# Patient Record
Sex: Male | Born: 1955 | Race: White | Hispanic: No | Marital: Married | Smoking: Never smoker
Health system: Southern US, Community
[De-identification: ages and names within clinical notes are randomized; demographics above are authoritative.]

## PROBLEM LIST (undated history)

## (undated) DIAGNOSIS — I82409 Acute embolism and thrombosis of unspecified deep veins of unspecified lower extremity: Secondary | ICD-10-CM

## (undated) DIAGNOSIS — I5042 Chronic combined systolic (congestive) and diastolic (congestive) heart failure: Secondary | ICD-10-CM

## (undated) DIAGNOSIS — Z95810 Presence of automatic (implantable) cardiac defibrillator: Secondary | ICD-10-CM

## (undated) DIAGNOSIS — J45909 Unspecified asthma, uncomplicated: Secondary | ICD-10-CM

## (undated) DIAGNOSIS — J8410 Pulmonary fibrosis, unspecified: Secondary | ICD-10-CM

## (undated) DIAGNOSIS — E119 Type 2 diabetes mellitus without complications: Secondary | ICD-10-CM

## (undated) DIAGNOSIS — I219 Acute myocardial infarction, unspecified: Secondary | ICD-10-CM

## (undated) DIAGNOSIS — J849 Interstitial pulmonary disease, unspecified: Secondary | ICD-10-CM

## (undated) DIAGNOSIS — N1832 Chronic kidney disease, stage 3b: Secondary | ICD-10-CM

## (undated) DIAGNOSIS — K5790 Diverticulosis of intestine, part unspecified, without perforation or abscess without bleeding: Secondary | ICD-10-CM

## (undated) DIAGNOSIS — F419 Anxiety disorder, unspecified: Secondary | ICD-10-CM

## (undated) DIAGNOSIS — I2510 Atherosclerotic heart disease of native coronary artery without angina pectoris: Secondary | ICD-10-CM

## (undated) DIAGNOSIS — D126 Benign neoplasm of colon, unspecified: Secondary | ICD-10-CM

## (undated) DIAGNOSIS — N189 Chronic kidney disease, unspecified: Secondary | ICD-10-CM

## (undated) DIAGNOSIS — E785 Hyperlipidemia, unspecified: Secondary | ICD-10-CM

## (undated) DIAGNOSIS — I4901 Ventricular fibrillation: Secondary | ICD-10-CM

## (undated) DIAGNOSIS — E669 Obesity, unspecified: Secondary | ICD-10-CM

## (undated) DIAGNOSIS — I10 Essential (primary) hypertension: Secondary | ICD-10-CM

## (undated) DIAGNOSIS — I509 Heart failure, unspecified: Secondary | ICD-10-CM

## (undated) HISTORY — PX: CORONARY ARTERY BYPASS GRAFT: SHX141

## (undated) HISTORY — DX: Anxiety disorder, unspecified: F41.9

## (undated) HISTORY — DX: Unspecified asthma, uncomplicated: J45.909

## (undated) HISTORY — DX: Benign neoplasm of colon, unspecified: D12.6

## (undated) HISTORY — DX: Diverticulosis of intestine, part unspecified, without perforation or abscess without bleeding: K57.90

## (undated) HISTORY — DX: Hyperlipidemia, unspecified: E78.5

## (undated) HISTORY — DX: Obesity, unspecified: E66.9

## (undated) HISTORY — PX: STERNECTOMY: SHX2443

## (undated) HISTORY — DX: Ventricular fibrillation: I49.01

## (undated) HISTORY — DX: Atherosclerotic heart disease of native coronary artery without angina pectoris: I25.10

## (undated) HISTORY — DX: Heart failure, unspecified: I50.9

## (undated) HISTORY — DX: Essential (primary) hypertension: I10

## (undated) HISTORY — DX: Acute myocardial infarction, unspecified: I21.9

---

## 2004-07-29 HISTORY — PX: CORONARY ARTERY BYPASS GRAFT: SHX141

## 2006-07-29 HISTORY — PX: MASS EXCISION: SHX2000

## 2010-07-29 DIAGNOSIS — I219 Acute myocardial infarction, unspecified: Secondary | ICD-10-CM

## 2010-07-29 HISTORY — DX: Acute myocardial infarction, unspecified: I21.9

## 2010-07-29 HISTORY — PX: IMPLANTABLE CARDIOVERTER DEFIBRILLATOR IMPLANT: SHX5860

## 2010-09-06 ENCOUNTER — Ambulatory Visit (HOSPITAL_COMMUNITY): Payer: BC Managed Care – PPO

## 2010-09-06 ENCOUNTER — Inpatient Hospital Stay (HOSPITAL_COMMUNITY): Payer: BC Managed Care – PPO

## 2010-09-06 ENCOUNTER — Inpatient Hospital Stay (HOSPITAL_COMMUNITY)
Admission: EM | Admit: 2010-09-06 | Discharge: 2010-09-12 | DRG: 549 | Disposition: A | Discharge status: Home / Self Care | Payer: BC Managed Care – PPO | Source: Ambulatory Visit | Attending: Internal Medicine | Admitting: Internal Medicine

## 2010-09-06 DIAGNOSIS — S0180XA Unspecified open wound of other part of head, initial encounter: Secondary | ICD-10-CM | POA: Diagnosis present

## 2010-09-06 DIAGNOSIS — I509 Heart failure, unspecified: Secondary | ICD-10-CM

## 2010-09-06 DIAGNOSIS — I4901 Ventricular fibrillation: Secondary | ICD-10-CM | POA: Diagnosis present

## 2010-09-06 DIAGNOSIS — G931 Anoxic brain damage, not elsewhere classified: Secondary | ICD-10-CM | POA: Diagnosis present

## 2010-09-06 DIAGNOSIS — E876 Hypokalemia: Secondary | ICD-10-CM | POA: Diagnosis present

## 2010-09-06 DIAGNOSIS — R570 Cardiogenic shock: Secondary | ICD-10-CM

## 2010-09-06 DIAGNOSIS — Z7982 Long term (current) use of aspirin: Secondary | ICD-10-CM

## 2010-09-06 DIAGNOSIS — J9600 Acute respiratory failure, unspecified whether with hypoxia or hypercapnia: Secondary | ICD-10-CM

## 2010-09-06 DIAGNOSIS — R0902 Hypoxemia: Secondary | ICD-10-CM

## 2010-09-06 DIAGNOSIS — E119 Type 2 diabetes mellitus without complications: Secondary | ICD-10-CM | POA: Diagnosis present

## 2010-09-06 DIAGNOSIS — E785 Hyperlipidemia, unspecified: Secondary | ICD-10-CM | POA: Diagnosis present

## 2010-09-06 DIAGNOSIS — Z951 Presence of aortocoronary bypass graft: Secondary | ICD-10-CM

## 2010-09-06 DIAGNOSIS — I2119 ST elevation (STEMI) myocardial infarction involving other coronary artery of inferior wall: Principal | ICD-10-CM | POA: Diagnosis present

## 2010-09-06 DIAGNOSIS — I2510 Atherosclerotic heart disease of native coronary artery without angina pectoris: Secondary | ICD-10-CM | POA: Diagnosis present

## 2010-09-06 DIAGNOSIS — I10 Essential (primary) hypertension: Secondary | ICD-10-CM | POA: Diagnosis present

## 2010-09-06 DIAGNOSIS — I469 Cardiac arrest, cause unspecified: Secondary | ICD-10-CM | POA: Diagnosis present

## 2010-09-06 DIAGNOSIS — E669 Obesity, unspecified: Secondary | ICD-10-CM | POA: Diagnosis present

## 2010-09-06 DIAGNOSIS — W19XXXA Unspecified fall, initial encounter: Secondary | ICD-10-CM | POA: Diagnosis present

## 2010-09-06 LAB — LIPID PANEL
Cholesterol: 153 mg/dL (ref 0–200)
HDL: 27 mg/dL — ABNORMAL LOW (ref >39)
LDL Cholesterol: 63 mg/dL (ref 0–99)
Triglycerides: 317 mg/dL — ABNORMAL HIGH (ref <150)

## 2010-09-06 LAB — URINE MICROSCOPIC-ADD ON

## 2010-09-06 LAB — COMPREHENSIVE METABOLIC PANEL
AST: 188 U/L — ABNORMAL HIGH (ref 0–37)
BUN: 16 mg/dL (ref 6–23)
CO2: 20 mEq/L (ref 19–32)
Calcium: 8.6 mg/dL (ref 8.4–10.5)
Chloride: 101 mEq/L (ref 96–112)
Creatinine, Ser: 1.11 mg/dL (ref 0.4–1.5)
GFR calc non Af Amer: >60 mL/min (ref >60)
Glucose, Bld: 403 mg/dL — ABNORMAL HIGH (ref 70–99)
Total Bilirubin: 1.1 mg/dL (ref 0.3–1.2)

## 2010-09-06 LAB — BASIC METABOLIC PANEL
BUN: 14 mg/dL (ref 6–23)
CO2: 18 mEq/L — ABNORMAL LOW (ref 19–32)
CO2: 18 mEq/L — ABNORMAL LOW (ref 19–32)
Calcium: 8.4 mg/dL (ref 8.4–10.5)
Calcium: 8.5 mg/dL (ref 8.4–10.5)
Chloride: 103 mEq/L (ref 96–112)
Chloride: 106 mEq/L (ref 96–112)
Creatinine, Ser: 1 mg/dL (ref 0.4–1.5)
Creatinine, Ser: 0.97 mg/dL (ref 0.4–1.5)
GFR calc Af Amer: >60 mL/min (ref >60)
Glucose, Bld: 325 mg/dL — ABNORMAL HIGH (ref 70–99)
Glucose, Bld: 335 mg/dL — ABNORMAL HIGH (ref 70–99)
Potassium: 3.8 mEq/L (ref 3.5–5.1)
Sodium: 136 mEq/L (ref 135–145)

## 2010-09-06 LAB — URINALYSIS, ROUTINE W REFLEX MICROSCOPIC
Bilirubin Urine: NEGATIVE
Protein, ur: 100 mg/dL — AB
Urine Glucose, Fasting: >1000 mg/dL — AB

## 2010-09-06 LAB — STREP PNEUMONIAE URINARY ANTIGEN: Strep Pneumo Urinary Antigen: NEGATIVE

## 2010-09-06 LAB — CBC
HCT: 39.2 % (ref 39.0–52.0)
Hemoglobin: 13.9 g/dL (ref 13.0–17.0)
MCHC: 35.5 g/dL (ref 30.0–36.0)
MCV: 82.5 fL (ref 78.0–100.0)

## 2010-09-06 LAB — PROTIME-INR
INR: 1.01 (ref 0.00–1.49)
Prothrombin Time: 13.5 seconds (ref 11.6–15.2)

## 2010-09-06 LAB — BRAIN NATRIURETIC PEPTIDE: Pro B Natriuretic peptide (BNP): <30 pg/mL (ref 0.0–100.0)

## 2010-09-06 LAB — CARDIAC PANEL(CRET KIN+CKTOT+MB+TROPI)
Relative Index: INVALID (ref 0.0–2.5)
Troponin I: 0.22 ng/mL — ABNORMAL HIGH (ref 0.00–0.06)

## 2010-09-07 ENCOUNTER — Inpatient Hospital Stay (HOSPITAL_COMMUNITY): Payer: BC Managed Care – PPO

## 2010-09-07 LAB — BASIC METABOLIC PANEL
BUN: 12 mg/dL (ref 6–23)
CO2: 17 mEq/L — ABNORMAL LOW (ref 19–32)
CO2: 18 mEq/L — ABNORMAL LOW (ref 19–32)
Calcium: 8.9 mg/dL (ref 8.4–10.5)
Calcium: 8.5 mg/dL (ref 8.4–10.5)
Chloride: 108 mEq/L (ref 96–112)
Chloride: 109 mEq/L (ref 96–112)
Chloride: 111 mEq/L (ref 96–112)
Chloride: 115 mEq/L — ABNORMAL HIGH (ref 96–112)
Creatinine, Ser: 0.5 mg/dL (ref 0.4–1.5)
Creatinine, Ser: 0.63 mg/dL (ref 0.4–1.5)
Creatinine, Ser: 0.74 mg/dL (ref 0.4–1.5)
GFR calc Af Amer: >60 mL/min (ref >60)
GFR calc Af Amer: >60 mL/min (ref >60)
GFR calc Af Amer: >60 mL/min (ref >60)
GFR calc Af Amer: >60 mL/min (ref >60)
GFR calc non Af Amer: >60 mL/min (ref >60)
GFR calc non Af Amer: >60 mL/min (ref >60)
GFR calc non Af Amer: >60 mL/min (ref >60)
Glucose, Bld: 199 mg/dL — ABNORMAL HIGH (ref 70–99)
Glucose, Bld: 215 mg/dL — ABNORMAL HIGH (ref 70–99)
Glucose, Bld: 133 mg/dL — ABNORMAL HIGH (ref 70–99)
Potassium: 3.4 mEq/L — ABNORMAL LOW (ref 3.5–5.1)
Potassium: 3.6 mEq/L (ref 3.5–5.1)
Potassium: 3.8 mEq/L (ref 3.5–5.1)
Sodium: 141 mEq/L (ref 135–145)
Sodium: 139 mEq/L (ref 135–145)
Sodium: 141 mEq/L (ref 135–145)

## 2010-09-07 LAB — POCT I-STAT 3, ART BLOOD GAS (G3+)
Acid-base deficit: 6 mmol/L — ABNORMAL HIGH (ref 0.0–2.0)
Acid-base deficit: 4 mmol/L — ABNORMAL HIGH (ref 0.0–2.0)
Bicarbonate: 20 mEq/L (ref 20.0–24.0)
Bicarbonate: 18.1 mEq/L — ABNORMAL LOW (ref 20.0–24.0)
O2 Saturation: 100 %
O2 Saturation: 100 %
O2 Saturation: 100 %
O2 Saturation: 100 %
O2 Saturation: 97 %
Patient temperature: 34.4
Patient temperature: 32.6
TCO2: 21 mmol/L (ref 0–100)
TCO2: 19 mmol/L (ref 0–100)
pCO2 arterial: 40.9 mmHg (ref 35.0–45.0)
pCO2 arterial: 32.8 mmHg — ABNORMAL LOW (ref 35.0–45.0)
pCO2 arterial: 26.4 mmHg — ABNORMAL LOW (ref 35.0–45.0)
pH, Arterial: 7.355 (ref 7.350–7.450)

## 2010-09-07 LAB — GLUCOSE, CAPILLARY
Glucose-Capillary: 247 mg/dL — ABNORMAL HIGH (ref 70–99)
Glucose-Capillary: 274 mg/dL — ABNORMAL HIGH (ref 70–99)
Glucose-Capillary: 275 mg/dL — ABNORMAL HIGH (ref 70–99)
Glucose-Capillary: 296 mg/dL — ABNORMAL HIGH (ref 70–99)
Glucose-Capillary: 317 mg/dL — ABNORMAL HIGH (ref 70–99)
Glucose-Capillary: 351 mg/dL — ABNORMAL HIGH (ref 70–99)
Glucose-Capillary: 121 mg/dL — ABNORMAL HIGH (ref 70–99)
Glucose-Capillary: 133 mg/dL — ABNORMAL HIGH (ref 70–99)
Glucose-Capillary: 156 mg/dL — ABNORMAL HIGH (ref 70–99)
Glucose-Capillary: 162 mg/dL — ABNORMAL HIGH (ref 70–99)
Glucose-Capillary: 198 mg/dL — ABNORMAL HIGH (ref 70–99)

## 2010-09-07 LAB — BLOOD GAS, ARTERIAL
MECHVT: 490 mL
O2 Saturation: 96.5 %
PEEP: 5 cmH2O
Patient temperature: 91.4
pH, Arterial: 7.57 — ABNORMAL HIGH (ref 7.350–7.450)

## 2010-09-07 LAB — POCT I-STAT, CHEM 8
Calcium, Ion: 1.19 mmol/L (ref 1.12–1.32)
Creatinine, Ser: 0.9 mg/dL (ref 0.4–1.5)
Hemoglobin: 16 g/dL (ref 13.0–17.0)
Sodium: 138 mEq/L (ref 135–145)
TCO2: 17 mmol/L (ref 0–100)

## 2010-09-07 LAB — CBC
HCT: 40.7 % (ref 39.0–52.0)
Hemoglobin: 14.5 g/dL (ref 13.0–17.0)
MCH: 29 pg (ref 26.0–34.0)
MCHC: 35.6 g/dL (ref 30.0–36.0)
RDW: 13.1 % (ref 11.5–15.5)

## 2010-09-07 LAB — HEMOGLOBIN A1C
Hgb A1c MFr Bld: 9.2 % — ABNORMAL HIGH (ref <5.7)
Mean Plasma Glucose: 217 mg/dL — ABNORMAL HIGH (ref <117)

## 2010-09-07 LAB — CARDIAC PANEL(CRET KIN+CKTOT+MB+TROPI)
CK, MB: 42.6 ng/mL (ref 0.3–4.0)
Relative Index: 16 — ABNORMAL HIGH (ref 0.0–2.5)
Total CK: 267 U/L — ABNORMAL HIGH (ref 7–232)
Total CK: 279 U/L — ABNORMAL HIGH (ref 7–232)
Troponin I: 1.43 ng/mL (ref 0.00–0.06)

## 2010-09-07 LAB — LEGIONELLA ANTIGEN, URINE

## 2010-09-07 LAB — LIPID PANEL
Triglycerides: 205 mg/dL — ABNORMAL HIGH (ref <150)
VLDL: 41 mg/dL — ABNORMAL HIGH (ref 0–40)

## 2010-09-07 LAB — MAGNESIUM: Magnesium: 1.7 mg/dL (ref 1.5–2.5)

## 2010-09-07 LAB — APTT: aPTT: 25 seconds (ref 24–37)

## 2010-09-08 ENCOUNTER — Inpatient Hospital Stay (HOSPITAL_COMMUNITY): Payer: BC Managed Care – PPO

## 2010-09-08 DIAGNOSIS — I469 Cardiac arrest, cause unspecified: Secondary | ICD-10-CM

## 2010-09-08 LAB — GLUCOSE, CAPILLARY
Glucose-Capillary: 116 mg/dL — ABNORMAL HIGH (ref 70–99)
Glucose-Capillary: 126 mg/dL — ABNORMAL HIGH (ref 70–99)
Glucose-Capillary: 129 mg/dL — ABNORMAL HIGH (ref 70–99)
Glucose-Capillary: 141 mg/dL — ABNORMAL HIGH (ref 70–99)
Glucose-Capillary: 153 mg/dL — ABNORMAL HIGH (ref 70–99)
Glucose-Capillary: 159 mg/dL — ABNORMAL HIGH (ref 70–99)
Glucose-Capillary: 159 mg/dL — ABNORMAL HIGH (ref 70–99)
Glucose-Capillary: 179 mg/dL — ABNORMAL HIGH (ref 70–99)

## 2010-09-08 LAB — BASIC METABOLIC PANEL
BUN: 10 mg/dL (ref 6–23)
BUN: 11 mg/dL (ref 6–23)
CO2: 21 mEq/L (ref 19–32)
CO2: 21 mEq/L (ref 19–32)
Calcium: 7.8 mg/dL — ABNORMAL LOW (ref 8.4–10.5)
Chloride: 114 mEq/L — ABNORMAL HIGH (ref 96–112)
Chloride: 115 mEq/L — ABNORMAL HIGH (ref 96–112)
Chloride: 116 mEq/L — ABNORMAL HIGH (ref 96–112)
Chloride: 117 mEq/L — ABNORMAL HIGH (ref 96–112)
Creatinine, Ser: 0.76 mg/dL (ref 0.4–1.5)
GFR calc Af Amer: >60 mL/min (ref >60)
GFR calc Af Amer: >60 mL/min (ref >60)
Glucose, Bld: 174 mg/dL — ABNORMAL HIGH (ref 70–99)
Potassium: 3.3 mEq/L — ABNORMAL LOW (ref 3.5–5.1)
Potassium: 4.3 mEq/L (ref 3.5–5.1)
Sodium: 143 mEq/L (ref 135–145)
Sodium: 143 mEq/L (ref 135–145)
Sodium: 143 mEq/L (ref 135–145)

## 2010-09-08 LAB — CBC
Hemoglobin: 12.5 g/dL — ABNORMAL LOW (ref 13.0–17.0)
RBC: 4.39 MIL/uL (ref 4.22–5.81)

## 2010-09-08 LAB — URINE CULTURE: Culture  Setup Time: 201202100417

## 2010-09-08 LAB — CARDIAC PANEL(CRET KIN+CKTOT+MB+TROPI)
CK, MB: 25.3 ng/mL (ref 0.3–4.0)
Total CK: 136 U/L (ref 7–232)

## 2010-09-08 LAB — POCT I-STAT 3, ART BLOOD GAS (G3+)
pCO2 arterial: 33.2 mmHg — ABNORMAL LOW (ref 35.0–45.0)
pO2, Arterial: 105 mmHg — ABNORMAL HIGH (ref 80.0–100.0)

## 2010-09-09 ENCOUNTER — Inpatient Hospital Stay (HOSPITAL_COMMUNITY): Payer: BC Managed Care – PPO

## 2010-09-09 LAB — GLUCOSE, CAPILLARY
Glucose-Capillary: 134 mg/dL — ABNORMAL HIGH (ref 70–99)
Glucose-Capillary: 123 mg/dL — ABNORMAL HIGH (ref 70–99)

## 2010-09-09 LAB — CBC
HCT: 32.9 % — ABNORMAL LOW (ref 39.0–52.0)
MCH: 28.1 pg (ref 26.0–34.0)
MCHC: 32.8 g/dL (ref 30.0–36.0)
MCV: 85.5 fL (ref 78.0–100.0)
Platelets: 170 10*3/uL (ref 150–400)
RDW: 13.8 % (ref 11.5–15.5)
WBC: 8.8 10*3/uL (ref 4.0–10.5)

## 2010-09-09 LAB — CULTURE, RESPIRATORY W GRAM STAIN

## 2010-09-09 LAB — POCT I-STAT 3, VENOUS BLOOD GAS (G3P V)
Acid-base deficit: 1 mmol/L (ref 0.0–2.0)
O2 Saturation: 96 %
TCO2: 27 mmol/L (ref 0–100)
pCO2, Ven: 45.7 mmHg (ref 45.0–50.0)
pO2, Ven: 83 mmHg — ABNORMAL HIGH (ref 30.0–45.0)

## 2010-09-09 LAB — POCT I-STAT 3, ART BLOOD GAS (G3+)
Acid-base deficit: 2 mmol/L (ref 0.0–2.0)
O2 Saturation: 96 %
Patient temperature: 98.6

## 2010-09-09 LAB — MAGNESIUM: Magnesium: 1.6 mg/dL (ref 1.5–2.5)

## 2010-09-09 LAB — BASIC METABOLIC PANEL
BUN: 8 mg/dL (ref 6–23)
Creatinine, Ser: 0.72 mg/dL (ref 0.4–1.5)
GFR calc non Af Amer: >60 mL/min (ref >60)
Glucose, Bld: 144 mg/dL — ABNORMAL HIGH (ref 70–99)

## 2010-09-09 LAB — CULTURE, BLOOD (ROUTINE X 2): Culture  Setup Time: 201202100216

## 2010-09-10 LAB — MAGNESIUM: Magnesium: 1.6 mg/dL (ref 1.5–2.5)

## 2010-09-10 LAB — CBC
MCH: 28.3 pg (ref 26.0–34.0)
Platelets: 169 10*3/uL (ref 150–400)
RBC: 4 MIL/uL — ABNORMAL LOW (ref 4.22–5.81)
RDW: 13.1 % (ref 11.5–15.5)
WBC: 9 10*3/uL (ref 4.0–10.5)

## 2010-09-10 LAB — PHOSPHORUS: Phosphorus: 3.2 mg/dL (ref 2.3–4.6)

## 2010-09-10 LAB — BASIC METABOLIC PANEL
BUN: 5 mg/dL — ABNORMAL LOW (ref 6–23)
Chloride: 109 mEq/L (ref 96–112)
Creatinine, Ser: 0.84 mg/dL (ref 0.4–1.5)
GFR calc Af Amer: >60 mL/min (ref >60)
GFR calc non Af Amer: >60 mL/min (ref >60)

## 2010-09-10 LAB — GLUCOSE, CAPILLARY: Glucose-Capillary: 96 mg/dL (ref 70–99)

## 2010-09-11 LAB — BASIC METABOLIC PANEL
BUN: 9 mg/dL (ref 6–23)
CO2: 25 mEq/L (ref 19–32)
Chloride: 108 mEq/L (ref 96–112)
Creatinine, Ser: 0.98 mg/dL (ref 0.4–1.5)
Glucose, Bld: 184 mg/dL — ABNORMAL HIGH (ref 70–99)
Potassium: 4.3 mEq/L (ref 3.5–5.1)

## 2010-09-11 LAB — GLUCOSE, CAPILLARY
Glucose-Capillary: 168 mg/dL — ABNORMAL HIGH (ref 70–99)
Glucose-Capillary: 155 mg/dL — ABNORMAL HIGH (ref 70–99)
Glucose-Capillary: 164 mg/dL — ABNORMAL HIGH (ref 70–99)

## 2010-09-11 LAB — CBC
HCT: 35.2 % — ABNORMAL LOW (ref 39.0–52.0)
Hemoglobin: 11.7 g/dL — ABNORMAL LOW (ref 13.0–17.0)
MCH: 27.7 pg (ref 26.0–34.0)
MCV: 83.4 fL (ref 78.0–100.0)
Platelets: 220 10*3/uL (ref 150–400)
RBC: 4.22 MIL/uL (ref 4.22–5.81)
WBC: 11.2 10*3/uL — ABNORMAL HIGH (ref 4.0–10.5)

## 2010-09-11 LAB — SURGICAL PCR SCREEN: MRSA, PCR: NEGATIVE

## 2010-09-12 ENCOUNTER — Inpatient Hospital Stay (HOSPITAL_COMMUNITY): Payer: BC Managed Care – PPO

## 2010-09-13 LAB — CULTURE, BLOOD (ROUTINE X 2)

## 2010-09-13 NOTE — Discharge Summary (Addendum)
Eugene [name], Eugene [name] NO.:  [id number]  MEDICAL RECORD NO.:  [id number]           PATIENT TYPE:  I  LOCATION:  3706                         FACILITY:  [facility]  PHYSICIAN:  Eugene Mungo. Lovena Le, MD    DATE OF BIRTH:  [birth year]  DATE OF ADMISSION:  09/06/2010 DATE OF DISCHARGE:  09/12/2010                              DISCHARGE SUMMARY   PRIMARY CARDIOLOGIST:  The patient previously saw [facility], but would prefer to follow up with [facility] at discharge.  PRIMARY CARE PROVIDER:  Dr. Raelene [name] in [place]: 1. Ventricular fibrillation arrest status post St. Jude single-chamber     implantable cardioverter-defibrillator implanted, September 11, 2010. 2. Inferior ST-elevation myocardial infarction, September 06, 2010, with     peak troponin of 1.43 with cardiac catheterization showing a     totally occluded saphenous vein graft to the right coronary artery     and patent saphenous vein graft to the distal circumflex, patent     left internal mammary artery to left anterior descending, diffuse     native coronary artery disease.  There was a 70% lesion distally in     the saphenous vein graft to the ramus intermedius branch which was     felt initially to be possibly acute.  Attempts were made to     recanalize the occluded right coronary artery graft but were     unsuccessful, and this lesion was felt to be more chronic. 3. Coronary artery disease status post coronary artery bypass graft. 4. Left ventricular ejection fraction was 70% by cath on September 06, 2010. 5. Obesity. 6. Hypertension. 7. Diabetes. 8. Facial lacerations, felt secondary to cardiac arrest, CT of the     head and C-spine negative for fracture or acute bleed.  HOSPITAL COURSE:  Eugene [name] is a 55 year old gentleman with prior history of coronary artery disease and diabetes who was brought in by EMS with an acute ST-elevation MI in the inferior  leads.  He was found unresponsive apparently at [employer] and was down for about 6 minutes with no CPR.  EMS found him in V-fib and gave him two shocks, and the patient went into asystole when CPR was restarted.  After brief period of CPR, the patient regained his pulse and the rhythm was found to be in normal sinus rhythm, but he was having inferior ST-elevation.  Code STEMI was called, the patient was brought to the cath lab.  The patient was intubated on the way.  He was also bleeding from his mouth and has some facial lacerations.  No crepitus or displaced fractures were noted by clinical exam.  Under the circumstances of the head trauma, Eugene [name] who performed the initial catheterization was concerned about high-dose anticoagulant therapy.  Therefore, half-dose heparin was used which could be easily reversed if needed.  Cardiac catheterization findings are noted as above.  The SVG to the RCA was totally occluded and unable to be recanalized.  SVG to the ramus intermedius branch had 70% lesion.  SVG to the distal circumflex was patent and the LIMA to the LAD was patent.  He did have severe native disease as well.  His LV function was normal with an EF of 70% by LV gram with no obvious wall motion abnormalities.  He was seen in consultation by the Pulmonary Critical Care Service and Eugene [name] protocol was initiated to cool the patient. He was pancultured and there was initial concern for aspiration pneumonia, so Unasyn and vancomycin were ordered.  Initial blood cultures grew out Gram-positive cocci in the form of coag-negative staph in only one of the two blood cultures, and the patient remained afebrile.  Therefore, this was felt secondary to contamination by Dr. Lovena [name].  The patient began to be rewarmed on September 07, 2010, after which his neurologic status was assessed.  He was also hypokalemic throughout this hospitalization and received potassium repletion.  The patient made  remarkable neurologic recovery and given his ventricular tachycardia, he was seen in consultation by electrophysiologist, Dr. Lovena [name], for consideration of AICD placement.  He underwent this procedure on September 11, 2010, and subsequently had a St. Jude single- chamber defibrillator implanted.  The patient did well postprocedurally. Day after the procedure, parameters were functioning normally.  Dr. Lovena [name] has seen and examined him today and feels he is stable for discharge.  DISCHARGE LABORATORY DATA:  Total cholesterol 153, triglycerides 205, HDL 31, LDL 81.  WBC 11.2, hematocrit 35.2, hemoglobin 11.7, platelet count 220.  Sodium 139, potassium 4.3, chloride 108, CO2 is 25, glucose 184, BUN 9, creatinine 0.98.  Magnesium was 1.6 on September 10, 2010, and the patient received 4 g of mag sulfate.  STUDIES: 1. Chest x-ray, September 12, 2010, showed stable cardiomegaly.  Pacer     present with AICD lead.  No pneumothorax. 2. CT of the head without contrast as well as C-spine showed no acute     findings.  Negative for fracture.  Endotracheal tube was just above     the carina.  Spondylosis was most notable at C5-C6. 1. Cardiac catheterization, September 06, 2010, please see full report     for details as well as HPI for summary.  DISCHARGE MEDICATIONS: 1. Aspirin 81 mg daily. 2. Metoprolol 50 mg b.i.d. 3. Nitroglycerin 0.4 mg every 5 minutes as needed up to three doses. 4. Crestor 20 mg nightly. 5. Lantus SoloSTAR Pen 65 units subcutaneously daily. 6. Metformin 500 mg one to two tablets b.i.d. based on home dosing     regimen. 7. Ramipril 2.5 mg daily. 8. Vitamin D 50,000 units 1 capsule weekly.  DISPOSITION:  Eugene [name] will be discharged in stable condition to home. He is not to return to work until cleared by his cardiologist.  He is to increase activity slowly and not to drive for 6 months.  He is to follow a low-sodium heart-healthy diabetic diet.  He was given  the defibrillator discharge instruction sheet with specific active instructions regarding wound care, activity, and bathing.  He will follow up with the [facility] on September 20, 2010, at 2:30 p.m.  He will follow up with Dr. Lovena [name] for his ICD check on Dec 11, 2010, at 2:00 p.m. and follow up with Dr. Harrington Challenger on September 27, 2010, at 11:45 a.m.  DURATION OF DISCHARGE ENCOUNTER:  Greater than 30 minutes including physician and PA time.     Dayna Dunn, P.A.C.   ______________________________ Eugene Mungo. Lovena Le, MD    DD/MEDQ  D:  09/12/2010  T:  09/12/2010  Job:  [id number]  cc:   Dr. Liston Alba.  Electronically Signed by Cristopher Peru MD on 09/13/2010 05:46:41 PM Electronically Signed by Melina Copa  on 09/19/2010 09:12:22 AM

## 2010-09-13 NOTE — Op Note (Addendum)
NAMEKOURY, Eugene [name] NO.:  [id number]  MEDICAL RECORD NO.:  [id number]           PATIENT TYPE:  LOCATION:                                 FACILITY:  PHYSICIAN:  Champ Mungo. Lovena Le, MD    DATE OF BIRTH:  [birth year]  DATE OF PROCEDURE:  09/11/2010 DATE OF DISCHARGE:                              OPERATIVE REPORT   PROCEDURE PERFORMED:  Insertion of a single-chamber defibrillator.  INDICATIONS:  Status post resuscitative VF arrest with no reversible etiology.  INTRODUCTION:  The patient is a 55 year old male with a history of coronary disease status post bypass surgery.  He has obesity and hypertension.  He has diabetes.  He was in his usual state of health when is at work.  He had syncope and sustained a cardiac arrest.  His initial presenting rhythm was ventricular fibrillation.  He was successfully defibrillated and cooled.  He underwent catheterization which demonstrated no obvious culprit lesions.  His vein grafts were patent.  His EF is minimally depressed.  He is now referred for ICD implantation for secondary prevention of malignant ventricular arrhythmias.  PROCEDURE:  After informed consent was obtained, the patient was taken to the diagnostic EP lab in the fasting state.  After the usual preparation and draping, intravenous fentanyl and midazolam was given for sedation.  Lidocaine 30 mL was infiltrated into the left infraclavicular region.  A 5-cm incision was carried out over this region.  Electrocautery was utilized to dissect down to the fascial plane.  The left subclavian vein was then punctured and the St. Jude model 7122 65-cm active fixation defibrillation lead, serial number [id number], was advanced into the right ventricle.  Mapping was carried out which demonstrated the ventricular lead in the RV apical septum. The R-waves measured 8 mV, the pacing impedance was 500 ohms, and the threshold with the lead actively fixed was less than a volt at  0.5 msec. A 10-V pacing did not stimulate the diaphragm.  With the ventricular lead in satisfactory position, it was secured to the subpectoralis fascia with a figure-of-eight silk suture and the sewing sleeve was secured with silk suture.  Electrocautery was then utilized to make a subcutaneous pocket.  Antibiotic irrigation was then utilized to irrigate the pocket and electrocautery was utilized to assure hemostasis.  The St. Jude single-chamber defibrillator, serial number [id number], was connected to the defibrillation lead and placed back in the subcutaneous pocket.  The pocket was again irrigated with antibiotic irrigation and defibrillation threshold testing was carried out.  After the patient was more deeply sedated with fentanyl and Versed, VF was induced with a T-wave shock.  A 20-joule shock was subsequently delivered which terminated VF and restored sinus rhythm.  At this point, no additional defibrillation threshold testing was carried out and the incision was closed with 2-0 and 3-0 Vicryl.  Benzoin and Steri-Strips were painted on the skin.  A pressure dressing was applied, and the patient was returned to his room in satisfactory condition.  COMPLICATIONS:  There were no immediate procedure complications.  RESULTS:  This demonstrate successful implantation of the St. Jude  single-chamber defibrillator in a patient with ischemic heart disease status post resuscitated VF arrest.     Champ Mungo. Lovena Le, MD     GWT/MEDQ  D:  09/11/2010  T:  09/12/2010  Job:  [id number]  cc:   Nani Skillern.  Electronically Signed by Cristopher Peru MD on 09/13/2010 05:47:53 PM

## 2010-09-16 HISTORY — PX: CARDIAC CATHETERIZATION: SHX172

## 2010-09-20 ENCOUNTER — Encounter: Payer: Self-pay | Admitting: Internal Medicine

## 2010-09-20 ENCOUNTER — Ambulatory Visit: Payer: BC Managed Care – PPO

## 2010-09-25 NOTE — Miscellaneous (Signed)
Summary: Device preload  Clinical Lists Changes  Observations: Added new observation of ICD INDICATN: VF ARREST (09/20/2010 8:54) Added new observation of ICDLEADSTAT1: active (09/20/2010 8:54) Added new observation of ICDLEADSER1: [id number] (09/20/2010 8:54) Added new observation of ICDLEADMOD1: [id number]  (09/20/2010 8:54) Added new observation of ICDLEADLOC1: RV  (09/20/2010 8:54) Added new observation of ICD IMP MD: Eugene Peru, MD  (09/20/2010 8:54) Added new observation of ICDLEADDOI1: 09/11/2010  (09/20/2010 8:54) Added new observation of ICD IMPL DTE: 09/11/2010  (09/20/2010 8:54) Added new observation of ICD SERL#: [id number]  (09/20/2010 8:54) Added new observation of ICD MODL#: [id number]  (09/20/2010 8:54) Added new observation of ICDMANUFACTR: St Jude  (09/20/2010 8:54) Added new observation of ICD MD: Eugene Peru, MD  (09/20/2010 8:54)       ICD Specifications Following MD:  Eugene Peru, MD     ICD Vendor:  St Jude     ICD Model Number:  [id number]     ICD Serial Number:  [id number] ICD DOI:  09/11/2010     ICD Implanting MD:  Eugene Peru, MD  Lead 1:    Location: RV     DOI: 09/11/2010     Model #: [id number]     Serial #: [id number]     Status: active  Indications::  VF ARREST

## 2010-09-27 ENCOUNTER — Encounter (INDEPENDENT_AMBULATORY_CARE_PROVIDER_SITE_OTHER): Payer: BC Managed Care – PPO

## 2010-09-27 ENCOUNTER — Encounter (INDEPENDENT_AMBULATORY_CARE_PROVIDER_SITE_OTHER): Payer: BC Managed Care – PPO | Admitting: Internal Medicine

## 2010-09-27 ENCOUNTER — Encounter: Payer: Self-pay | Admitting: Internal Medicine

## 2010-09-27 ENCOUNTER — Ambulatory Visit (INDEPENDENT_AMBULATORY_CARE_PROVIDER_SITE_OTHER): Payer: BC Managed Care – PPO

## 2010-09-27 ENCOUNTER — Telehealth (INDEPENDENT_AMBULATORY_CARE_PROVIDER_SITE_OTHER): Payer: Self-pay | Admitting: *Deleted

## 2010-09-27 DIAGNOSIS — E785 Hyperlipidemia, unspecified: Secondary | ICD-10-CM | POA: Insufficient documentation

## 2010-09-27 DIAGNOSIS — R0989 Other specified symptoms and signs involving the circulatory and respiratory systems: Secondary | ICD-10-CM

## 2010-09-27 DIAGNOSIS — E7800 Pure hypercholesterolemia, unspecified: Secondary | ICD-10-CM

## 2010-09-27 DIAGNOSIS — I10 Essential (primary) hypertension: Secondary | ICD-10-CM | POA: Insufficient documentation

## 2010-09-27 DIAGNOSIS — I2510 Atherosclerotic heart disease of native coronary artery without angina pectoris: Secondary | ICD-10-CM | POA: Insufficient documentation

## 2010-09-27 DIAGNOSIS — I2589 Other forms of chronic ischemic heart disease: Secondary | ICD-10-CM

## 2010-09-27 DIAGNOSIS — I4901 Ventricular fibrillation: Secondary | ICD-10-CM | POA: Insufficient documentation

## 2010-10-04 NOTE — Assessment & Plan Note (Signed)
Summary: eph/per dayna   CC:  new post hospital.  History of Present Illness: patient is a 55 year old who was recently discharged from [facility] He was found down at a [employer].  EMS was activated.  There appeard to be a down time of about 6 minutes with no CPR by report.  He was defib to asystole the SR with inferior ST elevation.  He was brought to [facility] There he underwent catheterization.  This  showied a  occluded saphenous vein graft to the right coronary artery and patent saphenous vein graft to the distal circumflex, patent left internal mammary artery to left anterior descending, diffuse native coronary artery disease.  There was a 70% lesion distally ino the saphenous vein graft to the ramus intermedius branch which was felt initially to be possibly acute.  Attempts were made to recanalize the occluded right coronary artery graft but were  unsuccessful, and this lesion was felt to be more chronic.  The patient underwent AICD placement prior to D/C.  This is is post D/C visit. Note that he was previously followed by [facility]. The patent denies chest pain.  No dizziness.  No palpitations.  He did not have any prior to the event. He does not remember the time immed before or for a few days after. He does note that a cyst on his chest is bothering him.  Had be removed in past at time of sternectomy NOw can express some fluid that is foul smelling.  Current Medications (verified): 1)  Aspirin 81 Mg Tabs (Aspirin) .... Take One Tablet Once Daily 2)  Metoprolol Tartrate 50 Mg Tabs (Metoprolol Tartrate) .... Take One Tablet Two Times A Day 3)  Crestor 20 Mg Tabs (Rosuvastatin Calcium) .... Take One Tablet Once Daily 4)  Metformin Hcl 500 Mg Tabs (Metformin Hcl) .... Take One Tablet Two Times A Day 5)  Ramipril 2.5 Mg Caps (Ramipril) .... Take One Capsule Once Daily 6)  Vitamin D (Ergocalciferol) 50000 Unit Caps (Ergocalciferol) .... Take One Capsule Weekly 7)   Nitrostat 0.4 Mg Subl (Nitroglycerin) .... Place One Tablet Under Tongue As Needed For Chest Pain, May Repeat Up To 3 Times in 15 Minutes. 8)  Lantus Solostar 100 Unit/ml Soln (Insulin Glargine) .... 65 Units Daily 9)  Apidra 100 Unit/ml Soln (Insulin Glulisine) .... Take On A Sliding Scale  Allergies (verified): No Known Drug Allergies  Past History:  Past medical, surgical, family and social histories (including risk factors) reviewed, and no changes noted (except as noted below).  Past Medical History: CAD VF arrest DIabetes Hypertension Dyslipidemia Obesity  Past Surgical History: CABG Sternectomy  Family History: Reviewed history and no changes required.  Social History: Reviewed history and no changes required. Married. No tobacco.  Review of Systems       All systems reviewed.  Neg to the above prlbem except as noted above.  Vital Signs:  Patient profile:   55 year old male Height:      67 inches Weight:      218.4 pounds BMI:     34.33 Pulse rate:   63 / minute Pulse rhythm:   regular BP sitting:   140 / 70  (left arm) Cuff size:   large  Vitals Entered By: Doug Sou CMA (September 27, 2010 11:23 AM)  Physical Exam  Additional Exam:  Patient is in NAD HEENT:  Normocephalic, atraumatic. EOMI, PERRLA.  Neck: JVP is normal. No thyromegaly. No bruits.  Lungs:  clear to auscultation. No rales no wheezes.  Chest:  s/p sternectomy.  Erythematous nodule along L side of incision.   AICD site looks good. Heart: Regular rate and rhythm. Normal S1, S2. No S3.   No significant murmurs. PMI not displaced.  Abdomen:  Supple, nontender. Normal bowel sounds. No masses. No hepatomegaly.  Extremities:   Good distal pulses throughout. No lower extremity edema.  Musculoskeletal :moving all extremities.  Neuro:   alert and oriented x3.    EKG  Procedure date:  09/27/2010  Findings:      NSR.  INcomp RBBB.   ICD Specifications Following MD:  Cristopher Peru, MD      ICD Vendor:  [facility] Jude     ICD Model Number:  [id number]     ICD Serial Number:  [id number] ICD DOI:  09/11/2010     ICD Implanting MD:  Cristopher Peru, MD  Lead 1:    Location: RV     DOI: 09/11/2010     Model #: [id number]     Serial #: [id number]     Status: active  Indications::  VF ARREST   Impression & Recommendations:  Problem # 1:  VENTRICULAR FIBRILLATION (ICD-427.41) Patient has recovered remarkably from event.  No obvious neuro sequelae other than short memory loss. AICD site looks good.  Will f/u with G. Filbert Berthold Allred saw patient.  Discussed cyst.  Patient should not touch this and ICD area Can go see surgeon with plans to remove in about 2 wks. at the earliest.  AICD needs time to heal prior.  Problem # 2:  CAD, NATIVE VESSEL (ICD-414.01) Cath as noted.  Not revascularizable. Continue medical Rx.  Look to rehab.  Part of analyze ST study.  Problem # 3:  HYPERTENSION, BENIGN (ICD-401.1) Continue meds.  Fair control  Problem # 4:  HYPERLIPIDEMIA-MIXED (B2193296.4) Will need to have lipids checked.  Patient Instructions: 1)  Your physician wants you to follow-up in: August 2012 with Dr.Devario Bucklew  You will receive a reminder letter in the mail two months in advance. If you don't receive a letter, please call our office to schedule the follow-up appointment.

## 2010-10-04 NOTE — Progress Notes (Addendum)
  Request received from [place] sent to [place]  September 27, 2010 12:20 PM     Appended Document:  Pt Dropped off FMLA papers,completed Packet sent to [place]:  [employer] paper received ( given to me by Claiborne Billings) sent to [employer] for completion?

## 2010-10-05 NOTE — Procedures (Signed)
NAME:  Eugene [name], Eugene [name] NO.:  [id number]  MEDICAL RECORD NO.:  [id number]           PATIENT TYPE:  I  LOCATION:  2902                         FACILITY:  [facility]  PHYSICIAN:  Belva Crome, M.D.   DATE OF BIRTH:  Jan 15, 1956  DATE OF PROCEDURE:  09/06/2010 DATE OF DISCHARGE:                           CARDIAC CATHETERIZATION   REASON FOR PROCEDURE:  Ventricular fibrillation associated cardiac arrest with resuscitation in the field after defibrillation x2.  The patient was intubated.  He has significant facial lacerations that occurred secondary to head trauma resulting from syncope.  He was unable to give a history when he arrived in the Cath Lab.  Initial EKGs after resuscitation demonstrated inferior ST elevation of 0.5 to 1 mm.  He had had previous bypass surgery based upon his sternal scar from previous sternotomy.  It was felt that emergency catheterization was indicated on these circumstances.  PROCEDURES PERFORMED: 1. Left heart catheterization. 2. Selective coronary angiography. 3. Left ventriculography by hand injection. 4. Saphenous vein graft angiography. 5. Internal mammary graft angiography. 6. Attempted percutaneous transluminal coronary angioplasty of     saphenous vein graft to the right coronary artery, unable to cross.  DESCRIPTION:  On the emergency circumstances, the patient was brought by EMS.  He immediately to the Cath Lab by passing the emergency room.  The patient was in a break room at his place of work when he suddenly collapsed.  According to a coworker, he went down face first and started having significant facial bleeding.  Her initial assessment was that the patient was not bleeding.  EMS was called.  She feels that they arrived within 6 or 7 minutes.  No one performed CPR or mouth-to-mouth.  He then underwent ACLS with defibrillation and ultimately intubation.  When he was brought to the Cath Lab, he was intubated and having  movement of his upper extremities.  No motion in the lower extremities was noted.  He did not have a neck collar.  A neck collar was placed.  He was transported to the cath table.  The facial lacerations and cranium were examined.  No crepitus or displacement fractures were noted by clinical exam.  We did notice that the patient was able to move his legs and arms.  There were no activities basis upon command.  We proceeded with emergency catheterization.  A 6-French sheath was placed in the right femoral artery and 7-French sheath in the right femoral vein.  Both were placed using the modified Seldinger technique. We then used a 6-French A2 multipurpose catheter for hemodynamic recordings, left ventriculography by hand injection, native right coronary angiography, and saphenous vein graft angiography.  We used internal mammary catheter for left internal mammary artery angiography, which arose from an unusual more distal location from the subclavian. We used a 6-French #4 left Judkins catheter for left coronary angiography.  We used a right bypass graft catheter for right coronary saphenous vein graft angiography.  After reviewing the anatomy and ventriculogram, we felt that possibly the right coronary artery graft was the acute lesion.  Under the circumstances with head  trauma, I was concerned about using high dose anticoagulant therapy.  We therefore used half-dose heparin therapy, an agent that we could easily reverse. We then attempted to cross the stenosis in the saphenous vein graft to the right coronary artery.  This graft by the way did not demonstrate any contrast staining and multiple attempts with a wire were unable to cross.  My impression was that this was more of a chronic lesion.  There were collaterals to the inferior wall from the native left coronary artery and also right to right collaterals.  We discontinued attempts to perform angioplasty.  We consider giving  protamine, although the patient's initial ACT was 180 seconds and a repeat within 10 minutes was 156 seconds.  We then decided to have the patient go to the CT scanner to exclude significant intracranial bleeding.  We contacted Critical Care Medicine to have the patient placed on the [facility] protocol pending his head CT.  I spoke with family members and explained the patient's critical condition.  He apparently is not married, but has three sisters, several coworkers, and nieces present.  RESULTS: 1. Hemodynamic data:     a.     Aortic pressure 100/59 mmHg.     b.     Left ventricular pressure 98/ 6 mmHg. 2. Left ventriculography:  LV function is entirely normal and     hyperdynamic.  EF is 70%.  No obvious wall motion abnormality is     noted. 3. Coronary angiography:     a.     Left main coronary artery:  Distal 60% left main narrowing.     b.     Left anterior descending coronary artery:  The left anterior      descending coronary artery is totally occluded proximally.     c.     Circumflex artery:  The circumflex artery in its branches      are highly diseased.  Obtuse marginal branches are totally      occluded.  There are distal collaterals to the right coronary      artery territory.     d.     Ramus intermedius branch:  Highly diseased followed by 95%      stenosis and then total occlusion with some evidence of      competitive flow in one of the branches of the ramus.     e.     Right coronary artery:  The right coronary artery is totally      occluded proximally with right to right collaterals. 4. Saphenous vein graft angiography:     a.     Saphenous vein graft to the circumflex:  This graft is      widely patent.     b.     Saphenous vein graft to the ramus/diagonal:  This graft is      patent with a lucent 70% stenosis in the distal graft body.  The      vessel distal to this graft is diffusely diseased and relatively      small.     c.     Saphenous vein graft  to the right coronary artery:  The      graft is totally occluded proximally.  There is no contrast      staining. 5. Left internal mammary graft to the LAD:  This graft is widely     patent.  It attached to the mid-LAD.  The LAD proximal and distal  to the graft is severely and diffusely diseased.  The native LAD     gives collaterals to the inferior wall. 6. PCI of the saphenous vein graft to the right coronary artery:     Because of initial evidence of inferior ST elevation, we briefly     attempted to recanalize the totally occluded right coronary artery     graft.  Wire would not pass the stenosis.  The lesion in this     vessel seem to be more chronic and not acute.  At this point, we     repeated a 12-lead EKG and there was no ST elevation and we decided     to terminate the case rather than give additional anticoagulant     therapy around the risk of worsening any intracranial bleeding if     present.  CONCLUSIONS: 1. Out of hospital ventricular fibrillation arrest. 2. Severe diffuse native vessel and bypass graft occlusive disease.     a.     Saphenous vein graft to the right coronary artery is totally      occluded.     b.     Saphenous vein graft to the ramus intermedius branch has a      distal 70% lesion that is possibly acute.  c.     The saphenous vein graft to the distal circumflex is patent. 3. Severe diffuse native vessel coronary artery disease with diffuse     disease in the LAD despite a patent left internal mammary graft.     Total occlusion of obtuse marginal branches.  Total occlusion of     the ramus branch.  Total occlusion of the native right coronary     artery.  The distal right coronary artery territory is supplied by     collaterals. 4. Widely patent LIMA to LAD. 5. Normal LV function.  PLAN: 1. Critical Care Medicine has been contacted. 2. [facility] will follow the patient as they are responsible     for ER cases today and I was helping  out due to overflow from     several STEMIs coming in at once. 3. CT scan of the head to rule out intracranial bleeding. 4. Su Hilt if appropriate.     Belva Crome, M.D.     HWS/MEDQ  D:  09/06/2010  T:  09/07/2010  Job:  [id number]  cc:   [facility]  Electronically Signed by Daneen Schick M.D. on 10/04/2010 09:27:36 AM

## 2010-10-08 ENCOUNTER — Telehealth: Payer: Self-pay | Admitting: Internal Medicine

## 2010-10-10 NOTE — Consult Note (Signed)
NAMEANJAY, KOKAL NO.:  [id number]  MEDICAL RECORD NO.:  [id number]           PATIENT TYPE:  O  LOCATION:  [facility]                         FACILITY:  [facility]  PHYSICIAN:  Champ Mungo. Lovena Le, MD    DATE OF BIRTH:  [birth year]  DATE OF CONSULTATION: 09/12/2010 DATE OF DISCHARGE:                                CONSULTATION   CARDIOLOGIST:  Primary is unknown at this point of time.  PRIMARY PHYSICIAN:  Unknown.  CHIEF COMPLAINT:  Vfib arrest with a down time of about 6 minutes.  HISTORY OF PRESENT ILLNESS:  The patient is a 55 year old man with known diabetes type 2 and prior history of coronary artery disease status post CABG, was brought in by EMS with an acute ST-elevation MI in the inferior leads.  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 EMS was called.  The patient was almost down for 6 minutes with no CPR.  EMS found him in V-fib and gave him a 200joules shock, repeated the  shock with 200 joules and the patient went into asystole then CPR was started.  After a brief period of CPR, the patient regained his pulse and the rhythm was found to be normal sinus rhythm, but the patient was having inferior lead ST-elevation changes which was suggestive of acute ST elevation MI.  ED was called in and code STEMI was called and the patient was brought directly to the cath lab.  The patient was intubated on his way.  The patient also was bleeding from his mouth because of some injury on his face from the fall after the Vfib arrest.  PAST MEDICAL HISTORY:  Diabetes type 2, coronary artery disease status post CABG, other prior medical history is unobtainable.  PAST SURGICAL HISTORY:  Coronary artery bypass grafting unknown time ago.  SOCIAL HISTORY:  Unobtainable.  FAMILY HISTORY:  Unobtainable.  REVIEW OF SYSTEMS:  Unobtainable.  He is full code at this time.  PRIOR MEDICATIONS:  Unknown.  ALLERGIES:   Unknown.  PHYSICAL EXAMINATION:  VITAL SIGNS:  Pulse 123 per minute, respiratory rate 21 per minute, blood pressure of 230/134, weight 125 kg, saturation 100% on ventilator, temperature unknown. GENERAL:  The patient was intubated and sedated. HEENT:  The patient was normocephalic and atraumatic with no external wounds seen on the physical exam.  The patient did have bleeding from inside his mouth, which could not be appreciated because of the intubation.  Pupils were equally round and reactive to light. Extraocular movements intact.  Sclera was clear. NECK:  Supple with no bruits.  No jugular venous distention.  No lymphadenopathy. CARDIOVASCULAR:  Regular rate and rhythm, tachycardic.  No murmurs, rubs, or gallops were heard.  S1 and S2 normal.  Pulses 2+ bilaterally, equal on both arms. LUNGS:  Bilateral rhonchi were heard anteriorly throughout the lung field.  The patient was on ventilator.  No wheezing was heard. SKIN:  No rashes or lesions. ABDOMEN:  Soft, nontender.  No rebound or guarding.  No hepatosplenomegaly. NEUROLOGIC:  Could not be assessed as the patient  was intubated and sedated.  RADIOLOGICAL DATA:  No images or procedures were done.  EKG, heart rate of 120 sinus rhythm.  The patient had ST elevation in lead III and aVF, which was more than 2 mm.  This is the EKG at the time of transportation.  The EKG done in the cath lab was normal sinus rhythm without any signs of ischemia.  Labs done on i-STAT, hemoglobin of 16, hematocrit of 47.0, sodium 138, potassium 3.2, BUN 17, creatinine 0.9, glucose of 427.  Assesment and plan:  Patient was taken to cath lab and Dr Daneen Schick did the cath on him but couldnt find any blockages. It was decided to take him to CCU and PCCM was consulted at this point of time for initiating the hypothermia protocol and help with ventilator  management.    Janell Quiet, MD   ______________________________ Champ Mungo. Lovena Le,  MD    AG/MEDQ  D:  09/06/2010  T:  09/07/2010  Job:  [id number]  Electronically Signed by Janell Quiet  on 09/28/2010 03:48:20 PM Electronically Signed by Cristopher Peru MD on 10/10/2010 04:37:02 PM

## 2010-10-11 ENCOUNTER — Encounter: Payer: Self-pay | Admitting: Internal Medicine

## 2010-10-11 ENCOUNTER — Telehealth: Payer: Self-pay | Admitting: Internal Medicine

## 2010-10-16 NOTE — Letter (Signed)
Summary: Out of Work  [facility]  [address] [place]   [place], [place] [address]   Phone: [phone]  Fax: [phone]    October 11, 2010   Employee:  [employer]AMPO    To Whom It May Concern:  Mr. Esmeralda Links may return to work on 10/14/2010.  He is not able to drive at this point.     If you need additional information, please feel free to contact our office.         Sincerely,    Francetta Found, RN, BSN / Dr. Phebe Colla  Appended Document: Out of Work Faxed to patient's supervisor at [phone] per his request.

## 2010-10-16 NOTE — Progress Notes (Signed)
Summary: pt caling re rtn to work form that he brought  Phone Note Call from Patient   Caller: [phone] patient Reason for Call: Talk to Nurse Summary of Call: pt needs rtn to work form that was in his fmla paper work he brought here Initial call taken by: Lorenda Hatchet,  October 11, 2010 10:15 AM  Follow-up for Phone Call        Spoke with [employer]...they have his FMLA paper work and will send it back to Dr.Ross in the am. Patient is aware of this.  Advised him that I will fax his RTW note to his supervisor today.

## 2010-10-16 NOTE — Procedures (Signed)
Summary: wound check.st [email or web address].amber   Current Medications (verified): 1)  None  Allergies (verified): No Known Drug Allergies   ICD Specifications Following MD:  Cristopher Peru, MD     ICD Vendor:  St Jude     ICD Model Number:  [phone]     ICD Serial Number:  [id number] ICD DOI:  09/11/2010     ICD Implanting MD:  Cristopher Peru, MD  Lead 1:    Location: RV     DOI: 09/11/2010     Model #: [id number]     Serial #[id number]     Status: active  Indications::  VF ARREST  Tech Comments:  see paceart report.

## 2010-10-16 NOTE — Progress Notes (Signed)
Summary: return to work note & disability papers/waitng response from [employer]  Phone Note Call from Patient Call back at cell# [phone]   Caller: Patient Reason for Call: Talk to Nurse Summary of Call: pt needs a return to work note.  pt also will fax disability papers to be fill out. Initial call taken by: Regan Lemming,  October 08, 2010 8:14 AM  Follow-up for Phone Call        Spoke with pt he is ready to return to work.  Wants to start back on 10/14/10.  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  If we get note ready he can come pick up.  It can be written To Who it May Concern.  Call when REady Janan Halter, RN, BSN  October 08, 2010 9:40 AM  Additional Follow-up for Phone Call Additional follow up Details #1::        pt wants know if return to work note is ready Additional Follow-up by: Regan Lemming,  October 10, 2010 2:26 PM    Additional Follow-up for Phone Call Additional follow up Details #2::    Spoke with Claiborne Billings at [employer] regarding pt's FMLA papers. Pt. to call Dimas Chyle  at [phone] in the morning between 9:30 and 1:30 Pm to find out if FMLA form is ready. Pt. verbalized understanding. **Pt. also needs a return to work note from Dr. Harrington Challenger. He wants to start 10/14/10. Follow-up by: Carollee Sires, RN, BSN,  October 10, 2010 3:22 PM  Additional Follow-up for Phone Call Additional follow up Details #3:: Details for Additional Follow-up Action Taken: OK to return to work.  Cannot drive for 6 months. Additional Follow-up by: Annye Rusk, MD, [facility],  October 11, 2010 9:01 AM   Appended Document: return to work note & disability papers/waitng response from [employer] that letter is ready for pick up or fax. Call back to let me know.   Appended Document: return to work note & disability papers/waitng response from [employer] with patient and faxed RTW note to his supervisor.

## 2010-11-28 ENCOUNTER — Telehealth: Payer: Self-pay | Admitting: Internal Medicine

## 2010-12-08 ENCOUNTER — Encounter: Payer: Self-pay | Admitting: Internal Medicine

## 2010-12-11 ENCOUNTER — Encounter (INDEPENDENT_AMBULATORY_CARE_PROVIDER_SITE_OTHER): Payer: BC Managed Care – PPO

## 2010-12-11 ENCOUNTER — Encounter: Payer: Self-pay | Admitting: Internal Medicine

## 2010-12-11 ENCOUNTER — Ambulatory Visit (INDEPENDENT_AMBULATORY_CARE_PROVIDER_SITE_OTHER): Payer: BC Managed Care – PPO | Admitting: Internal Medicine

## 2010-12-11 DIAGNOSIS — Z95810 Presence of automatic (implantable) cardiac defibrillator: Secondary | ICD-10-CM | POA: Insufficient documentation

## 2010-12-11 DIAGNOSIS — R0989 Other specified symptoms and signs involving the circulatory and respiratory systems: Secondary | ICD-10-CM

## 2010-12-11 DIAGNOSIS — I10 Essential (primary) hypertension: Secondary | ICD-10-CM

## 2010-12-11 DIAGNOSIS — I2510 Atherosclerotic heart disease of native coronary artery without angina pectoris: Secondary | ICD-10-CM

## 2010-12-11 DIAGNOSIS — I4901 Ventricular fibrillation: Secondary | ICD-10-CM

## 2010-12-11 NOTE — Assessment & Plan Note (Signed)
His blood pressure is fairly well controlled. He notes that at home it tends to be lower than it was in her office today. I've asked that he maintain a low-salt diet. He does admit to sodium indiscretion. I've also last the patient to walk on a regular daily basis.

## 2010-12-11 NOTE — Progress Notes (Signed)
HPI [name] returns today for followup. He is a 55 year old male with coronary disease who sustained a ventricular fibrillation arrest. The initial thought was that the patient had an acute myocardial infarction however there is no acute infarct related artery and his troponin was only minimally elevated at the time of the event. The patient was cooled and eventually woke up. His left ventricular ejection fraction is preserved. He underwent ICD implantation for secondary prevention of recurrent ventricular arrhythmias as it was thought that there was no reversible cause of his episode. His encephalopathy has resolved completely and he is back to work without difficulty. He denies chest pain or shortness of breath or peripheral edema. No syncope. No Known Allergies   Current Outpatient Prescriptions  Medication Sig Dispense Refill  . aspirin 81 MG tablet Take 81 mg by mouth daily.        . ergocalciferol (VITAMIN D2) 50000 UNITS capsule Take 50,000 Units by mouth once a week.        . insulin glargine (LANTUS SOLOSTAR) 100 UNIT/ML injection Inject 62 Units into the skin daily.       . insulin glulisine (APIDRA) 100 UNIT/ML injection Inject into the skin. Take on a sliding scale       . metFORMIN (GLUCOPHAGE) 500 MG tablet Take 1,000 mg by mouth 2 (two) times daily with a meal.       . metoprolol (LOPRESSOR) 50 MG tablet Take 50 mg by mouth 2 (two) times daily.       . nitroGLYCERIN (NITROSTAT) 0.4 MG SL tablet Place 0.4 mg under the tongue every 5 (five) minutes as needed.        . ramipril (ALTACE) 2.5 MG capsule Take 2.5 mg by mouth daily.        . rosuvastatin (CRESTOR) 20 MG tablet Take 20 mg by mouth daily.           Past Medical History  Diagnosis Date  . Coronary artery disease   . VF (ventricular fibrillation)     arrest  . Diabetes mellitus   . Hypertension   . Dyslipidemia   . Obesity     ROS:   All systems reviewed and negative except as noted in the HPI.   Past  Surgical History  Procedure Date  . Coronary artery bypass graft   . Sternectomy      No family history on file.   History   Social History  . Marital Status: Married    Spouse Name: N/A    Number of Children: N/A  . Years of Education: N/A   Occupational History  . Not on file.   Social History Main Topics  . Smoking status: Never Smoker   . Smokeless tobacco: Not on file  . Alcohol Use: Not on file  . Drug Use: Not on file  . Sexually Active: Not on file   Other Topics Concern  . Not on file   Social History Narrative  . No narrative on file     BP 148/75  Pulse 75  Ht 5\' 7"  (1.702 m)  Wt 233 lb (105.688 kg)  BMI 36.49 kg/m2  Physical Exam:  Well appearing NAD HEENT: Unremarkable Neck:  No JVD, no thyromegally Lymphatics:  No adenopathy Back:  No CVA tenderness Lungs:  Clear. Well-healed ICD incision HEART:  Regular rate rhythm, no murmurs, no rubs, no clicks Abd:  Flat, positive bowel sounds, no organomegally, no rebound, no guarding Ext:  2 plus pulses, no edema,  no cyanosis, no clubbing Skin:  No rashes no nodules Neuro:  CN II through XII intact, motor grossly intact DEVICE  Normal device function.  See PaceArt for details.   Assess/Plan:

## 2010-12-11 NOTE — Assessment & Plan Note (Signed)
His device is working normally. There've been no recurrent arrhythmias. We'll recheck in several months.

## 2010-12-11 NOTE — Assessment & Plan Note (Signed)
He denies anginal symptoms. He will continue his current medications. 

## 2010-12-11 NOTE — Patient Instructions (Addendum)
Your physician recommends that you schedule a follow-up appointment in: device clinic in 3 months

## 2010-12-17 ENCOUNTER — Telehealth: Payer: Self-pay | Admitting: Internal Medicine

## 2010-12-17 NOTE — Telephone Encounter (Signed)
Pt has dental cleaning tomorrow needs to know if he needs abx needs answer faxed to dentist dr peele [phone]

## 2010-12-17 NOTE — Telephone Encounter (Signed)
No antibiotics needed  Implant date was in 08/2010  Patient aware  Dr Sinclair Grooms  Called office and let them know  They will let Beth know

## 2011-03-13 ENCOUNTER — Encounter: Payer: BC Managed Care – PPO | Admitting: *Deleted

## 2011-03-14 ENCOUNTER — Encounter: Payer: Self-pay | Admitting: Internal Medicine

## 2011-03-14 ENCOUNTER — Ambulatory Visit (INDEPENDENT_AMBULATORY_CARE_PROVIDER_SITE_OTHER): Payer: BC Managed Care – PPO | Admitting: *Deleted

## 2011-03-14 ENCOUNTER — Ambulatory Visit (INDEPENDENT_AMBULATORY_CARE_PROVIDER_SITE_OTHER): Payer: BC Managed Care – PPO | Admitting: Internal Medicine

## 2011-03-14 DIAGNOSIS — I10 Essential (primary) hypertension: Secondary | ICD-10-CM

## 2011-03-14 DIAGNOSIS — I2510 Atherosclerotic heart disease of native coronary artery without angina pectoris: Secondary | ICD-10-CM

## 2011-03-14 DIAGNOSIS — I4901 Ventricular fibrillation: Secondary | ICD-10-CM

## 2011-03-14 DIAGNOSIS — Z95810 Presence of automatic (implantable) cardiac defibrillator: Secondary | ICD-10-CM

## 2011-03-14 DIAGNOSIS — E785 Hyperlipidemia, unspecified: Secondary | ICD-10-CM

## 2011-03-14 LAB — ICD DEVICE OBSERVATION
DEVICE MODEL ICD: 819791
FVT: 0
PACEART VT: 0
TOT-0007: 2
TOT-0008: 0
TZON-0003SLOWVT: 350 ms
TZON-0004SLOWVT: 35
VENTRICULAR PACING ICD: 0 pct

## 2011-03-14 NOTE — Assessment & Plan Note (Signed)
Continue meds. 

## 2011-03-14 NOTE — Assessment & Plan Note (Signed)
No symptoms of angina.  Encouraged him to stay active  Lose wt.

## 2011-03-14 NOTE — Progress Notes (Signed)
ICD checked in device clinic. 

## 2011-03-14 NOTE — Progress Notes (Signed)
HPIpatient is a 55 year old who suffered a cardiac arrest in Feb.  Was resuscitated.  EKG with inferior ST elevation.  Cardiac catheterization showied a occluded saphenous vein graft to the right coronary artery and patent saphenous vein graft to the distal circumflex, patent left internal mammary artery to left anterior descending, diffuse native coronary artery disease. There was a 70% lesion distally ino the saphenous vein graft to the ramus intermedius branch which was felt initially to be possibly acute. Attempts were made to recanalize the occluded right coronary artery graft but were  unsuccessful, and this lesion was felt to be more chronic.  The patient underwent AICD placement prior to D/C.  I saw him in clinic in March Since seen he has done OK.  NO chest pain.  Breathing is OK.  No palpitations.  Activity is limited mainly to work   He walks a lot at work.   No Known Allergies  Current Outpatient Prescriptions  Medication Sig Dispense Refill  . aspirin 81 MG tablet Take 81 mg by mouth daily.        . ergocalciferol (VITAMIN D2) 50000 UNITS capsule Take 50,000 Units by mouth once a week.        . insulin glargine (LANTUS SOLOSTAR) 100 UNIT/ML injection Inject 62 Units into the skin daily.       . metFORMIN (GLUCOPHAGE) 500 MG tablet Take 1,000 mg by mouth 2 (two) times daily with a meal.       . metoprolol (LOPRESSOR) 50 MG tablet Take 50 mg by mouth 2 (two) times daily.       . nitroGLYCERIN (NITROSTAT) 0.4 MG SL tablet Place 0.4 mg under the tongue every 5 (five) minutes as needed.        . ramipril (ALTACE) 2.5 MG capsule Take 2.5 mg by mouth daily.        . rosuvastatin (CRESTOR) 20 MG tablet Take 20 mg by mouth daily.          Past Medical History  Diagnosis Date  . Coronary artery disease   . VF (ventricular fibrillation)     arrest  . Diabetes mellitus   . Hypertension   . Dyslipidemia   . Obesity     Past Surgical History  Procedure Date  . Coronary artery bypass  graft   . Sternectomy     No family history on file.  History   Social History  . Marital Status: Married    Spouse Name: N/A    Number of Children: N/A  . Years of Education: N/A   Occupational History  . Not on file.   Social History Main Topics  . Smoking status: Never Smoker   . Smokeless tobacco: Not on file  . Alcohol Use: Not on file  . Drug Use: Not on file  . Sexually Active: Not on file   Other Topics Concern  . Not on file   Social History Narrative  . No narrative on file    Review of Systems:  All systems reviewed.  They are negative to the above problem except as previously stated.  Vital Signs: BP 131/69  Pulse 54  Ht 5\' 7"  (1.702 m)  Physical Exam  Patient is in NAD  HEENT:  Normocephalic, atraumatic. EOMI, PERRLA.  Neck: JVP is normal. No thyromegaly. No bruits.  Lungs: clear to auscultation. No rales no wheezes.  Heart: Regular rate and rhythm. Normal S1, S2. No S3.   No significant murmurs. PMI not displaced.  Abdomen:  Supple, nontender. Normal bowel sounds. No masses. No hepatomegaly.  Extremities:   Good distal pulses throughout. No lower extremity edema.  Musculoskeletal :moving all extremities.  Neuro:   alert and oriented x3.  CN II-XII grossly intact.  EKG:  Sinus bradycardia.  54 bpm.  Incomp RBBB.     Assessment and Plan:

## 2011-03-14 NOTE — Assessment & Plan Note (Signed)
Adequate control. 

## 2011-03-14 NOTE — Assessment & Plan Note (Signed)
S/P AICD.  Interrogated today.

## 2011-03-19 ENCOUNTER — Encounter: Payer: Self-pay | Admitting: Internal Medicine

## 2011-03-25 ENCOUNTER — Other Ambulatory Visit: Payer: Self-pay | Admitting: Internal Medicine

## 2011-06-13 ENCOUNTER — Encounter: Payer: BC Managed Care – PPO | Admitting: *Deleted

## 2011-07-01 ENCOUNTER — Encounter: Payer: Self-pay | Admitting: Internal Medicine

## 2011-07-01 ENCOUNTER — Ambulatory Visit (INDEPENDENT_AMBULATORY_CARE_PROVIDER_SITE_OTHER): Payer: BC Managed Care – PPO | Admitting: *Deleted

## 2011-07-01 DIAGNOSIS — I2510 Atherosclerotic heart disease of native coronary artery without angina pectoris: Secondary | ICD-10-CM

## 2011-07-01 DIAGNOSIS — I4901 Ventricular fibrillation: Secondary | ICD-10-CM

## 2011-07-01 LAB — ICD DEVICE OBSERVATION
CHARGE TIME: 8.6 s
DEV-0020ICD: NEGATIVE
HV IMPEDENCE: 69 Ohm
RV LEAD IMPEDENCE ICD: 462.5 Ohm
TOT-0009: 0
TOT-0010: 4
TZON-0005SLOWVT: 6
VENTRICULAR PACING ICD: 0 pct

## 2011-07-01 NOTE — Progress Notes (Signed)
Patient here for ICD check as part of ANALYZEST study with [employer].  No problems with chest pain or shortness of breath.  Doing well.  Venora Maples Go with [facility] to check device.  No medication refills needed.  Education completed regarding shock plan.  Follow up in three months with Dr Lovena Le (already scheduled for follow-up)  Chanetta Marshall, RN, BSN 07/01/2011 11:19 AM

## 2011-08-01 ENCOUNTER — Encounter: Payer: Self-pay | Admitting: Internal Medicine

## 2011-09-17 ENCOUNTER — Other Ambulatory Visit: Payer: Self-pay | Admitting: *Deleted

## 2011-09-17 MED ORDER — NITROGLYCERIN 0.4 MG SL SUBL: 0.4000 mg | SUBLINGUAL_TABLET | SUBLINGUAL | Status: DC | PRN

## 2011-11-19 ENCOUNTER — Ambulatory Visit (INDEPENDENT_AMBULATORY_CARE_PROVIDER_SITE_OTHER): Payer: BC Managed Care – PPO | Admitting: Internal Medicine

## 2011-11-19 ENCOUNTER — Encounter: Payer: Self-pay | Admitting: Internal Medicine

## 2011-11-19 ENCOUNTER — Encounter (INDEPENDENT_AMBULATORY_CARE_PROVIDER_SITE_OTHER): Payer: BC Managed Care – PPO

## 2011-11-19 DIAGNOSIS — I4901 Ventricular fibrillation: Secondary | ICD-10-CM

## 2011-11-19 DIAGNOSIS — Z95810 Presence of automatic (implantable) cardiac defibrillator: Secondary | ICD-10-CM

## 2011-11-19 DIAGNOSIS — I10 Essential (primary) hypertension: Secondary | ICD-10-CM

## 2011-11-19 LAB — ICD DEVICE OBSERVATION
BRDY-0002RV: 40 {beats}/min
CHARGE TIME: 8.9 s
DEVICE MODEL ICD: 819791
RV LEAD AMPLITUDE: 11.7 mv
TOT-0007: 2
TOT-0008: 0
TOT-0010: 5
TZON-0003SLOWVT: 350 ms
TZON-0004SLOWVT: 35
TZON-0010SLOWVT: 40 ms
VF: 0

## 2011-11-19 NOTE — Progress Notes (Signed)
HPI Eugene [name] returns today for followup. He is a very pleasant 56 year old man who sustained a ventricular fibrillation cardiac arrest now over a year ago. He has returned to work and continues to do quite well. The etiology of his arrest is still unclear. He has coronary disease with preserved left ventricular function. He is status post ICD implantation as there was not a reversible cause for his VF arrest.. He has had no recurrent ICD shocks. 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 He works up to 80 hours a week. He has remained active despite his hypertension and diabetes. No syncope. No peripheral edema. No Known Allergies   Current Outpatient Prescriptions  Medication Sig Dispense Refill  . aspirin 81 MG tablet Take 81 mg by mouth daily.        . CRESTOR 20 MG tablet TAKE ONE TABLET BY MOUTH AT BEDTIME  30 each  6  . ergocalciferol (VITAMIN D2) 50000 UNITS capsule Take 50,000 Units by mouth every 3 (three) months.       . insulin glargine (LANTUS SOLOSTAR) 100 UNIT/ML injection Inject 62 Units into the skin daily.       . metFORMIN (GLUCOPHAGE) 500 MG tablet Take 1,000 mg by mouth 2 (two) times daily with a meal.       . metoprolol (LOPRESSOR) 50 MG tablet TAKE ONE TABLET BY MOUTH TWICE DAILY  60 tablet  6  . nitroGLYCERIN (NITROSTAT) 0.4 MG SL tablet Place 1 tablet (0.4 mg total) under the tongue every 5 (five) minutes as needed.  25 tablet  11  . ramipril (ALTACE) 2.5 MG capsule Take 2.5 mg by mouth daily.           Past Medical History  Diagnosis Date  . Coronary artery disease   . VF (ventricular fibrillation)     arrest  . Diabetes mellitus   . Hypertension   . Dyslipidemia   . Obesity     ROS:   All systems reviewed and negative except as noted in the HPI.   Past Surgical History  Procedure Date  . Coronary artery bypass graft   . Sternectomy   . Cardiac catheterization 09/16/10     Family History  Problem Relation Age of Onset  .  Coronary artery disease Father     early 87's     History   Social History  . Marital Status: Married    Spouse Name: N/A    Number of Children: N/A  . Years of Education: N/A   Occupational History  . Not on file.   Social History Main Topics  . Smoking status: Never Smoker   . Smokeless tobacco: Not on file  . Alcohol Use: Not on file  . Drug Use: Not on file  . Sexually Active: Not on file   Other Topics Concern  . Not on file   Social History Narrative  . No narrative on file     BP 106/60  Pulse 62  Ht 5' 7.5" (1.715 m)  Wt 220 lb (99.791 kg)  BMI 33.95 kg/m2  Physical Exam:  Well appearing middle-aged, obese man, NAD HEENT: Unremarkable Neck:  No JVD, no thyromegally Lungs:  Clear with no wheezes, rales, or rhonchi. Well-healed ICD incision. HEART:  Regular rate rhythm, no murmurs, no rubs, no clicks Abd:  soft, positive bowel sounds, no organomegally, no rebound, no guarding Ext:  2 plus pulses, no edema, no cyanosis, no clubbing Skin:  No rashes no nodules Neuro:  CN II through XII intact, motor grossly intact  EKG Normal sinus rhythm with incomplete right bundle branch block. DEVICE  Normal device function.  See PaceArt for details.   Assess/Plan:

## 2011-11-19 NOTE — Assessment & Plan Note (Signed)
His blood pressure is well controlled. He'll continue his current medical therapy. He admits to sodium indiscretion and I've encouraged him on the importance of low sodium diet.

## 2011-11-19 NOTE — Patient Instructions (Signed)
Your physician wants you to follow-up in: 6 months with research and 12 months with Dr Knox Saliva will receive a reminder letter in the mail two months in advance. If you don't receive a letter, please call our office to schedule the follow-up appointment.

## 2011-11-19 NOTE — Assessment & Plan Note (Signed)
He has had no recurrent ventricular arrhythmias. He will continue his current medical therapy.

## 2011-11-19 NOTE — Assessment & Plan Note (Signed)
His device is working normally. We'll plan to recheck in several months. 

## 2011-11-21 ENCOUNTER — Other Ambulatory Visit: Payer: Self-pay

## 2011-11-21 MED ORDER — METOPROLOL TARTRATE 50 MG PO TABS: 50.0000 mg | ORAL_TABLET | Freq: Two times a day (BID) | ORAL | Status: DC

## 2012-01-22 ENCOUNTER — Other Ambulatory Visit: Payer: Self-pay | Admitting: Cardiology

## 2012-01-22 MED ORDER — ROSUVASTATIN CALCIUM 20 MG PO TABS: 20.0000 mg | ORAL_TABLET | Freq: Every day | ORAL | Status: DC

## 2012-02-20 ENCOUNTER — Ambulatory Visit (INDEPENDENT_AMBULATORY_CARE_PROVIDER_SITE_OTHER): Payer: BC Managed Care – PPO | Admitting: *Deleted

## 2012-02-20 ENCOUNTER — Encounter: Payer: Self-pay | Admitting: Internal Medicine

## 2012-02-20 DIAGNOSIS — I4901 Ventricular fibrillation: Secondary | ICD-10-CM

## 2012-02-20 DIAGNOSIS — Z95810 Presence of automatic (implantable) cardiac defibrillator: Secondary | ICD-10-CM

## 2012-02-25 LAB — REMOTE ICD DEVICE
HV IMPEDENCE: 72 Ohm
RV LEAD AMPLITUDE: 11.7 mv
RV LEAD IMPEDENCE ICD: 460 Ohm
TZON-0010SLOWVT: 40 ms
VENTRICULAR PACING ICD: 1 pct

## 2012-03-10 ENCOUNTER — Encounter: Payer: Self-pay | Admitting: *Deleted

## 2012-05-05 ENCOUNTER — Encounter: Payer: Self-pay | Admitting: *Deleted

## 2012-05-14 ENCOUNTER — Ambulatory Visit (INDEPENDENT_AMBULATORY_CARE_PROVIDER_SITE_OTHER): Payer: BC Managed Care – PPO | Admitting: *Deleted

## 2012-05-14 DIAGNOSIS — I4901 Ventricular fibrillation: Secondary | ICD-10-CM

## 2012-05-14 LAB — ICD DEVICE OBSERVATION
DEVICE MODEL ICD: 819791
PACEART VT: 0
TOT-0008: 0
TOT-0009: 0
TZON-0003SLOWVT: 350 ms
VENTRICULAR PACING ICD: 0 pct

## 2012-05-14 NOTE — Progress Notes (Signed)
ICD check with industry for research

## 2012-06-01 ENCOUNTER — Encounter: Payer: Self-pay | Admitting: Internal Medicine

## 2012-08-17 ENCOUNTER — Ambulatory Visit (INDEPENDENT_AMBULATORY_CARE_PROVIDER_SITE_OTHER): Payer: BC Managed Care – PPO | Admitting: *Deleted

## 2012-08-17 DIAGNOSIS — I4901 Ventricular fibrillation: Secondary | ICD-10-CM

## 2012-08-17 DIAGNOSIS — Z95810 Presence of automatic (implantable) cardiac defibrillator: Secondary | ICD-10-CM

## 2012-08-18 LAB — REMOTE ICD DEVICE
RV LEAD AMPLITUDE: 11.7 mv
RV LEAD IMPEDENCE ICD: 480 Ohm
TZON-0003SLOWVT: 350 ms
TZON-0004SLOWVT: 35
TZON-0005SLOWVT: 6
TZON-0010SLOWVT: 40 ms
VENTRICULAR PACING ICD: 1 pct

## 2012-08-24 ENCOUNTER — Other Ambulatory Visit: Payer: Self-pay | Admitting: Internal Medicine

## 2012-08-27 ENCOUNTER — Encounter: Payer: Self-pay | Admitting: *Deleted

## 2012-09-03 ENCOUNTER — Encounter: Payer: Self-pay | Admitting: Internal Medicine

## 2012-09-15 ENCOUNTER — Ambulatory Visit (INDEPENDENT_AMBULATORY_CARE_PROVIDER_SITE_OTHER): Payer: BC Managed Care – PPO | Admitting: *Deleted

## 2012-09-15 ENCOUNTER — Encounter (INDEPENDENT_AMBULATORY_CARE_PROVIDER_SITE_OTHER): Payer: BC Managed Care – PPO

## 2012-09-15 DIAGNOSIS — Z95810 Presence of automatic (implantable) cardiac defibrillator: Secondary | ICD-10-CM

## 2012-09-15 DIAGNOSIS — R0989 Other specified symptoms and signs involving the circulatory and respiratory systems: Secondary | ICD-10-CM

## 2012-09-15 DIAGNOSIS — I4901 Ventricular fibrillation: Secondary | ICD-10-CM

## 2012-09-23 ENCOUNTER — Other Ambulatory Visit: Payer: Self-pay | Admitting: Internal Medicine

## 2012-11-06 NOTE — Progress Notes (Signed)
defib check in clinic for research.

## 2012-11-16 ENCOUNTER — Other Ambulatory Visit: Payer: Self-pay | Admitting: Internal Medicine

## 2012-12-21 LAB — ICD DEVICE OBSERVATION
DEVICE MODEL ICD: 819791
TZON-0003SLOWVT: 350 ms
TZON-0004SLOWVT: 35
TZON-0005SLOWVT: 6

## 2012-12-29 ENCOUNTER — Encounter: Payer: Self-pay | Admitting: Cardiology

## 2012-12-29 ENCOUNTER — Ambulatory Visit (INDEPENDENT_AMBULATORY_CARE_PROVIDER_SITE_OTHER): Payer: BC Managed Care – PPO | Admitting: Cardiology

## 2012-12-29 VITALS — BP 107/63 | HR 55 | Ht 67.5 in | Wt 219.0 lb

## 2012-12-29 DIAGNOSIS — I2510 Atherosclerotic heart disease of native coronary artery without angina pectoris: Secondary | ICD-10-CM

## 2012-12-29 DIAGNOSIS — I4901 Ventricular fibrillation: Secondary | ICD-10-CM

## 2012-12-29 DIAGNOSIS — I10 Essential (primary) hypertension: Secondary | ICD-10-CM

## 2012-12-29 DIAGNOSIS — Z95810 Presence of automatic (implantable) cardiac defibrillator: Secondary | ICD-10-CM

## 2012-12-29 LAB — ICD DEVICE OBSERVATION
BRDY-0002RV: 40 {beats}/min
DEV-0020ICD: NEGATIVE
FVT: 0
HV IMPEDENCE: 68 Ohm
RV LEAD AMPLITUDE: 11.7 mv
TZON-0003SLOWVT: 350 ms
TZON-0004SLOWVT: 35
TZON-0005SLOWVT: 6
VF: 0

## 2012-12-29 NOTE — Patient Instructions (Signed)
Remote monitoring is used to monitor your Pacemaker of ICD from home. This monitoring reduces the number of office visits required to check your device to one time per year. It allows [place] to keep an eye on the functioning of your device to ensure it is working properly. You are scheduled for a device check from home on 04/06/2013. You may send your transmission at any time that day. If you have a wireless device, the transmission will be sent automatically. After your physician reviews your transmission, you will receive a postcard with your next transmission date.  Your physician wants you to follow-up in: 1 year with Dr. Lovena Le. You will receive a reminder letter in the mail two months in advance. If you don't receive a letter, please call our office to schedule the follow-up appointment.

## 2012-12-29 NOTE — Progress Notes (Signed)
ELECTROPHYSIOLOGY OFFICE NOTE  Patient ID: Eugene [name] MRN: [id number], DOB/AGE: 58-Apr-2001   Date of Visit: 12/29/2012  Primary Physician: Raelene Bott, MD Primary Cardiologist / EP: Tamala Julian, MD / Lovena Le, MD Reason for Visit: EP/device follow-up  History of Present Illness  Eugene [name] is a 57 year old man with h/o VF arrest s/p single chamber ICD implant, CAD, preserved LV function and HTN who presents today for routine electrophysiology followup. Since last being seen in our clinic, he reports he is doing well. He has no complaints. Today, he denies chest pain or shortness of breath. He denies palpitations, dizziness, near syncope or syncope. He denies LE swelling, orthopnea, PND or recent weight gain. Eugene [name] reports that he is compliant and tolerating medications without difficulty.  Past Medical History Past Medical History  Diagnosis Date  . Coronary artery disease   . VF (ventricular fibrillation)     arrest  . Diabetes mellitus   . Hypertension   . Dyslipidemia   . Obesity     Past Surgical History Past Surgical History  Procedure Laterality Date  . Coronary artery bypass graft    . Sternectomy    . Cardiac catheterization  09/16/10    Allergies/Intolerances No Known Allergies  Current Home Medications Current Outpatient Prescriptions  Medication Sig Dispense Refill  . aspirin 81 MG tablet Take 81 mg by mouth daily.        . CRESTOR 20 MG tablet TAKE ONE TABLET BY MOUTH DAILY  30 tablet  5  . ergocalciferol (VITAMIN D2) 50000 UNITS capsule Take 50,000 Units by mouth every 3 (three) months.       . insulin glargine (LANTUS SOLOSTAR) 100 UNIT/ML injection Inject 62 Units into the skin daily.       . metFORMIN (GLUCOPHAGE) 500 MG tablet Take 1,000 mg by mouth 2 (two) times daily with a meal.       . metoprolol (LOPRESSOR) 50 MG tablet TAKE ONE TABLET BY MOUTH TWICE DAILY  60 tablet  2  . nitroGLYCERIN (NITROSTAT) 0.4 MG SL tablet Place 1 tablet (0.4 mg total) under  the tongue every 5 (five) minutes as needed.  25 tablet  11  . ramipril (ALTACE) 2.5 MG capsule Take 2.5 mg by mouth daily.         No current facility-administered medications for this visit.   Social History Social History  . Marital Status: Married   Social History Main Topics  . Smoking status: Never Smoker   . Smokeless tobacco: No  . Alcohol Use: No  . Drug Use: No   Review of Systems General: No chills, fever, night sweats or weight changes Cardiovascular: No chest pain, dyspnea on exertion, edema, orthopnea, palpitations, paroxysmal nocturnal dyspnea Dermatological: No rash, lesions or masses Respiratory: No cough, dyspnea Urologic: No hematuria, dysuria Abdominal: No nausea, vomiting, diarrhea, bright red blood per rectum, melena, or hematemesis Neurologic: No visual changes, weakness, changes in mental status All other systems reviewed and are otherwise negative except as noted above.  Physical Exam Blood pressure 107/63, pulse 55, height 5' 7.5" (1.715 m), weight 219 lb (99.338 kg).  General: Well developed, well appearing 57 year old male in no acute distress. HEENT: Normocephalic, atraumatic. EOMs intact. Sclera nonicteric. Oropharynx clear.  Neck: Supple without bruits. No JVD. Lungs: Respirations regular and unlabored, CTA bilaterally. No wheezes, rales or rhonchi. Heart: RRR. S1, S2 present. No murmurs, rub, S3 or S4. Abdomen: Soft, non-distended.  Extremities: No clubbing, cyanosis or edema.  DP/PT/Radials 2+ and equal bilaterally. Psych: Normal affect. Neuro: Alert and oriented X 3. Moves all extremities spontaneously.   Diagnostics Device interrogation today - Normal device function. Thresholds and sensing consistent with previous device measurements. Impedance trends stable over time. No evidence of any ventricular arrhythmias. Histogram distribution appropriate for patient and level of activity. No changes made this session. Device programmed at appropriate  safety margins. Device programmed to optimize intrinsic conduction. Estimated longevity 7.7 years.  Assessment and Plan 1. VF arrest s/p ICD implant Normal device function No episodes No programming changes made Continue routine remote device checks every 3 months Return to clinic for follow-up with me or Dr. Lovena Le in one year 2. CAD Stable without anginal symptoms Continue medical therapy and aggressive risk factor modification with tight BP and blood glucose control 3. HTN Normotensive today Continue current antihypertensive regimen  Signed, Esme Freund, PA-C 12/29/2012, 9:03 AM

## 2012-12-31 ENCOUNTER — Encounter: Payer: Self-pay | Admitting: Internal Medicine

## 2013-02-10 ENCOUNTER — Encounter: Payer: Self-pay | Admitting: Internal Medicine

## 2013-02-15 ENCOUNTER — Other Ambulatory Visit: Payer: Self-pay | Admitting: Internal Medicine

## 2013-02-15 ENCOUNTER — Encounter: Payer: Self-pay | Admitting: Internal Medicine

## 2013-02-15 ENCOUNTER — Ambulatory Visit (INDEPENDENT_AMBULATORY_CARE_PROVIDER_SITE_OTHER): Payer: BC Managed Care – PPO | Admitting: *Deleted

## 2013-02-15 DIAGNOSIS — I4901 Ventricular fibrillation: Secondary | ICD-10-CM

## 2013-02-15 DIAGNOSIS — Z95810 Presence of automatic (implantable) cardiac defibrillator: Secondary | ICD-10-CM

## 2013-02-16 NOTE — Progress Notes (Signed)
Research device check by industry. Changes made per prescribed settings for Analyze ST study. All device functions normal, no other changes made, full details in [place]

## 2013-02-17 LAB — ICD DEVICE OBSERVATION
DEV-0020ICD: NEGATIVE
HV IMPEDENCE: 72 Ohm
RV LEAD IMPEDENCE ICD: 460 Ohm
RV LEAD THRESHOLD: 0.5 V
TZON-0003SLOWVT: 350 ms
TZON-0004SLOWVT: 35
TZON-0005SLOWVT: 6

## 2013-03-01 DIAGNOSIS — M5030 Other cervical disc degeneration, unspecified cervical region: Secondary | ICD-10-CM

## 2013-03-01 DIAGNOSIS — M47812 Spondylosis without myelopathy or radiculopathy, cervical region: Secondary | ICD-10-CM | POA: Insufficient documentation

## 2013-03-01 HISTORY — DX: Other cervical disc degeneration, unspecified cervical region: M50.30

## 2013-03-10 DIAGNOSIS — I240 Acute coronary thrombosis not resulting in myocardial infarction: Secondary | ICD-10-CM | POA: Insufficient documentation

## 2013-05-24 ENCOUNTER — Ambulatory Visit (INDEPENDENT_AMBULATORY_CARE_PROVIDER_SITE_OTHER): Payer: BC Managed Care – PPO | Admitting: *Deleted

## 2013-05-24 ENCOUNTER — Encounter: Payer: Self-pay | Admitting: Internal Medicine

## 2013-05-24 DIAGNOSIS — I4901 Ventricular fibrillation: Secondary | ICD-10-CM

## 2013-05-25 LAB — REMOTE ICD DEVICE
HV IMPEDENCE: 69 Ohm
RV LEAD IMPEDENCE ICD: 440 Ohm
TZON-0005SLOWVT: 6
VENTRICULAR PACING ICD: 1 pct

## 2013-06-03 NOTE — Progress Notes (Signed)
Remote defib received

## 2013-06-04 ENCOUNTER — Encounter: Payer: Self-pay | Admitting: *Deleted

## 2013-07-20 DIAGNOSIS — E1159 Type 2 diabetes mellitus with other circulatory complications: Secondary | ICD-10-CM | POA: Insufficient documentation

## 2013-08-17 ENCOUNTER — Ambulatory Visit (INDEPENDENT_AMBULATORY_CARE_PROVIDER_SITE_OTHER): Payer: BC Managed Care – PPO | Admitting: Internal Medicine

## 2013-08-17 ENCOUNTER — Encounter: Payer: Self-pay | Admitting: Internal Medicine

## 2013-08-17 VITALS — BP 108/62 | HR 66 | Ht 67.5 in | Wt 216.0 lb

## 2013-08-17 DIAGNOSIS — I4891 Unspecified atrial fibrillation: Secondary | ICD-10-CM

## 2013-08-17 DIAGNOSIS — R0609 Other forms of dyspnea: Secondary | ICD-10-CM

## 2013-08-17 DIAGNOSIS — G4730 Sleep apnea, unspecified: Secondary | ICD-10-CM | POA: Insufficient documentation

## 2013-08-17 DIAGNOSIS — I4901 Ventricular fibrillation: Secondary | ICD-10-CM

## 2013-08-17 DIAGNOSIS — R0683 Snoring: Secondary | ICD-10-CM

## 2013-08-17 DIAGNOSIS — R0989 Other specified symptoms and signs involving the circulatory and respiratory systems: Secondary | ICD-10-CM

## 2013-08-17 DIAGNOSIS — Z95810 Presence of automatic (implantable) cardiac defibrillator: Secondary | ICD-10-CM

## 2013-08-17 LAB — MDC_IDC_ENUM_SESS_TYPE_INCLINIC
Battery Remaining Longevity: 86.4 mo
Date Time Interrogation Session: 20150120145543
HighPow Impedance: 69.75 Ohm
Implantable Pulse Generator Serial Number: 819791
Lead Channel Impedance Value: 450 Ohm
Lead Channel Pacing Threshold Amplitude: 0.5 V
MDC IDC MSMT LEADCHNL RV PACING THRESHOLD AMPLITUDE: 0.5 V
MDC IDC MSMT LEADCHNL RV PACING THRESHOLD PULSEWIDTH: 0.5 ms
MDC IDC MSMT LEADCHNL RV PACING THRESHOLD PULSEWIDTH: 0.5 ms
MDC IDC MSMT LEADCHNL RV SENSING INTR AMPL: 11.7 mV
MDC IDC SET LEADCHNL RV PACING AMPLITUDE: 2.5 V
MDC IDC SET LEADCHNL RV PACING PULSEWIDTH: 0.5 ms
MDC IDC SET LEADCHNL RV SENSING SENSITIVITY: 0.5 mV
MDC IDC SET ZONE DETECTION INTERVAL: 310 ms
MDC IDC STAT BRADY RV PERCENT PACED: 0 %
Zone Setting Detection Interval: 350 ms

## 2013-08-17 NOTE — Patient Instructions (Addendum)
Your physician wants you to follow-up in: 6 months in device clinic12 months with Dr Knox Saliva will receive a reminder letter in the mail two months in advance. If you don't receive a letter, please call our office to schedule the follow-up appointment.   Remote monitoring is used to monitor your Pacemaker or ICD from home. This monitoring reduces the number of office visits required to check your device to one time per year. It allows [place] to keep an eye on the functioning of your device to ensure it is working properly. You are scheduled for a device check from home on 11/18/13. You may send your transmission at any time that day. If you have a wireless device, the transmission will be sent automatically. After your physician reviews your transmission, you will receive a postcard with your next transmission date.     You have been referred to Dr Radford Pax for sleep evaluation

## 2013-08-17 NOTE — Assessment & Plan Note (Signed)
While he has very little in the way of daytime somnolence, he does have increased fatigue and tiredness in the evenings. In addition, he is snoring loudly, and his wife notes that he stops breathing at night while he sleeps. He will be referred for sleep evaluation.

## 2013-08-17 NOTE — Progress Notes (Signed)
HPI Eugene [name] returns today for followup. He is a very pleasant 58 year old man who sustained a ventricular fibrillation cardiac arrest now over two years ago. He has returned to work and continues to do quite well. The etiology of his arrest is still unclear. He has coronary disease with preserved left ventricular function. He is status post ICD implantation as there was not a reversible cause for his VF arrest. He has had no recurrent ICD shocks. 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 He works up to 80 hours a week. He has remained active despite his hypertension and diabetes. No syncope. No peripheral edema. His wife who is with him today notes that he has been more somnolent, and also notes that he snores very loudly night. At times, she will witness him stop breathing during one of his snoring episodes. He has very little daytime somnolence. No Known Allergies   Current Outpatient Prescriptions  Medication Sig Dispense Refill  . aspirin 81 MG tablet Take 81 mg by mouth daily.        . CRESTOR 20 MG tablet TAKE ONE TABLET BY MOUTH ONE TIME DAILY  30 tablet  10  . ergocalciferol (VITAMIN D2) 50000 UNITS capsule Take 50,000 Units by mouth every 3 (three) months.       . insulin glargine (LANTUS SOLOSTAR) 100 UNIT/ML injection Inject 62 Units into the skin daily.       . Liraglutide (VICTOZA [place]) Inject 1.2 Units into the skin daily.      . metFORMIN (GLUCOPHAGE) 500 MG tablet Take 500 mg by mouth 2 (two) times daily with a meal.       . metoprolol (LOPRESSOR) 50 MG tablet TAKE ONE TABLET BY MOUTH TWICE DAILY  60 tablet  10  . nitroGLYCERIN (NITROSTAT) 0.4 MG SL tablet Place 1 tablet (0.4 mg total) under the tongue every 5 (five) minutes as needed.  25 tablet  11  . ramipril (ALTACE) 2.5 MG capsule Take 2.5 mg by mouth daily.         No current facility-administered medications for this visit.     Past Medical History  Diagnosis Date  . Coronary artery disease   . VF  (ventricular fibrillation)     arrest  . Diabetes mellitus   . Hypertension   . Dyslipidemia   . Obesity     ROS:   All systems reviewed and negative except as noted in the HPI.   Past Surgical History  Procedure Laterality Date  . Coronary artery bypass graft    . Sternectomy    . Cardiac catheterization  09/16/10     Family History  Problem Relation Age of Onset  . Coronary artery disease Father     early 22's     History   Social History  . Marital Status: Married    Spouse Name: N/A    Number of Children: N/A  . Years of Education: N/A   Occupational History  . Not on file.   Social History Main Topics  . Smoking status: Never Smoker   . Smokeless tobacco: Not on file  . Alcohol Use: Not on file  . Drug Use: Not on file  . Sexual Activity: Not on file   Other Topics Concern  . Not on file   Social History Narrative  . No narrative on file     BP 108/62  Pulse 66  Ht 5' 7.5" (1.715 m)  Wt 216 lb (97.977 kg)  BMI  33.31 kg/m2  Physical Exam:  Well appearing middle-aged, obese man, NAD HEENT: Unremarkable Neck:  No JVD, no thyromegally Lungs:  Clear with no wheezes, rales, or rhonchi. Well-healed ICD incision. HEART:  Regular rate rhythm, no murmurs, no rubs, no clicks Abd:  soft, positive bowel sounds, no organomegally, no rebound, no guarding Ext:  2 plus pulses, no edema, no cyanosis, no clubbing Skin:  No rashes no nodules Neuro:  CN II through XII intact, motor grossly intact  EKG Normal sinus rhythm with incomplete right bundle branch block. DEVICE  Normal device function.  See PaceArt for details.   Assess/Plan:

## 2013-08-17 NOTE — Assessment & Plan Note (Signed)
He has had no recurrent ventricular arrhythmias. He will undergo watchful waiting.

## 2013-08-17 NOTE — Assessment & Plan Note (Signed)
His St. Jude ICD is working normally. We'll plan to recheck in several months. 

## 2013-10-12 ENCOUNTER — Ambulatory Visit (INDEPENDENT_AMBULATORY_CARE_PROVIDER_SITE_OTHER): Payer: BC Managed Care – PPO | Admitting: Cardiology

## 2013-10-12 ENCOUNTER — Encounter: Payer: Self-pay | Admitting: Cardiology

## 2013-10-12 VITALS — BP 149/89 | HR 72 | Ht 67.5 in | Wt 212.0 lb

## 2013-10-12 DIAGNOSIS — R0683 Snoring: Secondary | ICD-10-CM | POA: Insufficient documentation

## 2013-10-12 DIAGNOSIS — R0609 Other forms of dyspnea: Secondary | ICD-10-CM

## 2013-10-12 DIAGNOSIS — I10 Essential (primary) hypertension: Secondary | ICD-10-CM

## 2013-10-12 DIAGNOSIS — R0989 Other specified symptoms and signs involving the circulatory and respiratory systems: Secondary | ICD-10-CM

## 2013-10-12 DIAGNOSIS — G4710 Hypersomnia, unspecified: Secondary | ICD-10-CM

## 2013-10-12 DIAGNOSIS — F419 Anxiety disorder, unspecified: Secondary | ICD-10-CM | POA: Insufficient documentation

## 2013-10-12 HISTORY — DX: Snoring: R06.83

## 2013-10-12 NOTE — Progress Notes (Signed)
[facility], [place], [place]  [address] Phone: [phone] Fax:  [phone]  Date:  10/12/2013   ID:  Eugene [name], DOB 08-12-1955, MRN [id number]  PCP:  Raelene Bott, MD  Sleep Medicine:  Fransico Him, MD     History of Present Illness: Eugene [name] is a 58 y.o. male with a history of ASCAD, DM, Vfib and HTN who presents today for evaluation of possible sleep apnea.  He sees Dr. Lovena Le for his cardiac issues and recently complained of loud snoring at night with increased daytime sleepiness.  His wife has noticed him stop breathing during snoring sometimes.  He is now referred for evaluation for snoring.  He has nonrestorative sleep but he attributes it to getting up several times nightly to let the dog out. He occasionally has some sleepiness during the day but has not had any problems falling asleep driving.     Wt Readings from Last 3 Encounters:  08/17/13 216 lb (97.977 kg)  12/29/12 219 lb (99.338 kg)  11/19/11 220 lb (99.791 kg)     Past Medical History  Diagnosis Date  . Coronary artery disease s/p CABG   . VF (ventricular fibrillation)     arrest  . Diabetes mellitus   . Hypertension   . Dyslipidemia   . Obesity   . Asthma     Current Outpatient Prescriptions  Medication Sig Dispense Refill  . aspirin 81 MG tablet Take 81 mg by mouth daily.        . CRESTOR 20 MG tablet TAKE ONE TABLET BY MOUTH ONE TIME DAILY  30 tablet  10  . ergocalciferol (VITAMIN D2) 50000 UNITS capsule Take 50,000 Units by mouth every 3 (three) months.       . insulin glargine (LANTUS SOLOSTAR) 100 UNIT/ML injection Inject 62 Units into the skin daily.       . Liraglutide (VICTOZA [place]) Inject 1.2 Units into the skin daily.      . metFORMIN (GLUCOPHAGE) 500 MG tablet Take 500 mg by mouth 2 (two) times daily with a meal.       . metoprolol (LOPRESSOR) 50 MG tablet TAKE ONE TABLET BY MOUTH TWICE DAILY  60 tablet  10  . nitroGLYCERIN (NITROSTAT) 0.4 MG SL tablet Place 1 tablet (0.4  mg total) under the tongue every 5 (five) minutes as needed.  25 tablet  11  . ramipril (ALTACE) 2.5 MG capsule Take 2.5 mg by mouth daily.         No current facility-administered medications for this visit.    Allergies:   No Known Allergies  Social History:  The patient  reports that he has never smoked. He does not have any smokeless tobacco history on file. He reports that he does not drink alcohol or use illicit drugs.   Family History:  The patient's family history includes Coronary artery disease in his father.   ROS:  Please see the history of present illness.      All other systems reviewed and negative.   PHYSICAL EXAM: VS:  There were no vitals taken for this visit. Well nourished, well developed, in no acute distress HEENT: normal Neck: no JVD Cardiac:  normal S1, S2; RRR; no murmur Lungs:  clear to auscultation bilaterally, no wheezing, rhonchi or rales Abd: soft, nontender, no hepatomegaly Ext: no edema Skin: warm and dry Neuro:  CNs 2-12 intact, no focal abnormalities noted       ASSESSMENT AND PLAN:  1.  Severe snoring 2. Excessive daytime sleepiness - which may be related to disrupted sleep at night from his dog  - will set up for split night PSG 3. Obesity - he does not get any aerobic exercise. 4. HTN - mildly elevated but he says it is normal at home.  Followup PRN pending results of study  Signed, Fransico Him, MD 10/12/2013 11:21 AM

## 2013-10-12 NOTE — Patient Instructions (Signed)
Your physician recommends that you continue on your current medications as directed. Please refer to the Current Medication list given to you today.  Your physician has recommended that you have a sleep study. This test records several body functions during sleep, including: brain activity, eye movement, oxygen and carbon dioxide blood levels, heart rate and rhythm, breathing rate and rhythm, the flow of air through your mouth and nose, snoring, body muscle movements, and chest and belly movement. (schedule at [facility] sleep [facility])  Your physician recommends that you schedule a follow-up appointment PRN

## 2013-11-18 ENCOUNTER — Ambulatory Visit (INDEPENDENT_AMBULATORY_CARE_PROVIDER_SITE_OTHER): Payer: BC Managed Care – PPO | Admitting: *Deleted

## 2013-11-18 DIAGNOSIS — I4901 Ventricular fibrillation: Secondary | ICD-10-CM

## 2013-11-21 LAB — MDC_IDC_ENUM_SESS_TYPE_REMOTE
Brady Statistic RV Percent Paced: 1 %
HIGH POWER IMPEDANCE MEASURED VALUE: 73 Ohm
Implantable Pulse Generator Serial Number: 819791
Lead Channel Impedance Value: 460 Ohm
Lead Channel Pacing Threshold Amplitude: 0.5 V
Lead Channel Pacing Threshold Pulse Width: 0.5 ms
Lead Channel Sensing Intrinsic Amplitude: 11.7 mV
Lead Channel Setting Sensing Sensitivity: 0.5 mV
MDC IDC MSMT BATTERY REMAINING LONGEVITY: 81 mo
MDC IDC SET LEADCHNL RV PACING AMPLITUDE: 2.5 V
MDC IDC SET LEADCHNL RV PACING PULSEWIDTH: 0.5 ms
MDC IDC SET ZONE DETECTION INTERVAL: 310 ms
Zone Setting Detection Interval: 350 ms

## 2013-11-29 ENCOUNTER — Encounter: Payer: Self-pay | Admitting: Vascular Surgery

## 2013-11-29 NOTE — Progress Notes (Signed)
ICD remote 

## 2013-11-30 ENCOUNTER — Encounter: Payer: Self-pay | Admitting: Cardiology

## 2013-11-30 ENCOUNTER — Encounter: Payer: Self-pay | Admitting: Vascular Surgery

## 2013-11-30 ENCOUNTER — Ambulatory Visit (INDEPENDENT_AMBULATORY_CARE_PROVIDER_SITE_OTHER): Payer: BC Managed Care – PPO | Admitting: Vascular Surgery

## 2013-11-30 ENCOUNTER — Other Ambulatory Visit: Payer: Self-pay | Admitting: *Deleted

## 2013-11-30 VITALS — BP 150/93 | HR 74 | Resp 16 | Ht 67.5 in | Wt 210.6 lb

## 2013-11-30 DIAGNOSIS — I739 Peripheral vascular disease, unspecified: Secondary | ICD-10-CM | POA: Insufficient documentation

## 2013-11-30 DIAGNOSIS — I70209 Unspecified atherosclerosis of native arteries of extremities, unspecified extremity: Secondary | ICD-10-CM | POA: Insufficient documentation

## 2013-11-30 DIAGNOSIS — M25579 Pain in unspecified ankle and joints of unspecified foot: Secondary | ICD-10-CM | POA: Insufficient documentation

## 2013-11-30 DIAGNOSIS — L98499 Non-pressure chronic ulcer of skin of other sites with unspecified severity: Secondary | ICD-10-CM

## 2013-11-30 NOTE — Progress Notes (Signed)
Subjective:     Patient ID: Eugene [name], male   DOB: 19-Feb-1956, 58 y.o.   MRN: [id number]  HPI this 58 year old male was referred for evaluation of nonhealing ulcer right ankle area. Patient is diabetic and noticed to ulcers in the medial aspect right lower leg in February of this year. They have improved slightly but not resolved. He has bilateral calf claudication symptoms at about one block. He has no history of rest pain or nonhealing ulceration or gangrene. He does have a cardiac history having undergone coronary artery bypass grafting in 2006 by Dr Ceasar Mons and did very well. He did have a episode of asystole and cardiac arrest and 2012 and currently has a pacemaker and implantable defibrillator managed by Dr. Crissie Sickles. He is not on anticoagulants.  Past Medical History  Diagnosis Date  . Coronary artery disease   . VF (ventricular fibrillation)     arrest  . Diabetes mellitus   . Hypertension   . Dyslipidemia   . Obesity   . Asthma     History  Substance Use Topics  . Smoking status: Never Smoker   . Smokeless tobacco: Never Used  . Alcohol Use: No    Family History  Problem Relation Age of Onset  . Coronary artery disease Father     early 63's  . Diabetes Father   . Hypertension Father   . Cancer Mother   . Diabetes Sister   . Cancer Brother   . Heart disease Brother     No Known Allergies  Current outpatient prescriptions:aspirin 81 MG tablet, Take 81 mg by mouth daily.  , Disp: , Rfl: ;  CRESTOR 20 MG tablet, TAKE ONE TABLET BY MOUTH ONE TIME DAILY, Disp: 30 tablet, Rfl: 10;  ergocalciferol (VITAMIN D2) 50000 UNITS capsule, Take 50,000 Units by mouth every 3 (three) months. , Disp: , Rfl: ;  insulin glargine (LANTUS SOLOSTAR) 100 UNIT/ML injection, Inject 62 Units into the skin daily. , Disp: , Rfl:  Liraglutide (VICTOZA [place]), Inject 1.8 Units into the skin daily. , Disp: , Rfl: ;  metFORMIN (GLUCOPHAGE) 500 MG tablet, Take 500 mg by mouth 2 (two) times daily  with a meal. , Disp: , Rfl: ;  metoprolol (LOPRESSOR) 50 MG tablet, TAKE ONE TABLET BY MOUTH TWICE DAILY, Disp: 60 tablet, Rfl: 10;  nitroGLYCERIN (NITROSTAT) 0.4 MG SL tablet, Place 1 tablet (0.4 mg total) under the tongue every 5 (five) minutes as needed., Disp: 25 tablet, Rfl: 11 ramipril (ALTACE) 2.5 MG capsule, Take 2.5 mg by mouth daily.  , Disp: , Rfl:   BP 150/93  Pulse 74  Resp 16  Ht 5' 7.5" (1.715 m)  Wt 210 lb 9.6 oz (95.528 kg)  BMI 32.48 kg/m2  Body mass index is 32.48 kg/(m^2).           Review of Systems denies chest pain, dyspnea on exertion, PND, orthopnea, hemoptysis. He has weakness in arms and legs. Denies lateralizing weakness, aphasia, and proceed as complicated, blurred vision, syncope. All other systems negative. Review of systems     Objective:   Physical Exam BP 150/93  Pulse 74  Resp 16  Ht 5' 7.5" (1.715 m)  Wt 210 lb 9.6 oz (95.528 kg)  BMI 32.48 kg/m2  Gen.-alert and oriented x3 in no apparent distress HEENT normal for age Lungs no rhonchi or wheezing Cardiovascular regular rhythm no murmurs carotid pulses 3+ palpable no bruits audible Abdomen soft nontender no palpable masses Musculoskeletal free of  major deformities Skin clear -no rashes Neurologic normal Lower extremities 3+ femoral and popliteal pulses palpable bilaterally. Left foot with 2+ dorsalis pedis pulse palpable. Right leg with no palpable pulses in the foot. Does have to circular ulcerations adjacent to right medial malleolus measuring about 0.6 cm in diameter with no cellulitis or fluctuance or drainage.  Previous duplex scan of right lower extremity performed 10/07/2013 revealed diffuse atherosclerotic plaque with severe stenosis or occlusion of right popliteal artery. 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 Trula Slade on Tuesday, May 19. If unable to be treated by endovascular techniques patient may need lower extremity bypass

## 2013-12-09 ENCOUNTER — Encounter: Payer: Self-pay | Admitting: Internal Medicine

## 2013-12-13 MED ORDER — SODIUM CHLORIDE 0.9 % IV SOLN
INTRAVENOUS | Status: DC
  Administered 2013-12-14: 1000 mL via INTRAVENOUS

## 2013-12-14 ENCOUNTER — Ambulatory Visit (HOSPITAL_COMMUNITY)
Admission: RE | Admit: 2013-12-14 | Discharge: 2013-12-14 | Disposition: A | Discharge status: Home / Self Care | Payer: BC Managed Care – PPO | Source: Ambulatory Visit | Attending: Surgery | Admitting: Surgery

## 2013-12-14 ENCOUNTER — Encounter (HOSPITAL_COMMUNITY)
Admission: RE | Disposition: A | Discharge status: Home / Self Care | Payer: Self-pay | Source: Ambulatory Visit | Attending: Surgery

## 2013-12-14 ENCOUNTER — Telehealth: Payer: Self-pay | Admitting: Vascular Surgery

## 2013-12-14 DIAGNOSIS — L97309 Non-pressure chronic ulcer of unspecified ankle with unspecified severity: Secondary | ICD-10-CM | POA: Insufficient documentation

## 2013-12-14 DIAGNOSIS — Z95810 Presence of automatic (implantable) cardiac defibrillator: Secondary | ICD-10-CM | POA: Insufficient documentation

## 2013-12-14 DIAGNOSIS — I2510 Atherosclerotic heart disease of native coronary artery without angina pectoris: Secondary | ICD-10-CM | POA: Insufficient documentation

## 2013-12-14 DIAGNOSIS — Z951 Presence of aortocoronary bypass graft: Secondary | ICD-10-CM | POA: Insufficient documentation

## 2013-12-14 DIAGNOSIS — Z6832 Body mass index (BMI) 32.0-32.9, adult: Secondary | ICD-10-CM | POA: Insufficient documentation

## 2013-12-14 DIAGNOSIS — J45909 Unspecified asthma, uncomplicated: Secondary | ICD-10-CM | POA: Insufficient documentation

## 2013-12-14 DIAGNOSIS — I739 Peripheral vascular disease, unspecified: Secondary | ICD-10-CM

## 2013-12-14 DIAGNOSIS — L98499 Non-pressure chronic ulcer of skin of other sites with unspecified severity: Principal | ICD-10-CM | POA: Insufficient documentation

## 2013-12-14 DIAGNOSIS — I10 Essential (primary) hypertension: Secondary | ICD-10-CM | POA: Insufficient documentation

## 2013-12-14 DIAGNOSIS — E669 Obesity, unspecified: Secondary | ICD-10-CM | POA: Insufficient documentation

## 2013-12-14 DIAGNOSIS — Z8674 Personal history of sudden cardiac arrest: Secondary | ICD-10-CM | POA: Insufficient documentation

## 2013-12-14 DIAGNOSIS — E785 Hyperlipidemia, unspecified: Secondary | ICD-10-CM | POA: Insufficient documentation

## 2013-12-14 HISTORY — PX: ABDOMINAL AORTAGRAM: SHX5454

## 2013-12-14 LAB — POCT I-STAT, CHEM 8
BUN: 11 mg/dL (ref 6–23)
CREATININE: 0.9 mg/dL (ref 0.50–1.35)
Calcium, Ion: 1.24 mmol/L — ABNORMAL HIGH (ref 1.12–1.23)
Chloride: 103 mEq/L (ref 96–112)
Glucose, Bld: 256 mg/dL — ABNORMAL HIGH (ref 70–99)
HEMATOCRIT: 41 % (ref 39.0–52.0)
Hemoglobin: 13.9 g/dL (ref 13.0–17.0)
POTASSIUM: 4.1 meq/L (ref 3.7–5.3)
Sodium: 141 mEq/L (ref 137–147)
TCO2: 22 mmol/L (ref 0–100)

## 2013-12-14 LAB — GLUCOSE, CAPILLARY
Glucose-Capillary: 240 mg/dL — ABNORMAL HIGH (ref 70–99)
Glucose-Capillary: 217 mg/dL — ABNORMAL HIGH (ref 70–99)

## 2013-12-14 SURGERY — ABDOMINAL AORTAGRAM
Anesthesia: LOCAL

## 2013-12-14 MED ORDER — ONDANSETRON HCL 4 MG/2ML IJ SOLN: 4.0000 mg | Freq: Four times a day (QID) | INTRAMUSCULAR | Status: DC | PRN

## 2013-12-14 MED ORDER — HEPARIN (PORCINE) IN NACL 2-0.9 UNIT/ML-% IJ SOLN
INTRAMUSCULAR | Status: AC
  Filled 2013-12-14: qty 1000

## 2013-12-14 MED ORDER — GUAIFENESIN-DM 100-10 MG/5ML PO SYRP: 15.0000 mL | ORAL_SOLUTION | ORAL | Status: DC | PRN

## 2013-12-14 MED ORDER — LABETALOL HCL 5 MG/ML IV SOLN: 10.0000 mg | INTRAVENOUS | Status: DC | PRN

## 2013-12-14 MED ORDER — ACETAMINOPHEN 325 MG PO TABS: 325.0000 mg | ORAL_TABLET | ORAL | Status: DC | PRN

## 2013-12-14 MED ORDER — PHENOL 1.4 % MT LIQD: 1.0000 | OROMUCOSAL | Status: DC | PRN

## 2013-12-14 MED ORDER — FENTANYL CITRATE 0.05 MG/ML IJ SOLN
INTRAMUSCULAR | Status: AC
  Filled 2013-12-14: qty 2

## 2013-12-14 MED ORDER — OXYCODONE HCL 5 MG PO TABS: 5.0000 mg | ORAL_TABLET | ORAL | Status: DC | PRN

## 2013-12-14 MED ORDER — ALUM & MAG HYDROXIDE-SIMETH 200-200-20 MG/5ML PO SUSP: 15.0000 mL | ORAL | Status: DC | PRN

## 2013-12-14 MED ORDER — ACETAMINOPHEN 325 MG RE SUPP: 325.0000 mg | RECTAL | Status: DC | PRN

## 2013-12-14 MED ORDER — SODIUM CHLORIDE 0.9 % IV SOLN: 1.0000 mL/kg/h | INTRAVENOUS | Status: DC

## 2013-12-14 MED ORDER — LIDOCAINE HCL (PF) 1 % IJ SOLN
INTRAMUSCULAR | Status: AC
  Filled 2013-12-14: qty 30

## 2013-12-14 MED ORDER — METOPROLOL TARTRATE 1 MG/ML IV SOLN: 2.0000 mg | INTRAVENOUS | Status: DC | PRN

## 2013-12-14 MED ORDER — HYDRALAZINE HCL 20 MG/ML IJ SOLN: 10.0000 mg | INTRAMUSCULAR | Status: DC | PRN

## 2013-12-14 MED ORDER — MORPHINE SULFATE 10 MG/ML IJ SOLN: 2.0000 mg | INTRAMUSCULAR | Status: DC | PRN

## 2013-12-14 MED ORDER — MIDAZOLAM HCL 2 MG/2ML IJ SOLN
INTRAMUSCULAR | Status: AC
  Filled 2013-12-14: qty 2

## 2013-12-14 SURGICAL SUPPLY — 53 items
BANDAGE ELASTIC 4 VELCRO ST LF (GAUZE/BANDAGES/DRESSINGS) IMPLANT
BANDAGE ESMARK 6X9 LF (GAUZE/BANDAGES/DRESSINGS) IMPLANT
BNDG ESMARK 6X9 LF (GAUZE/BANDAGES/DRESSINGS)
CANISTER SUCTION 2500CC (MISCELLANEOUS) ×3 IMPLANT
CLIP TI MEDIUM 24 (CLIP) ×3 IMPLANT
CLIP TI WIDE RED SMALL 24 (CLIP) ×3 IMPLANT
COVER SURGICAL LIGHT HANDLE (MISCELLANEOUS) ×3 IMPLANT
CUFF TOURNIQUET SINGLE 24IN (TOURNIQUET CUFF) IMPLANT
CUFF TOURNIQUET SINGLE 34IN LL (TOURNIQUET CUFF) IMPLANT
CUFF TOURNIQUET SINGLE 44IN (TOURNIQUET CUFF) IMPLANT
DERMABOND ADVANCED (GAUZE/BANDAGES/DRESSINGS) ×1
DERMABOND ADVANCED .7 DNX12 (GAUZE/BANDAGES/DRESSINGS) ×2 IMPLANT
DRAIN CHANNEL 15F RND FF W/TCR (WOUND CARE) IMPLANT
DRAPE WARM FLUID 44X44 (DRAPE) ×3 IMPLANT
DRAPE X-RAY CASS 24X20 (DRAPES) IMPLANT
DRSG COVADERM 4X10 (GAUZE/BANDAGES/DRESSINGS) IMPLANT
DRSG COVADERM 4X8 (GAUZE/BANDAGES/DRESSINGS) IMPLANT
ELECT REM PT RETURN 9FT ADLT (ELECTROSURGICAL) ×3
ELECTRODE REM PT RTRN 9FT ADLT (ELECTROSURGICAL) ×2 IMPLANT
EVACUATOR SILICONE 100CC (DRAIN) IMPLANT
GLOVE BIOGEL PI IND STRL 7.5 (GLOVE) ×2 IMPLANT
GLOVE BIOGEL PI INDICATOR 7.5 (GLOVE) ×1
GLOVE SURG SS PI 7.5 STRL IVOR (GLOVE) ×3 IMPLANT
GOWN PREVENTION PLUS XXLARGE (GOWN DISPOSABLE) ×3 IMPLANT
GOWN STRL NON-REIN LRG LVL3 (GOWN DISPOSABLE) ×9 IMPLANT
HEMOSTAT SNOW SURGICEL 2X4 (HEMOSTASIS) IMPLANT
KIT BASIN OR (CUSTOM PROCEDURE TRAY) ×3 IMPLANT
KIT ROOM TURNOVER OR (KITS) ×3 IMPLANT
MARKER GRAFT CORONARY BYPASS (MISCELLANEOUS) IMPLANT
NS IRRIG 1000ML POUR BTL (IV SOLUTION) ×6 IMPLANT
PACK PERIPHERAL VASCULAR (CUSTOM PROCEDURE TRAY) ×3 IMPLANT
PAD ARMBOARD 7.5X6 YLW CONV (MISCELLANEOUS) ×6 IMPLANT
PADDING CAST COTTON 6X4 STRL (CAST SUPPLIES) IMPLANT
SET COLLECT BLD 21X3/4 12 (NEEDLE) IMPLANT
STOPCOCK 4 WAY LG BORE MALE ST (IV SETS) IMPLANT
SUT ETHILON 3 0 PS 1 (SUTURE) IMPLANT
SUT PROLENE 5 0 C 1 24 (SUTURE) ×3 IMPLANT
SUT PROLENE 6 0 BV (SUTURE) ×3 IMPLANT
SUT PROLENE 7 0 BV 1 (SUTURE) IMPLANT
SUT SILK 2 0 SH (SUTURE) ×3 IMPLANT
SUT SILK 3 0 (SUTURE)
SUT SILK 3-0 18XBRD TIE 12 (SUTURE) IMPLANT
SUT VIC AB 2-0 CT1 27 (SUTURE) ×2
SUT VIC AB 2-0 CT1 TAPERPNT 27 (SUTURE) ×4 IMPLANT
SUT VIC AB 3-0 SH 27 (SUTURE) ×2
SUT VIC AB 3-0 SH 27X BRD (SUTURE) ×4 IMPLANT
SUT VICRYL 4-0 PS2 18IN ABS (SUTURE) ×6 IMPLANT
TOWEL OR 17X24 6PK STRL BLUE (TOWEL DISPOSABLE) ×6 IMPLANT
TOWEL OR 17X26 10 PK STRL BLUE (TOWEL DISPOSABLE) ×6 IMPLANT
TRAY FOLEY CATH 16FRSI W/METER (SET/KITS/TRAYS/PACK) ×3 IMPLANT
TUBING EXTENTION W/L.L. (IV SETS) IMPLANT
UNDERPAD 30X30 INCONTINENT (UNDERPADS AND DIAPERS) ×3 IMPLANT
WATER STERILE IRR 1000ML POUR (IV SOLUTION) ×3 IMPLANT

## 2013-12-14 NOTE — Discharge Instructions (Signed)
Arteriogram °Care After °These instructions give you information on caring for yourself after your procedure. Your doctor may also give you more specific instructions. Call your doctor if you have any problems or questions after your procedure. °HOME CARE °· Stay in bed the rest of the day. °· Keep your leg straight for at least 6 hours. °· Do not lift anything heavier than 10 pounds (about a gallon of milk) for 2 days. °· Do not walk a lot, run, or drive for 2 days. °· Return to normal activities in 2 days or as told by your doctor. °Finding out the results of your test °Ask when your test results will be ready. Make sure you get your test results. °GET HELP RIGHT AWAY IF:  °· You have fever of 102° F (38.9° C) or higher. °· You have more pain in your leg. °· The leg that was cut is: °· Bleeding. °· Puffy (swollen) or red. °· Cold. °· Pale or changes color. °· Weak. °· Tingly or numb. °If you go to the Emergency Room, tell your nurse that you have had an arteriogram. Take this paper with you to show the nurse. °MAKE SURE YOU: °· Understand these instructions. °· Will watch your condition. °· Will get help right away if you are not doing well or get worse. °Document Released: 10/11/2008 Document Revised: 10/07/2011 Document Reviewed: 10/11/2008 °ExitCare® Patient Information ©2014 [employer]. ° °

## 2013-12-14 NOTE — H&P (View-Only) (Signed)
Subjective:     Patient ID: Eugene [name], male   DOB: 1956/05/23, 58 y.o.   MRN: [id number]  HPI this 58 year old male was referred for evaluation of nonhealing ulcer right ankle area. Patient is diabetic and noticed to ulcers in the medial aspect right lower leg in February of this year. They have improved slightly but not resolved. He has bilateral calf claudication symptoms at about one block. He has no history of rest pain or nonhealing ulceration or gangrene. He does have a cardiac history having undergone coronary artery bypass grafting in 2006 by Dr Ceasar Mons and did very well. He did have a episode of asystole and cardiac arrest and 2012 and currently has a pacemaker and implantable defibrillator managed by Dr. Crissie Sickles. He is not on anticoagulants.  Past Medical History  Diagnosis Date  . Coronary artery disease   . VF (ventricular fibrillation)     arrest  . Diabetes mellitus   . Hypertension   . Dyslipidemia   . Obesity   . Asthma     History  Substance Use Topics  . Smoking status: Never Smoker   . Smokeless tobacco: Never Used  . Alcohol Use: No    Family History  Problem Relation Age of Onset  . Coronary artery disease Father     early 53's  . Diabetes Father   . Hypertension Father   . Cancer Mother   . Diabetes Sister   . Cancer Brother   . Heart disease Brother     No Known Allergies  Current outpatient prescriptions:aspirin 81 MG tablet, Take 81 mg by mouth daily.  , Disp: , Rfl: ;  CRESTOR 20 MG tablet, TAKE ONE TABLET BY MOUTH ONE TIME DAILY, Disp: 30 tablet, Rfl: 10;  ergocalciferol (VITAMIN D2) 50000 UNITS capsule, Take 50,000 Units by mouth every 3 (three) months. , Disp: , Rfl: ;  insulin glargine (LANTUS SOLOSTAR) 100 UNIT/ML injection, Inject 62 Units into the skin daily. , Disp: , Rfl:  Liraglutide (VICTOZA [place]), Inject 1.8 Units into the skin daily. , Disp: , Rfl: ;  metFORMIN (GLUCOPHAGE) 500 MG tablet, Take 500 mg by mouth 2 (two) times daily  with a meal. , Disp: , Rfl: ;  metoprolol (LOPRESSOR) 50 MG tablet, TAKE ONE TABLET BY MOUTH TWICE DAILY, Disp: 60 tablet, Rfl: 10;  nitroGLYCERIN (NITROSTAT) 0.4 MG SL tablet, Place 1 tablet (0.4 mg total) under the tongue every 5 (five) minutes as needed., Disp: 25 tablet, Rfl: 11 ramipril (ALTACE) 2.5 MG capsule, Take 2.5 mg by mouth daily.  , Disp: , Rfl:   BP 150/93  Pulse 74  Resp 16  Ht 5' 7.5" (1.715 m)  Wt 210 lb 9.6 oz (95.528 kg)  BMI 32.48 kg/m2  Body mass index is 32.48 kg/(m^2).           Review of Systems denies chest pain, dyspnea on exertion, PND, orthopnea, hemoptysis. He has weakness in arms and legs. Denies lateralizing weakness, aphasia, and proceed as complicated, blurred vision, syncope. All other systems negative. Review of systems     Objective:   Physical Exam BP 150/93  Pulse 74  Resp 16  Ht 5' 7.5" (1.715 m)  Wt 210 lb 9.6 oz (95.528 kg)  BMI 32.48 kg/m2  Gen.-alert and oriented x3 in no apparent distress HEENT normal for age Lungs no rhonchi or wheezing Cardiovascular regular rhythm no murmurs carotid pulses 3+ palpable no bruits audible Abdomen soft nontender no palpable masses Musculoskeletal free of  major deformities Skin clear -no rashes Neurologic normal Lower extremities 3+ femoral and popliteal pulses palpable bilaterally. Left foot with 2+ dorsalis pedis pulse palpable. Right leg with no palpable pulses in the foot. Does have to circular ulcerations adjacent to right medial malleolus measuring about 0.6 cm in diameter with no cellulitis or fluctuance or drainage.  Previous duplex scan of right lower extremity performed 10/07/2013 revealed diffuse atherosclerotic plaque with severe stenosis or occlusion of right popliteal artery. 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 Trula Slade on Tuesday, May 19. If unable to be treated by endovascular techniques patient may need lower extremity bypass

## 2013-12-14 NOTE — Telephone Encounter (Addendum)
Message copied by Gena Fray on Tue Dec 14, 2013  1:49 PM ------      Message from: Mena Goes      Created: Tue Dec 14, 2013 10:31 AM      Regarding: Schedule       He says on other message that [facility] appt in 3 months.             ----- Message -----         From: [name], MD         Sent: 12/14/2013   8:28 AM           To: [facility] Charge Pool            12/14/2013:                  Surgeon:  [name]      Procedure Performed:       1.  ultrasound-guided access, left femoral artery       2.  abdominal aortogram       3.  bilateral lower extremity runoff       4.  second order catheterization       ------  12/14/13 left message for pt regarding follow up info, dpm

## 2013-12-14 NOTE — Interval H&P Note (Signed)
History and Physical Interval Note:  12/14/2013 7:37 AM  Eugene [name]  has presented today for surgery, with the diagnosis of pvd/ulcer right foot  The various methods of treatment have been discussed with the patient and family. After consideration of risks, benefits and other options for treatment, the patient has consented to  Procedure(s): ABDOMINAL AORTAGRAM (N/A) as a surgical intervention .  The patient's history has been reviewed, patient examined, no change in status, stable for surgery.  I have reviewed the patient's chart and labs.  Questions were answered to the patient's satisfaction.     [name]

## 2013-12-14 NOTE — Op Note (Signed)
    Patient name: Eugene [name] MRN: [id number] DOB: [birth year] Sex: male  12/14/2013 Pre-operative Diagnosis: Right foot ulcer Post-operative diagnosis:  Same Surgeon:  [name] Procedure Performed:  1.  ultrasound-guided access, left femoral artery  2.  abdominal aortogram  3.  bilateral lower extremity runoff  4.  second order catheterization    Indications:  This is a 58 year old gentleman with diabetes who has a nonhealing ulcer on the right foot.  He is here for further evaluation and possible intervention  Procedure:  The patient was identified in the holding area and taken to room 8.  The patient was then placed supine on the table and prepped and draped in the usual sterile fashion.  A time out was called.  Ultrasound was used to evaluate the left common femoral artery.  It was patent .  A digital ultrasound image was acquired.  A micropuncture needle was used to access the left common femoral artery under ultrasound guidance.  An 018 wire was advanced without resistance and a micropuncture sheath was placed.  The 018 wire was removed and a benson wire was placed.  The micropuncture sheath was exchanged for a 5 french sheath.  An omniflush catheter was advanced over the wire to the level of L-1.  An abdominal angiogram was obtained.  Next, using the omniflush catheter and a benson wire, the aortic bifurcation was crossed and the catheter was placed into theright external iliac artery and right runoff was obtained.  left runoff was performed via retrograde sheath injections.  Findings:   Aortogram:  No significant suprarenal aortic disease is identified.  There is no evidence of renal artery stenosis.  The infrarenal abdominal aorta is widely patent.  Bilateral common and external iliac arteries are widely patent.  Right Lower Extremity:  The right common femoral, profundofemoral, and superficial femoral artery are widely patent.  There is occlusion of the popliteal artery with  reconstitution of a diseased below knee popliteal artery and proximal peroneal artery.  The dominant runoff to the peroneal artery which becomes adequate in the mid calf.  Does collateralize to the posterior tibial artery at the ankle.  Left Lower Extremity:  The left common femoral profunda femoral and superficial femoral arteries are widely patent.  The below knee popliteal artery becomes diseased, extending into the proximal peroneal artery which is the single vessel runoff.  There is reconstitution of the anterior tibial posterior tibial artery and the lower part of the leg.  Intervention:  None  Impression:  #1  no significant aortoiliac occlusive disease  #2  occluded right popliteal artery with reconstitution of the peroneal artery, which is the single vessel runoff  #3  diseased left below knee popliteal artery and proximal peroneal artery, which is the single vessel runoff    V. Annamarie Major, M.D. [facility] Office: [phone] Pager:  [phone]

## 2013-12-27 ENCOUNTER — Other Ambulatory Visit: Payer: Self-pay | Admitting: Internal Medicine

## 2014-01-12 DIAGNOSIS — D126 Benign neoplasm of colon, unspecified: Secondary | ICD-10-CM

## 2014-01-12 DIAGNOSIS — K5790 Diverticulosis of intestine, part unspecified, without perforation or abscess without bleeding: Secondary | ICD-10-CM

## 2014-01-12 HISTORY — DX: Diverticulosis of intestine, part unspecified, without perforation or abscess without bleeding: K57.90

## 2014-01-12 HISTORY — PX: COLONOSCOPY: SHX174

## 2014-01-12 HISTORY — DX: Benign neoplasm of colon, unspecified: D12.6

## 2014-02-01 ENCOUNTER — Telehealth: Payer: Self-pay | Admitting: Internal Medicine

## 2014-02-03 ENCOUNTER — Encounter: Payer: Self-pay | Admitting: Internal Medicine

## 2014-02-03 DIAGNOSIS — Z800 Family history of malignant neoplasm of digestive organs: Secondary | ICD-10-CM | POA: Insufficient documentation

## 2014-02-24 ENCOUNTER — Ambulatory Visit (INDEPENDENT_AMBULATORY_CARE_PROVIDER_SITE_OTHER): Payer: BC Managed Care – PPO | Admitting: *Deleted

## 2014-02-24 DIAGNOSIS — I4901 Ventricular fibrillation: Secondary | ICD-10-CM

## 2014-02-24 LAB — MDC_IDC_ENUM_SESS_TYPE_INCLINIC
HighPow Impedance: 67.5 Ohm
Implantable Pulse Generator Serial Number: 819791
Lead Channel Pacing Threshold Amplitude: 0.5 V
Lead Channel Pacing Threshold Pulse Width: 0.5 ms
Lead Channel Setting Pacing Amplitude: 2.5 V
Lead Channel Setting Pacing Pulse Width: 0.5 ms
Lead Channel Setting Sensing Sensitivity: 0.5 mV
MDC IDC MSMT BATTERY REMAINING LONGEVITY: 80.4 mo
MDC IDC MSMT LEADCHNL RV IMPEDANCE VALUE: 437.5 Ohm
MDC IDC MSMT LEADCHNL RV PACING THRESHOLD AMPLITUDE: 0.5 V
MDC IDC MSMT LEADCHNL RV PACING THRESHOLD PULSEWIDTH: 0.5 ms
MDC IDC MSMT LEADCHNL RV SENSING INTR AMPL: 11.7 mV
MDC IDC SESS DTM: 20150730132428
MDC IDC STAT BRADY RV PERCENT PACED: 0 %
Zone Setting Detection Interval: 310 ms
Zone Setting Detection Interval: 350 ms

## 2014-02-24 NOTE — Progress Notes (Signed)
ICD check in clinic. Normal device function. Thresholds and sensing consistent with previous device measurements. Impedance trends stable over time. No evidence of any ventricular arrhythmias. Histogram distribution appropriate for patient and level of activity. No changes made this session. Device programmed at appropriate safety margins. Device programmed to optimize intrinsic conduction. Estimated longevity 6.7 years. Pt enrolled in remote follow-up. Plan to check device every 3 months remotely and in office annually. Patient education completed including shock plan. Alert tones/vibration demonstrated for patient.  Merlin 05/30/14.  Checked by Armed forces training and education officer for research.

## 2014-03-09 ENCOUNTER — Encounter: Payer: Self-pay | Admitting: Internal Medicine

## 2014-03-21 ENCOUNTER — Encounter: Payer: Self-pay | Admitting: Vascular Surgery

## 2014-03-22 ENCOUNTER — Ambulatory Visit (INDEPENDENT_AMBULATORY_CARE_PROVIDER_SITE_OTHER): Payer: BC Managed Care – PPO | Admitting: Vascular Surgery

## 2014-03-22 ENCOUNTER — Encounter: Payer: Self-pay | Admitting: Vascular Surgery

## 2014-03-22 VITALS — BP 157/79 | HR 71 | Resp 16 | Ht 67.5 in | Wt 212.0 lb

## 2014-03-22 DIAGNOSIS — L98499 Non-pressure chronic ulcer of skin of other sites with unspecified severity: Secondary | ICD-10-CM

## 2014-03-22 DIAGNOSIS — I739 Peripheral vascular disease, unspecified: Secondary | ICD-10-CM

## 2014-03-22 DIAGNOSIS — I70209 Unspecified atherosclerosis of native arteries of extremities, unspecified extremity: Secondary | ICD-10-CM

## 2014-03-22 NOTE — Progress Notes (Signed)
Subjective:     Patient ID: Eugene [name], male   DOB: 1956-01-05, 58 y.o.   MRN: [id number]  HPI this 58 year old male with type 1 diabetes mellitus was seen by me a few months ago for an ulceration in the right ankle area. He underwent angiography by Dr. Trula Slade which revealed severe tibial occlusive disease. The only runoff on the right side was a peroneal artery beginning in the mid calf. He does have a widely patent system down to the popliteal artery on both sides. The ulceration has healed with conservative management. He does develop claudication in both calves after walking about 2 blocks. He does not smoke. He denies any rest pain.  Past Medical History  Diagnosis Date  . Coronary artery disease   . VF (ventricular fibrillation)     arrest  . Diabetes mellitus     type 2  . Hypertension   . Dyslipidemia   . Obesity   . Asthma   . Benign neoplasm of colon- adenoma 01/12/2014  . CHF (congestive heart failure)   . Myocardial infarction 2012  . Pacemaker   . Tubular adenoma of colon 01/12/2014    transverse colon  . Diverticular disease 01/12/14    mild    History  Substance Use Topics  . Smoking status: Never Smoker   . Smokeless tobacco: Never Used  . Alcohol Use: No    Family History  Problem Relation Age of Onset  . Coronary artery disease Father     early 57's  . Diabetes Father   . Hypertension Father   . Colon cancer Mother 35  . Diabetes Sister   . Cancer Brother   . Heart disease Brother   . Breast cancer Mother   . Glaucoma Mother     No Known Allergies  Current outpatient prescriptions:aspirin 81 MG tablet, Take 81 mg by mouth daily.  , Disp: , Rfl: ;  CRESTOR 20 MG tablet, TAKE ONE TABLET BY MOUTH DAILY, Disp: 30 tablet, Rfl: 6;  ergocalciferol (VITAMIN D2) 50000 UNITS capsule, Take 50,000 Units by mouth every 3 (three) months. , Disp: , Rfl: ;  insulin glargine (LANTUS SOLOSTAR) 100 UNIT/ML injection, Inject 62 Units into the skin daily. , Disp: , Rfl:   Liraglutide (VICTOZA [place]), Inject 1.8 Units into the skin daily. , Disp: , Rfl: ;  metFORMIN (GLUCOPHAGE) 500 MG tablet, Take 1,000 mg by mouth 2 (two) times daily with a meal. , Disp: , Rfl: ;  metoprolol (LOPRESSOR) 50 MG tablet, Take 50 mg by mouth 2 (two) times daily., Disp: , Rfl: ;  metoprolol (LOPRESSOR) 50 MG tablet, TAKE ONE TABLET BY MOUTH TWICE DAILY, Disp: 60 tablet, Rfl: 6 nitroGLYCERIN (NITROSTAT) 0.4 MG SL tablet, Place 0.4 mg under the tongue every 5 (five) minutes as needed for chest pain., Disp: , Rfl: ;  ramipril (ALTACE) 2.5 MG capsule, Take 2.5 mg by mouth daily.  , Disp: , Rfl: ;  rosuvastatin (CRESTOR) 20 MG tablet, Take 20 mg by mouth daily., Disp: , Rfl:   BP 157/79  Pulse 71  Resp 16  Ht 5' 7.5" (1.715 m)  Wt 212 lb (96.163 kg)  BMI 32.69 kg/m2  Body mass index is 32.69 kg/(m^2).           Review of Systems denies chest pain. Does have an implantable from earlier followed by Dr. Lovena Le. Has mild dyspnea on exertion. Other systems negative and a complete review of system     Objective:  Physical Exam BP 157/79  Pulse 71  Resp 16  Ht 5' 7.5" (1.715 m)  Wt 212 lb (96.163 kg)  BMI 32.69 kg/m2  Gen.-alert and oriented x3 in no apparent distress-obese HEENT normal for age Lungs no rhonchi or wheezing Cardiovascular regular rhythm no murmurs carotid pulses 3+ palpable no bruits audible Abdomen soft nontender no palpable masses Musculoskeletal free of  major deformities Skin clear -no rashes Neurologic normal Lower extremities 3+ femoral and popliteal pulse palpable bilaterally. No distal pulses palpable in either lower extremity. Ulceration near right medial malleolus completely healed. No evidence of any infection or gangrene.  I reviewed the previous angiogram done by Dr. Trula Slade 12/14/2013.       Assessment:     Previous ulceration right leg which is now healed in patient with diabetes mellitus type 1 and significant bilateral tibial occlusive  disease with claudication    Plan:     Would not recommend revascularization unless patient has limb threatening ischemia. Would require popliteal to peroneal bypass into the lower half of the leg on the right. Discussed this with patient and his wife and they understand. He'll return to see [place] on a when necessary basis if symptoms worsen

## 2014-04-14 ENCOUNTER — Encounter: Payer: Self-pay | Admitting: Internal Medicine

## 2014-04-18 ENCOUNTER — Encounter: Payer: Self-pay | Admitting: Internal Medicine

## 2014-04-18 ENCOUNTER — Ambulatory Visit: Payer: BC Managed Care – PPO | Admitting: Internal Medicine

## 2014-04-18 VITALS — BP 120/76 | HR 72 | Ht 66.25 in | Wt 216.0 lb

## 2014-04-18 DIAGNOSIS — D126 Benign neoplasm of colon, unspecified: Secondary | ICD-10-CM

## 2014-04-18 DIAGNOSIS — Z800 Family history of malignant neoplasm of digestive organs: Secondary | ICD-10-CM

## 2014-04-18 DIAGNOSIS — D123 Benign neoplasm of transverse colon: Secondary | ICD-10-CM

## 2014-04-18 NOTE — Patient Instructions (Addendum)
You have been scheduled for a colonoscopy. Please follow written instructions given to you at your visit today.  If you use inhalers (even only as needed), please bring them with you on the day of your procedure. Your physician has requested that you go to www.[email or web address].com and enter the access code given to you at your visit today. This web site gives a general overview about your procedure. However, you should still follow specific instructions given to you by our office regarding your preparation for the procedure.  Thank you for the opportunity to treat you today.

## 2014-04-18 NOTE — Progress Notes (Signed)
   Subjective:    Patient ID: Eugene [name], male    DOB: [birth year], 58 y.o.   MRN: [id number]  HPI The patient is a 58 year old white man there had a screening colonoscopy in June of 2015 performed by Dr. Marian Sorrow in [place]. He had a flat adenoma the transverse colon, was biopsied but not removed. There was another area in the splenic flexure that was biopsied was not a polyp. The endoscopist reported that the prep in the right colon was suboptimal in some areas. The patient denies active GI symptoms at this time. 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 Medications, allergies, past medical history, past surgical history, family history and social history are reviewed and updated in the EMR.   Review of Systems As per history of present illness. Followed by her cardiologist. He had a V. fib arrest in 2012, his ejection fraction was 70% of the time. He does of a pacemaker defibrillator inserted.    Objective:   Physical Exam General:  NAD, obese Eyes:   anicteric Lungs:  clear Heart:  S1S2 no rubs, murmurs or gallops Abdomen:  soft and nontender, BS+ no organomegaly or mass there is diastasis recti Ext:   no edema    Data Reviewed:  Colonoscopy and pathology reports from June 2015.     Assessment & Plan:  Benign neoplasm of colon- adenoma I recommended a repeat colonoscopy to remove the polyp that was only biopsied.The risks and benefits as well as alternatives of endoscopic procedure(s) have been discussed and reviewed. All questions answered. The patient agrees to proceed.   Family history of colon cancer in mother - 62 Depending upon what is seen at this colonoscopy will likely need a repeat colonoscopy routinely in 5 years.

## 2014-04-18 NOTE — Assessment & Plan Note (Signed)
Depending upon what is seen at this colonoscopy will likely need a repeat colonoscopy routinely in 5 years.

## 2014-04-18 NOTE — Assessment & Plan Note (Signed)
I recommended a repeat colonoscopy to remove the polyp that was only biopsied.The risks and benefits as well as alternatives of endoscopic procedure(s) have been discussed and reviewed. All questions answered. The patient agrees to proceed.

## 2014-04-26 ENCOUNTER — Encounter: Payer: Self-pay | Admitting: Internal Medicine

## 2014-04-26 NOTE — Telephone Encounter (Signed)
Appt sched 05-03-14/yf

## 2014-05-03 ENCOUNTER — Ambulatory Visit (AMBULATORY_SURGERY_CENTER): Payer: BC Managed Care – PPO | Admitting: Internal Medicine

## 2014-05-03 ENCOUNTER — Encounter: Payer: Self-pay | Admitting: Internal Medicine

## 2014-05-03 VITALS — BP 140/82 | HR 72 | Temp 97.2°F | Resp 13 | Ht 66.25 in | Wt 216.0 lb

## 2014-05-03 DIAGNOSIS — D123 Benign neoplasm of transverse colon: Secondary | ICD-10-CM

## 2014-05-03 DIAGNOSIS — D126 Benign neoplasm of colon, unspecified: Secondary | ICD-10-CM

## 2014-05-03 DIAGNOSIS — K5730 Diverticulosis of large intestine without perforation or abscess without bleeding: Secondary | ICD-10-CM

## 2014-05-03 LAB — GLUCOSE, CAPILLARY
GLUCOSE-CAPILLARY: 115 mg/dL — AB (ref 70–99)
Glucose-Capillary: 129 mg/dL — ABNORMAL HIGH (ref 70–99)

## 2014-05-03 MED ORDER — SODIUM CHLORIDE 0.9 % IV SOLN: 500.0000 mL | INTRAVENOUS | Status: DC

## 2014-05-03 NOTE — Progress Notes (Signed)
Called to room to assist during endoscopic procedure.  Patient ID and intended procedure confirmed with present staff. Received instructions for my participation in the procedure from the performing physician.  

## 2014-05-03 NOTE — Progress Notes (Signed)
Procedure ends, to recovery, nreport given and VSS.

## 2014-05-03 NOTE — Op Note (Signed)
[place]  [address] [place], [address]   COLONOSCOPY PROCEDURE REPORT  PATIENT: Eugene [name], Eugene [name]  MR#: [id number] BIRTHDATE: 07/04/1956 , 57  yrs. old GENDER: male ENDOSCOPIST: Gatha Mayer, MD, [facility] PROCEDURE DATE:  05/03/2014 PROCEDURE:   Colonoscopy with snare polypectomy First Screening Colonoscopy - Avg.  risk and is 50 yrs.  old or older - No.  Prior Negative Screening - Now for repeat screening. N/A  History of Adenoma - Now for follow-up colonoscopy & has been > or = to 3 yrs.  No.  It has been less than 3 yrs since last colonoscopy.  Medical reason. ASA CLASS:   Class III INDICATIONS:excision of colonic polyps. MEDICATIONS: Monitored anesthesia care and Propofol 240 mg IV DESCRIPTION OF PROCEDURE:   After the risks benefits and alternatives of the procedure were thoroughly explained, informed consent was obtained.  The digital rectal exam revealed no abnormalities of the rectum, revealed no prostatic nodules, and revealed the prostate was not enlarged.   The LB [id number] endoscope was introduced through the anus and advanced to the cecum, which was identified by both the appendix and ileocecal valve. No adverse events experienced.   The quality of the prep was good, using MiraLax  The instrument was then slowly withdrawn as the colon was fully examined.  COLON FINDINGS: A sessile polyp measuring 7 mm in size was found in the proximal transverse colon.  A polypectomy was performed in a piecemeal fashion with a cold snare.  The resection was complete, the polyp tissue was completely retrieved and sent to histology. A sessile polyp measuring 15 mm in size was found in the distal transverse colon.  A polypectomy was performed in a piecemeal fashion with a cold snare.  The resection was complete, the polyp tissue was completely retrieved and sent to histology.   There was mild diverticulosis noted in the sigmoid colon.   The examination was  otherwise normal.  Retroflexed views revealed no abnormalities. The time to cecum=2 minutes 07 seconds.  Withdrawal time=22 minutes 0 seconds.  The scope was withdrawn and the procedure completed. COMPLICATIONS: There were no immediate complications.  ENDOSCOPIC IMPRESSION: 1.   Sessile polyp (84mm) was found in the proximal transverse colon; polypectomy was performed in a piecemeal fashion with a cold snare 2.   Sessile polyp (72mm) was found in the distal transverse colon; polypectomy was performed in a piecemeal fashion with a cold snare 3.   Mild diverticulosis was noted in the sigmoid colon 4.   The examination was otherwise normal - good prep  RECOMMENDATIONS: Timing of repeat colonoscopy will be determined by pathology findings.  eSigned:  Gatha Mayer, MD, [facility] 05/03/2014 3:27 PM  cc: The Patient and Raelene Bott, MD

## 2014-05-03 NOTE — Patient Instructions (Signed)
YOU HAD AN ENDOSCOPIC PROCEDURE TODAY AT THE [facility]: Refer to the procedure report that was given to you for any specific questions about what was found during the examination.  If the procedure report does not answer your questions, please call your gastroenterologist to clarify.  If you requested that your care partner not be given the details of your procedure findings, then the procedure report has been included in a sealed envelope for you to review at your convenience later.  YOU SHOULD EXPECT: Some feelings of bloating in the abdomen. Passage of more gas than usual.  Walking can help get rid of the air that was put into your GI tract during the procedure and reduce the bloating. If you had a lower endoscopy (such as a colonoscopy or flexible sigmoidoscopy) you may notice spotting of blood in your stool or on the toilet paper. If you underwent a bowel prep for your procedure, then you may not have a normal bowel movement for a few days.  DIET: Your first meal following the procedure should be a light meal and then it is ok to progress to your normal diet.  A half-sandwich or bowl of soup is an example of a good first meal.  Heavy or fried foods are harder to digest and may make you feel nauseous or bloated.  Likewise meals heavy in dairy and vegetables can cause extra gas to form and this can also increase the bloating.  Drink plenty of fluids but you should avoid alcoholic beverages for 24 hours.  ACTIVITY: Your care partner should take you home directly after the procedure.  You should plan to take it easy, moving slowly for the rest of the day.  You can resume normal activity the day after the procedure however you should NOT DRIVE or use heavy machinery for 24 hours (because of the sedation medicines used during the test).    SYMPTOMS TO REPORT IMMEDIATELY: A gastroenterologist can be reached at any hour.  During normal business hours, 8:30 AM to 5:00 PM Monday through Friday,  call [phone].  After hours and on weekends, please call the GI answering service at [phone] who will take a message and have the physician on call contact you.   Following lower endoscopy (colonoscopy or flexible sigmoidoscopy):  Excessive amounts of blood in the stool  Significant tenderness or worsening of abdominal pains  Swelling of the abdomen that is new, acute  Fever of 100F or higher  FOLLOW UP: If any biopsies were taken you will be contacted by phone or by letter within the next 1-3 weeks.  Call your gastroenterologist if you have not heard about the biopsies in 3 weeks.  Our staff will call the home number listed on your records the next business day following your procedure to check on you and address any questions or concerns that you may have at that time regarding the information given to you following your procedure. This is a courtesy call and so if there is no answer at the home number and we have not heard from you through the emergency physician on call, we will assume that you have returned to your regular daily activities without incident.  SIGNATURES/CONFIDENTIALITY: You and/or your care partner have signed paperwork which will be entered into your electronic medical record.  These signatures attest to the fact that that the information above on your After Visit Summary has been reviewed and is understood.  Full responsibility of the confidentiality of this   discharge information lies with you and/or your care-partner.  Recommendations Next colonoscopy determined by pathology results. Handouts provided to patient and caregiver on diverticulosis and polyps.

## 2014-05-04 ENCOUNTER — Telehealth: Payer: Self-pay

## 2014-05-04 NOTE — Telephone Encounter (Signed)
  Follow up Call-  Call back number 05/03/2014  Post procedure Call Back phone  # [phone]  Permission to leave phone message Yes     Patient questions:  Do you have a fever, pain , or abdominal swelling? No. Pain Score  0 *  Have you tolerated food without any problems? Yes.    Have you been able to return to your normal activities? Yes.    Do you have any questions about your discharge instructions: Diet   No. Medications  No. Follow up visit  No.  Do you have questions or concerns about your Care? No.  Actions: * If pain score is 4 or above: No action needed, pain <4.  Per the pt, "every thing is fine". maw

## 2014-05-11 ENCOUNTER — Encounter: Payer: Self-pay | Admitting: Internal Medicine

## 2014-05-11 NOTE — Progress Notes (Signed)
Quick Note:  2 adenomas - repeat colon 2018 ______

## 2014-05-30 ENCOUNTER — Ambulatory Visit (INDEPENDENT_AMBULATORY_CARE_PROVIDER_SITE_OTHER): Payer: BC Managed Care – PPO | Admitting: *Deleted

## 2014-05-30 ENCOUNTER — Encounter: Payer: Self-pay | Admitting: Internal Medicine

## 2014-05-30 DIAGNOSIS — I4901 Ventricular fibrillation: Secondary | ICD-10-CM

## 2014-05-30 LAB — MDC_IDC_ENUM_SESS_TYPE_REMOTE
Brady Statistic RV Percent Paced: 1 %
HIGH POWER IMPEDANCE MEASURED VALUE: 73 Ohm
Lead Channel Impedance Value: 450 Ohm
Lead Channel Sensing Intrinsic Amplitude: 11.7 mV
Lead Channel Setting Pacing Pulse Width: 0.5 ms
MDC IDC MSMT BATTERY REMAINING LONGEVITY: 76 mo
MDC IDC PG SERIAL: 819791
MDC IDC SET LEADCHNL RV PACING AMPLITUDE: 2.5 V
MDC IDC SET LEADCHNL RV SENSING SENSITIVITY: 0.5 mV
Zone Setting Detection Interval: 310 ms
Zone Setting Detection Interval: 350 ms

## 2014-05-30 NOTE — Progress Notes (Signed)
Remote ICD transmission.   

## 2014-06-02 DIAGNOSIS — Z8673 Personal history of transient ischemic attack (TIA), and cerebral infarction without residual deficits: Secondary | ICD-10-CM | POA: Insufficient documentation

## 2014-06-16 ENCOUNTER — Encounter: Payer: Self-pay | Admitting: Cardiology

## 2014-07-07 ENCOUNTER — Encounter (HOSPITAL_COMMUNITY): Payer: Self-pay | Admitting: Surgery

## 2014-07-25 ENCOUNTER — Other Ambulatory Visit: Payer: Self-pay | Admitting: Internal Medicine

## 2014-08-09 ENCOUNTER — Encounter: Payer: Self-pay | Admitting: Internal Medicine

## 2014-08-09 ENCOUNTER — Ambulatory Visit (INDEPENDENT_AMBULATORY_CARE_PROVIDER_SITE_OTHER): Payer: BLUE CROSS/BLUE SHIELD | Admitting: Internal Medicine

## 2014-08-09 VITALS — BP 132/78 | HR 74 | Ht 67.0 in | Wt 212.8 lb

## 2014-08-09 DIAGNOSIS — I10 Essential (primary) hypertension: Secondary | ICD-10-CM

## 2014-08-09 DIAGNOSIS — G458 Other transient cerebral ischemic attacks and related syndromes: Secondary | ICD-10-CM

## 2014-08-09 DIAGNOSIS — G459 Transient cerebral ischemic attack, unspecified: Secondary | ICD-10-CM | POA: Insufficient documentation

## 2014-08-09 DIAGNOSIS — I4901 Ventricular fibrillation: Secondary | ICD-10-CM

## 2014-08-09 DIAGNOSIS — Z95810 Presence of automatic (implantable) cardiac defibrillator: Secondary | ICD-10-CM

## 2014-08-09 HISTORY — DX: Transient cerebral ischemic attack, unspecified: G45.9

## 2014-08-09 LAB — MDC_IDC_ENUM_SESS_TYPE_INCLINIC
Battery Remaining Longevity: 75.6 mo
Brady Statistic RV Percent Paced: 0 %
Date Time Interrogation Session: 20160112160622
HIGH POWER IMPEDANCE MEASURED VALUE: 74.25 Ohm
Implantable Pulse Generator Serial Number: 819791
Lead Channel Impedance Value: 450 Ohm
Lead Channel Pacing Threshold Pulse Width: 0.5 ms
Lead Channel Sensing Intrinsic Amplitude: 11.7 mV
Lead Channel Setting Pacing Amplitude: 2.5 V
MDC IDC MSMT LEADCHNL RV PACING THRESHOLD AMPLITUDE: 0.75 V
MDC IDC MSMT LEADCHNL RV PACING THRESHOLD AMPLITUDE: 0.75 V
MDC IDC MSMT LEADCHNL RV PACING THRESHOLD PULSEWIDTH: 0.5 ms
MDC IDC SET LEADCHNL RV PACING PULSEWIDTH: 0.5 ms
MDC IDC SET LEADCHNL RV SENSING SENSITIVITY: 0.5 mV
Zone Setting Detection Interval: 310 ms
Zone Setting Detection Interval: 350 ms

## 2014-08-09 NOTE — Assessment & Plan Note (Signed)
This is a new problem. He actually had a stroke with resolution of all neuro deficits based on strict criteria in that his symptoms lasted for over 24 hours. I discussed the possible etiologies of his stroke and recommended insertion of an ILR as his risk of atrial fib is quite high. Will schedule ILR insertion in the next few weeks.

## 2014-08-09 NOTE — Assessment & Plan Note (Signed)
He has had no recurrent ventricular arrhythmias. Will follow.

## 2014-08-09 NOTE — Progress Notes (Signed)
HPI Eugene [name] returns today for followup. He is a very pleasant 59 year old man who sustained a ventricular fibrillation cardiac arrest now over three years ago. He has returned to work and continues to do quite well. The etiology of his arrest is still unclear. He has coronary disease with preserved left ventricular function. He is status post ICD implantation as there was not a reversible cause for his VF arrest. He has had no recurrent ICD shocks. 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 He works up to 70 hours a week. He has remained active despite his hypertension and diabetes. No syncope. No peripheral edema. He has very little daytime somnolence. He notes an unexplained stroke 2 months ago for which no obvious etiology was found. He did not have a neuro MRI because of his ICD. He notes that his A1C is elevated. He is trying to do better with his diet. He does not have palpitations. No Known Allergies   Current Outpatient Prescriptions  Medication Sig Dispense Refill  . aspirin 81 MG tablet Take 81 mg by mouth daily.      . clopidogrel (PLAVIX) 75 MG tablet Take 75 mg by mouth daily.    . CRESTOR 20 MG tablet TAKE ONE TABLET BY MOUTH DAILY 30 tablet 5  . ergocalciferol (VITAMIN D2) 50000 UNITS capsule Take 50,000 Units by mouth every 3 (three) months.     . insulin glargine (LANTUS SOLOSTAR) 100 UNIT/ML injection Inject 65 Units into the skin daily.     . Liraglutide (VICTOZA [place]) Inject 1.8 Units into the skin daily.     . metFORMIN (GLUCOPHAGE) 500 MG tablet Take 1,000 mg by mouth 2 (two) times daily with a meal.     . metoprolol (LOPRESSOR) 50 MG tablet TAKE ONE TABLET BY MOUTH TWICE DAILY 60 tablet 5  . nitroGLYCERIN (NITROSTAT) 0.4 MG SL tablet Place 0.4 mg under the tongue every 5 (five) minutes as needed for chest pain.    . ramipril (ALTACE) 2.5 MG capsule Take 2.5 mg by mouth daily.       No current facility-administered medications for this visit.     Past  Medical History  Diagnosis Date  . Coronary artery disease   . VF (ventricular fibrillation)     arrest  . Diabetes mellitus     type 2  . Hypertension   . Dyslipidemia   . Obesity   . Asthma   . Benign neoplasm of colon- adenoma 01/12/2014  . CHF (congestive heart failure)   . Myocardial infarction 2012  . Pacemaker   . Tubular adenoma of colon 01/12/2014    transverse colon  . Diverticular disease 01/12/14    mild    ROS:   All systems reviewed and negative except as noted in the HPI.   Past Surgical History  Procedure Laterality Date  . Sternectomy    . Cardiac catheterization  09/16/10  . Coronary artery bypass graft  2006  . Cardiac pacemaker placement  2012  . Mass excision  2008    right shoulder  . Colonoscopy  01/12/2014  . Abdominal aortagram N/A 12/14/2013    Procedure: ABDOMINAL Maxcine Ham;  Surgeon: [name], MD;  Location: [facility] CATH LAB;  Service: Cardiovascular;  Laterality: N/A;     Family History  Problem Relation Age of Onset  . Coronary artery disease Father     early 65's  . Diabetes Father   . Hypertension Father   . Breast cancer Mother 101  mets to colon and other  . Diabetes Sister   . Prostate cancer Brother   . Heart disease Brother   . Glaucoma Mother      History   Social History  . Marital Status: Single    Spouse Name: N/A    Number of Children: 0  . Years of Education: N/A   Occupational History  . [employer]   Social History Main Topics  . Smoking status: Never Smoker   . Smokeless tobacco: Never Used  . Alcohol Use: No  . Drug Use: No  . Sexual Activity: Not on file   Other Topics Concern  . 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 Well-healed ICD incision. HEART:  Regular rate rhythm, no murmurs, no rubs, no clicks Abd:  soft, positive bowel sounds, no organomegally, no rebound, no guarding Ext:  2 plus pulses, no edema, no cyanosis, no clubbing Skin:  No rashes no nodules Neuro:  CN II through XII intact, motor grossly intact  EKG Normal sinus rhythm with incomplete right bundle branch block. DEVICE  Normal device function.  See PaceArt for details.   Assess/Plan:

## 2014-08-09 NOTE — Assessment & Plan Note (Signed)
His blood pressure is well controlled. He will continue his current meds. 

## 2014-08-09 NOTE — Patient Instructions (Addendum)
  Check in at the [facility] on 08/24/14 at 7:00am  Your physician recommends that you schedule a follow-up appointment in: 7-10 days from 08/24/14 in device clinic for wound check  Your physician wants you to follow-up in: 12 months with Dr. Knox Saliva will receive a reminder letter in the mail two months in advance. If you don't receive a letter, please call our office to schedule the follow-up appointment.  Remote monitoring is used to monitor your Pacemaker or ICD from home. This monitoring reduces the number of office visits required to check your device to one time per year. It allows [place] to keep an eye on the functioning of your device to ensure it is working properly. You are scheduled for a device check from home on 11/08/14. You may send your transmission at any time that day. If you have a wireless device, the transmission will be sent automatically. After your physician reviews your transmission, you will receive a postcard with your next transmission date.

## 2014-08-09 NOTE — Assessment & Plan Note (Signed)
His St. Jude ICD is working normally. Unfortunately he does not have an atrial lead and will require ILR to help evaluate his potential for atrial fib which has not been diagnosed.

## 2014-08-20 ENCOUNTER — Encounter: Payer: Self-pay | Admitting: *Deleted

## 2014-08-24 ENCOUNTER — Encounter (HOSPITAL_COMMUNITY)
Admission: RE | Disposition: A | Discharge status: Home / Self Care | Payer: Self-pay | Source: Ambulatory Visit | Attending: Internal Medicine

## 2014-08-24 ENCOUNTER — Encounter (HOSPITAL_COMMUNITY): Payer: Self-pay | Admitting: Internal Medicine

## 2014-08-24 ENCOUNTER — Ambulatory Visit (HOSPITAL_COMMUNITY)
Admission: RE | Admit: 2014-08-24 | Discharge: 2014-08-24 | Disposition: A | Discharge status: Home / Self Care | Payer: BLUE CROSS/BLUE SHIELD | Source: Ambulatory Visit | Attending: Internal Medicine | Admitting: Internal Medicine

## 2014-08-24 DIAGNOSIS — E669 Obesity, unspecified: Secondary | ICD-10-CM | POA: Insufficient documentation

## 2014-08-24 DIAGNOSIS — Z7982 Long term (current) use of aspirin: Secondary | ICD-10-CM | POA: Insufficient documentation

## 2014-08-24 DIAGNOSIS — J45909 Unspecified asthma, uncomplicated: Secondary | ICD-10-CM | POA: Diagnosis not present

## 2014-08-24 DIAGNOSIS — Z95810 Presence of automatic (implantable) cardiac defibrillator: Secondary | ICD-10-CM | POA: Insufficient documentation

## 2014-08-24 DIAGNOSIS — I252 Old myocardial infarction: Secondary | ICD-10-CM | POA: Diagnosis not present

## 2014-08-24 DIAGNOSIS — Z951 Presence of aortocoronary bypass graft: Secondary | ICD-10-CM | POA: Insufficient documentation

## 2014-08-24 DIAGNOSIS — Z7902 Long term (current) use of antithrombotics/antiplatelets: Secondary | ICD-10-CM | POA: Insufficient documentation

## 2014-08-24 DIAGNOSIS — E785 Hyperlipidemia, unspecified: Secondary | ICD-10-CM | POA: Diagnosis not present

## 2014-08-24 DIAGNOSIS — Z6833 Body mass index (BMI) 33.0-33.9, adult: Secondary | ICD-10-CM | POA: Diagnosis not present

## 2014-08-24 DIAGNOSIS — E119 Type 2 diabetes mellitus without complications: Secondary | ICD-10-CM | POA: Insufficient documentation

## 2014-08-24 DIAGNOSIS — Z950 Presence of cardiac pacemaker: Secondary | ICD-10-CM | POA: Insufficient documentation

## 2014-08-24 DIAGNOSIS — Z794 Long term (current) use of insulin: Secondary | ICD-10-CM | POA: Insufficient documentation

## 2014-08-24 DIAGNOSIS — I639 Cerebral infarction, unspecified: Secondary | ICD-10-CM

## 2014-08-24 DIAGNOSIS — I509 Heart failure, unspecified: Secondary | ICD-10-CM | POA: Diagnosis not present

## 2014-08-24 DIAGNOSIS — I10 Essential (primary) hypertension: Secondary | ICD-10-CM | POA: Diagnosis not present

## 2014-08-24 DIAGNOSIS — I2510 Atherosclerotic heart disease of native coronary artery without angina pectoris: Secondary | ICD-10-CM | POA: Diagnosis not present

## 2014-08-24 HISTORY — PX: LOOP RECORDER IMPLANT: SHX5477

## 2014-08-24 LAB — GLUCOSE, CAPILLARY: Glucose-Capillary: 207 mg/dL — ABNORMAL HIGH (ref 70–99)

## 2014-08-24 SURGERY — LOOP RECORDER IMPLANT

## 2014-08-24 MED ORDER — LIDOCAINE-EPINEPHRINE 1 %-1:100000 IJ SOLN
INTRAMUSCULAR | Status: AC
  Filled 2014-08-24: qty 1

## 2014-08-24 NOTE — CV Procedure (Signed)
EP Procedure Note  Procedure: Insertion of an ILR  Preprocedure diagnosis: cryptogenic stroke  Postprocedure diagnosis: cryptogenic stroke  Description of the procedure: after informed consent was obtained, the patient was prepped and draped in the usual fashion. 20 cc of lidocaine was infiltrated into the left pectoral region. A one cm stab incision was made and the Medtronic ILR, serial [id number] was inserted. The R waves were 0.20-0.25 Volts. Benzoin and steristrips were painted on the skin.   Complications: none  EBL: less than 5 cc  Conclusion: successful insertion of a Medtronic ILR in a patient with cryptogenic stroke.  Mikle Bosworth.D.

## 2014-08-25 NOTE — H&P (Signed)
Expand All Collapse All   HPI Eugene [name] returns today for followup. He is a very pleasant 59 year old man who sustained a ventricular fibrillation cardiac arrest now over three years ago. He has returned to work and continues to do quite well. The etiology of his arrest is still unclear. He has coronary disease with preserved left ventricular function. He is status post ICD implantation as there was not a reversible cause for his VF arrest. He has had no recurrent ICD shocks. 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 He works up to 70 hours a week. He has remained active despite his hypertension and diabetes. No syncope. No peripheral edema. He has very little daytime somnolence. He notes an unexplained stroke 2 months ago for which no obvious etiology was found. He did not have a neuro MRI because of his ICD. He notes that his A1C is elevated. He is trying to do better with his diet. He does not have palpitations. No Known Allergies   Current Outpatient Prescriptions  Medication Sig Dispense Refill  . aspirin 81 MG tablet Take 81 mg by mouth daily.     . clopidogrel (PLAVIX) 75 MG tablet Take 75 mg by mouth daily.    . CRESTOR 20 MG tablet TAKE ONE TABLET BY MOUTH DAILY 30 tablet 5  . ergocalciferol (VITAMIN D2) 50000 UNITS capsule Take 50,000 Units by mouth every 3 (three) months.     . insulin glargine (LANTUS SOLOSTAR) 100 UNIT/ML injection Inject 65 Units into the skin daily.     . Liraglutide (VICTOZA [place]) Inject 1.8 Units into the skin daily.     . metFORMIN (GLUCOPHAGE) 500 MG tablet Take 1,000 mg by mouth 2 (two) times daily with a meal.     . metoprolol (LOPRESSOR) 50 MG tablet TAKE ONE TABLET BY MOUTH TWICE DAILY 60 tablet 5  . nitroGLYCERIN (NITROSTAT) 0.4 MG SL tablet Place 0.4 mg under the tongue every 5 (five) minutes as needed for chest pain.    . ramipril (ALTACE) 2.5 MG capsule Take 2.5 mg by mouth  daily.      No current facility-administered medications for this visit.     Past Medical History  Diagnosis Date  . Coronary artery disease   . VF (ventricular fibrillation)     arrest  . Diabetes mellitus     type 2  . Hypertension   . Dyslipidemia   . Obesity   . Asthma   . Benign neoplasm of colon- adenoma 01/12/2014  . CHF (congestive heart failure)   . Myocardial infarction 2012  . Pacemaker   . Tubular adenoma of colon 01/12/2014    transverse colon  . Diverticular disease 01/12/14    mild    ROS:  All systems reviewed and negative except as noted in the HPI.   Past Surgical History  Procedure Laterality Date  . Sternectomy    . Cardiac catheterization  09/16/10  . Coronary artery bypass graft  2006  . Cardiac pacemaker placement  2012  . Mass excision  2008    right shoulder  . Colonoscopy  01/12/2014  . Abdominal aortagram N/A 12/14/2013    Procedure: ABDOMINAL Maxcine Ham; Surgeon: [name], MD; Location: [facility] CATH LAB; Service: Cardiovascular; Laterality: N/A;     Family History  Problem Relation Age of Onset  . Coronary artery disease Father     early 7's  . Diabetes Father   . Hypertension Father   . Breast cancer Mother 26  mets to colon and other  . Diabetes Sister   . Prostate cancer Brother   . Heart disease Brother   . Glaucoma Mother      History   Social History  . Marital Status: Single    Spouse Name: N/A    Number of Children: 0  . Years of Education: N/A   Occupational History  . [employer]   Social History Main Topics  . Smoking status: Never Smoker   . Smokeless tobacco: Never Used  . Alcohol Use: No  . Drug Use: No  . Sexual Activity: Not on file   Other Topics Concern  . 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 Well-healed ICD incision. HEART: Regular rate rhythm, no murmurs, no rubs, no clicks Abd: soft, positive bowel sounds, no organomegally, no rebound, no guarding Ext: 2 plus pulses, no edema, no cyanosis, no clubbing Skin: No rashes no nodules Neuro: CN II through XII intact, motor grossly intact  EKG Normal sinus rhythm with incomplete right bundle branch block. DEVICE  Normal device function. See PaceArt for details.   Assess/Plan:              TIA (transient ischemic attack) - Eugene Lance, MD at 08/09/2014 6:09 PM     Status: Written Related Problem: TIA (transient ischemic attack)   Expand All Collapse All   This is a new problem. He actually had a stroke with resolution of all neuro deficits based on strict criteria in that his symptoms lasted for over 24 hours. I discussed the possible etiologies of his stroke and recommended insertion of an ILR as his risk of atrial fib is quite high. Will schedule ILR insertion in the next few weeks.             Single implantable cardioverter-defibrillator (ICD) in situ - Eugene Lance, MD at 08/09/2014 6:11 PM     Status: Written Related Problem: Single implantable cardioverter-defibrillator (ICD) in situ   Expand All Collapse All   His St. Jude ICD is working normally. Unfortunately he does not have an atrial lead and will require ILR to help evaluate his potential for atrial fib which has not been diagnosed.            HYPERTENSION, BENIGN - Eugene Lance, MD at 08/09/2014 6:11 PM      Status: Written Related Problem: HYPERTENSION, BENIGN   Expand All Collapse All   His blood pressure is well controlled. He will continue his current meds.            VENTRICULAR FIBRILLATION - Eugene Lance, MD at 08/09/2014 6:12 PM     Status: Written Related Problem: VENTRICULAR FIBRILLATION   Expand All Collapse All   He has had no recurrent ventricular arrhythmias. Will follow       EP Attending  Since prior clinic, note, there has been no change in the history, exam, assessment and plan. He will undergo insertion of an ILR for cryptogenic stroke evaluation.  Eugene [name].D.

## 2014-09-01 ENCOUNTER — Ambulatory Visit (INDEPENDENT_AMBULATORY_CARE_PROVIDER_SITE_OTHER): Payer: BLUE CROSS/BLUE SHIELD | Admitting: *Deleted

## 2014-09-01 DIAGNOSIS — I639 Cerebral infarction, unspecified: Secondary | ICD-10-CM

## 2014-09-01 DIAGNOSIS — I6322 Cerebral infarction due to unspecified occlusion or stenosis of basilar arteries: Secondary | ICD-10-CM

## 2014-09-01 LAB — MDC_IDC_ENUM_SESS_TYPE_INCLINIC: Implantable Pulse Generator Serial Number: 819791

## 2014-09-01 NOTE — Progress Notes (Signed)
Wound check ILR.  Steri strips removed by the patient.  Wound well healed.  R-waves 0.28-0.34mV.  Follow up via [facility].

## 2014-09-16 ENCOUNTER — Encounter: Payer: Self-pay | Admitting: Internal Medicine

## 2014-09-23 ENCOUNTER — Ambulatory Visit (INDEPENDENT_AMBULATORY_CARE_PROVIDER_SITE_OTHER): Payer: BLUE CROSS/BLUE SHIELD | Admitting: *Deleted

## 2014-09-23 DIAGNOSIS — I639 Cerebral infarction, unspecified: Secondary | ICD-10-CM

## 2014-09-23 DIAGNOSIS — I6322 Cerebral infarction due to unspecified occlusion or stenosis of basilar arteries: Secondary | ICD-10-CM

## 2014-09-23 LAB — MDC_IDC_ENUM_SESS_TYPE_REMOTE: Implantable Pulse Generator Serial Number: 819791

## 2014-09-23 NOTE — Progress Notes (Signed)
Loop recorder 

## 2014-10-18 ENCOUNTER — Encounter: Payer: Self-pay | Admitting: Internal Medicine

## 2014-10-24 ENCOUNTER — Ambulatory Visit (INDEPENDENT_AMBULATORY_CARE_PROVIDER_SITE_OTHER): Payer: BLUE CROSS/BLUE SHIELD | Admitting: *Deleted

## 2014-10-24 DIAGNOSIS — I639 Cerebral infarction, unspecified: Secondary | ICD-10-CM

## 2014-10-24 DIAGNOSIS — I6322 Cerebral infarction due to unspecified occlusion or stenosis of basilar arteries: Secondary | ICD-10-CM

## 2014-10-24 LAB — MDC_IDC_ENUM_SESS_TYPE_REMOTE: MDC IDC PG SERIAL: 819791

## 2014-10-25 DIAGNOSIS — L97319 Non-pressure chronic ulcer of right ankle with unspecified severity: Secondary | ICD-10-CM | POA: Insufficient documentation

## 2014-10-25 NOTE — Progress Notes (Signed)
Loop recorder 

## 2014-11-08 ENCOUNTER — Ambulatory Visit (INDEPENDENT_AMBULATORY_CARE_PROVIDER_SITE_OTHER): Payer: BLUE CROSS/BLUE SHIELD | Admitting: *Deleted

## 2014-11-08 DIAGNOSIS — I4901 Ventricular fibrillation: Secondary | ICD-10-CM | POA: Diagnosis not present

## 2014-11-08 NOTE — Progress Notes (Signed)
Remote ICD transmission.   

## 2014-11-13 LAB — MDC_IDC_ENUM_SESS_TYPE_REMOTE
Date Time Interrogation Session: 20160406115904
MDC IDC SET ZONE DETECTION INTERVAL: 2000 ms
Zone Setting Detection Interval: 3000 ms
Zone Setting Detection Interval: 350 ms

## 2014-11-21 ENCOUNTER — Encounter: Payer: Self-pay | Admitting: Cardiology

## 2014-11-22 ENCOUNTER — Ambulatory Visit: Payer: BLUE CROSS/BLUE SHIELD | Admitting: *Deleted

## 2014-11-22 DIAGNOSIS — G458 Other transient cerebral ischemic attacks and related syndromes: Secondary | ICD-10-CM

## 2014-11-24 ENCOUNTER — Encounter: Payer: Self-pay | Admitting: Internal Medicine

## 2014-11-24 NOTE — Progress Notes (Signed)
Loop recorder 

## 2014-11-30 ENCOUNTER — Encounter: Payer: Self-pay | Admitting: Internal Medicine

## 2014-12-22 ENCOUNTER — Ambulatory Visit (INDEPENDENT_AMBULATORY_CARE_PROVIDER_SITE_OTHER): Payer: BLUE CROSS/BLUE SHIELD | Admitting: *Deleted

## 2014-12-22 DIAGNOSIS — I6322 Cerebral infarction due to unspecified occlusion or stenosis of basilar arteries: Secondary | ICD-10-CM

## 2014-12-22 DIAGNOSIS — I639 Cerebral infarction, unspecified: Secondary | ICD-10-CM | POA: Diagnosis not present

## 2014-12-23 NOTE — Progress Notes (Signed)
Loop recorder 

## 2015-01-16 ENCOUNTER — Encounter: Payer: Self-pay | Admitting: Internal Medicine

## 2015-01-17 ENCOUNTER — Other Ambulatory Visit: Payer: Self-pay | Admitting: Internal Medicine

## 2015-01-20 ENCOUNTER — Ambulatory Visit (INDEPENDENT_AMBULATORY_CARE_PROVIDER_SITE_OTHER): Payer: BLUE CROSS/BLUE SHIELD | Admitting: *Deleted

## 2015-01-20 DIAGNOSIS — I639 Cerebral infarction, unspecified: Secondary | ICD-10-CM

## 2015-01-20 DIAGNOSIS — I6322 Cerebral infarction due to unspecified occlusion or stenosis of basilar arteries: Secondary | ICD-10-CM

## 2015-01-24 LAB — CUP PACEART REMOTE DEVICE CHECK: Date Time Interrogation Session: 20160628143018

## 2015-01-25 NOTE — Progress Notes (Signed)
Loop recorder 

## 2015-01-26 ENCOUNTER — Other Ambulatory Visit: Payer: Self-pay | Admitting: Internal Medicine

## 2015-01-27 ENCOUNTER — Other Ambulatory Visit: Payer: Self-pay

## 2015-01-27 MED ORDER — ROSUVASTATIN CALCIUM 20 MG PO TABS: 20.0000 mg | ORAL_TABLET | Freq: Every day | ORAL | Status: DC

## 2015-02-08 ENCOUNTER — Encounter: Payer: Self-pay | Admitting: Internal Medicine

## 2015-02-08 ENCOUNTER — Ambulatory Visit (INDEPENDENT_AMBULATORY_CARE_PROVIDER_SITE_OTHER): Payer: BLUE CROSS/BLUE SHIELD | Admitting: *Deleted

## 2015-02-08 DIAGNOSIS — I4901 Ventricular fibrillation: Secondary | ICD-10-CM | POA: Diagnosis not present

## 2015-02-08 NOTE — Progress Notes (Signed)
Remote ICD transmission.   

## 2015-02-14 LAB — CUP PACEART REMOTE DEVICE CHECK
HighPow Impedance: 72 Ohm
Lead Channel Impedance Value: 430 Ohm
Lead Channel Setting Pacing Amplitude: 2.5 V
Lead Channel Setting Pacing Pulse Width: 0.5 ms
MDC IDC MSMT LEADCHNL RV SENSING INTR AMPL: 11.7 mV
MDC IDC PG SERIAL: 819791
MDC IDC SESS DTM: 20160719133325
MDC IDC SET ZONE DETECTION INTERVAL: 3000 ms
Zone Setting Detection Interval: 2000 ms
Zone Setting Detection Interval: 350 ms

## 2015-02-20 ENCOUNTER — Ambulatory Visit (INDEPENDENT_AMBULATORY_CARE_PROVIDER_SITE_OTHER): Payer: BLUE CROSS/BLUE SHIELD | Admitting: *Deleted

## 2015-02-20 DIAGNOSIS — I639 Cerebral infarction, unspecified: Secondary | ICD-10-CM | POA: Diagnosis not present

## 2015-02-20 DIAGNOSIS — I6322 Cerebral infarction due to unspecified occlusion or stenosis of basilar arteries: Secondary | ICD-10-CM

## 2015-03-03 ENCOUNTER — Encounter: Payer: Self-pay | Admitting: Cardiology

## 2015-03-08 ENCOUNTER — Encounter: Payer: Self-pay | Admitting: Internal Medicine

## 2015-03-09 NOTE — Progress Notes (Signed)
Loop recorder 

## 2015-03-22 ENCOUNTER — Ambulatory Visit (INDEPENDENT_AMBULATORY_CARE_PROVIDER_SITE_OTHER): Payer: BLUE CROSS/BLUE SHIELD | Admitting: *Deleted

## 2015-03-22 DIAGNOSIS — I639 Cerebral infarction, unspecified: Secondary | ICD-10-CM

## 2015-03-22 DIAGNOSIS — I6322 Cerebral infarction due to unspecified occlusion or stenosis of basilar arteries: Secondary | ICD-10-CM

## 2015-03-22 LAB — CUP PACEART REMOTE DEVICE CHECK
MDC IDC SESS DTM: 20160824103841
Pulse Gen Serial Number: 819791

## 2015-03-23 NOTE — Progress Notes (Signed)
Loop recorder 

## 2015-03-27 LAB — CUP PACEART REMOTE DEVICE CHECK
Date Time Interrogation Session: 20160829103237
Pulse Gen Serial Number: 819791

## 2015-03-28 ENCOUNTER — Encounter: Payer: Self-pay | Admitting: *Deleted

## 2015-03-28 ENCOUNTER — Ambulatory Visit (INDEPENDENT_AMBULATORY_CARE_PROVIDER_SITE_OTHER): Payer: BLUE CROSS/BLUE SHIELD | Admitting: *Deleted

## 2015-03-28 DIAGNOSIS — Z006 Encounter for examination for normal comparison and control in clinical research program: Secondary | ICD-10-CM

## 2015-03-28 LAB — CUP PACEART INCLINIC DEVICE CHECK
Battery Remaining Longevity: 70.8 mo
Brady Statistic RV Percent Paced: 0 %
Date Time Interrogation Session: 20160830131330
HighPow Impedance: 66.375
Lead Channel Pacing Threshold Pulse Width: 0.5 ms
Lead Channel Setting Pacing Amplitude: 2.5 V
MDC IDC MSMT LEADCHNL RV IMPEDANCE VALUE: 425 Ohm
MDC IDC MSMT LEADCHNL RV PACING THRESHOLD AMPLITUDE: 0.75 V
MDC IDC MSMT LEADCHNL RV SENSING INTR AMPL: 11.7 mV
MDC IDC PG SERIAL: 819791
MDC IDC SET LEADCHNL RV PACING PULSEWIDTH: 0.5 ms
MDC IDC SET LEADCHNL RV SENSING SENSITIVITY: 0.5 mV
Zone Setting Detection Interval: 310 ms
Zone Setting Detection Interval: 350 ms

## 2015-03-28 NOTE — Progress Notes (Signed)
Analyze ST Final Research Visit- Pt seen in device clinic. Device checked by industry and research. No ST alerts. Research ST thresholds turned off per protocol. Pt released from research study [place] and transferred to [facility] for ongoing remote monitoring. EKG WNL reviewed by Dr. Lovena Le. BP 153/80 HR 65.

## 2015-03-30 ENCOUNTER — Encounter: Payer: Self-pay | Admitting: Internal Medicine

## 2015-04-05 ENCOUNTER — Encounter: Payer: Self-pay | Admitting: Internal Medicine

## 2015-04-11 ENCOUNTER — Encounter: Payer: Self-pay | Admitting: Internal Medicine

## 2015-04-21 ENCOUNTER — Ambulatory Visit (INDEPENDENT_AMBULATORY_CARE_PROVIDER_SITE_OTHER): Payer: BLUE CROSS/BLUE SHIELD | Admitting: *Deleted

## 2015-04-21 DIAGNOSIS — I639 Cerebral infarction, unspecified: Secondary | ICD-10-CM

## 2015-04-21 DIAGNOSIS — I6322 Cerebral infarction due to unspecified occlusion or stenosis of basilar arteries: Secondary | ICD-10-CM

## 2015-04-27 NOTE — Progress Notes (Signed)
Loop recorder 

## 2015-05-04 LAB — CUP PACEART REMOTE DEVICE CHECK: Date Time Interrogation Session: 20160923160526

## 2015-05-04 NOTE — Progress Notes (Signed)
[facility] summary report received. Battery status OK. Normal device function. No new symptom episodes, tachy episodes, brady, or pause episodes. No new AF episodes. Monthly summary reports and ROV w/ GT 1/17 for ICD check.

## 2015-05-17 ENCOUNTER — Encounter: Payer: Self-pay | Admitting: Internal Medicine

## 2015-05-22 ENCOUNTER — Ambulatory Visit (INDEPENDENT_AMBULATORY_CARE_PROVIDER_SITE_OTHER): Payer: BLUE CROSS/BLUE SHIELD | Admitting: *Deleted

## 2015-05-22 DIAGNOSIS — I6322 Cerebral infarction due to unspecified occlusion or stenosis of basilar arteries: Secondary | ICD-10-CM | POA: Diagnosis not present

## 2015-05-23 NOTE — Progress Notes (Signed)
Loop recorder 

## 2015-06-20 ENCOUNTER — Ambulatory Visit (INDEPENDENT_AMBULATORY_CARE_PROVIDER_SITE_OTHER): Payer: BLUE CROSS/BLUE SHIELD | Admitting: *Deleted

## 2015-06-20 DIAGNOSIS — G458 Other transient cerebral ischemic attacks and related syndromes: Secondary | ICD-10-CM

## 2015-06-20 NOTE — Progress Notes (Signed)
[facility] Summary Report / Loop Recorder 

## 2015-06-23 LAB — CUP PACEART REMOTE DEVICE CHECK
MDC IDC LEAD IMPLANT DT: 20120214
MDC IDC LEAD LOCATION: 753860
MDC IDC LEAD MODEL: 7122
MDC IDC SESS DTM: 20161023163528

## 2015-06-23 NOTE — Progress Notes (Signed)
[facility] summary report received. Battery status OK. Normal device function. No new symptom episodes, tachy episodes, brady, or pause episodes. No new AF episodes. Monthly summary reports and ROV with GT in 07/2015.

## 2015-06-27 ENCOUNTER — Ambulatory Visit (INDEPENDENT_AMBULATORY_CARE_PROVIDER_SITE_OTHER): Payer: BLUE CROSS/BLUE SHIELD | Admitting: *Deleted

## 2015-06-27 DIAGNOSIS — I4901 Ventricular fibrillation: Secondary | ICD-10-CM | POA: Diagnosis not present

## 2015-06-29 NOTE — Progress Notes (Signed)
Remote ICD transmission.   

## 2015-07-06 LAB — CUP PACEART REMOTE DEVICE CHECK
Implantable Lead Model: 7122
MDC IDC LEAD IMPLANT DT: 20120214
MDC IDC LEAD LOCATION: 753860
MDC IDC SESS DTM: 20161208163943
Pulse Gen Serial Number: 819791

## 2015-07-07 ENCOUNTER — Encounter: Payer: Self-pay | Admitting: Cardiology

## 2015-07-20 ENCOUNTER — Ambulatory Visit (INDEPENDENT_AMBULATORY_CARE_PROVIDER_SITE_OTHER): Payer: BLUE CROSS/BLUE SHIELD | Admitting: *Deleted

## 2015-07-20 DIAGNOSIS — I6322 Cerebral infarction due to unspecified occlusion or stenosis of basilar arteries: Secondary | ICD-10-CM

## 2015-07-20 NOTE — Progress Notes (Signed)
[facility] Summary Report / Loop Recorder 

## 2015-07-27 LAB — CUP PACEART REMOTE DEVICE CHECK
Date Time Interrogation Session: 20161122163847
Implantable Lead Implant Date: 20120214
Implantable Lead Location: 753860
Implantable Lead Model: 7122

## 2015-08-19 ENCOUNTER — Other Ambulatory Visit: Payer: Self-pay | Admitting: Internal Medicine

## 2015-08-21 ENCOUNTER — Ambulatory Visit (INDEPENDENT_AMBULATORY_CARE_PROVIDER_SITE_OTHER): Payer: BLUE CROSS/BLUE SHIELD | Admitting: *Deleted

## 2015-08-21 DIAGNOSIS — I6322 Cerebral infarction due to unspecified occlusion or stenosis of basilar arteries: Secondary | ICD-10-CM | POA: Diagnosis not present

## 2015-08-22 NOTE — Progress Notes (Signed)
[facility] Summary Report / Loop Recorder 

## 2015-08-24 ENCOUNTER — Encounter: Payer: BLUE CROSS/BLUE SHIELD | Admitting: Internal Medicine

## 2015-09-02 LAB — CUP PACEART REMOTE DEVICE CHECK
Date Time Interrogation Session: 20161222170858
Implantable Lead Implant Date: 20120214
Implantable Lead Location: 753860
MDC IDC LEAD MODEL: 7122

## 2015-09-16 ENCOUNTER — Other Ambulatory Visit: Payer: Self-pay | Admitting: Internal Medicine

## 2015-09-18 ENCOUNTER — Other Ambulatory Visit: Payer: Self-pay | Admitting: Internal Medicine

## 2015-09-18 ENCOUNTER — Ambulatory Visit (INDEPENDENT_AMBULATORY_CARE_PROVIDER_SITE_OTHER): Payer: BLUE CROSS/BLUE SHIELD | Admitting: *Deleted

## 2015-09-18 DIAGNOSIS — I6322 Cerebral infarction due to unspecified occlusion or stenosis of basilar arteries: Secondary | ICD-10-CM | POA: Diagnosis not present

## 2015-09-18 NOTE — Progress Notes (Signed)
[facility] Summary Report / Loop Recorder 

## 2015-10-02 LAB — CUP PACEART REMOTE DEVICE CHECK
Implantable Lead Implant Date: 20120214
Implantable Lead Location: 753860
Implantable Lead Model: 7122
MDC IDC SESS DTM: 20170121170740

## 2015-10-02 NOTE — Progress Notes (Signed)
[facility]link summary report received. Battery status OK. Normal device function. No new symptom episodes, tachy episodes, brady, or pause episodes. No new AF episodes. Monthly summary reports and ROV/PRN 

## 2015-10-05 LAB — CUP PACEART REMOTE DEVICE CHECK
Implantable Lead Implant Date: 20120214
MDC IDC LEAD LOCATION: 753860
MDC IDC LEAD MODEL: 7122
MDC IDC SESS DTM: 20170220173752

## 2015-10-05 NOTE — Progress Notes (Signed)
[facility]link summary report received. Battery status OK. Normal device function. No new symptom episodes, tachy episodes, brady, or pause episodes. No new AF episodes. Monthly summary reports and ROV/PRN 

## 2015-10-18 ENCOUNTER — Ambulatory Visit (INDEPENDENT_AMBULATORY_CARE_PROVIDER_SITE_OTHER): Payer: BLUE CROSS/BLUE SHIELD | Admitting: *Deleted

## 2015-10-18 DIAGNOSIS — I6322 Cerebral infarction due to unspecified occlusion or stenosis of basilar arteries: Secondary | ICD-10-CM

## 2015-10-18 NOTE — Progress Notes (Signed)
[facility] Summary Report / Loop Recorder 

## 2015-10-21 ENCOUNTER — Other Ambulatory Visit: Payer: Self-pay | Admitting: Internal Medicine

## 2015-11-14 ENCOUNTER — Ambulatory Visit (INDEPENDENT_AMBULATORY_CARE_PROVIDER_SITE_OTHER): Payer: BLUE CROSS/BLUE SHIELD | Admitting: Internal Medicine

## 2015-11-14 ENCOUNTER — Encounter: Payer: Self-pay | Admitting: Internal Medicine

## 2015-11-14 VITALS — BP 140/74 | HR 72 | Ht 67.5 in | Wt 213.2 lb

## 2015-11-14 DIAGNOSIS — R079 Chest pain, unspecified: Secondary | ICD-10-CM

## 2015-11-14 DIAGNOSIS — R9431 Abnormal electrocardiogram [ECG] [EKG]: Secondary | ICD-10-CM | POA: Diagnosis not present

## 2015-11-14 DIAGNOSIS — I4901 Ventricular fibrillation: Secondary | ICD-10-CM | POA: Diagnosis not present

## 2015-11-14 NOTE — Patient Instructions (Addendum)
Medication Instructions:  Your physician recommends that you continue on your current medications as directed. Please refer to the Current Medication list given to you today.   Labwork: None ordered   Testing/Procedures: Your physician has requested that you have en exercise stress myoview. For further information please visit [email or web address]. Please follow instruction sheet, as given.---will call with results of test      Follow-Up:  Your physician wants you to follow-up in: 12 months with Dr Knox Saliva will receive a reminder letter in the mail two months in advance. If you don't receive a letter, please call our office to schedule the follow-up appointment.  Remote monitoring is used to monitor your Pacemaker of ICD from home. This monitoring reduces the number of office visits required to check your device to one time per year. It allows [place] to keep an eye on the functioning of your device to ensure it is working properly. You are scheduled for a device check from home on 02/13/16. You may send your transmission at any time that day. If you have a wireless device, the transmission will be sent automatically. After your physician reviews your transmission, you will receive a postcard with your next transmission date.     Any Other Special Instructions Will Be Listed Below (If Applicable).     If you need a refill on your cardiac medications before your next appointment, please call your pharmacy.

## 2015-11-14 NOTE — Progress Notes (Signed)
HPI [name] returns today for followup. He is a very pleasant 60 year old man who sustained a ventricular fibrillation cardiac arrest now over 4 years ago. He has returned to work and continues to do quite well. The etiology of his arrest is still unclear. He has coronary disease with preserved left ventricular function.  He is trying to do better with his diet. He does not have palpitations. Over the past few weeks, he has had chest pressure with exertion which gets better with rest. No Known Allergies   Current Outpatient Prescriptions  Medication Sig Dispense Refill  . aspirin 81 MG tablet Take 81 mg by mouth daily.      . ergocalciferol (VITAMIN D2) 50000 UNITS capsule Take 50,000 Units by mouth every 3 (three) months.     . insulin glargine (LANTUS SOLOSTAR) 100 UNIT/ML injection Inject 65 Units into the skin daily.     . Liraglutide (VICTOZA [place]) Inject 1.8 Units into the skin daily.     . Liraglutide (VICTOZA) 18 MG/3ML SOPN Inject 1.8 mg into the skin daily.    . metFORMIN (GLUCOPHAGE) 500 MG tablet Take 1,000 mg by mouth 2 (two) times daily with a meal.     . metoprolol (LOPRESSOR) 50 MG tablet TAKE ONE TABLET BY MOUTH TWICE DAILY; needs appoitment dr. Lovena [name] 60 tablet 0  . nitroGLYCERIN (NITROSTAT) 0.4 MG SL tablet Place 0.4 mg under the tongue every 5 (five) minutes as needed for chest pain.    . ramipril (ALTACE) 2.5 MG capsule Take 2.5 mg by mouth daily.      . rosuvastatin (CRESTOR) 20 MG tablet TAKE ONE TABLET BY MOUTH DAILY  30 tablet 0   No current facility-administered medications for this visit.     Past Medical History  Diagnosis Date  . Coronary artery disease   . VF (ventricular fibrillation) ([place])     arrest  . Diabetes mellitus     type 2  . Hypertension   . Dyslipidemia   . Obesity   . Asthma   . CHF (congestive heart failure) ([place])   . Myocardial infarction ([place]) 2012  . Tubular adenoma of colon 01/12/2014    transverse colon  . Diverticular disease  01/12/14    mild    ROS:   All systems reviewed and negative except as noted in the HPI.   Past Surgical History  Procedure Laterality Date  . Sternectomy    . Cardiac catheterization  09/16/10  . Coronary artery bypass graft  2006  . Implantable cardioverter defibrillator implant  2012    STJ ICD- single chamber  . Mass excision  2008    right shoulder  . Colonoscopy  01/12/2014  . Abdominal aortagram N/A 12/14/2013    Procedure: ABDOMINAL Eugene [name];  Surgeon: Eugene Mitchell, MD;  Location: [facility] CATH LAB;  Service: Cardiovascular;  Laterality: N/A;  . Loop recorder implant N/A 08/24/2014    Procedure: LOOP RECORDER IMPLANT;  Surgeon: Eugene Lance, MD;  Location: [facility] CATH LAB;  Service: Cardiovascular;  Laterality: N/A;     Family History  Problem Relation Age of Onset  . Coronary artery disease Father     early 84's  . Diabetes Father   . Hypertension Father   . Breast cancer Mother 61    mets to colon and other  . Diabetes Sister   . Prostate cancer Brother   . Heart disease Brother   . Glaucoma Mother      Social History   Social  History  . Marital Status: Single    Spouse Name: N/A  . Number of Children: 0  . Years of Education: N/A   Occupational History  . [employer]   Social History Main Topics  . Smoking status: Never Smoker   . Smokeless tobacco: Never Used  . Alcohol Use: No  . Drug Use: No  . Sexual Activity: Not on file   Other Topics Concern  . 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 Well-healed ICD incision. HEART:  Regular rate rhythm, no murmurs,  no rubs, no clicks Abd:  soft, positive bowel sounds, no organomegally, no rebound, no guarding Ext:  2 plus pulses, no edema, no cyanosis, no clubbing Skin:  No rashes no nodules Neuro:  CN II through XII intact, motor grossly intact  EKG Normal sinus rhythm with incomplete right bundle branch block.  DEVICE  Normal device function (ILR and St. Jude ICD).  See PaceArt for details.   Assess/Plan:  1. Chest pressure - he has known CAD, and his symptoms are worrisome. Will plan to obtain an exercise myoview scan. If abnormal, will recommend left heart cath. 2. VF - he has had no recurrent ventricular arrhythmias. No change in his meds. 3. 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 No atrial fib so far 4. ICD - his St. Jude device is working normally. Will follow.   Eugene [name].D.

## 2015-11-17 ENCOUNTER — Ambulatory Visit (INDEPENDENT_AMBULATORY_CARE_PROVIDER_SITE_OTHER): Payer: BLUE CROSS/BLUE SHIELD | Admitting: *Deleted

## 2015-11-17 DIAGNOSIS — I6322 Cerebral infarction due to unspecified occlusion or stenosis of basilar arteries: Secondary | ICD-10-CM

## 2015-11-17 NOTE — Progress Notes (Signed)
[facility] Summary Report / Loop Recorder 

## 2015-11-20 ENCOUNTER — Telehealth: Payer: Self-pay | Admitting: Cardiology

## 2015-11-20 NOTE — Telephone Encounter (Signed)
Spoke w/ pt and requested that he send a manual transmission b/c his home monitor has not updated in at least 8 days.   

## 2015-11-22 LAB — CUP PACEART INCLINIC DEVICE CHECK
Date Time Interrogation Session: 20170418125914
HighPow Impedance: 68.625
Lead Channel Impedance Value: 425 Ohm
Lead Channel Pacing Threshold Amplitude: 0.75 V
Lead Channel Sensing Intrinsic Amplitude: 11.7 mV
MDC IDC LEAD IMPLANT DT: 20120214
MDC IDC LEAD LOCATION: 753860
MDC IDC LEAD MODEL: 7122
MDC IDC MSMT BATTERY REMAINING LONGEVITY: 66
MDC IDC MSMT LEADCHNL RV PACING THRESHOLD PULSEWIDTH: 0.5 ms
MDC IDC PG SERIAL: 819791
MDC IDC SET LEADCHNL RV PACING AMPLITUDE: 2.5 V
MDC IDC SET LEADCHNL RV PACING PULSEWIDTH: 0.5 ms
MDC IDC SET LEADCHNL RV SENSING SENSITIVITY: 0.5 mV
MDC IDC STAT BRADY RV PERCENT PACED: 0 %

## 2015-11-23 ENCOUNTER — Other Ambulatory Visit: Payer: Self-pay | Admitting: Internal Medicine

## 2015-12-14 ENCOUNTER — Telehealth (HOSPITAL_COMMUNITY): Payer: Self-pay | Admitting: *Deleted

## 2015-12-14 NOTE — Telephone Encounter (Signed)
Patient given detailed instructions per Myocardial Perfusion Study Information Sheet for the test on 12/19/15 at 0945. Patient notified to arrive 15 minutes early and that it is imperative to arrive on time for appointment to keep from having the test rescheduled.  If you need to cancel or reschedule your appointment, please call the office within 24 hours of your appointment. Failure to do so may result in a cancellation of your appointment, and a $50 no show fee. Patient verbalized understanding.Alaine Loughney, Ranae Palms

## 2015-12-18 ENCOUNTER — Encounter (HOSPITAL_COMMUNITY): Payer: BLUE CROSS/BLUE SHIELD

## 2015-12-18 ENCOUNTER — Ambulatory Visit (INDEPENDENT_AMBULATORY_CARE_PROVIDER_SITE_OTHER): Payer: BLUE CROSS/BLUE SHIELD | Admitting: *Deleted

## 2015-12-18 DIAGNOSIS — I6322 Cerebral infarction due to unspecified occlusion or stenosis of basilar arteries: Secondary | ICD-10-CM | POA: Diagnosis not present

## 2015-12-18 NOTE — Progress Notes (Signed)
[facility] Summary Report / Loop Recorder 

## 2015-12-19 ENCOUNTER — Ambulatory Visit (HOSPITAL_COMMUNITY): Payer: BLUE CROSS/BLUE SHIELD | Attending: Cardiology

## 2015-12-19 VITALS — Ht 67.0 in | Wt 213.0 lb

## 2015-12-19 DIAGNOSIS — I10 Essential (primary) hypertension: Secondary | ICD-10-CM | POA: Insufficient documentation

## 2015-12-19 DIAGNOSIS — R9439 Abnormal result of other cardiovascular function study: Secondary | ICD-10-CM | POA: Diagnosis not present

## 2015-12-19 DIAGNOSIS — R9431 Abnormal electrocardiogram [ECG] [EKG]: Secondary | ICD-10-CM | POA: Insufficient documentation

## 2015-12-19 DIAGNOSIS — Z8249 Family history of ischemic heart disease and other diseases of the circulatory system: Secondary | ICD-10-CM | POA: Insufficient documentation

## 2015-12-19 DIAGNOSIS — R079 Chest pain, unspecified: Secondary | ICD-10-CM | POA: Diagnosis not present

## 2015-12-19 DIAGNOSIS — R0609 Other forms of dyspnea: Secondary | ICD-10-CM | POA: Diagnosis not present

## 2015-12-19 DIAGNOSIS — E119 Type 2 diabetes mellitus without complications: Secondary | ICD-10-CM | POA: Insufficient documentation

## 2015-12-19 DIAGNOSIS — Z8673 Personal history of transient ischemic attack (TIA), and cerebral infarction without residual deficits: Secondary | ICD-10-CM | POA: Diagnosis not present

## 2015-12-19 LAB — MYOCARDIAL PERFUSION IMAGING
CHL CUP NUCLEAR SDS: 1
CHL CUP NUCLEAR SRS: 8
CHL CUP NUCLEAR SSS: 9
CHL CUP RESTING HR STRESS: 81 {beats}/min
LHR: 0.38
LV sys vol: 43 mL
LVDIAVOL: 81 mL (ref 62–150)
NUC STRESS TID: 1.06
Peak HR: 104 {beats}/min

## 2015-12-19 MED ORDER — TECHNETIUM TC 99M TETROFOSMIN IV KIT
32.7000 | PACK | Freq: Once | INTRAVENOUS | Status: AC | PRN
  Administered 2015-12-19: 32.7 via INTRAVENOUS
  Filled 2015-12-19: qty 33

## 2015-12-19 MED ORDER — REGADENOSON 0.4 MG/5ML IV SOLN
0.4000 mg | Freq: Once | INTRAVENOUS | Status: AC
  Administered 2015-12-19: 0.4 mg via INTRAVENOUS

## 2015-12-19 MED ORDER — TECHNETIUM TC 99M TETROFOSMIN IV KIT
10.2000 | PACK | Freq: Once | INTRAVENOUS | Status: AC | PRN
  Administered 2015-12-19: 10 via INTRAVENOUS
  Filled 2015-12-19: qty 10

## 2015-12-23 LAB — CUP PACEART REMOTE DEVICE CHECK
Date Time Interrogation Session: 20170322180807
Implantable Lead Implant Date: 20120214
MDC IDC LEAD LOCATION: 753860
MDC IDC LEAD MODEL: 7122

## 2015-12-23 NOTE — Progress Notes (Signed)
[facility]link summary report received. Battery status OK. Normal device function. No new symptom episodes, tachy episodes, brady, or pause episodes. No new AF episodes. Monthly summary reports and ROV/PRN 

## 2015-12-25 LAB — CUP PACEART REMOTE DEVICE CHECK
Implantable Lead Implant Date: 20120214
Implantable Lead Location: 753860
MDC IDC LEAD MODEL: 7122
MDC IDC SESS DTM: 20170421183616

## 2015-12-25 NOTE — Progress Notes (Signed)
Patient ID: Eugene [name], male   DOB: Sep 06, 1955, 60 y.o.   MRN: [id number] Carelink summary report received. Battery status OK. Normal device function. No new symptom episodes, tachy episodes, brady, or pause episodes. No new AF episodes. Monthly summary reports and ROV/PRN

## 2016-01-16 ENCOUNTER — Ambulatory Visit (INDEPENDENT_AMBULATORY_CARE_PROVIDER_SITE_OTHER): Payer: BLUE CROSS/BLUE SHIELD | Admitting: *Deleted

## 2016-01-16 DIAGNOSIS — I6322 Cerebral infarction due to unspecified occlusion or stenosis of basilar arteries: Secondary | ICD-10-CM

## 2016-01-17 NOTE — Progress Notes (Signed)
[facility] Summary Report / Loop Recorder 

## 2016-01-27 LAB — CUP PACEART REMOTE DEVICE CHECK
Date Time Interrogation Session: 20170521183702
Date Time Interrogation Session: 20170620183841
Implantable Lead Implant Date: 20120214
Implantable Lead Implant Date: 20120214
Implantable Lead Location: 753860
Implantable Lead Location: 753860
Implantable Lead Model: 7122
Implantable Lead Model: 7122

## 2016-02-13 ENCOUNTER — Ambulatory Visit (INDEPENDENT_AMBULATORY_CARE_PROVIDER_SITE_OTHER): Payer: BLUE CROSS/BLUE SHIELD | Admitting: *Deleted

## 2016-02-13 DIAGNOSIS — I4901 Ventricular fibrillation: Secondary | ICD-10-CM | POA: Diagnosis not present

## 2016-02-13 NOTE — Progress Notes (Signed)
Remote ICD transmission.   

## 2016-02-15 ENCOUNTER — Ambulatory Visit (INDEPENDENT_AMBULATORY_CARE_PROVIDER_SITE_OTHER): Payer: BLUE CROSS/BLUE SHIELD | Admitting: *Deleted

## 2016-02-15 ENCOUNTER — Encounter: Payer: Self-pay | Admitting: Cardiology

## 2016-02-15 DIAGNOSIS — I6322 Cerebral infarction due to unspecified occlusion or stenosis of basilar arteries: Secondary | ICD-10-CM

## 2016-02-16 LAB — CUP PACEART REMOTE DEVICE CHECK
Date Time Interrogation Session: 20170721110507
HIGH POWER IMPEDANCE MEASURED VALUE: 78 Ohm
Implantable Lead Implant Date: 20120214
Implantable Lead Model: 7122
Lead Channel Sensing Intrinsic Amplitude: 11.7 mV
MDC IDC LEAD LOCATION: 753860
MDC IDC MSMT LEADCHNL RV IMPEDANCE VALUE: 480 Ohm
MDC IDC STAT BRADY RV PERCENT PACED: 1 % — AB
Pulse Gen Serial Number: 819791

## 2016-02-16 NOTE — Progress Notes (Signed)
[facility] Summary Report / Loop Recorder 

## 2016-02-26 ENCOUNTER — Telehealth: Payer: Self-pay | Admitting: Cardiology

## 2016-02-26 NOTE — Telephone Encounter (Signed)
Spoke w/ pt and requested that he send a manual transmission b/c his home monitor has not updated in at least 8 days.   

## 2016-03-07 LAB — CUP PACEART REMOTE DEVICE CHECK
Implantable Lead Location: 753860
Implantable Lead Model: 7122
MDC IDC LEAD IMPLANT DT: 20120214
MDC IDC SESS DTM: 20170720193615

## 2016-03-18 ENCOUNTER — Ambulatory Visit (INDEPENDENT_AMBULATORY_CARE_PROVIDER_SITE_OTHER): Payer: BLUE CROSS/BLUE SHIELD | Admitting: *Deleted

## 2016-03-18 DIAGNOSIS — I6322 Cerebral infarction due to unspecified occlusion or stenosis of basilar arteries: Secondary | ICD-10-CM | POA: Diagnosis not present

## 2016-03-18 NOTE — Progress Notes (Signed)
Carelink Summary Report / Loop Report 

## 2016-04-15 ENCOUNTER — Ambulatory Visit (INDEPENDENT_AMBULATORY_CARE_PROVIDER_SITE_OTHER): Payer: BLUE CROSS/BLUE SHIELD | Admitting: *Deleted

## 2016-04-15 DIAGNOSIS — I6322 Cerebral infarction due to unspecified occlusion or stenosis of basilar arteries: Secondary | ICD-10-CM | POA: Diagnosis not present

## 2016-04-15 LAB — CUP PACEART REMOTE DEVICE CHECK
Implantable Lead Model: 7122
MDC IDC LEAD IMPLANT DT: 20120214
MDC IDC LEAD LOCATION: 753860
MDC IDC SESS DTM: 20170819201217

## 2016-04-15 NOTE — Progress Notes (Signed)
[facility]link summary report received. Battery status OK. Normal device function. No new symptom episodes, tachy episodes, brady, or pause episodes. No new AF episodes. Monthly summary reports and ROV/PRN 

## 2016-04-16 NOTE — Progress Notes (Signed)
[facility] Summary Report / Loop Recorder 

## 2016-05-10 LAB — CUP PACEART REMOTE DEVICE CHECK
Implantable Lead Implant Date: 20120214
Implantable Lead Location: 753860
MDC IDC LEAD MODEL: 7122
MDC IDC SESS DTM: 20170918203605

## 2016-05-10 NOTE — Progress Notes (Signed)
[facility]link summary report received. Battery status OK. Normal device function. No new symptom episodes, tachy episodes, brady, or pause episodes. No new AF episodes. Monthly summary reports and ROV/PRN 

## 2016-05-14 DIAGNOSIS — K219 Gastro-esophageal reflux disease without esophagitis: Secondary | ICD-10-CM | POA: Insufficient documentation

## 2016-05-15 ENCOUNTER — Ambulatory Visit (INDEPENDENT_AMBULATORY_CARE_PROVIDER_SITE_OTHER): Payer: BLUE CROSS/BLUE SHIELD | Admitting: *Deleted

## 2016-05-15 DIAGNOSIS — I6322 Cerebral infarction due to unspecified occlusion or stenosis of basilar arteries: Secondary | ICD-10-CM

## 2016-05-15 DIAGNOSIS — I4901 Ventricular fibrillation: Secondary | ICD-10-CM | POA: Diagnosis not present

## 2016-05-15 NOTE — Progress Notes (Signed)
Remote ICD transmission.   

## 2016-05-16 ENCOUNTER — Encounter: Payer: Self-pay | Admitting: Cardiology

## 2016-05-17 NOTE — Progress Notes (Signed)
[facility] Summary Report / Loop Recorder 

## 2016-06-13 LAB — CUP PACEART REMOTE DEVICE CHECK
Brady Statistic RV Percent Paced: 1 % — CL
Date Time Interrogation Session: 20171116094058
HighPow Impedance: 70 Ohm
Implantable Lead Model: 7122
Implantable Pulse Generator Implant Date: 20120214
Lead Channel Impedance Value: 450 Ohm
Lead Channel Sensing Intrinsic Amplitude: 11.7 mV
Lead Channel Setting Pacing Amplitude: 2.5 V
Lead Channel Setting Pacing Pulse Width: 0.5 ms
MDC IDC LEAD IMPLANT DT: 20120214
MDC IDC LEAD LOCATION: 753860
MDC IDC MSMT BATTERY REMAINING LONGEVITY: 61 mo
MDC IDC MSMT BATTERY REMAINING PERCENTAGE: 53 %
MDC IDC SET LEADCHNL RV SENSING SENSITIVITY: 0.5 mV
Pulse Gen Serial Number: 819791

## 2016-06-14 ENCOUNTER — Ambulatory Visit (INDEPENDENT_AMBULATORY_CARE_PROVIDER_SITE_OTHER): Payer: BLUE CROSS/BLUE SHIELD | Admitting: *Deleted

## 2016-06-14 DIAGNOSIS — I6322 Cerebral infarction due to unspecified occlusion or stenosis of basilar arteries: Secondary | ICD-10-CM

## 2016-06-15 LAB — CUP PACEART REMOTE DEVICE CHECK
Date Time Interrogation Session: 20171018211212
Implantable Lead Implant Date: 20120214
Implantable Lead Location: 753860
Implantable Lead Model: 7122
Implantable Pulse Generator Implant Date: 20160127

## 2016-06-15 NOTE — Progress Notes (Signed)
[facility]link summary report received. Battery status OK. Normal device function. No new symptom episodes, tachy episodes, brady, or pause episodes. No new AF episodes. Monthly summary reports and ROV/PRN 

## 2016-06-17 NOTE — Progress Notes (Signed)
[facility] Summary Report / Loop Recorder 

## 2016-07-04 ENCOUNTER — Telehealth: Payer: Self-pay | Admitting: Cardiology

## 2016-07-04 NOTE — Telephone Encounter (Signed)
Spoke w/ pt and requested that he send a manual transmission b/c his home monitor has not updated in at least 7 days.   

## 2016-07-15 ENCOUNTER — Ambulatory Visit (INDEPENDENT_AMBULATORY_CARE_PROVIDER_SITE_OTHER): Payer: BLUE CROSS/BLUE SHIELD | Admitting: *Deleted

## 2016-07-15 DIAGNOSIS — I6322 Cerebral infarction due to unspecified occlusion or stenosis of basilar arteries: Secondary | ICD-10-CM | POA: Diagnosis not present

## 2016-07-15 NOTE — Progress Notes (Signed)
[facility] Summary Report / Loop Recorder 

## 2016-07-25 LAB — CUP PACEART REMOTE DEVICE CHECK
Implantable Lead Implant Date: 20120214
Implantable Lead Model: 7122
MDC IDC LEAD LOCATION: 753860
MDC IDC PG IMPLANT DT: 20160127
MDC IDC SESS DTM: 20171117220805

## 2016-08-13 ENCOUNTER — Ambulatory Visit (INDEPENDENT_AMBULATORY_CARE_PROVIDER_SITE_OTHER): Payer: BLUE CROSS/BLUE SHIELD | Admitting: *Deleted

## 2016-08-13 DIAGNOSIS — I6322 Cerebral infarction due to unspecified occlusion or stenosis of basilar arteries: Secondary | ICD-10-CM | POA: Diagnosis not present

## 2016-08-14 ENCOUNTER — Ambulatory Visit (INDEPENDENT_AMBULATORY_CARE_PROVIDER_SITE_OTHER): Payer: BLUE CROSS/BLUE SHIELD | Admitting: *Deleted

## 2016-08-14 DIAGNOSIS — I4901 Ventricular fibrillation: Secondary | ICD-10-CM | POA: Diagnosis not present

## 2016-08-16 NOTE — Progress Notes (Signed)
[facility] Summary Report / Loop Recorder 

## 2016-08-16 NOTE — Progress Notes (Signed)
Remote ICD transmission.   

## 2016-08-21 ENCOUNTER — Encounter: Payer: Self-pay | Admitting: Cardiology

## 2016-08-22 LAB — CUP PACEART REMOTE DEVICE CHECK
Battery Remaining Longevity: 57 mo
Battery Remaining Percentage: 51 %
Date Time Interrogation Session: 20180116233920
HIGH POWER IMPEDANCE MEASURED VALUE: 65 Ohm
Implantable Lead Implant Date: 20120214
Implantable Lead Location: 753860
Implantable Lead Model: 7122
MDC IDC MSMT LEADCHNL RV IMPEDANCE VALUE: 440 Ohm
MDC IDC MSMT LEADCHNL RV SENSING INTR AMPL: 11.7 mV
MDC IDC PG IMPLANT DT: 20120214
MDC IDC PG SERIAL: 819791
MDC IDC STAT BRADY RV PERCENT PACED: 1 % — AB

## 2016-09-02 LAB — CUP PACEART REMOTE DEVICE CHECK
Implantable Lead Implant Date: 20120214
Implantable Lead Model: 7122
MDC IDC LEAD LOCATION: 753860
MDC IDC PG IMPLANT DT: 20160127
MDC IDC SESS DTM: 20171217231109

## 2016-09-04 ENCOUNTER — Telehealth: Payer: Self-pay | Admitting: Cardiology

## 2016-09-04 NOTE — Telephone Encounter (Signed)
LMOVM requesting that pt send manual transmission b/c home monitor has not updated in at least 7 days.    

## 2016-09-11 ENCOUNTER — Telehealth: Payer: Self-pay | Admitting: Cardiology

## 2016-09-11 NOTE — Telephone Encounter (Signed)
Attempted to call pt b/c his home monitor has not updated in the last 7 days. Number busy.

## 2016-09-12 ENCOUNTER — Ambulatory Visit (INDEPENDENT_AMBULATORY_CARE_PROVIDER_SITE_OTHER): Payer: BLUE CROSS/BLUE SHIELD | Admitting: *Deleted

## 2016-09-12 DIAGNOSIS — I6322 Cerebral infarction due to unspecified occlusion or stenosis of basilar arteries: Secondary | ICD-10-CM

## 2016-09-16 NOTE — Progress Notes (Signed)
[facility] Summary Report / Loop Recorder 

## 2016-10-03 LAB — CUP PACEART REMOTE DEVICE CHECK
Implantable Lead Location: 753860
MDC IDC LEAD IMPLANT DT: 20120214
MDC IDC PG IMPLANT DT: 20160127
MDC IDC SESS DTM: 20180215233812

## 2016-10-14 ENCOUNTER — Ambulatory Visit (INDEPENDENT_AMBULATORY_CARE_PROVIDER_SITE_OTHER): Payer: BLUE CROSS/BLUE SHIELD | Admitting: *Deleted

## 2016-10-14 DIAGNOSIS — I6322 Cerebral infarction due to unspecified occlusion or stenosis of basilar arteries: Secondary | ICD-10-CM

## 2016-10-14 NOTE — Progress Notes (Signed)
[facility] Summary Report / Loop Recorder 

## 2016-10-23 LAB — CUP PACEART REMOTE DEVICE CHECK
Implantable Lead Location: 753860
Implantable Pulse Generator Implant Date: 20160127
MDC IDC LEAD IMPLANT DT: 20120214
MDC IDC SESS DTM: 20180318001126

## 2016-11-11 ENCOUNTER — Ambulatory Visit (INDEPENDENT_AMBULATORY_CARE_PROVIDER_SITE_OTHER): Payer: BLUE CROSS/BLUE SHIELD | Admitting: *Deleted

## 2016-11-11 DIAGNOSIS — I6322 Cerebral infarction due to unspecified occlusion or stenosis of basilar arteries: Secondary | ICD-10-CM | POA: Diagnosis not present

## 2016-11-12 NOTE — Progress Notes (Signed)
[facility] Summary Report / Loop Recorder 

## 2016-11-20 ENCOUNTER — Other Ambulatory Visit: Payer: Self-pay | Admitting: Internal Medicine

## 2016-11-20 MED ORDER — ROSUVASTATIN CALCIUM 20 MG PO TABS: 20.0000 mg | ORAL_TABLET | Freq: Every day | ORAL | 0 refills | Status: DC

## 2016-11-24 LAB — CUP PACEART REMOTE DEVICE CHECK
Date Time Interrogation Session: 20180417003815
Implantable Lead Location: 753860
Implantable Pulse Generator Implant Date: 20160127
MDC IDC LEAD IMPLANT DT: 20120214

## 2016-11-24 NOTE — Progress Notes (Signed)
[facility]link summary report received. Battery status OK. Normal device function. No new symptom episodes, tachy episodes, brady, or pause episodes. No new AF episodes. Monthly summary reports and ROV/PRN 

## 2016-12-11 ENCOUNTER — Ambulatory Visit (INDEPENDENT_AMBULATORY_CARE_PROVIDER_SITE_OTHER): Payer: BLUE CROSS/BLUE SHIELD | Admitting: *Deleted

## 2016-12-11 DIAGNOSIS — I6322 Cerebral infarction due to unspecified occlusion or stenosis of basilar arteries: Secondary | ICD-10-CM

## 2016-12-12 NOTE — Progress Notes (Signed)
[facility] Summary Report / Loop Recorder 

## 2016-12-18 ENCOUNTER — Other Ambulatory Visit: Payer: Self-pay | Admitting: Internal Medicine

## 2016-12-22 LAB — CUP PACEART REMOTE DEVICE CHECK
Implantable Lead Implant Date: 20120214
Implantable Lead Location: 753860
Implantable Lead Model: 7122
MDC IDC PG IMPLANT DT: 20160127
MDC IDC SESS DTM: 20180517003908

## 2016-12-22 NOTE — Progress Notes (Signed)
[facility]link summary report received. Battery status OK. Normal device function. No new symptom episodes, tachy episodes, brady, or pause episodes. No new AF episodes. Monthly summary reports and ROV/PRN 

## 2016-12-27 ENCOUNTER — Other Ambulatory Visit: Payer: Self-pay | Admitting: Internal Medicine

## 2017-01-10 ENCOUNTER — Ambulatory Visit (INDEPENDENT_AMBULATORY_CARE_PROVIDER_SITE_OTHER): Payer: BLUE CROSS/BLUE SHIELD | Admitting: *Deleted

## 2017-01-10 DIAGNOSIS — I6322 Cerebral infarction due to unspecified occlusion or stenosis of basilar arteries: Secondary | ICD-10-CM | POA: Diagnosis not present

## 2017-01-13 NOTE — Progress Notes (Signed)
[facility] Summary Report / Loop Recorder 

## 2017-01-20 LAB — CUP PACEART REMOTE DEVICE CHECK
Implantable Lead Implant Date: 20120214
Implantable Lead Model: 7122
Implantable Pulse Generator Implant Date: 20160127
MDC IDC LEAD LOCATION: 753860
MDC IDC SESS DTM: 20180616003917

## 2017-01-20 NOTE — Progress Notes (Signed)
[facility]link summary report received. Battery status OK. Normal device function. No new symptom episodes, tachy episodes, brady, or pause episodes. No new AF episodes. Monthly summary reports and ROV/PRN 

## 2017-02-10 ENCOUNTER — Ambulatory Visit (INDEPENDENT_AMBULATORY_CARE_PROVIDER_SITE_OTHER): Payer: BLUE CROSS/BLUE SHIELD | Admitting: *Deleted

## 2017-02-10 ENCOUNTER — Encounter: Payer: Self-pay | Admitting: Internal Medicine

## 2017-02-10 ENCOUNTER — Ambulatory Visit (INDEPENDENT_AMBULATORY_CARE_PROVIDER_SITE_OTHER): Payer: BLUE CROSS/BLUE SHIELD | Admitting: Internal Medicine

## 2017-02-10 VITALS — BP 124/66 | HR 72 | Ht 66.5 in | Wt 207.6 lb

## 2017-02-10 DIAGNOSIS — I4901 Ventricular fibrillation: Secondary | ICD-10-CM

## 2017-02-10 DIAGNOSIS — I639 Cerebral infarction, unspecified: Secondary | ICD-10-CM

## 2017-02-10 DIAGNOSIS — G458 Other transient cerebral ischemic attacks and related syndromes: Secondary | ICD-10-CM

## 2017-02-10 LAB — CUP PACEART INCLINIC DEVICE CHECK
Battery Remaining Longevity: 53 mo
Date Time Interrogation Session: 20180716171058
HIGH POWER IMPEDANCE MEASURED VALUE: 72 Ohm
Implantable Lead Model: 7122
Implantable Pulse Generator Implant Date: 20120214
Lead Channel Pacing Threshold Amplitude: 0.75 V
Lead Channel Pacing Threshold Pulse Width: 0.5 ms
Lead Channel Setting Pacing Amplitude: 2.5 V
MDC IDC LEAD IMPLANT DT: 20120214
MDC IDC LEAD IMPLANT DT: 20120214
MDC IDC LEAD LOCATION: 753860
MDC IDC LEAD LOCATION: 753860
MDC IDC MSMT LEADCHNL RV IMPEDANCE VALUE: 450 Ohm
MDC IDC MSMT LEADCHNL RV SENSING INTR AMPL: 11.7 mV
MDC IDC PG IMPLANT DT: 20160127
MDC IDC SESS DTM: 20180716171135
MDC IDC SET LEADCHNL RV PACING PULSEWIDTH: 0.5 ms
MDC IDC SET LEADCHNL RV SENSING SENSITIVITY: 0.5 mV
Pulse Gen Serial Number: 819791

## 2017-02-10 NOTE — Progress Notes (Signed)
HPI Mr. Detty returns today for followup. He is a very pleasant 61 year old man who sustained a ventricular fibrillation cardiac arrest now over 6 years ago. He has returned to work and continues to do quite well. The etiology of his arrest is still unclear. He has coronary disease with preserved left ventricular function.  He is trying to do better with his diet. He does not have palpitations.  He has not had any ICD shocks in the interim. He had a cryptogenic stroke and underwent insertion of an ILR 2 years ago but has not yet had any atrial fib.    No Known Allergies   Current Outpatient Prescriptions  Medication Sig Dispense Refill  . aspirin 81 MG tablet Take 81 mg by mouth daily.      . clopidogrel (PLAVIX) 75 MG tablet Take 75 mg by mouth daily.    . Insulin Glargine (LANTUS SOLOSTAR) 100 UNIT/ML Solostar Pen Inject 70 Units into the skin daily.    . Liraglutide (VICTOZA) 18 MG/3ML SOPN Inject 1.8 mg into the skin daily.    Marland Kitchen LORazepam (ATIVAN PO) Take 1 tablet by mouth daily. Pt unsure of strength    . metFORMIN (GLUCOPHAGE) 500 MG tablet Take 1,000 mg by mouth 2 (two) times daily with a meal.     . metoprolol tartrate (LOPRESSOR) 50 MG tablet TAKE ONE TABLET BY MOUTH TWICE DAILY  60 tablet 1  . nitroGLYCERIN (NITROSTAT) 0.4 MG SL tablet Place 0.4 mg under the tongue every 5 (five) minutes as needed for chest pain.    . pantoprazole (PROTONIX) 40 MG tablet Take 1 tablet by mouth daily.    . ramipril (ALTACE) 5 MG capsule Take 5 mg by mouth 2 (two) times daily.    . rosuvastatin (CRESTOR) 20 MG tablet Take 1 tablet (20 mg total) by mouth daily. 30 tablet 1  . ergocalciferol (VITAMIN D2) 50000 UNITS capsule Take 50,000 Units by mouth every 3 (three) months.      No current facility-administered medications for this visit.      Past Medical History:  Diagnosis Date  . Asthma   . CHF (congestive heart failure) ([place])   . Coronary artery disease   . Diabetes mellitus    type 2  .  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 Dyslipidemia   . Hypertension   . Myocardial infarction ([place]) 2012  . Obesity   . Tubular adenoma of colon 01/12/2014   transverse colon  . VF (ventricular fibrillation) ([place])    arrest    ROS:   All systems reviewed and negative except as noted in the HPI.   Past Surgical History:  Procedure Laterality Date  . ABDOMINAL AORTAGRAM N/A 12/14/2013   Procedure: ABDOMINAL Maxcine Ham;  Surgeon: [name], MD;  Location: [facility] CATH LAB;  Service: Cardiovascular;  Laterality: N/A;  . CARDIAC CATHETERIZATION  09/16/10  . COLONOSCOPY  01/12/2014  . CORONARY ARTERY BYPASS GRAFT  2006  . IMPLANTABLE CARDIOVERTER DEFIBRILLATOR IMPLANT  2012   STJ ICD- single chamber  . LOOP RECORDER IMPLANT N/A 08/24/2014   Procedure: LOOP RECORDER IMPLANT;  Surgeon: [name], MD;  Location: [facility] CATH LAB;  Service: Cardiovascular;  Laterality: N/A;  . MASS EXCISION  2008   right shoulder  . STERNECTOMY       Family History  Problem Relation Age of Onset  . Coronary artery disease Father        early 81's  . Diabetes Father   .  Hypertension Father   . Breast cancer Mother 13       mets to colon and other  . Diabetes Sister   . Prostate cancer Brother   . Heart disease Brother   . Glaucoma Mother      Social History   Social History  . Marital status: Single    Spouse name: N/A  . Number of children: 0  . Years of education: N/A   Occupational History  . [employer]   Social History Main Topics  . Smoking status: Never Smoker  . Smokeless tobacco: Never Used  . Alcohol use No  . Drug use: No  . Sexual activity: Not on file   Other Topics Concern  . 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 Well-healed ICD incision. HEART:  Regular rate rhythm, no murmurs, no rubs, no clicks Abd:  soft, positive bowel sounds, no organomegally, no rebound, no guarding Ext:  2 plus pulses, no edema, no cyanosis, no clubbing Skin:  No rashes no nodules Neuro:  CN II through XII intact, motor grossly intact  EKG Normal sinus rhythm with incomplete right bundle branch block.  DEVICE  Normal device function (ILR and St. Jude ICD).  See PaceArt for details.   Assess/Plan:  1. Chest pressure - his symptoms have improved and he is encourated to increase his physical activity. 2. VF - he has had no recurrent ventricular arrhythmias. No change in his meds. 3. 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 No atrial fib so far 4. ICD - his St. Jude device is working normally. Will follow.   Eugene [name].D.

## 2017-02-10 NOTE — Patient Instructions (Signed)

## 2017-02-10 NOTE — Progress Notes (Signed)
[facility] Summary Report / Loop Recorder 

## 2017-02-12 ENCOUNTER — Other Ambulatory Visit: Payer: Self-pay | Admitting: Internal Medicine

## 2017-02-12 MED ORDER — METOPROLOL TARTRATE 50 MG PO TABS
50.0000 mg | ORAL_TABLET | Freq: Two times a day (BID) | ORAL | 3 refills | Status: DC
Start: 2017-02-12 — End: 2017-11-14

## 2017-02-13 ENCOUNTER — Other Ambulatory Visit: Payer: Self-pay | Admitting: Internal Medicine

## 2017-02-18 DIAGNOSIS — E1142 Type 2 diabetes mellitus with diabetic polyneuropathy: Secondary | ICD-10-CM | POA: Insufficient documentation

## 2017-02-18 DIAGNOSIS — E559 Vitamin D deficiency, unspecified: Secondary | ICD-10-CM

## 2017-02-18 DIAGNOSIS — M5417 Radiculopathy, lumbosacral region: Secondary | ICD-10-CM

## 2017-02-18 HISTORY — DX: Radiculopathy, lumbosacral region: M54.17

## 2017-02-18 HISTORY — DX: Vitamin D deficiency, unspecified: E55.9

## 2017-02-24 LAB — CUP PACEART REMOTE DEVICE CHECK
Implantable Lead Implant Date: 20120214
Implantable Lead Model: 7122
MDC IDC LEAD LOCATION: 753860
MDC IDC PG IMPLANT DT: 20160127
MDC IDC SESS DTM: 20180716014037

## 2017-03-11 ENCOUNTER — Ambulatory Visit (INDEPENDENT_AMBULATORY_CARE_PROVIDER_SITE_OTHER): Payer: BLUE CROSS/BLUE SHIELD | Admitting: *Deleted

## 2017-03-11 DIAGNOSIS — I639 Cerebral infarction, unspecified: Secondary | ICD-10-CM

## 2017-03-15 LAB — CUP PACEART REMOTE DEVICE CHECK
Implantable Lead Location: 753860
Implantable Lead Model: 7122
MDC IDC LEAD IMPLANT DT: 20120214
MDC IDC PG IMPLANT DT: 20160127
MDC IDC SESS DTM: 20180815014225

## 2017-03-15 NOTE — Progress Notes (Signed)
Loop recorder summary report 

## 2017-03-15 NOTE — Progress Notes (Signed)
[facility]link summary report received. Battery status OK. Normal device function. No new symptom episodes, tachy episodes, brady, or pause episodes. No new AF episodes. Monthly summary reports and ROV/PRN 

## 2017-04-02 ENCOUNTER — Telehealth: Payer: Self-pay | Admitting: Cardiology

## 2017-04-02 NOTE — Telephone Encounter (Signed)
Spoke w/ pt and requested that he send a manual transmission b/c his home monitor has not updated in at least 7 days.   

## 2017-04-10 ENCOUNTER — Ambulatory Visit (INDEPENDENT_AMBULATORY_CARE_PROVIDER_SITE_OTHER): Payer: BLUE CROSS/BLUE SHIELD | Admitting: *Deleted

## 2017-04-10 DIAGNOSIS — I639 Cerebral infarction, unspecified: Secondary | ICD-10-CM | POA: Diagnosis not present

## 2017-04-11 NOTE — Progress Notes (Signed)
[facility] Summary Report / Loop Recorder 

## 2017-04-15 LAB — CUP PACEART REMOTE DEVICE CHECK
Implantable Lead Location: 753860
MDC IDC LEAD IMPLANT DT: 20120214
MDC IDC PG IMPLANT DT: 20160127
MDC IDC SESS DTM: 20180914020910

## 2017-05-08 ENCOUNTER — Telehealth: Payer: Self-pay | Admitting: Cardiology

## 2017-05-08 NOTE — Telephone Encounter (Signed)
Spoke w/ pt and requested that he send a manual transmission b/c his home monitor has not updated in at least 7 days.   

## 2017-05-12 ENCOUNTER — Ambulatory Visit (INDEPENDENT_AMBULATORY_CARE_PROVIDER_SITE_OTHER): Payer: BLUE CROSS/BLUE SHIELD | Admitting: *Deleted

## 2017-05-12 DIAGNOSIS — I639 Cerebral infarction, unspecified: Secondary | ICD-10-CM | POA: Diagnosis not present

## 2017-05-12 NOTE — Progress Notes (Signed)
[facility] Summary Report / Loop Recorder 

## 2017-05-13 LAB — CUP PACEART REMOTE DEVICE CHECK
Date Time Interrogation Session: 20181014024519
Implantable Lead Implant Date: 20120214
Implantable Lead Location: 753860
Implantable Pulse Generator Implant Date: 20160127

## 2017-05-14 ENCOUNTER — Ambulatory Visit (INDEPENDENT_AMBULATORY_CARE_PROVIDER_SITE_OTHER): Payer: BLUE CROSS/BLUE SHIELD | Admitting: *Deleted

## 2017-05-14 DIAGNOSIS — I4901 Ventricular fibrillation: Secondary | ICD-10-CM | POA: Diagnosis not present

## 2017-05-16 ENCOUNTER — Encounter: Payer: Self-pay | Admitting: Cardiology

## 2017-05-16 NOTE — Progress Notes (Signed)
Remote ICD transmission.   

## 2017-06-05 LAB — CUP PACEART REMOTE DEVICE CHECK
Date Time Interrogation Session: 20181108164712
Implantable Lead Implant Date: 20120214
MDC IDC LEAD LOCATION: 753860
MDC IDC MSMT BATTERY REMAINING LONGEVITY: 51 mo
MDC IDC PG IMPLANT DT: 20120214
MDC IDC SET LEADCHNL RV PACING AMPLITUDE: 2.5 V
MDC IDC SET LEADCHNL RV PACING PULSEWIDTH: 0.5 ms
MDC IDC SET LEADCHNL RV SENSING SENSITIVITY: 0.5 mV
Pulse Gen Serial Number: 819791

## 2017-06-09 ENCOUNTER — Ambulatory Visit (INDEPENDENT_AMBULATORY_CARE_PROVIDER_SITE_OTHER): Payer: BLUE CROSS/BLUE SHIELD | Admitting: *Deleted

## 2017-06-09 DIAGNOSIS — I639 Cerebral infarction, unspecified: Secondary | ICD-10-CM

## 2017-06-10 NOTE — Progress Notes (Signed)
[facility] Summary Report / Loop Recorder 

## 2017-06-16 LAB — CUP PACEART REMOTE DEVICE CHECK
Implantable Lead Implant Date: 20120214
Implantable Lead Location: 753860
Implantable Lead Model: 7122
MDC IDC PG IMPLANT DT: 20160127
MDC IDC SESS DTM: 20181113024027

## 2017-07-02 ENCOUNTER — Encounter: Payer: Self-pay | Admitting: Internal Medicine

## 2017-07-09 ENCOUNTER — Ambulatory Visit (INDEPENDENT_AMBULATORY_CARE_PROVIDER_SITE_OTHER): Payer: BLUE CROSS/BLUE SHIELD | Admitting: *Deleted

## 2017-07-09 DIAGNOSIS — I639 Cerebral infarction, unspecified: Secondary | ICD-10-CM | POA: Diagnosis not present

## 2017-07-10 NOTE — Progress Notes (Signed)
[facility] Summary Report / Loop Recorder 

## 2017-07-16 LAB — CUP PACEART REMOTE DEVICE CHECK
Date Time Interrogation Session: 20181213030917
Implantable Lead Implant Date: 20120214
Implantable Lead Model: 7122
Implantable Pulse Generator Implant Date: 20160127
MDC IDC LEAD LOCATION: 753860

## 2017-08-01 ENCOUNTER — Telehealth: Payer: Self-pay | Admitting: Cardiology

## 2017-08-01 NOTE — Telephone Encounter (Signed)
Spoke w/ pt and requested that he send a manual transmission b/c his home monitor has not updated in at least 7 days.   

## 2017-08-08 ENCOUNTER — Ambulatory Visit (INDEPENDENT_AMBULATORY_CARE_PROVIDER_SITE_OTHER): Payer: BLUE CROSS/BLUE SHIELD | Admitting: *Deleted

## 2017-08-08 DIAGNOSIS — I639 Cerebral infarction, unspecified: Secondary | ICD-10-CM | POA: Diagnosis not present

## 2017-08-12 NOTE — Progress Notes (Signed)
[facility] Summary Report / Loop Recorder 

## 2017-08-13 ENCOUNTER — Ambulatory Visit (INDEPENDENT_AMBULATORY_CARE_PROVIDER_SITE_OTHER): Payer: BLUE CROSS/BLUE SHIELD | Admitting: *Deleted

## 2017-08-13 DIAGNOSIS — I4901 Ventricular fibrillation: Secondary | ICD-10-CM | POA: Diagnosis not present

## 2017-08-14 NOTE — Progress Notes (Signed)
Remote ICD transmission.   

## 2017-08-15 ENCOUNTER — Encounter: Payer: Self-pay | Admitting: Cardiology

## 2017-08-19 LAB — CUP PACEART REMOTE DEVICE CHECK
Date Time Interrogation Session: 20190122163208
Implantable Lead Implant Date: 20120214
Implantable Lead Location: 753860
Implantable Lead Model: 7122
Implantable Pulse Generator Implant Date: 20120214
MDC IDC LEAD IMPLANT DT: 20120214
MDC IDC LEAD LOCATION: 753860
MDC IDC PG IMPLANT DT: 20160127
MDC IDC SESS DTM: 20190112092214
Pulse Gen Serial Number: 819791

## 2017-09-03 ENCOUNTER — Telehealth: Payer: Self-pay | Admitting: Cardiology

## 2017-09-03 NOTE — Telephone Encounter (Signed)
Spoke w/ pt and requested that he send a manual transmission b/c his home monitor has not updated in at least 14 days.   

## 2017-09-08 ENCOUNTER — Ambulatory Visit (INDEPENDENT_AMBULATORY_CARE_PROVIDER_SITE_OTHER): Payer: BLUE CROSS/BLUE SHIELD | Admitting: *Deleted

## 2017-09-08 DIAGNOSIS — G458 Other transient cerebral ischemic attacks and related syndromes: Secondary | ICD-10-CM | POA: Diagnosis not present

## 2017-09-08 NOTE — Progress Notes (Signed)
[facility] Summary Report / Loop Recorder 

## 2017-10-01 LAB — CUP PACEART REMOTE DEVICE CHECK
Date Time Interrogation Session: 20190211111131
Implantable Lead Location: 753860
MDC IDC LEAD IMPLANT DT: 20120214
MDC IDC PG IMPLANT DT: 20160127

## 2017-10-13 ENCOUNTER — Ambulatory Visit (INDEPENDENT_AMBULATORY_CARE_PROVIDER_SITE_OTHER): Payer: BLUE CROSS/BLUE SHIELD | Admitting: *Deleted

## 2017-10-13 DIAGNOSIS — G458 Other transient cerebral ischemic attacks and related syndromes: Secondary | ICD-10-CM | POA: Diagnosis not present

## 2017-10-13 NOTE — Progress Notes (Signed)
[facility] Summary Report / Loop Recorder 

## 2017-11-03 ENCOUNTER — Telehealth: Payer: Self-pay | Admitting: *Deleted

## 2017-11-03 NOTE — Telephone Encounter (Signed)
LMOVM requesting call back to the DC.  Gave direct number.  LINQ at [facility] as of 11/02/17.

## 2017-11-05 NOTE — Telephone Encounter (Signed)
Spoke with patient, advised of options now that LINQ is at RRT.  Patient will plan to consider his options and discuss with Dr. Lovena Le at upcoming appointment on 02/11/18.  He will unplug his Carelink home monitor.  Advised I will order a return kit that should get to him in 4-6 weeks.  Patient is agreeable to plan and appreciative of call.

## 2017-11-12 ENCOUNTER — Ambulatory Visit (INDEPENDENT_AMBULATORY_CARE_PROVIDER_SITE_OTHER): Payer: BLUE CROSS/BLUE SHIELD | Admitting: *Deleted

## 2017-11-12 DIAGNOSIS — I4901 Ventricular fibrillation: Secondary | ICD-10-CM

## 2017-11-12 NOTE — Progress Notes (Signed)
Remote ICD transmission.   

## 2017-11-13 ENCOUNTER — Encounter: Payer: Self-pay | Admitting: Cardiology

## 2017-11-13 ENCOUNTER — Encounter: Payer: BLUE CROSS/BLUE SHIELD | Admitting: *Deleted

## 2017-11-13 ENCOUNTER — Other Ambulatory Visit: Payer: Self-pay | Admitting: Internal Medicine

## 2017-11-13 NOTE — Telephone Encounter (Signed)
Outpatient Medication Detail    Disp Refills Start End   metoprolol tartrate (LOPRESSOR) 50 MG tablet 180 tablet 3 02/12/2017    Sig - Route: Take 1 tablet (50 mg total) by mouth 2 (two) times daily. - Oral   Sent to pharmacy as: metoprolol tartrate (LOPRESSOR) 50 MG tablet   E-Prescribing Status: Receipt confirmed by pharmacy (02/12/2017 10:00 AM EDT)   Pharmacy   [facility] [place]

## 2017-11-14 ENCOUNTER — Other Ambulatory Visit: Payer: Self-pay | Admitting: Internal Medicine

## 2017-11-18 LAB — CUP PACEART REMOTE DEVICE CHECK
Brady Statistic RV Percent Paced: 1 % — CL
Date Time Interrogation Session: 20190316113624
Date Time Interrogation Session: 20190423155312
HIGH POWER IMPEDANCE MEASURED VALUE: 73 Ohm
Implantable Lead Location: 753860
Implantable Lead Model: 7122
Lead Channel Setting Pacing Amplitude: 2.5 V
Lead Channel Setting Sensing Sensitivity: 0.5 mV
MDC IDC LEAD IMPLANT DT: 20120214
MDC IDC LEAD IMPLANT DT: 20120214
MDC IDC LEAD LOCATION: 753860
MDC IDC MSMT BATTERY REMAINING LONGEVITY: 45 mo
MDC IDC MSMT BATTERY REMAINING PERCENTAGE: 40 %
MDC IDC MSMT LEADCHNL RV IMPEDANCE VALUE: 480 Ohm
MDC IDC MSMT LEADCHNL RV SENSING INTR AMPL: 11.7 mV
MDC IDC PG IMPLANT DT: 20160127
MDC IDC PG IMPLANT DT: 20120214
MDC IDC PG SERIAL: 819791
MDC IDC SET LEADCHNL RV PACING PULSEWIDTH: 0.5 ms

## 2017-12-03 ENCOUNTER — Telehealth: Payer: Self-pay | Admitting: Cardiology

## 2017-12-03 NOTE — Telephone Encounter (Signed)
Spoke w/ pt and requested that he send a manual transmission b/c his home monitor has not updated in at least 7 days.   

## 2017-12-04 ENCOUNTER — Other Ambulatory Visit: Payer: Self-pay | Admitting: Internal Medicine

## 2018-02-11 ENCOUNTER — Ambulatory Visit (INDEPENDENT_AMBULATORY_CARE_PROVIDER_SITE_OTHER): Payer: BLUE CROSS/BLUE SHIELD | Admitting: *Deleted

## 2018-02-11 ENCOUNTER — Ambulatory Visit (INDEPENDENT_AMBULATORY_CARE_PROVIDER_SITE_OTHER): Payer: BLUE CROSS/BLUE SHIELD | Admitting: Internal Medicine

## 2018-02-11 ENCOUNTER — Encounter: Payer: Self-pay | Admitting: Internal Medicine

## 2018-02-11 ENCOUNTER — Encounter: Payer: BLUE CROSS/BLUE SHIELD | Admitting: Internal Medicine

## 2018-02-11 VITALS — BP 130/62 | HR 72 | Ht 66.0 in | Wt 206.0 lb

## 2018-02-11 DIAGNOSIS — I4901 Ventricular fibrillation: Secondary | ICD-10-CM

## 2018-02-11 DIAGNOSIS — R0789 Other chest pain: Secondary | ICD-10-CM

## 2018-02-11 DIAGNOSIS — Z95810 Presence of automatic (implantable) cardiac defibrillator: Secondary | ICD-10-CM

## 2018-02-11 DIAGNOSIS — I639 Cerebral infarction, unspecified: Secondary | ICD-10-CM | POA: Diagnosis not present

## 2018-02-11 DIAGNOSIS — I208 Other forms of angina pectoris: Secondary | ICD-10-CM | POA: Diagnosis not present

## 2018-02-11 LAB — CUP PACEART INCLINIC DEVICE CHECK
Battery Remaining Longevity: 44 mo
Brady Statistic RV Percent Paced: 0 %
HighPow Impedance: 73 Ohm
Implantable Lead Implant Date: 20120214
Implantable Lead Location: 753860
Implantable Pulse Generator Implant Date: 20120214
Lead Channel Pacing Threshold Amplitude: 0.75 V
Lead Channel Setting Pacing Amplitude: 2.5 V
Lead Channel Setting Pacing Pulse Width: 0.5 ms
MDC IDC MSMT LEADCHNL RV IMPEDANCE VALUE: 475 Ohm
MDC IDC MSMT LEADCHNL RV PACING THRESHOLD PULSEWIDTH: 0.5 ms
MDC IDC MSMT LEADCHNL RV SENSING INTR AMPL: 11.7 mV
MDC IDC SESS DTM: 20190717105111
MDC IDC SET LEADCHNL RV SENSING SENSITIVITY: 0.5 mV
Pulse Gen Serial Number: 819791

## 2018-02-11 NOTE — Patient Instructions (Addendum)
Medication Instructions:  Your physician recommends that you continue on your current medications as directed. Please refer to the Current Medication list given to you today.  Labwork: None ordered.  Testing/Procedures: Your physician has requested that you have en exercise stress myoview. For further information please visit [email or web address]. Please follow instruction sheet, as given.  Please schedule for exercise myoview.   Follow-Up: Your physician wants you to follow-up in: one year with Dr. Lovena Le.   You will receive a reminder letter in the mail two months in advance. If you don't receive a letter, please call our office to schedule the follow-up appointment.  Remote monitoring is used to monitor your ICD from home. This monitoring reduces the number of office visits required to check your device to one time per year. It allows [place] to keep an eye on the functioning of your device to ensure it is working properly. You are scheduled for a device check from home on 02/11/2018. You may send your transmission at any time that day. If you have a wireless device, the transmission will be sent automatically. After your physician reviews your transmission, you will receive a postcard with your next transmission date.  Any Other Special Instructions Will Be Listed Below (If Applicable).  If you need a refill on your cardiac medications before your next appointment, please call your pharmacy.

## 2018-02-11 NOTE — H&P (View-Only) (Signed)
HPI [name] returns today for followup. 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 He has returned to work. The etiology of his arrest is still unclear. He has coronary disease with preserved left ventricular function.  He is trying to do better with his diet. He does not have palpitations.  He has not had any ICD shocks in the interim. He had a cryptogenic stroke and underwent insertion of an ILR 3 years ago but has not yet had any atrial fib. He has had chest pressure and sob with exertion. He also has some leg/knee discomfort.   No Known Allergies   Current Outpatient Medications  Medication Sig Dispense Refill  . aspirin 81 MG tablet Take 81 mg by mouth daily.      . clopidogrel (PLAVIX) 75 MG tablet Take 75 mg by mouth daily.    . ergocalciferol (VITAMIN D2) 50000 UNITS capsule Take 50,000 Units by mouth every 3 (three) months.     . Insulin Glargine (LANTUS SOLOSTAR) 100 UNIT/ML Solostar Pen Inject 70 Units into the skin daily.    . Liraglutide (VICTOZA) 18 MG/3ML SOPN Inject 1.8 mg into the skin daily.    Marland Kitchen LORazepam (ATIVAN PO) Take 1 tablet by mouth daily. Pt unsure of strength    . metFORMIN (GLUCOPHAGE) 500 MG tablet Take 1,000 mg by mouth 2 (two) times daily with a meal.     . metoprolol tartrate (LOPRESSOR) 50 MG tablet Take 1 tablet (50 mg total) by mouth 2 (two) times daily. Please keep upcoming appt for future refills. Thank you 180 tablet 0  . nitroGLYCERIN (NITROSTAT) 0.4 MG SL tablet Place 0.4 mg under the tongue every 5 (five) minutes as needed for chest pain.    . pantoprazole (PROTONIX) 40 MG tablet Take 1 tablet by mouth daily.    . rosuvastatin (CRESTOR) 20 MG tablet Take 1 tablet (20 mg total) by mouth daily. 30 tablet 11   No current facility-administered medications for this visit.      Past Medical History:  Diagnosis Date  . Asthma   . CHF (congestive heart failure) ([name])   .  Coronary artery disease   . Diabetes mellitus    type 2  . 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 Dyslipidemia   . Hypertension   . Myocardial infarction ([name]) 2012  . Obesity   . Tubular adenoma of colon 01/12/2014   transverse colon  . VF (ventricular fibrillation) ([name])    arrest    ROS:   All systems reviewed and negative except as noted in the HPI.   Past Surgical History:  Procedure Laterality Date  . ABDOMINAL AORTAGRAM N/A 12/14/2013   Procedure: ABDOMINAL Eugene [name];  Surgeon: [name], MD;  Location: [facility] CATH LAB;  Service: Cardiovascular;  Laterality: N/A;  . CARDIAC CATHETERIZATION  09/16/10  . COLONOSCOPY  01/12/2014  . CORONARY ARTERY BYPASS GRAFT  2006  . IMPLANTABLE CARDIOVERTER DEFIBRILLATOR IMPLANT  2012   STJ ICD- single chamber  . LOOP RECORDER IMPLANT N/A 08/24/2014   Procedure: LOOP RECORDER IMPLANT;  Surgeon: [name], MD;  Location: [facility] CATH LAB;  Service: Cardiovascular;  Laterality: N/A;  . MASS EXCISION  2008   right shoulder  . STERNECTOMY       Family History  Problem Relation Age of Onset  . Coronary artery disease Father        early 67's  .  Diabetes Father   . Hypertension Father   . Breast cancer Mother 69       mets to colon and other  . Diabetes Sister   . Prostate cancer Brother   . Heart disease Brother   . Glaucoma Mother      Social History   Socioeconomic History  . Marital status: Single    Spouse name: Not on file  . Number of children: 0  . Years of education: Not on file  . Highest education level: Not on file  Occupational History  . Occupation: STORE Programme researcher, broadcasting/film/video: [employer]  Social Needs  . Financial resource strain: Not on file  . Food insecurity:    Worry: Not on file    Inability: Not on file  . Transportation needs:    Medical: Not on file    Non-medical: Not on file  Tobacco Use  . Smoking status: Never Smoker  . Smokeless tobacco: Never Used  Substance and Sexual Activity  .  Alcohol use: No  . Drug use: No  . Sexual activity: Not on file  Lifestyle  . Physical activity:    Days per week: Not on file    Minutes per session: Not on file  . Stress: Not on file  Relationships  . Social connections:    Talks on phone: Not on file    Gets together: Not on file    Attends religious service: Not on file    Active member of club or organization: Not on file    Attends meetings of clubs or organizations: Not on file    Relationship status: Not on file  . Intimate partner violence:    Fear of current or ex partner: Not on file    Emotionally abused: Not on file    Physically abused: Not on file    Forced sexual activity: Not on file  Other Topics Concern  . 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[place], [place]   6 caffeinated beverages daily     BP 130/62   Pulse 72   Ht 5\' 6"  (1.676 m)   Wt 206 lb (93.4 kg)   BMI 33.25 kg/m   Physical Exam:  Well appearing middle aged man, NAD HEENT: Unremarkable Neck:  6 cm JVD, no thyromegally Lymphatics:  No adenopathy Back:  No CVA tenderness Lungs:  Clear with no wheezes HEART:  Regular rate rhythm, no murmurs, no rubs, no clicks Abd:  soft, positive bowel sounds, no organomegally, no rebound, no guarding Ext:  2 plus pulses, no edema, no cyanosis, no clubbing Skin:  No rashes no nodules Neuro:  CN II through XII intact, motor grossly intact   DEVICE  Normal device function.  See PaceArt for details.   Assess/Plan: 1. Chest pressure and sob - this has worsened in the past year. He will undergo stress testing. If any progression he will undergo left heart cath. 2. ICD - his St. Jude device is working normally.  3. Cryptogenic stroke. - he has reached end of service on his device. He will not have his device removed. 4. ICD/VF arrest - he is doing well from an arrhythmia perspective. He has had none. His St. Jude single chamber ICD is  working normally with 3.7 years of longevity remaining.  Eugene [name].D.

## 2018-02-11 NOTE — Progress Notes (Addendum)
HPI Mr. Frappier returns today for followup. 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 He has returned to work. The etiology of his arrest is still unclear. He has coronary disease with preserved left ventricular function.  He is trying to do better with his diet. He does not have palpitations.  He has not had any ICD shocks in the interim. He had a cryptogenic stroke and underwent insertion of an ILR 3 years ago but has not yet had any atrial fib. He has had chest pressure and sob with exertion. He also has some leg/knee discomfort.   No Known Allergies   Current Outpatient Medications  Medication Sig Dispense Refill  . aspirin 81 MG tablet Take 81 mg by mouth daily.      . clopidogrel (PLAVIX) 75 MG tablet Take 75 mg by mouth daily.    . ergocalciferol (VITAMIN D2) 50000 UNITS capsule Take 50,000 Units by mouth every 3 (three) months.     . Insulin Glargine (LANTUS SOLOSTAR) 100 UNIT/ML Solostar Pen Inject 70 Units into the skin daily.    . Liraglutide (VICTOZA) 18 MG/3ML SOPN Inject 1.8 mg into the skin daily.    Marland Kitchen LORazepam (ATIVAN PO) Take 1 tablet by mouth daily. Pt unsure of strength    . metFORMIN (GLUCOPHAGE) 500 MG tablet Take 1,000 mg by mouth 2 (two) times daily with a meal.     . metoprolol tartrate (LOPRESSOR) 50 MG tablet Take 1 tablet (50 mg total) by mouth 2 (two) times daily. Please keep upcoming appt for future refills. Thank you 180 tablet 0  . nitroGLYCERIN (NITROSTAT) 0.4 MG SL tablet Place 0.4 mg under the tongue every 5 (five) minutes as needed for chest pain.    . pantoprazole (PROTONIX) 40 MG tablet Take 1 tablet by mouth daily.    . rosuvastatin (CRESTOR) 20 MG tablet Take 1 tablet (20 mg total) by mouth daily. 30 tablet 11   No current facility-administered medications for this visit.      Past Medical History:  Diagnosis Date  . Asthma   . CHF (congestive heart failure) ([place])   .  Coronary artery disease   . Diabetes mellitus    type 2  . 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 Dyslipidemia   . Hypertension   . Myocardial infarction ([place]) 2012  . Obesity   . Tubular adenoma of colon 01/12/2014   transverse colon  . VF (ventricular fibrillation) ([place])    arrest    ROS:   All systems reviewed and negative except as noted in the HPI.   Past Surgical History:  Procedure Laterality Date  . ABDOMINAL AORTAGRAM N/A 12/14/2013   Procedure: ABDOMINAL Maxcine Ham;  Surgeon: [name], MD;  Location: [facility] CATH LAB;  Service: Cardiovascular;  Laterality: N/A;  . CARDIAC CATHETERIZATION  09/16/10  . COLONOSCOPY  01/12/2014  . CORONARY ARTERY BYPASS GRAFT  2006  . IMPLANTABLE CARDIOVERTER DEFIBRILLATOR IMPLANT  2012   STJ ICD- single chamber  . LOOP RECORDER IMPLANT N/A 08/24/2014   Procedure: LOOP RECORDER IMPLANT;  Surgeon: [name], MD;  Location: [facility] CATH LAB;  Service: Cardiovascular;  Laterality: N/A;  . MASS EXCISION  2008   right shoulder  . STERNECTOMY       Family History  Problem Relation Age of Onset  . Coronary artery disease Father        early 89's  .  Diabetes Father   . Hypertension Father   . Breast cancer Mother 21       mets to colon and other  . Diabetes Sister   . Prostate cancer Brother   . Heart disease Brother   . Glaucoma Mother      Social History   Socioeconomic History  . Marital status: Single    Spouse name: Not on file  . Number of children: 0  . Years of education: Not on file  . Highest education level: Not on file  Occupational History  . Occupation: STORE Programme researcher, broadcasting/film/video: [employer]  Social Needs  . Financial resource strain: Not on file  . Food insecurity:    Worry: Not on file    Inability: Not on file  . Transportation needs:    Medical: Not on file    Non-medical: Not on file  Tobacco Use  . Smoking status: Never Smoker  . Smokeless tobacco: Never Used  Substance and Sexual Activity  .  Alcohol use: No  . Drug use: No  . Sexual activity: Not on file  Lifestyle  . Physical activity:    Days per week: Not on file    Minutes per session: Not on file  . Stress: Not on file  Relationships  . Social connections:    Talks on phone: Not on file    Gets together: Not on file    Attends religious service: Not on file    Active member of club or organization: Not on file    Attends meetings of clubs or organizations: Not on file    Relationship status: Not on file  . Intimate partner violence:    Fear of current or ex partner: Not on file    Emotionally abused: Not on file    Physically abused: Not on file    Forced sexual activity: Not on file  Other Topics Concern  . 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[place], [place]   6 caffeinated beverages daily     BP 130/62   Pulse 72   Ht 5\' 6"  (1.676 m)   Wt 206 lb (93.4 kg)   BMI 33.25 kg/m   Physical Exam:  Well appearing middle aged man, NAD HEENT: Unremarkable Neck:  6 cm JVD, no thyromegally Lymphatics:  No adenopathy Back:  No CVA tenderness Lungs:  Clear with no wheezes HEART:  Regular rate rhythm, no murmurs, no rubs, no clicks Abd:  soft, positive bowel sounds, no organomegally, no rebound, no guarding Ext:  2 plus pulses, no edema, no cyanosis, no clubbing Skin:  No rashes no nodules Neuro:  CN II through XII intact, motor grossly intact   DEVICE  Normal device function.  See PaceArt for details.   Assess/Plan: 1. Chest pressure and sob - this has worsened in the past year. He will undergo stress testing. If any progression he will undergo left heart cath. 2. ICD - his St. Jude device is working normally.  3. Cryptogenic stroke. - he has reached end of service on his device. He will not have his device removed. 4. ICD/VF arrest - he is doing well from an arrhythmia perspective. He has had none. His St. Jude single chamber ICD is  working normally with 3.7 years of longevity remaining.  Taneshia Lorence,M.D.   Cardiology Addendum  The patient has undergone exercise myoview stress testing which  demonstrates a large reversible anterior defect. He will undergo left heart cath. I suspect at least LAD disease if not multi-vessel disease  Mikle Bosworth.D.

## 2018-02-11 NOTE — Progress Notes (Signed)
Remote ICD transmission.   

## 2018-02-12 ENCOUNTER — Telehealth (HOSPITAL_COMMUNITY): Payer: Self-pay | Admitting: *Deleted

## 2018-02-12 ENCOUNTER — Other Ambulatory Visit: Payer: Self-pay | Admitting: Internal Medicine

## 2018-02-12 ENCOUNTER — Encounter: Payer: Self-pay | Admitting: Cardiology

## 2018-02-12 NOTE — Telephone Encounter (Signed)
Patient given detailed instructions per Myocardial Perfusion Study Information Sheet for the test on 02/16/18. Patient notified to arrive 15 minutes early and that it is imperative to arrive on time for appointment to keep from having the test rescheduled.  If you need to cancel or reschedule your appointment, please call the office within 24 hours of your appointment. . Patient verbalized understanding. Kirstie Peri

## 2018-02-16 ENCOUNTER — Telehealth: Payer: Self-pay | Admitting: *Deleted

## 2018-02-16 ENCOUNTER — Other Ambulatory Visit: Payer: BLUE CROSS/BLUE SHIELD | Admitting: *Deleted

## 2018-02-16 ENCOUNTER — Ambulatory Visit (HOSPITAL_COMMUNITY): Payer: BLUE CROSS/BLUE SHIELD | Attending: Cardiovascular Disease

## 2018-02-16 ENCOUNTER — Encounter: Payer: Self-pay | Admitting: *Deleted

## 2018-02-16 DIAGNOSIS — Z95810 Presence of automatic (implantable) cardiac defibrillator: Secondary | ICD-10-CM | POA: Insufficient documentation

## 2018-02-16 DIAGNOSIS — R079 Chest pain, unspecified: Secondary | ICD-10-CM | POA: Diagnosis present

## 2018-02-16 DIAGNOSIS — Z01818 Encounter for other preprocedural examination: Secondary | ICD-10-CM | POA: Diagnosis not present

## 2018-02-16 DIAGNOSIS — R0609 Other forms of dyspnea: Secondary | ICD-10-CM | POA: Insufficient documentation

## 2018-02-16 DIAGNOSIS — I2510 Atherosclerotic heart disease of native coronary artery without angina pectoris: Secondary | ICD-10-CM | POA: Insufficient documentation

## 2018-02-16 DIAGNOSIS — Z8249 Family history of ischemic heart disease and other diseases of the circulatory system: Secondary | ICD-10-CM | POA: Insufficient documentation

## 2018-02-16 DIAGNOSIS — E119 Type 2 diabetes mellitus without complications: Secondary | ICD-10-CM | POA: Insufficient documentation

## 2018-02-16 DIAGNOSIS — R9439 Abnormal result of other cardiovascular function study: Secondary | ICD-10-CM | POA: Insufficient documentation

## 2018-02-16 DIAGNOSIS — I208 Other forms of angina pectoris: Secondary | ICD-10-CM | POA: Diagnosis not present

## 2018-02-16 DIAGNOSIS — Z01812 Encounter for preprocedural laboratory examination: Secondary | ICD-10-CM

## 2018-02-16 LAB — BASIC METABOLIC PANEL
BUN / CREAT RATIO: 11 (ref 10–24)
BUN: 10 mg/dL (ref 8–27)
CO2: 20 mmol/L (ref 20–29)
Calcium: 9.2 mg/dL (ref 8.6–10.2)
Chloride: 102 mmol/L (ref 96–106)
Creatinine, Ser: 0.91 mg/dL (ref 0.76–1.27)
GFR calc Af Amer: 104 mL/min/{1.73_m2} (ref >59)
GFR calc non Af Amer: 90 mL/min/{1.73_m2} (ref >59)
Glucose: 312 mg/dL — ABNORMAL HIGH (ref 65–99)
POTASSIUM: 4.1 mmol/L (ref 3.5–5.2)
Sodium: 139 mmol/L (ref 134–144)

## 2018-02-16 LAB — MYOCARDIAL PERFUSION IMAGING
CSEPPHR: 110 {beats}/min
LV dias vol: 102 mL (ref 62–150)
LV sys vol: 60 mL
RATE: 0.4
Rest HR: 65 {beats}/min
SDS: 8
SRS: 13
SSS: 21
TID: 1.04

## 2018-02-16 LAB — CBC
Hematocrit: 41.3 % (ref 37.5–51.0)
Hemoglobin: 14 g/dL (ref 13.0–17.7)
MCH: 27.9 pg (ref 26.6–33.0)
MCHC: 33.9 g/dL (ref 31.5–35.7)
MCV: 82 fL (ref 79–97)
Platelets: 212 10*3/uL (ref 150–450)
RBC: 5.02 x10E6/uL (ref 4.14–5.80)
RDW: 14.7 % (ref 12.3–15.4)
WBC: 10.1 10*3/uL (ref 3.4–10.8)

## 2018-02-16 MED ORDER — TECHNETIUM TC 99M TETROFOSMIN IV KIT
31.6000 | PACK | Freq: Once | INTRAVENOUS | Status: AC | PRN
  Administered 2018-02-16: 31.6 via INTRAVENOUS
  Filled 2018-02-16: qty 32

## 2018-02-16 MED ORDER — REGADENOSON 0.4 MG/5ML IV SOLN
0.4000 mg | Freq: Once | INTRAVENOUS | Status: AC
  Administered 2018-02-16: 0.4 mg via INTRAVENOUS

## 2018-02-16 MED ORDER — TECHNETIUM TC 99M TETROFOSMIN IV KIT
10.1000 | PACK | Freq: Once | INTRAVENOUS | Status: AC | PRN
  Administered 2018-02-16: 10.1 via INTRAVENOUS
  Filled 2018-02-16: qty 11

## 2018-02-16 NOTE — Telephone Encounter (Signed)
Was able to schedule cath for Thursday at 12N.  Pt to report at 10 am.  Instruction sheet completed and pt area it will be at the front desk to pick up when he comes back today for blood work.

## 2018-02-16 NOTE — Telephone Encounter (Signed)
Per Dr Cristopher Peru - pt had abnormal nuclear stress test and needs a left heart cath with possible intervention. He would like it to be on Thursday 7/25 with Dr Burt Knack. Pt will need to come back for blood work and instructions.

## 2018-02-18 ENCOUNTER — Telehealth: Payer: Self-pay | Admitting: *Deleted

## 2018-02-18 NOTE — Telephone Encounter (Signed)
Pt had blood work as ordered.  Received cath instructions.

## 2018-02-18 NOTE — Telephone Encounter (Signed)
Pt contacted pre-catheterization scheduled at [facility] for: Thrusday February 19, 2018 12 N Verified arrival time and place: [place] Entrance A at: 10 AM  No solid food after midnight prior to cath, clear liquids until 5 AM day of procedure. Verified no known allergies.  Hold: Insulin AM of procedure Victoza AM of procedure Metformin AM of procedure and 48 hours post procedure  Except hold medications AM meds can be  taken pre-cath with sip of water including: ASA 81 mg Clopidogrel 75 mg  Confirmed patient has responsible person to drive home post procedure and for 24 hours after you arrive home: yes

## 2018-02-19 ENCOUNTER — Ambulatory Visit (HOSPITAL_COMMUNITY)
Admission: RE | Disposition: A | Discharge status: Home / Self Care | Payer: Self-pay | Source: Ambulatory Visit | Attending: Cardiology

## 2018-02-19 ENCOUNTER — Ambulatory Visit (HOSPITAL_COMMUNITY)
Admission: RE | Admit: 2018-02-19 | Discharge: 2018-02-19 | Disposition: A | Discharge status: Home / Self Care | Payer: BLUE CROSS/BLUE SHIELD | Source: Ambulatory Visit | Attending: Cardiology | Admitting: Cardiology

## 2018-02-19 ENCOUNTER — Encounter (HOSPITAL_COMMUNITY): Payer: Self-pay | Admitting: *Deleted

## 2018-02-19 DIAGNOSIS — I2584 Coronary atherosclerosis due to calcified coronary lesion: Secondary | ICD-10-CM | POA: Insufficient documentation

## 2018-02-19 DIAGNOSIS — Z833 Family history of diabetes mellitus: Secondary | ICD-10-CM | POA: Diagnosis not present

## 2018-02-19 DIAGNOSIS — I25709 Atherosclerosis of coronary artery bypass graft(s), unspecified, with unspecified angina pectoris: Secondary | ICD-10-CM | POA: Diagnosis present

## 2018-02-19 DIAGNOSIS — J45909 Unspecified asthma, uncomplicated: Secondary | ICD-10-CM | POA: Diagnosis not present

## 2018-02-19 DIAGNOSIS — I252 Old myocardial infarction: Secondary | ICD-10-CM | POA: Diagnosis not present

## 2018-02-19 DIAGNOSIS — E119 Type 2 diabetes mellitus without complications: Secondary | ICD-10-CM | POA: Insufficient documentation

## 2018-02-19 DIAGNOSIS — Z800 Family history of malignant neoplasm of digestive organs: Secondary | ICD-10-CM | POA: Insufficient documentation

## 2018-02-19 DIAGNOSIS — Z7901 Long term (current) use of anticoagulants: Secondary | ICD-10-CM | POA: Diagnosis not present

## 2018-02-19 DIAGNOSIS — Z86018 Personal history of other benign neoplasm: Secondary | ICD-10-CM | POA: Diagnosis not present

## 2018-02-19 DIAGNOSIS — Z803 Family history of malignant neoplasm of breast: Secondary | ICD-10-CM | POA: Diagnosis not present

## 2018-02-19 DIAGNOSIS — Z794 Long term (current) use of insulin: Secondary | ICD-10-CM | POA: Diagnosis not present

## 2018-02-19 DIAGNOSIS — Z8042 Family history of malignant neoplasm of prostate: Secondary | ICD-10-CM | POA: Insufficient documentation

## 2018-02-19 DIAGNOSIS — Z95810 Presence of automatic (implantable) cardiac defibrillator: Secondary | ICD-10-CM | POA: Diagnosis not present

## 2018-02-19 DIAGNOSIS — Z951 Presence of aortocoronary bypass graft: Secondary | ICD-10-CM | POA: Insufficient documentation

## 2018-02-19 DIAGNOSIS — Z79899 Other long term (current) drug therapy: Secondary | ICD-10-CM | POA: Insufficient documentation

## 2018-02-19 DIAGNOSIS — I25810 Atherosclerosis of coronary artery bypass graft(s) without angina pectoris: Secondary | ICD-10-CM | POA: Insufficient documentation

## 2018-02-19 DIAGNOSIS — E669 Obesity, unspecified: Secondary | ICD-10-CM | POA: Diagnosis not present

## 2018-02-19 DIAGNOSIS — Z7982 Long term (current) use of aspirin: Secondary | ICD-10-CM | POA: Diagnosis not present

## 2018-02-19 DIAGNOSIS — I509 Heart failure, unspecified: Secondary | ICD-10-CM | POA: Diagnosis not present

## 2018-02-19 DIAGNOSIS — Z8249 Family history of ischemic heart disease and other diseases of the circulatory system: Secondary | ICD-10-CM | POA: Diagnosis not present

## 2018-02-19 DIAGNOSIS — Z9889 Other specified postprocedural states: Secondary | ICD-10-CM | POA: Insufficient documentation

## 2018-02-19 DIAGNOSIS — I110 Hypertensive heart disease with heart failure: Secondary | ICD-10-CM | POA: Diagnosis not present

## 2018-02-19 DIAGNOSIS — E785 Hyperlipidemia, unspecified: Secondary | ICD-10-CM | POA: Diagnosis not present

## 2018-02-19 DIAGNOSIS — I209 Angina pectoris, unspecified: Secondary | ICD-10-CM | POA: Diagnosis present

## 2018-02-19 DIAGNOSIS — Z6833 Body mass index (BMI) 33.0-33.9, adult: Secondary | ICD-10-CM | POA: Insufficient documentation

## 2018-02-19 DIAGNOSIS — Z8673 Personal history of transient ischemic attack (TIA), and cerebral infarction without residual deficits: Secondary | ICD-10-CM | POA: Insufficient documentation

## 2018-02-19 DIAGNOSIS — R9439 Abnormal result of other cardiovascular function study: Secondary | ICD-10-CM | POA: Diagnosis present

## 2018-02-19 HISTORY — DX: Angina pectoris, unspecified: I20.9

## 2018-02-19 HISTORY — DX: Abnormal result of other cardiovascular function study: R94.39

## 2018-02-19 HISTORY — PX: LEFT HEART CATH AND CORS/GRAFTS ANGIOGRAPHY: CATH118250

## 2018-02-19 LAB — GLUCOSE, CAPILLARY
GLUCOSE-CAPILLARY: 163 mg/dL — AB (ref 70–99)
Glucose-Capillary: 230 mg/dL — ABNORMAL HIGH (ref 70–99)

## 2018-02-19 SURGERY — LEFT HEART CATH AND CORS/GRAFTS ANGIOGRAPHY
Anesthesia: LOCAL

## 2018-02-19 MED ORDER — FENTANYL CITRATE (PF) 100 MCG/2ML IJ SOLN
INTRAMUSCULAR | Status: DC | PRN
  Administered 2018-02-19: 50 ug via INTRAVENOUS

## 2018-02-19 MED ORDER — SODIUM CHLORIDE 0.9% FLUSH: 3.0000 mL | INTRAVENOUS | Status: DC | PRN

## 2018-02-19 MED ORDER — CLOPIDOGREL BISULFATE 75 MG PO TABS: 75.0000 mg | ORAL_TABLET | ORAL | Status: DC

## 2018-02-19 MED ORDER — ACETAMINOPHEN 325 MG PO TABS: 650.0000 mg | ORAL_TABLET | ORAL | Status: DC | PRN

## 2018-02-19 MED ORDER — HEPARIN (PORCINE) IN NACL 1000-0.9 UT/500ML-% IV SOLN
INTRAVENOUS | Status: DC | PRN
  Administered 2018-02-19 (×2): 500 mL

## 2018-02-19 MED ORDER — LIDOCAINE HCL (PF) 1 % IJ SOLN
INTRAMUSCULAR | Status: AC
  Filled 2018-02-19: qty 30

## 2018-02-19 MED ORDER — SODIUM CHLORIDE 0.9 % WEIGHT BASED INFUSION: 1.0000 mL/kg/h | INTRAVENOUS | Status: DC

## 2018-02-19 MED ORDER — MIDAZOLAM HCL 2 MG/2ML IJ SOLN
INTRAMUSCULAR | Status: AC
  Filled 2018-02-19: qty 2

## 2018-02-19 MED ORDER — IOPAMIDOL (ISOVUE-370) INJECTION 76%
INTRAVENOUS | Status: AC
  Filled 2018-02-19: qty 125

## 2018-02-19 MED ORDER — HEPARIN SODIUM (PORCINE) 1000 UNIT/ML IJ SOLN
INTRAMUSCULAR | Status: DC | PRN
  Administered 2018-02-19: 5000 [IU] via INTRAVENOUS

## 2018-02-19 MED ORDER — SODIUM CHLORIDE 0.9 % IV SOLN: 250.0000 mL | INTRAVENOUS | Status: DC | PRN

## 2018-02-19 MED ORDER — NITROGLYCERIN 1 MG/10 ML FOR IR/CATH LAB
INTRA_ARTERIAL | Status: AC
  Filled 2018-02-19: qty 10

## 2018-02-19 MED ORDER — LIDOCAINE HCL (PF) 1 % IJ SOLN
INTRAMUSCULAR | Status: DC | PRN
  Administered 2018-02-19: 2 mL via INTRADERMAL

## 2018-02-19 MED ORDER — SODIUM CHLORIDE 0.9 % WEIGHT BASED INFUSION
3.0000 mL/kg/h | INTRAVENOUS | Status: AC
  Administered 2018-02-19: 3 mL/kg/h via INTRAVENOUS

## 2018-02-19 MED ORDER — SODIUM CHLORIDE 0.9 % IV SOLN
INTRAVENOUS | Status: DC
  Administered 2018-02-19: 14:00:00 via INTRAVENOUS

## 2018-02-19 MED ORDER — VERAPAMIL HCL 2.5 MG/ML IV SOLN
INTRAVENOUS | Status: DC | PRN
  Administered 2018-02-19: 10 mL via INTRA_ARTERIAL

## 2018-02-19 MED ORDER — SODIUM CHLORIDE 0.9% FLUSH: 3.0000 mL | Freq: Two times a day (BID) | INTRAVENOUS | Status: DC

## 2018-02-19 MED ORDER — RANOLAZINE ER 500 MG PO TB12: 500.0000 mg | ORAL_TABLET | Freq: Two times a day (BID) | ORAL | 3 refills | Status: DC

## 2018-02-19 MED ORDER — VERAPAMIL HCL 2.5 MG/ML IV SOLN
INTRAVENOUS | Status: AC
  Filled 2018-02-19: qty 2

## 2018-02-19 MED ORDER — ASPIRIN 81 MG PO CHEW: 81.0000 mg | CHEWABLE_TABLET | ORAL | Status: DC

## 2018-02-19 MED ORDER — IOPAMIDOL (ISOVUE-370) INJECTION 76%
INTRAVENOUS | Status: DC | PRN
  Administered 2018-02-19: 85 mL via INTRAVENOUS

## 2018-02-19 MED ORDER — HEPARIN (PORCINE) IN NACL 1000-0.9 UT/500ML-% IV SOLN
INTRAVENOUS | Status: AC
  Filled 2018-02-19: qty 1000

## 2018-02-19 MED ORDER — FENTANYL CITRATE (PF) 100 MCG/2ML IJ SOLN
INTRAMUSCULAR | Status: AC
  Filled 2018-02-19: qty 2

## 2018-02-19 MED ORDER — ONDANSETRON HCL 4 MG/2ML IJ SOLN: 4.0000 mg | Freq: Four times a day (QID) | INTRAMUSCULAR | Status: DC | PRN

## 2018-02-19 MED ORDER — MIDAZOLAM HCL 2 MG/2ML IJ SOLN
INTRAMUSCULAR | Status: DC | PRN
  Administered 2018-02-19: 2 mg via INTRAVENOUS

## 2018-02-19 MED ORDER — HEPARIN SODIUM (PORCINE) 1000 UNIT/ML IJ SOLN
INTRAMUSCULAR | Status: AC
  Filled 2018-02-19: qty 1

## 2018-02-19 SURGICAL SUPPLY — 11 items
CATH INFINITI 5FR MULTPACK ANG (CATHETERS) ×2 IMPLANT
DEVICE RAD COMP TR BAND LRG (VASCULAR PRODUCTS) ×2 IMPLANT
GLIDESHEATH SLEND A-KIT 6F 22G (SHEATH) ×2 IMPLANT
GLIDESHEATH SLEND SS 6F .021 (SHEATH) IMPLANT
GUIDEWIRE INQWIRE 1.5J.035X260 (WIRE) ×1 IMPLANT
INQWIRE 1.5J .035X260CM (WIRE) ×2
KIT HEART LEFT (KITS) ×2 IMPLANT
PACK CARDIAC CATHETERIZATION (CUSTOM PROCEDURE TRAY) ×2 IMPLANT
SYR MEDRAD MARK V 150ML (SYRINGE) ×2 IMPLANT
TRANSDUCER W/STOPCOCK (MISCELLANEOUS) ×2 IMPLANT
TUBING CIL FLEX 10 FLL-RA (TUBING) ×2 IMPLANT

## 2018-02-19 NOTE — Interval H&P Note (Signed)
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    The various methods of treatment have been discussed with the patient and family. After consideration of risks, benefits and other options for treatment, the patient has consented to  Procedure(s): LEFT HEART CATH AND CORONARY ANGIOGRAPHY (N/A) as a surgical intervention .  The patient's history has been reviewed, patient examined, no change in status, stable for surgery.  I have reviewed the patient's chart and labs.  Questions were answered to the patient's satisfaction.    Cath Lab Visit (complete for each Cath Lab visit)  Clinical Evaluation Leading to the Procedure:   ACS: No.  Non-ACS:    Anginal Classification: CCS III  Anti-ischemic medical therapy: Minimal Therapy (1 class of medications)  Non-Invasive Test Results: Intermediate-risk stress test findings: cardiac mortality 1-3%/year  Prior CABG: Previous CABG   Glenetta Hew

## 2018-02-19 NOTE — Discharge Instructions (Signed)

## 2018-02-19 NOTE — Research (Signed)
CADFEM Informed Consent   Subject Name: Eugene [name]  Subject met inclusion and exclusion criteria.  The informed consent form, study requirements and expectations were reviewed with the subject and questions and concerns were addressed prior to the signing of the consent form.  The subject verbalized understanding of the trail requirements.  The subject agreed to participate in the CADFEM trial and signed the informed consent.  The informed consent was obtained prior to performance of any protocol-specific procedures for the subject.  A copy of the signed informed consent was given to the subject and a copy was placed in the subject's medical record.  [name] 02/19/2018, 11:05 AM

## 2018-02-20 ENCOUNTER — Encounter (HOSPITAL_COMMUNITY): Payer: Self-pay | Admitting: Cardiology

## 2018-02-20 MED FILL — Nitroglycerin IV Soln 100 MCG/ML in D5W: INTRA_ARTERIAL | Qty: 10 | Status: AC

## 2018-02-22 LAB — CUP PACEART REMOTE DEVICE CHECK
Date Time Interrogation Session: 20190728202028
Implantable Lead Implant Date: 20120214
Implantable Lead Location: 753860
Implantable Lead Model: 7122
Implantable Pulse Generator Implant Date: 20120214
MDC IDC PG SERIAL: 819791

## 2018-03-11 ENCOUNTER — Ambulatory Visit (INDEPENDENT_AMBULATORY_CARE_PROVIDER_SITE_OTHER): Payer: BLUE CROSS/BLUE SHIELD | Admitting: Internal Medicine

## 2018-03-11 ENCOUNTER — Encounter: Payer: Self-pay | Admitting: Internal Medicine

## 2018-03-11 VITALS — BP 122/74 | HR 77 | Ht 66.0 in | Wt 205.2 lb

## 2018-03-11 DIAGNOSIS — I639 Cerebral infarction, unspecified: Secondary | ICD-10-CM

## 2018-03-11 DIAGNOSIS — Z95810 Presence of automatic (implantable) cardiac defibrillator: Secondary | ICD-10-CM

## 2018-03-11 DIAGNOSIS — I25709 Atherosclerosis of coronary artery bypass graft(s), unspecified, with unspecified angina pectoris: Secondary | ICD-10-CM | POA: Diagnosis not present

## 2018-03-11 DIAGNOSIS — I4901 Ventricular fibrillation: Secondary | ICD-10-CM

## 2018-03-11 MED ORDER — METOPROLOL SUCCINATE ER 50 MG PO TB24: 50.0000 mg | ORAL_TABLET | Freq: Every day | ORAL | 3 refills | Status: DC

## 2018-03-11 NOTE — Progress Notes (Signed)
HPI [name] returns today for followup. He is a pleasant 62 yo man with a h/o VF arrest, s/p ICD insertion who's ventricular arrhythmias have been controlled. He had chest pressure and a stress test which was abnormal and heart cath with graft disease. His EF is 42%. He has class 1 symptoms. He works a lot but is fairly sedentary. He does not smoke.   No Known Allergies   Current Outpatient Medications  Medication Sig Dispense Refill  . aspirin 81 MG tablet Take 1 tablet by mouth daily.    . clopidogrel (PLAVIX) 75 MG tablet Take 75 mg by mouth daily.    . Glucosamine-Chondroit-Vit C-Mn (GLUCOSAMINE CHONDR 1500 COMPLX) CAPS Take 1 capsule by mouth 2 (two) times daily.    . Insulin Glargine (LANTUS SOLOSTAR) 100 UNIT/ML Solostar Pen Inject 70 Units into the skin daily.    . Liraglutide (VICTOZA) 18 MG/3ML SOPN Inject 1.8 mg into the skin daily.    . metFORMIN (GLUCOPHAGE) 500 MG tablet Take 1,000 mg by mouth 2 (two) times daily with a meal.     . metoprolol tartrate (LOPRESSOR) 50 MG tablet TAKE ONE TABLET BY MOUTH TWICE DAILY (Please keep upcoming appt for future refills) 180 tablet 3  . nitroGLYCERIN (NITROSTAT) 0.4 MG SL tablet Place 0.4 mg under the tongue every 5 (five) minutes as needed for chest pain.    . pantoprazole (PROTONIX) 40 MG tablet Take 40 mg by mouth daily.     . ramipril (ALTACE) 5 MG capsule Take 5 mg by mouth 2 (two) times daily.    . ranolazine (RANEXA) 500 MG 12 hr tablet Take 1 tablet (500 mg total) by mouth 2 (two) times daily. 60 tablet 3  . rosuvastatin (CRESTOR) 20 MG tablet TAKE ONE TABLET BY MOUTH DAILY  90 tablet 3   No current facility-administered medications for this visit.      Past Medical History:  Diagnosis Date  . Asthma   . CHF (congestive heart failure) ([place])   . Coronary artery disease   . Diabetes mellitus    type 2  . 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 Dyslipidemia   . Hypertension   . Myocardial infarction ([place]) 2012  .  Obesity   . Tubular adenoma of colon 01/12/2014   transverse colon  . VF (ventricular fibrillation) ([place])    arrest    ROS:   All systems reviewed and negative except as noted in the HPI.   Past Surgical History:  Procedure Laterality Date  . ABDOMINAL AORTAGRAM N/A 12/14/2013   Procedure: ABDOMINAL Maxcine Ham;  Surgeon: [name], MD;  Location: [facility] CATH LAB;  Service: Cardiovascular;  Laterality: N/A;  . CARDIAC CATHETERIZATION  09/16/10  . COLONOSCOPY  01/12/2014  . CORONARY ARTERY BYPASS GRAFT  2006  . IMPLANTABLE CARDIOVERTER DEFIBRILLATOR IMPLANT  2012   STJ ICD- single chamber  . LEFT HEART CATH AND CORS/GRAFTS ANGIOGRAPHY N/A 02/19/2018   Procedure: LEFT HEART CATH AND CORS/GRAFTS ANGIOGRAPHY;  Surgeon: [name], MD;  Location: [facility] CV LAB;  Service: Cardiovascular;  Laterality: N/A;  . LOOP RECORDER IMPLANT N/A 08/24/2014   Procedure: LOOP RECORDER IMPLANT;  Surgeon: [name], MD;  Location: [facility] CATH LAB;  Service: Cardiovascular;  Laterality: N/A;  . MASS EXCISION  2008   right shoulder  . STERNECTOMY       Family History  Problem Relation Age of Onset  . Coronary artery disease Father  early 43's  . Diabetes Father   . Hypertension Father   . Breast cancer Mother 23       mets to colon and other  . Glaucoma Mother   . Prostate cancer Brother   . Heart disease Brother   . Diabetes Sister      Social History   Socioeconomic History  . Marital status: Single    Spouse name: Not on file  . Number of children: 0  . Years of education: Not on file  . Highest education level: Not on file  Occupational History  . Occupation: STORE Programme researcher, broadcasting/film/video: [employer]  Social Needs  . Financial resource strain: Not on file  . Food insecurity:    Worry: Not on file    Inability: Not on file  . Transportation needs:    Medical: Not on file    Non-medical: Not on file  Tobacco Use  . Smoking status: Never Smoker  . Smokeless  tobacco: Never Used  Substance and Sexual Activity  . Alcohol use: No  . Drug use: No  . Sexual activity: Not on file  Lifestyle  . Physical activity:    Days per week: Not on file    Minutes per session: Not on file  . Stress: Not on file  Relationships  . Social connections:    Talks on phone: Not on file    Gets together: Not on file    Attends religious service: Not on file    Active member of club or organization: Not on file    Attends meetings of clubs or organizations: Not on file    Relationship status: Not on file  . Intimate partner violence:    Fear of current or ex partner: Not on file    Emotionally abused: Not on file    Physically abused: Not on file    Forced sexual activity: Not on file  Other Topics Concern  . 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[place], [place]   6 caffeinated beverages daily     BP 122/74   Pulse 77   Ht 5\' 6"  (1.676 m)   Wt 205 lb 3.2 oz (93.1 kg)   SpO2 93%   BMI 33.12 kg/m   Physical Exam:  Well appearing 62 yo man, NAD HEENT: Unremarkable Neck:  6 cm JVD, no thyromegally Lymphatics:  No adenopathy Back:  No CVA tenderness Lungs:  Clear with no wheezes HEART:  Regular rate rhythm, no murmurs, no rubs, no clicks Abd:  soft, positive bowel sounds, no organomegally, no rebound, no guarding Ext:  2 plus pulses, no edema, no cyanosis, no clubbing Skin:  No rashes no nodules Neuro:  CN II through XII intact, motor grossly intact  EKG - none  DEVICE  Not checked today  Assess/Plan: 1. 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 I have asked him to switch the metoprolol to toprol. I have asked him to start walking 30 minutes a day, every day. I have asked him to reduce his work week to no more than 40 hours a week. 2. Dyslipidemia - he will undergo fasting lipids. If his LDL is above 75, then I will ask him to start taking a PCSK 9 inhibitor. 3. VF - he has  not had any ventricular arrhythmias.  Mikle Bosworth.D.

## 2018-03-11 NOTE — Patient Instructions (Addendum)
Medication Instructions:  Your physician has recommended you make the following change in your medication:  1.  Stop taking metoprolol tartrate 2.  Start taking metoprolol succinate (Toprol XL) 50 mg one tablet by mouth daily  Labwork: You will come to [facility] office on March 25, 2018 for FASTING lipids.  Do not eat breakfast before this blood work.  Testing/Procedures: None ordered.  Follow-Up: Your physician wants you to follow-up in: 4 months with Dr. Lovena Le.     Remote monitoring is used to monitor your ICD from home. This monitoring reduces the number of office visits required to check your device to one time per year. It allows [place] to keep an eye on the functioning of your device to ensure it is working properly. You are scheduled for a device check from home on 05/13/2018. You may send your transmission at any time that day. If you have a wireless device, the transmission will be sent automatically. After your physician reviews your transmission, you will receive a postcard with your next transmission date.  Any Other Special Instructions Will Be Listed Below (If Applicable).  Please walk/bike for 30 minutes every day.  Do NOT work more than 40 hours a week.  Avoid HIGH fat foods and sweet foods.  If you need a refill on your cardiac medications before your next appointment, please call your pharmacy.

## 2018-03-25 ENCOUNTER — Other Ambulatory Visit: Payer: BLUE CROSS/BLUE SHIELD | Admitting: *Deleted

## 2018-03-25 DIAGNOSIS — I25709 Atherosclerosis of coronary artery bypass graft(s), unspecified, with unspecified angina pectoris: Secondary | ICD-10-CM

## 2018-03-25 LAB — LIPID PANEL
CHOLESTEROL TOTAL: 138 mg/dL (ref 100–199)
Chol/HDL Ratio: 4.9 ratio (ref 0.0–5.0)
HDL: 28 mg/dL — ABNORMAL LOW (ref >39)
LDL Calculated: 76 mg/dL (ref 0–99)
Triglycerides: 170 mg/dL — ABNORMAL HIGH (ref 0–149)
VLDL Cholesterol Cal: 34 mg/dL (ref 5–40)

## 2018-03-26 ENCOUNTER — Other Ambulatory Visit: Payer: Self-pay

## 2018-03-26 DIAGNOSIS — Z79899 Other long term (current) drug therapy: Secondary | ICD-10-CM

## 2018-03-26 DIAGNOSIS — E782 Mixed hyperlipidemia: Secondary | ICD-10-CM

## 2018-03-26 MED ORDER — ROSUVASTATIN CALCIUM 40 MG PO TABS: 40.0000 mg | ORAL_TABLET | Freq: Every day | ORAL | 3 refills | Status: DC

## 2018-05-13 ENCOUNTER — Ambulatory Visit (INDEPENDENT_AMBULATORY_CARE_PROVIDER_SITE_OTHER): Payer: BLUE CROSS/BLUE SHIELD | Admitting: *Deleted

## 2018-05-13 DIAGNOSIS — I4901 Ventricular fibrillation: Secondary | ICD-10-CM

## 2018-05-13 DIAGNOSIS — I639 Cerebral infarction, unspecified: Secondary | ICD-10-CM

## 2018-05-13 NOTE — Progress Notes (Signed)
Remote ICD transmission.   

## 2018-05-15 ENCOUNTER — Encounter: Payer: Self-pay | Admitting: Cardiology

## 2018-05-26 ENCOUNTER — Other Ambulatory Visit: Payer: BLUE CROSS/BLUE SHIELD

## 2018-05-26 DIAGNOSIS — E782 Mixed hyperlipidemia: Secondary | ICD-10-CM

## 2018-05-26 DIAGNOSIS — Z79899 Other long term (current) drug therapy: Secondary | ICD-10-CM

## 2018-05-26 LAB — HEPATIC FUNCTION PANEL
ALK PHOS: 85 IU/L (ref 39–117)
ALT: 13 IU/L (ref 0–44)
AST: 16 IU/L (ref 0–40)
Albumin: 4.2 g/dL (ref 3.6–4.8)
Bilirubin Total: 0.8 mg/dL (ref 0.0–1.2)
Bilirubin, Direct: 0.18 mg/dL (ref 0.00–0.40)
Total Protein: 6.8 g/dL (ref 6.0–8.5)

## 2018-05-26 LAB — LIPID PANEL
CHOL/HDL RATIO: 5.4 ratio — AB (ref 0.0–5.0)
Cholesterol, Total: 140 mg/dL (ref 100–199)
HDL: 26 mg/dL — AB (ref >39)
LDL Calculated: 54 mg/dL (ref 0–99)
TRIGLYCERIDES: 299 mg/dL — AB (ref 0–149)
VLDL Cholesterol Cal: 60 mg/dL — ABNORMAL HIGH (ref 5–40)

## 2018-06-03 ENCOUNTER — Other Ambulatory Visit: Payer: Self-pay

## 2018-06-03 DIAGNOSIS — E782 Mixed hyperlipidemia: Secondary | ICD-10-CM

## 2018-07-14 ENCOUNTER — Encounter: Payer: Self-pay | Admitting: Internal Medicine

## 2018-07-14 ENCOUNTER — Ambulatory Visit (INDEPENDENT_AMBULATORY_CARE_PROVIDER_SITE_OTHER): Payer: BLUE CROSS/BLUE SHIELD | Admitting: Internal Medicine

## 2018-07-14 VITALS — BP 114/70 | HR 77 | Ht 66.5 in | Wt 196.8 lb

## 2018-07-14 DIAGNOSIS — R0789 Other chest pain: Secondary | ICD-10-CM

## 2018-07-14 DIAGNOSIS — I639 Cerebral infarction, unspecified: Secondary | ICD-10-CM | POA: Diagnosis not present

## 2018-07-14 DIAGNOSIS — I4901 Ventricular fibrillation: Secondary | ICD-10-CM

## 2018-07-14 DIAGNOSIS — Z95810 Presence of automatic (implantable) cardiac defibrillator: Secondary | ICD-10-CM | POA: Diagnosis not present

## 2018-07-14 NOTE — Patient Instructions (Signed)
Medication Instructions:  Your physician recommends that you continue on your current medications as directed. Please refer to the Current Medication list given to you today.  Labwork: None ordered.  Testing/Procedures: None ordered.  Follow-Up: Your physician wants you to follow-up in: one year with Dr. Lovena Le.   You will receive a reminder letter in the mail two months in advance. If you don't receive a letter, please call our office to schedule the follow-up appointment.  Remote monitoring is used to monitor your ICD from home. This monitoring reduces the number of office visits required to check your device to one time per year. It allows [place] to keep an eye on the functioning of your device to ensure it is working properly. You are scheduled for a device check from home on 08/12/2018. You may send your transmission at any time that day. If you have a wireless device, the transmission will be sent automatically. After your physician reviews your transmission, you will receive a postcard with your next transmission date.  Any Other Special Instructions Will Be Listed Below (If Applicable).  If you need a refill on your cardiac medications before your next appointment, please call your pharmacy.

## 2018-07-14 NOTE — Progress Notes (Signed)
HPI Eugene [name] returns today for followup. He is a pleasant 62 yo man with a h/o VF arrest, s/p ICD insertion who's ventricular arrhythmias have been controlled. He had chest pressure and a stress test which was abnormal and heart cath with graft disease. His EF is 42%. He has class 1 symptoms. He works a lot but is fairly sedentary. He does not smoke. Since I saw him last he has lost 10 lbs. He denies anginal symptoms since starting ranexa. No Known Allergies   Current Outpatient Medications  Medication Sig Dispense Refill  . aspirin 81 MG tablet Take 1 tablet by mouth daily.    . clopidogrel (PLAVIX) 75 MG tablet Take 75 mg by mouth daily.    . Glucosamine-Chondroit-Vit C-Mn (GLUCOSAMINE CHONDR 1500 COMPLX) CAPS Take 1 capsule by mouth 2 (two) times daily.    . Insulin Glargine (LANTUS SOLOSTAR) 100 UNIT/ML Solostar Pen Inject 70 Units into the skin daily.    . metFORMIN (GLUCOPHAGE) 500 MG tablet Take 1,000 mg by mouth 2 (two) times daily with a meal.     . nitroGLYCERIN (NITROSTAT) 0.4 MG SL tablet Place 0.4 mg under the tongue every 5 (five) minutes as needed for chest pain.    . pantoprazole (PROTONIX) 40 MG tablet Take 40 mg by mouth daily.     . ramipril (ALTACE) 5 MG capsule Take 5 mg by mouth 2 (two) times daily.    . ranolazine (RANEXA) 500 MG 12 hr tablet Take 1 tablet (500 mg total) by mouth 2 (two) times daily. 60 tablet 3  . rosuvastatin (CRESTOR) 40 MG tablet Take 1 tablet (40 mg total) by mouth daily. 90 tablet 3  . Semaglutide,0.25 or 0.5MG /DOS, 2 MG/1.5ML SOPN Inject 1 Dose into the skin once a week.    . metoprolol succinate (TOPROL-XL) 50 MG 24 hr tablet Take 1 tablet (50 mg total) by mouth daily. Take with or immediately following a meal. 90 tablet 3   No current facility-administered medications for this visit.      Past Medical History:  Diagnosis Date  . Asthma   . CHF (congestive heart failure) ([place])   . Coronary artery disease   . Diabetes mellitus    type 2  . 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 Dyslipidemia   . Hypertension   . Myocardial infarction ([place]) 2012  . Obesity   . Tubular adenoma of colon 01/12/2014   transverse colon  . VF (ventricular fibrillation) ([place])    arrest    ROS:   All systems reviewed and negative except as noted in the HPI.   Past Surgical History:  Procedure Laterality Date  . ABDOMINAL AORTAGRAM N/A 12/14/2013   Procedure: ABDOMINAL Maxcine Ham;  Surgeon: [name], MD;  Location: [facility] CATH LAB;  Service: Cardiovascular;  Laterality: N/A;  . CARDIAC CATHETERIZATION  09/16/10  . COLONOSCOPY  01/12/2014  . CORONARY ARTERY BYPASS GRAFT  2006  . IMPLANTABLE CARDIOVERTER DEFIBRILLATOR IMPLANT  2012   STJ ICD- single chamber  . LEFT HEART CATH AND CORS/GRAFTS ANGIOGRAPHY N/A 02/19/2018   Procedure: LEFT HEART CATH AND CORS/GRAFTS ANGIOGRAPHY;  Surgeon: [name], MD;  Location: [place] CV LAB;  Service: Cardiovascular;  Laterality: N/A;  . LOOP RECORDER IMPLANT N/A 08/24/2014   Procedure: LOOP RECORDER IMPLANT;  Surgeon: [name], MD;  Location: [facility] CATH LAB;  Service: Cardiovascular;  Laterality: N/A;  . MASS EXCISION  2008   right shoulder  .  STERNECTOMY       Family History  Problem Relation Age of Onset  . Coronary artery disease Father        early 30's  . Diabetes Father   . Hypertension Father   . Breast cancer Mother 44       mets to colon and other  . Glaucoma Mother   . Prostate cancer Brother   . Heart disease Brother   . Diabetes Sister      Social History   Socioeconomic History  . Marital status: Single    Spouse name: Not on file  . Number of children: 0  . Years of education: Not on file  . Highest education level: Not on file  Occupational History  . Occupation: STORE Programme researcher, broadcasting/film/video: [employer]  Social Needs  . Financial resource strain: Not on file  . Food insecurity:    Worry: Not on file    Inability: Not on file  . Transportation  needs:    Medical: Not on file    Non-medical: Not on file  Tobacco Use  . Smoking status: Never Smoker  . Smokeless tobacco: Never Used  Substance and Sexual Activity  . Alcohol use: No  . Drug use: No  . Sexual activity: Not on file  Lifestyle  . Physical activity:    Days per week: Not on file    Minutes per session: Not on file  . Stress: Not on file  Relationships  . Social connections:    Talks on phone: Not on file    Gets together: Not on file    Attends religious service: Not on file    Active member of club or organization: Not on file    Attends meetings of clubs or organizations: Not on file    Relationship status: Not on file  . Intimate partner violence:    Fear of current or ex partner: Not on file    Emotionally abused: Not on file    Physically abused: Not on file    Forced sexual activity: Not on file  Other Topics Concern  . 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[place], [place]   6 caffeinated beverages daily     BP 114/70   Pulse 77   Ht 5' 6.5" (1.689 m)   Wt 196 lb 12.8 oz (89.3 kg)   SpO2 99%   BMI 31.29 kg/m   Physical Exam:  Well appearing NAD HEENT: Unremarkable Neck:  No JVD, no thyromegally Lymphatics:  No adenopathy Back:  No CVA tenderness Lungs:  Clear HEART:  Regular rate rhythm, no murmurs, no rubs, no clicks Abd:  soft, positive bowel sounds, no organomegally, no rebound, no guarding Ext:  2 plus pulses, no edema, no cyanosis, no clubbing Skin:  No rashes no nodules Neuro:  CN II through XII intact, motor grossly intact   DEVICE  Normal device function.  See PaceArt for details.   Assess/Plan: 1. VT/VF - he has had no recurrent episodes. He will continue his current meds. 2. ICD - His St. Jude ICD is working normally. He has over 3 years left on the battery. 3. Dyslipidemia - he is tolerating his crestor. No muscle aches. 4. CAD - he has severe disease,  not amenable for PCI. He will continue his current regimen of anti-anginals and I have asked him to lose weight and increase his  daily physical activity.  Mikle Bosworth.D.

## 2018-07-15 ENCOUNTER — Telehealth: Payer: Self-pay | Admitting: *Deleted

## 2018-07-15 NOTE — Telephone Encounter (Signed)
   [facility] Pre-operative Risk Assessment    Request for surgical clearance:  1. What type of surgery is being performed? CATARACT EXTRACTION   2. When is this surgery scheduled? 08/06/18   3. What type of clearance is required (medical clearance vs. Pharmacy clearance to hold med vs. Both)? BOTH  4. Are there any medications that need to be held prior to surgery and how long? PT IS ON ASA AND PLAVIX; PER DR. ANDREW MINCEY THEY DO NOT STOP ANY BLOOD THINNERS FOR THIS SURGERY  5. Practice name and name of physician performing surgery? [facility]; DR. Mitzi Hansen MINCEY  6. What is your office phone number [phone]    7.   What is your office fax number       [phone]  8.   Anesthesia type (None, local, MAC, general) ? IV SEDATION   Julaine Hua 07/15/2018, 3:04 PM  _________________________________________________________________   (provider comments below)

## 2018-07-15 NOTE — Telephone Encounter (Signed)
   Primary Cardiologist: Appears to be Dr. Lovena Le most recently  Chart reviewed as part of pre-operative protocol coverage. Cataract extractions are recognized in guidelines as low risk surgeries that do not typically require specific preoperative testing or holding of blood thinner therapy. Therefore, given past medical history and time since last visit, based on ACC/AHA guidelines, Eugene [name] would be at acceptable risk for the planned procedure without further cardiovascular testing.   I will route this recommendation to the requesting party via Epic fax function and remove from pre-op pool.  Please call with questions.  Charlie Pitter, PA-C 07/15/2018, 5:23 PM

## 2018-07-18 LAB — CUP PACEART REMOTE DEVICE CHECK
Date Time Interrogation Session: 20191221194807
Implantable Lead Model: 7122
Implantable Pulse Generator Implant Date: 20120214
MDC IDC LEAD IMPLANT DT: 20120214
MDC IDC LEAD LOCATION: 753860
Pulse Gen Serial Number: 819791

## 2018-08-12 ENCOUNTER — Ambulatory Visit (INDEPENDENT_AMBULATORY_CARE_PROVIDER_SITE_OTHER): Payer: BLUE CROSS/BLUE SHIELD

## 2018-08-12 DIAGNOSIS — I4901 Ventricular fibrillation: Secondary | ICD-10-CM | POA: Diagnosis not present

## 2018-08-12 DIAGNOSIS — I639 Cerebral infarction, unspecified: Secondary | ICD-10-CM

## 2018-08-13 NOTE — Progress Notes (Signed)
Remote ICD transmission.   

## 2018-08-16 LAB — CUP PACEART REMOTE DEVICE CHECK
Date Time Interrogation Session: 20200119052336
Implantable Lead Implant Date: 20120214
Implantable Lead Location: 753860
Implantable Pulse Generator Implant Date: 20120214
Pulse Gen Serial Number: 819791

## 2018-09-03 DIAGNOSIS — H2512 Age-related nuclear cataract, left eye: Secondary | ICD-10-CM | POA: Diagnosis not present

## 2018-09-03 DIAGNOSIS — H25812 Combined forms of age-related cataract, left eye: Secondary | ICD-10-CM | POA: Diagnosis not present

## 2018-09-17 ENCOUNTER — Other Ambulatory Visit: Payer: Self-pay | Admitting: Cardiology

## 2018-11-11 ENCOUNTER — Other Ambulatory Visit: Payer: Self-pay

## 2018-11-11 ENCOUNTER — Ambulatory Visit (INDEPENDENT_AMBULATORY_CARE_PROVIDER_SITE_OTHER): Payer: BLUE CROSS/BLUE SHIELD | Admitting: *Deleted

## 2018-11-11 DIAGNOSIS — Z6831 Body mass index (BMI) 31.0-31.9, adult: Secondary | ICD-10-CM | POA: Diagnosis not present

## 2018-11-11 DIAGNOSIS — I639 Cerebral infarction, unspecified: Secondary | ICD-10-CM

## 2018-11-11 DIAGNOSIS — E1165 Type 2 diabetes mellitus with hyperglycemia: Secondary | ICD-10-CM | POA: Diagnosis not present

## 2018-11-11 DIAGNOSIS — I4901 Ventricular fibrillation: Secondary | ICD-10-CM

## 2018-11-11 DIAGNOSIS — Z794 Long term (current) use of insulin: Secondary | ICD-10-CM | POA: Diagnosis not present

## 2018-11-11 LAB — CUP PACEART REMOTE DEVICE CHECK
Date Time Interrogation Session: 20200415130757
Implantable Lead Implant Date: 20120214
Implantable Lead Location: 753860
Implantable Lead Model: 7122
Implantable Pulse Generator Implant Date: 20120214
Pulse Gen Serial Number: 819791

## 2018-11-16 ENCOUNTER — Other Ambulatory Visit: Payer: Self-pay | Admitting: Cardiology

## 2018-11-16 NOTE — Telephone Encounter (Signed)
Generic Ranexa 500 mg refilled.

## 2018-11-17 NOTE — Progress Notes (Signed)
Remote ICD transmission.   

## 2018-12-19 ENCOUNTER — Other Ambulatory Visit: Payer: Self-pay | Admitting: Internal Medicine

## 2018-12-23 DIAGNOSIS — E1165 Type 2 diabetes mellitus with hyperglycemia: Secondary | ICD-10-CM | POA: Diagnosis not present

## 2018-12-23 DIAGNOSIS — L03115 Cellulitis of right lower limb: Secondary | ICD-10-CM | POA: Diagnosis not present

## 2018-12-23 DIAGNOSIS — Z6830 Body mass index (BMI) 30.0-30.9, adult: Secondary | ICD-10-CM | POA: Diagnosis not present

## 2018-12-23 DIAGNOSIS — Z794 Long term (current) use of insulin: Secondary | ICD-10-CM | POA: Diagnosis not present

## 2019-01-08 DIAGNOSIS — E113213 Type 2 diabetes mellitus with mild nonproliferative diabetic retinopathy with macular edema, bilateral: Secondary | ICD-10-CM | POA: Diagnosis not present

## 2019-01-20 DIAGNOSIS — E1165 Type 2 diabetes mellitus with hyperglycemia: Secondary | ICD-10-CM | POA: Diagnosis not present

## 2019-01-20 DIAGNOSIS — Z794 Long term (current) use of insulin: Secondary | ICD-10-CM | POA: Diagnosis not present

## 2019-02-10 ENCOUNTER — Ambulatory Visit (INDEPENDENT_AMBULATORY_CARE_PROVIDER_SITE_OTHER): Payer: BC Managed Care – PPO | Admitting: *Deleted

## 2019-02-10 DIAGNOSIS — I4901 Ventricular fibrillation: Secondary | ICD-10-CM | POA: Diagnosis not present

## 2019-02-10 LAB — CUP PACEART REMOTE DEVICE CHECK
Date Time Interrogation Session: 20200715133217
Implantable Lead Implant Date: 20120214
Implantable Lead Location: 753860
Implantable Lead Model: 7122
Implantable Pulse Generator Implant Date: 20120214
Pulse Gen Serial Number: 819791

## 2019-02-19 ENCOUNTER — Encounter: Payer: Self-pay | Admitting: Cardiology

## 2019-02-19 NOTE — Progress Notes (Signed)
Remote ICD transmission.   

## 2019-03-06 ENCOUNTER — Other Ambulatory Visit: Payer: Self-pay | Admitting: Internal Medicine

## 2019-03-06 DIAGNOSIS — E782 Mixed hyperlipidemia: Secondary | ICD-10-CM

## 2019-03-18 ENCOUNTER — Other Ambulatory Visit: Payer: Self-pay | Admitting: Cardiology

## 2019-03-23 DIAGNOSIS — I240 Acute coronary thrombosis not resulting in myocardial infarction: Secondary | ICD-10-CM | POA: Diagnosis not present

## 2019-03-23 DIAGNOSIS — I5022 Chronic systolic (congestive) heart failure: Secondary | ICD-10-CM | POA: Diagnosis not present

## 2019-03-23 DIAGNOSIS — E1165 Type 2 diabetes mellitus with hyperglycemia: Secondary | ICD-10-CM | POA: Diagnosis not present

## 2019-03-23 DIAGNOSIS — D126 Benign neoplasm of colon, unspecified: Secondary | ICD-10-CM | POA: Diagnosis not present

## 2019-04-13 DIAGNOSIS — E113213 Type 2 diabetes mellitus with mild nonproliferative diabetic retinopathy with macular edema, bilateral: Secondary | ICD-10-CM | POA: Diagnosis not present

## 2019-04-15 ENCOUNTER — Other Ambulatory Visit: Payer: Self-pay | Admitting: Cardiology

## 2019-05-05 DIAGNOSIS — I259 Chronic ischemic heart disease, unspecified: Secondary | ICD-10-CM | POA: Diagnosis not present

## 2019-05-05 DIAGNOSIS — Z6830 Body mass index (BMI) 30.0-30.9, adult: Secondary | ICD-10-CM | POA: Diagnosis not present

## 2019-05-05 DIAGNOSIS — I240 Acute coronary thrombosis not resulting in myocardial infarction: Secondary | ICD-10-CM | POA: Diagnosis not present

## 2019-05-05 DIAGNOSIS — Z23 Encounter for immunization: Secondary | ICD-10-CM | POA: Diagnosis not present

## 2019-05-05 DIAGNOSIS — E1165 Type 2 diabetes mellitus with hyperglycemia: Secondary | ICD-10-CM | POA: Diagnosis not present

## 2019-05-12 ENCOUNTER — Ambulatory Visit (INDEPENDENT_AMBULATORY_CARE_PROVIDER_SITE_OTHER): Payer: BC Managed Care – PPO | Admitting: *Deleted

## 2019-05-12 DIAGNOSIS — I4901 Ventricular fibrillation: Secondary | ICD-10-CM

## 2019-05-12 DIAGNOSIS — G459 Transient cerebral ischemic attack, unspecified: Secondary | ICD-10-CM

## 2019-05-12 LAB — CUP PACEART REMOTE DEVICE CHECK
Date Time Interrogation Session: 20201014141510
Implantable Lead Implant Date: 20120214
Implantable Lead Location: 753860
Implantable Lead Model: 7122
Implantable Pulse Generator Implant Date: 20120214
Pulse Gen Serial Number: 819791

## 2019-05-24 NOTE — Progress Notes (Signed)
Remote ICD transmission.   

## 2019-06-04 ENCOUNTER — Other Ambulatory Visit: Payer: Self-pay | Admitting: Internal Medicine

## 2019-06-04 DIAGNOSIS — E782 Mixed hyperlipidemia: Secondary | ICD-10-CM

## 2019-07-08 DIAGNOSIS — E1121 Type 2 diabetes mellitus with diabetic nephropathy: Secondary | ICD-10-CM | POA: Diagnosis not present

## 2019-07-08 DIAGNOSIS — K219 Gastro-esophageal reflux disease without esophagitis: Secondary | ICD-10-CM | POA: Diagnosis not present

## 2019-07-08 DIAGNOSIS — I240 Acute coronary thrombosis not resulting in myocardial infarction: Secondary | ICD-10-CM | POA: Diagnosis not present

## 2019-07-08 DIAGNOSIS — E1165 Type 2 diabetes mellitus with hyperglycemia: Secondary | ICD-10-CM | POA: Diagnosis not present

## 2019-07-13 DIAGNOSIS — E113213 Type 2 diabetes mellitus with mild nonproliferative diabetic retinopathy with macular edema, bilateral: Secondary | ICD-10-CM | POA: Diagnosis not present

## 2019-07-26 ENCOUNTER — Encounter: Payer: Self-pay | Admitting: *Deleted

## 2019-08-05 ENCOUNTER — Other Ambulatory Visit: Payer: Self-pay

## 2019-08-05 ENCOUNTER — Encounter: Payer: Self-pay | Admitting: Internal Medicine

## 2019-08-05 ENCOUNTER — Encounter (INDEPENDENT_AMBULATORY_CARE_PROVIDER_SITE_OTHER): Payer: Self-pay

## 2019-08-05 ENCOUNTER — Ambulatory Visit (INDEPENDENT_AMBULATORY_CARE_PROVIDER_SITE_OTHER): Payer: BC Managed Care – PPO | Admitting: Internal Medicine

## 2019-08-05 VITALS — BP 106/58 | HR 75 | Ht 65.5 in | Wt 186.4 lb

## 2019-08-05 DIAGNOSIS — I25119 Atherosclerotic heart disease of native coronary artery with unspecified angina pectoris: Secondary | ICD-10-CM

## 2019-08-05 DIAGNOSIS — I4901 Ventricular fibrillation: Secondary | ICD-10-CM

## 2019-08-05 DIAGNOSIS — Z95810 Presence of automatic (implantable) cardiac defibrillator: Secondary | ICD-10-CM | POA: Diagnosis not present

## 2019-08-05 NOTE — Progress Notes (Signed)
HPI [name] returns today for followup. The patient is  a 64 yo man with a h/o prior VF arrest, CAD, S/p ICD insertion. He has lost 25 lbs in the interim. He has class 2 CHF symptoms. He denies palpitations, syncope, chest pain or sob.   No Known Allergies   Current Outpatient Medications  Medication Sig Dispense Refill  . aspirin 81 MG tablet Take 1 tablet by mouth daily.    . clopidogrel (PLAVIX) 75 MG tablet Take 75 mg by mouth daily.    . Glucosamine-Chondroit-Vit C-Mn (GLUCOSAMINE CHONDR 1500 COMPLX) CAPS Take 1 capsule by mouth 2 (two) times daily.    . Insulin Glargine (LANTUS SOLOSTAR) 100 UNIT/ML Solostar Pen Inject 70 Units into the skin daily.    . metFORMIN (GLUCOPHAGE) 500 MG tablet Take 1,000 mg by mouth 2 (two) times daily with a meal.     . metoprolol succinate (TOPROL-XL) 50 MG 24 hr tablet TAKE ONE TABLET BY MOUTH ONE TIME DAILY.TAKE WITH OR IMMEDIATELY FOLLOWING A MEAL 90 tablet 2  . nitroGLYCERIN (NITROSTAT) 0.4 MG SL tablet Place 0.4 mg under the tongue every 5 (five) minutes as needed for chest pain.    . pantoprazole (PROTONIX) 40 MG tablet Take 40 mg by mouth daily.     . ramipril (ALTACE) 5 MG capsule Take 5 mg by mouth 2 (two) times daily.    . ranolazine (RANEXA) 500 MG 12 hr tablet TAKE ONE TABLET BY MOUTH TWICE DAILY.PLEASE MAKE APPT WITH DR.HARDING FOR REFILLS.  60 tablet 2  . rosuvastatin (CRESTOR) 40 MG tablet Take 1 tablet (40 mg total) by mouth daily. Please continue with appt for future refills 90 tablet 0  . Semaglutide,0.25 or 0.5MG /DOS, 2 MG/1.5ML SOPN Inject 1 Dose into the skin once a week.     No current facility-administered medications for this visit.     Past Medical History:  Diagnosis Date  . Abnormal nuclear stress test 02/19/2018  . Angina, class III ([place]) 02/19/2018  . Anxiety   . Asthma   . CHF (congestive heart failure) ([place])   . Coronary artery disease   . DDD (degenerative disc disease), cervical 03/01/2013  . Diabetes  mellitus    type 2  . 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 Dyslipidemia   . Hypertension   . Lumbosacral radiculopathy at L4 02/18/2017  . Myocardial infarction ([place]) 2012  . Obesity   . Sleep apnea 08/17/2013  . Snoring 10/12/2013  . TIA (transient ischemic attack) 08/09/2014  . Tubular adenoma of colon 01/12/2014   transverse colon  . VF (ventricular fibrillation) ([place])    arrest  . Vitamin D deficiency 02/18/2017    ROS:   All systems reviewed and negative except as noted in the HPI.   Past Surgical History:  Procedure Laterality Date  . ABDOMINAL AORTAGRAM N/A 12/14/2013   Procedure: ABDOMINAL Maxcine Ham;  Surgeon: [name], MD;  Location: [facility] CATH LAB;  Service: Cardiovascular;  Laterality: N/A;  . CARDIAC CATHETERIZATION  09/16/10  . COLONOSCOPY  01/12/2014  . CORONARY ARTERY BYPASS GRAFT  2006  . IMPLANTABLE CARDIOVERTER DEFIBRILLATOR IMPLANT  2012   STJ ICD- single chamber  . LEFT HEART CATH AND CORS/GRAFTS ANGIOGRAPHY N/A 02/19/2018   Procedure: LEFT HEART CATH AND CORS/GRAFTS ANGIOGRAPHY;  Surgeon: [name], MD;  Location: [place] CV LAB;  Service: Cardiovascular;  Laterality: N/A;  . LOOP RECORDER IMPLANT N/A 08/24/2014   Procedure: LOOP RECORDER IMPLANT;  Surgeon: [name], MD;  Location: [facility] CATH LAB;  Service: Cardiovascular;  Laterality: N/A;  . MASS EXCISION  2008   right shoulder  . STERNECTOMY       Family History  Problem Relation Age of Onset  . Coronary artery disease Father        early 47's  . Diabetes Father   . Hypertension Father   . Breast cancer Mother 69       mets to colon and other  . Glaucoma Mother   . Prostate cancer Brother   . Heart disease Brother   . Diabetes Sister      Social History   Socioeconomic History  . Marital status: Single    Spouse name: Not on file  . Number of children: 0  . Years of education: Not on file  . Highest education level: Not on file  Occupational History  . Occupation:  STORE Programme researcher, broadcasting/film/video: [employer]  Tobacco Use  . Smoking status: Never Smoker  . Smokeless tobacco: Never Used  Substance and Sexual Activity  . Alcohol use: No  . Drug use: No  . Sexual activity: Not on file  Other Topics Concern  . 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 Difficulty of Paying Living Expenses: Not on file  Food Insecurity:   . Worried About Charity fundraiser in the Last Year: Not on file  . Ran Out of Food in the Last Year: Not on file  Transportation Needs:   . Lack of Transportation (Medical): Not on file  . Lack of Transportation (Non-Medical): Not on file  Physical Activity:   . Days of Exercise per Week: Not on file  . Minutes of Exercise per Session: Not on file  Stress:   . Feeling of Stress : Not on file  Social Connections:   . Frequency of Communication with Friends and Family: Not on file  . Frequency of Social Gatherings with Friends and Family: Not on file  . Attends Religious Services: Not on file  . Active Member of Clubs or Organizations: Not on file  . Attends Archivist Meetings: Not on file  . Marital Status: Not on file  Intimate Partner Violence:   . Fear of Current or Ex-Partner: Not on file  . Emotionally Abused: Not on file  . Physically Abused: Not on file  . Sexually Abused: Not on file     BP (!) 106/58   Pulse 75   Ht 5' 5.5" (1.664 m)   Wt 186 lb 6.4 oz (84.6 kg)   SpO2 98%   BMI 30.55 kg/m   Physical Exam:  Well appearing 64 yo man, NAD HEENT: Unremarkable Neck:  6 cm JVD, no thyromegally Lymphatics:  No adenopathy Back:  No CVA tenderness Lungs:  Clear with no wheezes HEART:  Regular rate rhythm, no murmurs, no rubs, no clicks Abd:  soft, positive bowel sounds, no organomegally, no rebound, no guarding Ext:  2 plus  pulses, no edema, no cyanosis, no clubbing Skin:  No rashes no nodules Neuro:  CN II through XII intact, motor grossly intact  EKG - NSR with RBBB and first degree AV block  DEVICE  Normal device function.  See PaceArt for details.  Assess/Plan: 1. Chronic systolic heart failure - his symptoms are well controlled.  2. VF arrest - he has had no recurrent symptoms. No ICD shocks. He will continue his current meds. 3. ICD - his single chamber device is working normally. He has almost 3 years of battery longevity.  Mikle Bosworth.D.

## 2019-08-05 NOTE — Patient Instructions (Signed)
Medication Instructions:  Your physician recommends that you continue on your current medications as directed. Please refer to the Current Medication list given to you today.  Labwork: None ordered.  Testing/Procedures: None ordered.  Follow-Up: Your physician wants you to follow-up in: one year with Dr. Lovena Le.   You will receive a reminder letter in the mail two months in advance. If you don't receive a letter, please call our office to schedule the follow-up appointment.  Remote monitoring is used to monitor your ICD from home. This monitoring reduces the number of office visits required to check your device to one time per year. It allows [place] to keep an eye on the functioning of your device to ensure it is working properly. You are scheduled for a device check from home on 08/11/2019. You may send your transmission at any time that day. If you have a wireless device, the transmission will be sent automatically. After your physician reviews your transmission, you will receive a postcard with your next transmission date.  Any Other Special Instructions Will Be Listed Below (If Applicable).  If you need a refill on your cardiac medications before your next appointment, please call your pharmacy.

## 2019-08-11 ENCOUNTER — Ambulatory Visit (INDEPENDENT_AMBULATORY_CARE_PROVIDER_SITE_OTHER): Payer: BC Managed Care – PPO | Admitting: *Deleted

## 2019-08-11 DIAGNOSIS — I4901 Ventricular fibrillation: Secondary | ICD-10-CM | POA: Diagnosis not present

## 2019-08-11 LAB — CUP PACEART REMOTE DEVICE CHECK
Date Time Interrogation Session: 20210113065444
Implantable Lead Implant Date: 20120214
Implantable Lead Location: 753860
Implantable Lead Model: 7122
Implantable Pulse Generator Implant Date: 20120214
Pulse Gen Serial Number: 819791

## 2019-09-04 ENCOUNTER — Other Ambulatory Visit: Payer: Self-pay | Admitting: Internal Medicine

## 2019-09-04 DIAGNOSIS — E782 Mixed hyperlipidemia: Secondary | ICD-10-CM

## 2019-10-07 DIAGNOSIS — I739 Peripheral vascular disease, unspecified: Secondary | ICD-10-CM | POA: Diagnosis not present

## 2019-10-07 DIAGNOSIS — E1121 Type 2 diabetes mellitus with diabetic nephropathy: Secondary | ICD-10-CM | POA: Diagnosis not present

## 2019-10-07 DIAGNOSIS — Z794 Long term (current) use of insulin: Secondary | ICD-10-CM | POA: Diagnosis not present

## 2019-10-07 DIAGNOSIS — E1159 Type 2 diabetes mellitus with other circulatory complications: Secondary | ICD-10-CM | POA: Diagnosis not present

## 2019-10-07 DIAGNOSIS — N1832 Chronic kidney disease, stage 3b: Secondary | ICD-10-CM | POA: Diagnosis not present

## 2019-10-07 DIAGNOSIS — M5417 Radiculopathy, lumbosacral region: Secondary | ICD-10-CM | POA: Diagnosis not present

## 2019-11-08 DIAGNOSIS — N1832 Chronic kidney disease, stage 3b: Secondary | ICD-10-CM | POA: Diagnosis not present

## 2019-11-08 DIAGNOSIS — E1159 Type 2 diabetes mellitus with other circulatory complications: Secondary | ICD-10-CM | POA: Diagnosis not present

## 2019-11-08 DIAGNOSIS — R0600 Dyspnea, unspecified: Secondary | ICD-10-CM | POA: Diagnosis not present

## 2019-11-08 DIAGNOSIS — Z794 Long term (current) use of insulin: Secondary | ICD-10-CM | POA: Diagnosis not present

## 2019-11-08 DIAGNOSIS — E1121 Type 2 diabetes mellitus with diabetic nephropathy: Secondary | ICD-10-CM | POA: Diagnosis not present

## 2019-11-08 DIAGNOSIS — I240 Acute coronary thrombosis not resulting in myocardial infarction: Secondary | ICD-10-CM | POA: Diagnosis not present

## 2019-11-10 ENCOUNTER — Ambulatory Visit (INDEPENDENT_AMBULATORY_CARE_PROVIDER_SITE_OTHER): Payer: BC Managed Care – PPO | Admitting: *Deleted

## 2019-11-10 DIAGNOSIS — I4901 Ventricular fibrillation: Secondary | ICD-10-CM | POA: Diagnosis not present

## 2019-11-10 LAB — CUP PACEART REMOTE DEVICE CHECK
Battery Remaining Longevity: 32 mo
Battery Remaining Percentage: 29 %
Battery Voltage: 2.86 V
Brady Statistic RV Percent Paced: 1 %
Date Time Interrogation Session: 20210414025715
HighPow Impedance: 69 Ohm
HighPow Impedance: 69 Ohm
Implantable Lead Implant Date: 20120214
Implantable Lead Location: 753860
Implantable Lead Model: 7122
Implantable Pulse Generator Implant Date: 20120214
Lead Channel Impedance Value: 450 Ohm
Lead Channel Pacing Threshold Amplitude: 0.75 V
Lead Channel Pacing Threshold Pulse Width: 0.5 ms
Lead Channel Sensing Intrinsic Amplitude: 11.7 mV
Lead Channel Setting Pacing Amplitude: 2.5 V
Lead Channel Setting Pacing Pulse Width: 0.5 ms
Lead Channel Setting Sensing Sensitivity: 0.5 mV
Pulse Gen Serial Number: 819791

## 2019-11-10 NOTE — Progress Notes (Signed)
ICD Remote  

## 2019-11-23 DIAGNOSIS — E1159 Type 2 diabetes mellitus with other circulatory complications: Secondary | ICD-10-CM | POA: Diagnosis not present

## 2019-12-09 DIAGNOSIS — N289 Disorder of kidney and ureter, unspecified: Secondary | ICD-10-CM | POA: Diagnosis not present

## 2019-12-09 DIAGNOSIS — R7989 Other specified abnormal findings of blood chemistry: Secondary | ICD-10-CM | POA: Diagnosis not present

## 2019-12-09 DIAGNOSIS — E1159 Type 2 diabetes mellitus with other circulatory complications: Secondary | ICD-10-CM | POA: Diagnosis not present

## 2019-12-09 DIAGNOSIS — E1121 Type 2 diabetes mellitus with diabetic nephropathy: Secondary | ICD-10-CM | POA: Diagnosis not present

## 2019-12-14 ENCOUNTER — Other Ambulatory Visit: Payer: Self-pay | Admitting: Internal Medicine

## 2019-12-16 DIAGNOSIS — M5441 Lumbago with sciatica, right side: Secondary | ICD-10-CM | POA: Diagnosis not present

## 2019-12-16 DIAGNOSIS — I739 Peripheral vascular disease, unspecified: Secondary | ICD-10-CM | POA: Diagnosis not present

## 2019-12-16 DIAGNOSIS — I25709 Atherosclerosis of coronary artery bypass graft(s), unspecified, with unspecified angina pectoris: Secondary | ICD-10-CM | POA: Diagnosis not present

## 2019-12-16 DIAGNOSIS — E1121 Type 2 diabetes mellitus with diabetic nephropathy: Secondary | ICD-10-CM | POA: Diagnosis not present

## 2019-12-17 DIAGNOSIS — E1159 Type 2 diabetes mellitus with other circulatory complications: Secondary | ICD-10-CM | POA: Diagnosis not present

## 2019-12-17 DIAGNOSIS — I739 Peripheral vascular disease, unspecified: Secondary | ICD-10-CM | POA: Diagnosis not present

## 2019-12-17 DIAGNOSIS — R7989 Other specified abnormal findings of blood chemistry: Secondary | ICD-10-CM | POA: Diagnosis not present

## 2019-12-17 DIAGNOSIS — E1121 Type 2 diabetes mellitus with diabetic nephropathy: Secondary | ICD-10-CM | POA: Diagnosis not present

## 2019-12-23 DIAGNOSIS — M5441 Lumbago with sciatica, right side: Secondary | ICD-10-CM | POA: Diagnosis not present

## 2019-12-23 DIAGNOSIS — M9903 Segmental and somatic dysfunction of lumbar region: Secondary | ICD-10-CM | POA: Diagnosis not present

## 2019-12-28 DIAGNOSIS — M5441 Lumbago with sciatica, right side: Secondary | ICD-10-CM | POA: Diagnosis not present

## 2019-12-28 DIAGNOSIS — M9903 Segmental and somatic dysfunction of lumbar region: Secondary | ICD-10-CM | POA: Diagnosis not present

## 2019-12-30 DIAGNOSIS — M5441 Lumbago with sciatica, right side: Secondary | ICD-10-CM | POA: Diagnosis not present

## 2019-12-30 DIAGNOSIS — M9903 Segmental and somatic dysfunction of lumbar region: Secondary | ICD-10-CM | POA: Diagnosis not present

## 2020-01-03 DIAGNOSIS — M9903 Segmental and somatic dysfunction of lumbar region: Secondary | ICD-10-CM | POA: Diagnosis not present

## 2020-01-03 DIAGNOSIS — M5441 Lumbago with sciatica, right side: Secondary | ICD-10-CM | POA: Diagnosis not present

## 2020-01-06 DIAGNOSIS — M9903 Segmental and somatic dysfunction of lumbar region: Secondary | ICD-10-CM | POA: Diagnosis not present

## 2020-01-06 DIAGNOSIS — M5441 Lumbago with sciatica, right side: Secondary | ICD-10-CM | POA: Diagnosis not present

## 2020-01-11 DIAGNOSIS — M9903 Segmental and somatic dysfunction of lumbar region: Secondary | ICD-10-CM | POA: Diagnosis not present

## 2020-01-11 DIAGNOSIS — M5441 Lumbago with sciatica, right side: Secondary | ICD-10-CM | POA: Diagnosis not present

## 2020-01-13 DIAGNOSIS — M5441 Lumbago with sciatica, right side: Secondary | ICD-10-CM | POA: Diagnosis not present

## 2020-01-13 DIAGNOSIS — M9903 Segmental and somatic dysfunction of lumbar region: Secondary | ICD-10-CM | POA: Diagnosis not present

## 2020-01-20 DIAGNOSIS — M5441 Lumbago with sciatica, right side: Secondary | ICD-10-CM | POA: Diagnosis not present

## 2020-01-20 DIAGNOSIS — M9903 Segmental and somatic dysfunction of lumbar region: Secondary | ICD-10-CM | POA: Diagnosis not present

## 2020-01-27 DIAGNOSIS — M9903 Segmental and somatic dysfunction of lumbar region: Secondary | ICD-10-CM | POA: Diagnosis not present

## 2020-01-27 DIAGNOSIS — M5441 Lumbago with sciatica, right side: Secondary | ICD-10-CM | POA: Diagnosis not present

## 2020-02-09 ENCOUNTER — Ambulatory Visit (INDEPENDENT_AMBULATORY_CARE_PROVIDER_SITE_OTHER): Payer: BC Managed Care – PPO | Admitting: *Deleted

## 2020-02-09 DIAGNOSIS — I4901 Ventricular fibrillation: Secondary | ICD-10-CM | POA: Diagnosis not present

## 2020-02-09 LAB — CUP PACEART REMOTE DEVICE CHECK
Battery Remaining Longevity: 31 mo
Battery Remaining Percentage: 27 %
Battery Voltage: 2.84 V
Brady Statistic RV Percent Paced: 1 %
Date Time Interrogation Session: 20210714032924
HighPow Impedance: 59 Ohm
HighPow Impedance: 59 Ohm
Implantable Lead Implant Date: 20120214
Implantable Lead Location: 753860
Implantable Lead Model: 7122
Implantable Pulse Generator Implant Date: 20120214
Lead Channel Impedance Value: 480 Ohm
Lead Channel Pacing Threshold Amplitude: 0.75 V
Lead Channel Pacing Threshold Pulse Width: 0.5 ms
Lead Channel Sensing Intrinsic Amplitude: 11.7 mV
Lead Channel Setting Pacing Amplitude: 2.5 V
Lead Channel Setting Pacing Pulse Width: 0.5 ms
Lead Channel Setting Sensing Sensitivity: 0.5 mV
Pulse Gen Serial Number: 819791

## 2020-02-10 NOTE — Progress Notes (Signed)
Remote ICD transmission.   

## 2020-02-17 DIAGNOSIS — M5441 Lumbago with sciatica, right side: Secondary | ICD-10-CM | POA: Diagnosis not present

## 2020-02-17 DIAGNOSIS — M9903 Segmental and somatic dysfunction of lumbar region: Secondary | ICD-10-CM | POA: Diagnosis not present

## 2020-04-10 DIAGNOSIS — E7849 Other hyperlipidemia: Secondary | ICD-10-CM | POA: Diagnosis not present

## 2020-04-10 DIAGNOSIS — E559 Vitamin D deficiency, unspecified: Secondary | ICD-10-CM | POA: Diagnosis not present

## 2020-04-10 DIAGNOSIS — N184 Chronic kidney disease, stage 4 (severe): Secondary | ICD-10-CM | POA: Diagnosis not present

## 2020-04-10 DIAGNOSIS — E1159 Type 2 diabetes mellitus with other circulatory complications: Secondary | ICD-10-CM | POA: Diagnosis not present

## 2020-05-10 ENCOUNTER — Ambulatory Visit (INDEPENDENT_AMBULATORY_CARE_PROVIDER_SITE_OTHER): Payer: BC Managed Care – PPO

## 2020-05-10 DIAGNOSIS — I4901 Ventricular fibrillation: Secondary | ICD-10-CM | POA: Diagnosis not present

## 2020-05-12 LAB — CUP PACEART REMOTE DEVICE CHECK
Battery Remaining Longevity: 30 mo
Battery Remaining Percentage: 27 %
Battery Voltage: 2.83 V
Brady Statistic RV Percent Paced: 1 %
Date Time Interrogation Session: 20211013020413
HighPow Impedance: 68 Ohm
HighPow Impedance: 68 Ohm
Implantable Lead Implant Date: 20120214
Implantable Lead Location: 753860
Implantable Lead Model: 7122
Implantable Pulse Generator Implant Date: 20120214
Lead Channel Impedance Value: 450 Ohm
Lead Channel Pacing Threshold Amplitude: 0.75 V
Lead Channel Pacing Threshold Pulse Width: 0.5 ms
Lead Channel Sensing Intrinsic Amplitude: 11.7 mV
Lead Channel Setting Pacing Amplitude: 2.5 V
Lead Channel Setting Pacing Pulse Width: 0.5 ms
Lead Channel Setting Sensing Sensitivity: 0.5 mV
Pulse Gen Serial Number: 819791

## 2020-05-15 NOTE — Progress Notes (Signed)
Remote ICD transmission.   

## 2020-06-01 DIAGNOSIS — Z23 Encounter for immunization: Secondary | ICD-10-CM | POA: Diagnosis not present

## 2020-06-01 DIAGNOSIS — I129 Hypertensive chronic kidney disease with stage 1 through stage 4 chronic kidney disease, or unspecified chronic kidney disease: Secondary | ICD-10-CM | POA: Diagnosis not present

## 2020-06-01 DIAGNOSIS — E1159 Type 2 diabetes mellitus with other circulatory complications: Secondary | ICD-10-CM | POA: Diagnosis not present

## 2020-06-10 ENCOUNTER — Other Ambulatory Visit: Payer: Self-pay | Admitting: Internal Medicine

## 2020-06-27 ENCOUNTER — Telehealth: Payer: Self-pay | Admitting: *Deleted

## 2020-06-27 ENCOUNTER — Encounter: Payer: Self-pay | Admitting: Gastroenterology

## 2020-06-27 ENCOUNTER — Ambulatory Visit (INDEPENDENT_AMBULATORY_CARE_PROVIDER_SITE_OTHER): Payer: BC Managed Care – PPO | Admitting: Gastroenterology

## 2020-06-27 VITALS — BP 130/70 | HR 81 | Ht 66.0 in | Wt 174.0 lb

## 2020-06-27 DIAGNOSIS — Z86010 Personal history of colonic polyps: Secondary | ICD-10-CM | POA: Diagnosis not present

## 2020-06-27 DIAGNOSIS — R1110 Vomiting, unspecified: Secondary | ICD-10-CM

## 2020-06-27 DIAGNOSIS — R634 Abnormal weight loss: Secondary | ICD-10-CM | POA: Diagnosis not present

## 2020-06-27 DIAGNOSIS — Z8639 Personal history of other endocrine, nutritional and metabolic disease: Secondary | ICD-10-CM | POA: Insufficient documentation

## 2020-06-27 DIAGNOSIS — K5900 Constipation, unspecified: Secondary | ICD-10-CM | POA: Diagnosis not present

## 2020-06-27 NOTE — Telephone Encounter (Signed)
Dr. Lovena [name] can pt with hx of CAD s/p CABG and ICD post arrest, v fib hold his plavix for 5 days for EGD colonoscopy?  Please send to pre-op pool

## 2020-06-27 NOTE — Telephone Encounter (Signed)
[facility] Pre-operative Risk Assessment     Request for surgical clearance:     Endoscopy Procedure  What type of surgery is being performed?     EGD/colonoscopy  When is this surgery scheduled?     Thursday 07/13/20  What type of clearance is required ?   Pharmacy  Are there any medications that need to be held prior to surgery and how long? Plavix 5 days  Practice name and name of physician performing surgery?      [facility]  What is your office phone and fax number?      Phone- [phone]  [phone]  Anesthesia type (None, local, MAC, general) ?       MAC

## 2020-06-27 NOTE — Telephone Encounter (Signed)
Per Dr. Lovena [name]- "Yes may hold" GT   Will forward to preop pool.

## 2020-06-27 NOTE — Patient Instructions (Addendum)
If you are age 65 or older, your body mass index should be between 23-30. Your Body mass index is 28.08 kg/m. If this is out of the aforementioned range listed, please consider follow up with your Primary Care Provider.  If you are age 15 or younger, your body mass index should be between 19-25. Your Body mass index is 28.08 kg/m. If this is out of the aformentioned range listed, please consider follow up with your Primary Care Provider.   Continue Miralax daily.  You have been scheduled for an endoscopy and colonoscopy. Please follow the written instructions given to you at your visit today. Please pick up your prep supplies at the pharmacy within the next 1-3 days. If you use inhalers (even only as needed), please bring them with you on the day of your procedure.  Due to recent changes in healthcare laws, you may see the results of your imaging and laboratory studies on MyChart before your provider has had a chance to review them.  We understand that in some cases there may be results that are confusing or concerning to you. Not all laboratory results come back in the same time frame and the provider may be waiting for multiple results in order to interpret others.  Please give [place] 48 hours in order for your provider to thoroughly review all the results before contacting the office for clarification of your results.

## 2020-06-27 NOTE — Progress Notes (Signed)
06/27/2020 Eugene [name] [id number] September 19, 1955   HISTORY OF PRESENT ILLNESS: This is a 64 year old male who is a patient of Dr. [name]. He is known to him for colonoscopy in October 2015 at which time he had a couple of larger adenomatous polyps removed. One was over 10 mm in size so it was recommended he have a repeat colonoscopy at a 3-year interval, in October 2018, but he has not yet done so. He is here today with complaints of weight loss and constipation. He tells me the constipation has been an issue over the past 2 to 3 months. He did start MiraLAX daily and has been doing much better with that. He admits that he has lost about 40 pounds over the past year, maybe a little bit longer. He also reports intermittent episodes of vomiting that occur once a week to every other week. He says he cannot identify anything that triggers them. He says that it does not seem to occur when he eats a larger meal or certain foods, etc. He is on daily PPI therapy and says that that works well for him in regards to those symptoms. He denies abdominal pain and rectal bleeding.  He is insulin-dependent diabetic and has been on insulin for about the past 20 years around the time he was diagnosed. He has history of coronary artery disease and is on Plavix and has a defibrillator in place. He follows with Dr. Lovena [name].   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/12/14   mild   Dyslipidemia    Hypertension    Lumbosacral radiculopathy at L4 02/18/2017   Myocardial infarction [facility]) 2012   Obesity    Sleep apnea 08/17/2013   Snoring 10/12/2013   TIA (transient ischemic attack) 08/09/2014   Tubular adenoma of colon  01/12/2014   transverse colon   VF (ventricular fibrillation) ([place])    arrest   Vitamin D deficiency 02/18/2017   Past Surgical History:  Procedure Laterality Date   ABDOMINAL AORTAGRAM N/A 12/14/2013   Procedure: ABDOMINAL Maxcine Ham;  Surgeon: [name], MD;  Location: [facility] CATH LAB;  Service: Cardiovascular;  Laterality: N/A;   CARDIAC CATHETERIZATION  09/16/10   COLONOSCOPY  01/12/2014   CORONARY ARTERY BYPASS GRAFT  2006   IMPLANTABLE CARDIOVERTER DEFIBRILLATOR IMPLANT  2012   STJ ICD- single chamber   LEFT HEART CATH AND CORS/GRAFTS ANGIOGRAPHY N/A 02/19/2018   Procedure: LEFT HEART CATH AND CORS/GRAFTS ANGIOGRAPHY;  Surgeon: [name], MD;  Location: [place] CV LAB;  Service: Cardiovascular;  Laterality: N/A;   LOOP RECORDER IMPLANT N/A 08/24/2014   Procedure: LOOP RECORDER IMPLANT;  Surgeon: [name], MD;  Location: [facility] CATH LAB;  Service: Cardiovascular;  Laterality: N/A;   MASS EXCISION  2008   right shoulder   STERNECTOMY      reports that he has never smoked. He has never used smokeless tobacco. He reports that he does not drink alcohol and does not use drugs. family history includes Breast cancer (age of onset: 36) in his mother; Coronary artery disease in his father; Diabetes in his father and sister; Glaucoma in his mother; Heart disease in  his brother; Hypertension in his father; Prostate cancer in his brother. No Known Allergies    Outpatient Encounter Medications as of 06/27/2020  Medication Sig   aspirin 81 MG tablet Take 1 tablet by mouth daily.   clopidogrel (PLAVIX) 75 MG tablet Take 75 mg by mouth daily.   Glucosamine-Chondroit-Vit C-Mn (GLUCOSAMINE CHONDR 1500 COMPLX) CAPS Take 1 capsule by mouth 2 (two) times daily.   Insulin Glargine (LANTUS SOLOSTAR) 100 UNIT/ML Solostar Pen Inject 70 Units into the skin daily.   metFORMIN (GLUCOPHAGE) 500 MG tablet Take 1,000 mg by mouth 2 (two) times daily with a meal.    metoprolol  succinate (TOPROL-XL) 50 MG 24 hr tablet TAKE ONE TABLET BY MOUTH ONE TIME DAILY. TAKE WITH OR IMMEDIATELY FOLLOWING A MEAL   nitroGLYCERIN (NITROSTAT) 0.4 MG SL tablet Place 0.4 mg under the tongue every 5 (five) minutes as needed for chest pain.   pantoprazole (PROTONIX) 40 MG tablet Take 40 mg by mouth daily.    ranolazine (RANEXA) 500 MG 12 hr tablet TAKE ONE TABLET BY MOUTH TWICE DAILY.PLEASE MAKE APPT WITH DR.HARDING FOR REFILLS.    rosuvastatin (CRESTOR) 40 MG tablet Take 1 tablet (40 mg total) by mouth daily.   Semaglutide,0.25 or 0.5MG /DOS, 2 MG/1.5ML SOPN Inject 1 Dose into the skin once a week.   [DISCONTINUED] ramipril (ALTACE) 5 MG capsule Take 5 mg by mouth 2 (two) times daily.   No facility-administered encounter medications on file as of 06/27/2020.     REVIEW OF SYSTEMS  : All other systems reviewed and negative except where noted in the History of Present Illness.   PHYSICAL EXAM: BP 130/70    Pulse 81    Ht 5\' 6"  (1.676 m)    Wt 174 lb (78.9 kg)    BMI 28.08 kg/m  General: Well developed white male in no acute distress Head: Normocephalic and atraumatic Eyes:  Sclerae anicteric, conjunctiva pink. Ears: Normal auditory acuity Lungs: Clear throughout to auscultation; no W/R/R. Heart: Regular rate and rhythm; no M/R/G. Abdomen: Soft, non-distended.  BS present.  Non-tender.  Diastasis recti noted. Rectal:  Will be done at the time of colonoscopy. Musculoskeletal: Symmetrical with no gross deformities  Skin: No lesions on visible extremities Extremities: No edema  Neurological: Alert oriented x 4, grossly non-focal Psychological:  Alert and cooperative. Normal mood and affect  ASSESSMENT AND PLAN: *Constipation:  New over the past few months, but much improved with Miralax daily.  Will continue. *Weight loss: Reports about 40 pound weight loss over the past year or more. *Personal history of large adenomatous polyps removed in 2015: Was due for repeat  colonoscopy October 2018. *Episodes of vomiting: These occur randomly about once every week to every other week. He cannot make any specific associations as to when this happens. Does not seem to happen when he has eaten a large amount, certain foods, etc. *History of CAD on Plavix:  Hold Plavix for 5 days before procedure - will instruct when and how to resume after procedure. Risks and benefits of procedure including bleeding, perforation, infection, missed lesions, medication reactions and possible hospitalization or surgery if complications occur explained. Additional rare but real risk of cardiovascular event such as heart attack or ischemia/infarct of other organs off of Plavix explained and need to seek urgent help if this occurs. Will communicate by phone or EMR with patient's prescribing provider, Dr. Lovena [name], to confirm that holding Plavix is reasonable in this case.  *IDDM:  Insulin will be adjusted prior  to endoscopic procedure per protocol. Will resume normal dosing after procedure.  **We will plan for both EGD and colonoscopy with Dr. Carlean Purl.  The risks, benefits, and alternatives to EGD and colonoscopy were discussed with the patient and he consents to proceed.  **May need pan CT scan if EGD and colonoscopy are unremarkable.  CC:  Raelene Bott, MD

## 2020-06-27 NOTE — Telephone Encounter (Signed)
I sent instructions to GI for plavix.  I tried to touch base with pt to let him know and check if he had any issues - GI did not ask for that   But with ICD I like to check.

## 2020-06-28 NOTE — Telephone Encounter (Signed)
Agree with above 

## 2020-07-03 NOTE — Telephone Encounter (Signed)
Called patient again as follow-up to Laura's call. Patient reports he has had chest pain and shortness of breath since last visit in 07/2019 (he denied any chest pain or shortness of breath at this visit). Symptoms started a few months ago. Chest pain occurs both at rest and with exertion but he states it is "definitely" associated with activity. He has severe known CAD s/p CABG. Last cath was in 01/2018 and optimization of medical therapy was recommended. He was started on Ranexa. Colonoscopy is usually low risk procedure but given new chest pain and shortness of breath since last visit, I think patient should be re-evaluated especially since it has been almost 1 year since we last saw him.   Pre-op covering staff, can you please see if we can get him a visit prior to procedure on 07/14/2020? (Since patient is followed by Dr. Lovena Le, will also route message to Gracy Bruins to see if she can help with this).  I will also route this note to Caryl Asp, CMA at [facility], who sent initial clearance form to make them aware.  Darreld Mclean, PA-C 07/03/2020 1:13 PM

## 2020-07-03 NOTE — Telephone Encounter (Signed)
Appt schedule with Eugene [name] on 12/15

## 2020-07-03 NOTE — Telephone Encounter (Signed)
Need appt with Dr Lovena Le or App for pre op clearance

## 2020-07-03 NOTE — Telephone Encounter (Signed)
Send to scheduler to schedule appt

## 2020-07-04 NOTE — H&P (View-Only) (Signed)
Cardiology Office Note Date:  07/04/2020  Patient ID:  Eugene [name], [place] [birth year], MRN [id number] PCP:  [name], MD  Electrophysiologist: Dr. Lovena Le    Chief Complaint: pre-op, CP  History of Present Illness: Eugene [name] is a 64 y.o. 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 Lovena Le, last seen by him Jan 2021, described class II HF symptoms, no CP, no changes were made.  He comes today to be seen for Dr. Lovena Le, requiring clearance for EGD/colonoscopy, plavix to be held 5 days.  In effort for completeness, given his hx, pt was called by the preop pool and mentioned that he had been having some CP, SOB new since his visit with Dr. Lovena Le in January and recommended he be seen prior to his planned procedure.   He tells me that he is getting the colonoscopy 2/2 40lb unintentional weight loss over the year and reduced appetite. He since his cath back in 2019 has had some occasional CP, though in the last 3-6 months has noticed increased frequency/severity of his CP.  He states that he has associated SOB with it, is central, pressure that can be very heavy, is non-radiating, no N/V, no diaphoresais. It can occur with minimal exertion some times and will resolve with resting alone, often will require s/l NTG, and occasionally will require 2 doses of NTG to resolve He denies any rest symptoms, no rest SOB, no symptoms of orthopnea or PND He has never been woke with CP   He has not had any CP today, or having any active CP now, he may have had some yesterday, can't remember but if he did for sure did not have to take any NTG yesterday.   He tells me for some years, he has chronic b/l hand numbness and b/l LE numbness from knees down, denies symptoms of claudication. The numbness is not new or different.    Device  information SJM single chamber ICD implanted 09/11/2010 ILR implanted 2016 2/2 cryptogenic stroke, EOS 12/21/2017  Past Medical History:  Diagnosis Date  . Abnormal nuclear stress test 02/19/2018  . Angina, class III ([place]) 02/19/2018  . Anxiety   . Asthma   . CHF (congestive heart failure) ([place])   . Coronary artery disease   . DDD (degenerative disc disease), cervical 03/01/2013  . Diabetes mellitus    type 2  . 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 Dyslipidemia   . Hypertension   . Lumbosacral radiculopathy at L4 02/18/2017  . Myocardial infarction ([place]) 2012  . Obesity   . Sleep apnea 08/17/2013  . Snoring 10/12/2013  . TIA (transient ischemic attack) 08/09/2014  . Tubular adenoma of colon 01/12/2014   transverse colon  . VF (ventricular fibrillation) ([place])    arrest  . Vitamin D deficiency 02/18/2017    Past Surgical History:  Procedure Laterality Date  . ABDOMINAL AORTAGRAM N/A 12/14/2013   Procedure: ABDOMINAL Maxcine Ham;  Surgeon: [name], MD;  Location: [facility] CATH LAB;  Service: Cardiovascular;  Laterality: N/A;  . CARDIAC CATHETERIZATION  09/16/10  . COLONOSCOPY  01/12/2014  . CORONARY ARTERY BYPASS GRAFT  2006  . IMPLANTABLE CARDIOVERTER DEFIBRILLATOR IMPLANT  2012   STJ ICD- single chamber  . LEFT HEART CATH AND CORS/GRAFTS ANGIOGRAPHY N/A 02/19/2018   Procedure: LEFT HEART CATH AND CORS/GRAFTS  ANGIOGRAPHY;  Surgeon: [name], MD;  Location: [place] CV LAB;  Service: Cardiovascular;  Laterality: N/A;  . LOOP RECORDER IMPLANT N/A 08/24/2014   Procedure: LOOP RECORDER IMPLANT;  Surgeon: [name], MD;  Location: [facility] CATH LAB;  Service: Cardiovascular;  Laterality: N/A;  . MASS EXCISION  2008   right shoulder  . STERNECTOMY      Current Outpatient Medications  Medication Sig Dispense Refill  . aspirin 81 MG tablet Take 1 tablet by mouth daily.    . clopidogrel (PLAVIX) 75 MG tablet Take 75 mg by mouth daily.    . Glucosamine-Chondroit-Vit C-Mn  (GLUCOSAMINE CHONDR 1500 COMPLX) CAPS Take 1 capsule by mouth 2 (two) times daily.    . Insulin Glargine (LANTUS SOLOSTAR) 100 UNIT/ML Solostar Pen Inject 70 Units into the skin daily.    . metFORMIN (GLUCOPHAGE) 500 MG tablet Take 1,000 mg by mouth 2 (two) times daily with a meal.     . metoprolol succinate (TOPROL-XL) 50 MG 24 hr tablet TAKE ONE TABLET BY MOUTH ONE TIME DAILY. TAKE WITH OR IMMEDIATELY FOLLOWING A MEAL 90 tablet 1  . nitroGLYCERIN (NITROSTAT) 0.4 MG SL tablet Place 0.4 mg under the tongue every 5 (five) minutes as needed for chest pain.    . pantoprazole (PROTONIX) 40 MG tablet Take 40 mg by mouth daily.     . ranolazine (RANEXA) 500 MG 12 hr tablet TAKE ONE TABLET BY MOUTH TWICE DAILY.PLEASE MAKE APPT WITH DR.HARDING FOR REFILLS.  60 tablet 2  . rosuvastatin (CRESTOR) 40 MG tablet Take 1 tablet (40 mg total) by mouth daily. 90 tablet 3  . Semaglutide,0.25 or 0.5MG /DOS, 2 MG/1.5ML SOPN Inject 1 Dose into the skin once a week.     No current facility-administered medications for this visit.    Allergies:   Patient has no known allergies.   Social History:  The patient  reports that he has never smoked. He has never used smokeless tobacco. He reports that he does not drink alcohol and does not use drugs.   Family History:  The patient's family history includes Breast cancer (age of onset: 9) in his mother; Coronary artery disease in his father; Diabetes in his father and sister; Glaucoma in his mother; Heart disease in his brother; Hypertension in his father; Prostate cancer in his brother.  ROS:  Please see the history of present illness.    All other systems are reviewed and otherwise negative.   PHYSICAL EXAM:  VS:  There were no vitals taken for this visit. BMI: There is no height or weight on file to calculate BMI. Well nourished, well developed, in no acute distress HEENT: normocephalic, atraumatic Neck: no JVD, carotid bruits or masses Cardiac:  RRR; no significant  murmurs, no rubs, or gallops Equal/palpable radial pulses, unable to palpate pedal pulses, feet are warm, good cap refill, + fem pulses Lungs:  CTA b/l, no wheezing, rhonchi or rales Abd: soft, nontender MS: no deformity or atrophy Ext: no edema Skin: warm and dry, no rash Neuro:  No gross deficits appreciated Psych: euthymic mood, full affect  ICD and loop site are stable, no tethering or discomfort   EKG:  Done today and reviewed by myself shows  SR, net ST depressions I, V1-6, some ST elevation aVR  Device interrogation done today and reviewed by myself:  Battery and lea measurements are good No arrhythmias   02/19/2018; LHC  Ost LAD to Prox LAD lesion is 100% stenosed. Prox LAD lesion  is 99% stenosed.  Ost Ramus lesion is 100% stenosed.  Ost Cx to Prox Cx lesion is 80% stenosed. Mid Cx lesion is 100% stenosed. 1st Mrg lesion is 99% stenosed.  Prox RCA to Dist RCA lesion is 100% stenosed with 100% stenosed side branch in Post Atrio.  GRAFTS: 2 OF 4 PATENT  LIMA-LAD graft was visualized by angiography and is large. The graft exhibits no disease. The distal LAD is very small caliber and diffusely diseased. Mid LAD lesion is 80% stenosed.  SVG-RI graft was visualized by angiography and is normal in caliber. Prox Graft to Dist Graft lesion is 100% stenosed. There is persistent contrast staining from retrograde filling after injecting the OM graft. Not likely salvageable  SVG-dCx graft was visualized by angiography and is large. Prox Graft lesion is 45% stenosed. SVG graft was visualized by angiography and is normal in caliber.  SVG-RCA was visualized by angiography and is normal in caliber. Origin to Prox Graft lesion is 100% stenosed.   1. Severe Native CAD with CTO of pRCA, Ost LAD, RI & dCx wtih diffuse Cx-AVGCx disease. 2. Patent LIMA-dLAD (distal LAD has severe diffuse disease & is small caliber - NOT PCI amenable 3. Patent SVG-dCx with ~40% focal lesion in graft;  small-moderate dCx with ~2 OM branches 4. CTO of SVG-RCA (known) wtih NEW CTO SVG-RI. 5. Preserved LVEF with normal LVEDP.  Plan: Patient will return to short stay holding area for TR band removal.  Will discharge home today with plan to optimize medical management. I will add Ranexa 500 mg twice daily for antianginal benefit given his borderline blood pressures and continue his current medications.  Recommend Aspirin 81mg  daily for severe CAD   Recent Labs: No results found for requested labs within last 8760 hours.  No results found for requested labs within last 8760 hours.   CrCl cannot be calculated (Patient's most recent lab result is older than the maximum 21 days allowed.).   Wt Readings from Last 3 Encounters:  06/27/20 174 lb (78.9 kg)  08/05/19 186 lb 6.4 oz (84.6 kg)  07/14/18 196 lb 12.8 oz (89.3 kg)     Other studies reviewed: Additional studies/records reviewed today include: summarized above  ASSESSMENT AND PLAN:  1. ICD     Intact function, no changes made  2. VF arrest hx     No syncope, no arrhythmias or therapies  3. Chronic CHF (reported as systolic)     Last EF noted via cath report 2019, "preserved"     No symptoms or exam findings to suggest volume OL     Needs an echo  4. HTN     Relative hypotension     Note at his cath in 2019, chose ranexa given his BP not felt to tolerate others  5. CAD 6. CP, escalating anginal symptoms 7. Abnormal EKG Reviewed case, EG with Dr. Burt Knack, DOD Dr., Burt Knack spoke with the patient, discussed his last cath, escalating symptoms in the last several months, and his EKG findings today Recommends cardiac cath ASAP, did not feel given no active symptoms and what thept described as predictable CP that he need to go to the ER today, though will need to get cath ASAP Plan labs including troponin today  Should he have escalation in symptoms or CP that does not resolve > ER/911 if needed If Trop comes back abnormal,  will refer him to the ER  We discussed cath, he is familiar with the procedure Discussed potential risks including  vascular injury, heart attack, stroke, death, and benefits, he is agreeable  to proceed. His significant other is with him today also agreeable  He has chronic hand and LE numbness (no claudication), + radial pulses/fem pulses, pedal pulses unable to feel ,though are warm w/good cap refill 2015 had Abdominal Aortogram and run off, he says symptoms ar enot new or different for him.   Disposition: F/u with Dr. Lovena Le as scheduled, sooner if needed.  Current medicines are reviewed at length with the patient today.  The patient did not have any concerns regarding medicines.  Venetia Night, PA-C 07/04/2020 5:15 PM     [address] [place] [address] [phone] (office)  [phone] (fax)

## 2020-07-04 NOTE — Progress Notes (Signed)
Cardiology Office Note Date:  07/04/2020  Patient ID:  Eugene [name] 1956/06/19, MRN [id number] PCP:  [name], MD  Electrophysiologist: Dr. Lovena Le    Chief Complaint: pre-op, CP  History of Present Illness: Eugene [name] is a 64 y.o. 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 Lovena Le, last seen by him Jan 2021, described class II HF symptoms, no CP, no changes were made.  He comes today to be seen for Dr. Lovena Le, requiring clearance for EGD/colonoscopy, plavix to be held 5 days.  In effort for completeness, given his hx, pt was called by the preop pool and mentioned that he had been having some CP, SOB new since his visit with Dr. Lovena Le in January and recommended he be seen prior to his planned procedure.   He tells me that he is getting the colonoscopy 2/2 40lb unintentional weight loss over the year and reduced appetite. He since his cath back in 2019 has had some occasional CP, though in the last 3-6 months has noticed increased frequency/severity of his CP.  He states that he has associated SOB with it, is central, pressure that can be very heavy, is non-radiating, no N/V, no diaphoresais. It can occur with minimal exertion some times and will resolve with resting alone, often will require s/l NTG, and occasionally will require 2 doses of NTG to resolve He denies any rest symptoms, no rest SOB, no symptoms of orthopnea or PND He has never been woke with CP   He has not had any CP today, or having any active CP now, he may have had some yesterday, can't remember but if he did for sure did not have to take any NTG yesterday.   He tells me for some years, he has chronic b/l hand numbness and b/l LE numbness from knees down, denies symptoms of claudication. The numbness is not new or different.    Device  information SJM single chamber ICD implanted 09/11/2010 ILR implanted 2016 2/2 cryptogenic stroke, EOS 12/21/2017  Past Medical History:  Diagnosis Date  . Abnormal nuclear stress test 02/19/2018  . Angina, class III ([place]) 02/19/2018  . Anxiety   . Asthma   . CHF (congestive heart failure) ([place])   . Coronary artery disease   . DDD (degenerative disc disease), cervical 03/01/2013  . Diabetes mellitus    type 2  . 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 Dyslipidemia   . Hypertension   . Lumbosacral radiculopathy at L4 02/18/2017  . Myocardial infarction ([place]) 2012  . Obesity   . Sleep apnea 08/17/2013  . Snoring 10/12/2013  . TIA (transient ischemic attack) 08/09/2014  . Tubular adenoma of colon 01/12/2014   transverse colon  . VF (ventricular fibrillation) ([place])    arrest  . Vitamin D deficiency 02/18/2017    Past Surgical History:  Procedure Laterality Date  . ABDOMINAL AORTAGRAM N/A 12/14/2013   Procedure: ABDOMINAL Maxcine Ham;  Surgeon: [name], MD;  Location: [facility] CATH LAB;  Service: Cardiovascular;  Laterality: N/A;  . CARDIAC CATHETERIZATION  09/16/10  . COLONOSCOPY  01/12/2014  . CORONARY ARTERY BYPASS GRAFT  2006  . IMPLANTABLE CARDIOVERTER DEFIBRILLATOR IMPLANT  2012   STJ ICD- single chamber  . LEFT HEART CATH AND CORS/GRAFTS ANGIOGRAPHY N/A 02/19/2018   Procedure: LEFT HEART CATH AND CORS/GRAFTS  ANGIOGRAPHY;  Surgeon: [name], MD;  Location: [facility] CV LAB;  Service: Cardiovascular;  Laterality: N/A;  . LOOP RECORDER IMPLANT N/A 08/24/2014   Procedure: LOOP RECORDER IMPLANT;  Surgeon: [name], MD;  Location: [facility] CATH LAB;  Service: Cardiovascular;  Laterality: N/A;  . MASS EXCISION  2008   right shoulder  . STERNECTOMY      Current Outpatient Medications  Medication Sig Dispense Refill  . aspirin 81 MG tablet Take 1 tablet by mouth daily.    . clopidogrel (PLAVIX) 75 MG tablet Take 75 mg by mouth daily.    . Glucosamine-Chondroit-Vit C-Mn  (GLUCOSAMINE CHONDR 1500 COMPLX) CAPS Take 1 capsule by mouth 2 (two) times daily.    . Insulin Glargine (LANTUS SOLOSTAR) 100 UNIT/ML Solostar Pen Inject 70 Units into the skin daily.    . metFORMIN (GLUCOPHAGE) 500 MG tablet Take 1,000 mg by mouth 2 (two) times daily with a meal.     . metoprolol succinate (TOPROL-XL) 50 MG 24 hr tablet TAKE ONE TABLET BY MOUTH ONE TIME DAILY. TAKE WITH OR IMMEDIATELY FOLLOWING A MEAL 90 tablet 1  . nitroGLYCERIN (NITROSTAT) 0.4 MG SL tablet Place 0.4 mg under the tongue every 5 (five) minutes as needed for chest pain.    . pantoprazole (PROTONIX) 40 MG tablet Take 40 mg by mouth daily.     . ranolazine (RANEXA) 500 MG 12 hr tablet TAKE ONE TABLET BY MOUTH TWICE DAILY.PLEASE MAKE APPT WITH DR.HARDING FOR REFILLS.  60 tablet 2  . rosuvastatin (CRESTOR) 40 MG tablet Take 1 tablet (40 mg total) by mouth daily. 90 tablet 3  . Semaglutide,0.25 or 0.5MG /DOS, 2 MG/1.5ML SOPN Inject 1 Dose into the skin once a week.     No current facility-administered medications for this visit.    Allergies:   Patient has no known allergies.   Social History:  The patient  reports that he has never smoked. He has never used smokeless tobacco. He reports that he does not drink alcohol and does not use drugs.   Family History:  The patient's family history includes Breast cancer (age of onset: 57) in his mother; Coronary artery disease in his father; Diabetes in his father and sister; Glaucoma in his mother; Heart disease in his brother; Hypertension in his father; Prostate cancer in his brother.  ROS:  Please see the history of present illness.    All other systems are reviewed and otherwise negative.   PHYSICAL EXAM:  VS:  There were no vitals taken for this visit. BMI: There is no height or weight on file to calculate BMI. Well nourished, well developed, in no acute distress HEENT: normocephalic, atraumatic Neck: no JVD, carotid bruits or masses Cardiac:  RRR; no significant  murmurs, no rubs, or gallops Equal/palpable radial pulses, unable to palpate pedal pulses, feet are warm, good cap refill, + fem pulses Lungs:  CTA b/l, no wheezing, rhonchi or rales Abd: soft, nontender MS: no deformity or atrophy Ext: no edema Skin: warm and dry, no rash Neuro:  No gross deficits appreciated Psych: euthymic mood, full affect  ICD and loop site are stable, no tethering or discomfort   EKG:  Done today and reviewed by myself shows  SR, net ST depressions I, V1-6, some ST elevation aVR  Device interrogation done today and reviewed by myself:  Battery and lea measurements are good No arrhythmias   02/19/2018; LHC  Ost LAD to Prox LAD lesion is 100% stenosed. Prox LAD lesion  is 99% stenosed.  Ost Ramus lesion is 100% stenosed.  Ost Cx to Prox Cx lesion is 80% stenosed. Mid Cx lesion is 100% stenosed. 1st Mrg lesion is 99% stenosed.  Prox RCA to Dist RCA lesion is 100% stenosed with 100% stenosed side branch in Post Atrio.  GRAFTS: 2 OF 4 PATENT  LIMA-LAD graft was visualized by angiography and is large. The graft exhibits no disease. The distal LAD is very small caliber and diffusely diseased. Mid LAD lesion is 80% stenosed.  SVG-RI graft was visualized by angiography and is normal in caliber. Prox Graft to Dist Graft lesion is 100% stenosed. There is persistent contrast staining from retrograde filling after injecting the OM graft. Not likely salvageable  SVG-dCx graft was visualized by angiography and is large. Prox Graft lesion is 45% stenosed. SVG graft was visualized by angiography and is normal in caliber.  SVG-RCA was visualized by angiography and is normal in caliber. Origin to Prox Graft lesion is 100% stenosed.   1. Severe Native CAD with CTO of pRCA, Ost LAD, RI & dCx wtih diffuse Cx-AVGCx disease. 2. Patent LIMA-dLAD (distal LAD has severe diffuse disease & is small caliber - NOT PCI amenable 3. Patent SVG-dCx with ~40% focal lesion in graft;  small-moderate dCx with ~2 OM branches 4. CTO of SVG-RCA (known) wtih NEW CTO SVG-RI. 5. Preserved LVEF with normal LVEDP.  Plan: Patient will return to short stay holding area for TR band removal.  Will discharge home today with plan to optimize medical management. I will add Ranexa 500 mg twice daily for antianginal benefit given his borderline blood pressures and continue his current medications.  Recommend Aspirin 81mg  daily for severe CAD   Recent Labs: No results found for requested labs within last 8760 hours.  No results found for requested labs within last 8760 hours.   CrCl cannot be calculated (Patient's most recent lab result is older than the maximum 21 days allowed.).   Wt Readings from Last 3 Encounters:  06/27/20 174 lb (78.9 kg)  08/05/19 186 lb 6.4 oz (84.6 kg)  07/14/18 196 lb 12.8 oz (89.3 kg)     Other studies reviewed: Additional studies/records reviewed today include: summarized above  ASSESSMENT AND PLAN:  1. ICD     Intact function, no changes made  2. VF arrest hx     No syncope, no arrhythmias or therapies  3. Chronic CHF (reported as systolic)     Last EF noted via cath report 2019, "preserved"     No symptoms or exam findings to suggest volume OL     Needs an echo  4. HTN     Relative hypotension     Note at his cath in 2019, chose ranexa given his BP not felt to tolerate others  5. CAD 6. CP, escalating anginal symptoms 7. Abnormal EKG Reviewed case, EG with Dr. Burt Knack, DOD Dr., Burt Knack spoke with the patient, discussed his last cath, escalating symptoms in the last several months, and his EKG findings today Recommends cardiac cath ASAP, did not feel given no active symptoms and what thept described as predictable CP that he need to go to the ER today, though will need to get cath ASAP Plan labs including troponin today  Should he have escalation in symptoms or CP that does not resolve > ER/911 if needed If Trop comes back abnormal,  will refer him to the ER  We discussed cath, he is familiar with the procedure Discussed potential risks including  vascular injury, heart attack, stroke, death, and benefits, he is agreeable  to proceed. His significant other is with him today also agreeable  He has chronic hand and LE numbness (no claudication), + radial pulses/fem pulses, pedal pulses unable to feel ,though are warm w/good cap refill 2015 had Abdominal Aortogram and run off, he says symptoms ar enot new or different for him.   Disposition: F/u with Dr. Lovena Le as scheduled, sooner if needed.  Current medicines are reviewed at length with the patient today.  The patient did not have any concerns regarding medicines.  Venetia Night, PA-C 07/04/2020 5:15 PM     [address] [id number] [phone] (office)  [phone] (fax)

## 2020-07-05 ENCOUNTER — Encounter: Payer: Self-pay | Admitting: *Deleted

## 2020-07-05 ENCOUNTER — Encounter: Payer: Self-pay | Admitting: Physician Assistant

## 2020-07-05 ENCOUNTER — Other Ambulatory Visit: Payer: Self-pay

## 2020-07-05 ENCOUNTER — Telehealth: Payer: Self-pay | Admitting: Physician Assistant

## 2020-07-05 ENCOUNTER — Encounter (HOSPITAL_COMMUNITY): Payer: Self-pay | Admitting: *Deleted

## 2020-07-05 ENCOUNTER — Other Ambulatory Visit (HOSPITAL_COMMUNITY)
Admission: RE | Admit: 2020-07-05 | Discharge: 2020-07-05 | Disposition: A | Discharge status: Home / Self Care | Payer: BC Managed Care – PPO | Source: Ambulatory Visit | Attending: Internal Medicine | Admitting: Internal Medicine

## 2020-07-05 ENCOUNTER — Ambulatory Visit: Payer: Medicare HMO | Admitting: Physician Assistant

## 2020-07-05 ENCOUNTER — Emergency Department (HOSPITAL_COMMUNITY)
Admission: EM | Admit: 2020-07-05 | Discharge: 2020-07-06 | Disposition: A | Discharge status: Home / Self Care | Payer: BC Managed Care – PPO | Source: Home / Self Care

## 2020-07-05 VITALS — BP 96/64 | HR 76 | Ht 67.0 in | Wt 170.8 lb

## 2020-07-05 DIAGNOSIS — R799 Abnormal finding of blood chemistry, unspecified: Secondary | ICD-10-CM | POA: Insufficient documentation

## 2020-07-05 DIAGNOSIS — Z794 Long term (current) use of insulin: Secondary | ICD-10-CM | POA: Diagnosis not present

## 2020-07-05 DIAGNOSIS — R079 Chest pain, unspecified: Secondary | ICD-10-CM | POA: Diagnosis not present

## 2020-07-05 DIAGNOSIS — I509 Heart failure, unspecified: Secondary | ICD-10-CM | POA: Diagnosis not present

## 2020-07-05 DIAGNOSIS — I4901 Ventricular fibrillation: Secondary | ICD-10-CM | POA: Diagnosis not present

## 2020-07-05 DIAGNOSIS — I25119 Atherosclerotic heart disease of native coronary artery with unspecified angina pectoris: Secondary | ICD-10-CM | POA: Diagnosis not present

## 2020-07-05 DIAGNOSIS — Z95810 Presence of automatic (implantable) cardiac defibrillator: Secondary | ICD-10-CM

## 2020-07-05 DIAGNOSIS — Z20822 Contact with and (suspected) exposure to covid-19: Secondary | ICD-10-CM | POA: Diagnosis not present

## 2020-07-05 DIAGNOSIS — I110 Hypertensive heart disease with heart failure: Secondary | ICD-10-CM | POA: Diagnosis not present

## 2020-07-05 DIAGNOSIS — Z5321 Procedure and treatment not carried out due to patient leaving prior to being seen by health care provider: Secondary | ICD-10-CM | POA: Insufficient documentation

## 2020-07-05 DIAGNOSIS — J45909 Unspecified asthma, uncomplicated: Secondary | ICD-10-CM | POA: Diagnosis not present

## 2020-07-05 DIAGNOSIS — Z8673 Personal history of transient ischemic attack (TIA), and cerebral infarction without residual deficits: Secondary | ICD-10-CM | POA: Diagnosis not present

## 2020-07-05 DIAGNOSIS — Z01812 Encounter for preprocedural laboratory examination: Secondary | ICD-10-CM | POA: Diagnosis not present

## 2020-07-05 DIAGNOSIS — E119 Type 2 diabetes mellitus without complications: Secondary | ICD-10-CM | POA: Diagnosis not present

## 2020-07-05 DIAGNOSIS — Z79899 Other long term (current) drug therapy: Secondary | ICD-10-CM | POA: Diagnosis not present

## 2020-07-05 LAB — BASIC METABOLIC PANEL
BUN/Creatinine Ratio: 14 (ref 10–24)
BUN: 34 mg/dL — ABNORMAL HIGH (ref 8–27)
CO2: 20 mmol/L (ref 20–29)
Calcium: 9.3 mg/dL (ref 8.6–10.2)
Chloride: 102 mmol/L (ref 96–106)
Creatinine, Ser: 2.49 mg/dL — ABNORMAL HIGH (ref 0.76–1.27)
GFR calc Af Amer: 30 mL/min/{1.73_m2} — ABNORMAL LOW (ref >59)
GFR calc non Af Amer: 26 mL/min/{1.73_m2} — ABNORMAL LOW (ref >59)
Glucose: 199 mg/dL — ABNORMAL HIGH (ref 65–99)
Potassium: 4.7 mmol/L (ref 3.5–5.2)
Sodium: 137 mmol/L (ref 134–144)

## 2020-07-05 LAB — CBC
Hematocrit: 37.8 % (ref 37.5–51.0)
Hemoglobin: 12.6 g/dL — ABNORMAL LOW (ref 13.0–17.7)
MCH: 28.4 pg (ref 26.6–33.0)
MCHC: 33.3 g/dL (ref 31.5–35.7)
MCV: 85 fL (ref 79–97)
Platelets: 226 10*3/uL (ref 150–450)
RBC: 4.43 x10E6/uL (ref 4.14–5.80)
RDW: 13.7 % (ref 11.6–15.4)
WBC: 11.6 10*3/uL — ABNORMAL HIGH (ref 3.4–10.8)

## 2020-07-05 LAB — TROPONIN T: Troponin T (Highly Sensitive): 198 ng/L (ref 0–22)

## 2020-07-05 LAB — SARS CORONAVIRUS 2 (TAT 6-24 HRS): SARS Coronavirus 2: NEGATIVE

## 2020-07-05 NOTE — Telephone Encounter (Signed)
Checked on the patient's labs.   His troponin alone came back earlier today and noted to be elevated, in review with Dr. Burt Knack, with this alone, did not initially think he needed to go to the ER.  Though the rest of his labs came back now and note his BUN/Creat 34/2.49 (in further digging into his chart/care everywhere, seems he has developed escalating Creat this year and has been followed by his PMD, having a renal artery angio in May)  Dr. Saunders Revel reviewed labs, and felt he should go to the ER for further evaluation and w/u, management and admission, likey will need hydration and see how his labs look tomorrow prior to contrast and cath.  I called and spoke to the patient, he confirmed his identity by name and birth date. He was not having any active CP but did have what he called a small amount this afternoon that did not require NTG. I informed hi of his labs and recommendation to go ahead to the ER and why.  he was agreeable and stated that he would go, instructed him to let them know in the ER that he was seen today and they will be able to see my notes for review.  Tommye Standard, PA-C

## 2020-07-05 NOTE — ED Triage Notes (Signed)
Pt is scheduled for a cardiac cath in the morning, had pre-op labs done this morning showing bun 34 and creat 2.49. Denies any cp, sob. Reports hx of stage 4 kidney disease.

## 2020-07-05 NOTE — Patient Instructions (Addendum)
  Medication Instructions:   Your physician recommends that you continue on your current medications as directed. Please refer to the Current Medication list given to you today.  *If you need a refill on your cardiac medications before your next appointment, please call your pharmacy*   Lab Work:  BMET  CBC AND TROPONIN TODAY   COVID SCREENING( DIRECTIONS/MAP GIVEN ) DATE : 07-05-20  TIME: 11:30A   If you have labs (blood work) drawn today and your tests are completely normal, you will receive your results only by: Marland Kitchen MyChart Message (if you have MyChart) OR . A paper copy in the mail If you have any lab test that is abnormal or we need to change your treatment, we will call you to review the results.   Testing/Procedures: Your physician has requested that you have an echocardiogram. Echocardiography is a painless test that uses sound waves to create images of your heart. It provides your doctor with information about the size and shape of your heart and how well your heart's chambers and valves are working. This procedure takes approximately one hour. There are no restrictions for this procedure.  SEE LETTER FOR Your physician  requested cardiac catheterization. Cardiac catheterization is used to diagnose and/or treat various heart conditions. Doctors may recommend this procedure for a number of different reasons. The most common reason is to evaluate chest pain. Chest pain can be a symptom of coronary artery disease (CAD), and cardiac catheterization can show whether plaque is narrowing or blocking your heart's arteries. This procedure is also used to evaluate the valves, as well as measure the blood flow and oxygen levels in different parts of your heart. For further information please visit [email or web address]. Please follow instruction sheet, as given.   Follow-Up: At [facility], you and your health needs are our priority.  As part of our continuing mission to provide you with  exceptional heart care, we have created designated Provider Care Teams.  These Care Teams include your primary Cardiologist (physician) and Advanced Practice Providers (APPs -  Physician Assistants and Nurse Practitioners) who all work together to provide you with the care you need, when you need it.  We recommend signing up for the patient portal called "MyChart".  Sign up information is provided on this After Visit Summary.  MyChart is used to connect with patients for Virtual Visits (Telemedicine).  Patients are able to view lab/test results, encounter notes, upcoming appointments, etc.  Non-urgent messages can be sent to your provider as well.   To learn more about what you can do with MyChart, go to [email or web address].    Your next appointment:   3 week(s)  The format for your next appointment:   In Person  Provider:   Cristopher Peru, MD   Other Instructions

## 2020-07-06 ENCOUNTER — Encounter (HOSPITAL_COMMUNITY): Payer: Self-pay | Admitting: Internal Medicine

## 2020-07-06 ENCOUNTER — Other Ambulatory Visit: Payer: Self-pay

## 2020-07-06 ENCOUNTER — Ambulatory Visit (HOSPITAL_COMMUNITY): Admission: RE | Admit: 2020-07-06 | Payer: Medicare HMO | Source: Home / Self Care | Admitting: Internal Medicine

## 2020-07-06 ENCOUNTER — Ambulatory Visit (HOSPITAL_BASED_OUTPATIENT_CLINIC_OR_DEPARTMENT_OTHER): Payer: BC Managed Care – PPO

## 2020-07-06 ENCOUNTER — Encounter (HOSPITAL_COMMUNITY)
Admission: AD | Disposition: A | Discharge status: Home / Self Care | Payer: Self-pay | Source: Ambulatory Visit | Attending: Internal Medicine

## 2020-07-06 ENCOUNTER — Observation Stay (HOSPITAL_COMMUNITY)
Admission: AD | Admit: 2020-07-06 | Discharge: 2020-07-07 | Disposition: A | Discharge status: Home / Self Care | Payer: BC Managed Care – PPO | Source: Ambulatory Visit | Attending: Internal Medicine | Admitting: Internal Medicine

## 2020-07-06 DIAGNOSIS — Z794 Long term (current) use of insulin: Secondary | ICD-10-CM | POA: Diagnosis not present

## 2020-07-06 DIAGNOSIS — E119 Type 2 diabetes mellitus without complications: Secondary | ICD-10-CM | POA: Diagnosis not present

## 2020-07-06 DIAGNOSIS — Z8673 Personal history of transient ischemic attack (TIA), and cerebral infarction without residual deficits: Secondary | ICD-10-CM | POA: Insufficient documentation

## 2020-07-06 DIAGNOSIS — I509 Heart failure, unspecified: Secondary | ICD-10-CM | POA: Insufficient documentation

## 2020-07-06 DIAGNOSIS — I200 Unstable angina: Secondary | ICD-10-CM | POA: Diagnosis present

## 2020-07-06 DIAGNOSIS — I25119 Atherosclerotic heart disease of native coronary artery with unspecified angina pectoris: Principal | ICD-10-CM | POA: Insufficient documentation

## 2020-07-06 DIAGNOSIS — I10 Essential (primary) hypertension: Secondary | ICD-10-CM | POA: Diagnosis present

## 2020-07-06 DIAGNOSIS — I25110 Atherosclerotic heart disease of native coronary artery with unstable angina pectoris: Secondary | ICD-10-CM | POA: Diagnosis not present

## 2020-07-06 DIAGNOSIS — I25710 Atherosclerosis of autologous vein coronary artery bypass graft(s) with unstable angina pectoris: Secondary | ICD-10-CM

## 2020-07-06 DIAGNOSIS — J45909 Unspecified asthma, uncomplicated: Secondary | ICD-10-CM | POA: Insufficient documentation

## 2020-07-06 DIAGNOSIS — I5022 Chronic systolic (congestive) heart failure: Secondary | ICD-10-CM

## 2020-07-06 DIAGNOSIS — E785 Hyperlipidemia, unspecified: Secondary | ICD-10-CM | POA: Diagnosis present

## 2020-07-06 DIAGNOSIS — Z79899 Other long term (current) drug therapy: Secondary | ICD-10-CM | POA: Insufficient documentation

## 2020-07-06 DIAGNOSIS — Z955 Presence of coronary angioplasty implant and graft: Secondary | ICD-10-CM

## 2020-07-06 DIAGNOSIS — I110 Hypertensive heart disease with heart failure: Secondary | ICD-10-CM | POA: Insufficient documentation

## 2020-07-06 HISTORY — PX: LEFT HEART CATH AND CORS/GRAFTS ANGIOGRAPHY: CATH118250

## 2020-07-06 HISTORY — PX: CORONARY STENT INTERVENTION: CATH118234

## 2020-07-06 LAB — GLUCOSE, CAPILLARY
Glucose-Capillary: 152 mg/dL — ABNORMAL HIGH (ref 70–99)
Glucose-Capillary: 63 mg/dL — ABNORMAL LOW (ref 70–99)
Glucose-Capillary: 134 mg/dL — ABNORMAL HIGH (ref 70–99)
Glucose-Capillary: 109 mg/dL — ABNORMAL HIGH (ref 70–99)

## 2020-07-06 LAB — HEMOGLOBIN A1C
Hgb A1c MFr Bld: 7.7 % — ABNORMAL HIGH (ref 4.8–5.6)
Mean Plasma Glucose: 174.29 mg/dL

## 2020-07-06 LAB — BASIC METABOLIC PANEL
Anion gap: 9 (ref 5–15)
BUN: 28 mg/dL — ABNORMAL HIGH (ref 8–23)
CO2: 22 mmol/L (ref 22–32)
Calcium: 8.9 mg/dL (ref 8.9–10.3)
Chloride: 106 mmol/L (ref 98–111)
Creatinine, Ser: 2.22 mg/dL — ABNORMAL HIGH (ref 0.61–1.24)
GFR, Estimated: 32 mL/min — ABNORMAL LOW (ref >60)
Glucose, Bld: 108 mg/dL — ABNORMAL HIGH (ref 70–99)
Potassium: 3.8 mmol/L (ref 3.5–5.1)
Sodium: 137 mmol/L (ref 135–145)

## 2020-07-06 LAB — POCT ACTIVATED CLOTTING TIME
Activated Clotting Time: 267 seconds
Activated Clotting Time: 285 seconds

## 2020-07-06 LAB — ECHOCARDIOGRAM COMPLETE
Area-P 1/2: 4.89 cm2
S' Lateral: 3.8 cm

## 2020-07-06 SURGERY — LEFT HEART CATH AND CORS/GRAFTS ANGIOGRAPHY
Anesthesia: LOCAL

## 2020-07-06 MED ORDER — HEPARIN SODIUM (PORCINE) 1000 UNIT/ML IJ SOLN
INTRAMUSCULAR | Status: AC
  Filled 2020-07-06: qty 1

## 2020-07-06 MED ORDER — ASPIRIN 81 MG PO CHEW
81.0000 mg | CHEWABLE_TABLET | ORAL | Status: AC
  Administered 2020-07-06: 81 mg via ORAL

## 2020-07-06 MED ORDER — IOHEXOL 350 MG/ML SOLN
INTRAVENOUS | Status: DC | PRN
  Administered 2020-07-06: 70 mL

## 2020-07-06 MED ORDER — MIDAZOLAM HCL 2 MG/2ML IJ SOLN
INTRAMUSCULAR | Status: AC
  Filled 2020-07-06: qty 2

## 2020-07-06 MED ORDER — HEPARIN (PORCINE) IN NACL 1000-0.9 UT/500ML-% IV SOLN
INTRAVENOUS | Status: DC | PRN
  Administered 2020-07-06 (×2): 500 mL

## 2020-07-06 MED ORDER — CLOPIDOGREL BISULFATE 300 MG PO TABS
ORAL_TABLET | ORAL | Status: AC
  Filled 2020-07-06: qty 1

## 2020-07-06 MED ORDER — INSULIN GLARGINE 100 UNIT/ML ~~LOC~~ SOLN
70.0000 [IU] | Freq: Every day | SUBCUTANEOUS | Status: DC
  Administered 2020-07-06 – 2020-07-07 (×2): 70 [IU] via SUBCUTANEOUS
  Filled 2020-07-06 (×2): qty 0.7

## 2020-07-06 MED ORDER — CLOPIDOGREL BISULFATE 300 MG PO TABS
ORAL_TABLET | ORAL | Status: DC | PRN
  Administered 2020-07-06: 300 mg via ORAL

## 2020-07-06 MED ORDER — HEPARIN SODIUM (PORCINE) 1000 UNIT/ML IJ SOLN
INTRAMUSCULAR | Status: DC | PRN
  Administered 2020-07-06: 4000 [IU] via INTRAVENOUS
  Administered 2020-07-06: 2000 [IU] via INTRAVENOUS
  Administered 2020-07-06: 4000 [IU] via INTRAVENOUS

## 2020-07-06 MED ORDER — HEPARIN SODIUM (PORCINE) 5000 UNIT/ML IJ SOLN
5000.0000 [IU] | Freq: Three times a day (TID) | INTRAMUSCULAR | Status: DC
  Filled 2020-07-06 (×2): qty 1

## 2020-07-06 MED ORDER — SODIUM CHLORIDE 0.9 % IV SOLN: 250.0000 mL | INTRAVENOUS | Status: DC | PRN

## 2020-07-06 MED ORDER — SODIUM CHLORIDE 0.9% FLUSH: 3.0000 mL | INTRAVENOUS | Status: DC | PRN

## 2020-07-06 MED ORDER — LIDOCAINE HCL (PF) 1 % IJ SOLN
INTRAMUSCULAR | Status: AC
  Filled 2020-07-06: qty 30

## 2020-07-06 MED ORDER — RANOLAZINE ER 500 MG PO TB12
500.0000 mg | ORAL_TABLET | Freq: Two times a day (BID) | ORAL | Status: DC
  Administered 2020-07-06 – 2020-07-07 (×2): 500 mg via ORAL
  Filled 2020-07-06 (×2): qty 1

## 2020-07-06 MED ORDER — SODIUM CHLORIDE 0.9% FLUSH: 3.0000 mL | Freq: Two times a day (BID) | INTRAVENOUS | Status: DC

## 2020-07-06 MED ORDER — VERAPAMIL HCL 2.5 MG/ML IV SOLN
INTRAVENOUS | Status: DC | PRN
  Administered 2020-07-06 (×2): 500 ug via INTRACORONARY

## 2020-07-06 MED ORDER — MIDAZOLAM HCL 2 MG/2ML IJ SOLN
INTRAMUSCULAR | Status: DC | PRN
  Administered 2020-07-06: 1 mg via INTRAVENOUS

## 2020-07-06 MED ORDER — NITROGLYCERIN 0.4 MG SL SUBL: 0.4000 mg | SUBLINGUAL_TABLET | SUBLINGUAL | Status: DC | PRN

## 2020-07-06 MED ORDER — ROSUVASTATIN CALCIUM 20 MG PO TABS
40.0000 mg | ORAL_TABLET | Freq: Every day | ORAL | Status: DC
  Administered 2020-07-06: 40 mg via ORAL
  Filled 2020-07-06: qty 2

## 2020-07-06 MED ORDER — LABETALOL HCL 5 MG/ML IV SOLN: 10.0000 mg | INTRAVENOUS | Status: AC | PRN

## 2020-07-06 MED ORDER — INSULIN GLARGINE 100 UNIT/ML ~~LOC~~ SOLN
70.0000 [IU] | Freq: Every day | SUBCUTANEOUS | Status: DC
  Filled 2020-07-06: qty 0.7

## 2020-07-06 MED ORDER — SODIUM CHLORIDE 0.9 % WEIGHT BASED INFUSION
3.0000 mL/kg/h | INTRAVENOUS | Status: DC
  Administered 2020-07-06: 3 mL/kg/h via INTRAVENOUS

## 2020-07-06 MED ORDER — HEPARIN (PORCINE) IN NACL 1000-0.9 UT/500ML-% IV SOLN
INTRAVENOUS | Status: AC
  Filled 2020-07-06: qty 1000

## 2020-07-06 MED ORDER — IOHEXOL 350 MG/ML SOLN
INTRAVENOUS | Status: AC
  Filled 2020-07-06: qty 1

## 2020-07-06 MED ORDER — NITROGLYCERIN 1 MG/10 ML FOR IR/CATH LAB
INTRA_ARTERIAL | Status: AC
  Filled 2020-07-06: qty 10

## 2020-07-06 MED ORDER — NITROGLYCERIN 1 MG/10 ML FOR IR/CATH LAB
INTRA_ARTERIAL | Status: DC | PRN
  Administered 2020-07-06: 200 ug

## 2020-07-06 MED ORDER — METOPROLOL SUCCINATE ER 50 MG PO TB24
50.0000 mg | ORAL_TABLET | Freq: Every evening | ORAL | Status: DC
  Administered 2020-07-06: 50 mg via ORAL
  Filled 2020-07-06: qty 1

## 2020-07-06 MED ORDER — ASPIRIN EC 81 MG PO TBEC
81.0000 mg | DELAYED_RELEASE_TABLET | Freq: Every day | ORAL | Status: DC
  Administered 2020-07-07 (×2): 81 mg via ORAL
  Filled 2020-07-06 (×2): qty 1

## 2020-07-06 MED ORDER — INSULIN ASPART 100 UNIT/ML ~~LOC~~ SOLN: 0.0000 [IU] | Freq: Three times a day (TID) | SUBCUTANEOUS | Status: DC

## 2020-07-06 MED ORDER — FENTANYL CITRATE (PF) 100 MCG/2ML IJ SOLN
INTRAMUSCULAR | Status: DC | PRN
  Administered 2020-07-06: 25 ug via INTRAVENOUS

## 2020-07-06 MED ORDER — CLOPIDOGREL BISULFATE 75 MG PO TABS
75.0000 mg | ORAL_TABLET | Freq: Every day | ORAL | Status: DC
  Administered 2020-07-07: 75 mg via ORAL
  Filled 2020-07-06: qty 1

## 2020-07-06 MED ORDER — INSULIN GLARGINE 100 UNIT/ML SOLOSTAR PEN: 70.0000 [IU] | PEN_INJECTOR | Freq: Every day | SUBCUTANEOUS | Status: DC

## 2020-07-06 MED ORDER — SODIUM CHLORIDE 0.9 % WEIGHT BASED INFUSION
1.0000 mL/kg/h | INTRAVENOUS | Status: DC
  Administered 2020-07-06: 1 mL/kg/h via INTRAVENOUS

## 2020-07-06 MED ORDER — VERAPAMIL HCL 2.5 MG/ML IV SOLN
INTRAVENOUS | Status: AC
  Filled 2020-07-06: qty 2

## 2020-07-06 MED ORDER — SODIUM CHLORIDE 0.9 % IV SOLN
INTRAVENOUS | Status: AC
  Administered 2020-07-06: 525 mL via INTRAVENOUS

## 2020-07-06 MED ORDER — SODIUM CHLORIDE 0.9 % IV SOLN
INTRAVENOUS | Status: AC | PRN
  Administered 2020-07-06: 250 mL via INTRAVENOUS

## 2020-07-06 MED ORDER — ACETAMINOPHEN 325 MG PO TABS: 650.0000 mg | ORAL_TABLET | ORAL | Status: DC | PRN

## 2020-07-06 MED ORDER — HYDRALAZINE HCL 20 MG/ML IJ SOLN: 10.0000 mg | INTRAMUSCULAR | Status: AC | PRN

## 2020-07-06 MED ORDER — PANTOPRAZOLE SODIUM 40 MG PO TBEC
40.0000 mg | DELAYED_RELEASE_TABLET | Freq: Every day | ORAL | Status: DC
  Administered 2020-07-06 – 2020-07-07 (×2): 40 mg via ORAL
  Filled 2020-07-06 (×2): qty 1

## 2020-07-06 MED ORDER — LIDOCAINE HCL (PF) 1 % IJ SOLN
INTRAMUSCULAR | Status: DC | PRN
  Administered 2020-07-06: 2 mL

## 2020-07-06 MED ORDER — CLOPIDOGREL BISULFATE 75 MG PO TABS
75.0000 mg | ORAL_TABLET | Freq: Once | ORAL | Status: AC
  Administered 2020-07-06: 75 mg via ORAL

## 2020-07-06 MED ORDER — VERAPAMIL HCL 2.5 MG/ML IV SOLN
INTRAVENOUS | Status: DC | PRN
  Administered 2020-07-06: 10 mL via INTRA_ARTERIAL

## 2020-07-06 MED ORDER — FENTANYL CITRATE (PF) 100 MCG/2ML IJ SOLN
INTRAMUSCULAR | Status: AC
  Filled 2020-07-06: qty 2

## 2020-07-06 MED ORDER — ONDANSETRON HCL 4 MG/2ML IJ SOLN: 4.0000 mg | Freq: Four times a day (QID) | INTRAMUSCULAR | Status: DC | PRN

## 2020-07-06 SURGICAL SUPPLY — 22 items
BALLN EMERGE MR 2.0X8 (BALLOONS) ×2
BALLN SAPPHIRE ~~LOC~~ 2.25X8 (BALLOONS) ×2 IMPLANT
BALLOON EMERGE MR 2.0X8 (BALLOONS) ×1 IMPLANT
CATH INFINITI 5 FR JL3.5 (CATHETERS) ×2 IMPLANT
CATH INFINITI 5FR AL1 (CATHETERS) ×2 IMPLANT
CATH INFINITI 5FR MULTPACK ANG (CATHETERS) ×2 IMPLANT
CATH LAUNCHER 6FR AL1 (CATHETERS) ×1 IMPLANT
CATHETER LAUNCHER 6FR AL1 (CATHETERS) ×2
DEVICE RAD COMP TR BAND LRG (VASCULAR PRODUCTS) ×2 IMPLANT
GLIDESHEATH SLEND SS 6F .021 (SHEATH) ×2 IMPLANT
GUIDEWIRE ANGLED .035X150CM (WIRE) ×2 IMPLANT
GUIDEWIRE INQWIRE 1.5J.035X260 (WIRE) ×1 IMPLANT
INQWIRE 1.5J .035X260CM (WIRE) ×2
KIT ENCORE 26 ADVANTAGE (KITS) ×2 IMPLANT
KIT HEART LEFT (KITS) ×2 IMPLANT
PACK CARDIAC CATHETERIZATION (CUSTOM PROCEDURE TRAY) ×2 IMPLANT
STENT RESOLUTE ONYX 2.0X12 (Permanent Stent) ×2 IMPLANT
STENT RESOLUTE ONYX 4.0X18 (Permanent Stent) ×2 IMPLANT
TRANSDUCER W/STOPCOCK (MISCELLANEOUS) ×2 IMPLANT
TUBING CIL FLEX 10 FLL-RA (TUBING) ×2 IMPLANT
WIRE EMERALD ST .035X150CM (WIRE) ×2 IMPLANT
WIRE RUNTHROUGH .014X180CM (WIRE) ×2 IMPLANT

## 2020-07-06 NOTE — Progress Notes (Signed)
  Echocardiogram 2D Echocardiogram has been performed.  Eugene [name] 07/06/2020, 11:33 AM

## 2020-07-06 NOTE — ED Notes (Addendum)
Patient stated "I'm going home. I have a procedure today at 9am." Was told risk of leaving, LWBS, moving OTF.

## 2020-07-06 NOTE — Interval H&P Note (Signed)
History and Physical Interval Note:  07/06/2020 2:08 PM  Eugene [name]  has presented today for surgery, with the diagnosis of unstable angina.  The various methods of treatment have been discussed with the patient and family. After consideration of risks, benefits and other options for treatment, the patient has consented to  Procedure(s): LEFT HEART CATH AND CORS/GRAFTS ANGIOGRAPHY (N/A) as a surgical intervention.  The patient's history has been reviewed, patient examined, no change in status, stable for surgery.  I have reviewed the patient's chart and labs.  Questions were answered to the patient's satisfaction.    Pre-cath labs yesterday were notable for mildly elevated HS-TnT (197) and creatinine of 2.5.  Patient was contacted last night and instructed to go to the ED.  He waited in the ED waiting room for several hours and ultimately left before being seen by a provider.  He denies further chest pain this morning, presenting early for 6 hours of prehydration.  Repeat BMP this morning showed improved creatinine to 2.2, similar to baseline labs drawn through [facility].  Risks of procedure were discussed with [name], particularly elevated risk for CIN.  He has agreed to proceed.  Cath Lab Visit (complete for each Cath Lab visit)  Clinical Evaluation Leading to the Procedure:   ACS: Yes.    Non-ACS:    [name]

## 2020-07-06 NOTE — ED Notes (Signed)
Refused Vitals

## 2020-07-07 ENCOUNTER — Encounter (HOSPITAL_COMMUNITY): Payer: Self-pay | Admitting: Internal Medicine

## 2020-07-07 DIAGNOSIS — I509 Heart failure, unspecified: Secondary | ICD-10-CM | POA: Diagnosis not present

## 2020-07-07 DIAGNOSIS — J45909 Unspecified asthma, uncomplicated: Secondary | ICD-10-CM | POA: Diagnosis not present

## 2020-07-07 DIAGNOSIS — E119 Type 2 diabetes mellitus without complications: Secondary | ICD-10-CM | POA: Diagnosis not present

## 2020-07-07 DIAGNOSIS — I200 Unstable angina: Secondary | ICD-10-CM

## 2020-07-07 DIAGNOSIS — Z794 Long term (current) use of insulin: Secondary | ICD-10-CM | POA: Diagnosis not present

## 2020-07-07 DIAGNOSIS — I25119 Atherosclerotic heart disease of native coronary artery with unspecified angina pectoris: Secondary | ICD-10-CM | POA: Diagnosis not present

## 2020-07-07 DIAGNOSIS — Z79899 Other long term (current) drug therapy: Secondary | ICD-10-CM | POA: Diagnosis not present

## 2020-07-07 DIAGNOSIS — I110 Hypertensive heart disease with heart failure: Secondary | ICD-10-CM | POA: Diagnosis not present

## 2020-07-07 DIAGNOSIS — Z8673 Personal history of transient ischemic attack (TIA), and cerebral infarction without residual deficits: Secondary | ICD-10-CM | POA: Diagnosis not present

## 2020-07-07 LAB — BASIC METABOLIC PANEL
Anion gap: 10 (ref 5–15)
BUN: 27 mg/dL — ABNORMAL HIGH (ref 8–23)
CO2: 19 mmol/L — ABNORMAL LOW (ref 22–32)
Calcium: 8.9 mg/dL (ref 8.9–10.3)
Chloride: 109 mmol/L (ref 98–111)
Creatinine, Ser: 2.03 mg/dL — ABNORMAL HIGH (ref 0.61–1.24)
GFR, Estimated: 36 mL/min — ABNORMAL LOW (ref >60)
Glucose, Bld: 135 mg/dL — ABNORMAL HIGH (ref 70–99)
Potassium: 3.7 mmol/L (ref 3.5–5.1)
Sodium: 138 mmol/L (ref 135–145)

## 2020-07-07 LAB — CBC
HCT: 34.4 % — ABNORMAL LOW (ref 39.0–52.0)
Hemoglobin: 11.2 g/dL — ABNORMAL LOW (ref 13.0–17.0)
MCH: 28.5 pg (ref 26.0–34.0)
MCHC: 32.6 g/dL (ref 30.0–36.0)
MCV: 87.5 fL (ref 80.0–100.0)
Platelets: 183 10*3/uL (ref 150–400)
RBC: 3.93 MIL/uL — ABNORMAL LOW (ref 4.22–5.81)
RDW: 14.1 % (ref 11.5–15.5)
WBC: 8.8 10*3/uL (ref 4.0–10.5)
nRBC: 0 % (ref 0.0–0.2)

## 2020-07-07 LAB — GLUCOSE, CAPILLARY: Glucose-Capillary: 73 mg/dL (ref 70–99)

## 2020-07-07 MED ORDER — METFORMIN HCL 500 MG PO TABS
500.0000 mg | ORAL_TABLET | Freq: Two times a day (BID) | ORAL | Status: DC
Start: 2020-07-09 — End: 2020-09-16

## 2020-07-07 NOTE — Telephone Encounter (Signed)
Spoke with patient and he did have stents placed yesterday and was told he needed to wait 1-3 months before his colonoscopy. Told patient to let us know when his cardiologist approved him to hold Plavix and we would reschedule his procedure.

## 2020-07-07 NOTE — Discharge Summary (Signed)
Discharge Summary    Patient ID: Eugene [name] MRN: [id number]; DOB: 1956-07-05  Admit date: 07/06/2020 Discharge date: 07/07/2020  Primary Care Provider: [name], MD  Primary Cardiologist: Cristopher Peru, MD  Primary Electrophysiologist:  None   Discharge Diagnoses    Principal Problem:   Unstable angina [facility]) Active Problems:   Hyperlipidemia   HYPERTENSION, BENIGN    Diagnostic Studies/Procedures    Cath: 07/06/20  Conclusions: 1. Severe native coronary artery disease, including chronic total occlusions of the ostial LAD and proximal RCA.  Severe diffuse mid/distal LAD and LCx disease is also present. 2. Widely patent LIMA-LAD. 3. Patent SVG-OM2 with 70% proximal graft stenosis and 95% OM2 lesion distal to SVG anastomosis. 4. Known chronic total occlusions of SVG-ramus and SVG-rPDA (not engaged on today's study). 5. Normal left ventricular filling pressure. 6. Successful PCI to proximal SVG-OM2 using Resolute Onyx 4.0 x 18 mm drug-eluting stent with 0% residual stenosis and TIMI-3 flow. 7. Successful PCI to OM2 using Resolute Onyx 2.0 x 12 mm drug-eluting stent with 0% residual stenosis and TIMI-3 flow.  Recommendations: 1. Overnight post-cath recovery and gentle hydration in the setting of chronic kidney disease. 2. Continue indefinite DAPT with aspirin and clopidogrel. 3. Aggressive secondary prevention and medical management of residual coronary artery disease.  There are no other targets for revascularization.  Nelva Bush, MD [facility]  Diagnostic Dominance: Right    Intervention    Echo: 07/06/20  IMPRESSIONS    1. Poor acoustic windows limit study Endocardium is difficult to see. The  inferior and inferoseptal walls appear hypokinetic. REcomm Limited echo  with Definity to confirm wall motion . Left ventricular ejection fraction,  by estimation, is 50 to 55%. The  left ventricle has low normal function. There is mild left  ventricular  hypertrophy. Left ventricular diastolic parameters are consistent with  Grade I diastolic dysfunction (impaired relaxation).  2. Right ventricular systolic function is normal. The right ventricular  size is normal.  3. The mitral valve is abnormal. Trivial mitral valve regurgitation.  4. The aortic valve is abnormal. Aortic valve regurgitation is not  visualized. Mild to moderate aortic valve sclerosis/calcification is  present, without any evidence of aortic stenosis.   FINDINGS  Left Ventricle: Poor acoustic windows limit study Endocardium is  difficult to see. The inferior and inferoseptal walls appear hypokinetic.  REcomm Limited echo with Definity to confirm wall motion. Left ventricular  ejection fraction, by estimation, is 50  to 55%. The left ventricle has low normal function. The left ventricle  demonstrates regional wall motion abnormalities. The left ventricular  internal cavity size was normal in size. There is mild left ventricular  hypertrophy. Left ventricular diastolic  parameters are consistent with Grade I diastolic dysfunction (impaired  relaxation).   Right Ventricle: The right ventricular size is normal. Right vetricular  wall thickness was not assessed. Right ventricular systolic function is  normal.   Left Atrium: Left atrial size was normal in size.   Right Atrium: Right atrial size was normal in size.   Pericardium: There is no evidence of pericardial effusion.   Mitral Valve: The mitral valve is abnormal. There is mild thickening of  the mitral valve leaflet(s). Mild mitral annular calcification. Trivial  mitral valve regurgitation.   Tricuspid Valve: The tricuspid valve is not well visualized. Tricuspid  valve regurgitation is trivial.   Aortic Valve: The aortic valve is abnormal. Aortic valve regurgitation is  not visualized. Mild to moderate aortic valve sclerosis/calcification is  present, without any evidence of aortic  stenosis.   Pulmonic Valve: The pulmonic valve was normal in structure. Pulmonic valve  regurgitation is not visualized.   Aorta: The aortic root is normal in size and structure.   Venous: The inferior vena cava was not well visualized.   IAS/Shunts: The interatrial septum was not assessed.  _____________   History of Present Illness     Eugene [name] is a 64 y.o. 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 Lovena Le by Joseph Art. Last seen by him Jan 2021, described class II HF symptoms, no CP, no changes were made.  Presented to the office requiring clearance for EGD/colonoscopy, plavix to be held 5 days.  In effort for completeness, given his hx, pt was called by the preop pool and mentioned that he had been having some CP, SOB new since his visit with Dr. Lovena Le in January and recommended he be seen prior to his planned procedure.  Reported he was to get a colonoscopy 2/2 40lb unintentional weight loss over the year and reduced appetite. He since his cath back in 2019 has had some occasional CP, though in the last 3-6 months has noticed increased frequency/severity of his CP.  He stated that he has associated SOB with it, is central, pressure that can be very heavy, is non-radiating, no N/V, no diaphoresais. It can occur with minimal exertion some times and will resolve with resting alone, often will require s/l NTG, and occasionally will require 2 doses of NTG to resolve. He denied any rest symptoms, no rest SOB, no symptoms of orthopnea or PN. He has never been woke with CP.   Given these symptoms he was set up for outpatient cardiac cath. On review of his labs, he was noted to have an elevated troponin and Cr. Recommendations were given to proceed to the ED further evaluation and hydration for cath. He went to the ED was  not seen for several hours. He left there around 3am but was called to come in around 5am to short stay for hydration for cath.   Hospital Course     Underwent cardiac cath noted above with severe native vessel disease, including chronic total occlusions of the ostial LAD and pRCA, with severe diffuse m/dLAD and Lcx disease. Patent LIMA-LAD, SVG-OM2 with 70% prox graft disease and 95% OM2 lesion distal to SVG anastomosis. Known CTO of SVG-ramus and SVG-rPDA. Had successful PCI/DESx to SVG-OM2, and PCI/DESx1 OM2. Plan for DAPT with ASA/plavix. Echo this admission showed hypokinesis in the inferior and inferoseptal walls, with EF of 50-55%. No chest pain overnight. Worked well with cardiac rehab. He was continued on his home medications without significant change.   Did the patient have an acute coronary syndrome (MI, NSTEMI, STEMI, etc) this admission?:  No                               Did the patient have a percutaneous coronary intervention (stent / angioplasty)?:  Yes.     Cath/PCI Registry Performance & Quality Measures: 4. Aspirin prescribed? - Yes 5. ADP Receptor Inhibitor (Plavix/Clopidogrel, Brilinta/Ticagrelor or Effient/Prasugrel) prescribed (includes medically managed patients)? - Yes 6. High Intensity Statin (Lipitor 40-80mg  or Crestor 20-40mg ) prescribed? - Yes 7. For EF <40%, was ACEI/ARB  prescribed? - Not Applicable (EF >/= 49%) 8. For EF <40%, Aldosterone Antagonist (Spironolactone or Eplerenone) prescribed? - Not Applicable (EF >/= 17%) 9. Cardiac Rehab Phase II ordered? - Yes   General: Well developed, well nourished, male appearing in no acute distress. Head: Normocephalic, atraumatic.  Neck: Supple without bruits, JVD. Lungs:  Resp regular and unlabored, CTA. Heart: RRR, S1, S2, no S3, S4, or murmur; no rub. Abdomen: Soft, non-tender, non-distended with normoactive bowel sounds. No hepatomegaly. No rebound/guarding. No obvious abdominal masses. Extremities: No clubbing,  cyanosis, edema. Distal pedal pulses are 2+ bilaterally. Left radial cath site stable without bruising or hematoma Neuro: Alert and oriented X 3. Moves all extremities spontaneously. Psych: Normal affect.     _____________  Discharge Vitals Blood pressure 97/62, pulse 70, temperature 98.4 F (36.9 C), temperature source Oral, resp. rate 20, weight 75.3 kg, SpO2 99 %.  Filed Weights   07/07/20 0603  Weight: 75.3 kg    Labs & Radiologic Studies    CBC Recent Labs    07/05/20 1019 07/07/20 0041  WBC 11.6* 8.8  HGB 12.6* 11.2*  HCT 37.8 34.4*  MCV 85 87.5  PLT 226 915   Basic Metabolic Panel Recent Labs    07/06/20 1000 07/07/20 0041  NA 137 138  K 3.8 3.7  CL 106 109  CO2 22 19*  GLUCOSE 108* 135*  BUN 28* 27*  CREATININE 2.22* 2.03*  CALCIUM 8.9 8.9   Liver Function Tests No results for input(s): AST, ALT, ALKPHOS, BILITOT, PROT, ALBUMIN in the last 72 hours. No results for input(s): LIPASE, AMYLASE in the last 72 hours. High Sensitivity Troponin:   No results for input(s): TROPONINIHS in the last 720 hours.  BNP Invalid input(s): POCBNP D-Dimer No results for input(s): DDIMER in the last 72 hours. Hemoglobin A1C Recent Labs    07/06/20 1652  HGBA1C 7.7*   Fasting Lipid Panel No results for input(s): CHOL, HDL, LDLCALC, TRIG, CHOLHDL, LDLDIRECT in the last 72 hours. Thyroid Function Tests No results for input(s): TSH, T4TOTAL, T3FREE, THYROIDAB in the last 72 hours.  Invalid input(s): FREET3 _____________  CARDIAC CATHETERIZATION  Result Date: 07/06/2020 Conclusions: 1. Severe native coronary artery disease, including chronic total occlusions of the ostial LAD and proximal RCA.  Severe diffuse mid/distal LAD and LCx disease is also present. 2. Widely patent LIMA-LAD. 3. Patent SVG-OM2 with 70% proximal graft stenosis and 95% OM2 lesion distal to SVG anastomosis. 4. Known chronic total occlusions of SVG-ramus and SVG-rPDA (not engaged on today's study).  5. Normal left ventricular filling pressure. 6. Successful PCI to proximal SVG-OM2 using Resolute Onyx 4.0 x 18 mm drug-eluting stent with 0% residual stenosis and TIMI-3 flow. 7. Successful PCI to OM2 using Resolute Onyx 2.0 x 12 mm drug-eluting stent with 0% residual stenosis and TIMI-3 flow. Recommendations: 1. Overnight post-cath recovery and gentle hydration in the setting of chronic kidney disease. 2. Continue indefinite DAPT with aspirin and clopidogrel. 3. Aggressive secondary prevention and medical management of residual coronary artery disease.  There are no other targets for revascularization. Nelva Bush, MD [facility]   ECHOCARDIOGRAM COMPLETE  Result Date: 07/06/2020    ECHOCARDIOGRAM REPORT   Patient Name:   Eugene [name] [facility] Date of Exam: 07/06/2020 Medical Rec #:  [id number]    Height:       67.0 in Accession #:    [id number]   Weight:       170.8 lb Date of Birth:  05/17/1956   BSA:  1.891 m Patient Age:    64 years     BP:           121/76 mmHg Patient Gender: M            HR:           70 bpm. Exam Location:  Inpatient Procedure: 2D Echo, Cardiac Doppler and Color Doppler Indications:    Elevated Troponin  History:        Patient has no prior history of Echocardiogram examinations.                 CHF, CAD and Previous Myocardial Infarction, Prior CABG and                 Defibrillator, TIA, Arrythmias:VF arrest; Risk Factors:Diabetes,                 Hypertension and Dyslipidemia.  Sonographer:    Dustin Flock Referring Phys: [place]  1. Poor acoustic windows limit study Endocardium is difficult to see. The inferior and inferoseptal walls appear hypokinetic. REcomm Limited echo with Definity to confirm wall motion . Left ventricular ejection fraction, by estimation, is 50 to 55%. The  left ventricle has low normal function. There is mild left ventricular hypertrophy. Left ventricular diastolic parameters are consistent with Grade I diastolic  dysfunction (impaired relaxation).  2. Right ventricular systolic function is normal. The right ventricular size is normal.  3. The mitral valve is abnormal. Trivial mitral valve regurgitation.  4. The aortic valve is abnormal. Aortic valve regurgitation is not visualized. Mild to moderate aortic valve sclerosis/calcification is present, without any evidence of aortic stenosis. FINDINGS  Left Ventricle: Poor acoustic windows limit study Endocardium is difficult to see. The inferior and inferoseptal walls appear hypokinetic. REcomm Limited echo with Definity to confirm wall motion. Left ventricular ejection fraction, by estimation, is 50  to 55%. The left ventricle has low normal function. The left ventricle demonstrates regional wall motion abnormalities. The left ventricular internal cavity size was normal in size. There is mild left ventricular hypertrophy. Left ventricular diastolic parameters are consistent with Grade I diastolic dysfunction (impaired relaxation). Right Ventricle: The right ventricular size is normal. Right vetricular wall thickness was not assessed. Right ventricular systolic function is normal. Left Atrium: Left atrial size was normal in size. Right Atrium: Right atrial size was normal in size. Pericardium: There is no evidence of pericardial effusion. Mitral Valve: The mitral valve is abnormal. There is mild thickening of the mitral valve leaflet(s). Mild mitral annular calcification. Trivial mitral valve regurgitation. Tricuspid Valve: The tricuspid valve is not well visualized. Tricuspid valve regurgitation is trivial. Aortic Valve: The aortic valve is abnormal. Aortic valve regurgitation is not visualized. Mild to moderate aortic valve sclerosis/calcification is present, without any evidence of aortic stenosis. Pulmonic Valve: The pulmonic valve was normal in structure. Pulmonic valve regurgitation is not visualized. Aorta: The aortic root is normal in size and structure. Venous: The  inferior vena cava was not well visualized. IAS/Shunts: The interatrial septum was not assessed.  LEFT VENTRICLE PLAX 2D LVIDd:         4.70 cm LVIDs:         3.80 cm LV PW:         1.30 cm LV IVS:        0.90 cm LVOT diam:     2.10 cm LV SV:         54 LV SV Index:   28  LVOT Area:     3.46 cm  RIGHT VENTRICLE RV Basal diam:  2.40 cm RV S prime:     9.57 cm/s TAPSE (M-mode): 2.4 cm LEFT ATRIUM             Index       RIGHT ATRIUM           Index LA diam:        3.90 cm 2.06 cm/m  RA Area:     12.00 cm LA Vol (A2C):   60.4 ml 31.94 ml/m RA Volume:   27.00 ml  14.28 ml/m LA Vol (A4C):   29.3 ml 15.49 ml/m LA Biplane Vol: 43.1 ml 22.79 ml/m  AORTIC VALVE LVOT Vmax:   81.60 cm/s LVOT Vmean:  58.300 cm/s LVOT VTI:    0.155 m  AORTA Ao Root diam: 3.20 cm MITRAL VALVE MV Area (PHT): 4.89 cm     SHUNTS MV Decel Time: 155 msec     Systemic VTI:  0.16 m MV E velocity: 85.30 cm/s   Systemic Diam: 2.10 cm MV A velocity: 102.00 cm/s MV E/A ratio:  0.84 Dorris Carnes MD Electronically signed by Dorris Carnes MD Signature Date/Time: 07/06/2020/1:52:01 PM    Final    Disposition   Pt is being discharged home today in good condition.  Follow-up Plans & Appointments     Follow-up Information    [name], MD Follow up on 08/04/2020.   Specialty: Cardiology Why: at 10:30am for your follow up appt.  Contact information: [address] [place] [address] [phone]              Discharge Instructions    AMB Referral to Cardiac Rehabilitation - Phase II   Complete by: As directed    Diagnosis: Coronary Stents   After initial evaluation and assessments completed: Virtual Based Care may be provided alone or in conjunction with Phase 2 Cardiac Rehab based on patient barriers.: Yes   Diet - low sodium heart healthy   Complete by: As directed    Discharge instructions   Complete by: As directed    Radial Site Care Refer to this sheet in the next few weeks. These instructions provide  you with information on caring for yourself after your procedure. Your caregiver may also give you more specific instructions. Your treatment has been planned according to current medical practices, but problems sometimes occur. Call your caregiver if you have any problems or questions after your procedure. HOME CARE INSTRUCTIONS You may shower the day after the procedure.Remove the bandage (dressing) and gently wash the site with plain soap and water.Gently pat the site dry.  Do not apply powder or lotion to the site.  Do not submerge the affected site in water for 3 to 5 days.  Inspect the site at least twice daily.  Do not flex or bend the affected arm for 24 hours.  No lifting over 5 pounds (2.3 kg) for 5 days after your procedure.  Do not drive home if you are discharged the same day of the procedure. Have someone else drive you.  You may drive 24 hours after the procedure unless otherwise instructed by your caregiver.  What to expect: Any bruising will usually fade within 1 to 2 weeks.  Blood that collects in the tissue (hematoma) may be painful to the touch. It should usually decrease in size and tenderness within 1 to 2 weeks.  SEEK IMMEDIATE MEDICAL CARE IF: You have  unusual pain at the radial site.  You have redness, warmth, swelling, or pain at the radial site.  You have drainage (other than a small amount of blood on the dressing).  You have chills.  You have a fever or persistent symptoms for more than 72 hours.  You have a fever and your symptoms suddenly get worse.  Your arm becomes pale, cool, tingly, or numb.  You have heavy bleeding from the site. Hold pressure on the site.   Increase activity slowly   Complete by: As directed       Discharge Medications   Allergies as of 07/07/2020   No Known Allergies     Medication List    TAKE these medications   aspirin 81 MG tablet Take 81 mg by mouth daily.   clopidogrel 75 MG tablet Commonly known as: PLAVIX Take  75 mg by mouth daily.   dapagliflozin propanediol 10 MG Tabs tablet Commonly known as: FARXIGA Take 10 mg by mouth at bedtime.   insulin glargine 100 UNIT/ML Solostar Pen Commonly known as: LANTUS Inject 70 Units into the skin daily before breakfast.   metFORMIN 500 MG tablet Commonly known as: GLUCOPHAGE Take 1 tablet (500 mg total) by mouth 2 (two) times daily with a meal. Start taking on: July 09, 2020 What changed: These instructions start on July 09, 2020. If you are unsure what to do until then, ask your doctor or other care provider.   metoprolol succinate 50 MG 24 hr tablet Commonly known as: TOPROL-XL TAKE ONE TABLET BY MOUTH ONE TIME DAILY. TAKE WITH OR IMMEDIATELY FOLLOWING A MEAL What changed: See the new instructions.   nitroGLYCERIN 0.4 MG SL tablet Commonly known as: NITROSTAT Place 0.4 mg under the tongue every 5 (five) minutes as needed for chest pain.   Ozempic (1 MG/DOSE) 4 MG/3ML Sopn Generic drug: Semaglutide (1 MG/DOSE) Inject into the skin every Thursday.   pantoprazole 40 MG tablet Commonly known as: PROTONIX Take 40 mg by mouth daily before breakfast.   ranolazine 500 MG 12 hr tablet Commonly known as: RANEXA TAKE ONE TABLET BY MOUTH TWICE DAILY.PLEASE MAKE APPT WITH DR.HARDING FOR REFILLS. What changed: See the new instructions.   rosuvastatin 40 MG tablet Commonly known as: CRESTOR Take 1 tablet (40 mg total) by mouth daily. What changed: when to take this       Outstanding Labs/Studies   N/a  Duration of Discharge Encounter   Greater than 30 minutes including physician time.  Signed, Reino Bellis, NP 07/07/2020, 10:08 AM   I have personally seen and examined this patient. I agree with the assessment and plan as outlined above.  He is doing well this am post PCI.  Renal function is stable.  Cath site without hematoma.  Continue DAPT with ASA and Plavix.  Discharge home today.   Lauree Chandler 07/07/2020 10:08 AM

## 2020-07-07 NOTE — Telephone Encounter (Signed)
Cancel colonoscopy - is getting cardiac cath  Ask him to let us know when he has had that done - he can also ask Dr. Burt Knack to send [place] results

## 2020-07-07 NOTE — Telephone Encounter (Signed)
Please review patient chart. He had a cardiac work up this week. Do you want to go ahead and cancel his procedure? Please advise.

## 2020-07-07 NOTE — Progress Notes (Signed)
CARDIAC REHAB PHASE I   PRE:  Rate/Rhythm: 63 SR  BP:  Supine:   Sitting: 113/76     SaO2: 100% RA  MODE:  Ambulation: 400 ft   POST:  Rate/Rhythm: 103 ST  BP:  Supine:   Sitting: 137/77    SaO2: 95% RA  Pt was seated in chair and was independent with getting up from chair. Pt ambulated 400 ft independently with stable/steady gait. Pt had no complaints or concerns while ambulating. Returned pt to chair. Discussed ASA and Plavix, restrictions, heart healthy diet, low sodium diet, exercise guidelines, and CRP II. Pt was receptive. Pt voiced interest in CRP II, will send referral to [facility].  [id number]  Rick Duff, [place], CEP 07/07/2020

## 2020-07-12 ENCOUNTER — Telehealth (HOSPITAL_COMMUNITY): Payer: Self-pay

## 2020-07-12 ENCOUNTER — Encounter: Payer: Medicare HMO | Admitting: Physician Assistant

## 2020-07-12 DIAGNOSIS — E1159 Type 2 diabetes mellitus with other circulatory complications: Secondary | ICD-10-CM | POA: Diagnosis not present

## 2020-07-12 NOTE — Telephone Encounter (Signed)
Called patient to see if he is interested in the Cardiac Rehab Program. Patient expressed interest. Explained scheduling process and went over insurance, patient verbalized understanding. Will contact patient for scheduling once f/u has been completed.  °

## 2020-07-13 ENCOUNTER — Encounter: Payer: Self-pay | Admitting: Internal Medicine

## 2020-07-14 ENCOUNTER — Encounter: Payer: Self-pay | Admitting: Internal Medicine

## 2020-07-26 ENCOUNTER — Other Ambulatory Visit (HOSPITAL_COMMUNITY): Payer: Medicare HMO

## 2020-07-26 ENCOUNTER — Encounter: Payer: Self-pay | Admitting: Internal Medicine

## 2020-07-26 ENCOUNTER — Ambulatory Visit: Payer: Medicare HMO | Admitting: Internal Medicine

## 2020-07-26 ENCOUNTER — Other Ambulatory Visit: Payer: Self-pay

## 2020-07-26 VITALS — BP 108/60 | HR 70 | Ht 66.0 in | Wt 170.0 lb

## 2020-07-26 DIAGNOSIS — I509 Heart failure, unspecified: Secondary | ICD-10-CM | POA: Diagnosis not present

## 2020-07-26 NOTE — Progress Notes (Signed)
Cardiology Office Note:    Date:  07/26/2020   ID:  Eugene [name], DOB 07/22/1956, MRN [id number]  PCP:  [name], MD  [facility] Cardiologist:  Cristopher Peru, MD  [facility] Electrophysiologist:  None   Referring MD: [name], MD   CC: Follow up [place]  History of Present Illness:    Eugene [name] is a 64 y.o. male with a hx of CAD (distant CABG, CTO ostial LAD and proxi RCA. Severe diffuse mid/distal LAD and LCx disease, patent LIMA-LAD,  SVG-OM2 with 70% proximal graft stenosis and 95% OM2 lesion distal to SVG anastomosis., CTO SVG-ramus and SVG-rPDA with 12/21 PCI to proximal SVG-OM2; PCI to OM2), Possible hx of CHF (low normal EF post cath), DM with HTN, HLD, Prior Stroke, PAD with prior cath and no intervention (smal vessels 2016), Prior VF Arrest s/p St. Jude ICD seen by Dr. Lovena Le who presents for evaluation.  Patient notes that he is doing pretty good.  Since heart catheterization has had no chest pain and no shortness of breath since cath.  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  No leg swelling or shortness.  Notes slight leg numbness with exertion.  Patient was seen for 40 lbs weight loss and is planned for coronary artery disease.No palpitations or syncope. No Nitro use since the procedure.  Ambulatory blood pressure 110/60.  Past Medical History:  Diagnosis Date  . Abnormal nuclear stress test 02/19/2018  . Angina, class III ([place]) 02/19/2018  . Anxiety   . Asthma   . CHF (congestive heart failure) ([place])   . Coronary artery disease   . DDD (degenerative disc disease), cervical 03/01/2013  . Diabetes mellitus    type 2  . 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 Dyslipidemia   . Hypertension   . Lumbosacral radiculopathy at L4 02/18/2017  . Myocardial infarction ([place]) 2012  . Obesity   . Sleep apnea 08/17/2013  . Snoring 10/12/2013  . TIA (transient ischemic attack) 08/09/2014  . Tubular adenoma of colon 01/12/2014    transverse colon  . VF (ventricular fibrillation) ([place])    arrest  . Vitamin D deficiency 02/18/2017    Past Surgical History:  Procedure Laterality Date  . ABDOMINAL AORTAGRAM N/A 12/14/2013   Procedure: ABDOMINAL Maxcine Ham;  Surgeon: [name], MD;  Location: [facility] CATH LAB;  Service: Cardiovascular;  Laterality: N/A;  . CARDIAC CATHETERIZATION  09/16/10  . COLONOSCOPY  01/12/2014  . CORONARY ARTERY BYPASS GRAFT  2006  . CORONARY STENT INTERVENTION  07/06/2020  . CORONARY STENT INTERVENTION N/A 07/06/2020   Procedure: CORONARY STENT INTERVENTION;  Surgeon: Nelva Bush, MD;  Location: [place] CV LAB;  Service: Cardiovascular;  Laterality: N/A;  . IMPLANTABLE CARDIOVERTER DEFIBRILLATOR IMPLANT  2012   STJ ICD- single chamber  . LEFT HEART CATH AND CORS/GRAFTS ANGIOGRAPHY N/A 02/19/2018   Procedure: LEFT HEART CATH AND CORS/GRAFTS ANGIOGRAPHY;  Surgeon: [name], MD;  Location: [place] CV LAB;  Service: Cardiovascular;  Laterality: N/A;  . LEFT HEART CATH AND CORS/GRAFTS ANGIOGRAPHY N/A 07/06/2020   Procedure: LEFT HEART CATH AND CORS/GRAFTS ANGIOGRAPHY;  Surgeon: Nelva Bush, MD;  Location: [facility] CV LAB;  Service: Cardiovascular;  Laterality: N/A;  . LOOP RECORDER IMPLANT N/A 08/24/2014   Procedure: LOOP RECORDER IMPLANT;  Surgeon: [name], MD;  Location: [facility] CATH LAB;  Service: Cardiovascular;  Laterality: N/A;  . MASS EXCISION  2008   right  shoulder  . STERNECTOMY      Current Medications: Current Meds  Medication Sig  . aspirin 81 MG tablet Take 81 mg by mouth daily.   . clopidogrel (PLAVIX) 75 MG tablet Take 75 mg by mouth daily.  . dapagliflozin propanediol (FARXIGA) 10 MG TABS tablet Take 10 mg by mouth at bedtime.  . insulin glargine (LANTUS) 100 UNIT/ML Solostar Pen Inject 70 Units into the skin daily before breakfast.  . metFORMIN (GLUCOPHAGE) 500 MG tablet Take 1 tablet (500 mg total) by mouth 2 (two) times daily with a meal.  .  metoprolol succinate (TOPROL-XL) 50 MG 24 hr tablet TAKE ONE TABLET BY MOUTH ONE TIME DAILY. TAKE WITH OR IMMEDIATELY FOLLOWING A MEAL  . nitroGLYCERIN (NITROSTAT) 0.4 MG SL tablet Place 0.4 mg under the tongue every 5 (five) minutes as needed for chest pain.  . pantoprazole (PROTONIX) 40 MG tablet Take 40 mg by mouth daily before breakfast.  . ranolazine (RANEXA) 500 MG 12 hr tablet TAKE ONE TABLET BY MOUTH TWICE DAILY.PLEASE MAKE APPT WITH DR.HARDING FOR REFILLS.   Marland Kitchen rosuvastatin (CRESTOR) 40 MG tablet Take 1 tablet (40 mg total) by mouth daily.  . Semaglutide, 1 MG/DOSE, (OZEMPIC, 1 MG/DOSE,) 4 MG/3ML SOPN Inject into the skin every Thursday.     Allergies:   Patient has no known allergies.   Social History   Socioeconomic History  . Marital status: Single    Spouse name: Not on file  . Number of children: 0  . Years of education: Not on file  . Highest education level: Not on file  Occupational History  . Occupation: STORE Programme researcher, broadcasting/film/video: [employer]    Comment: [place], [place]  Tobacco Use  . Smoking status: Never Smoker  . Smokeless tobacco: Never Used  Vaping Use  . Vaping Use: Never used  Substance and Sexual Activity  . Alcohol use: No  . Drug use: No  . Sexual activity: Not on file  Other Topics Concern  . 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  ROS:   Please see the history of present illness.     All other systems reviewed and are negative.  EKGs/Labs/Other Studies Reviewed:    The following studies were reviewed today:  EKG:   07/10/20:  SR 1st HB rate 68 iRBBB, LAD Nonspecific TWI  Transthoracic Echocardiogram: Date: 07/06/20 Results:Technical difficult study for LV function Left ventricular ejection fraction, by estimation, is 50 to 55%. Theleft ventricle has low normal function. There is mild left ventricular hypertrophy. Left ventricular diastolic parameters are consistent with  Grade I diastolic dysfunction (impaired relaxation).  2. Right ventricular systolic function is normal. The right ventricular  size is normal.  3. The mitral valve is abnormal. Trivial mitral valve regurgitation.  4. The aortic valve is abnormal.  Aortic valve regurgitation is not  visualized. Mild to moderate aortic valve sclerosis/calcification is  present, without any evidence of aortic stenosis.   NM Stress Testing : Date:02/16/2018 Results:  Nuclear stress EF: 42%.  There was no ST segment deviation noted during stress.  Defect 1: There is a medium defect of severe severity present in the basal anterior, mid anterior, mid anteroseptal, apical anterior and apical septal location.  Findings consistent with prior myocardial infarction with peri-infarct ischemia.  The left ventricular ejection fraction is moderately decreased (30-44%).  This is an intermediate risk study.   Left/Right Heart Catheterizations: Date: 07/06/20 Results: LHC Conclusions: 1. Severe native coronary artery disease, including chronic total occlusions of the ostial LAD and proximal RCA.  Severe diffuse mid/distal LAD and LCx disease is also present. 2. Widely patent LIMA-LAD. 3. Patent SVG-OM2 with 70% proximal graft stenosis and 95% OM2 lesion distal to SVG anastomosis. 4. Known chronic total occlusions of SVG-ramus and SVG-rPDA (not engaged  on today's study). 5. Normal left ventricular filling pressure. 6. Successful PCI to proximal SVG-OM2 using Resolute Onyx 4.0 x 18 mm drug-eluting stent with 0% residual stenosis and TIMI-3 flow. 7. Successful PCI to OM2 using Resolute Onyx 2.0 x 12 mm drug-eluting stent with 0% residual stenosis and TIMI-3 flow.  Recommendations: 1. Overnight post-cath recovery and gentle hydration in the setting of chronic kidney disease. 2. Continue indefinite DAPT with aspirin and clopidogrel. 3. Aggressive secondary prevention and medical management of residual coronary artery disease.  There are no other targets for revascularization.   Recent Labs: 07/07/2020: BUN 27; Creatinine, Ser 2.03; Hemoglobin 11.2; Platelets 183; Potassium 3.7; Sodium 138  Recent Lipid Panel    Component Value Date/Time   CHOL 140 05/26/2018 0813   TRIG 299 (H) 05/26/2018 0813   HDL 26 (L) 05/26/2018 0813   CHOLHDL 5.4 (H) 05/26/2018 0813   CHOLHDL 4.9 09/07/2010 0330   VLDL 41 (H) 09/07/2010 0330   LDLCALC 54 05/26/2018 0813   Risk Assessment/Calculations:     N/A  Physical Exam:    VS:  BP 108/60   Pulse 70   Ht 5\' 6"  (1.676 m)   Wt 170 lb (77.1 kg)   SpO2 94%   BMI 27.44 kg/m     Wt Readings from Last 3 Encounters:  07/26/20 170 lb (77.1 kg)  07/07/20 166 lb 0.1 oz (75.3 kg)  07/05/20 170 lb 12.8 oz (77.5 kg)     GEN:  Well nourished, well developed in no acute distress HEENT: Bilateral Frank's Sign NECK: No JVD; No carotid bruits LYMPHATICS: No lymphadenopathy CARDIAC: RRR, no murmurs, rubs, gallops, left radial site c/d/i without hematoma RESPIRATORY:  Good air movement with faint crackles in the lung bases ABDOMEN: Soft, non-tender, non-distended MUSCULOSKELETAL:  No edema; No deformity  SKIN: Warm and dry NEUROLOGIC:  Alert and oriented x 3 PSYCHIATRIC:  Normal affect   ASSESSMENT:    No diagnosis found. PLAN:    In order of problems listed above:  Coronary Artery Disease;  Obstructive Diabetes with HTN and HLD - asymptomatic - anatomy: distant CABG, CTO ostial LAD and proxi RCA. Severe diffuse mid/distal LAD and LCx disease, patent LIMA-LAD, SVG-OM2 with 70% proximal graft stenosis and 95% OM2 lesion distal to SVG anastomosis., CTO SVG-ramus and SVG-rPDA with 12/21 PCI to proximal SVG-OM2; PCI to OM2) - continue ASA 81 mg; Continue plavix until at least one month before considering temporary cessation for colonoscopy unless urgent - continue statin, goal LDL < 70 - continue  metoprolol succinate 50 mg Po daily - continue nitrates; as PRN; if worsening chest pain will start IMDUR - discussed cardiac rehab patient will hold off at this time  HF NOS CKD STAGE IIIb - will get limited echocardiogram with contrast  Peripheral Arterial Disease - asymptomatic - ASA 81 mg / Plavix 75 mg - Continue statin - No Tobacco Use  - Flu shot Done - will discuss referral and next eval     Three months follow up unless new symptoms or abnormal test results warranting change in plan  Would be reasonable for  APP Follow up   Medication Adjustments/Labs and Tests Ordered: Current medicines are reviewed at length with the patient today.  Concerns regarding medicines are outlined above.  No orders of the defined types were placed in this encounter.  No orders of the defined types were placed in this encounter.   There are no Patient Instructions on file for this visit.   Signed, Werner Lean, MD  07/26/2020 10:19 AM    [place]

## 2020-07-26 NOTE — Patient Instructions (Signed)
Medication Instructions:  Your physician recommends that you continue on your current medications as directed. Please refer to the Current Medication list given to you today.  *If you need a refill on your cardiac medications before your next appointment, please call your pharmacy*  Lab Work: You will have labs drawn today: BNP  Testing/Procedures: Your physician has requested that you have an limited echocardiogram. Echocardiography is a painless test that uses sound waves to create images of your heart. It provides your doctor with information about the size and shape of your heart and how well your heart's chambers and valves are working. This procedure takes approximately one hour. There are no restrictions for this procedure.  Follow-Up: At [facility], you and your health needs are our priority.  As part of our continuing mission to provide you with exceptional heart care, we have created designated Provider Care Teams.  These Care Teams include your primary Cardiologist (physician) and Advanced Practice Providers (APPs -  Physician Assistants and Nurse Practitioners) who all work together to provide you with the care you need, when you need it.  Your next appointment:   3 month(s)  The format for your next appointment:   In Person  Provider:   Rudean Haskell, MD

## 2020-07-27 ENCOUNTER — Telehealth: Payer: Self-pay

## 2020-07-27 DIAGNOSIS — I509 Heart failure, unspecified: Secondary | ICD-10-CM

## 2020-07-27 DIAGNOSIS — I25709 Atherosclerosis of coronary artery bypass graft(s), unspecified, with unspecified angina pectoris: Secondary | ICD-10-CM

## 2020-07-27 LAB — PRO B NATRIURETIC PEPTIDE: NT-Pro BNP: 2139 pg/mL — ABNORMAL HIGH (ref 0–376)

## 2020-07-27 MED ORDER — FUROSEMIDE 20 MG PO TABS
20.0000 mg | ORAL_TABLET | Freq: Every day | ORAL | 3 refills | Status: DC
Start: 2020-07-27 — End: 2020-08-07

## 2020-07-27 NOTE — Telephone Encounter (Signed)
Reviewed results and plan of care with patient agreeable with plan. Advised patient he can complete lab work on 08/04/2020 same day as office visit with Dr. Lovena Le.

## 2020-07-27 NOTE — Telephone Encounter (Signed)
-----   Message from Werner Lean, MD sent at 07/27/2020  8:08 AM EST ----- Results: Elevated BNP with faint crackles on exam Plan: Lasix 20 mg PO with BMP and repeat NT-proBNP in 7-10 days;  Werner Lean, MD

## 2020-08-04 ENCOUNTER — Encounter: Payer: Self-pay | Admitting: Internal Medicine

## 2020-08-04 ENCOUNTER — Other Ambulatory Visit: Payer: Self-pay

## 2020-08-04 ENCOUNTER — Ambulatory Visit: Payer: Medicare HMO | Admitting: Internal Medicine

## 2020-08-04 ENCOUNTER — Other Ambulatory Visit: Payer: Medicare HMO | Admitting: *Deleted

## 2020-08-04 VITALS — BP 112/66 | HR 89 | Ht 66.0 in | Wt 176.8 lb

## 2020-08-04 DIAGNOSIS — I25119 Atherosclerotic heart disease of native coronary artery with unspecified angina pectoris: Secondary | ICD-10-CM

## 2020-08-04 DIAGNOSIS — I509 Heart failure, unspecified: Secondary | ICD-10-CM | POA: Diagnosis not present

## 2020-08-04 DIAGNOSIS — Z95810 Presence of automatic (implantable) cardiac defibrillator: Secondary | ICD-10-CM | POA: Diagnosis not present

## 2020-08-04 DIAGNOSIS — I4901 Ventricular fibrillation: Secondary | ICD-10-CM

## 2020-08-04 DIAGNOSIS — I25709 Atherosclerosis of coronary artery bypass graft(s), unspecified, with unspecified angina pectoris: Secondary | ICD-10-CM

## 2020-08-04 NOTE — Patient Instructions (Signed)
Medication Instructions:  Your physician recommends that you continue on your current medications as directed. Please refer to the Current Medication list given to you today.  Labwork: None ordered.  Testing/Procedures: None ordered.  Follow-Up: Your physician wants you to follow-up in: one year with Cristopher Peru, MD or one of the following Advanced Practice Providers on your designated Care Team:    Chanetta Marshall, NP  Tommye Standard, PA-C  Legrand Como "Jonni Sanger" [place], [place]  Remote monitoring is used to monitor your ICD from home. This monitoring reduces the number of office visits required to check your device to one time per year. It allows [place] to keep an eye on the functioning of your device to ensure it is working properly. You are scheduled for a device check from home on 08/09/2020. You may send your transmission at any time that day. If you have a wireless device, the transmission will be sent automatically. After your physician reviews your transmission, you will receive a postcard with your next transmission date.  Any Other Special Instructions Will Be Listed Below (If Applicable).  If you need a refill on your cardiac medications before your next appointment, please call your pharmacy.

## 2020-08-04 NOTE — Progress Notes (Signed)
HPI Mr. Boy returns today for followup. The patient is  a 65 yo man with a h/o prior VF arrest, CAD, S/p ICD insertion. He has lost 40 lbs in the interim. He has class 2 CHF symptoms. He denies palpitations, syncope, chest pain or sob.   No Known Allergies   Current Outpatient Medications  Medication Sig Dispense Refill  . aspirin 81 MG tablet Take 81 mg by mouth daily.     . clopidogrel (PLAVIX) 75 MG tablet Take 75 mg by mouth daily.    . dapagliflozin propanediol (FARXIGA) 10 MG TABS tablet Take 10 mg by mouth at bedtime.    . furosemide (LASIX) 20 MG tablet Take 1 tablet (20 mg total) by mouth daily. 90 tablet 3  . insulin glargine (LANTUS) 100 UNIT/ML Solostar Pen Inject 70 Units into the skin daily before breakfast.    . metFORMIN (GLUCOPHAGE) 500 MG tablet Take 1 tablet (500 mg total) by mouth 2 (two) times daily with a meal.    . metoprolol succinate (TOPROL-XL) 50 MG 24 hr tablet TAKE ONE TABLET BY MOUTH ONE TIME DAILY. TAKE WITH OR IMMEDIATELY FOLLOWING A MEAL 90 tablet 1  . nitroGLYCERIN (NITROSTAT) 0.4 MG SL tablet Place 0.4 mg under the tongue every 5 (five) minutes as needed for chest pain.    . pantoprazole (PROTONIX) 40 MG tablet Take 40 mg by mouth daily before breakfast.    . ranolazine (RANEXA) 500 MG 12 hr tablet TAKE ONE TABLET BY MOUTH TWICE DAILY.PLEASE MAKE APPT WITH DR.HARDING FOR REFILLS.  60 tablet 2  . rosuvastatin (CRESTOR) 40 MG tablet Take 1 tablet (40 mg total) by mouth daily. 90 tablet 3  . Semaglutide, 1 MG/DOSE, (OZEMPIC, 1 MG/DOSE,) 4 MG/3ML SOPN Inject into the skin every Thursday.     No current facility-administered medications for this visit.     Past Medical History:  Diagnosis Date  . Abnormal nuclear stress test 02/19/2018  . Angina, class III ([place]) 02/19/2018  . Anxiety   . Asthma   . CHF (congestive heart failure) ([place])   . Coronary artery disease   . DDD (degenerative disc disease), cervical 03/01/2013  . Diabetes mellitus     type 2  . 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 Dyslipidemia   . Hypertension   . Lumbosacral radiculopathy at L4 02/18/2017  . Myocardial infarction ([place]) 2012  . Obesity   . Sleep apnea 08/17/2013  . Snoring 10/12/2013  . TIA (transient ischemic attack) 08/09/2014  . Tubular adenoma of colon 01/12/2014   transverse colon  . VF (ventricular fibrillation) ( [place])    arrest  . Vitamin D deficiency 02/18/2017    ROS:   All systems reviewed and negative except as noted in the HPI.   Past Surgical History:  Procedure Laterality Date  . ABDOMINAL AORTAGRAM N/A 12/14/2013   Procedure: ABDOMINAL Maxcine Ham;  Surgeon: [name], MD;  Location: [facility] CATH LAB;  Service: Cardiovascular;  Laterality: N/A;  . CARDIAC CATHETERIZATION  09/16/10  . COLONOSCOPY  01/12/2014  . CORONARY ARTERY BYPASS GRAFT  2006  . CORONARY STENT INTERVENTION  07/06/2020  . CORONARY STENT INTERVENTION N/A 07/06/2020   Procedure: CORONARY STENT INTERVENTION;  Surgeon: Nelva Bush, MD;  Location: [place] CV LAB;  Service: Cardiovascular;  Laterality: N/A;  . IMPLANTABLE CARDIOVERTER DEFIBRILLATOR IMPLANT  2012   STJ ICD- single chamber  . LEFT HEART CATH AND CORS/GRAFTS ANGIOGRAPHY N/A 02/19/2018   Procedure: LEFT HEART CATH  AND CORS/GRAFTS ANGIOGRAPHY;  Surgeon: [name], MD;  Location: [place] CV LAB;  Service: Cardiovascular;  Laterality: N/A;  . LEFT HEART CATH AND CORS/GRAFTS ANGIOGRAPHY N/A 07/06/2020   Procedure: LEFT HEART CATH AND CORS/GRAFTS ANGIOGRAPHY;  Surgeon: Nelva Bush, MD;  Location: [place] CV LAB;  Service: Cardiovascular;  Laterality: N/A;  . LOOP RECORDER IMPLANT N/A 08/24/2014   Procedure: LOOP RECORDER IMPLANT;  Surgeon: [name], MD;  Location: [facility] CATH LAB;  Service: Cardiovascular;  Laterality: N/A;  . MASS EXCISION  2008   right shoulder  . STERNECTOMY       Family History  Problem Relation Age of Onset  . Coronary artery disease Father        early  23's  . Diabetes Father   . Hypertension Father   . Breast cancer Mother 77       mets to colon and other  . Glaucoma Mother   . Prostate cancer Brother   . Heart disease Brother   . Diabetes Sister      Social History   Socioeconomic History  . Marital status: Single    Spouse name: Not on file  . Number of children: 0  . Years of education: Not on file  . Highest education level: Not on file  Occupational History  . Occupation: STORE Programme researcher, broadcasting/film/video: [employer]    Comment: [place], [place]  Tobacco Use  . Smoking status: Never Smoker  . Smokeless tobacco: Never Used  Vaping Use  . Vaping Use: Never used  Substance and Sexual Activity  . Alcohol use: No  . Drug use: No  . Sexual activity: Not on file  Other Topics Concern  . 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[place], [place]   6 caffeinated beverages daily   Social Determinants of Health   Financial Resource Strain: Not on file  Food Insecurity: Not on file  Transportation Needs: Not on file  Physical Activity: Not on file  Stress: Not on file  Social Connections: Not on file  Intimate Partner Violence: Not on file     BP 112/66   Pulse 89   Ht 5\' 6"  (1.676 m)   Wt 176 lb 12.8 oz (80.2 kg)   SpO2 90%   BMI 28.54 kg/m   Physical Exam:  Well appearing NAD HEENT: Unremarkable Neck:  No JVD, no thyromegally Lymphatics:  No adenopathy Back:  No CVA tenderness Lungs:  Clear with no wheezes HEART:  Regular rate rhythm, no murmurs, no rubs, no clicks Abd:  soft, positive bowel sounds, no organomegally, no rebound, no guarding Ext:  2 plus pulses, no edema, no cyanosis, no clubbing Skin:  No rashes no nodules Neuro:  CN II through XII intact, motor grossly intact  DEVICE  Normal device function.  See PaceArt for details.   Assess/Plan: 1. VF arrest - he has had no recurrent ventricular arrhythmias in the past year. No change in  meds. 2. ICD - his St. Jude single chamber ICD is working normally. He still has over 2 years of battery longevity. 3. Obesity - he has lost over 40 lbs.  4. CAD - he is s/p CABG in the past. He denies anginal symptoms.   Carleene Overlie Muhannad Bignell,MD

## 2020-08-05 LAB — BASIC METABOLIC PANEL
BUN/Creatinine Ratio: 14 (ref 10–24)
BUN: 31 mg/dL — ABNORMAL HIGH (ref 8–27)
CO2: 21 mmol/L (ref 20–29)
Calcium: 9.4 mg/dL (ref 8.6–10.2)
Chloride: 103 mmol/L (ref 96–106)
Creatinine, Ser: 2.27 mg/dL — ABNORMAL HIGH (ref 0.76–1.27)
GFR calc Af Amer: 34 mL/min/{1.73_m2} — ABNORMAL LOW (ref >59)
GFR calc non Af Amer: 29 mL/min/{1.73_m2} — ABNORMAL LOW (ref >59)
Glucose: 131 mg/dL — ABNORMAL HIGH (ref 65–99)
Potassium: 3.9 mmol/L (ref 3.5–5.2)
Sodium: 139 mmol/L (ref 134–144)

## 2020-08-05 LAB — PRO B NATRIURETIC PEPTIDE: NT-Pro BNP: 2329 pg/mL — ABNORMAL HIGH (ref 0–376)

## 2020-08-07 ENCOUNTER — Telehealth: Payer: Self-pay

## 2020-08-07 DIAGNOSIS — I509 Heart failure, unspecified: Secondary | ICD-10-CM

## 2020-08-07 MED ORDER — FUROSEMIDE 40 MG PO TABS: 40.0000 mg | ORAL_TABLET | Freq: Every day | ORAL | 3 refills | Status: DC

## 2020-08-07 NOTE — Telephone Encounter (Addendum)
-----   Message from Werner Lean, MD sent at 08/06/2020 10:00 AM EST ----- Results: Elevation of BNP that continues to increase Plan: Would increase lasix to 40 mg; this may increase his kidney function slightly  Werner Lean, MD  Also-repeat repeat BMP, Mg in 7-10 days

## 2020-08-07 NOTE — Telephone Encounter (Signed)
Detailed message left per DPR.  Advised Pt to increase lasix to 40 mg by mouth daily.  Advised will need repeat lab work in 7-10 days.  Orders entered.  Advised Pt to call office to schedule lab work.

## 2020-08-08 NOTE — Telephone Encounter (Signed)
Per review of Pt's chart, Pt returned call 08/07/20 and was scheduled for repeat lab work.  No further action needed.

## 2020-08-09 ENCOUNTER — Ambulatory Visit (INDEPENDENT_AMBULATORY_CARE_PROVIDER_SITE_OTHER): Payer: Medicare HMO

## 2020-08-09 DIAGNOSIS — I4901 Ventricular fibrillation: Secondary | ICD-10-CM | POA: Diagnosis not present

## 2020-08-09 LAB — CUP PACEART REMOTE DEVICE CHECK
Battery Remaining Longevity: 28 mo
Battery Remaining Percentage: 24 %
Battery Voltage: 2.8 V
Brady Statistic RV Percent Paced: 0 %
Date Time Interrogation Session: 20220112020016
HighPow Impedance: 71 Ohm
HighPow Impedance: 71 Ohm
Implantable Lead Implant Date: 20120214
Implantable Lead Location: 753860
Implantable Lead Model: 7122
Implantable Pulse Generator Implant Date: 20120214
Lead Channel Impedance Value: 480 Ohm
Lead Channel Pacing Threshold Amplitude: 0.75 V
Lead Channel Pacing Threshold Pulse Width: 0.5 ms
Lead Channel Sensing Intrinsic Amplitude: 11.7 mV
Lead Channel Setting Pacing Amplitude: 2.5 V
Lead Channel Setting Pacing Pulse Width: 0.5 ms
Lead Channel Setting Sensing Sensitivity: 0.5 mV
Pulse Gen Serial Number: 819791

## 2020-08-11 DIAGNOSIS — Z794 Long term (current) use of insulin: Secondary | ICD-10-CM | POA: Diagnosis not present

## 2020-08-11 DIAGNOSIS — E785 Hyperlipidemia, unspecified: Secondary | ICD-10-CM | POA: Diagnosis not present

## 2020-08-11 DIAGNOSIS — Z95810 Presence of automatic (implantable) cardiac defibrillator: Secondary | ICD-10-CM | POA: Diagnosis not present

## 2020-08-11 DIAGNOSIS — M5387 Other specified dorsopathies, lumbosacral region: Secondary | ICD-10-CM | POA: Diagnosis not present

## 2020-08-11 DIAGNOSIS — E0821 Diabetes mellitus due to underlying condition with diabetic nephropathy: Secondary | ICD-10-CM | POA: Diagnosis not present

## 2020-08-11 DIAGNOSIS — E1159 Type 2 diabetes mellitus with other circulatory complications: Secondary | ICD-10-CM | POA: Diagnosis not present

## 2020-08-11 DIAGNOSIS — E1351 Other specified diabetes mellitus with diabetic peripheral angiopathy without gangrene: Secondary | ICD-10-CM | POA: Diagnosis not present

## 2020-08-11 DIAGNOSIS — I2510 Atherosclerotic heart disease of native coronary artery without angina pectoris: Secondary | ICD-10-CM | POA: Diagnosis not present

## 2020-08-11 DIAGNOSIS — N184 Chronic kidney disease, stage 4 (severe): Secondary | ICD-10-CM | POA: Diagnosis not present

## 2020-08-15 ENCOUNTER — Other Ambulatory Visit: Payer: Medicare HMO

## 2020-08-16 ENCOUNTER — Other Ambulatory Visit: Payer: Self-pay

## 2020-08-16 ENCOUNTER — Other Ambulatory Visit: Payer: Medicare HMO | Admitting: *Deleted

## 2020-08-16 ENCOUNTER — Ambulatory Visit (HOSPITAL_COMMUNITY): Payer: Medicare HMO | Attending: Cardiovascular Disease

## 2020-08-16 DIAGNOSIS — I509 Heart failure, unspecified: Secondary | ICD-10-CM | POA: Diagnosis not present

## 2020-08-16 LAB — ECHOCARDIOGRAM LIMITED
Area-P 1/2: 3.28 cm2
S' Lateral: 2.9 cm

## 2020-08-16 MED ORDER — PERFLUTREN LIPID MICROSPHERE
1.0000 mL | INTRAVENOUS | Status: AC | PRN
  Administered 2020-08-16: 1 mL via INTRAVENOUS

## 2020-08-17 ENCOUNTER — Telehealth: Payer: Self-pay

## 2020-08-17 ENCOUNTER — Telehealth (HOSPITAL_COMMUNITY): Payer: Self-pay | Admitting: *Deleted

## 2020-08-17 DIAGNOSIS — I509 Heart failure, unspecified: Secondary | ICD-10-CM

## 2020-08-17 DIAGNOSIS — Z79899 Other long term (current) drug therapy: Secondary | ICD-10-CM

## 2020-08-17 LAB — BASIC METABOLIC PANEL
BUN/Creatinine Ratio: 15 (ref 10–24)
BUN: 42 mg/dL — ABNORMAL HIGH (ref 8–27)
CO2: 21 mmol/L (ref 20–29)
Calcium: 9.9 mg/dL (ref 8.6–10.2)
Chloride: 97 mmol/L (ref 96–106)
Creatinine, Ser: 2.76 mg/dL — ABNORMAL HIGH (ref 0.76–1.27)
GFR calc Af Amer: 27 mL/min/{1.73_m2} — ABNORMAL LOW (ref >59)
GFR calc non Af Amer: 23 mL/min/{1.73_m2} — ABNORMAL LOW (ref >59)
Glucose: 195 mg/dL — ABNORMAL HIGH (ref 65–99)
Potassium: 4.5 mmol/L (ref 3.5–5.2)
Sodium: 135 mmol/L (ref 134–144)

## 2020-08-17 LAB — MAGNESIUM: Magnesium: 2.2 mg/dL (ref 1.6–2.3)

## 2020-08-17 NOTE — Telephone Encounter (Signed)
-----   Message from Werner Lean, MD sent at 08/16/2020  5:37 PM EST ----- Regarding: RE: Readiness for Cardiac rehab Reasonable to proceed- not sure if he will do it ----- Message ----- From: Rowe Pavy, RN Sent: 08/16/2020   5:17 PM EST To: Werner Lean, MD Subject: Readiness for Cardiac rehab                     Dr. [place]per Sells,  The above pt referred to cardiac rehab.  Reviewed your progress note from 12/29.  At that time, the patient was not quite ready yet.  Seen in follow up on 1/7 with Dr. Lovena Le and completed echo today.  Based upon the follow up and results of the echo, okay to proceed with scheduling him for cardiac rehab?   Thanks Psychologist, clinical, BSN Cardiac and Training and development officer

## 2020-08-17 NOTE — Telephone Encounter (Signed)
-----   Message from Werner Lean, MD sent at 08/17/2020 11:57 AM EST ----- Results: < 20 % decrease in GFR with stable potassium and magnesium on PO lasix Plan: Repeat BMP in 2-3 weeks to confirm stable labs with diuresis  Werner Lean, MD

## 2020-08-17 NOTE — Telephone Encounter (Signed)
The patient has been notified of the result and verbalized understanding.  All questions (if any) were answered. Michaelyn Barter, RN 08/17/2020 12:43 PM   Patient will come in on 09/06/20 for repeat BMET.

## 2020-08-22 NOTE — Progress Notes (Signed)
Remote ICD transmission.   

## 2020-08-29 ENCOUNTER — Telehealth (HOSPITAL_COMMUNITY): Payer: Self-pay

## 2020-08-29 NOTE — Telephone Encounter (Signed)
Called and spoke with pt in regards to CR, pt stated he has a lot of things going on right now. Unable to participate at this time.  Closed referral

## 2020-08-30 ENCOUNTER — Other Ambulatory Visit: Payer: Self-pay | Admitting: Internal Medicine

## 2020-08-30 DIAGNOSIS — E782 Mixed hyperlipidemia: Secondary | ICD-10-CM

## 2020-09-06 ENCOUNTER — Other Ambulatory Visit: Payer: Medicare HMO

## 2020-09-06 ENCOUNTER — Other Ambulatory Visit: Payer: Self-pay

## 2020-09-06 DIAGNOSIS — I509 Heart failure, unspecified: Secondary | ICD-10-CM

## 2020-09-06 DIAGNOSIS — Z79899 Other long term (current) drug therapy: Secondary | ICD-10-CM | POA: Diagnosis not present

## 2020-09-07 LAB — BASIC METABOLIC PANEL
BUN/Creatinine Ratio: 15 (ref 10–24)
BUN: 41 mg/dL — ABNORMAL HIGH (ref 8–27)
CO2: 18 mmol/L — ABNORMAL LOW (ref 20–29)
Calcium: 9.4 mg/dL (ref 8.6–10.2)
Chloride: 95 mmol/L — ABNORMAL LOW (ref 96–106)
Creatinine, Ser: 2.74 mg/dL — ABNORMAL HIGH (ref 0.76–1.27)
GFR calc Af Amer: 27 mL/min/{1.73_m2} — ABNORMAL LOW (ref >59)
GFR calc non Af Amer: 23 mL/min/{1.73_m2} — ABNORMAL LOW (ref >59)
Glucose: 274 mg/dL — ABNORMAL HIGH (ref 65–99)
Potassium: 4.2 mmol/L (ref 3.5–5.2)
Sodium: 135 mmol/L (ref 134–144)

## 2020-09-08 ENCOUNTER — Encounter (HOSPITAL_COMMUNITY): Payer: Self-pay | Admitting: Emergency Medicine

## 2020-09-08 ENCOUNTER — Emergency Department (HOSPITAL_COMMUNITY): Payer: Medicare HMO

## 2020-09-08 ENCOUNTER — Other Ambulatory Visit: Payer: Self-pay

## 2020-09-08 ENCOUNTER — Inpatient Hospital Stay (HOSPITAL_COMMUNITY)
Admission: EM | Admit: 2020-09-08 | Discharge: 2020-09-16 | DRG: 250 | Disposition: A | Discharge status: Home Health | Payer: Medicare HMO | Attending: Internal Medicine | Admitting: Internal Medicine

## 2020-09-08 DIAGNOSIS — I82451 Acute embolism and thrombosis of right peroneal vein: Secondary | ICD-10-CM | POA: Diagnosis present

## 2020-09-08 DIAGNOSIS — I25709 Atherosclerosis of coronary artery bypass graft(s), unspecified, with unspecified angina pectoris: Secondary | ICD-10-CM | POA: Diagnosis present

## 2020-09-08 DIAGNOSIS — R634 Abnormal weight loss: Secondary | ICD-10-CM | POA: Diagnosis present

## 2020-09-08 DIAGNOSIS — I672 Cerebral atherosclerosis: Secondary | ICD-10-CM | POA: Diagnosis not present

## 2020-09-08 DIAGNOSIS — I4581 Long QT syndrome: Secondary | ICD-10-CM | POA: Diagnosis present

## 2020-09-08 DIAGNOSIS — I82461 Acute embolism and thrombosis of right calf muscular vein: Secondary | ICD-10-CM | POA: Diagnosis present

## 2020-09-08 DIAGNOSIS — J8410 Pulmonary fibrosis, unspecified: Secondary | ICD-10-CM | POA: Diagnosis present

## 2020-09-08 DIAGNOSIS — R079 Chest pain, unspecified: Secondary | ICD-10-CM

## 2020-09-08 DIAGNOSIS — E1159 Type 2 diabetes mellitus with other circulatory complications: Secondary | ICD-10-CM | POA: Diagnosis present

## 2020-09-08 DIAGNOSIS — J849 Interstitial pulmonary disease, unspecified: Secondary | ICD-10-CM

## 2020-09-08 DIAGNOSIS — R9431 Abnormal electrocardiogram [ECG] [EKG]: Secondary | ICD-10-CM

## 2020-09-08 DIAGNOSIS — N184 Chronic kidney disease, stage 4 (severe): Secondary | ICD-10-CM | POA: Diagnosis present

## 2020-09-08 DIAGNOSIS — J432 Centrilobular emphysema: Secondary | ICD-10-CM | POA: Diagnosis not present

## 2020-09-08 DIAGNOSIS — E1151 Type 2 diabetes mellitus with diabetic peripheral angiopathy without gangrene: Secondary | ICD-10-CM | POA: Diagnosis present

## 2020-09-08 DIAGNOSIS — R531 Weakness: Principal | ICD-10-CM

## 2020-09-08 DIAGNOSIS — U071 COVID-19: Secondary | ICD-10-CM | POA: Diagnosis present

## 2020-09-08 DIAGNOSIS — E1122 Type 2 diabetes mellitus with diabetic chronic kidney disease: Secondary | ICD-10-CM | POA: Diagnosis present

## 2020-09-08 DIAGNOSIS — I25719 Atherosclerosis of autologous vein coronary artery bypass graft(s) with unspecified angina pectoris: Secondary | ICD-10-CM | POA: Diagnosis present

## 2020-09-08 DIAGNOSIS — R7989 Other specified abnormal findings of blood chemistry: Secondary | ICD-10-CM

## 2020-09-08 DIAGNOSIS — G729 Myopathy, unspecified: Secondary | ICD-10-CM | POA: Diagnosis not present

## 2020-09-08 DIAGNOSIS — M6281 Muscle weakness (generalized): Secondary | ICD-10-CM | POA: Diagnosis not present

## 2020-09-08 DIAGNOSIS — I5032 Chronic diastolic (congestive) heart failure: Secondary | ICD-10-CM | POA: Diagnosis present

## 2020-09-08 DIAGNOSIS — I214 Non-ST elevation (NSTEMI) myocardial infarction: Secondary | ICD-10-CM | POA: Diagnosis present

## 2020-09-08 DIAGNOSIS — Z95810 Presence of automatic (implantable) cardiac defibrillator: Secondary | ICD-10-CM | POA: Diagnosis present

## 2020-09-08 DIAGNOSIS — R3912 Poor urinary stream: Secondary | ICD-10-CM

## 2020-09-08 DIAGNOSIS — E876 Hypokalemia: Secondary | ICD-10-CM | POA: Diagnosis present

## 2020-09-08 DIAGNOSIS — E785 Hyperlipidemia, unspecified: Secondary | ICD-10-CM | POA: Diagnosis present

## 2020-09-08 DIAGNOSIS — I2510 Atherosclerotic heart disease of native coronary artery without angina pectoris: Secondary | ICD-10-CM | POA: Diagnosis present

## 2020-09-08 DIAGNOSIS — Z8674 Personal history of sudden cardiac arrest: Secondary | ICD-10-CM | POA: Diagnosis not present

## 2020-09-08 DIAGNOSIS — E119 Type 2 diabetes mellitus without complications: Secondary | ICD-10-CM | POA: Diagnosis not present

## 2020-09-08 DIAGNOSIS — I25810 Atherosclerosis of coronary artery bypass graft(s) without angina pectoris: Secondary | ICD-10-CM | POA: Diagnosis not present

## 2020-09-08 DIAGNOSIS — Y712 Prosthetic and other implants, materials and accessory cardiovascular devices associated with adverse incidents: Secondary | ICD-10-CM | POA: Diagnosis present

## 2020-09-08 DIAGNOSIS — B961 Klebsiella pneumoniae [K. pneumoniae] as the cause of diseases classified elsewhere: Secondary | ICD-10-CM | POA: Diagnosis present

## 2020-09-08 DIAGNOSIS — I130 Hypertensive heart and chronic kidney disease with heart failure and stage 1 through stage 4 chronic kidney disease, or unspecified chronic kidney disease: Secondary | ICD-10-CM | POA: Diagnosis present

## 2020-09-08 DIAGNOSIS — M62552 Muscle wasting and atrophy, not elsewhere classified, left thigh: Secondary | ICD-10-CM | POA: Diagnosis present

## 2020-09-08 DIAGNOSIS — Z79899 Other long term (current) drug therapy: Secondary | ICD-10-CM

## 2020-09-08 DIAGNOSIS — M62551 Muscle wasting and atrophy, not elsewhere classified, right thigh: Secondary | ICD-10-CM | POA: Diagnosis present

## 2020-09-08 DIAGNOSIS — Z7984 Long term (current) use of oral hypoglycemic drugs: Secondary | ICD-10-CM

## 2020-09-08 DIAGNOSIS — I4901 Ventricular fibrillation: Secondary | ICD-10-CM | POA: Diagnosis present

## 2020-09-08 DIAGNOSIS — N3090 Cystitis, unspecified without hematuria: Secondary | ICD-10-CM | POA: Diagnosis not present

## 2020-09-08 DIAGNOSIS — O2230 Deep phlebothrombosis in pregnancy, unspecified trimester: Secondary | ICD-10-CM

## 2020-09-08 DIAGNOSIS — E669 Obesity, unspecified: Secondary | ICD-10-CM | POA: Diagnosis present

## 2020-09-08 DIAGNOSIS — Z6826 Body mass index (BMI) 26.0-26.9, adult: Secondary | ICD-10-CM

## 2020-09-08 DIAGNOSIS — Z8673 Personal history of transient ischemic attack (TIA), and cerebral infarction without residual deficits: Secondary | ICD-10-CM

## 2020-09-08 DIAGNOSIS — Z8249 Family history of ischemic heart disease and other diseases of the circulatory system: Secondary | ICD-10-CM

## 2020-09-08 DIAGNOSIS — N390 Urinary tract infection, site not specified: Secondary | ICD-10-CM | POA: Diagnosis present

## 2020-09-08 DIAGNOSIS — N1832 Chronic kidney disease, stage 3b: Secondary | ICD-10-CM

## 2020-09-08 DIAGNOSIS — R0789 Other chest pain: Secondary | ICD-10-CM | POA: Diagnosis not present

## 2020-09-08 DIAGNOSIS — N3000 Acute cystitis without hematuria: Secondary | ICD-10-CM | POA: Diagnosis not present

## 2020-09-08 DIAGNOSIS — R29818 Other symptoms and signs involving the nervous system: Secondary | ICD-10-CM | POA: Diagnosis not present

## 2020-09-08 DIAGNOSIS — I82431 Acute embolism and thrombosis of right popliteal vein: Secondary | ICD-10-CM | POA: Diagnosis present

## 2020-09-08 DIAGNOSIS — I480 Paroxysmal atrial fibrillation: Secondary | ICD-10-CM | POA: Diagnosis not present

## 2020-09-08 DIAGNOSIS — I4891 Unspecified atrial fibrillation: Secondary | ICD-10-CM | POA: Diagnosis not present

## 2020-09-08 DIAGNOSIS — E538 Deficiency of other specified B group vitamins: Secondary | ICD-10-CM | POA: Diagnosis present

## 2020-09-08 DIAGNOSIS — Z7902 Long term (current) use of antithrombotics/antiplatelets: Secondary | ICD-10-CM

## 2020-09-08 DIAGNOSIS — N3080 Other cystitis without hematuria: Secondary | ICD-10-CM | POA: Diagnosis not present

## 2020-09-08 DIAGNOSIS — M6282 Rhabdomyolysis: Secondary | ICD-10-CM | POA: Diagnosis present

## 2020-09-08 DIAGNOSIS — T82855A Stenosis of coronary artery stent, initial encounter: Secondary | ICD-10-CM | POA: Diagnosis present

## 2020-09-08 DIAGNOSIS — E559 Vitamin D deficiency, unspecified: Secondary | ICD-10-CM | POA: Diagnosis present

## 2020-09-08 DIAGNOSIS — Z83511 Family history of glaucoma: Secondary | ICD-10-CM

## 2020-09-08 DIAGNOSIS — Z7982 Long term (current) use of aspirin: Secondary | ICD-10-CM

## 2020-09-08 DIAGNOSIS — I252 Old myocardial infarction: Secondary | ICD-10-CM

## 2020-09-08 DIAGNOSIS — Z8042 Family history of malignant neoplasm of prostate: Secondary | ICD-10-CM

## 2020-09-08 DIAGNOSIS — Z9861 Coronary angioplasty status: Secondary | ICD-10-CM

## 2020-09-08 DIAGNOSIS — N179 Acute kidney failure, unspecified: Secondary | ICD-10-CM | POA: Diagnosis present

## 2020-09-08 DIAGNOSIS — R29898 Other symptoms and signs involving the musculoskeletal system: Secondary | ICD-10-CM

## 2020-09-08 DIAGNOSIS — R0902 Hypoxemia: Secondary | ICD-10-CM | POA: Diagnosis not present

## 2020-09-08 DIAGNOSIS — I213 ST elevation (STEMI) myocardial infarction of unspecified site: Secondary | ICD-10-CM | POA: Diagnosis not present

## 2020-09-08 DIAGNOSIS — Z794 Long term (current) use of insulin: Secondary | ICD-10-CM

## 2020-09-08 DIAGNOSIS — J479 Bronchiectasis, uncomplicated: Secondary | ICD-10-CM | POA: Diagnosis not present

## 2020-09-08 DIAGNOSIS — I959 Hypotension, unspecified: Secondary | ICD-10-CM | POA: Diagnosis not present

## 2020-09-08 DIAGNOSIS — Z833 Family history of diabetes mellitus: Secondary | ICD-10-CM

## 2020-09-08 HISTORY — DX: Deep phlebothrombosis in pregnancy, unspecified trimester: O22.30

## 2020-09-08 LAB — CK: Total CK: 8423 U/L — ABNORMAL HIGH (ref 49–397)

## 2020-09-08 LAB — COMPREHENSIVE METABOLIC PANEL
ALT: 173 U/L — ABNORMAL HIGH (ref 0–44)
AST: 208 U/L — ABNORMAL HIGH (ref 15–41)
Albumin: 3.1 g/dL — ABNORMAL LOW (ref 3.5–5.0)
Alkaline Phosphatase: 58 U/L (ref 38–126)
Anion gap: 16 — ABNORMAL HIGH (ref 5–15)
BUN: 55 mg/dL — ABNORMAL HIGH (ref 8–23)
CO2: 19 mmol/L — ABNORMAL LOW (ref 22–32)
Calcium: 9.2 mg/dL (ref 8.9–10.3)
Chloride: 102 mmol/L (ref 98–111)
Creatinine, Ser: 2.8 mg/dL — ABNORMAL HIGH (ref 0.61–1.24)
GFR, Estimated: 24 mL/min — ABNORMAL LOW (ref >60)
Glucose, Bld: 134 mg/dL — ABNORMAL HIGH (ref 70–99)
Potassium: 3.5 mmol/L (ref 3.5–5.1)
Sodium: 137 mmol/L (ref 135–145)
Total Bilirubin: 1.6 mg/dL — ABNORMAL HIGH (ref 0.3–1.2)
Total Protein: 7 g/dL (ref 6.5–8.1)

## 2020-09-08 LAB — CBC WITH DIFFERENTIAL/PLATELET
Abs Immature Granulocytes: 0.04 10*3/uL (ref 0.00–0.07)
Basophils Absolute: 0 10*3/uL (ref 0.0–0.1)
Basophils Relative: 0 %
Eosinophils Absolute: 0.1 10*3/uL (ref 0.0–0.5)
Eosinophils Relative: 1 %
HCT: 39.9 % (ref 39.0–52.0)
Hemoglobin: 13 g/dL (ref 13.0–17.0)
Immature Granulocytes: 0 %
Lymphocytes Relative: 18 %
Lymphs Abs: 2.3 10*3/uL (ref 0.7–4.0)
MCH: 28.3 pg (ref 26.0–34.0)
MCHC: 32.6 g/dL (ref 30.0–36.0)
MCV: 86.9 fL (ref 80.0–100.0)
Monocytes Absolute: 1.2 10*3/uL — ABNORMAL HIGH (ref 0.1–1.0)
Monocytes Relative: 9 %
Neutro Abs: 9.4 10*3/uL — ABNORMAL HIGH (ref 1.7–7.7)
Neutrophils Relative %: 72 %
Platelets: 177 10*3/uL (ref 150–400)
RBC: 4.59 MIL/uL (ref 4.22–5.81)
RDW: 14.3 % (ref 11.5–15.5)
WBC: 13.2 10*3/uL — ABNORMAL HIGH (ref 4.0–10.5)
nRBC: 0 % (ref 0.0–0.2)

## 2020-09-08 LAB — TROPONIN I (HIGH SENSITIVITY)
Troponin I (High Sensitivity): 10598 ng/L (ref <18)
Troponin I (High Sensitivity): 13971 ng/L (ref <18)

## 2020-09-08 MED ORDER — HEPARIN (PORCINE) 25000 UT/250ML-% IV SOLN
1500.0000 [IU]/h | INTRAVENOUS | Status: DC
  Administered 2020-09-08: 21:00:00 900 [IU]/h via INTRAVENOUS
  Administered 2020-09-10 – 2020-09-11 (×2): 1300 [IU]/h via INTRAVENOUS
  Administered 2020-09-11: 16:00:00 1450 [IU]/h via INTRAVENOUS
  Administered 2020-09-12: 1500 [IU]/h via INTRAVENOUS
  Filled 2020-09-08 (×6): qty 250

## 2020-09-08 MED ORDER — ASPIRIN 81 MG PO CHEW
324.0000 mg | CHEWABLE_TABLET | Freq: Once | ORAL | Status: AC
  Administered 2020-09-08: 324 mg via ORAL

## 2020-09-08 MED ORDER — CLOPIDOGREL BISULFATE 300 MG PO TABS
300.0000 mg | ORAL_TABLET | Freq: Once | ORAL | Status: AC
  Administered 2020-09-08: 300 mg via ORAL

## 2020-09-08 MED ORDER — LACTATED RINGERS IV BOLUS
1000.0000 mL | Freq: Once | INTRAVENOUS | Status: AC
  Administered 2020-09-09: 1000 mL via INTRAVENOUS

## 2020-09-08 MED ORDER — HEPARIN BOLUS VIA INFUSION
4000.0000 [IU] | Freq: Once | INTRAVENOUS | Status: AC
  Administered 2020-09-08: 4000 [IU] via INTRAVENOUS
  Filled 2020-09-08: qty 4000

## 2020-09-08 NOTE — Progress Notes (Signed)
ANTICOAGULATION CONSULT NOTE - Initial Consult  Pharmacy Consult for Heparin Indication: chest pain/ACS  No Known Allergies  Patient Measurements: Height: 5\' 6"  (167.6 cm) Weight: 73.9 kg (163 lb) IBW/kg (Calculated) : 63.8 Heparin Dosing Weight: 73.9 kg  Vital Signs: Temp: 98.1 F (36.7 C) (02/11 1809) Temp Source: Oral (02/11 1809) BP: 99/65 (02/11 2000) Pulse Rate: 86 (02/11 2000)  Labs: Recent Labs    09/06/20 0720 09/08/20 1823  HGB  --  13.0  HCT  --  39.9  PLT  --  177  CREATININE 2.74* 2.80*  TROPONINIHS  --  10,598*    Estimated Creatinine Clearance: 23.7 mL/min (A) (by C-G formula based on SCr of 2.8 mg/dL (H)).   Medical History: Past Medical History:  Diagnosis Date  . Abnormal nuclear stress test 02/19/2018  . Angina, class III ([place]) 02/19/2018  . Anxiety   . Asthma   . CHF (congestive heart failure) ([place])   . Coronary artery disease   . DDD (degenerative disc disease), cervical 03/01/2013  . Diabetes mellitus    type 2  . 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 Dyslipidemia   . Hypertension   . Lumbosacral radiculopathy at L4 02/18/2017  . Myocardial infarction ([place]) 2012  . Obesity   . Sleep apnea 08/17/2013  . Snoring 10/12/2013  . TIA (transient ischemic attack) 08/09/2014  . Tubular adenoma of colon 01/12/2014   transverse colon  . VF (ventricular fibrillation) ([place])    arrest  . Vitamin D deficiency 02/18/2017     Assessment: 65 yo M presents with chest pain. Hx of prior coronary stents. Pharmacy asked to start IV heparin. No AC noted PTA. CBC wnl.  Goal of Therapy:  Heparin level 0.3-0.7 units/ml Monitor platelets by anticoagulation protocol: Yes   Plan:  Give IV heparin 4000 unit bolus Start heparin infusion at 900 units/hr Check 8-hr HL Monitor daily HL, CBC, s/sx bleeding   Richardine Service, PharmD, BCPS PGY2 Cardiology Pharmacy Resident Phone: [phone] 09/08/2020  8:21 PM  Please check [email or web address] for unit-specific pharmacy  phone numbers.

## 2020-09-08 NOTE — ED Provider Notes (Addendum)
[facility] EMERGENCY DEPARTMENT Provider Note   CSN: [id number] Arrival date & time: 09/08/20  1801     History Chief Complaint  Patient presents with  . Weakness    Eugene [name] is a 65 y.o. male.  HPI   Patient is a 65 year old male with a history of CHF, CAD, diabetes mellitus, obesity, V. fib arrest, status post ICD insertion.  Presents the emergency department due to chest pain, shortness of breath, lower extremity weakness.  Patient states that about 2 months ago he had to new cardiac stents placed.  He states that for about 1 month he was no longer experiencing any chest pain or shortness of breath.  About 2 weeks ago his symptoms began once again recur.  He states about 6 days ago he felt as if he was having daily, intermittent, central chest pain that was similar to prior episodes.  Has been intermittently taking nitroglycerin which provides relief.  Reports associated shortness of breath that worsens with exertion.  No leg swelling.  States he has been compliant with all of his medications.  Lastly, patient notes diffuse leg pain for the past few days.  States it worsens with any movement of the lower extremities.  Unsure of the source of his pain.  Denies any back pain.  No injury.  Also reports associated weakness in the lower legs.  States that he has having difficulty now standing and ambulating due to pain and weakness. Upon further discussion feels that he has been having unexplained 60lb weight loss over the past couple of months.      Past Medical History:  Diagnosis Date  . Abnormal nuclear stress test 02/19/2018  . Angina, class III ([place]) 02/19/2018  . Anxiety   . Asthma   . CHF (congestive heart failure) ([place])   . Coronary artery disease   . DDD (degenerative disc disease), cervical 03/01/2013  . Diabetes mellitus    type 2  . 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 Dyslipidemia   . Hypertension   . Lumbosacral radiculopathy at L4 02/18/2017  .  Myocardial infarction ([place]) 2012  . Obesity   . Sleep apnea 08/17/2013  . Snoring 10/12/2013  . TIA (transient ischemic attack) 08/09/2014  . Tubular adenoma of colon 01/12/2014   transverse colon  . VF (ventricular fibrillation) ([place])    arrest  . Vitamin D deficiency 02/18/2017    Patient Active Problem List   Diagnosis Date Noted  . Unstable angina ([place]) 07/06/2020  . H/O insulin dependent diabetes mellitus 06/27/2020  . Loss of weight 06/27/2020  . Constipation 06/27/2020  . Vomiting 06/27/2020  . History of colonic polyps 06/27/2020  . Coronary artery disease involving coronary bypass graft of native heart with angina pectoris ([place]) 02/19/2018  . Angina, class III ([place]) 02/19/2018  . Abnormal nuclear stress test 02/19/2018  . Diabetic polyneuropathy associated with type 2 diabetes mellitus ([place]) 02/18/2017  . Lumbosacral radiculopathy at L4 02/18/2017  . Vitamin D deficiency 02/18/2017  . Gastroesophageal reflux disease without esophagitis 05/14/2016  . Stasis ulcer of right ankle ([place]) 10/25/2014  . TIA (transient ischemic attack) 08/09/2014  . Acute ischemic stroke ([place]) 06/02/2014  . Family history of colon cancer in mother - 10 02/03/2014  . Benign neoplasm of colon- adenoma 01/12/2014  . Pain in joint, ankle and foot 11/30/2013  . Atherosclerotic PVD with ulceration ([place]) 11/30/2013  . Snoring 10/12/2013  . Hypersomnia 10/12/2013  . Anxiety   . Sleep apnea  08/17/2013  . Type 2 diabetes mellitus with vascular disease ([place]) 07/20/2013  . Obesity 04/20/2013  . CHF (congestive heart failure) ([place]) 03/10/2013  . Ischemic heart disease due to coronary artery obstruction ([place]) 03/10/2013  . Cervical spondylosis 03/01/2013  . DDD (degenerative disc disease), cervical 03/01/2013  . Single implantable cardioverter-defibrillator (ICD) in situ 12/11/2010  . Hyperlipidemia 09/27/2010  . HYPERTENSION, BENIGN 09/27/2010  . CAD, NATIVE VESSEL 09/27/2010  . VENTRICULAR  FIBRILLATION 09/27/2010    Past Surgical History:  Procedure Laterality Date  . ABDOMINAL AORTAGRAM N/A 12/14/2013   Procedure: ABDOMINAL Maxcine Ham;  Surgeon: [name], MD;  Location: [facility] CATH LAB;  Service: Cardiovascular;  Laterality: N/A;  . CARDIAC CATHETERIZATION  09/16/10  . COLONOSCOPY  01/12/2014  . CORONARY ARTERY BYPASS GRAFT  2006  . CORONARY STENT INTERVENTION  07/06/2020  . CORONARY STENT INTERVENTION N/A 07/06/2020   Procedure: CORONARY STENT INTERVENTION;  Surgeon: Nelva Bush, MD;  Location: [facility] CV LAB;  Service: Cardiovascular;  Laterality: N/A;  . IMPLANTABLE CARDIOVERTER DEFIBRILLATOR IMPLANT  2012   STJ ICD- single chamber  . LEFT HEART CATH AND CORS/GRAFTS ANGIOGRAPHY N/A 02/19/2018   Procedure: LEFT HEART CATH AND CORS/GRAFTS ANGIOGRAPHY;  Surgeon: [name], MD;  Location: [facility] CV LAB;  Service: Cardiovascular;  Laterality: N/A;  . LEFT HEART CATH AND CORS/GRAFTS ANGIOGRAPHY N/A 07/06/2020   Procedure: LEFT HEART CATH AND CORS/GRAFTS ANGIOGRAPHY;  Surgeon: Nelva Bush, MD;  Location: [facility] CV LAB;  Service: Cardiovascular;  Laterality: N/A;  . LOOP RECORDER IMPLANT N/A 08/24/2014   Procedure: LOOP RECORDER IMPLANT;  Surgeon: [name], MD;  Location: [facility] CATH LAB;  Service: Cardiovascular;  Laterality: N/A;  . MASS EXCISION  2008   right shoulder  . STERNECTOMY         Family History  Problem Relation Age of Onset  . Coronary artery disease Father        early 31's  . Diabetes Father   . Hypertension Father   . Breast cancer Mother 58       mets to colon and other  . Glaucoma Mother   . Prostate cancer Brother   . Heart disease Brother   . Diabetes Sister     Social History   Tobacco Use  . Smoking status: Never Smoker  . Smokeless tobacco: Never Used  Vaping Use  . Vaping Use: Never used  Substance Use Topics  . Alcohol use: No  . Drug use: No    Home Medications Prior to Admission medications    Medication Sig Start Date End Date Taking? Authorizing Provider  aspirin 81 MG tablet Take 81 mg by mouth daily.     [provider]  clopidogrel (PLAVIX) 75 MG tablet Take 75 mg by mouth daily. 05/14/16   [provider]  dapagliflozin propanediol (FARXIGA) 10 MG TABS tablet Take 10 mg by mouth at bedtime.    [provider]  furosemide (LASIX) 40 MG tablet Take 1 tablet (40 mg total) by mouth daily. 08/07/20 11/05/20  Werner Lean, MD  insulin glargine (LANTUS) 100 UNIT/ML Solostar Pen Inject 70 Units into the skin daily before breakfast. 11/19/16   [provider]  metFORMIN (GLUCOPHAGE) 500 MG tablet Take 1 tablet (500 mg total) by mouth 2 (two) times daily with a meal. 07/09/20   End, Harrell Gave, MD  metoprolol succinate (TOPROL-XL) 50 MG 24 hr tablet TAKE ONE TABLET BY MOUTH ONE TIME DAILY. TAKE WITH OR IMMEDIATELY FOLLOWING A  MEAL 06/12/20   [name], MD  nitroGLYCERIN (NITROSTAT) 0.4 MG SL tablet Place 0.4 mg under the tongue every 5 (five) minutes as needed for chest pain.    [provider]  pantoprazole (PROTONIX) 40 MG tablet Take 40 mg by mouth daily before breakfast. 09/25/16   [provider]  ranolazine (RANEXA) 500 MG 12 hr tablet TAKE ONE TABLET BY MOUTH TWICE DAILY.PLEASE MAKE APPT WITH DR.HARDING FOR REFILLS.  04/15/19   [name], MD  rosuvastatin (CRESTOR) 40 MG tablet TAKE ONE TABLET BY MOUTH DAILY 08/30/20   [name], MD  Semaglutide, 1 MG/DOSE, (OZEMPIC, 1 MG/DOSE,) 4 MG/3ML SOPN Inject into the skin every Thursday.    [provider]    Allergies    Patient has no known allergies.  Review of Systems   Review of Systems  All other systems reviewed and are negative. Ten systems reviewed and are negative for acute change, except as noted in the HPI.   Physical Exam Updated Vital Signs BP 102/74   Pulse 88   Temp 98.1 F (36.7 C) (Oral)   Resp (!) 21   Ht 5\' 6"  (1.676 m)   Wt  73.9 kg   SpO2 96%   BMI 26.31 kg/m   Physical Exam Vitals and nursing note reviewed.  Constitutional:      General: He is not in acute distress.    Appearance: Normal appearance. He is obese. He is not ill-appearing, toxic-appearing or diaphoretic.  HENT:     Head: Normocephalic and atraumatic.     Right Ear: External ear normal.     Left Ear: External ear normal.     Nose: Nose normal.     Mouth/Throat:     Mouth: Mucous membranes are moist.     Pharynx: Oropharynx is clear. No oropharyngeal exudate or posterior oropharyngeal erythema.  Eyes:     General: No scleral icterus.       Right eye: No discharge.        Left eye: No discharge.     Extraocular Movements: Extraocular movements intact.     Conjunctiva/sclera: Conjunctivae normal.  Cardiovascular:     Rate and Rhythm: Normal rate and regular rhythm.     Pulses: Normal pulses.     Heart sounds: Normal heart sounds. No murmur heard. No friction rub. No gallop.      Comments: Regular rate and rhythm.  No murmurs, rubs, gallops.  Well-healed surgical scar noted to the central chest. Pulmonary:     Effort: Pulmonary effort is normal. No respiratory distress.     Breath sounds: Normal breath sounds. No stridor. No wheezing, rhonchi or rales.     Comments: Lungs are clear to auscultation bilaterally.  Speaking in clear, complete, and coherent sentences.  No respiratory distress. Abdominal:     General: Abdomen is flat.     Palpations: Abdomen is soft.     Tenderness: There is no abdominal tenderness.     Comments: Protuberant abdomen that is soft and nontender.  Musculoskeletal:        General: No tenderness. Normal range of motion.     Cervical back: Normal range of motion and neck supple. No tenderness.     Comments: No palpable pain in the lower extremities.  No midline spine pain.  No low back pain noted with palpation.  Skin:    General: Skin is warm and dry.  Neurological:     General: No focal deficit present.  Mental Status: He is alert and oriented to person, place, and time.  Psychiatric:        Mood and Affect: Mood normal.        Behavior: Behavior normal.    ED Results / Procedures / Treatments   Labs (all labs ordered are listed, but only abnormal results are displayed) Labs Reviewed  COMPREHENSIVE METABOLIC PANEL - Abnormal; Notable for the following components:      Result Value   CO2 19 (*)    Glucose, Bld 134 (*)    BUN 55 (*)    Creatinine, Ser 2.80 (*)    Albumin 3.1 (*)    AST 208 (*)    ALT 173 (*)    Total Bilirubin 1.6 (*)    GFR, Estimated 24 (*)    Anion gap 16 (*)    All other components within normal limits  CBC WITH DIFFERENTIAL/PLATELET - Abnormal; Notable for the following components:   WBC 13.2 (*)    Neutro Abs 9.4 (*)    Monocytes Absolute 1.2 (*)    All other components within normal limits  CK - Abnormal; Notable for the following components:   Total CK 8,423 (*)    All other components within normal limits  TROPONIN I (HIGH SENSITIVITY) - Abnormal; Notable for the following components:   Troponin I (High Sensitivity) 10,598 (*)    All other components within normal limits  TROPONIN I (HIGH SENSITIVITY) - Abnormal; Notable for the following components:   Troponin I (High Sensitivity) 13,971 (*)    All other components within normal limits  RESP PANEL BY RT-PCR (FLU A&B, COVID) ARPGX2  URINALYSIS, ROUTINE W REFLEX MICROSCOPIC  HEPARIN LEVEL (UNFRACTIONATED)  CBC   EKG EKG Interpretation  Date/Time:  Friday September 08 2020 18:10:08 EST Ventricular Rate:  86 PR Interval:    QRS Duration: 109 QT Interval:  415 QTC Calculation: 497 R Axis:   -141 Text Interpretation: Sinus rhythm Probable left atrial enlargement Right ventricular hypertrophy Repol abnrm suggests ischemia, diffuse leads Minimal ST elevation, inferior leads Since last tracing rate faster Confirmed by Dorie Rank [phone]) on 09/08/2020 6:25:10 PM   Radiology DG Chest Portable 1  View  Result Date: 09/08/2020 CLINICAL DATA:  Chest pain. EXAM: PORTABLE CHEST 1 VIEW COMPARISON:  September 12, 2010 FINDINGS: A multi lead AICD is noted. Multiple sternal wires are seen. The heart size and mediastinal contours are within normal limits. A radiopaque loop recorder device is seen. There is no evidence of acute infiltrate, pleural effusion or pneumothorax. Degenerative changes are seen throughout the thoracic spine. IMPRESSION: Stable exam without acute or active cardiopulmonary disease. Electronically Signed   By: Virgina Norfolk M.D.   On: 09/08/2020 18:50    Procedures Procedures   Medications Ordered in ED Medications  heparin ADULT infusion 100 units/mL (25000 units/257mL) (900 Units/hr Intravenous New Bag/Given 09/08/20 2112)  aspirin chewable tablet 324 mg (has no administration in time range)  heparin bolus via infusion 4,000 Units (4,000 Units Intravenous Bolus from Bag 09/08/20 2112)  clopidogrel (PLAVIX) tablet 300 mg (300 mg Oral Given 09/08/20 2112)   ED Course  I have reviewed the triage vital signs and the nursing notes.  Pertinent labs & imaging results that were available during my care of the patient were reviewed by me and considered in my medical decision making (see chart for details).  Clinical Course as of 09/08/20 2331  Fri Sep 08, 2020  1855 DG Chest Portable 1 View IMPRESSION: Stable  exam without acute or active cardiopulmonary disease. [LJ]  1855 Troponin I (High Sensitivity)(!!): 10,598 [LJ]  2027 Discussed with cardiology.  They are going to evaluate the patient at this time and provide recommendations.  Patient started on heparin.  Also given an additional dose of Plavix. [LJ]  2109 DG Chest Portable 1 View IMPRESSION: Stable exam without acute or active cardiopulmonary disease. [LJ]  2147 Patient discussed with cardiology who also evaluated patient at bedside.  They do not believe this is ACS.  Patient is currently not having any chest pain.   Patient endorses unintentional weight loss to cardiologist.  Notes that he is also scheduled for a colonoscopy as well as an upper endoscopy.   [LJ]  2149 Troponin I (High Sensitivity)(!!): 13,971 [LJ]  2236 CK Total(!): 8,423 [LJ]    Clinical Course User Index [LJ] Rayna Sexton, PA-C   MDM Rules/Calculators/A&P                          Pt is a 65 y.o. male who presents the emergency department with intermittent chest pain as well as diffuse leg pain and weakness.  Labs: CBC with a leukocytosis of 13.2 and a neutrophilia of 9.4. CMP with an elevated creatinine at 2.8 with a GFR of 24.  Mildly elevated compared to prior values.  Glucose of 134, CO2 of 19, BUN of 55, albumin of 3.1.  Elevated LFTs with an AST of 208 and an ALT of 173. CK elevated at 8423. Troponin of 10,598 with a delta of 13,971.  Imaging: Chest x-ray is negative.  I, Rayna Sexton, PA-C, personally reviewed and evaluated these images and lab results as part of my medical decision-making.  Patient discussed with and evaluated by cardiology who is unsure if this is ACS.  No active chest pain at this time.  Has not had chest pain since last night.  Patient given aspirin, Plavix, as well as started on heparin.  Recommended medicine admission.  We obtained a CK which was elevated significantly as well.  Patient having worsening weakness and pain in the lower extremities.  This seems to be his primary complaint.  Doppler study of lower extremities is pending as well.  Will admit to the medicine team for further work-up.  Cardiology will consult on the patient.  This was discussed with the patient and he is amenable.  Note: Portions of this report may have been transcribed using voice recognition software. Every effort was made to ensure accuracy; however, inadvertent computerized transcription errors may be present.   Final Clinical Impression(s) / ED Diagnoses Final diagnoses:  Weakness  Chest pain, unspecified type   Non-traumatic rhabdomyolysis   Rx / DC Orders ED Discharge Orders    None       Moody Bruins 09/08/20 2332    Dorie Rank, MD 09/08/20 2349    Rayna Sexton, PA-C 09/09/20 Jen Mow    Dorie Rank, MD 09/09/20 1505

## 2020-09-08 NOTE — ED Provider Notes (Incomplete)
[facility] EMERGENCY DEPARTMENT Provider Note   CSN: [id number] Arrival date & time: 09/08/20  1801     History Chief Complaint  Patient presents with  . Weakness    Eugene [name] is a 65 y.o. male.  HPI   Patient is a 65 year old male with a history of CHF, CAD, diabetes mellitus, obesity, V. fib arrest, status post ICD insertion.  Presents the emergency department due to chest pain, shortness of breath, lower extremity weakness.  Patient states that about 2 months ago he had to new cardiac stents placed.  He states that for about 1 month he was no longer experiencing any chest pain or shortness of breath.  About 2 weeks ago his symptoms began once again recur.  He states about 6 days ago he felt as if he was having daily, intermittent, central chest pain that was similar to prior episodes.  Has been intermittently taking nitroglycerin which provides relief.  Reports associated shortness of breath that worsens with exertion.  No leg swelling.  States he has been compliant with all of his medications.  Lastly, patient notes diffuse leg pain for the past few days.  States it worsens with any movement of the lower extremities.  Unsure of the source of his pain.  Denies any back pain.  No injury.  Also reports associated weakness in the lower legs.  States that he has having difficulty now standing and ambulating due to pain and weakness. Upon further discussion feels that he has been having unexplained weight loss over the past couple of months.      Past Medical History:  Diagnosis Date  . Abnormal nuclear stress test 02/19/2018  . Angina, class III ([place]) 02/19/2018  . Anxiety   . Asthma   . CHF (congestive heart failure) ([place])   . Coronary artery disease   . DDD (degenerative disc disease), cervical 03/01/2013  . Diabetes mellitus    type 2  . 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 Dyslipidemia   . Hypertension   . Lumbosacral radiculopathy at L4 02/18/2017  .  Myocardial infarction ([place]) 2012  . Obesity   . Sleep apnea 08/17/2013  . Snoring 10/12/2013  . TIA (transient ischemic attack) 08/09/2014  . Tubular adenoma of colon 01/12/2014   transverse colon  . VF (ventricular fibrillation) ([place])    arrest  . Vitamin D deficiency 02/18/2017    Patient Active Problem List   Diagnosis Date Noted  . Unstable angina ([place]) 07/06/2020  . H/O insulin dependent diabetes mellitus 06/27/2020  . Loss of weight 06/27/2020  . Constipation 06/27/2020  . Vomiting 06/27/2020  . History of colonic polyps 06/27/2020  . Coronary artery disease involving coronary bypass graft of native heart with angina pectoris ([place]) 02/19/2018  . Angina, class III ([place]) 02/19/2018  . Abnormal nuclear stress test 02/19/2018  . Diabetic polyneuropathy associated with type 2 diabetes mellitus ([place]) 02/18/2017  . Lumbosacral radiculopathy at L4 02/18/2017  . Vitamin D deficiency 02/18/2017  . Gastroesophageal reflux disease without esophagitis 05/14/2016  . Stasis ulcer of right ankle ([place]) 10/25/2014  . TIA (transient ischemic attack) 08/09/2014  . Acute ischemic stroke ([place]) 06/02/2014  . Family history of colon cancer in mother - 38 02/03/2014  . Benign neoplasm of colon- adenoma 01/12/2014  . Pain in joint, ankle and foot 11/30/2013  . 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 Snoring 10/12/2013  . Hypersomnia 10/12/2013  . Anxiety   . Sleep apnea 08/17/2013  .  Type 2 diabetes mellitus with vascular disease ([place]) 07/20/2013  . Obesity 04/20/2013  . CHF (congestive heart failure) ([place]) 03/10/2013  . Ischemic heart disease due to coronary artery obstruction ([place]) 03/10/2013  . Cervical spondylosis 03/01/2013  . DDD (degenerative disc disease), cervical 03/01/2013  . Single implantable cardioverter-defibrillator (ICD) in situ 12/11/2010  . Hyperlipidemia 09/27/2010  . HYPERTENSION, BENIGN 09/27/2010  . CAD, NATIVE VESSEL 09/27/2010  . VENTRICULAR  FIBRILLATION 09/27/2010    Past Surgical History:  Procedure Laterality Date  . ABDOMINAL AORTAGRAM N/A 12/14/2013   Procedure: ABDOMINAL Maxcine Ham;  Surgeon: [name], MD;  Location: [facility] CATH LAB;  Service: Cardiovascular;  Laterality: N/A;  . CARDIAC CATHETERIZATION  09/16/10  . COLONOSCOPY  01/12/2014  . CORONARY ARTERY BYPASS GRAFT  2006  . CORONARY STENT INTERVENTION  07/06/2020  . CORONARY STENT INTERVENTION N/A 07/06/2020   Procedure: CORONARY STENT INTERVENTION;  Surgeon: Nelva Bush, MD;  Location: [facility] CV LAB;  Service: Cardiovascular;  Laterality: N/A;  . IMPLANTABLE CARDIOVERTER DEFIBRILLATOR IMPLANT  2012   STJ ICD- single chamber  . LEFT HEART CATH AND CORS/GRAFTS ANGIOGRAPHY N/A 02/19/2018   Procedure: LEFT HEART CATH AND CORS/GRAFTS ANGIOGRAPHY;  Surgeon: [name], MD;  Location: [place] CV LAB;  Service: Cardiovascular;  Laterality: N/A;  . LEFT HEART CATH AND CORS/GRAFTS ANGIOGRAPHY N/A 07/06/2020   Procedure: LEFT HEART CATH AND CORS/GRAFTS ANGIOGRAPHY;  Surgeon: Nelva Bush, MD;  Location: [facility] CV LAB;  Service: Cardiovascular;  Laterality: N/A;  . LOOP RECORDER IMPLANT N/A 08/24/2014   Procedure: LOOP RECORDER IMPLANT;  Surgeon: [name], MD;  Location: [facility] CATH LAB;  Service: Cardiovascular;  Laterality: N/A;  . MASS EXCISION  2008   right shoulder  . STERNECTOMY         Family History  Problem Relation Age of Onset  . Coronary artery disease Father        early 24's  . Diabetes Father   . Hypertension Father   . Breast cancer Mother 66       mets to colon and other  . Glaucoma Mother   . Prostate cancer Brother   . Heart disease Brother   . Diabetes Sister     Social History   Tobacco Use  . Smoking status: Never Smoker  . Smokeless tobacco: Never Used  Vaping Use  . Vaping Use: Never used  Substance Use Topics  . Alcohol use: No  . Drug use: No    Home Medications Prior to Admission medications    Medication Sig Start Date End Date Taking? Authorizing Provider  aspirin 81 MG tablet Take 81 mg by mouth daily.     [provider]  clopidogrel (PLAVIX) 75 MG tablet Take 75 mg by mouth daily. 05/14/16   [provider]  dapagliflozin propanediol (FARXIGA) 10 MG TABS tablet Take 10 mg by mouth at bedtime.    [provider]  furosemide (LASIX) 40 MG tablet Take 1 tablet (40 mg total) by mouth daily. 08/07/20 11/05/20  Werner Lean, MD  insulin glargine (LANTUS) 100 UNIT/ML Solostar Pen Inject 70 Units into the skin daily before breakfast. 11/19/16   [provider]  metFORMIN (GLUCOPHAGE) 500 MG tablet Take 1 tablet (500 mg total) by mouth 2 (two) times daily with a meal. 07/09/20   End, Harrell Gave, MD  metoprolol succinate (TOPROL-XL) 50 MG 24 hr tablet TAKE ONE TABLET BY MOUTH ONE TIME DAILY. TAKE WITH OR IMMEDIATELY FOLLOWING A MEAL 06/12/20  [name], MD  nitroGLYCERIN (NITROSTAT) 0.4 MG SL tablet Place 0.4 mg under the tongue every 5 (five) minutes as needed for chest pain.    [provider]  pantoprazole (PROTONIX) 40 MG tablet Take 40 mg by mouth daily before breakfast. 09/25/16   [provider]  ranolazine (RANEXA) 500 MG 12 hr tablet TAKE ONE TABLET BY MOUTH TWICE DAILY.PLEASE MAKE APPT WITH DR.HARDING FOR REFILLS.  04/15/19   [name], MD  rosuvastatin (CRESTOR) 40 MG tablet TAKE ONE TABLET BY MOUTH DAILY 08/30/20   [name], MD  Semaglutide, 1 MG/DOSE, (OZEMPIC, 1 MG/DOSE,) 4 MG/3ML SOPN Inject into the skin every Thursday.    [provider]    Allergies    Patient has no known allergies.  Review of Systems   Review of Systems  All other systems reviewed and are negative. Ten systems reviewed and are negative for acute change, except as noted in the HPI.   Physical Exam Updated Vital Signs BP 102/74   Pulse 88   Temp 98.1 F (36.7 C) (Oral)   Resp (!) 21   Ht 5\' 6"  (1.676 m)   Wt  73.9 kg   SpO2 96%   BMI 26.31 kg/m   Physical Exam Vitals and nursing note reviewed.  Constitutional:      General: He is not in acute distress.    Appearance: Normal appearance. He is obese. He is not ill-appearing, toxic-appearing or diaphoretic.  HENT:     Head: Normocephalic and atraumatic.     Right Ear: External ear normal.     Left Ear: External ear normal.     Nose: Nose normal.     Mouth/Throat:     Mouth: Mucous membranes are moist.     Pharynx: Oropharynx is clear. No oropharyngeal exudate or posterior oropharyngeal erythema.  Eyes:     General: No scleral icterus.       Right eye: No discharge.        Left eye: No discharge.     Extraocular Movements: Extraocular movements intact.     Conjunctiva/sclera: Conjunctivae normal.  Cardiovascular:     Rate and Rhythm: Normal rate and regular rhythm.     Pulses: Normal pulses.     Heart sounds: Normal heart sounds. No murmur heard. No friction rub. No gallop.      Comments: Regular rate and rhythm.  No murmurs, rubs, gallops.  Well-healed surgical scar noted to the central chest. Pulmonary:     Effort: Pulmonary effort is normal. No respiratory distress.     Breath sounds: Normal breath sounds. No stridor. No wheezing, rhonchi or rales.     Comments: Lungs are clear to auscultation bilaterally.  Speaking in clear, complete, and coherent sentences.  No respiratory distress. Abdominal:     General: Abdomen is flat.     Palpations: Abdomen is soft.     Tenderness: There is no abdominal tenderness.     Comments: Protuberant abdomen that is soft and nontender.  Musculoskeletal:        General: No tenderness. Normal range of motion.     Cervical back: Normal range of motion and neck supple. No tenderness.     Comments: No palpable pain in the lower extremities.  No midline spine pain.  No low back pain noted with palpation.  Skin:    General: Skin is warm and dry.  Neurological:     General: No focal deficit present.  Mental Status: He is alert and oriented to person, place, and time.  Psychiatric:        Mood and Affect: Mood normal.        Behavior: Behavior normal.    ED Results / Procedures / Treatments   Labs (all labs ordered are listed, but only abnormal results are displayed) Labs Reviewed  COMPREHENSIVE METABOLIC PANEL - Abnormal; Notable for the following components:      Result Value   CO2 19 (*)    Glucose, Bld 134 (*)    BUN 55 (*)    Creatinine, Ser 2.80 (*)    Albumin 3.1 (*)    AST 208 (*)    ALT 173 (*)    Total Bilirubin 1.6 (*)    GFR, Estimated 24 (*)    Anion gap 16 (*)    All other components within normal limits  CBC WITH DIFFERENTIAL/PLATELET - Abnormal; Notable for the following components:   WBC 13.2 (*)    Neutro Abs 9.4 (*)    Monocytes Absolute 1.2 (*)    All other components within normal limits  CK - Abnormal; Notable for the following components:   Total CK 8,423 (*)    All other components within normal limits  TROPONIN I (HIGH SENSITIVITY) - Abnormal; Notable for the following components:   Troponin I (High Sensitivity) 10,598 (*)    All other components within normal limits  TROPONIN I (HIGH SENSITIVITY) - Abnormal; Notable for the following components:   Troponin I (High Sensitivity) 13,971 (*)    All other components within normal limits  RESP PANEL BY RT-PCR (FLU A&B, COVID) ARPGX2  URINALYSIS, ROUTINE W REFLEX MICROSCOPIC  HEPARIN LEVEL (UNFRACTIONATED)  CBC   EKG EKG Interpretation  Date/Time:  Friday September 08 2020 18:10:08 EST Ventricular Rate:  86 PR Interval:    QRS Duration: 109 QT Interval:  415 QTC Calculation: 497 R Axis:   -141 Text Interpretation: Sinus rhythm Probable left atrial enlargement Right ventricular hypertrophy Repol abnrm suggests ischemia, diffuse leads Minimal ST elevation, inferior leads Since last tracing rate faster Confirmed by Dorie Rank [phone]) on 09/08/2020 6:25:10 PM   Radiology DG Chest Portable 1  View  Result Date: 09/08/2020 CLINICAL DATA:  Chest pain. EXAM: PORTABLE CHEST 1 VIEW COMPARISON:  September 12, 2010 FINDINGS: A multi lead AICD is noted. Multiple sternal wires are seen. The heart size and mediastinal contours are within normal limits. A radiopaque loop recorder device is seen. There is no evidence of acute infiltrate, pleural effusion or pneumothorax. Degenerative changes are seen throughout the thoracic spine. IMPRESSION: Stable exam without acute or active cardiopulmonary disease. Electronically Signed   By: Virgina Norfolk M.D.   On: 09/08/2020 18:50    Procedures Procedures   Medications Ordered in ED Medications  heparin ADULT infusion 100 units/mL (25000 units/225mL) (900 Units/hr Intravenous New Bag/Given 09/08/20 2112)  aspirin chewable tablet 324 mg (has no administration in time range)  heparin bolus via infusion 4,000 Units (4,000 Units Intravenous Bolus from Bag 09/08/20 2112)  clopidogrel (PLAVIX) tablet 300 mg (300 mg Oral Given 09/08/20 2112)   ED Course  I have reviewed the triage vital signs and the nursing notes.  Pertinent labs & imaging results that were available during my care of the patient were reviewed by me and considered in my medical decision making (see chart for details).  Clinical Course as of 09/08/20 2331  Fri Sep 08, 2020  1855 DG Chest Portable 1 View IMPRESSION: Stable  exam without acute or active cardiopulmonary disease. [LJ]  1855 Troponin I (High Sensitivity)(!!): 10,598 [LJ]  2027 Discussed with cardiology.  They are going to evaluate the patient at this time and provide recommendations.  Patient started on heparin.  Also given an additional dose of Plavix. [LJ]  2109 DG Chest Portable 1 View IMPRESSION: Stable exam without acute or active cardiopulmonary disease. [LJ]  2147 Patient discussed with cardiology who also evaluated patient at bedside.  They do not believe this is ACS.  Patient is currently not having any chest pain.   Patient endorses unintentional weight loss to cardiologist.  Notes that he is also scheduled for a colonoscopy as well as an upper endoscopy.   [LJ]  2149 Troponin I (High Sensitivity)(!!): 13,971 [LJ]  2236 CK Total(!): 8,423 [LJ]    Clinical Course User Index [LJ] Rayna Sexton, PA-C   MDM Rules/Calculators/A&P                          Pt is a 65 y.o. male who presents the emergency department with intermittent chest pain as well as diffuse leg pain and weakness.  Labs: CBC with a leukocytosis of 13.2 and a neutrophilia of 9.4. CMP with an elevated creatinine at 2.8 with a GFR of 24.  Mildly elevated compared to prior values.  Glucose of 134, CO2 of 19, BUN of 55, albumin of 3.1.  Elevated LFTs with an AST of 208 and an ALT of 173. CK elevated at 8423. Troponin of 10,598 with a delta of 13,971.  Imaging: Chest x-ray is negative.  I, Rayna Sexton, PA-C, personally reviewed and evaluated these images and lab results as part of my medical decision-making.  Patient discussed with and evaluated by cardiology who does not believe this is ACS.  No active chest pain at this time.  Has not had chest pain since last night.  Patient given aspirin, Plavix, as well as started on heparin.  We obtained a CK which was elevated significantly as well.  Patient having worsening weakness and pain in the lower extremities.  This seems to be his primary complaint.  Doppler study of lower extremities is pending as well.  Will admit to the medicine team for further work-up.  Cardiology will consult on the patient.  This was discussed with the patient and he is amenable.  Note: Portions of this report may have been transcribed using voice recognition software. Every effort was made to ensure accuracy; however, inadvertent computerized transcription errors may be present.   Final Clinical Impression(s) / ED Diagnoses Final diagnoses:  Weakness  Chest pain, unspecified type  Non-traumatic rhabdomyolysis     Rx / DC Orders ED Discharge Orders    None       Rayna Sexton, PA-C 09/08/20 2332    Dorie Rank, MD 09/08/20 2349

## 2020-09-08 NOTE — ED Triage Notes (Signed)
Pt BIB [facility] from home, pt c/o increasing bilateral leg weakness, numbness and pain, difficulty standing today. Denies injury/fall. Also reports intermittent chest pain x 1 week, that is relieved with 2 NTG. Pt with cardiac hx and stents.

## 2020-09-08 NOTE — Consult Note (Addendum)
Cardiology Consultation:   Patient ID: Eugene [name] MRN: [id number]; DOB: 29-Sep-1955  Admit date: 09/08/2020 Date of Consult: 09/08/2020  Primary Care Provider: [name], MD Primary Cardiologist: Cristopher Peru, MD  Primary Electrophysiologist:  None    Patient Profile:   Eugene [name] is a 65 y.o. male with a hx of CAD with 4vCABG in 2006, T2DM who is being seen today for the evaluation of chest pain and nstemi at the request of emergency department.  History of Present Illness:   Eugene [name] 65 year old male with a history of coronary artery disease with a four-vessel CABG in 2006, type 2 diabetes, history of V. fib arrest status post ICD insertion today with 1 week of progressive lower extremity weakness intermittent substernal centralized chest pain.  Notes that he has had difficulty with leg weakness starting about a week ago this is progressed to the point where he is unable to stand.  Movement he has been eating some chest discomfort goes away with nitroglycerin.  However, his reason for coming to the emergency department was his inability to walk today.  He was not actively having chest pain when I saw him in the emergency department and notes that his last episode of chest pain was 24 hours prior to taking nitroglycerin.  The rest of his review of systems were only remarkable for a 60 pound unintentional weight loss and several months of difficulty with urination and dysuria.  He also tells me that last heart catheterization and stent, he has been referred to a gastroenterologist to get an upper and lower endoscopy to work-up his unintentional weight loss.  I saw him in the emergency department he was chest pain-free.  He did have EKG with some ST depressions only submillimeter ST elevation in V1 and aVR.  He was without chest pain talking and breathing comfortably.  Past Medical History:  Diagnosis Date  . Abnormal nuclear stress test 02/19/2018  . Angina, class III ([place])  02/19/2018  . Anxiety   . Asthma   . CHF (congestive heart failure) ([place])   . Coronary artery disease   . DDD (degenerative disc disease), cervical 03/01/2013  . Diabetes mellitus    type 2  . 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 Dyslipidemia   . Hypertension   . Lumbosacral radiculopathy at L4 02/18/2017  . Myocardial infarction ([place]) 2012  . Obesity   . Sleep apnea 08/17/2013  . Snoring 10/12/2013  . TIA (transient ischemic attack) 08/09/2014  . Tubular adenoma of colon 01/12/2014   transverse colon  . VF (ventricular fibrillation) ([place])    arrest  . Vitamin D deficiency 02/18/2017    Past Surgical History:  Procedure Laterality Date  . ABDOMINAL AORTAGRAM N/A 12/14/2013   Procedure: ABDOMINAL Maxcine Ham;  Surgeon: [name], MD;  Location: [facility] CATH LAB;  Service: Cardiovascular;  Laterality: N/A;  . CARDIAC CATHETERIZATION  09/16/10  . COLONOSCOPY  01/12/2014  . CORONARY ARTERY BYPASS GRAFT  2006  . CORONARY STENT INTERVENTION  07/06/2020  . CORONARY STENT INTERVENTION N/A 07/06/2020   Procedure: CORONARY STENT INTERVENTION;  Surgeon: Nelva Bush, MD;  Location: [facility] CV LAB;  Service: Cardiovascular;  Laterality: N/A;  . IMPLANTABLE CARDIOVERTER DEFIBRILLATOR IMPLANT  2012   STJ ICD- single chamber  . LEFT HEART CATH AND CORS/GRAFTS ANGIOGRAPHY N/A 02/19/2018   Procedure: LEFT HEART CATH AND CORS/GRAFTS ANGIOGRAPHY;  Surgeon: [name], MD;  Location: [facility] CV LAB;  Service: Cardiovascular;  Laterality: N/A;  .  LEFT HEART CATH AND CORS/GRAFTS ANGIOGRAPHY N/A 07/06/2020   Procedure: LEFT HEART CATH AND CORS/GRAFTS ANGIOGRAPHY;  Surgeon: Nelva Bush, MD;  Location: [facility] CV LAB;  Service: Cardiovascular;  Laterality: N/A;  . LOOP RECORDER IMPLANT N/A 08/24/2014   Procedure: LOOP RECORDER IMPLANT;  Surgeon: [name], MD;  Location: [facility] CATH LAB;  Service: Cardiovascular;  Laterality: N/A;  . MASS EXCISION  2008   right shoulder  .  STERNECTOMY       Home Medications:  Prior to Admission medications   Medication Sig Start Date End Date Taking? Authorizing Provider  aspirin 81 MG tablet Take 81 mg by mouth daily.     [provider]  clopidogrel (PLAVIX) 75 MG tablet Take 75 mg by mouth daily. 05/14/16   [provider]  dapagliflozin propanediol (FARXIGA) 10 MG TABS tablet Take 10 mg by mouth at bedtime.    [provider]  furosemide (LASIX) 40 MG tablet Take 1 tablet (40 mg total) by mouth daily. 08/07/20 11/05/20  Werner Lean, MD  insulin glargine (LANTUS) 100 UNIT/ML Solostar Pen Inject 70 Units into the skin daily before breakfast. 11/19/16   [provider]  metFORMIN (GLUCOPHAGE) 500 MG tablet Take 1 tablet (500 mg total) by mouth 2 (two) times daily with a meal. 07/09/20   End, Harrell Gave, MD  metoprolol succinate (TOPROL-XL) 50 MG 24 hr tablet TAKE ONE TABLET BY MOUTH ONE TIME DAILY. TAKE WITH OR IMMEDIATELY FOLLOWING A MEAL 06/12/20   [name], MD  nitroGLYCERIN (NITROSTAT) 0.4 MG SL tablet Place 0.4 mg under the tongue every 5 (five) minutes as needed for chest pain.    [provider]  pantoprazole (PROTONIX) 40 MG tablet Take 40 mg by mouth daily before breakfast. 09/25/16   [provider]  ranolazine (RANEXA) 500 MG 12 hr tablet TAKE ONE TABLET BY MOUTH TWICE DAILY.PLEASE MAKE APPT WITH DR.HARDING FOR REFILLS.  04/15/19   [name], MD  rosuvastatin (CRESTOR) 40 MG tablet TAKE ONE TABLET BY MOUTH DAILY 08/30/20   [name], MD  Semaglutide, 1 MG/DOSE, (OZEMPIC, 1 MG/DOSE,) 4 MG/3ML SOPN Inject into the skin every Thursday.    [provider]    Inpatient Medications: Scheduled Meds:  Continuous Infusions: . heparin 900 Units/hr (09/08/20 2112)   PRN Meds:   Allergies:   No Known Allergies  Social History:   Social History   Socioeconomic History  . Marital status: Single    Spouse name: Not on file  .  Number of children: 0  . Years of education: Not on file  . Highest education level: Not on file  Occupational History  . Occupation: STORE Programme researcher, broadcasting/film/video: [employer]    Comment: [employer]  Tobacco Use  . Smoking status: Never Smoker  . Smokeless tobacco: Never Used  Vaping Use  . Vaping Use: Never used  Substance and Sexual Activity  . Alcohol use: No  . Drug use: No  . Sexual activity: Not on file  Other Topics Concern  . 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 Coronary artery disease Father        early 87's  . Diabetes Father   . Hypertension Father   . Breast cancer Mother 60       mets to colon and other  . Glaucoma Mother   . Prostate cancer Brother   . Heart disease Brother   . Diabetes Sister      Review of Systems: [y] = yes, [ ]  = no     General: Weight gain [ ] ; Weight loss Blue.Reese ]; Anorexia [ ] ; Fatigue Blue.Reese ]; Fever [ ] ; Chills [ ] ; Weakness Blue.Reese ]   Cardiac: Chest pain/pressure [ y]; Resting SOB [ ] ; Exertional SOB [ ] ; Orthopnea [ ] ; Pedal Edema [ ] ; Palpitations [ ] ; Syncope [ ] ; Presyncope [ ] ; Paroxysmal nocturnal dyspnea[ ]    Pulmonary: Cough [ ] ; Wheezing[ ] ; Hemoptysis[ ] ; Sputum [ ] ; Snoring [ ]    GI: Vomiting[ ] ; Dysphagia[ ] ; Melena[ ] ; Hematochezia [ ] ; Heartburn[ ] ; Abdominal pain [ ] ; Constipation [ ] ; Diarrhea [ ] ; BRBPR [ ]    GU: Hematuria[ ] ; Dysuria [ y]; Nocturia[y ]   Vascular: Pain in legs with walking [ ] ; Pain in feet with lying flat [ ] ; Non-healing sores [ ] ; Stroke [ ] ; TIA [  ]; Slurred speech [ ] ;   Neuro: Headaches[ ] ; Vertigo[ ] ; Seizures[ ] ; Paresthesias[ ] ;Blurred vision [ ] ; Diplopia [ ] ; Vision changes [ ]    Ortho/Skin: Arthritis [ ] ; Joint pain [ ] ; Muscle pain [ ] ; Joint swelling [ ] ; Back Pain [ ] ; Rash [ ]    Psych: Depression[ ] ; Anxiety[ ]    Heme: Bleeding problems [ ] ; Clotting disorders [ ] ; Anemia [ ]    Endocrine: Diabetes [ ] ; Thyroid dysfunction[ ]   Physical Exam/Data:   Vitals:   09/08/20 2100 09/08/20 2115 09/08/20 2130 09/08/20 2200  BP: 110/73 113/80 121/86 102/74  Pulse: 84 84 85 88  Resp: (!) 22 (!) 24 (!) 22 (!) 21  Temp:      TempSrc:      SpO2: 98% 99% 97% 96%  Weight:      Height:       No intake or output data in the 24 hours ending 09/08/20 2326 Filed Weights   09/08/20 1808  Weight: 73.9 kg   Body mass index is 26.31 kg/m.  General:  Lying comfortably in bed, NAD HEENT: normal Lymph: no adenopathy Neck: no JVD Endocrine:  No thryomegaly Vascular: No carotid bruits; FA pulses 2+ bilaterally without bruits  Cardiac:  normal S1, S2; RRR; no murmur  Lungs:  clear to auscultation bilaterally, no wheezing, rhonchi or rales  Abd: soft, nontender, no hepatomegaly  Ext: no edema Skin: warm and dry  Neuro:  CNs 2-12 intact, sensory intact. Upper extremity strength intact and 5/5 bilaterally. Lower extremity proximal muscle 2/5. Normal reflexes  Psych:  Normal affect   EKG:  The EKG was personally reviewed and demonstrates:  St depression globally  Telemetry:  Telemetry was personally reviewed and demonstrates:  Normal sinus with some PVC   Relevant CV Studies: N/a   Laboratory Data:  Chemistry Recent Labs  Lab 09/06/20 0720 09/08/20 1823  NA 135 137  K 4.2 3.5  CL 95* 102  CO2 18* 19*  GLUCOSE 274* 134*  BUN 41* 55*  CREATININE 2.74* 2.80*  CALCIUM 9.4  9.2  GFRNONAA 23* 24*  GFRAA 27*  --   ANIONGAP  --  16*    Recent Labs  Lab 09/08/20 1823  PROT 7.0  ALBUMIN 3.1*  AST 208*  ALT 173*   ALKPHOS 58  BILITOT 1.6*   Hematology Recent Labs  Lab 09/08/20 1823  WBC 13.2*  RBC 4.59  HGB 13.0  HCT 39.9  MCV 86.9  MCH 28.3  MCHC 32.6  RDW 14.3  PLT 177   Cardiac EnzymesNo results for input(s): TROPONINI in the last 168 hours. No results for input(s): TROPIPOC in the last 168 hours.  BNPNo results for input(s): BNP, PROBNP in the last 168 hours.  DDimer No results for input(s): DDIMER in the last 168 hours.  Radiology/Studies:  DG Chest Portable 1 View  Result Date: 09/08/2020 CLINICAL DATA:  Chest pain. EXAM: PORTABLE CHEST 1 VIEW COMPARISON:  September 12, 2010 FINDINGS: A multi lead AICD is noted. Multiple sternal wires are seen. The heart size and mediastinal contours are within normal limits. A radiopaque loop recorder device is seen. There is no evidence of acute infiltrate, pleural effusion or pneumothorax. Degenerative changes are seen throughout the thoracic spine. IMPRESSION: Stable exam without acute or active cardiopulmonary disease. Electronically Signed   By: Virgina Norfolk M.D.   On: 09/08/2020 18:50    Assessment and Plan:   1. NSTEMI.  His incredibly elevated troponin is both bothersome and difficult to explain.  Clinically he is not having any signs of acute coronary syndrome with no chest pain actively at the moment.  He had some ongoing chest pain episodes over the last week both with exertion and at rest.  But has been without pain for 24 hours after nitroglycerin last week.  Given that he is currently chest pain-free and also has CKD with a creatinine of 2.8, would medically manage for now.   1. Reasonable to keep heparin on for 24 to 48 hours to ensure stability given he does not have an apparent bleeding risk.   2. Would also get echo tomorrow to evaluate for structural or myocardial injury.    3. Continue aspirin and Plavix.   4. Given unclear reason why he has a high CK as well and a liver injury, would hold rosuvastatin. 5. If he were to become  hemodynamically unstable please notify cardiology as her recommendations may change.  However, catheterization would need to be nuanced discussion with him given his chronic kidney disease. 6. OK to continue ranolazine 7. Would hold Lasix at this time as patient does not appear to be volume overloaded, if anything he is volume down with elevated CK may consider volume resusciatation. 2. Lower extremity weakness and unintentional weight loss.  Given this combination of symptoms the  most worrisome trigger is an undiagnosed underlying malignancy with a paraneoplastic syndrome (I.e.dermatomyositis/polymyositis).  Would also be worried about potential for prostate cancer with metastasis given his symptoms (family hx of prostate cancer in brother).  Regardless, would recommend CT chest abdomen pelvis and consider spinal imaging as well.  Though from physical exam it seems like this is an myopathy.  Other things to consider would be drug-induced (statin myopathy) given liver injury pattern and proximal muscle weakness.  Would hold rosuvastatin at this time.  Consider neurological consultation. Defer to medicine for further thorough evaluation. Nonsmoker/drinker.       For questions or updates, please contact [facility] Please consult [email or web address] for contact info under     Signed, Doyne Keel,  MD  09/08/2020 11:26 PM

## 2020-09-09 ENCOUNTER — Inpatient Hospital Stay (HOSPITAL_COMMUNITY): Payer: Medicare HMO

## 2020-09-09 ENCOUNTER — Encounter (HOSPITAL_COMMUNITY): Payer: Self-pay | Admitting: Family Medicine

## 2020-09-09 ENCOUNTER — Other Ambulatory Visit (HOSPITAL_COMMUNITY): Payer: Medicare HMO

## 2020-09-09 DIAGNOSIS — I25709 Atherosclerosis of coronary artery bypass graft(s), unspecified, with unspecified angina pectoris: Secondary | ICD-10-CM

## 2020-09-09 DIAGNOSIS — R7989 Other specified abnormal findings of blood chemistry: Secondary | ICD-10-CM

## 2020-09-09 DIAGNOSIS — E876 Hypokalemia: Secondary | ICD-10-CM | POA: Diagnosis present

## 2020-09-09 DIAGNOSIS — Z95810 Presence of automatic (implantable) cardiac defibrillator: Secondary | ICD-10-CM | POA: Diagnosis not present

## 2020-09-09 DIAGNOSIS — I25719 Atherosclerosis of autologous vein coronary artery bypass graft(s) with unspecified angina pectoris: Secondary | ICD-10-CM | POA: Diagnosis not present

## 2020-09-09 DIAGNOSIS — R3912 Poor urinary stream: Secondary | ICD-10-CM

## 2020-09-09 DIAGNOSIS — N179 Acute kidney failure, unspecified: Secondary | ICD-10-CM | POA: Diagnosis not present

## 2020-09-09 DIAGNOSIS — J849 Interstitial pulmonary disease, unspecified: Secondary | ICD-10-CM

## 2020-09-09 DIAGNOSIS — I130 Hypertensive heart and chronic kidney disease with heart failure and stage 1 through stage 4 chronic kidney disease, or unspecified chronic kidney disease: Secondary | ICD-10-CM | POA: Diagnosis not present

## 2020-09-09 DIAGNOSIS — M6282 Rhabdomyolysis: Secondary | ICD-10-CM | POA: Diagnosis not present

## 2020-09-09 DIAGNOSIS — Y712 Prosthetic and other implants, materials and accessory cardiovascular devices associated with adverse incidents: Secondary | ICD-10-CM | POA: Diagnosis present

## 2020-09-09 DIAGNOSIS — I672 Cerebral atherosclerosis: Secondary | ICD-10-CM | POA: Diagnosis not present

## 2020-09-09 DIAGNOSIS — R9431 Abnormal electrocardiogram [ECG] [EKG]: Secondary | ICD-10-CM

## 2020-09-09 DIAGNOSIS — R531 Weakness: Secondary | ICD-10-CM

## 2020-09-09 DIAGNOSIS — I4891 Unspecified atrial fibrillation: Secondary | ICD-10-CM | POA: Diagnosis not present

## 2020-09-09 DIAGNOSIS — I82461 Acute embolism and thrombosis of right calf muscular vein: Secondary | ICD-10-CM | POA: Diagnosis present

## 2020-09-09 DIAGNOSIS — R29818 Other symptoms and signs involving the nervous system: Secondary | ICD-10-CM | POA: Diagnosis not present

## 2020-09-09 DIAGNOSIS — E785 Hyperlipidemia, unspecified: Secondary | ICD-10-CM | POA: Diagnosis present

## 2020-09-09 DIAGNOSIS — I5032 Chronic diastolic (congestive) heart failure: Secondary | ICD-10-CM | POA: Diagnosis present

## 2020-09-09 DIAGNOSIS — G729 Myopathy, unspecified: Secondary | ICD-10-CM | POA: Diagnosis not present

## 2020-09-09 DIAGNOSIS — N1832 Chronic kidney disease, stage 3b: Secondary | ICD-10-CM

## 2020-09-09 DIAGNOSIS — I480 Paroxysmal atrial fibrillation: Secondary | ICD-10-CM | POA: Diagnosis not present

## 2020-09-09 DIAGNOSIS — I214 Non-ST elevation (NSTEMI) myocardial infarction: Secondary | ICD-10-CM

## 2020-09-09 DIAGNOSIS — I959 Hypotension, unspecified: Secondary | ICD-10-CM | POA: Diagnosis not present

## 2020-09-09 DIAGNOSIS — E1122 Type 2 diabetes mellitus with diabetic chronic kidney disease: Secondary | ICD-10-CM | POA: Diagnosis present

## 2020-09-09 DIAGNOSIS — I4901 Ventricular fibrillation: Secondary | ICD-10-CM | POA: Diagnosis not present

## 2020-09-09 DIAGNOSIS — J479 Bronchiectasis, uncomplicated: Secondary | ICD-10-CM | POA: Diagnosis not present

## 2020-09-09 DIAGNOSIS — U071 COVID-19: Secondary | ICD-10-CM | POA: Diagnosis not present

## 2020-09-09 DIAGNOSIS — R0789 Other chest pain: Secondary | ICD-10-CM | POA: Diagnosis not present

## 2020-09-09 DIAGNOSIS — I2510 Atherosclerotic heart disease of native coronary artery without angina pectoris: Secondary | ICD-10-CM | POA: Diagnosis not present

## 2020-09-09 DIAGNOSIS — T82855A Stenosis of coronary artery stent, initial encounter: Secondary | ICD-10-CM | POA: Diagnosis present

## 2020-09-09 DIAGNOSIS — N390 Urinary tract infection, site not specified: Secondary | ICD-10-CM | POA: Diagnosis not present

## 2020-09-09 DIAGNOSIS — N184 Chronic kidney disease, stage 4 (severe): Secondary | ICD-10-CM

## 2020-09-09 DIAGNOSIS — J8410 Pulmonary fibrosis, unspecified: Secondary | ICD-10-CM | POA: Diagnosis present

## 2020-09-09 DIAGNOSIS — I82451 Acute embolism and thrombosis of right peroneal vein: Secondary | ICD-10-CM | POA: Diagnosis not present

## 2020-09-09 DIAGNOSIS — E119 Type 2 diabetes mellitus without complications: Secondary | ICD-10-CM | POA: Diagnosis not present

## 2020-09-09 DIAGNOSIS — Z8674 Personal history of sudden cardiac arrest: Secondary | ICD-10-CM | POA: Diagnosis not present

## 2020-09-09 DIAGNOSIS — R29898 Other symptoms and signs involving the musculoskeletal system: Secondary | ICD-10-CM

## 2020-09-09 DIAGNOSIS — J432 Centrilobular emphysema: Secondary | ICD-10-CM | POA: Diagnosis not present

## 2020-09-09 DIAGNOSIS — R634 Abnormal weight loss: Secondary | ICD-10-CM | POA: Diagnosis present

## 2020-09-09 DIAGNOSIS — I82431 Acute embolism and thrombosis of right popliteal vein: Secondary | ICD-10-CM | POA: Diagnosis not present

## 2020-09-09 DIAGNOSIS — M6281 Muscle weakness (generalized): Secondary | ICD-10-CM | POA: Diagnosis not present

## 2020-09-09 DIAGNOSIS — I25810 Atherosclerosis of coronary artery bypass graft(s) without angina pectoris: Secondary | ICD-10-CM | POA: Diagnosis not present

## 2020-09-09 LAB — CBG MONITORING, ED
Glucose-Capillary: 76 mg/dL (ref 70–99)
Glucose-Capillary: 91 mg/dL (ref 70–99)

## 2020-09-09 LAB — URINALYSIS, ROUTINE W REFLEX MICROSCOPIC
Bilirubin Urine: NEGATIVE
Glucose, UA: 150 mg/dL — AB
Ketones, ur: NEGATIVE mg/dL
Nitrite: NEGATIVE
Protein, ur: 100 mg/dL — AB
RBC / HPF: >50 RBC/hpf — ABNORMAL HIGH (ref 0–5)
Specific Gravity, Urine: 1.013 (ref 1.005–1.030)
WBC, UA: >50 WBC/hpf — ABNORMAL HIGH (ref 0–5)
pH: 6 (ref 5.0–8.0)

## 2020-09-09 LAB — COMPREHENSIVE METABOLIC PANEL
ALT: 154 U/L — ABNORMAL HIGH (ref 0–44)
AST: 182 U/L — ABNORMAL HIGH (ref 15–41)
Albumin: 2.8 g/dL — ABNORMAL LOW (ref 3.5–5.0)
Alkaline Phosphatase: 46 U/L (ref 38–126)
Anion gap: 16 — ABNORMAL HIGH (ref 5–15)
BUN: 51 mg/dL — ABNORMAL HIGH (ref 8–23)
CO2: 17 mmol/L — ABNORMAL LOW (ref 22–32)
Calcium: 9 mg/dL (ref 8.9–10.3)
Chloride: 103 mmol/L (ref 98–111)
Creatinine, Ser: 2.56 mg/dL — ABNORMAL HIGH (ref 0.61–1.24)
GFR, Estimated: 27 mL/min — ABNORMAL LOW (ref >60)
Glucose, Bld: 89 mg/dL (ref 70–99)
Potassium: 2.8 mmol/L — ABNORMAL LOW (ref 3.5–5.1)
Sodium: 136 mmol/L (ref 135–145)
Total Bilirubin: 1.3 mg/dL — ABNORMAL HIGH (ref 0.3–1.2)
Total Protein: 6.1 g/dL — ABNORMAL LOW (ref 6.5–8.1)

## 2020-09-09 LAB — CBC
HCT: 37.8 % — ABNORMAL LOW (ref 39.0–52.0)
Hemoglobin: 12.3 g/dL — ABNORMAL LOW (ref 13.0–17.0)
MCH: 28.5 pg (ref 26.0–34.0)
MCHC: 32.5 g/dL (ref 30.0–36.0)
MCV: 87.5 fL (ref 80.0–100.0)
Platelets: 168 10*3/uL (ref 150–400)
RBC: 4.32 MIL/uL (ref 4.22–5.81)
RDW: 14.4 % (ref 11.5–15.5)
WBC: 12.2 10*3/uL — ABNORMAL HIGH (ref 4.0–10.5)
nRBC: 0 % (ref 0.0–0.2)

## 2020-09-09 LAB — CK: Total CK: 6921 U/L — ABNORMAL HIGH (ref 49–397)

## 2020-09-09 LAB — LIPID PANEL
Cholesterol: 117 mg/dL (ref 0–200)
HDL: 29 mg/dL — ABNORMAL LOW (ref >40)
LDL Cholesterol: 46 mg/dL (ref 0–99)
Total CHOL/HDL Ratio: 4 RATIO
Triglycerides: 211 mg/dL — ABNORMAL HIGH (ref <150)
VLDL: 42 mg/dL — ABNORMAL HIGH (ref 0–40)

## 2020-09-09 LAB — SEDIMENTATION RATE
Sed Rate: 83 mm/hr — ABNORMAL HIGH (ref 0–16)
Sed Rate: 104 mm/hr — ABNORMAL HIGH (ref 0–16)

## 2020-09-09 LAB — PROCALCITONIN: Procalcitonin: 0.1 ng/mL

## 2020-09-09 LAB — MAGNESIUM: Magnesium: 1.8 mg/dL (ref 1.7–2.4)

## 2020-09-09 LAB — RESP PANEL BY RT-PCR (FLU A&B, COVID) ARPGX2
Influenza A by PCR: NEGATIVE
Influenza B by PCR: NEGATIVE
SARS Coronavirus 2 by RT PCR: POSITIVE — AB

## 2020-09-09 LAB — PSA: Prostatic Specific Antigen: 0.97 ng/mL (ref 0.00–4.00)

## 2020-09-09 LAB — TROPONIN I (HIGH SENSITIVITY): Troponin I (High Sensitivity): 6373 ng/L (ref <18)

## 2020-09-09 LAB — CK TOTAL AND CKMB (NOT AT ARMC)
CK, MB: 28.2 ng/mL — ABNORMAL HIGH (ref 0.5–5.0)
Relative Index: 0.8 (ref 0.0–2.5)
Total CK: 3499 U/L — ABNORMAL HIGH (ref 49–397)

## 2020-09-09 LAB — GLUCOSE, CAPILLARY
Glucose-Capillary: 139 mg/dL — ABNORMAL HIGH (ref 70–99)
Glucose-Capillary: 171 mg/dL — ABNORMAL HIGH (ref 70–99)

## 2020-09-09 LAB — HEPARIN LEVEL (UNFRACTIONATED)
Heparin Unfractionated: 0.15 IU/mL — ABNORMAL LOW (ref 0.30–0.70)
Heparin Unfractionated: 0.31 IU/mL (ref 0.30–0.70)
Heparin Unfractionated: 0.34 IU/mL (ref 0.30–0.70)

## 2020-09-09 LAB — TSH: TSH: 2.186 u[IU]/mL (ref 0.350–4.500)

## 2020-09-09 LAB — VITAMIN B12: Vitamin B-12: 91 pg/mL — ABNORMAL LOW (ref 180–914)

## 2020-09-09 LAB — HIV ANTIBODY (ROUTINE TESTING W REFLEX): HIV Screen 4th Generation wRfx: NONREACTIVE

## 2020-09-09 MED ORDER — ENSURE ENLIVE PO LIQD: 237.0000 mL | Freq: Two times a day (BID) | ORAL | Status: DC

## 2020-09-09 MED ORDER — CLOPIDOGREL BISULFATE 75 MG PO TABS
75.0000 mg | ORAL_TABLET | Freq: Every day | ORAL | Status: DC
Start: 2020-09-09 — End: 2020-09-12
  Administered 2020-09-09 – 2020-09-12 (×4): 75 mg via ORAL
  Filled 2020-09-09 (×4): qty 1

## 2020-09-09 MED ORDER — INSULIN GLARGINE 100 UNIT/ML ~~LOC~~ SOLN
70.0000 [IU] | Freq: Every day | SUBCUTANEOUS | Status: DC
  Administered 2020-09-09: 70 [IU] via SUBCUTANEOUS
  Filled 2020-09-09 (×2): qty 0.7

## 2020-09-09 MED ORDER — RANOLAZINE ER 500 MG PO TB12
500.0000 mg | ORAL_TABLET | Freq: Two times a day (BID) | ORAL | Status: DC
Start: 2020-09-09 — End: 2020-09-09
  Administered 2020-09-09: 500 mg via ORAL
  Filled 2020-09-09 (×2): qty 1

## 2020-09-09 MED ORDER — PANTOPRAZOLE SODIUM 40 MG PO TBEC
40.0000 mg | DELAYED_RELEASE_TABLET | Freq: Every day | ORAL | Status: DC
  Administered 2020-09-09 – 2020-09-15 (×7): 40 mg via ORAL
  Filled 2020-09-09 (×7): qty 1

## 2020-09-09 MED ORDER — INSULIN ASPART 100 UNIT/ML ~~LOC~~ SOLN
0.0000 [IU] | Freq: Every day | SUBCUTANEOUS | Status: DC
  Administered 2020-09-10: 3 [IU] via SUBCUTANEOUS

## 2020-09-09 MED ORDER — CYANOCOBALAMIN 1000 MCG/ML IJ SOLN
1000.0000 ug | Freq: Every day | INTRAMUSCULAR | Status: AC
  Administered 2020-09-09: 1000 ug via SUBCUTANEOUS
  Filled 2020-09-09: qty 1

## 2020-09-09 MED ORDER — IOHEXOL 9 MG/ML PO SOLN
ORAL | Status: AC
  Filled 2020-09-09: qty 500

## 2020-09-09 MED ORDER — LACTATED RINGERS IV SOLN: INTRAVENOUS | Status: AC

## 2020-09-09 MED ORDER — INSULIN ASPART 100 UNIT/ML ~~LOC~~ SOLN
0.0000 [IU] | Freq: Three times a day (TID) | SUBCUTANEOUS | Status: DC
  Administered 2020-09-09 – 2020-09-10 (×2): 2 [IU] via SUBCUTANEOUS
  Administered 2020-09-11 (×2): 1 [IU] via SUBCUTANEOUS
  Administered 2020-09-11: 2 [IU] via SUBCUTANEOUS

## 2020-09-09 MED ORDER — ACETAMINOPHEN 325 MG PO TABS: 650.0000 mg | ORAL_TABLET | ORAL | Status: DC | PRN

## 2020-09-09 MED ORDER — SODIUM CHLORIDE 0.9 % IV SOLN
2.0000 g | INTRAVENOUS | Status: AC
  Administered 2020-09-09 – 2020-09-13 (×5): 2 g via INTRAVENOUS
  Filled 2020-09-09 (×5): qty 20

## 2020-09-09 MED ORDER — ASPIRIN EC 81 MG PO TBEC
81.0000 mg | DELAYED_RELEASE_TABLET | Freq: Every day | ORAL | Status: DC
  Administered 2020-09-09 – 2020-09-11 (×3): 81 mg via ORAL
  Filled 2020-09-09 (×3): qty 1

## 2020-09-09 MED ORDER — IOHEXOL 9 MG/ML PO SOLN: 500.0000 mL | ORAL | Status: AC

## 2020-09-09 MED ORDER — DAPAGLIFLOZIN PROPANEDIOL 10 MG PO TABS
10.0000 mg | ORAL_TABLET | Freq: Every day | ORAL | Status: DC
  Filled 2020-09-09: qty 1

## 2020-09-09 MED ORDER — HEPARIN BOLUS VIA INFUSION
2000.0000 [IU] | Freq: Once | INTRAVENOUS | Status: AC
  Administered 2020-09-09: 2000 [IU] via INTRAVENOUS
  Filled 2020-09-09: qty 2000

## 2020-09-09 MED ORDER — METOPROLOL SUCCINATE ER 50 MG PO TB24
50.0000 mg | ORAL_TABLET | Freq: Every day | ORAL | Status: DC
  Administered 2020-09-09 – 2020-09-10 (×2): 50 mg via ORAL
  Filled 2020-09-09 (×2): qty 1
  Filled 2020-09-09: qty 2

## 2020-09-09 MED ORDER — NITROGLYCERIN 0.4 MG SL SUBL
0.4000 mg | SUBLINGUAL_TABLET | SUBLINGUAL | Status: DC | PRN
  Administered 2020-09-10: 0.4 mg via SUBLINGUAL
  Filled 2020-09-09: qty 1

## 2020-09-09 MED ORDER — POTASSIUM CHLORIDE CRYS ER 20 MEQ PO TBCR
40.0000 meq | EXTENDED_RELEASE_TABLET | Freq: Four times a day (QID) | ORAL | Status: AC
  Administered 2020-09-09 (×2): 40 meq via ORAL
  Filled 2020-09-09 (×2): qty 2

## 2020-09-09 NOTE — H&P (Addendum)
History and Physical    Eugene [name] LSL:[id number] DOB: [birth year] DOA: 09/08/2020  PCP: [name], MD   Patient coming from:  Home  Chief Complaint:  Weakness of legs and unable to ambulate. Chest pain  HPI: Eugene [name] is a 65 y.o. 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  Reports that a week ago he had some numbness in his right leg that progressed to weakness.  For the last few days he has been able to walk around with the help of a cart that he wheels around at work.  Today the weakness became more profound and he was unable to even stand up.   He has also had intermittent chest pain and pressure over the last week.  He used nitroglycerin when he had chest pain and chest pain would resolve.  States he last had chest pain approximately 24 hours ago.   He reports he is also had unintended weight loss over the last few months of approximately 60 pounds.  He also reports that he has been having difficulty with urination with a weak stream and occasional intermittent dysuria.  He has been referred to gastroenterology for further work-up of his unintended weight loss.  Dates he has not had any fever or chills.  He denies any cough.  He has had no known exposure to Covid. Lives with his fiance.  He denies tobacco, alcohol use.  ED Course: The emergency room he is found to have a significantly elevated troponin level of over 10,000.  Repeat troponin was greater than 13,000.  He has not had any chest pain while in the emergency room.  He also had an elevated CPK level greater than 8000.  EKG showed mild ST depression submillimeter in inferior leads. He was started on heparin infusion.  Cardiology was consulted and is seen patient in the emergency  room.  Review of Systems:  General: Reports unintentional weight loss. Denies fever, chills, night sweats.  Denies dizziness.  Denies change in appetite HENT: Denies head trauma, headache, denies change in hearing, tinnitus. Denies nasal congestion or bleeding.  Denies sore throat, sores in mouth.  Denies difficulty swallowing Eyes: Denies blurry vision, pain in eye, drainage.  Denies discoloration of eyes. Neck: Denies pain.  Denies swelling.  Denies pain with movement. Cardiovascular: Reports chest pain. Denies palpitations.  Denies edema.  Denies orthopnea Respiratory: Denies shortness of breath, cough.  Denies wheezing.  Denies sputum production Gastrointestinal: Denies abdominal pain, swelling.  Denies nausea, vomiting, diarrhea.  Denies melena.  Denies hematemesis. Musculoskeletal: Reports weakness of legs and difficulty ambulating.  Denies deformity or swelling.  Denies pain.  Denies arthralgias or myalgias. Genitourinary: Denies pelvic pain.  Denies urinary frequency or hesitancy.  Denies dysuria.  Skin: Denies rash.  Denies petechiae, purpura, ecchymosis. Neurological: Reports numbness in right leg a week ago. Denies headache. Denies syncope. Denies seizure activity. Denies slurred speech, drooping face.  Denies visual change. Psychiatric: Denies depression, anxiety.  Denies hallucinations.  Past Medical History:  Diagnosis Date  . Abnormal nuclear stress test 02/19/2018  . Angina, class III ([place]) 02/19/2018  . Anxiety   . Asthma   . CHF (congestive heart failure) ([place])   . Coronary artery disease   . DDD (degenerative disc disease), cervical 03/01/2013  .  Diabetes mellitus    type 2  . 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 Dyslipidemia   . Hypertension   . Lumbosacral radiculopathy at L4 02/18/2017  . Myocardial infarction ([place]) 2012  . Obesity   . Sleep apnea 08/17/2013  . Snoring 10/12/2013  . TIA (transient ischemic attack) 08/09/2014  . Tubular adenoma of colon 01/12/2014    transverse colon  . VF (ventricular fibrillation) ([place])    arrest  . Vitamin D deficiency 02/18/2017    Past Surgical History:  Procedure Laterality Date  . ABDOMINAL AORTAGRAM N/A 12/14/2013   Procedure: ABDOMINAL Maxcine Ham;  Surgeon: [name], MD;  Location: [facility] CATH LAB;  Service: Cardiovascular;  Laterality: N/A;  . CARDIAC CATHETERIZATION  09/16/10  . COLONOSCOPY  01/12/2014  . CORONARY ARTERY BYPASS GRAFT  2006  . CORONARY STENT INTERVENTION  07/06/2020  . CORONARY STENT INTERVENTION N/A 07/06/2020   Procedure: CORONARY STENT INTERVENTION;  Surgeon: Nelva Bush, MD;  Location: [place] CV LAB;  Service: Cardiovascular;  Laterality: N/A;  . IMPLANTABLE CARDIOVERTER DEFIBRILLATOR IMPLANT  2012   STJ ICD- single chamber  . LEFT HEART CATH AND CORS/GRAFTS ANGIOGRAPHY N/A 02/19/2018   Procedure: LEFT HEART CATH AND CORS/GRAFTS ANGIOGRAPHY;  Surgeon: [name], MD;  Location: [place] CV LAB;  Service: Cardiovascular;  Laterality: N/A;  . LEFT HEART CATH AND CORS/GRAFTS ANGIOGRAPHY N/A 07/06/2020   Procedure: LEFT HEART CATH AND CORS/GRAFTS ANGIOGRAPHY;  Surgeon: Nelva Bush, MD;  Location: [place] CV LAB;  Service: Cardiovascular;  Laterality: N/A;  . LOOP RECORDER IMPLANT N/A 08/24/2014   Procedure: LOOP RECORDER IMPLANT;  Surgeon: [name], MD;  Location: [facility] CATH LAB;  Service: Cardiovascular;  Laterality: N/A;  . MASS EXCISION  2008   right shoulder  . STERNECTOMY      Social History  reports that he has never smoked. He has never used smokeless tobacco. He reports that he does not drink alcohol and does not use drugs.  No Known Allergies  Family History  Problem Relation Age of Onset  . Coronary artery disease Father        early 55's  . Diabetes Father   . Hypertension Father   . Breast cancer Mother 19       mets to colon and other  . Glaucoma Mother   . Prostate cancer Brother   . Heart disease Brother   . Diabetes Sister       Prior to Admission medications   Medication Sig Start Date End Date Taking? Authorizing Provider  aspirin 81 MG tablet Take 81 mg by mouth daily.     [provider]  clopidogrel (PLAVIX) 75 MG tablet Take 75 mg by mouth daily. 05/14/16   [provider]  dapagliflozin propanediol (FARXIGA) 10 MG TABS tablet Take 10 mg by mouth at bedtime.    [provider]  furosemide (LASIX) 40 MG tablet Take 1 tablet (40 mg total) by mouth daily. 08/07/20 11/05/20  Werner Lean, MD  insulin glargine (LANTUS) 100 UNIT/ML Solostar Pen Inject 70 Units into the skin daily before breakfast. 11/19/16   [provider]  metFORMIN (GLUCOPHAGE) 500 MG tablet Take 1 tablet (500 mg total) by mouth 2 (two) times daily with a meal. 07/09/20   End, Harrell Gave, MD  metoprolol succinate (TOPROL-XL) 50 MG 24 hr tablet TAKE ONE TABLET BY MOUTH ONE TIME DAILY. TAKE WITH OR IMMEDIATELY FOLLOWING A MEAL 06/12/20   [name], MD  nitroGLYCERIN (  NITROSTAT) 0.4 MG SL tablet Place 0.4 mg under the tongue every 5 (five) minutes as needed for chest pain.    [provider]  pantoprazole (PROTONIX) 40 MG tablet Take 40 mg by mouth daily before breakfast. 09/25/16   [provider]  ranolazine (RANEXA) 500 MG 12 hr tablet TAKE ONE TABLET BY MOUTH TWICE DAILY.PLEASE MAKE APPT WITH DR.HARDING FOR REFILLS.  04/15/19   [name], MD  rosuvastatin (CRESTOR) 40 MG tablet TAKE ONE TABLET BY MOUTH DAILY 08/30/20   [name], MD  Semaglutide, 1 MG/DOSE, (OZEMPIC, 1 MG/DOSE,) 4 MG/3ML SOPN Inject into the skin every Thursday.    [provider]    Physical Exam: Vitals:   09/08/20 2115 09/08/20 2130 09/08/20 2200 09/08/20 2345  BP: 113/80 121/86 102/74 (!) 158/96  Pulse: 84 85 88 92  Resp: (!) 24 (!) 22 (!) 21 (!) 23  Temp:      TempSrc:      SpO2: 99% 97% 96% 97%  Weight:      Height:        Constitutional: NAD, calm, comfortable Vitals:    09/08/20 2115 09/08/20 2130 09/08/20 2200 09/08/20 2345  BP: 113/80 121/86 102/74 (!) 158/96  Pulse: 84 85 88 92  Resp: (!) 24 (!) 22 (!) 21 (!) 23  Temp:      TempSrc:      SpO2: 99% 97% 96% 97%  Weight:      Height:       General: WDWN, Alert and oriented x3.  Eyes: EOMI, PERRL, conjunctivae normal.  Sclera nonicteric HENT:  [place]/AT, external ears normal.  Nares patent without epistasis.  Mucous membranes are moist. Neck: Soft, normal range of motion, supple, no masses, no thyromegaly. Trachea midline Respiratory: clear to auscultation bilaterally, no wheezing, no crackles. Normal respiratory effort. No accessory muscle use.  Cardiovascular: Regular rate and rhythm, no murmurs / rubs / gallops. No extremity edema. 2+ pedal pulses. No carotid bruits. Healed midline sternal incision.  Abdomen: Soft, no tenderness, nondistended, no rebound or guarding.  No masses palpated. Bowel sounds normoactive Musculoskeletal: FROM. no cyanosis. No joint deformity upper and lower extremities. Normal muscle tone.  Skin: Warm, dry, intact no rashes, lesions, ulcers. No induration Neurologic: CN 2-12 grossly intact.  Normal speech.  Sensation intact, patella DTR +1 bilaterally. Strength 2/5 in lower extremities.  Strength 5/5 in upper extremities Psychiatric: Normal judgment and insight.  Normal mood.    Labs on Admission: I have personally reviewed following labs and imaging studies  CBC: Recent Labs  Lab 09/08/20 1823  WBC 13.2*  NEUTROABS 9.4*  HGB 13.0  HCT 39.9  MCV 86.9  PLT 884    Basic Metabolic Panel: Recent Labs  Lab 09/06/20 0720 09/08/20 1823  NA 135 137  K 4.2 3.5  CL 95* 102  CO2 18* 19*  GLUCOSE 274* 134*  BUN 41* 55*  CREATININE 2.74* 2.80*  CALCIUM 9.4 9.2    GFR: Estimated Creatinine Clearance: 23.7 mL/min (A) (by C-G formula based on SCr of 2.8 mg/dL (H)).  Liver Function Tests: Recent Labs  Lab 09/08/20 1823  AST 208*  ALT 173*  ALKPHOS 58  BILITOT 1.6*   PROT 7.0  ALBUMIN 3.1*    Urine analysis:    Component Value Date/Time   COLORURINE YELLOW 09/06/2010 1730   APPEARANCEUR CLEAR 09/06/2010 1730   LABSPEC 1.035 (H) 09/06/2010 1730   PHURINE 5.5 09/06/2010 1730   HGBUR TRACE (A) 09/06/2010 1730  BILIRUBINUR NEGATIVE 09/06/2010 1730   KETONESUR 15 (A) 09/06/2010 1730   PROTEINUR 100 (A) 09/06/2010 1730   UROBILINOGEN 0.2 09/06/2010 1730   NITRITE NEGATIVE 09/06/2010 1730   LEUKOCYTESUR NEGATIVE 09/06/2010 1730    Radiological Exams on Admission: DG Chest Portable 1 View  Result Date: 09/08/2020 CLINICAL DATA:  Chest pain. EXAM: PORTABLE CHEST 1 VIEW COMPARISON:  September 12, 2010 FINDINGS: A multi lead AICD is noted. Multiple sternal wires are seen. The heart size and mediastinal contours are within normal limits. A radiopaque loop recorder device is seen. There is no evidence of acute infiltrate, pleural effusion or pneumothorax. Degenerative changes are seen throughout the thoracic spine. IMPRESSION: Stable exam without acute or active cardiopulmonary disease. Electronically Signed   By: Virgina Norfolk M.D.   On: 09/08/2020 18:50    EKG: Independently reviewed.  EKG shows normal sinus rhythm with minimal ST depression in the inferior leads.  QTc 497  Assessment/Plan Principal Problem:   NSTEMI (non-ST elevated myocardial infarction) Eugene [name] has NSTEMI with difficult troponin elevation to greater than 13,000.  We'll check serial troponins through the night. Cardiology has been consulted and appreciate their input. Obtain echocardiogram in the morning to evaluate for ischemic wall damage Heparin infusion has been initiated. Continue aspirin and Plavix.  Continue Ranexa.  Continue metoprolol. No signs of volume overload so Lasix will be held at this time with need for IV fluid hydration with rhabdomyolysis and CPK level greater than 8000  Active Problems:   Rhabdomyolysis IV fluid hydration with LR at 100 ml/hr overnight.   Recheck CPK level in the morning. May be secondary to statin therapy so Crestor will be held.  Patient does have mild elevated liver enzymes as well.  Is had no injury or trauma to his legs and has not had prolonged immobilization to explain the elevated CK level.    Type 2 diabetes mellitus with vascular disease  Continue basal insulin with Lantus.  Blood sugars were monitored with meals and at bedtime.  Sliding scale insulin provided as needed for glycemic control.  Continue home medication of Farxiga    CKD (chronic kidney disease) stage 4, GFR 15-29 ml/min  Stable.  Monitor electrolytes and renal function with labs in morning    Weakness of both legs Consult physical therapy in the morning. If weakness does not improve with IV fluid hydration and improvement in CPK will consult neurology    Coronary artery disease involving coronary bypass graft of native heart with angina pectoris  Continue home medications of beta-blocker, Ranexa, aspirin and Plavix.  Cardiology consulted and following    Unintended weight loss Obtain CT abdomen pelvis for further evaluation of potential underlying malignancy with unintended weight loss to be without contrast secondary to kidney function    Elevated LFTs Hold Crestor.  Recheck LFTs in morning with labs    Urinary weak stream Check PSA level    Prolong QT interval Avoid medications to get further prolong QT interval.  Monitor on telemetry.    Single implantable cardioverter-defibrillator (ICD) in situ Stable    DVT prophylaxis: Pt is on Heparin infusion for anticoagulation. Code Status:   Full code  Family Communication:  Diagnosis and plan discussed with patient.  Patient verbalized understanding agrees with plan.  Further recommendations to follow as clinical indicated Disposition Plan:   Patient is from:  Home  Anticipated DC to:  Home  Anticipated DC date:             Anticipate  2 midnight or more stay in the hospital to treat acute  condition  Anticipated DC barriers: No barriers to discharge identified at this time  Consults called:  Cardiology-Dr. Marcelle Smiling Admission status:  Inpatient  Yevonne Aline Eugene Youtsey MD [facility]  How to contact the [facility] Attending or Consulting provider [place] or covering provider during after hours [place], for this patient?   1. Check the care team in [facility] and look for a) attending/consulting [facility] provider listed and b) the [facility] team listed 2. Log into [email or web address] and use [place]'s universal password to access. If you do not have the password, please contact the hospital operator. 3. Locate the [facility] provider you are looking for under [facility] and page to a number that you can be directly reached. 4. If you still have difficulty reaching the provider, please page the [facility] (Director on Call) for the Hospitalists listed on amion for assistance.  09/09/2020, 12:18 AM

## 2020-09-09 NOTE — ED Notes (Signed)
Report given to Junie Panning, RN on 5W

## 2020-09-09 NOTE — Progress Notes (Signed)
  Echocardiogram 2D Echocardiogram has been performed.  Eugene [name] 09/09/2020, 5:58 PM

## 2020-09-09 NOTE — ED Notes (Signed)
CBG 113, not crossing over from glucometer

## 2020-09-09 NOTE — Progress Notes (Signed)
Progress Note  Patient Name: Eugene [name] Date of Encounter: 09/09/2020  Primary Cardiologist: Cristopher Peru, MD   Subjective   Overnight troponin peaked to 13,971.  Through this patient endorses so chest pain or SOB.  Presently has concern about his inability to move his legs.  Notes that he cannot go to work at [employer] with about being able to walk.  Inpatient Medications    Scheduled Meds: . aspirin EC  81 mg Oral Daily  . clopidogrel  75 mg Oral Daily  . insulin aspart  0-5 Units Subcutaneous QHS  . insulin aspart  0-9 Units Subcutaneous TID WC  . insulin glargine  70 Units Subcutaneous Daily  . metoprolol succinate  50 mg Oral Daily  . pantoprazole  40 mg Oral Q1200   Continuous Infusions: . cefTRIAXone (ROCEPHIN)  IV Stopped (09/09/20 1320)  . heparin 1,250 Units/hr (09/09/20 1247)  . lactated ringers 100 mL/hr at 09/09/20 0221   PRN Meds: acetaminophen, nitroGLYCERIN   Vital Signs    Vitals:   09/09/20 1347 09/09/20 1400 09/09/20 1430 09/09/20 1500  BP:  105/71 109/73 109/77  Pulse:  88 88 87  Resp:  (!) 24 (!) 22 (!) 27  Temp: 97.7 F (36.5 C)  98.3 F (36.8 C)   TempSrc: Oral  Oral   SpO2:  93% 96% 96%  Weight:      Height:        Intake/Output Summary (Last 24 hours) at 09/09/2020 1525 Last data filed at 09/09/2020 1320 Gross per 24 hour  Intake 100 ml  Output --  Net 100 ml   Filed Weights   09/08/20 1808  Weight: 73.9 kg    Telemetry    SR  - Personally Reviewed  ECG    Sr 86 1st HB, Anterior infarct pattern - Personally Reviewed  Physical Exam   GEN: No acute distress.   Neck: No JVD Cardiac: RRR, no murmurs, rubs, or gallops.  Respiratory: Clear to auscultation bilaterally. GI: Soft, nontender, non-distended  Skin: Non pitting edema (minimal), skin is warm without mottled apperance Neuro:  Unable to move left or right leg against resistance, no pain on exam Psych: Normal affect   Labs    Chemistry Recent Labs  Lab  09/06/20 0720 09/08/20 1823 09/09/20 0409  NA 135 137 136  K 4.2 3.5 2.8*  CL 95* 102 103  CO2 18* 19* 17*  GLUCOSE 274* 134* 89  BUN 41* 55* 51*  CREATININE 2.74* 2.80* 2.56*  CALCIUM 9.4 9.2 9.0  PROT  --  7.0 6.1*  ALBUMIN  --  3.1* 2.8*  AST  --  208* 182*  ALT  --  173* 154*  ALKPHOS  --  58 46  BILITOT  --  1.6* 1.3*  GFRNONAA 23* 24* 27*  GFRAA 27*  --   --   ANIONGAP  --  16* 16*     Hematology Recent Labs  Lab 09/08/20 1823 09/09/20 0409  WBC 13.2* 12.2*  RBC 4.59 4.32  HGB 13.0 12.3*  HCT 39.9 37.8*  MCV 86.9 87.5  MCH 28.3 28.5  MCHC 32.6 32.5  RDW 14.3 14.4  PLT 177 168    Cardiac EnzymesNo results for input(s): TROPONINI in the last 168 hours. No results for input(s): TROPIPOC in the last 168 hours.   BNPNo results for input(s): BNP, PROBNP in the last 168 hours.   DDimer No results for input(s): DDIMER in the last 168 hours.   Radiology  CT ABDOMEN PELVIS WO CONTRAST  Result Date: 09/09/2020 CLINICAL DATA:  Weight loss, and intended. EXAM: CT ABDOMEN AND PELVIS WITHOUT CONTRAST TECHNIQUE: Multidetector CT imaging of the abdomen and pelvis was performed following the standard protocol without IV contrast. COMPARISON:  None. FINDINGS: Lower chest: Coronary artery calcifications. Partially visualized cardiac defibrillator. Diffuse reticulations of the visualized lung bases as well as suggestion of emphysematous changes. Possible tiny hiatal hernia. Hepatobiliary: No focal liver abnormality. Several calcified gallstones within the gallbladder lumen. No gallbladder wall thickening or pericholecystic fluid. No biliary dilatation. Pancreas: No focal lesion. Normal pancreatic contour. No surrounding inflammatory changes. No main pancreatic ductal dilatation. Spleen: Normal in size without focal abnormality. Suggestion of a splenule (3:33). Adrenals/Urinary Tract: No adrenal nodule bilaterally. No nephrolithiasis, no hydronephrosis, and no contour-deforming  renal mass. No ureterolithiasis or hydroureter. Urinary bladder wall emphysema. Stomach/Bowel: PO contrast reaches the mid small bowel. Stomach is within normal limits. No evidence of bowel wall thickening or dilatation. Appendix appears normal. Vascular/Lymphatic: No abdominal aorta or iliac aneurysm. Mild atherosclerotic plaque of the aorta and its branches. No abdominal, pelvic, or inguinal lymphadenopathy. Reproductive: Prostate is unremarkable. Other: No intraperitoneal free fluid. No intraperitoneal free gas. No organized fluid collection. Musculoskeletal: No abdominal wall hernia or abnormality No suspicious lytic or blastic osseous lesions. No acute displaced fracture. Multilevel degenerative changes of the spine. IMPRESSION: 1. Emphysematous cystitis. 2. Please note limited evaluation of the abdomen and pelvis due to noncontrast study. 3. Other imaging findings of potential clinical significance: Fibrosis and emphysematous changes of the lungs. Aortic Atherosclerosis (ICD10-I70.0). Electronically Signed   By: Iven Finn M.D.   On: 09/09/2020 03:09   CT HEAD WO CONTRAST  Result Date: 09/09/2020 CLINICAL DATA:  Neurological deficit.  Acute stroke suspected. EXAM: CT HEAD WITHOUT CONTRAST TECHNIQUE: Contiguous axial images were obtained from the base of the skull through the vertex without intravenous contrast. COMPARISON:  06/01/2014 FINDINGS: Brain: Mild to moderate generalized brain volume loss. Numerous areas of low-density in the hemispheric white matter and basal ganglia consistent with chronic small vessel disease. No sign of acute infarction, mass lesion, hemorrhage, hydrocephalus or extra-axial collection. Vascular: There is atherosclerotic calcification of the major vessels at the base of the brain. Skull: Negative Sinuses/Orbits: Clear/normal Other: None IMPRESSION: No acute finding by CT. Atrophy and chronic small-vessel ischemic changes of the white matter and basal ganglia.  Electronically Signed   By: Nelson Chimes M.D.   On: 09/09/2020 00:55   CT CHEST WO CONTRAST  Result Date: 09/09/2020 CLINICAL DATA:  COVID positive. Generalized weakness. Retrosternal chest pain. EXAM: CT CHEST WITHOUT CONTRAST TECHNIQUE: Multidetector CT imaging of the chest was performed following the standard protocol without IV contrast. COMPARISON:  Chest radiograph from one day prior. FINDINGS: Cardiovascular: Top-normal heart size. Single lead left subclavian ICD with lead tip in the right ventricular apex. No significant pericardial effusion/thickening. Three-vessel coronary atherosclerosis status post CABG. Atherosclerotic nonaneurysmal thoracic aorta. Normal caliber pulmonary arteries. Mediastinum/Nodes: No discrete thyroid nodules. Unremarkable esophagus. No pathologically enlarged axillary, mediastinal or hilar lymph nodes, noting limited sensitivity for the detection of hilar adenopathy on this noncontrast study. Lungs/Pleura: No pneumothorax. No pleural effusion. Mild centrilobular emphysema. Solid 5 mm right middle lobe pulmonary nodule along the minor fissure (series 5/image 75). No acute consolidative airspace disease, lung masses or additional significant pulmonary nodules. Extensive patchy confluent peripheral reticulation and ground-glass opacity throughout both lungs with associated moderate traction bronchiectasis, architectural distortion and volume loss. There is a basilar predominance to these  findings. No frank honeycombing. Upper abdomen: Cholelithiasis. Musculoskeletal: No aggressive appearing focal osseous lesions. Intact sternotomy wires. Marked thoracic spondylosis. IMPRESSION: 1. 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 Suggest outpatient pulmonology consultation and follow-up high-resolution chest CT study in 6-12 months. Findings are categorized as probable UIP per consensus guidelines: Diagnosis of  Idiopathic Pulmonary Fibrosis: An Official ATS/ERS/JRS/ALAT Clinical Practice Guideline. [place], Iss 5, [phone], Mar 29 2017. 2. Solid 5 mm right middle lobe pulmonary nodule along the minor fissure. Follow-up noncontrast chest CT recommended in 12 months in this high risk patient. This recommendation follows the consensus statement: Guidelines for Management of Incidental Pulmonary Nodules Detected on CT Images: From the Fleischner Society 2017; Radiology 2017; 284:228-243. 3. Cholelithiasis. 4. Aortic Atherosclerosis (ICD10-I70.0) and Emphysema (ICD10-J43.9). Electronically Signed   By: Ilona Sorrel M.D.   On: 09/09/2020 12:08   DG Chest Portable 1 View  Result Date: 09/08/2020 CLINICAL DATA:  Chest pain. EXAM: PORTABLE CHEST 1 VIEW COMPARISON:  September 12, 2010 FINDINGS: A multi lead AICD is noted. Multiple sternal wires are seen. The heart size and mediastinal contours are within normal limits. A radiopaque loop recorder device is seen. There is no evidence of acute infiltrate, pleural effusion or pneumothorax. Degenerative changes are seen throughout the thoracic spine. IMPRESSION: Stable exam without acute or active cardiopulmonary disease. Electronically Signed   By: Virgina Norfolk M.D.   On: 09/08/2020 18:50   VAS [place] LOWER EXTREMITY VENOUS (DVT) (ONLY [facility] & [facility])  Result Date: 09/09/2020  Lower Venous DVT Study Indications: Covid-19.  Limitations: Shadowing from arterial plaque, clothing. Comparison Study: No prior study Performing Technologist: Sharion Dove RVS  Examination Guidelines: A complete evaluation includes B-mode imaging, spectral Doppler, color Doppler, and power Doppler as needed of all accessible portions of each vessel. Bilateral testing is considered an integral part of a complete examination. Limited examinations for reoccurring indications may be performed as noted. The reflux portion of the exam is performed with the patient in reverse Trendelenburg.   +---------+---------------+---------+-----------+----------+-------------------+ RIGHT    CompressibilityPhasicitySpontaneityPropertiesThrombus Aging      +---------+---------------+---------+-----------+----------+-------------------+ CFV                                                   Not well visualized +---------+---------------+---------+-----------+----------+-------------------+ SFJ                                                   Not well visualized +---------+---------------+---------+-----------+----------+-------------------+ FV Prox  Full           Yes      Yes                                      +---------+---------------+---------+-----------+----------+-------------------+ FV Mid   Full                                                             +---------+---------------+---------+-----------+----------+-------------------+  FV DistalFull                                                             +---------+---------------+---------+-----------+----------+-------------------+ PFV                                                   Not well visualized +---------+---------------+---------+-----------+----------+-------------------+ POP      Partial        Yes      Yes                  Acute               +---------+---------------+---------+-----------+----------+-------------------+ PTV      Full                                                             +---------+---------------+---------+-----------+----------+-------------------+ PERO     None                                         Acute               +---------+---------------+---------+-----------+----------+-------------------+ Gastroc  None                                         Acute               +---------+---------------+---------+-----------+----------+-------------------+   +---------+---------------+---------+-----------+----------+-------------------+  LEFT     CompressibilityPhasicitySpontaneityPropertiesThrombus Aging      +---------+---------------+---------+-----------+----------+-------------------+ CFV                                                   Not well visualized +---------+---------------+---------+-----------+----------+-------------------+ SFJ                                                   Not well visualized +---------+---------------+---------+-----------+----------+-------------------+ FV Prox  Full           Yes      Yes                                      +---------+---------------+---------+-----------+----------+-------------------+ FV Mid   Full                                                             +---------+---------------+---------+-----------+----------+-------------------+  FV DistalFull           Yes      Yes                                      +---------+---------------+---------+-----------+----------+-------------------+ PFV                                                   Not well visualized +---------+---------------+---------+-----------+----------+-------------------+ POP      Full           Yes      Yes                                      +---------+---------------+---------+-----------+----------+-------------------+ PTV      Full                                                             +---------+---------------+---------+-----------+----------+-------------------+ PERO     Full                                                             +---------+---------------+---------+-----------+----------+-------------------+    Summary: RIGHT: - Findings consistent with acute deep vein thrombosis involving the right popliteal vein, right peroneal veins, and right gastrocnemius veins. - There is no evidence of deep vein thrombosis in the lower extremity. - There is no evidence of superficial venous thrombosis.  LEFT: - No evidence of common femoral  vein obstruction.  *See table(s) above for measurements and observations.    Preliminary     Cardiac Studies   Echo Pending  Patient Profile     65 y.o. male with new leg weakness, NSTEMI and COVID  Assessment & Plan    NSTEMI in the setting of COVID-19 (demand ischemia) DM with HTN, DM with HLD PAD Transaminitis - continue ASA, Plavix, and Heparin for at least 48 hours - suspicious for R Leg DVT in which temporary triple therapy would be considered (as patient has no PCI or Bypass targets) - continue PP - Conntinue PRN Nitro - unclear cause of transaminitis; being worked up by primary, statin is held; would trend LFTs  HFpEF CKD Stage IIIb - will follow up echo - at some point through course will need return of diuresis (lasix and Farxiga)  For questions or updates, please contact [place] Please consult [email or web address] for contact info under Cardiology/STEMI.      Signed, Werner Lean, MD  09/09/2020, 3:25 PM

## 2020-09-09 NOTE — Progress Notes (Signed)
[facility] [place] for Heparin Indication: chest pain/ACS  No Known Allergies  Patient Measurements: Height: 5\' 6"  (167.6 cm) Weight: 73.9 kg (163 lb) IBW/kg (Calculated) : 63.8 Heparin Dosing Weight: 73.9 kg  Vital Signs: Temp: 98.1 F (36.7 C) (02/11 1809) Temp Source: Oral (02/11 1809) BP: 114/83 (02/12 0330) Pulse Rate: 88 (02/12 0330)  Labs: Recent Labs    09/06/20 0720 09/08/20 1823 09/08/20 2121 09/09/20 0409  HGB  --  13.0  --  12.3*  HCT  --  39.9  --  37.8*  PLT  --  177  --  168  HEPARINUNFRC  --   --   --  0.15*  CREATININE 2.74* 2.80*  --   --   CKTOTAL  --   --  8,423*  --   TROPONINIHS  --  10,598* 13,971*  --     Estimated Creatinine Clearance: 23.7 mL/min (A) (by C-G formula based on SCr of 2.8 mg/dL (H)).  Assessment: 65 y.o. male with chest pain for heparin  Goal of Therapy:  Heparin level 0.3-0.7 units/ml Monitor platelets by anticoagulation protocol: Yes   Plan:  Heparin 2000 units IV bolus, then increase heparin  1200 units/hr Check heparin level in 8 hours.  Phillis Knack, PharmD, BCPS

## 2020-09-09 NOTE — ED Notes (Signed)
Attempted to give report and was told the nurse went to lunch and they'll call me back

## 2020-09-09 NOTE — ED Notes (Signed)
Patient transported to CT 

## 2020-09-09 NOTE — Consult Note (Signed)
NEURO HOSPITALIST CONSULT NOTE   Requestig physician: Dr. Sloan Leiter  Reason for Consult: Progressively worsening asymmetric BLE weakness  History obtained from:  Patient and Chart    HPI:                                                                                                                                          Eugene [name] is an 65 y.o. 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   The patient was brought to the ED by [facility] from home. He has been experiencing icreasing bilateral leg weakness over approximately the past 10 months. Now also with numbness and pain, difficulty standing today in conjunction with intermittent substernal CP. Denied injury/fall. Also reports intermittent chest pain x 1 week, that is relieved with 2 NTG. He has a cardiac history and stents. Notes that he has had subacutely increasing difficulty with leg weakness starting about a week ago this is progressed to the point where he is unable to stand.  ROS rremarkable for a 60 pound unintentional weight loss over approximately the past 14 months. He also endorses gradual symmetrical wasting of his thigh musculature more so than upper extremity muscles over the same time period.   Additional history obtained from chart: "Patient states that about 2 months ago he had two new cardiac stents placed.  He states that for about 1 month he was no longer experiencing any chest pain or shortness of breath.  About 2 weeks ago his symptoms began once again recur.  He states about 6 days ago he felt as if he was having daily, intermittent, central chest pain that was similar to prior episodes.  Has been intermittently taking nitroglycerin which provides relief.  Reports associated  shortness of breath that worsens with exertion.  No leg swelling.  States he has been compliant with all of his medications.  Lastly, patient notes diffuse leg pain for the past few days. States it worsens with any movement of the lower extremities.  Unsure of the source of his pain.  Denies any back pain.  No injury.  Also reports associated weakness in the lower legs.  States that he has having difficulty now standing and ambulating due to pain and weakness."   CK level in the ED was > 8000. The patient was diagnosed with rhabdomyolysis and started on IVF. It was thought possibly to be secondary to statin therapy, so Crestor was held. He also had mildly elevated LFTs. No recent trauma to legs or prolonged immobilization to explain the elevated  CK level.   He was diagnosed with NSTEMI by Cardiology. His troponin was incredibly elevated, which was difficult for Cardiology to explain. He clinically was not having any signs of acute coronary syndrome with no CP at the time of ED evaluation by Cardiology. He has been started on IV heparin and continued on ASA and Plavix per Cardiology recommendations.   Past Medical History:  Diagnosis Date  . Abnormal nuclear stress test 02/19/2018  . Angina, class III ([place]) 02/19/2018  . Anxiety   . Asthma   . CHF (congestive heart failure) ([place])   . Coronary artery disease   . DDD (degenerative disc disease), cervical 03/01/2013  . Diabetes mellitus    type 2  . 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 Dyslipidemia   . Hypertension   . Lumbosacral radiculopathy at L4 02/18/2017  . Myocardial infarction ([place]) 2012  . Obesity   . Sleep apnea 08/17/2013  . Snoring 10/12/2013  . TIA (transient ischemic attack) 08/09/2014  . Tubular adenoma of colon 01/12/2014   transverse colon  . VF (ventricular fibrillation) ([place])    arrest  . Vitamin D deficiency 02/18/2017    Past Surgical History:  Procedure Laterality Date  . ABDOMINAL AORTAGRAM N/A 12/14/2013   Procedure:  ABDOMINAL Maxcine Ham;  Surgeon: [name], MD;  Location: [facility] CATH LAB;  Service: Cardiovascular;  Laterality: N/A;  . CARDIAC CATHETERIZATION  09/16/10  . COLONOSCOPY  01/12/2014  . CORONARY ARTERY BYPASS GRAFT  2006  . CORONARY STENT INTERVENTION  07/06/2020  . CORONARY STENT INTERVENTION N/A 07/06/2020   Procedure: CORONARY STENT INTERVENTION;  Surgeon: Nelva Bush, MD;  Location: [place] CV LAB;  Service: Cardiovascular;  Laterality: N/A;  . IMPLANTABLE CARDIOVERTER DEFIBRILLATOR IMPLANT  2012   STJ ICD- single chamber  . LEFT HEART CATH AND CORS/GRAFTS ANGIOGRAPHY N/A 02/19/2018   Procedure: LEFT HEART CATH AND CORS/GRAFTS ANGIOGRAPHY;  Surgeon: [name], MD;  Location: [place] CV LAB;  Service: Cardiovascular;  Laterality: N/A;  . LEFT HEART CATH AND CORS/GRAFTS ANGIOGRAPHY N/A 07/06/2020   Procedure: LEFT HEART CATH AND CORS/GRAFTS ANGIOGRAPHY;  Surgeon: Nelva Bush, MD;  Location: [place] CV LAB;  Service: Cardiovascular;  Laterality: N/A;  . LOOP RECORDER IMPLANT N/A 08/24/2014   Procedure: LOOP RECORDER IMPLANT;  Surgeon: [name], MD;  Location: [facility] CATH LAB;  Service: Cardiovascular;  Laterality: N/A;  . MASS EXCISION  2008   right shoulder  . STERNECTOMY      Family History  Problem Relation Age of Onset  . Coronary artery disease Father        early 35's  . Diabetes Father   . Hypertension Father   . Breast cancer Mother 69       mets to colon and other  . Glaucoma Mother   . Prostate cancer Brother   . Heart disease Brother   . Diabetes Sister               Social History:  reports that he has never smoked. He has never used smokeless tobacco. He reports that he does not drink alcohol and does not use drugs.  No Known Allergies  MEDICATIONS:  No current facility-administered medications on file prior to encounter.    Current Outpatient Medications on File Prior to Encounter  Medication Sig Dispense Refill  . aspirin 81 MG tablet Take 81 mg by mouth daily.     . clopidogrel (PLAVIX) 75 MG tablet Take 75 mg by mouth daily.    . dapagliflozin propanediol (FARXIGA) 10 MG TABS tablet Take 10 mg by mouth at bedtime.    . furosemide (LASIX) 40 MG tablet Take 1 tablet (40 mg total) by mouth daily. 90 tablet 3  . insulin glargine (LANTUS) 100 UNIT/ML Solostar Pen Inject 70 Units into the skin daily before breakfast.    . metFORMIN (GLUCOPHAGE) 500 MG tablet Take 1 tablet (500 mg total) by mouth 2 (two) times daily with a meal.    . methocarbamol (ROBAXIN) 500 MG tablet Take 500 mg by mouth 2 (two) times daily.    . metoprolol succinate (TOPROL-XL) 50 MG 24 hr tablet TAKE ONE TABLET BY MOUTH ONE TIME DAILY. TAKE WITH OR IMMEDIATELY FOLLOWING A MEAL (Patient taking differently: Take 50 mg by mouth daily.) 90 tablet 1  . nitroGLYCERIN (NITROSTAT) 0.4 MG SL tablet Place 0.4 mg under the tongue every 5 (five) minutes as needed for chest pain.    . pantoprazole (PROTONIX) 40 MG tablet Take 40 mg by mouth daily before breakfast.    . ranolazine (RANEXA) 500 MG 12 hr tablet TAKE ONE TABLET BY MOUTH TWICE DAILY.PLEASE MAKE APPT WITH DR.HARDING FOR REFILLS.  (Patient taking differently: Take 500 mg by mouth 2 (two) times daily.) 60 tablet 2  . rosuvastatin (CRESTOR) 40 MG tablet TAKE ONE TABLET BY MOUTH DAILY 90 tablet 3  . Semaglutide, 1 MG/DOSE, (OZEMPIC, 1 MG/DOSE,) 4 MG/3ML SOPN Inject 1 mg into the skin every Tuesday.       Scheduled: . aspirin EC  81 mg Oral Daily  . clopidogrel  75 mg Oral Daily  . insulin aspart  0-5 Units Subcutaneous QHS  . insulin aspart  0-9 Units Subcutaneous TID WC  . insulin glargine  70 Units Subcutaneous Daily  . metoprolol succinate  50 mg Oral Daily  . pantoprazole  40 mg Oral Q1200   Continuous: . cefTRIAXone (ROCEPHIN)  IV Stopped (09/09/20 1320)  . heparin 1,250 Units/hr  (09/09/20 1247)  . lactated ringers 100 mL/hr at 09/09/20 0221     ROS:                                                                                                                                       As per HPI. Does not endorse additional symptoms.   Blood pressure 103/75, pulse 89, temperature (!) 97.5 F (36.4 C), temperature source Oral, resp. rate (!) 23, height 5' 6"  (1.676 m), weight 73.9 kg, SpO2 94 %.   General Examination:  Physical Exam  HEENT-  [place]/AT   Lungs- Respirations unlabored Extremities- No edema. Decreased muscle bulk to thighs and BUE noted.   Neurological Examination Mental Status:  Alert, fully oriented, thought content appropriate.  Speech fluent without evidence of aphasia.  Able to follow all commands without difficulty. Cranial Nerves: II: Visual fields intact with no extinction to DSS.   III,IV, VI: EOMI. No ptosis.  V: Facial temp sensation equal bilaterally.  VII: Smile symmetric. No bulbar weakness noted.  VIII: hearing intact to voice IX,X: No hypophonia or nasal speech XI: Symmetric shoulder shrug XII: midline tongue extension without evidence of weakness Motor: BUE with 4/5 strength to deltoids, biceps, triceps and grip BLE with 2/5 HF, 3/5 knee flexion, 4-/5 knee extension, 4-/5 ADF/APF No pronator drift Sensory: Temp and light touch intact throughout, bilaterally Deep Tendon Reflexes: 2+ bilateral brachioradialis and biceps. 1+ patellae. 0 achilles bilaterally.  Plantars: Upgoing bilaterally, but response is not brisk  Cerebellar: No ataxia with FNF bilaterally Gait: Unable to assess due to weakness   Lab Results: Basic Metabolic Panel: Recent Labs  Lab 09/06/20 0720 09/08/20 1823 09/09/20 0409  NA 135 137 136  K 4.2 3.5 2.8*  CL 95* 102 103  CO2 18* 19* 17*  GLUCOSE 274* 134* 89  BUN 41* 55* 51*  CREATININE 2.74* 2.80*  2.56*  CALCIUM 9.4 9.2 9.0    CBC: Recent Labs  Lab 09/08/20 1823 09/09/20 0409  WBC 13.2* 12.2*  NEUTROABS 9.4*  --   HGB 13.0 12.3*  HCT 39.9 37.8*  MCV 86.9 87.5  PLT 177 168    Cardiac Enzymes: Recent Labs  Lab 09/08/20 2121 09/09/20 0409  CKTOTAL 8,423* 6,921*    Lipid Panel: Recent Labs  Lab 09/09/20 0409  CHOL 117  TRIG 211*  HDL 29*  CHOLHDL 4.0  VLDL 42*  LDLCALC 46    Imaging: CT ABDOMEN PELVIS WO CONTRAST  Result Date: 09/09/2020 CLINICAL DATA:  Weight loss, and intended. EXAM: CT ABDOMEN AND PELVIS WITHOUT CONTRAST TECHNIQUE: Multidetector CT imaging of the abdomen and pelvis was performed following the standard protocol without IV contrast. COMPARISON:  None. FINDINGS: Lower chest: Coronary artery calcifications. Partially visualized cardiac defibrillator. Diffuse reticulations of the visualized lung bases as well as suggestion of emphysematous changes. Possible tiny hiatal hernia. Hepatobiliary: No focal liver abnormality. Several calcified gallstones within the gallbladder lumen. No gallbladder wall thickening or pericholecystic fluid. No biliary dilatation. Pancreas: No focal lesion. Normal pancreatic contour. No surrounding inflammatory changes. No main pancreatic ductal dilatation. Spleen: Normal in size without focal abnormality. Suggestion of a splenule (3:33). Adrenals/Urinary Tract: No adrenal nodule bilaterally. No nephrolithiasis, no hydronephrosis, and no contour-deforming renal mass. No ureterolithiasis or hydroureter. Urinary bladder wall emphysema. Stomach/Bowel: PO contrast reaches the mid small bowel. Stomach is within normal limits. No evidence of bowel wall thickening or dilatation. Appendix appears normal. Vascular/Lymphatic: No abdominal aorta or iliac aneurysm. Mild atherosclerotic plaque of the aorta and its branches. No abdominal, pelvic, or inguinal lymphadenopathy. Reproductive: Prostate is unremarkable. Other: No intraperitoneal free  fluid. No intraperitoneal free gas. No organized fluid collection. Musculoskeletal: No abdominal wall hernia or abnormality No suspicious lytic or blastic osseous lesions. No acute displaced fracture. Multilevel degenerative changes of the spine. IMPRESSION: 1. Emphysematous cystitis. 2. Please note limited evaluation of the abdomen and pelvis due to noncontrast study. 3. Other imaging findings of potential clinical significance: Fibrosis and emphysematous changes of the lungs. Aortic Atherosclerosis (ICD10-I70.0). Electronically Signed   By: Iven Finn  M.D.   On: 09/09/2020 03:09   CT HEAD WO CONTRAST  Result Date: 09/09/2020 CLINICAL DATA:  Neurological deficit.  Acute stroke suspected. EXAM: CT HEAD WITHOUT CONTRAST TECHNIQUE: Contiguous axial images were obtained from the base of the skull through the vertex without intravenous contrast. COMPARISON:  06/01/2014 FINDINGS: Brain: Mild to moderate generalized brain volume loss. Numerous areas of low-density in the hemispheric white matter and basal ganglia consistent with chronic small vessel disease. No sign of acute infarction, mass lesion, hemorrhage, hydrocephalus or extra-axial collection. Vascular: There is atherosclerotic calcification of the major vessels at the base of the brain. Skull: Negative Sinuses/Orbits: Clear/normal Other: None IMPRESSION: No acute finding by CT. Atrophy and chronic small-vessel ischemic changes of the white matter and basal ganglia. Electronically Signed   By: Nelson Chimes M.D.   On: 09/09/2020 00:55   DG Chest Portable 1 View  Result Date: 09/08/2020 CLINICAL DATA:  Chest pain. EXAM: PORTABLE CHEST 1 VIEW COMPARISON:  September 12, 2010 FINDINGS: A multi lead AICD is noted. Multiple sternal wires are seen. The heart size and mediastinal contours are within normal limits. A radiopaque loop recorder device is seen. There is no evidence of acute infiltrate, pleural effusion or pneumothorax. Degenerative changes are seen  throughout the thoracic spine. IMPRESSION: Stable exam without acute or active cardiopulmonary disease. Electronically Signed   By: Virgina Norfolk M.D.   On: 09/08/2020 18:50    Assessment: 64 year old male with progressive asymmetric bilateral lower extremity weakness 1. Exam reveals weakness in BLE worse than upper extremities, in addition to muscle wasting symmetrically to thighs more so than proximal upper extremities. Relative sparing of distal limb muscles. No significant TTP to muscle beds. No rash or swelling noted.  2. CT head: No acute finding by CT. Atrophy and chronic small-vessel ischemic changes of the white matter and basal ganglia are noted.  3. He has tested positive for Covid but is asymptomatic.  4. He has been diagnosed with an MI this admission.  5. Elevated CK of 6921. DDx includes a myositis or myopathy. This may also be due to his MI.  6. Impaired renal function. BUN 51 and Cr 2.56 with eGFR of 23.  7. Potassium is low at 2.8. This may be contributing to his weakness.  8. Recent echocardiogram revealed a pericardial effusion. Left ventricular ejection fraction, by estimation, was 50 to 55%.  9. Lower extremity weakness and unintentional weight loss. Agree with Cardiology that given this combination of symptoms there is some concern for an undiagnosed underlying malignancy with a paraneoplastic syndrome (I.e.dermatomyositis/polymyositis). Also agree that the DDx includes a statin myopathy given liver injury pattern, elevated CK and proximal muscle weakness.   Recommendations: 1. Agree with holding statin.  2. Will need muscle biopsy, preferably of quadriceps or hamstrings. Will need General Surgery consult for this.  2. Monitor CK levels daily.  4. MRI of the thoracic and lumbar spine without contrast.  5. ANA, RPR, ESR, CRP, vitamin B12, vitamin B1 and copper levels.  6. May need CT of chest to assess for mass. Abdomen/pelvis CT was compromised by inability to  administer contrast. If renal function improves, would rescan with contrast.  7. Sarcoidosis can also present with a myopathy. Obtain serum ACE level. CT chest as above. 8. If above work up is negative (other than muscle biopsy, which will take time to result) obtain MRI of the thigh muscles to assess for possible inflammatory changes.      Electronically signed: Dr.  Kerney Elbe 09/09/2020, 10:06 AM

## 2020-09-09 NOTE — Progress Notes (Signed)
VASCULAR LAB    Bilateral lower extremity venous duplex has been performed.  See CV proc for preliminary results.  Messaged results via secure chat to Dr. Sloan Leiter.  Marney Treloar, RVT 09/09/2020, 1:57 PM

## 2020-09-09 NOTE — Progress Notes (Signed)
[place] for Heparin Indication: chest pain/ACS  No Known Allergies  Patient Measurements: Height: 5\' 6"  (167.6 cm) Weight: 73.9 kg (163 lb) IBW/kg (Calculated) : 63.8 Heparin Dosing Weight: 73.9 kg  Vital Signs: Temp: 97.5 F (36.4 C) (02/12 0855) Temp Source: Oral (02/12 0855) BP: 107/75 (02/12 1100) Pulse Rate: 91 (02/12 1100)  Labs: Recent Labs    09/08/20 1823 09/08/20 2121 09/09/20 0409 09/09/20 1206  HGB 13.0  --  12.3*  --   HCT 39.9  --  37.8*  --   PLT 177  --  168  --   HEPARINUNFRC  --   --  0.15* 0.31  CREATININE 2.80*  --  2.56*  --   CKTOTAL  --  8,423* 6,921*  --   TROPONINIHS 10,598* 13,971* 6,373*  --     Estimated Creatinine Clearance: 26 mL/min (A) (by C-G formula based on SCr of 2.56 mg/dL (H)).  Assessment: 65 y.o. male with chest pain for heparin - no AC PTA.  Heparin level is therapeutic at 0.31, on 1200 units/hr. Hgb 12.3, plt 168. Trop trending down from 16837>2902. No s/sx of bleeding or infusion issues.   Goal of Therapy:  Heparin level 0.3-0.7 units/ml Monitor platelets by anticoagulation protocol: Yes   Plan:  Will increase heparin infusion to 1250 units/hr to keep in goal range Confirm heparin level in 8 hours Monitor CBC, HL, and for s/sx of bleeding.  Antonietta Jewel, PharmD, [place] Clinical Pharmacist  Phone: [phone] 09/09/2020 12:37 PM  Please check AMION for all [place] phone numbers After 10:00 PM, call [place] [phone]

## 2020-09-09 NOTE — ED Notes (Signed)
Attempted to give report and was told the charge nurse didn't approve it yet.

## 2020-09-09 NOTE — ED Notes (Signed)
Attempted to give to floorx2 and was told to call back in 20 mins, because the nurse was at lunch. I asked to speak with the charge nurse, but was told she was not on the floor. I notified the [facility] of the situation.

## 2020-09-09 NOTE — CV Procedure (Signed)
2D echo attempted, but RN stated he was transferring, try echo later

## 2020-09-09 NOTE — Progress Notes (Signed)
PT Cancellation Note  Patient Details Name: Eugene [name] MRN: [id number] DOB: [birth year]   Cancelled Treatment:    Reason Eval/Treat Not Completed: Patient at procedure or test/unavailable.  Pt is out of ED room and gone in transit, will retry as time and pt allow.   Ramond Dial 09/09/2020, 3:15 PM  Mee Hives, PT MS Acute Rehab Dept. Number: [place] [facility] [place]

## 2020-09-09 NOTE — Progress Notes (Signed)
ANTICOAGULATION CONSULT NOTE  Pharmacy Consult for Heparin Indication: chest pain/ACS  No Known Allergies  Patient Measurements: Height: 5\' 6"  (167.6 cm) Weight: 73.9 kg (163 lb) IBW/kg (Calculated) : 63.8 Heparin Dosing Weight: 73.9 kg  Vital Signs: Temp: 98.3 F (36.8 C) (02/12 2000) Temp Source: Oral (02/12 2000) BP: 96/60 (02/12 2000) Pulse Rate: 84 (02/12 2000)  Labs: Recent Labs    09/08/20 1823 09/08/20 2121 09/09/20 0409 09/09/20 1206 09/09/20 1643 09/09/20 2048  HGB 13.0  --  12.3*  --   --   --   HCT 39.9  --  37.8*  --   --   --   PLT 177  --  168  --   --   --   HEPARINUNFRC  --   --  0.15* 0.31  --  0.34  CREATININE 2.80*  --  2.56*  --   --   --   CKTOTAL  --  8,423* 6,921*  --  3,499*  --   CKMB  --   --   --   --  28.2*  --   TROPONINIHS 10,598* 13,971* 6,373*  --   --   --     Estimated Creatinine Clearance: 26 mL/min (A) (by C-G formula based on SCr of 2.56 mg/dL (H)).  Assessment: 65 y.o. male with chest pain to continue on IV heparin.  Heparin level is therapeutic; no bleeding reported.  Goal of Therapy:  Heparin level 0.3-0.7 units/ml Monitor platelets by anticoagulation protocol: Yes   Plan:  Increase heparin infusion to 1300 units/hr to keep in goal range F/u AM labs  Glenis Musolf D. Mina Marble, PharmD, BCPS, [place] 09/09/2020, 9:25 PM

## 2020-09-09 NOTE — Progress Notes (Addendum)
PROGRESS NOTE                                                                                                                                                                                                             Patient Demographics:    Eugene [name], is a 65 y.o. male, DOB - [birth year], JKQ:[id number]  Outpatient Primary MD for the patient is [name], MD   Admit date - 09/08/2020   LOS - 0  Chief Complaint  Patient presents with  . Weakness       Brief Narrative: Patient is a 65 y.o. male with PMHx of history of CAD s/p CABG-subsequent recent PCI in 06/2020-V. fib arrest-s/p ICD implantation, CKD stage IV, HTN, DM, HLD who presented to the hospital with 1-2-week history of retrosternal chest pain responsive to nitroglycerin tablets-and 1 week history of bilateral lower extremity weakness.  Per patient-approximately 6 months ago-he noted that he was having trouble walking up a flight of stairs-and had actually put a ramp to get to his house from his garage.  He also acknowledges having unintentional weight loss-around 40-50 pounds in the past 1 year  COVID-19 vaccinated status: Vaccinated-J&J vaccine-but not boosted.  Significant Events: 2/11>> Admit to [facility] for non-STEMI-and bilateral lower extremity weakness.  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  He acknowledges having  several episodes of chest pain for approximately 10 days-chest pains completely resolved after sublingual nitroglycerin.  He does acknowledge that around 6 months ago he started having difficulty climbing stairs.  He has lost around 40 pounds in the past 1 year.   Assessment  & Plan :   Non-STEMI: Chest pain-free-continue medical management with aspirin/Plavix/beta-blocker/IV heparin.  Statins on hold due to significantly elevated CK levels.  Has extensive history of CAD-s/p CABG-and most recent PCI in December 2021.  Await further input from cardiology.  Bilateral lower extremity weakness: Going on for at  least 6 months-but markedly worse for the past 1 week or so.  Per history-patient noted that he was having difficulty climbing stairs 6 months back.  He is just about able to lift his legs off the bed against gravity-and mild resistance-bilateral knee reflexes are positive but weak.  He has no back pain.  Has had 40-50 pounds of unintentional weight loss this past year.  CK significantly elevated.  TSH/ESR/vitamin B12 ordered-have asked neurology to evaluate and provide further recommendations.  He probably needs muscle biopsy at some point-to rule out polymyositis-however he is on dual antiplatelet agents-and recently had a PCI done in December 2021.  Addendum:  1.  Lower extremity Doppler positive for DVT-already on IV heparin.  2.  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.  Transaminitis: Likely related to rhabdomyolysis-CK elevation.  No RUQ pain.  Complicated UTI: Probably related to diabetes-start Rocephin-have asked RN to send urine culture before starting antibiotics. He does acknowledge dysuria for the past several weeks.  Unintentional 40-50 pound weight loss in the past 1 year: Concern for undiagnosed malignancy-apparently was supposed to get colonoscopy-but due to recent PCI/dual  antiplatelet agents this was placed on hold. Noncontrast CT abdomen/pelvis without any obvious malignancy. Will obtain noncontrast CT as well. Await TSH. If inpatient work-up is negative-further work-up will need to be completed in the outpatient setting.  CKD stage IV: Creatinine at baseline-follow-avoid nephrotoxic agents  Hypokalemia: Replete and recheck.  DM-2 (A1c 7.7 on 07/06/2020): Morning CBG only at 76-we will decrease Lantus to 60 units, continue SSI. Follow and adjust.   Recent Labs    09/09/20 0820  GLUCAP 76   COVID-19 infection: Incidental-asymptomatic-Remdesivir x3 days.  Lab Results  Component Value Date   SARSCOV2NAA POSITIVE (A) 09/09/2020   [place] NEGATIVE 07/05/2020      GI prophylaxis: PPI  ABG:    Component Value Date/Time   PHART 7.369 09/09/2010 0341   PCO2ART 41.0 09/09/2010 0341   PO2ART 84.0 09/09/2010 0341   HCO3 25.3 (H) 09/09/2010 0907   TCO2 22 12/14/2013 0625   ACIDBASEDEF 1.0 09/09/2010 0907   O2SAT 96.0 09/09/2010 0907    Vent Settings: N/A  Condition - Guarded  Family Communication  :  [name]-Significant other-[phone]-updated on 2/12  Code Status :  Full Code  Diet :  Diet Order            Diet heart healthy/carb modified Room service appropriate? Yes; Fluid consistency: Thin  Diet effective now                  Disposition Plan  :   Status is: Inpatient  Remains inpatient appropriate because:Inpatient level of care appropriate due to severity of illness   Dispo: The patient is from: Home              Anticipated d/c is to: TBD              Anticipated d/c date is: > 3 days              Patient currently is not medically stable to d/c.   Difficult to place patient No         Barriers to discharge: Hypoxia requiring O2 supplementation/complete 5 days of IV Remdesivir  Antimicorbials  :    Anti-infectives (From admission, onward)   None      Inpatient Medications  Scheduled Meds: . aspirin EC   81 mg Oral Daily  .  clopidogrel  75 mg Oral Daily  . insulin aspart  0-5 Units Subcutaneous QHS  . insulin aspart  0-9 Units Subcutaneous TID WC  . insulin glargine  70 Units Subcutaneous Daily  . metoprolol succinate  50 mg Oral Daily  . potassium chloride  40 mEq Oral Q6H  . ranolazine  500 mg Oral BID   Continuous Infusions: . heparin 1,200 Units/hr (09/09/20 0506)  . lactated ringers 100 mL/hr at 09/09/20 0221   PRN Meds:.acetaminophen, nitroGLYCERIN   Time Spent in minutes  25   See all Orders from today for further details   Oren Binet M.D on 09/09/2020 at 10:36 AM  To page go to [email or web address] - use universal password  [facility] -  Office  [phone]    Objective:   Vitals:   09/09/20 0800 09/09/20 0855 09/09/20 1029 09/09/20 1030  BP: 103/75  105/74 105/74  Pulse: 89  93 93  Resp: (!) 23   (!) 24  Temp:  (!) 97.5 F (36.4 C)    TempSrc:  Oral    SpO2: 94%   97%  Weight:      Height:        Wt Readings from Last 3 Encounters:  09/08/20 73.9 kg  08/04/20 80.2 kg  07/26/20 77.1 kg    No intake or output data in the 24 hours ending 09/09/20 1036   Physical Exam Gen Exam:Alert awake-not in any distress HEENT:atraumatic, normocephalic Chest: B/L clear to auscultation anteriorly CVS:S1S2 regular Abdomen:soft non tender, non distended Extremities:no edema Neurology: Lower extremity weakness-just about able to lift both legs against gravity and mild resistance.  Sensation grossly intact. Skin: no rash   Data Review:    CBC Recent Labs  Lab 09/08/20 1823 09/09/20 0409  WBC 13.2* 12.2*  HGB 13.0 12.3*  HCT 39.9 37.8*  PLT 177 168  MCV 86.9 87.5  MCH 28.3 28.5  MCHC 32.6 32.5  RDW 14.3 14.4  LYMPHSABS 2.3  --   MONOABS 1.2*  --   EOSABS 0.1  --   BASOSABS 0.0  --     Chemistries  Recent Labs  Lab 09/06/20 0720 09/08/20 1823 09/09/20 0409  NA 135 137 136  K 4.2 3.5 2.8*  CL 95* 102 103  CO2 18* 19* 17*  GLUCOSE  274* 134* 89  BUN 41* 55* 51*  CREATININE 2.74* 2.80* 2.56*  CALCIUM 9.4 9.2 9.0  AST  --  208* 182*  ALT  --  173* 154*  ALKPHOS  --  58 46  BILITOT  --  1.6* 1.3*   ------------------------------------------------------------------------------------------------------------------ Recent Labs    09/09/20 0409  CHOL 117  HDL 29*  LDLCALC 46  TRIG 211*  CHOLHDL 4.0    Lab Results  Component Value Date   HGBA1C 7.7 (H) 07/06/2020   ------------------------------------------------------------------------------------------------------------------ No results for input(s): TSH, T4TOTAL, T3FREE, THYROIDAB in the last 72 hours.  Invalid input(s): FREET3 ------------------------------------------------------------------------------------------------------------------ No results for input(s): VITAMINB12, FOLATE, FERRITIN, TIBC, IRON, RETICCTPCT in the last 72 hours.  Coagulation profile No results for input(s): INR, PROTIME in the last 168 hours.  No results for input(s): DDIMER in the last 72 hours.  Cardiac Enzymes No results for input(s): CKMB, TROPONINI, MYOGLOBIN in the last 168 hours.  Invalid input(s): CK ------------------------------------------------------------------------------------------------------------------ No results found for: BNP  Micro Results Recent Results (from the past 240 hour(s))  Resp Panel by RT-PCR (Flu A&B, Covid) Nasopharyngeal Swab     Status: Abnormal   Collection Time: 09/09/20 12:32  AM   Specimen: Nasopharyngeal Swab; Nasopharyngeal(NP) swabs in vial transport medium  Result Value Ref Range Status   SARS Coronavirus 2 by RT PCR POSITIVE (A) NEGATIVE Final    Comment: RESULT CALLED TO, READ BACK BY AND VERIFIED WITH: Elige Radon RN 09/09/20 0224 JDW (NOTE) SARS-CoV-2 target nucleic acids are DETECTED.  The SARS-CoV-2 RNA is generally detectable in upper respiratory specimens during the acute phase of infection. Positive results  are indicative of the presence of the identified virus, but do not rule out bacterial infection or co-infection with other pathogens not detected by the test. Clinical correlation with patient history and other diagnostic information is necessary to determine patient infection status. The expected result is Negative.  Fact Sheet for Patients: EntrepreneurPulse.com.au  Fact Sheet for Healthcare Providers: IncredibleEmployment.be  This test is not yet approved or cleared by the [place] FDA and  has been authorized for detection and/or diagnosis of SARS-CoV-2 by FDA under an Emergency Use Authorization (EUA).  This EUA will remain in effect (meaning this test can be [place] ed) for the duration of  the COVID-19 declaration under Section 564(b)(1) of the Act, 21 U.S.C. section 360bbb-3(b)(1), unless the authorization is terminated or revoked sooner.     Influenza A by PCR NEGATIVE NEGATIVE Final   Influenza B by PCR NEGATIVE NEGATIVE Final    Comment: (NOTE) The Xpert Xpress SARS-CoV-2/FLU/RSV plus assay is intended as an aid in the diagnosis of influenza from Nasopharyngeal swab specimens and should not be used as a sole basis for treatment. Nasal washings and aspirates are unacceptable for Xpert Xpress SARS-CoV-2/FLU/RSV testing.  Fact Sheet for Patients: EntrepreneurPulse.com.au  Fact Sheet for Healthcare Providers: IncredibleEmployment.be  This test is not yet approved or cleared by the [place] FDA and has been authorized for detection and/or diagnosis of SARS-CoV-2 by FDA under an Emergency Use Authorization (EUA). This EUA will remain in effect (meaning this test can be used) for the duration of the COVID-19 declaration under Section 564(b)(1) of the Act, 21 U.S.C. section 360bbb-3(b)(1), unless the authorization is terminated or revoked.  Performed at [facility], [place] [address],  [place], [place] [address]     Radiology Reports CT ABDOMEN PELVIS WO CONTRAST  Result Date: 09/09/2020 CLINICAL DATA:  Weight loss, and intended. EXAM: CT ABDOMEN AND PELVIS WITHOUT CONTRAST TECHNIQUE: Multidetector CT imaging of the abdomen and pelvis was performed following the standard protocol without IV contrast. COMPARISON:  None. FINDINGS: Lower chest: Coronary artery calcifications. Partially visualized cardiac defibrillator. Diffuse reticulations of the visualized lung bases as well as suggestion of emphysematous changes. Possible tiny hiatal hernia. Hepatobiliary: No focal liver abnormality. Several calcified gallstones within the gallbladder lumen. No gallbladder wall thickening or pericholecystic fluid. No biliary dilatation. Pancreas: No focal lesion. Normal pancreatic contour. No surrounding inflammatory changes. No main pancreatic ductal dilatation. Spleen: Normal in size without focal abnormality. Suggestion of a splenule (3:33). Adrenals/Urinary Tract: No adrenal nodule bilaterally. No nephrolithiasis, no hydronephrosis, and no contour-deforming renal mass. No ureterolithiasis or hydroureter. Urinary bladder wall emphysema. Stomach/Bowel: PO contrast reaches the mid small bowel. Stomach is within normal limits. No evidence of bowel wall thickening or dilatation. Appendix appears normal. Vascular/Lymphatic: No abdominal aorta or iliac aneurysm. Mild atherosclerotic plaque of the aorta and its branches. No abdominal, pelvic, or inguinal lymphadenopathy. Reproductive: Prostate is unremarkable. Other: No intraperitoneal free fluid. No intraperitoneal free gas. No organized fluid collection. Musculoskeletal: No abdominal wall hernia or abnormality No suspicious lytic or blastic osseous lesions.  No acute displaced fracture. Multilevel degenerative changes of the spine. IMPRESSION: 1. Emphysematous cystitis. 2. Please note limited evaluation of the abdomen and pelvis due to noncontrast study. 3. Other  imaging findings of potential clinical significance: Fibrosis and emphysematous changes of the lungs. Aortic Atherosclerosis (ICD10-I70.0). Electronically Signed   By: Iven Finn M.D.   On: 09/09/2020 03:09   CT HEAD WO CONTRAST  Result Date: 09/09/2020 CLINICAL DATA:  Neurological deficit.  Acute stroke suspected. EXAM: CT HEAD WITHOUT CONTRAST TECHNIQUE: Contiguous axial images were obtained from the base of the skull through the vertex without intravenous contrast. COMPARISON:  06/01/2014 FINDINGS: Brain: Mild to moderate generalized brain volume loss. Numerous areas of low-density in the hemispheric white matter and basal ganglia consistent with chronic small vessel disease. No sign of acute infarction, mass lesion, hemorrhage, hydrocephalus or extra-axial collection. Vascular: There is atherosclerotic calcification of the major vessels at the base of the brain. Skull: Negative Sinuses/Orbits: Clear/normal Other: None IMPRESSION: No acute finding by CT. Atrophy and chronic small-vessel ischemic changes of the white matter and basal ganglia. Electronically Signed   By: Nelson Chimes M.D.   On: 09/09/2020 00:55   DG Chest Portable 1 View  Result Date: 09/08/2020 CLINICAL DATA:  Chest pain. EXAM: PORTABLE CHEST 1 VIEW COMPARISON:  September 12, 2010 FINDINGS: A multi lead AICD is noted. Multiple sternal wires are seen. The heart size and mediastinal contours are within normal limits. A radiopaque loop recorder device is seen. There is no evidence of acute infiltrate, pleural effusion or pneumothorax. Degenerative changes are seen throughout the thoracic spine. IMPRESSION: Stable exam without acute or active cardiopulmonary disease. Electronically Signed   By: Virgina Norfolk M.D.   On: 09/08/2020 18:50   ECHOCARDIOGRAM LIMITED  Result Date: 08/16/2020    ECHOCARDIOGRAM LIMITED REPORT   Patient Name:   RONAV FURNEY [facility]  Date of Exam: 08/16/2020 Medical Rec #:  [id number]     Height:       66.0 in  Accession #:    [id number]    Weight:       176.8 lb Date of Birth:  [birth year]    BSA:          1.898 m Patient Age:    29 years      BP:           111/67 mmHg Patient Gender: M             HR:           71 bpm. Exam Location:  [facility] Procedure: Limited Echo, Limited Color Doppler, Cardiac Doppler and Intracardiac            Opacification Agent Indications:    I50.9 CHF  History:        Patient has prior history of Echocardiogram examinations, most                 recent 07/06/2020. CHF, Previous Myocardial Infarction and CAD,                 Prior CABG, Stroke; Risk Factors:Hypertension, HLD, Diabetes and                 Dyslipidemia.  Sonographer:    Marygrace Drought RCS Referring Phys: [id number] Harvel A CHANDRASEKHAR IMPRESSIONS  1. Left ventricular ejection fraction, by estimation, is 50 to 55%. The left ventricle has low normal function. The left ventricle has no regional wall motion abnormalities.  2. Right ventricular systolic function is  normal. The right ventricular size is normal. There is normal pulmonary artery systolic pressure.  3. Left atrial size was mildly dilated.  4. The pericardial effusion is posterior and lateral to the left ventricle.  5. The mitral valve is normal in structure. No evidence of mitral valve regurgitation. No evidence of mitral stenosis.  6. The aortic valve is normal in structure. Aortic valve regurgitation is not visualized. No aortic stenosis is present.  7. The inferior vena cava is normal in size with greater than 50% respiratory variability, suggesting right atrial pressure of 3 mmHg. FINDINGS  Left Ventricle: Left ventricular ejection fraction, by estimation, is 50 to 55%. The left ventricle has low normal function. The left ventricle has no regional wall motion abnormalities. The left ventricular internal cavity size was normal in size. There is no left ventricular hypertrophy. Right Ventricle: The right ventricular size is normal. No increase in right ventricular  wall thickness. Right ventricular systolic function is normal. There is normal pulmonary artery systolic pressure. The tricuspid regurgitant velocity is 2.13 m/s, and  with an assumed right atrial pressure of 3 mmHg, the estimated right ventricular systolic pressure is 29.7 mmHg. Left Atrium: Left atrial size was mildly dilated. Right Atrium: Right atrial size was normal in size. Pericardium: Trivial pericardial effusion is present. The pericardial effusion is posterior and lateral to the left ventricle. Mitral Valve: The mitral valve is normal in structure. There is mild thickening of the mitral valve leaflet(s). There is mild calcification of the mitral valve leaflet(s). Mild mitral annular calcification. No evidence of mitral valve stenosis. Tricuspid Valve: The tricuspid valve is normal in structure. Tricuspid valve regurgitation is not demonstrated. No evidence of tricuspid stenosis. Aortic Valve: The aortic valve is normal in structure. Aortic valve regurgitation is not visualized. No aortic stenosis is present. Pulmonic Valve: The pulmonic valve was normal in structure. Pulmonic valve regurgitation is not visualized. No evidence of pulmonic stenosis. Aorta: The aortic root is normal in size and structure. Venous: The inferior vena cava is normal in size with greater than 50% respiratory variability, suggesting right atrial pressure of 3 mmHg. IAS/Shunts: No atrial level shunt detected by color flow Doppler. Additional Comments: A pacer wire is visualized. LEFT VENTRICLE PLAX 2D LVIDd:         4.10 cm LVIDs:         2.90 cm LV PW:         1.40 cm LV IVS:        0.80 cm LVOT diam:     2.20 cm LVOT Area:     3.80 cm  RIGHT VENTRICLE RVSP:           21.1 mmHg LEFT ATRIUM         Index      RIGHT ATRIUM LA diam:    3.90 cm 2.06 cm/m RA Pressure: 3.00 mmHg   AORTA Ao Root diam: 3.40 cm Ao Asc diam:  2.80 cm MITRAL VALVE                TRICUSPID VALVE MV Area (PHT):              TR Peak grad:   18.1 mmHg MV Decel  Time:              TR Vmax:        213.00 cm/s MV E velocity: 85.70 cm/s   Estimated RAP:  3.00 mmHg MV A velocity: 109.00 cm/s  RVSP:  21.1 mmHg MV E/A ratio:  0.79                             SHUNTS                             Systemic Diam: 2.20 cm Jenkins Rouge MD Electronically signed by Jenkins Rouge MD Signature Date/Time: 08/16/2020/9:55:39 AM    Final

## 2020-09-09 NOTE — Progress Notes (Signed)
Patient arrived on the unit at 15:30.  He was SOAKED in urine wearing his clothes from home.  Patient is not incontinent, but did not receive assistance when he needed to go in the ED.  Patient sating at 95% on room air, BP 99/66, MAP 76, HR 96 and respirations 20.  Cleaned patient can called in tele.  Patient now clean, resting comfortable with call bell in reach.

## 2020-09-09 NOTE — ED Notes (Signed)
Pt provided w/breakfast tray.

## 2020-09-09 NOTE — Consult Note (Signed)
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  Reports that a week ago he had some numbness in his right leg that progressed to weakness.  For the last few days he has been able to walk around with the help of a cart that he wheels around at work.  Today the weakness became more profound and he was unable to even stand up.  He has also had intermittent chest pain and pressure over the last week.  He used nitroglycerin when he had chest pain and chest pain would resolve.  States he last had chest pain approximately 24 hours ago.  He reports he is also had unintended weight loss over the last few months of approximately 60 pounds.  He also reports that he has been having difficulty with urination with a weak stream and occasional intermittent dysuria.  He has been referred to gastroenterology for further work-up of his unintended weight loss.  Dates he has not had any fever or chills.  He denies any cough.  He has had no known exposure to Covid. Lives with his fiance.  He denies tobacco, alcohol use  To CCM  - has incidental covid on admit. Not on O2. Says last 24-48h unable to stand but can move his legs. He built a ramp to walk in his house x fe wmonths. Increased dyspnea x few weeks. 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   Appears to have NSTEMI with high CK and trop. 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   reports that he has never smoked. He has never used smokeless tobacco.  Past Surgical History:  Procedure Laterality Date  . ABDOMINAL AORTAGRAM N/A 12/14/2013   Procedure: ABDOMINAL Maxcine Ham;  Surgeon: [name], MD;  Location[facility] CATH LAB;  Service: Cardiovascular;  Laterality: N/A;  . CARDIAC CATHETERIZATION  09/16/10  . COLONOSCOPY  01/12/2014  . CORONARY ARTERY BYPASS GRAFT  2006  . CORONARY STENT INTERVENTION  07/06/2020  . CORONARY STENT INTERVENTION N/A 07/06/2020   Procedure: CORONARY STENT INTERVENTION;  Surgeon: Nelva Bush, MD;  Location: [place] CV LAB;  Service: Cardiovascular;  Laterality: N/A;  . IMPLANTABLE CARDIOVERTER DEFIBRILLATOR IMPLANT  2012   STJ ICD- single chamber  . LEFT HEART CATH AND CORS/GRAFTS ANGIOGRAPHY N/A 02/19/2018   Procedure: LEFT HEART CATH AND CORS/GRAFTS ANGIOGRAPHY;  Surgeon: [name], MD;  Location: [place] CV LAB;  Service: Cardiovascular;  Laterality: N/A;  .  LEFT HEART CATH AND CORS/GRAFTS ANGIOGRAPHY N/A 07/06/2020   Procedure: LEFT HEART CATH AND CORS/GRAFTS ANGIOGRAPHY;  Surgeon: Nelva Bush, MD;  Location: [place] CV LAB;  Service: Cardiovascular;  Laterality: N/A;  . LOOP RECORDER IMPLANT N/A 08/24/2014   Procedure: LOOP RECORDER IMPLANT;  Surgeon: [name], MD;  Location: [facility] CATH LAB;   Service: Cardiovascular;  Laterality: N/A;  . MASS EXCISION  2008   right shoulder  . STERNECTOMY      No Known Allergies  Immunization History  Administered Date(s) Administered  . Influenza, Seasonal, Injecte, Preservative Fre 07/21/2013  . Influenza,inj,Quad PF,6+ Mos 04/26/2015, 05/14/2016, 05/27/2017, 04/13/2018  . Pneumococcal Polysaccharide-23 07/21/2013  . Zoster Recombinat (Shingrix) 05/18/2019    Family History  Problem Relation Age of Onset  . Coronary artery disease Father        early 67's  . Diabetes Father   . Hypertension Father   . Breast cancer Mother 4       mets to colon and other  . Glaucoma Mother   . Prostate cancer Brother   . Heart disease Brother   . Diabetes Sister      Current Facility-Administered Medications:  .  acetaminophen (TYLENOL) tablet 650 mg, 650 mg, Oral, Q4H PRN, Chotiner, Yevonne Aline, MD .  aspirin EC tablet 81 mg, 81 mg, Oral, Daily, Chotiner, Yevonne Aline, MD, 81 mg at 09/09/20 1012 .  cefTRIAXone (ROCEPHIN) 2 g in sodium chloride 0.9 % 100 mL IVPB, 2 g, Intravenous, Q24H, Ghimire, Henreitta Leber, MD, Stopped at 09/09/20 1320 .  clopidogrel (PLAVIX) tablet 75 mg, 75 mg, Oral, Daily, Chotiner, Yevonne Aline, MD, 75 mg at 09/09/20 1012 .  heparin ADULT infusion 100 units/mL (25000 units/216m), 1,250 Units/hr, Intravenous, Continuous, Ghimire, SHenreitta Leber MD, Last Rate: 12.5 mL/hr at 09/09/20 1247, 1,250 Units/hr at 09/09/20 1247 .  insulin aspart (novoLOG) injection 0-5 Units, 0-5 Units, Subcutaneous, QHS, Chotiner, BYevonne Aline MD .  insulin aspart (novoLOG) injection 0-9 Units, 0-9 Units, Subcutaneous, TID WC, Chotiner, BYevonne Aline MD .  insulin glargine (LANTUS) injection 70 Units, 70 Units, Subcutaneous, Daily, Chotiner, BYevonne Aline MD, 70 Units at 09/09/20 1013 .  lactated ringers infusion, , Intravenous, Continuous, Chotiner, BYevonne Aline MD, Last Rate: 100 mL/hr at 09/09/20 0221, New Bag at 09/09/20 0221 .  metoprolol succinate (TOPROL-XL) 24 hr  tablet 50 mg, 50 mg, Oral, Daily, Chotiner, BYevonne Aline MD, 50 mg at 09/09/20 1029 .  nitroGLYCERIN (NITROSTAT) SL tablet 0.4 mg, 0.4 mg, Sublingual, Q5 min PRN, Chotiner, BYevonne Aline MD .  pantoprazole (PROTONIX) EC tablet 40 mg, 40 mg, Oral, Q1200, Ghimire, SHenreitta Leber MD, 40 mg at 09/09/20 [facility]  09/08/2020 - admit   Consults:  *211 - cards   Procedures:  x  Significant Diagnostic Tests:  x  Micro Data:  2/12 - covid positive  Antimicrobials:  x   Interim History / Subjective:   09/09/2020 - seen in 5w  Objective   Blood pressure 99/66, pulse 96, temperature 98.3 F (36.8 C), temperature source Oral, resp. rate 20, height 5' 6"  (1.676 m), weight 73.9 kg, SpO2 95 %.        Intake/Output Summary (Last 24 hours) at 09/09/2020 1755 Last data filed at 09/09/2020 1320 Gross per 24 hour  Intake 100 ml  Output --  Net 100 ml   Filed Weights   09/08/20 1808  Weight: 73.9 kg    Examination: General: obese HENT: no elevated jvp or nodes Lungs:  Unable to ausculatate posteriorly due to his inability to hoist himself.Anterior no crackles. No distress Cardiovascular: Normal heart sounds Abdomen: visceral obesity Extremities: intact Neuro: axox3. Able to flex knee but unable to hoist GU: not examined  STIGMATA of CONNECTIVE TISSUE DISEASE  - Distal digital fissuring (ie, "mechanic hands") - no - Distal digital tip ulceration - no -Inflammatory arthritis or polyarticular morning joint stiffness ?60 minutes - no - Palmar telangiectasia - no - Raynaud phenomenon - no - Unexplained digital edema - no - Unexplained fixed rash on the digital extensor surfaces (Gottron's sign) - no ... 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 Or else can do as outpatient  -recommend neuro consult for Lower extremitity weakness -      Best practice (evaluated daily)  Per triad  Goals of Care:  Per triad   ATTESTATION & SIGNATURE   Dr. MBrand Males M.D., FSan Miguel Corp Alta Vista Regional HospitalC.P Pulmonary and Critical Care Medicine Staff Physician CLaguna HillsPulmonary and Critical Care Pager: [phone] If no answer or between  15:00h - 7:00h: call [phone]  09/09/2020 5:55 PM    LABS    PULMONARY No results for input(s): PHART, PCO2ART, PO2ART, HCO3, TCO2, O2SAT in the last 168 hours.  Invalid input(s): PCO2, PO2  CBC Recent Labs  Lab 09/08/20 1823 09/09/20 0409  HGB 13.0 12.3*  HCT 39.9 37.8*  WBC 13.2* 12.2*  PLT 177 168    COAGULATION No results for input(s): INR in the last 168 hours.  CARDIAC  No results for input(s): TROPONINI in the last 168 hours. No results for input(s): PROBNP in the last 168 hours.   CHEMISTRY Recent Labs  Lab 09/06/20 0720 09/08/20 1823 09/09/20 0409  NA 135 137 136  K 4.2 3.5 2.8*  CL 95* 102 103  CO2 18* 19* 17*  GLUCOSE 274* 134* 89  BUN 41* 55* 51*  CREATININE 2.74* 2.80* 2.56*  CALCIUM 9.4 9.2 9.0   Estimated Creatinine Clearance: 26 mL/min (A) (by C-G formula based on SCr of 2.56 mg/dL (H)).   LIVER Recent Labs  Lab 09/08/20 1823 09/09/20 0409  AST 208* 182*  ALT 173* 154*  ALKPHOS 58 46  BILITOT 1.6* 1.3*   PROT 7.0 6.1*  ALBUMIN 3.1* 2.8*     INFECTIOUS No results for input(s): LATICACIDVEN, PROCALCITON in the last 168 hours.   ENDOCRINE CBG (last 3)  Recent Labs    09/09/20 0820 09/09/20 1252 09/09/20 1706  GLUCAP 76 91 139*         IMAGING x48h  - image(s) personally visualized  -   highlighted in  bold CT ABDOMEN PELVIS WO CONTRAST  Result Date: 09/09/2020 CLINICAL DATA:  Weight loss, and intended. EXAM: CT ABDOMEN AND PELVIS WITHOUT CONTRAST TECHNIQUE: Multidetector CT imaging of the abdomen and pelvis was performed following the standard protocol without IV contrast. COMPARISON:  None. FINDINGS: Lower chest: Coronary artery calcifications. Partially visualized cardiac defibrillator. Diffuse reticulations of the visualized lung bases as well as suggestion of emphysematous changes. Possible tiny hiatal hernia. Hepatobiliary: No focal liver abnormality. Several calcified gallstones within the gallbladder lumen. No gallbladder wall thickening or pericholecystic fluid. No biliary dilatation. Pancreas: No focal lesion. Normal pancreatic contour. No surrounding inflammatory changes. No main pancreatic ductal dilatation. Spleen: Normal in size without focal abnormality. Suggestion of a splenule (3:33). Adrenals/Urinary Tract: No adrenal nodule bilaterally. No nephrolithiasis, no hydronephrosis, and no contour-deforming renal mass. No ureterolithiasis or hydroureter. Urinary bladder wall emphysema. Stomach/Bowel: PO contrast reaches the mid small bowel. Stomach is within normal limits. No evidence of bowel wall thickening or dilatation. Appendix appears normal. Vascular/Lymphatic: No abdominal aorta or iliac aneurysm. Mild atherosclerotic plaque of the aorta and its branches. No abdominal, pelvic, or inguinal lymphadenopathy. Reproductive: Prostate is unremarkable. Other: No intraperitoneal free fluid. No intraperitoneal free gas. No organized fluid collection. Musculoskeletal: No abdominal  wall hernia or abnormality No suspicious lytic or blastic osseous lesions. No acute displaced fracture. Multilevel degenerative changes of the spine. IMPRESSION: 1. Emphysematous cystitis. 2. Please note limited evaluation of the abdomen and pelvis due to noncontrast study. 3. Other imaging findings of potential clinical significance: Fibrosis and emphysematous changes of the lungs. Aortic Atherosclerosis (ICD10-I70.0). Electronically Signed   By: Iven Finn M.D.   On: 09/09/2020 03:09   CT HEAD WO CONTRAST  Result Date: 09/09/2020 CLINICAL DATA:  Neurological deficit.  Acute stroke suspected. EXAM: CT HEAD WITHOUT CONTRAST TECHNIQUE: Contiguous axial images were obtained from the base of the skull through the vertex without intravenous contrast. COMPARISON:  06/01/2014 FINDINGS: Brain: Mild to moderate generalized brain volume loss. Numerous areas of low-density in the hemispheric white matter and basal ganglia consistent with chronic small vessel disease. No sign of acute infarction, mass lesion, hemorrhage, hydrocephalus or extra-axial collection. Vascular: There is atherosclerotic calcification of the major vessels at the base of the brain. Skull: Negative Sinuses/Orbits: Clear/normal Other: None IMPRESSION: No acute finding by CT. Atrophy and chronic small-vessel ischemic changes of the white matter and basal ganglia. Electronically Signed   By: Nelson Chimes M.D.   On: 09/09/2020 00:55   CT CHEST WO CONTRAST  Result Date: 09/09/2020 CLINICAL DATA:  COVID positive. Generalized weakness. Retrosternal chest pain. EXAM: CT CHEST WITHOUT CONTRAST TECHNIQUE: Multidetector CT imaging of the chest was performed following the standard protocol without IV contrast. COMPARISON:  Chest radiograph from one day prior. FINDINGS: Cardiovascular: Top-normal heart size. Single lead left subclavian ICD with lead tip in the right ventricular apex. No significant pericardial effusion/thickening. Three-vessel coronary  atherosclerosis status post CABG. Atherosclerotic nonaneurysmal thoracic aorta. Normal caliber pulmonary arteries. Mediastinum/Nodes: No discrete thyroid nodules. Unremarkable esophagus. No pathologically enlarged axillary, mediastinal or hilar lymph nodes, noting limited sensitivity for the detection of hilar adenopathy on this noncontrast study. Lungs/Pleura: No pneumothorax. No pleural effusion. Mild centrilobular emphysema. Solid 5 mm right middle lobe pulmonary nodule along the minor fissure (series 5/image 75). No acute consolidative airspace disease, lung masses or additional significant pulmonary nodules. Extensive patchy confluent peripheral reticulation and ground-glass opacity throughout both lungs with associated moderate traction bronchiectasis, architectural distortion and volume loss. There is a basilar predominance to  these findings. No frank honeycombing. Upper abdomen: Cholelithiasis. Musculoskeletal: No aggressive appearing focal osseous lesions. Intact sternotomy wires. Marked thoracic spondylosis. IMPRESSION: 1. 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 Suggest outpatient pulmonology consultation and follow-up high-resolution chest CT study in 6-12 months. Findings are categorized as probable UIP per consensus guidelines: Diagnosis of Idiopathic Pulmonary Fibrosis: An Official ATS/ERS/JRS/ALAT Clinical Practice Guideline. [place], Iss 5, [phone], Mar 29 2017. 2. Solid 5 mm right middle lobe pulmonary nodule along the minor fissure. Follow-up noncontrast chest CT recommended in 12 months in this high risk patient. This recommendation follows the consensus statement: Guidelines for Management of Incidental Pulmonary Nodules Detected on CT Images: From the Fleischner Society 2017; Radiology 2017; 284:228-243. 3. Cholelithiasis. 4. Aortic Atherosclerosis (ICD10-I70.0) and Emphysema (ICD10-J43.9).  Electronically Signed   By: Ilona Sorrel M.D.   On: 09/09/2020 12:08   DG Chest Portable 1 View  Result Date: 09/08/2020 CLINICAL DATA:  Chest pain. EXAM: PORTABLE CHEST 1 VIEW COMPARISON:  September 12, 2010 FINDINGS: A multi lead AICD is noted. Multiple sternal wires are seen. The heart size and mediastinal contours are within normal limits. A radiopaque loop recorder device is seen. There is no evidence of acute infiltrate, pleural effusion or pneumothorax. Degenerative changes are seen throughout the thoracic spine. IMPRESSION: Stable exam without acute or active cardiopulmonary disease. Electronically Signed   By: Virgina Norfolk M.D.   On: 09/08/2020 18:50   VAS [place] LOWER EXTREMITY VENOUS (DVT) (ONLY [facility] & [facility])  Result Date: 09/09/2020  Lower Venous DVT Study Indications: Covid-19.  Limitations: Shadowing from arterial plaque, clothing. Comparison Study: No prior study Performing Technologist: Sharion Dove RVS  Examination Guidelines: A complete evaluation includes B-mode imaging, spectral Doppler, color Doppler, and power Doppler as needed of all accessible portions of each vessel. Bilateral testing is considered an integral part of a complete examination. Limited examinations for reoccurring indications may be performed as noted. The reflux portion of the exam is performed with the patient in reverse Trendelenburg.  +---------+---------------+---------+-----------+----------+-------------------+ RIGHT    CompressibilityPhasicitySpontaneityPropertiesThrombus Aging      +---------+---------------+---------+-----------+----------+-------------------+ CFV                                                   Not well visualized +---------+---------------+---------+-----------+----------+-------------------+ SFJ                                                   Not well visualized +---------+---------------+---------+-----------+----------+-------------------+ FV Prox  Full            Yes      Yes                                      +---------+---------------+---------+-----------+----------+-------------------+ FV Mid   Full                                                             +---------+---------------+---------+-----------+----------+-------------------+  FV DistalFull                                                             +---------+---------------+---------+-----------+----------+-------------------+ PFV                                                   Not well visualized +---------+---------------+---------+-----------+----------+-------------------+ POP      Partial        Yes      Yes                  Acute               +---------+---------------+---------+-----------+----------+-------------------+ PTV      Full                                                             +---------+---------------+---------+-----------+----------+-------------------+ PERO     None                                         Acute               +---------+---------------+---------+-----------+----------+-------------------+ Gastroc  None                                         Acute               +---------+---------------+---------+-----------+----------+-------------------+   +---------+---------------+---------+-----------+----------+-------------------+ LEFT     CompressibilityPhasicitySpontaneityPropertiesThrombus Aging      +---------+---------------+---------+-----------+----------+-------------------+ CFV                                                   Not well visualized +---------+---------------+---------+-----------+----------+-------------------+ SFJ                                                   Not well visualized +---------+---------------+---------+-----------+----------+-------------------+ FV Prox  Full           Yes      Yes                                       +---------+---------------+---------+-----------+----------+-------------------+ FV Mid   Full                                                             +---------+---------------+---------+-----------+----------+-------------------+  FV DistalFull           Yes      Yes                                      +---------+---------------+---------+-----------+----------+-------------------+ PFV                                                   Not well visualized +---------+---------------+---------+-----------+----------+-------------------+ POP      Full           Yes      Yes                                      +---------+---------------+---------+-----------+----------+-------------------+ PTV      Full                                                             +---------+---------------+---------+-----------+----------+-------------------+ PERO     Full                                                             +---------+---------------+---------+-----------+----------+-------------------+    Summary: RIGHT: - Findings consistent with acute deep vein thrombosis involving the right popliteal vein, right peroneal veins, and right gastrocnemius veins. - There is no evidence of deep vein thrombosis in the lower extremity. - There is no evidence of superficial venous thrombosis.  LEFT: - No evidence of common femoral vein obstruction.  *See table(s) above for measurements and observations.    Preliminary

## 2020-09-10 ENCOUNTER — Encounter (HOSPITAL_COMMUNITY): Payer: Self-pay

## 2020-09-10 DIAGNOSIS — J849 Interstitial pulmonary disease, unspecified: Secondary | ICD-10-CM | POA: Diagnosis not present

## 2020-09-10 DIAGNOSIS — I214 Non-ST elevation (NSTEMI) myocardial infarction: Secondary | ICD-10-CM | POA: Diagnosis not present

## 2020-09-10 LAB — ECHOCARDIOGRAM LIMITED
Area-P 1/2: 4.63 cm2
Height: 66 in
S' Lateral: 3.7 cm
Single Plane A2C EF: 40.3 %
Weight: 2608 oz

## 2020-09-10 LAB — BASIC METABOLIC PANEL
Anion gap: 11 (ref 5–15)
BUN: 42 mg/dL — ABNORMAL HIGH (ref 8–23)
CO2: 23 mmol/L (ref 22–32)
Calcium: 8.9 mg/dL (ref 8.9–10.3)
Chloride: 106 mmol/L (ref 98–111)
Creatinine, Ser: 2.15 mg/dL — ABNORMAL HIGH (ref 0.61–1.24)
GFR, Estimated: 33 mL/min — ABNORMAL LOW (ref >60)
Glucose, Bld: 60 mg/dL — ABNORMAL LOW (ref 70–99)
Potassium: 3.3 mmol/L — ABNORMAL LOW (ref 3.5–5.1)
Sodium: 140 mmol/L (ref 135–145)

## 2020-09-10 LAB — GLUCOSE, CAPILLARY
Glucose-Capillary: 50 mg/dL — ABNORMAL LOW (ref 70–99)
Glucose-Capillary: 88 mg/dL (ref 70–99)
Glucose-Capillary: 114 mg/dL — ABNORMAL HIGH (ref 70–99)
Glucose-Capillary: 161 mg/dL — ABNORMAL HIGH (ref 70–99)
Glucose-Capillary: 268 mg/dL — ABNORMAL HIGH (ref 70–99)

## 2020-09-10 LAB — TROPONIN I (HIGH SENSITIVITY): Troponin I (High Sensitivity): 2237 ng/L (ref <18)

## 2020-09-10 LAB — CBC
HCT: 33.4 % — ABNORMAL LOW (ref 39.0–52.0)
Hemoglobin: 11.5 g/dL — ABNORMAL LOW (ref 13.0–17.0)
MCH: 29.2 pg (ref 26.0–34.0)
MCHC: 34.4 g/dL (ref 30.0–36.0)
MCV: 84.8 fL (ref 80.0–100.0)
Platelets: 174 10*3/uL (ref 150–400)
RBC: 3.94 MIL/uL — ABNORMAL LOW (ref 4.22–5.81)
RDW: 14.6 % (ref 11.5–15.5)
WBC: 12.1 10*3/uL — ABNORMAL HIGH (ref 4.0–10.5)
nRBC: 0 % (ref 0.0–0.2)

## 2020-09-10 LAB — D-DIMER, QUANTITATIVE: D-Dimer, Quant: 0.5 ug/mL-FEU (ref 0.00–0.50)

## 2020-09-10 LAB — BRAIN NATRIURETIC PEPTIDE: B Natriuretic Peptide: 457.4 pg/mL — ABNORMAL HIGH (ref 0.0–100.0)

## 2020-09-10 LAB — HEPARIN LEVEL (UNFRACTIONATED): Heparin Unfractionated: 0.39 IU/mL (ref 0.30–0.70)

## 2020-09-10 LAB — MAGNESIUM: Magnesium: 2.6 mg/dL — ABNORMAL HIGH (ref 1.7–2.4)

## 2020-09-10 LAB — C-REACTIVE PROTEIN: CRP: 2.2 mg/dL — ABNORMAL HIGH (ref <1.0)

## 2020-09-10 LAB — CK
Total CK: 5937 U/L — ABNORMAL HIGH (ref 49–397)
Total CK: 4960 U/L — ABNORMAL HIGH (ref 49–397)

## 2020-09-10 MED ORDER — LACTATED RINGERS IV SOLN: INTRAVENOUS | Status: AC

## 2020-09-10 MED ORDER — POTASSIUM CHLORIDE CRYS ER 20 MEQ PO TBCR
40.0000 meq | EXTENDED_RELEASE_TABLET | Freq: Two times a day (BID) | ORAL | Status: AC
  Administered 2020-09-10 (×2): 40 meq via ORAL
  Filled 2020-09-10 (×2): qty 2

## 2020-09-10 MED ORDER — INSULIN GLARGINE 100 UNIT/ML ~~LOC~~ SOLN
50.0000 [IU] | Freq: Every day | SUBCUTANEOUS | Status: DC
  Administered 2020-09-11 – 2020-09-12 (×2): 50 [IU] via SUBCUTANEOUS
  Filled 2020-09-10 (×2): qty 0.5

## 2020-09-10 NOTE — Progress Notes (Signed)
Patients Blood glucose 50 this morning.  Gave OJ and pt. Is currently eating breakfast.  Will recheck

## 2020-09-10 NOTE — Consult Note (Signed)
[facility] Surgery Consult Note   Eugene [name] [facility] 04/10/1956  [id number].    Requesting MD: Ronnie Derby Chief Complaint: Weakness of legs unable to ambulate, chest pain Reason for Consult: Muscle biopsy HPI:  Patient is a 65 year old patient with mental: History of severe CAD, CABG times 10/2004, type 2 diabetes, stage IV CKD, Hx V. fib arrest/ICD, who presents with 1 week of progressive lower extremity weakness and centralized substernal chest pain.  He reported numbness on the right leg that progressed to weakness.  The weakness became more profound he was unable to stand and presented to the ED on 09/09/2020.  Weakness was primarily in the right lower extremity.   He reports an unintended weight loss of approximately 60 pounds, some BPH-like symptoms.  Work-up in the ED he was found to have elevated troponins of greater than 10,000 repeat was 13,000.,  CPK levels greater than 8000.  EKG showed mild ST depression in inferior leads.  Mated with an NSTEMI, and rhabdomyolysis.  He was seen by cardiology and it was their opinion that he had very elevated troponins without any signs of acute coronary syndrome.  He is being seen by CCM, neurology consult is pending, cardiology, and we are asked to see for possible muscle biopsy.  ROS: ROS  Family History  Problem Relation Age of Onset  . Coronary artery disease Father        early 8's  . Diabetes Father   . Hypertension Father   . Breast cancer Mother 54       mets to colon and other  . Glaucoma Mother   . Prostate cancer Brother   . Heart disease Brother   . Diabetes Sister     Past Medical History:  Diagnosis Date  . Abnormal nuclear stress test 02/19/2018  . Angina, class III ([place]) 02/19/2018  . Anxiety   . Asthma   . CHF (congestive heart failure) ([place])   . Coronary artery disease   . DDD (degenerative disc disease), cervical 03/01/2013  . Diabetes mellitus    type 2  . 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 Dyslipidemia    . Hypertension   . Lumbosacral radiculopathy at L4 02/18/2017  . Myocardial infarction ([place]) 2012  . Obesity   . Sleep apnea 08/17/2013  . Snoring 10/12/2013  . TIA (transient ischemic attack) 08/09/2014  . Tubular adenoma of colon 01/12/2014   transverse colon  . VF (ventricular fibrillation) ([place])    arrest  . Vitamin D deficiency 02/18/2017    Past Surgical History:  Procedure Laterality Date  . ABDOMINAL AORTAGRAM N/A 12/14/2013   Procedure: ABDOMINAL Maxcine Ham;  Surgeon: [name], MD;  Location: [facility] CATH LAB;  Service: Cardiovascular;  Laterality: N/A;  . CARDIAC CATHETERIZATION  09/16/10  . COLONOSCOPY  01/12/2014  . CORONARY ARTERY BYPASS GRAFT  2006  . CORONARY STENT INTERVENTION  07/06/2020  . CORONARY STENT INTERVENTION N/A 07/06/2020   Procedure: CORONARY STENT INTERVENTION;  Surgeon: Nelva Bush, MD;  Location: [facility] CV LAB;  Service: Cardiovascular;  Laterality: N/A;  . IMPLANTABLE CARDIOVERTER DEFIBRILLATOR IMPLANT  2012   STJ ICD- single chamber  . LEFT HEART CATH AND CORS/GRAFTS ANGIOGRAPHY N/A 02/19/2018   Procedure: LEFT HEART CATH AND CORS/GRAFTS ANGIOGRAPHY;  Surgeon: [name], MD;  Location: [facility] CV LAB;  Service: Cardiovascular;  Laterality: N/A;  . LEFT HEART CATH AND CORS/GRAFTS ANGIOGRAPHY N/A 07/06/2020   Procedure: LEFT HEART CATH AND CORS/GRAFTS ANGIOGRAPHY;  Surgeon: End,  Harrell Gave, MD;  Location: [facility] CV LAB;  Service: Cardiovascular;  Laterality: N/A;  . LOOP RECORDER IMPLANT N/A 08/24/2014   Procedure: LOOP RECORDER IMPLANT;  Surgeon: [name], MD;  Location: [facility] CATH LAB;  Service: Cardiovascular;  Laterality: N/A;  . MASS EXCISION  2008   right shoulder  . STERNECTOMY      Social History:  reports that he has never smoked. He has never used smokeless tobacco. He reports that he does not drink alcohol and does not use drugs.  Allergies: No Known Allergies  Medications Prior to Admission  Medication Sig  Dispense Refill  . aspirin 81 MG tablet Take 81 mg by mouth daily.     . clopidogrel (PLAVIX) 75 MG tablet Take 75 mg by mouth daily.    . dapagliflozin propanediol (FARXIGA) 10 MG TABS tablet Take 10 mg by mouth at bedtime.    . furosemide (LASIX) 40 MG tablet Take 1 tablet (40 mg total) by mouth daily. 90 tablet 3  . insulin glargine (LANTUS) 100 UNIT/ML Solostar Pen Inject 70 Units into the skin daily before breakfast.    . metFORMIN (GLUCOPHAGE) 500 MG tablet Take 1 tablet (500 mg total) by mouth 2 (two) times daily with a meal.    . methocarbamol (ROBAXIN) 500 MG tablet Take 500 mg by mouth 2 (two) times daily.    . metoprolol succinate (TOPROL-XL) 50 MG 24 hr tablet TAKE ONE TABLET BY MOUTH ONE TIME DAILY. TAKE WITH OR IMMEDIATELY FOLLOWING A MEAL (Patient taking differently: Take 50 mg by mouth daily.) 90 tablet 1  . nitroGLYCERIN (NITROSTAT) 0.4 MG SL tablet Place 0.4 mg under the tongue every 5 (five) minutes as needed for chest pain.    . pantoprazole (PROTONIX) 40 MG tablet Take 40 mg by mouth daily before breakfast.    . ranolazine (RANEXA) 500 MG 12 hr tablet TAKE ONE TABLET BY MOUTH TWICE DAILY.PLEASE MAKE APPT WITH DR.HARDING FOR REFILLS.  (Patient taking differently: Take 500 mg by mouth 2 (two) times daily.) 60 tablet 2  . rosuvastatin (CRESTOR) 40 MG tablet TAKE ONE TABLET BY MOUTH DAILY 90 tablet 3  . Semaglutide, 1 MG/DOSE, (OZEMPIC, 1 MG/DOSE,) 4 MG/3ML SOPN Inject 1 mg into the skin every Tuesday.      Blood pressure 108/67, pulse 66, temperature 98 F (36.7 C), temperature source Oral, resp. rate 18, height 5\' 6"  (1.676 m), weight 73.9 kg, SpO2 95 %. Physical Exam:  General: pleasant, WD, WN white male who is sitting up in the chair this morning.  He got up with the help of PT but notes weakness is improved.  HEENT: head is normocephalic, atraumatic.  Sclera are noninjected pupils were equal.  Ears and nose without any masses or lesions.  Mouth is pink and moist Heart:  regular, rate, and rhythm.  Normal s1,s2. No obvious murmurs, gallops, or rubs noted.  He has a large mass on his right chest adjacent to the proximal clavicle.  He says has been there for 50 years.  He had a similar mass over his chest above the sternotomy which was removed with his cardiac surgery.  This is healed but remains red with some ongoing crusting. Lungs: CTAB, no wheezes, rhonchi, or rales noted.  Respiratory effort nonlabored no symptoms from his Covid Abd: soft, NT, ND, +BS, no masses, hernias, or organomegaly MS: He was able to stand and moved to the chair with assist this morning. Skin: warm and dry with no masses, lesions, or  rashes Neuro: Cranial nerves 2-12 grossly intact, sensation is normal throughout Psych: A&Ox3 with an appropriate affect.   Results for orders placed or performed during the hospital encounter of 09/08/20 (from the past 48 hour(s))  Comprehensive metabolic panel     Status: Abnormal   Collection Time: 09/08/20  6:23 PM  Result Value Ref Range   Sodium 137 135 - 145 mmol/L   Potassium 3.5 3.5 - 5.1 mmol/L   Chloride 102 98 - 111 mmol/L   CO2 19 (L) 22 - 32 mmol/L   Glucose, Bld 134 (H) 70 - 99 mg/dL    Comment: Glucose reference range applies only to samples taken after fasting for at least 8 hours.   BUN 55 (H) 8 - 23 mg/dL   Creatinine, Ser 2.80 (H) 0.61 - 1.24 mg/dL   Calcium 9.2 8.9 - 10.3 mg/dL   Total Protein 7.0 6.5 - 8.1 g/dL   Albumin 3.1 (L) 3.5 - 5.0 g/dL   AST 208 (H) 15 - 41 U/L   ALT 173 (H) 0 - 44 U/L   Alkaline Phosphatase 58 38 - 126 U/L   Total Bilirubin 1.6 (H) 0.3 - 1.2 mg/dL   GFR, Estimated 24 (L) >60 mL/min    Comment: (NOTE) Calculated using the CKD-EPI Creatinine Equation (2021)    Anion gap 16 (H) 5 - 15    Comment: Performed at [facility], [place] [address] [place], [place] [address]  CBC with Differential     Status: Abnormal   Collection Time: 09/08/20  6:23 PM  Result Value Ref Range   WBC 13.2 (H) 4.0 -  10.5 K/uL   RBC 4.59 4.22 - 5.81 MIL/uL   Hemoglobin 13.0 13.0 - 17.0 g/dL   HCT 39.9 39.0 - 52.0 %   MCV 86.9 80.0 - 100.0 fL   MCH 28.3 26.0 - 34.0 pg   MCHC 32.6 30.0 - 36.0 g/dL   RDW 14.3 11.5 - 15.5 %   Platelets 177 150 - 400 K/uL   nRBC 0.0 0.0 - 0.2 %   Neutrophils Relative % 72 %   Neutro Abs 9.4 (H) 1.7 - 7.7 K/uL   Lymphocytes Relative 18 %   Lymphs Abs 2.3 0.7 - 4.0 K/uL   Monocytes Relative 9 %   Monocytes Absolute 1.2 (H) 0.1 - 1.0 K/uL   Eosinophils Relative 1 %   Eosinophils Absolute 0.1 0.0 - 0.5 K/uL   Basophils Relative 0 %   Basophils Absolute 0.0 0.0 - 0.1 K/uL   Immature Granulocytes 0 %   Abs Immature Granulocytes 0.04 0.00 - 0.07 K/uL    Comment: Performed at [facility] [address] [place], [place] [address]  Troponin I (High Sensitivity)     Status: Abnormal   Collection Time: 09/08/20  6:23 PM  Result Value Ref Range   Troponin I (High Sensitivity) 10,598 (HH) <18 ng/L    Comment: CRITICAL RESULT CALLED TO, READ BACK BY AND VERIFIED WITH: Elige Radon RN [id number] K FORSYTH (NOTE) Elevated high sensitivity troponin I (hsTnI) values and significant  changes across serial measurements may suggest ACS but many other  chronic and acute conditions are known to elevate hsTnI results.  Refer to the Links section for chest pain algorithms and additional  guidance. Performed at [facility], [place] [address] [place], [place] [address]   Troponin I (High Sensitivity)     Status: Abnormal   Collection Time: 09/08/20  9:21 PM  Result Value Ref Range   Troponin I (High Sensitivity) 13,971 (HH) <18 ng/L    Comment: CRITICAL VALUE NOTED.  VALUE IS CONSISTENT WITH PREVIOUSLY REPORTED AND CALLED VALUE. (NOTE) Elevated high sensitivity troponin I (hsTnI) values and significant  changes across serial measurements may suggest ACS but many other  chronic and acute conditions are known to elevate hsTnI results.  Refer to the Links section for  chest pain algorithms and additional  guidance. Performed at [facility], [place] [address], [place], [place] [address]   CK     Status: Abnormal   Collection Time: 09/08/20  9:21 PM  Result Value Ref Range   Total CK 8,423 (H) 49 - 397 U/L    Comment: RESULTS CONFIRMED BY MANUAL DILUTION Performed at [facility], [place] [address] [place], [place] [address]   Urinalysis, Routine w reflex microscopic Nasopharyngeal Swab     Status: Abnormal   Collection Time: 09/09/20 12:32 AM  Result Value Ref Range   Color, Urine YELLOW YELLOW   APPearance CLOUDY (A) CLEAR   Specific Gravity, Urine 1.013 1.005 - 1.030   pH 6.0 5.0 - 8.0   Glucose, UA 150 (A) NEGATIVE mg/dL   Hgb urine dipstick LARGE (A) NEGATIVE   Bilirubin Urine NEGATIVE NEGATIVE   Ketones, ur NEGATIVE NEGATIVE mg/dL   Protein, ur 100 (A) NEGATIVE mg/dL   Nitrite NEGATIVE NEGATIVE   Leukocytes,Ua MODERATE (A) NEGATIVE   RBC / HPF >50 (H) 0 - 5 RBC/hpf   WBC, UA >50 (H) 0 - 5 WBC/hpf   Bacteria, UA MANY (A) NONE SEEN   Mucus PRESENT     Comment: Performed at [facility] [address], [place], [place] [address]  Resp Panel by RT-PCR (Flu A&B, Covid) Nasopharyngeal Swab     Status: Abnormal   Collection Time: 09/09/20 12:32 AM   Specimen: Nasopharyngeal Swab; Nasopharyngeal(NP) swabs in vial transport medium  Result Value Ref Range   SARS Coronavirus 2 by RT PCR POSITIVE (A) NEGATIVE    Comment: RESULT CALLED TO, READ BACK BY AND VERIFIED WITH: Elige Radon RN 09/09/20 0224 JDW (NOTE) SARS-CoV-2 target nucleic acids are DETECTED.  The SARS-CoV-2 RNA is generally detectable in upper respiratory specimens during the acute phase of infection. Positive results are indicative of the presence of the identified virus, but do not rule out bacterial infection or co-infection with other pathogens not detected by the test. Clinical correlation with patient history and other diagnostic information is necessary to  determine patient infection status. The expected result is Negative.  Fact Sheet for Patients: EntrepreneurPulse.com.au  Fact Sheet for Healthcare Providers: IncredibleEmployment.be  This test is not yet approved or cleared by the [place] FDA and  has been authorized for detection and/or diagnosis of SARS-CoV-2 by FDA under an Emergency Use Authorization (EUA).  This EUA will remain in effect (meaning this test can be [place] ed) for the duration of  the COVID-19 declaration under Section 564(b)(1) of the Act, 21 U.S.C. section 360bbb-3(b)(1), unless the authorization is terminated or revoked sooner.     Influenza A by PCR NEGATIVE NEGATIVE   Influenza B by PCR NEGATIVE NEGATIVE    Comment: (NOTE) The Xpert Xpress SARS-CoV-2/FLU/RSV plus assay is intended as an aid in the diagnosis of influenza from Nasopharyngeal swab specimens and should not be used as a sole basis for treatment. Nasal washings and aspirates are unacceptable for Xpert Xpress SARS-CoV-2/FLU/RSV testing.  Fact Sheet for Patients: EntrepreneurPulse.com.au  Fact Sheet for Healthcare Providers: IncredibleEmployment.be  This test is not yet approved or cleared by the [place] FDA and has been authorized for detection and/or diagnosis of SARS-CoV-2 by FDA under an Emergency Use Authorization (EUA). This EUA will remain in effect (meaning this test can be used) for the duration of the COVID-19 declaration under Section 564(b)(1) of the Act, 21 U.S.C. section 360bbb-3(b)(1), unless the authorization is terminated or revoked.  Performed at [facility], [place] [address], [place], [place] [address]   Heparin level (unfractionated)     Status: Abnormal   Collection Time: 09/09/20  4:09 AM  Result Value Ref Range   Heparin Unfractionated 0.15 (L) 0.30 - 0.70 IU/mL    Comment: (NOTE) If heparin results are below expected values, and  patient dosage has  been confirmed, suggest follow up testing of antithrombin III levels. Performed at [facility], [place] [address] [place], [place] [address]   CBC     Status: Abnormal   Collection Time: 09/09/20  4:09 AM  Result Value Ref Range   WBC 12.2 (H) 4.0 - 10.5 K/uL   RBC 4.32 4.22 - 5.81 MIL/uL   Hemoglobin 12.3 (L) 13.0 - 17.0 g/dL   HCT 37.8 (L) 39.0 - 52.0 %   MCV 87.5 80.0 - 100.0 fL   MCH 28.5 26.0 - 34.0 pg   MCHC 32.5 30.0 - 36.0 g/dL   RDW 14.4 11.5 - 15.5 %   Platelets 168 150 - 400 K/uL   nRBC 0.0 0.0 - 0.2 %    Comment: Performed at [facility], [place] [address], [place], [place] [address]  PSA     Status: None   Collection Time: 09/09/20  4:09 AM  Result Value Ref Range   Prostatic Specific Antigen 0.97 0.00 - 4.00 ng/mL    Comment: (NOTE) While PSA levels of <=4.0 ng/ml are reported as reference range, some men with levels below 4.0 ng/ml can have prostate cancer and many men with PSA above 4.0 ng/ml do not have prostate cancer.  Other tests such as free PSA, age specific reference ranges, PSA velocity and PSA doubling time may be helpful especially in men less than 32 years old. Performed at [facility], [place] [address], [place], [place] [address]   HIV Antibody (routine testing w rflx)     Status: None   Collection Time: 09/09/20  4:09 AM  Result Value Ref Range   HIV Screen 4th Generation wRfx Non Reactive Non Reactive    Comment: Performed at [facility], [place] [address] [place], [place] [address]  Troponin I (High Sensitivity)     Status: Abnormal   Collection Time: 09/09/20  4:09 AM  Result Value Ref Range   Troponin I (High Sensitivity) 6,373 (HH) <18 ng/L    Comment: CRITICAL VALUE NOTED.  VALUE IS CONSISTENT WITH PREVIOUSLY REPORTED AND CALLED VALUE. (NOTE) Elevated high sensitivity troponin I (hsTnI) values and significant  changes across serial measurements may suggest ACS but many other  chronic and acute  conditions are known to elevate hsTnI results.  Refer to the Links section for chest pain algorithms and additional  guidance. Performed at [facility], [place] [address] [place], [place] [address]   Lipid panel     Status: Abnormal   Collection Time: 09/09/20  4:09 AM  Result Value Ref Range   Cholesterol 117 0 - 200 mg/dL   Triglycerides 211 (H) <150  mg/dL   HDL 29 (L) >40 mg/dL   Total CHOL/HDL Ratio 4.0 RATIO   VLDL 42 (H) 0 - 40 mg/dL   LDL Cholesterol 46 0 - 99 mg/dL    Comment:        Total Cholesterol/HDL:CHD Risk Coronary Heart Disease Risk Table                     Men   Women  1/2 Average Risk   3.4   3.3  Average Risk       5.0   4.4  2 X Average Risk   9.6   7.1  3 X Average Risk  23.4   11.0        Use the calculated Patient Ratio above and the CHD Risk Table to determine the patient's CHD Risk.        ATP III CLASSIFICATION (LDL):  <100     mg/dL   Optimal  100-129  mg/dL   Near or Above                    Optimal  130-159  mg/dL   Borderline  160-189  mg/dL   High  >190     mg/dL   Very High Performed at [facility] [address], [place], [place] [address]   Comprehensive metabolic panel     Status: Abnormal   Collection Time: 09/09/20  4:09 AM  Result Value Ref Range   Sodium 136 135 - 145 mmol/L   Potassium 2.8 (L) 3.5 - 5.1 mmol/L   Chloride 103 98 - 111 mmol/L   CO2 17 (L) 22 - 32 mmol/L   Glucose, Bld 89 70 - 99 mg/dL    Comment: Glucose reference range applies only to samples taken after fasting for at least 8 hours.   BUN 51 (H) 8 - 23 mg/dL   Creatinine, Ser 2.56 (H) 0.61 - 1.24 mg/dL   Calcium 9.0 8.9 - 10.3 mg/dL   Total Protein 6.1 (L) 6.5 - 8.1 g/dL   Albumin 2.8 (L) 3.5 - 5.0 g/dL   AST 182 (H) 15 - 41 U/L   ALT 154 (H) 0 - 44 U/L   Alkaline Phosphatase 46 38 - 126 U/L   Total Bilirubin 1.3 (H) 0.3 - 1.2 mg/dL   GFR, Estimated 27 (L) >60 mL/min    Comment: (NOTE) Calculated using the CKD-EPI Creatinine Equation  (2021)    Anion gap 16 (H) 5 - 15    Comment: Performed at [facility], [place] [address], [place], [place] [address]  CK     Status: Abnormal   Collection Time: 09/09/20  4:09 AM  Result Value Ref Range   Total CK 6,921 (H) 49 - 397 U/L    Comment: RESULTS CONFIRMED BY MANUAL DILUTION Performed at [facility], [place] [address] [place], [place] [address]   CBG monitoring, ED     Status: None   Collection Time: 09/09/20  8:20 AM  Result Value Ref Range   Glucose-Capillary 76 70 - 99 mg/dL    Comment: Glucose reference range applies only to samples taken after fasting for at least 8 hours.   Comment 1 Notify RN    Comment 2 Document in Chart   Urine Culture     Status: Abnormal (Preliminary result)   Collection Time: 09/09/20 10:32 AM   Specimen: Urine, Random  Result Value Ref Range   Specimen Description  URINE, RANDOM    Special Requests      Normal Performed at [facility], [place] [address], [place], [place] [address]    Culture >=100,000 COLONIES/mL GRAM NEGATIVE RODS (A)    Report Status PENDING   Heparin level (unfractionated)     Status: None   Collection Time: 09/09/20 12:06 PM  Result Value Ref Range   Heparin Unfractionated 0.31 0.30 - 0.70 IU/mL    Comment: (NOTE) If heparin results are below expected values, and patient dosage has  been confirmed, suggest follow up testing of antithrombin III levels. Performed at [facility], [place] [address] [place], [place] [address]   TSH     Status: None   Collection Time: 09/09/20 12:06 PM  Result Value Ref Range   TSH 2.186 0.350 - 4.500 uIU/mL    Comment: Performed by a 3rd Generation assay with a functional sensitivity of <=0.01 uIU/mL. Performed at [facility], [place] [address], [place], [place] [address]   Vitamin B12     Status: Abnormal   Collection Time: 09/09/20 12:06 PM  Result Value Ref Range   Vitamin B-12 91 (L) 180 - 914 pg/mL    Comment: (NOTE) This assay is not validated for  testing neonatal or myeloproliferative syndrome specimens for Vitamin B12 levels. Performed at [facility], [place] [address] [place], [place] [address]   Sedimentation rate     Status: Abnormal   Collection Time: 09/09/20 12:06 PM  Result Value Ref Range   Sed Rate 83 (H) 0 - 16 mm/hr    Comment: Performed at [facility] [address] [place], [place] [address]  CBG monitoring, ED     Status: None   Collection Time: 09/09/20 12:52 PM  Result Value Ref Range   Glucose-Capillary 91 70 - 99 mg/dL    Comment: Glucose reference range applies only to samples taken after fasting for at least 8 hours.  Magnesium     Status: None   Collection Time: 09/09/20  4:43 PM  Result Value Ref Range   Magnesium 1.8 1.7 - 2.4 mg/dL    Comment: Performed at [facility], [place] [address] [place], [place] [address]  Procalcitonin - Baseline     Status: None   Collection Time: 09/09/20  4:43 PM  Result Value Ref Range   Procalcitonin 0.10 ng/mL    Comment:        Interpretation: PCT (Procalcitonin) <= 0.5 ng/mL: Systemic infection (sepsis) is not likely. Local bacterial infection is possible. (NOTE)       Sepsis PCT Algorithm           Lower Respiratory Tract                                      Infection PCT Algorithm    ----------------------------     ----------------------------         PCT < 0.25 ng/mL                PCT < 0.10 ng/mL          Strongly encourage             Strongly discourage   discontinuation of antibiotics    initiation of antibiotics    ----------------------------     -----------------------------       PCT 0.25 - 0.50 ng/mL  PCT 0.10 - 0.25 ng/mL               OR       >80% decrease in PCT            Discourage initiation of                                            antibiotics      Encourage discontinuation           of antibiotics    ----------------------------     -----------------------------         PCT >= 0.50 ng/mL               PCT 0.26 - 0.50 ng/mL               AND        <80% decrease in PCT             Encourage initiation of                                             antibiotics       Encourage continuation           of antibiotics    ----------------------------     -----------------------------        PCT >= 0.50 ng/mL                  PCT > 0.50 ng/mL               AND         increase in PCT                  Strongly encourage                                      initiation of antibiotics    Strongly encourage escalation           of antibiotics                                     -----------------------------                                           PCT <= 0.25 ng/mL                                                 OR                                        > 80% decrease in PCT  Discontinue / Do not initiate                                             antibiotics  Performed at [facility], [place] [address], [place], [place] [address]   CK total and CKMB (cardiac)not at [facility]     Status: Abnormal   Collection Time: 09/09/20  4:43 PM  Result Value Ref Range   Total CK 3,499 (H) 49 - 397 U/L    Comment: RESULTS CONFIRMED BY MANUAL DILUTION   CK, MB 28.2 (H) 0.5 - 5.0 ng/mL   Relative Index 0.8 0.0 - 2.5    Comment: Performed at [facility] [address] [place], [place] [address]  Glucose, capillary     Status: Abnormal   Collection Time: 09/09/20  5:06 PM  Result Value Ref Range   Glucose-Capillary 139 (H) 70 - 99 mg/dL    Comment: Glucose reference range applies only to samples taken after fasting for at least 8 hours.  Heparin level (unfractionated)     Status: None   Collection Time: 09/09/20  8:48 PM  Result Value Ref Range   Heparin Unfractionated 0.34 0.30 - 0.70 IU/mL    Comment: (NOTE) If heparin results are below expected values, and patient dosage has  been confirmed, suggest follow up testing of antithrombin III  levels. Performed at [facility], [place] [address], [place], [place] [address]   Sedimentation rate     Status: Abnormal   Collection Time: 09/09/20  8:48 PM  Result Value Ref Range   Sed Rate 104 (H) 0 - 16 mm/hr    Comment: Performed at [facility] [address], [place], [place] [address]  Glucose, capillary     Status: Abnormal   Collection Time: 09/09/20  9:03 PM  Result Value Ref Range   Glucose-Capillary 171 (H) 70 - 99 mg/dL    Comment: Glucose reference range applies only to samples taken after fasting for at least 8 hours.  Heparin level (unfractionated)     Status: None   Collection Time: 09/10/20  1:42 AM  Result Value Ref Range   Heparin Unfractionated 0.39 0.30 - 0.70 IU/mL    Comment: (NOTE) If heparin results are below expected values, and patient dosage has  been confirmed, suggest follow up testing of antithrombin III levels. Performed at [facility], [place] [address], [place], [place] [address]   CBC     Status: Abnormal   Collection Time: 09/10/20  1:42 AM  Result Value Ref Range   WBC 12.1 (H) 4.0 - 10.5 K/uL   RBC 3.94 (L) 4.22 - 5.81 MIL/uL   Hemoglobin 11.5 (L) 13.0 - 17.0 g/dL   HCT 33.4 (L) 39.0 - 52.0 %   MCV 84.8 80.0 - 100.0 fL   MCH 29.2 26.0 - 34.0 pg   MCHC 34.4 30.0 - 36.0 g/dL   RDW 14.6 11.5 - 15.5 %   Platelets 174 150 - 400 K/uL   nRBC 0.0 0.0 - 0.2 %    Comment: Performed at [facility], [place] [address], [place],  [address]  Brain natriuretic peptide     Status: Abnormal   Collection Time: 09/10/20  7:21 AM  Result Value Ref Range   B Natriuretic Peptide 457.4 (H) 0.0 - 100.0 pg/mL  Comment: Performed at [facility], [place] [address], [place], [place] [address]  Basic metabolic panel     Status: Abnormal   Collection Time: 09/10/20  7:21 AM  Result Value Ref Range   Sodium 140 135 - 145 mmol/L   Potassium 3.3 (L) 3.5 - 5.1 mmol/L   Chloride 106 98 - 111 mmol/L   CO2 23 22 - 32 mmol/L    Glucose, Bld 60 (L) 70 - 99 mg/dL    Comment: Glucose reference range applies only to samples taken after fasting for at least 8 hours.   BUN 42 (H) 8 - 23 mg/dL   Creatinine, Ser 2.15 (H) 0.61 - 1.24 mg/dL   Calcium 8.9 8.9 - 10.3 mg/dL   GFR, Estimated 33 (L) >60 mL/min    Comment: (NOTE) Calculated using the CKD-EPI Creatinine Equation (2021)    Anion gap 11 5 - 15    Comment: Performed at [facility] [address], [place], [place] [address]  C-reactive protein     Status: Abnormal   Collection Time: 09/10/20  7:21 AM  Result Value Ref Range   CRP 2.2 (H) <1.0 mg/dL    Comment: Performed at [facility] [address], [place], [place] [address]  Magnesium     Status: Abnormal   Collection Time: 09/10/20  7:21 AM  Result Value Ref Range   Magnesium 2.6 (H) 1.7 - 2.4 mg/dL    Comment: Performed at [facility] [address], [place], [place] [address]  D-dimer, quantitative (not at [facility])     Status: None   Collection Time: 09/10/20  7:21 AM  Result Value Ref Range   D-Dimer, Quant 0.50 0.00 - 0.50 ug/mL-FEU    Comment: (NOTE) At the manufacturer cut-off value of 0.5 g/mL FEU, this assay has a negative predictive value of 95-100%.This assay is intended for use in conjunction with a clinical pretest probability (PTP) assessment model to exclude pulmonary embolism (PE) and deep venous thrombosis (DVT) in outpatients suspected of PE or DVT. Results should be correlated with clinical presentation. Performed at [facility], [place] [address], [place], [place] [address]   CK     Status: Abnormal   Collection Time: 09/10/20  7:21 AM  Result Value Ref Range   Total CK 5,937 (H) 49 - 397 U/L    Comment: RESULTS CONFIRMED BY MANUAL DILUTION Performed at [facility], [place] [address] [place], [place] [address]   Glucose, capillary     Status: Abnormal   Collection Time: 09/10/20  7:58 AM  Result Value Ref Range   Glucose-Capillary 50 (L) 70 - 99  mg/dL    Comment: Glucose reference range applies only to samples taken after fasting for at least 8 hours.  Glucose, capillary     Status: None   Collection Time: 09/10/20 10:03 AM  Result Value Ref Range   Glucose-Capillary 88 70 - 99 mg/dL    Comment: Glucose reference range applies only to samples taken after fasting for at least 8 hours.   CT ABDOMEN PELVIS WO CONTRAST  Result Date: 09/09/2020 CLINICAL DATA:  Weight loss, and intended. EXAM: CT ABDOMEN AND PELVIS WITHOUT CONTRAST TECHNIQUE: Multidetector CT imaging of the abdomen and pelvis was performed following the standard protocol without IV contrast. COMPARISON:  None. FINDINGS: Lower chest: Coronary artery calcifications. Partially visualized cardiac defibrillator. Diffuse reticulations of the visualized lung bases as well as suggestion of emphysematous changes. Possible  tiny hiatal hernia. Hepatobiliary: No focal liver abnormality. Several calcified gallstones within the gallbladder lumen. No gallbladder wall thickening or pericholecystic fluid. No biliary dilatation. Pancreas: No focal lesion. Normal pancreatic contour. No surrounding inflammatory changes. No main pancreatic ductal dilatation. Spleen: Normal in size without focal abnormality. Suggestion of a splenule (3:33). Adrenals/Urinary Tract: No adrenal nodule bilaterally. No nephrolithiasis, no hydronephrosis, and no contour-deforming renal mass. No ureterolithiasis or hydroureter. Urinary bladder wall emphysema. Stomach/Bowel: PO contrast reaches the mid small bowel. Stomach is within normal limits. No evidence of bowel wall thickening or dilatation. Appendix appears normal. Vascular/Lymphatic: No abdominal aorta or iliac aneurysm. Mild atherosclerotic plaque of the aorta and its branches. No abdominal, pelvic, or inguinal lymphadenopathy. Reproductive: Prostate is unremarkable. Other: No intraperitoneal free fluid. No intraperitoneal free gas. No organized fluid collection.  Musculoskeletal: No abdominal wall hernia or abnormality No suspicious lytic or blastic osseous lesions. No acute displaced fracture. Multilevel degenerative changes of the spine. IMPRESSION: 1. Emphysematous cystitis. 2. Please note limited evaluation of the abdomen and pelvis due to noncontrast study. 3. Other imaging findings of potential clinical significance: Fibrosis and emphysematous changes of the lungs. Aortic Atherosclerosis (ICD10-I70.0). Electronically Signed   By: Iven Finn M.D.   On: 09/09/2020 03:09   CT HEAD WO CONTRAST  Result Date: 09/09/2020 CLINICAL DATA:  Neurological deficit.  Acute stroke suspected. EXAM: CT HEAD WITHOUT CONTRAST TECHNIQUE: Contiguous axial images were obtained from the base of the skull through the vertex without intravenous contrast. COMPARISON:  06/01/2014 FINDINGS: Brain: Mild to moderate generalized brain volume loss. Numerous areas of low-density in the hemispheric white matter and basal ganglia consistent with chronic small vessel disease. No sign of acute infarction, mass lesion, hemorrhage, hydrocephalus or extra-axial collection. Vascular: There is atherosclerotic calcification of the major vessels at the base of the brain. Skull: Negative Sinuses/Orbits: Clear/normal Other: None IMPRESSION: No acute finding by CT. Atrophy and chronic small-vessel ischemic changes of the white matter and basal ganglia. Electronically Signed   By: Nelson Chimes M.D.   On: 09/09/2020 00:55   CT CHEST WO CONTRAST  Result Date: 09/09/2020 CLINICAL DATA:  COVID positive. Generalized weakness. Retrosternal chest pain. EXAM: CT CHEST WITHOUT CONTRAST TECHNIQUE: Multidetector CT imaging of the chest was performed following the standard protocol without IV contrast. COMPARISON:  Chest radiograph from one day prior. FINDINGS: Cardiovascular: Top-normal heart size. Single lead left subclavian ICD with lead tip in the right ventricular apex. No significant pericardial  effusion/thickening. Three-vessel coronary atherosclerosis status post CABG. Atherosclerotic nonaneurysmal thoracic aorta. Normal caliber pulmonary arteries. Mediastinum/Nodes: No discrete thyroid nodules. Unremarkable esophagus. No pathologically enlarged axillary, mediastinal or hilar lymph nodes, noting limited sensitivity for the detection of hilar adenopathy on this noncontrast study. Lungs/Pleura: No pneumothorax. No pleural effusion. Mild centrilobular emphysema. Solid 5 mm right middle lobe pulmonary nodule along the minor fissure (series 5/image 75). No acute consolidative airspace disease, lung masses or additional significant pulmonary nodules. Extensive patchy confluent peripheral reticulation and ground-glass opacity throughout both lungs with associated moderate traction bronchiectasis, architectural distortion and volume loss. There is a basilar predominance to these findings. No frank honeycombing. Upper abdomen: Cholelithiasis. Musculoskeletal: No aggressive appearing focal osseous lesions. Intact sternotomy wires. Marked thoracic spondylosis. IMPRESSION: 1. 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 Suggest outpatient pulmonology consultation and follow-up high-resolution chest CT study in 6-12 months. Findings are categorized as probable UIP per consensus guidelines: Diagnosis of Idiopathic Pulmonary Fibrosis: An Official ATS/ERS/JRS/ALAT Clinical Practice Guideline.  [place], Iss 5, [phone], Mar 29 2017. 2. Solid 5 mm right middle lobe pulmonary nodule along the minor fissure. Follow-up noncontrast chest CT recommended in 12 months in this high risk patient. This recommendation follows the consensus statement: Guidelines for Management of Incidental Pulmonary Nodules Detected on CT Images: From the Fleischner Society 2017; Radiology 2017; [id number]. 3. Cholelithiasis. 4. Aortic Atherosclerosis  (ICD10-I70.0) and Emphysema (ICD10-J43.9). Electronically Signed   By: Ilona Sorrel M.D.   On: 09/09/2020 12:08   DG Chest Portable 1 View  Result Date: 09/08/2020 CLINICAL DATA:  Chest pain. EXAM: PORTABLE CHEST 1 VIEW COMPARISON:  September 12, 2010 FINDINGS: A multi lead AICD is noted. Multiple sternal wires are seen. The heart size and mediastinal contours are within normal limits. A radiopaque loop recorder device is seen. There is no evidence of acute infiltrate, pleural effusion or pneumothorax. Degenerative changes are seen throughout the thoracic spine. IMPRESSION: Stable exam without acute or active cardiopulmonary disease. Electronically Signed   By: Virgina Norfolk M.D.   On: 09/08/2020 18:50   VAS [place] LOWER EXTREMITY VENOUS (DVT) (ONLY [facility] & [facility])  Result Date: 09/09/2020  Lower Venous DVT Study Indications: Covid-19.  Limitations: Shadowing from arterial plaque, clothing. Comparison Study: No prior study Performing Technologist: Sharion Dove RVS  Examination Guidelines: A complete evaluation includes B-mode imaging, spectral Doppler, color Doppler, and power Doppler as needed of all accessible portions of each vessel. Bilateral testing is considered an integral part of a complete examination. Limited examinations for reoccurring indications may be performed as noted. The reflux portion of the exam is performed with the patient in reverse Trendelenburg.  +---------+---------------+---------+-----------+----------+-------------------+ RIGHT    CompressibilityPhasicitySpontaneityPropertiesThrombus Aging      +---------+---------------+---------+-----------+----------+-------------------+ CFV                                                   Not well visualized +---------+---------------+---------+-----------+----------+-------------------+ SFJ                                                   Not well visualized  +---------+---------------+---------+-----------+----------+-------------------+ FV Prox  Full           Yes      Yes                                      +---------+---------------+---------+-----------+----------+-------------------+ FV Mid   Full                                                             +---------+---------------+---------+-----------+----------+-------------------+ FV DistalFull                                                             +---------+---------------+---------+-----------+----------+-------------------+ PFV  Not well visualized +---------+---------------+---------+-----------+----------+-------------------+ POP      Partial        Yes      Yes                  Acute               +---------+---------------+---------+-----------+----------+-------------------+ PTV      Full                                                             +---------+---------------+---------+-----------+----------+-------------------+ PERO     None                                         Acute               +---------+---------------+---------+-----------+----------+-------------------+ Gastroc  None                                         Acute               +---------+---------------+---------+-----------+----------+-------------------+   +---------+---------------+---------+-----------+----------+-------------------+ LEFT     CompressibilityPhasicitySpontaneityPropertiesThrombus Aging      +---------+---------------+---------+-----------+----------+-------------------+ CFV                                                   Not well visualized +---------+---------------+---------+-----------+----------+-------------------+ SFJ                                                   Not well visualized +---------+---------------+---------+-----------+----------+-------------------+ FV  Prox  Full           Yes      Yes                                      +---------+---------------+---------+-----------+----------+-------------------+ FV Mid   Full                                                             +---------+---------------+---------+-----------+----------+-------------------+ FV DistalFull           Yes      Yes                                      +---------+---------------+---------+-----------+----------+-------------------+ PFV  Not well visualized +---------+---------------+---------+-----------+----------+-------------------+ POP      Full           Yes      Yes                                      +---------+---------------+---------+-----------+----------+-------------------+ PTV      Full                                                             +---------+---------------+---------+-----------+----------+-------------------+ PERO     Full                                                             +---------+---------------+---------+-----------+----------+-------------------+    Summary: RIGHT: - Findings consistent with acute deep vein thrombosis involving the right popliteal vein, right peroneal veins, and right gastrocnemius veins. - There is no evidence of deep vein thrombosis in the lower extremity. - There is no evidence of superficial venous thrombosis.  LEFT: - No evidence of common femoral vein obstruction.  *See table(s) above for measurements and observations. Electronically signed by Harold Barban MD on 09/09/2020 at 7:57:19 PM.    Final     . cefTRIAXone (ROCEPHIN)  IV Stopped (09/09/20 1320)  . heparin 1,300 Units/hr (09/10/20 0653)  . lactated ringers 50 mL/hr at 09/10/20 2500   Anti-infectives (From admission, onward)   Start     Dose/Rate Route Frequency Ordered Stop   09/09/20 1300  cefTRIAXone (ROCEPHIN) 2 g in sodium chloride 0.9 % 100 mL IVPB         2 g 200 mL/hr over 30 Minutes Intravenous Every 24 hours 09/09/20 1109        Assessment/Plan NSTEMI (demand ischemia per cardiology)  - troponin 13,971>> 10,598>>6,373 Rhabdomyolysis   - CK 8423>>6,921>>3499>>5937 Hx CAD/CABG times 4 2006  -On Plavix/ASA Hx VF arrest/ICD Type 2 diabetes Possible BPH Covid positive  - single J & J vaccine Residual lung disease/unusual interstitial pneumonia pattern  - CCM work-up pending Possible DVT CKD - baseline creatinine 2.03-2.22  - creatine today 2.15 Weight loss Hyperlipidemia   Extremity weakness/weight loss of uncertain etiology  -  Multiple antibody testing pending; ANA, Anti-DNA, CCPA ,RA,Mpo/Pr-3, GBMA, Sjogrens,      antiscleroderama,   - neurology consult pending   FEN: Heart healthy carb modified ID: Listed above DVT: Heparin drip  Plan: Cardiology plans to discontinue his statin.  He has multiple antibody studies pending, along with neurology consult.  His proximal weakness is improving.  He is also on anticoagulation.  Will defer muscle biopsy until complete medical work-up is completed.  Earnstine Regal [facility] Surgery 09/10/2020, 10:11 AM Please see Amion for pager number during day hours 7:00am-4:30pm

## 2020-09-10 NOTE — Evaluation (Signed)
Physical Therapy Evaluation Patient Details Name: Eugene [name] MRN: [id number] DOB: 02-14-1956 Today's Date: 09/10/2020   History of Present Illness  Pt is a 65 y.o. male admitted 09/08/20 with 1-2 wk h/o retrosternal chest pain and BLE weakness, as well as unintentional weight loss. Workup for NSTEMI, transaminitis, UTI; incidental COVID-19. (+) RLE DVT on 2/12. Head CT negative for acute injury. PMH includes CAD (s/p CABG, PCI; vfib arrest s/p ICD), CKD IV, HTN, DM.  Clinical Impression  Pt presents to PT with deficits in strength, power, balance, endurance, gait, and functional mobility. Pt si BLE weakness but more significant weakness in RLE. Pt is able to stand and transfer with PT assistance due to limited LE power. Pt will benefit from aggressive mobilization and acute PT POC to improve mobility and reduce falls risk. PT recommends discharge home with HHPT, a RW, and a 3in1 commode, pending continued progress.    Follow Up Recommendations Home health PT;Supervision for mobility/OOB    Equipment Recommendations  Rolling walker with 5" wheels;3in1 (PT)    Recommendations for Other Services       Precautions / Restrictions Precautions Precautions: Fall Restrictions Weight Bearing Restrictions: No      Mobility  Bed Mobility Overal bed mobility: Needs Assistance Bed Mobility: Supine to Sit     Supine to sit: Min assist;HOB elevated          Transfers Overall transfer level: Needs assistance Equipment used: Rolling walker (2 wheeled);1 person hand held assist Transfers: Sit to/from [facility] Sit to Stand: Min assist Stand pivot transfers: Min assist       General transfer comment: minA to power up into standing, cues for hand placement with stand to sit  Ambulation/Gait Ambulation/Gait assistance: Min guard Gait Distance (Feet): 20 Feet Assistive device: Rolling walker (2 wheeled) Gait Pattern/deviations: Step-to pattern Gait velocity:  reduced Gait velocity interpretation: <1.31 ft/sec, indicative of household ambulator General Gait Details: pt with short step-to gait  Stairs            Wheelchair Mobility    Modified Rankin (Stroke Patients Only)       Balance Overall balance assessment: Needs assistance Sitting-balance support: Single extremity supported;Feet supported Sitting balance-Leahy Scale: Poor Sitting balance - Comments: reliant on UE support, one posterior LOB without UE support Postural control: Posterior lean Standing balance support: Single extremity supported;Bilateral upper extremity supported Standing balance-Leahy Scale: Poor Standing balance comment: reliant on UE support of RW and minG                             Pertinent Vitals/Pain Pain Assessment: No/denies pain    Home Living Family/patient expects to be discharged to:: Private residence Living Arrangements: Spouse/significant other Available Help at Discharge: Family;Available 24 hours/day Type of Home: House Home Access: Stairs to enter;Ramped entrance Entrance Stairs-Rails: Can reach both Entrance Stairs-Number of Steps: 5 Home Layout: One level Home Equipment: None      Prior Function Level of Independence: Independent         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    Extremity/Trunk Assessment   Upper Extremity Assessment Upper Extremity Assessment: Overall WFL for tasks assessed    Lower Extremity Assessment Lower Extremity Assessment: Generalized weakness (LLE 4/5 grossly, RLE 3+/5 hip flexion 4/5 otherwise)    Cervical / Trunk Assessment Cervical / Trunk Assessment: Normal  Communication   Communication: No  difficulties  Cognition Arousal/Alertness: Awake/alert Behavior During Therapy: WFL for tasks assessed/performed Overall Cognitive Status: Within Functional Limits for tasks assessed                                         General Comments General comments (skin integrity, edema, etc.): VSS on RA    Exercises     Assessment/Plan    PT Assessment Patient needs continued PT services  PT Problem List Decreased strength;Decreased activity tolerance;Decreased balance;Decreased mobility;Decreased knowledge of use of DME;Decreased knowledge of precautions       PT Treatment Interventions DME instruction;Gait training;Functional mobility training;Therapeutic activities;Therapeutic exercise;Balance training;Neuromuscular re-education;Patient/family education    PT Goals (Current goals can be found in the Care Plan section)  Acute Rehab PT Goals Patient Stated Goal: to improve strength and go home PT Goal Formulation: With patient Time For Goal Achievement: 09/24/20 Potential to Achieve Goals: Good    Frequency Min 3X/week (see often for home discharge)   Barriers to discharge        Co-evaluation               AM-PAC PT "6 Clicks" Mobility  Outcome Measure Help needed turning from your back to your side while in a flat bed without using bedrails?: A Little Help needed moving from lying on your back to sitting on the side of a flat bed without using bedrails?: A Little Help needed moving to and from a bed to a chair (including a wheelchair)?: A Little Help needed standing up from a chair using your arms (e.g., wheelchair or bedside chair)?: A Little Help needed to walk in hospital room?: A Little Help needed climbing 3-5 steps with a railing? : A Lot 6 Click Score: 17    End of Session   Activity Tolerance: Patient tolerated treatment well Patient left: in chair;with call bell/phone within reach Nurse Communication: Mobility status PT Visit Diagnosis: Unsteadiness on feet (R26.81);Other abnormalities of gait and mobility (R26.89);Muscle weakness (generalized) (M62.81)    Time: 1610-9604 PT Time Calculation (min) (ACUTE ONLY): 29 min   Charges:   PT Evaluation $PT Eval Moderate  Complexity: 1 Mod PT Treatments $Therapeutic Activity: 8-22 mins        Zenaida Niece, PT, DPT Acute Rehabilitation Pager: [phone]   Zenaida Niece 09/10/2020, 9:56 AM

## 2020-09-10 NOTE — Progress Notes (Signed)
Progress Note  Patient Name: Eugene [name] Date of Encounter: 09/10/2020  Primary Cardiologist: Cristopher Peru, MD   Subjective   Can move legs again.  No SOB, DOE/CP.  Inpatient Medications    Scheduled Meds: . aspirin EC  81 mg Oral Daily  . clopidogrel  75 mg Oral Daily  . insulin aspart  0-5 Units Subcutaneous QHS  . insulin aspart  0-9 Units Subcutaneous TID WC  . insulin glargine  70 Units Subcutaneous Daily  . metoprolol succinate  50 mg Oral Daily  . pantoprazole  40 mg Oral Q1200  . potassium chloride  40 mEq Oral BID   Continuous Infusions: . cefTRIAXone (ROCEPHIN)  IV Stopped (09/09/20 1320)  . heparin 1,300 Units/hr (09/10/20 0653)  . lactated ringers 50 mL/hr at 09/10/20 0838   PRN Meds: acetaminophen, nitroGLYCERIN   Vital Signs    Vitals:   09/09/20 1557 09/09/20 2000 09/10/20 0000 09/10/20 0400  BP: 99/66 96/60 100/69 108/67  Pulse: 96 84 74 66  Resp: 20 17 20 18   Temp:  98.3 F (36.8 C) 98 F (36.7 C) 98 F (36.7 C)  TempSrc:  Oral Oral Oral  SpO2: 95% 96% 92% 95%  Weight:      Height:        Intake/Output Summary (Last 24 hours) at 09/10/2020 1210 Last data filed at 09/10/2020 1049 Gross per 24 hour  Intake 100 ml  Output 1 ml  Net 99 ml   Filed Weights   09/08/20 1808  Weight: 73.9 kg    Telemetry    SR - Personally Reviewed  ECG    No new - Personally Reviewed  Physical Exam   GEN: No acute distress.   Neck: No JVD, Bilateral Frank's Sign Cardiac: RRR, no murmurs, rubs, or gallops.  Respiratory: Clear to auscultation bilaterally. GI: Soft, nontender, non-distended  MS: No edema; No deformity. Neuro:  Nonfocal  Psych: Normal affect   Labs    Chemistry Recent Labs  Lab 09/06/20 0720 09/08/20 1823 09/09/20 0409 09/10/20 0721  NA 135 137 136 140  K 4.2 3.5 2.8* 3.3*  CL 95* 102 103 106  CO2 18* 19* 17* 23  GLUCOSE 274* 134* 89 60*  BUN 41* 55* 51* 42*  CREATININE 2.74* 2.80* 2.56* 2.15*  CALCIUM 9.4 9.2 9.0  8.9  PROT  --  7.0 6.1*  --   ALBUMIN  --  3.1* 2.8*  --   AST  --  208* 182*  --   ALT  --  173* 154*  --   ALKPHOS  --  58 46  --   BILITOT  --  1.6* 1.3*  --   GFRNONAA 23* 24* 27* 33*  GFRAA 27*  --   --   --   ANIONGAP  --  16* 16* 11     Hematology Recent Labs  Lab 09/08/20 1823 09/09/20 0409 09/10/20 0142  WBC 13.2* 12.2* 12.1*  RBC 4.59 4.32 3.94*  HGB 13.0 12.3* 11.5*  HCT 39.9 37.8* 33.4*  MCV 86.9 87.5 84.8  MCH 28.3 28.5 29.2  MCHC 32.6 32.5 34.4  RDW 14.3 14.4 14.6  PLT 177 168 174    Cardiac EnzymesNo results for input(s): TROPONINI in the last 168 hours. No results for input(s): TROPIPOC in the last 168 hours.   BNP Recent Labs  Lab 09/10/20 0721  BNP 457.4*     DDimer  Recent Labs  Lab 09/10/20 0721  DDIMER 0.50  Radiology    CT ABDOMEN PELVIS WO CONTRAST  Result Date: 09/09/2020 CLINICAL DATA:  Weight loss, and intended. EXAM: CT ABDOMEN AND PELVIS WITHOUT CONTRAST TECHNIQUE: Multidetector CT imaging of the abdomen and pelvis was performed following the standard protocol without IV contrast. COMPARISON:  None. FINDINGS: Lower chest: Coronary artery calcifications. Partially visualized cardiac defibrillator. Diffuse reticulations of the visualized lung bases as well as suggestion of emphysematous changes. Possible tiny hiatal hernia. Hepatobiliary: No focal liver abnormality. Several calcified gallstones within the gallbladder lumen. No gallbladder wall thickening or pericholecystic fluid. No biliary dilatation. Pancreas: No focal lesion. Normal pancreatic contour. No surrounding inflammatory changes. No main pancreatic ductal dilatation. Spleen: Normal in size without focal abnormality. Suggestion of a splenule (3:33). Adrenals/Urinary Tract: No adrenal nodule bilaterally. No nephrolithiasis, no hydronephrosis, and no contour-deforming renal mass. No ureterolithiasis or hydroureter. Urinary bladder wall emphysema. Stomach/Bowel: PO contrast reaches  the mid small bowel. Stomach is within normal limits. No evidence of bowel wall thickening or dilatation. Appendix appears normal. Vascular/Lymphatic: No abdominal aorta or iliac aneurysm. Mild atherosclerotic plaque of the aorta and its branches. No abdominal, pelvic, or inguinal lymphadenopathy. Reproductive: Prostate is unremarkable. Other: No intraperitoneal free fluid. No intraperitoneal free gas. No organized fluid collection. Musculoskeletal: No abdominal wall hernia or abnormality No suspicious lytic or blastic osseous lesions. No acute displaced fracture. Multilevel degenerative changes of the spine. IMPRESSION: 1. Emphysematous cystitis. 2. Please note limited evaluation of the abdomen and pelvis due to noncontrast study. 3. Other imaging findings of potential clinical significance: Fibrosis and emphysematous changes of the lungs. Aortic Atherosclerosis (ICD10-I70.0). Electronically Signed   By: Iven Finn M.D.   On: 09/09/2020 03:09   CT HEAD WO CONTRAST  Result Date: 09/09/2020 CLINICAL DATA:  Neurological deficit.  Acute stroke suspected. EXAM: CT HEAD WITHOUT CONTRAST TECHNIQUE: Contiguous axial images were obtained from the base of the skull through the vertex without intravenous contrast. COMPARISON:  06/01/2014 FINDINGS: Brain: Mild to moderate generalized brain volume loss. Numerous areas of low-density in the hemispheric white matter and basal ganglia consistent with chronic small vessel disease. No sign of acute infarction, mass lesion, hemorrhage, hydrocephalus or extra-axial collection. Vascular: There is atherosclerotic calcification of the major vessels at the base of the brain. Skull: Negative Sinuses/Orbits: Clear/normal Other: None IMPRESSION: No acute finding by CT. Atrophy and chronic small-vessel ischemic changes of the white matter and basal ganglia. Electronically Signed   By: Nelson Chimes M.D.   On: 09/09/2020 00:55   CT CHEST WO CONTRAST  Result Date:  09/09/2020 CLINICAL DATA:  COVID positive. Generalized weakness. Retrosternal chest pain. EXAM: CT CHEST WITHOUT CONTRAST TECHNIQUE: Multidetector CT imaging of the chest was performed following the standard protocol without IV contrast. COMPARISON:  Chest radiograph from one day prior. FINDINGS: Cardiovascular: Top-normal heart size. Single lead left subclavian ICD with lead tip in the right ventricular apex. No significant pericardial effusion/thickening. Three-vessel coronary atherosclerosis status post CABG. Atherosclerotic nonaneurysmal thoracic aorta. Normal caliber pulmonary arteries. Mediastinum/Nodes: No discrete thyroid nodules. Unremarkable esophagus. No pathologically enlarged axillary, mediastinal or hilar lymph nodes, noting limited sensitivity for the detection of hilar adenopathy on this noncontrast study. Lungs/Pleura: No pneumothorax. No pleural effusion. Mild centrilobular emphysema. Solid 5 mm right middle lobe pulmonary nodule along the minor fissure (series 5/image 75). No acute consolidative airspace disease, lung masses or additional significant pulmonary nodules. Extensive patchy confluent peripheral reticulation and ground-glass opacity throughout both lungs with associated moderate traction bronchiectasis, architectural distortion and volume loss. There is a  basilar predominance to these findings. No frank honeycombing. Upper abdomen: Cholelithiasis. Musculoskeletal: No aggressive appearing focal osseous lesions. Intact sternotomy wires. Marked thoracic spondylosis. IMPRESSION: 1. 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 Suggest outpatient pulmonology consultation and follow-up high-resolution chest CT study in 6-12 months. Findings are categorized as probable UIP per consensus guidelines: Diagnosis of Idiopathic Pulmonary Fibrosis: An Official ATS/ERS/JRS/ALAT Clinical Practice Guideline. [place], Iss 5, [phone], Mar 29 2017. 2. Solid 5 mm right middle lobe pulmonary nodule along the minor fissure. Follow-up noncontrast chest CT recommended in 12 months in this high risk patient. This recommendation follows the consensus statement: Guidelines for Management of Incidental Pulmonary Nodules Detected on CT Images: From the Fleischner Society 2017; Radiology 2017; 284:228-243. 3. Cholelithiasis. 4. Aortic Atherosclerosis (ICD10-I70.0) and Emphysema (ICD10-J43.9). Electronically Signed   By: Ilona Sorrel M.D.   On: 09/09/2020 12:08   DG Chest Portable 1 View  Result Date: 09/08/2020 CLINICAL DATA:  Chest pain. EXAM: PORTABLE CHEST 1 VIEW COMPARISON:  September 12, 2010 FINDINGS: A multi lead AICD is noted. Multiple sternal wires are seen. The heart size and mediastinal contours are within normal limits. A radiopaque loop recorder device is seen. There is no evidence of acute infiltrate, pleural effusion or pneumothorax. Degenerative changes are seen throughout the thoracic spine. IMPRESSION: Stable exam without acute or active cardiopulmonary disease. Electronically Signed   By: Virgina Norfolk M.D.   On: 09/08/2020 18:50   VAS [place] LOWER EXTREMITY VENOUS (DVT) (ONLY [facility] & [facility])  Result Date: 09/09/2020  Lower Venous DVT Study Indications: Covid-19.  Limitations: Shadowing from arterial plaque, clothing. Comparison Study: No prior study Performing Technologist: Sharion Dove RVS  Examination Guidelines: A complete evaluation includes B-mode imaging, spectral Doppler, color Doppler, and power Doppler as needed of all accessible portions of each vessel. Bilateral testing is considered an integral part of a complete examination. Limited examinations for reoccurring indications may be performed as noted. The reflux portion of the exam is performed with the patient in reverse Trendelenburg.  +---------+---------------+---------+-----------+----------+-------------------+ RIGHT     CompressibilityPhasicitySpontaneityPropertiesThrombus Aging      +---------+---------------+---------+-----------+----------+-------------------+ CFV                                                   Not well visualized +---------+---------------+---------+-----------+----------+-------------------+ SFJ                                                   Not well visualized +---------+---------------+---------+-----------+----------+-------------------+ FV Prox  Full           Yes      Yes                                      +---------+---------------+---------+-----------+----------+-------------------+ FV Mid   Full                                                             +---------+---------------+---------+-----------+----------+-------------------+  FV DistalFull                                                             +---------+---------------+---------+-----------+----------+-------------------+ PFV                                                   Not well visualized +---------+---------------+---------+-----------+----------+-------------------+ POP      Partial        Yes      Yes                  Acute               +---------+---------------+---------+-----------+----------+-------------------+ PTV      Full                                                             +---------+---------------+---------+-----------+----------+-------------------+ PERO     None                                         Acute               +---------+---------------+---------+-----------+----------+-------------------+ Gastroc  None                                         Acute               +---------+---------------+---------+-----------+----------+-------------------+   +---------+---------------+---------+-----------+----------+-------------------+ LEFT     CompressibilityPhasicitySpontaneityPropertiesThrombus Aging       +---------+---------------+---------+-----------+----------+-------------------+ CFV                                                   Not well visualized +---------+---------------+---------+-----------+----------+-------------------+ SFJ                                                   Not well visualized +---------+---------------+---------+-----------+----------+-------------------+ FV Prox  Full           Yes      Yes                                      +---------+---------------+---------+-----------+----------+-------------------+ FV Mid   Full                                                             +---------+---------------+---------+-----------+----------+-------------------+  FV DistalFull           Yes      Yes                                      +---------+---------------+---------+-----------+----------+-------------------+ PFV                                                   Not well visualized +---------+---------------+---------+-----------+----------+-------------------+ POP      Full           Yes      Yes                                      +---------+---------------+---------+-----------+----------+-------------------+ PTV      Full                                                             +---------+---------------+---------+-----------+----------+-------------------+ PERO     Full                                                             +---------+---------------+---------+-----------+----------+-------------------+    Summary: RIGHT: - Findings consistent with acute deep vein thrombosis involving the right popliteal vein, right peroneal veins, and right gastrocnemius veins. - There is no evidence of deep vein thrombosis in the lower extremity. - There is no evidence of superficial venous thrombosis.  LEFT: - No evidence of common femoral vein obstruction.  *See table(s) above for measurements and observations.  Electronically signed by Harold Barban MD on 09/09/2020 at 7:57:19 PM.    Final     Cardiac Studies   No change in LVEF  Patient Profile     65 y.o. male new muscle weakness  Assessment & Plan   NSTEMI in the setting of COVID-19 (demand ischemia) DM with HTN, DM with HLD PAD Transaminitis - continue ASA, Plavix - heparin is now for possible DVT; if leaving on DOAC would do plavix and DOAC - continue BB - Conntinue PRN Nitro - unless definitive for muscle weakness if found; will go hom off statin  HFpEF CKD Stage IIIb - at some point through course will need return of diuresis (lasix likely reduced to 20 mg and Farxiga) For questions or updates, please contact [facility] Please consult [email or web address] for contact info under Cardiology/STEMI.      Signed, Werner Lean, MD  09/10/2020, 12:10 PM

## 2020-09-10 NOTE — Progress Notes (Signed)
[facility] for Heparin Indication: chest pain/ACS  No Known Allergies  Patient Measurements: Height: 5\' 6"  (167.6 cm) Weight: 73.9 kg (163 lb) IBW/kg (Calculated) : 63.8 Heparin Dosing Weight: 73.9 kg  Vital Signs: Temp: 98 F (36.7 C) (02/13 0400) Temp Source: Oral (02/13 0400) BP: 108/67 (02/13 0400) Pulse Rate: 66 (02/13 0400)  Labs: Recent Labs    09/08/20 1823 09/08/20 1823 09/08/20 2121 09/09/20 0409 09/09/20 1206 09/09/20 1643 09/09/20 2048 09/10/20 0142  HGB 13.0  --   --  12.3*  --   --   --  11.5*  HCT 39.9  --   --  37.8*  --   --   --  33.4*  PLT 177  --   --  168  --   --   --  174  HEPARINUNFRC  --    < >  --  0.15* 0.31  --  0.34 0.39  CREATININE 2.80*  --   --  2.56*  --   --   --   --   CKTOTAL  --   --  8,423* 6,381*  --  3,499*  --   --   CKMB  --   --   --   --   --  28.2*  --   --   TROPONINIHS 10,598*  --  13,971* 6,373*  --   --   --   --    < > = values in this interval not displayed.    Estimated Creatinine Clearance: 26 mL/min (A) (by C-G formula based on SCr of 2.56 mg/dL (H)).  Assessment: 65 y.o. male with chest pain to continue on IV heparin.  Heparin level is therapeutic at 0.39; no bleeding reported. Hgb 11.5 slightly downtrending. Scr improving.   Goal of Therapy:  Heparin level 0.3-0.7 units/ml Monitor platelets by anticoagulation protocol: Yes   Plan:  Continue heparin infusion @ 1300 units/hr  F/u AM labs  Cephus Slater, PharmD, [place] Resident [phone] 09/10/2020 8:04 AM

## 2020-09-10 NOTE — Progress Notes (Signed)
Pt complaining of CP of 3/10, EKG obtained, lab drawing current troponins, one round of nitro given, and Dr. Myna Hidalgo notified.  Awaiting any new orders.  Will continue to monitor the patient.

## 2020-09-10 NOTE — Progress Notes (Signed)
PROGRESS NOTE                                                                                                                                                                                                             Patient Demographics:    Eugene [name], is a 65 y.o. male, DOB - [birth year], EYE:[id number]  Outpatient Primary MD for the patient is [name], MD   Admit date - 09/08/2020   LOS - 1  Chief Complaint  Patient presents with  . Weakness       Brief Narrative: Patient is a 65 y.o. male with PMHx of history of CAD s/p CABG-subsequent recent PCI in 06/2020-V. fib arrest-s/p ICD implantation, CKD stage IV, HTN, DM, HLD who presented to the hospital with 1-2-week history of retrosternal chest pain responsive to nitroglycerin tablets-and 1 week history of bilateral lower extremity weakness.  Per patient-approximately 6 months ago-he noted that he was having trouble walking up a flight of stairs-and had actually put a ramp to get to his house from his garage.  He also acknowledges having unintentional weight loss-around 40-50 pounds in the past 1 year  COVID-19 vaccinated status: Vaccinated-J&J vaccine-but not boosted.  Significant Events: 2/11>> Admit to [facility] for non-STEMI-and bilateral lower extremity weakness.  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  Bilateral leg weakness somewhat better.   Assessment  & Plan :   Non-STEMI: Chest pain-free-continue medical management with aspirin/Plavix/beta-blocker/IV heparin.  Statins on hold due to significantly elevated CK levels.  Has extensive history of CAD-s/p CABG-and most recent PCI in December 2021.  Cards following.  Bilateral lower extremity weakness with ++ CK: Going on for at least 6 months-but markedly worse for the past 1 week or so.  Per history-patient noted that he was having difficulty climbing stairs 6 months back.  He  is just about able to lift his legs off the bed against gravity-and mild resistance-bilateral knee reflexes are positive but weak.  He has no back pain.  Has had 40-50 pounds of unintentional weight loss this past year.  CK significantly elevated.  TSH/ESR/vitamin B12 ordered-have asked neurology to evaluate and provide further recommendations.  Requested general surgery to consider lower extremity muscle biopsy as well.  We will also get leg MRI to evaluate any visible signs of inflammation.  Neurology following defer further work-up to neurology.  Incidental Covid infection with evidence of interstitial lung disease on imaging.  Asymptomatic from COVID-19 standpoint, pulmonary following defer work-up of pulmonary fibrosis to pulmonary.    Acute lower extremity DVT.  For now heparin drip.  Transaminitis: Likely related to rhabdomyolysis-CK elevation.  No RUQ pain.  Complicated UTI: Probably related to diabetes-start Rocephin-have asked RN to send urine culture before starting antibiotics. He does acknowledge dysuria for the past several weeks.  Unintentional 40-50 pound weight loss in the past 1 year: Concern for undiagnosed malignancy-apparently was supposed to get colonoscopy-but due to recent PCI/dual antiplatelet agents this was placed on hold. Noncontrast CT abdomen/pelvis without any obvious malignancy. Will obtain  noncontrast CT as well. Await TSH. If inpatient work-up is negative-further work-up will need to be completed in the outpatient setting.  CKD stage IV: Creatinine at baseline-follow-avoid nephrotoxic agents  Hypokalemia: Replete and recheck.  DM-2 (A1c 7.7 on 07/06/2020): On Lantus to 60 units, continue SSI. Follow and adjust.   Recent Labs    09/09/20 2103 09/10/20 0758 09/10/20 1003  GLUCAP 171* 50* 88   COVID-19 infection: Incidental-asymptomatic-Remdesivir x3 days.  Lab Results  Component Value Date   SARSCOV2NAA POSITIVE (A) 09/09/2020   Fidelity NEGATIVE 07/05/2020      GI prophylaxis: PPI  Condition - Guarded  Family Communication  :  [name]-Significant other-[phone]-updated on 09/10/20  Code Status :  Full Code  Diet :  Diet Order            Diet heart healthy/carb modified Room service appropriate? Yes; Fluid consistency: Thin  Diet effective now                  Disposition Plan  :   Status is: Inpatient  Remains inpatient appropriate because:Inpatient level of care appropriate due to severity of illness   Dispo: The patient is from: Home              Anticipated d/c is to: TBD              Anticipated d/c date is: > 3 days              Patient currently is not medically stable to d/c.   Difficult to place patient No  Barriers to discharge: Hypoxia requiring O2 supplementation/complete 5 days of IV Remdesivir  Antimicorbials  :    Anti-infectives (From admission, onward)   Start     Dose/Rate Route Frequency Ordered Stop   09/09/20 1300  cefTRIAXone (ROCEPHIN) 2 g in sodium chloride 0.9 % 100 mL IVPB        2 g 200 mL/hr over 30 Minutes Intravenous Every 24 hours 09/09/20 1109        Inpatient Medications  Scheduled Meds: . aspirin EC  81 mg Oral Daily  . clopidogrel  75 mg Oral Daily  . insulin aspart  0-5 Units Subcutaneous QHS  . insulin aspart  0-9 Units Subcutaneous TID WC  . insulin glargine  70  Units Subcutaneous Daily  .  metoprolol succinate  50 mg Oral Daily  . pantoprazole  40 mg Oral Q1200   Continuous Infusions: . cefTRIAXone (ROCEPHIN)  IV Stopped (09/09/20 1320)  . heparin 1,300 Units/hr (09/10/20 0653)  . lactated ringers 50 mL/hr at 09/10/20 0838   PRN Meds:.acetaminophen, nitroGLYCERIN   Time Spent in minutes  25   See all Orders from today for further details   Lala Lund M.D on 09/10/2020 at 11:23 AM  To page go to [email or web address] - use universal password  [facility] -  Office  [phone]    Objective:   Vitals:   09/09/20 1557 09/09/20 2000 09/10/20 0000 09/10/20 0400  BP: 99/66 96/60 100/69 108/67  Pulse: 96 84 74 66  Resp: 20 17 20 18   Temp:  98.3 F (36.8 C) 98 F (36.7 C) 98 F (36.7 C)  TempSrc:  Oral Oral Oral  SpO2: 95% 96% 92% 95%  Weight:      Height:        Wt Readings from Last 3 Encounters:  09/08/20 73.9 kg  08/04/20 80.2 kg  07/26/20 77.1 kg     Intake/Output Summary (Last 24 hours) at 09/10/2020 1123 Last data filed at 09/10/2020 1049 Gross per 24 hour  Intake 100 ml  Output 1 ml  Net 99 ml     Physical Exam  Awake Alert, No new F.N deficits, Normal affect Fowler.AT,PERRAL Supple Neck,No JVD, No cervical lymphadenopathy appriciated.  Symmetrical Chest wall movement, Good air movement bilaterally, CTAB RRR,No Gallops, Rubs or new Murmurs, No Parasternal Heave +ve B.Sounds, Abd Soft, No tenderness, No organomegaly appriciated, No rebound - guarding or rigidity. No Cyanosis, Clubbing or edema, No new Rash or bruise    Data Review:    CBC Recent Labs  Lab 09/08/20 1823 09/09/20 0409 09/10/20 0142  WBC 13.2* 12.2* 12.1*  HGB 13.0 12.3* 11.5*  HCT 39.9 37.8* 33.4*  PLT 177 168 174  MCV 86.9 87.5 84.8  MCH 28.3 28.5 29.2  MCHC 32.6 32.5 34.4  RDW 14.3 14.4 14.6  LYMPHSABS 2.3  --   --   MONOABS 1.2*  --   --   EOSABS 0.1  --   --   BASOSABS 0.0  --   --     Chemistries  Recent Labs  Lab 09/06/20 0720 09/08/20 1823  09/09/20 0409 09/09/20 1643 09/10/20 0721  NA 135 137 136  --  140  K 4.2 3.5 2.8*  --  3.3*  CL 95* 102 103  --  106  CO2 18* 19* 17*  --  23  GLUCOSE 274* 134* 89  --  60*  BUN 41* 55* 51*  --  42*  CREATININE 2.74* 2.80* 2.56*  --  2.15*  CALCIUM 9.4 9.2 9.0  --  8.9  MG  --   --   --  1.8 2.6*  AST  --  208* 182*  --   --   ALT  --  173* 154*  --   --   ALKPHOS  --  58 46  --   --   BILITOT  --  1.6* 1.3*  --   --    ------------------------------------------------------------------------------------------------------------------ Recent Labs    09/09/20 0409  CHOL 117  HDL 29*  LDLCALC 46  TRIG 211*  CHOLHDL 4.0    Lab Results  Component Value Date   HGBA1C 7.7 (H) 07/06/2020   ------------------------------------------------------------------------------------------------------------------ Recent Labs    09/09/20 1206  TSH  2.186   ------------------------------------------------------------------------------------------------------------------ Recent Labs    09/09/20 1206  VITAMINB12 91*    Coagulation profile No results for input(s): INR, PROTIME in the last 168 hours.  Recent Labs    09/10/20 0721  DDIMER 0.50    Cardiac Enzymes Recent Labs  Lab 09/09/20 1643  CKMB 28.2*   ------------------------------------------------------------------------------------------------------------------    Component Value Date/Time   BNP 457.4 (H) 09/10/2020 0721    Micro Results Recent Results (from the past 240 hour(s))  Resp Panel by RT-PCR (Flu A&B, Covid) Nasopharyngeal Swab     Status: Abnormal   Collection Time: 09/09/20 12:32 AM   Specimen: Nasopharyngeal Swab; Nasopharyngeal(NP) swabs in vial transport medium  Result Value Ref Range Status   SARS Coronavirus 2 by RT PCR POSITIVE (A) NEGATIVE Final    Comment: RESULT CALLED TO, READ BACK BY AND VERIFIED WITH: Elige Radon RN 09/09/20 0224 JDW (NOTE) SARS-CoV-2 target nucleic acids are  DETECTED.  The SARS-CoV-2 RNA is generally detectable in upper respiratory specimens during the acute phase of infection. Positive results are indicative of the presence of the identified virus, but do not rule out bacterial infection or co-infection with other pathogens not detected by the test. Clinical correlation with patient history and other diagnostic information is necessary to determine patient infection status. The expected result is Negative.  Fact Sheet for Patients: EntrepreneurPulse.com.au  Fact Sheet for Healthcare Providers: IncredibleEmployment.be  This test is not yet approved or cleared by the [place] FDA and  has been authorized for detection and/or diagnosis of SARS-CoV-2 by FDA under an Emergency Use Authorization (EUA).  This EUA will remain in effect (meaning this test can be [place] ed) for the duration of  the COVID-19 declaration under Section 564(b)(1) of the Act, 21 U.S.C. section 360bbb-3(b)(1), unless the authorization is terminated or revoked sooner.     Influenza A by PCR NEGATIVE NEGATIVE Final   Influenza B by PCR NEGATIVE NEGATIVE Final    Comment: (NOTE) The Xpert Xpress SARS-CoV-2/FLU/RSV plus assay is intended as an aid in the diagnosis of influenza from Nasopharyngeal swab specimens and should not be used as a sole basis for treatment. Nasal washings and aspirates are unacceptable for Xpert Xpress SARS-CoV-2/FLU/RSV testing.  Fact Sheet for Patients: EntrepreneurPulse.com.au  Fact Sheet for Healthcare Providers: IncredibleEmployment.be  This test is not yet approved or cleared by the [place] FDA and has been authorized for detection and/or diagnosis of SARS-CoV-2 by FDA under an Emergency Use Authorization (EUA). This EUA will remain in effect (meaning this test can be used) for the duration of the COVID-19 declaration under Section 564(b)(1) of the Act, 21  U.S.C. section 360bbb-3(b)(1), unless the authorization is terminated or revoked.  Performed at [facility], [place] [address] [place], [place] [address]   Urine Culture     Status: Abnormal (Preliminary result)   Collection Time: 09/09/20 10:32 AM   Specimen: Urine, Random  Result Value Ref Range Status   Specimen Description URINE, RANDOM  Final   Special Requests   Final    Normal Performed at [facility], [place] [address], [place], [place] [address]    Culture >=100,000 COLONIES/mL GRAM NEGATIVE RODS (A)  Final   Report Status PENDING  Incomplete    Radiology Reports CT ABDOMEN PELVIS WO CONTRAST  Result Date: 09/09/2020 CLINICAL DATA:  Weight loss, and intended. EXAM: CT ABDOMEN AND PELVIS WITHOUT CONTRAST TECHNIQUE: Multidetector CT imaging of the abdomen and pelvis was performed following the standard  protocol without IV contrast. COMPARISON:  None. FINDINGS: Lower chest: Coronary artery calcifications. Partially visualized cardiac defibrillator. Diffuse reticulations of the visualized lung bases as well as suggestion of emphysematous changes. Possible tiny hiatal hernia. Hepatobiliary: No focal liver abnormality. Several calcified gallstones within the gallbladder lumen. No gallbladder wall thickening or pericholecystic fluid. No biliary dilatation. Pancreas: No focal lesion. Normal pancreatic contour. No surrounding inflammatory changes. No main pancreatic ductal dilatation. Spleen: Normal in size without focal abnormality. Suggestion of a splenule (3:33). Adrenals/Urinary Tract: No adrenal nodule bilaterally. No nephrolithiasis, no hydronephrosis, and no contour-deforming renal mass. No ureterolithiasis or hydroureter. Urinary bladder wall emphysema. Stomach/Bowel: PO contrast reaches the mid small bowel. Stomach is within normal limits. No evidence of bowel wall thickening or dilatation. Appendix appears normal. Vascular/Lymphatic: No abdominal aorta or iliac aneurysm.  Mild atherosclerotic plaque of the aorta and its branches. No abdominal, pelvic, or inguinal lymphadenopathy. Reproductive: Prostate is unremarkable. Other: No intraperitoneal free fluid. No intraperitoneal free gas. No organized fluid collection. Musculoskeletal: No abdominal wall hernia or abnormality No suspicious lytic or blastic osseous lesions. No acute displaced fracture. Multilevel degenerative changes of the spine. IMPRESSION: 1. Emphysematous cystitis. 2. Please note limited evaluation of the abdomen and pelvis due to noncontrast study. 3. Other imaging findings of potential clinical significance: Fibrosis and emphysematous changes of the lungs. Aortic Atherosclerosis (ICD10-I70.0). Electronically Signed   By: Iven Finn M.D.   On: 09/09/2020 03:09   CT HEAD WO CONTRAST  Result Date: 09/09/2020 CLINICAL DATA:  Neurological deficit.  Acute stroke suspected. EXAM: CT HEAD WITHOUT CONTRAST TECHNIQUE: Contiguous axial images were obtained from the base of the skull through the vertex without intravenous contrast. COMPARISON:  06/01/2014 FINDINGS: Brain: Mild to moderate generalized brain volume loss. Numerous areas of low-density in the hemispheric white matter and basal ganglia consistent with chronic small vessel disease. No sign of acute infarction, mass lesion, hemorrhage, hydrocephalus or extra-axial collection. Vascular: There is atherosclerotic calcification of the major vessels at the base of the brain. Skull: Negative Sinuses/Orbits: Clear/normal Other: None IMPRESSION: No acute finding by CT. Atrophy and chronic small-vessel ischemic changes of the white matter and basal ganglia. Electronically Signed   By: Nelson Chimes M.D.   On: 09/09/2020 00:55   CT CHEST WO CONTRAST  Result Date: 09/09/2020 CLINICAL DATA:  COVID positive. Generalized weakness. Retrosternal chest pain. EXAM: CT CHEST WITHOUT CONTRAST TECHNIQUE: Multidetector CT imaging of the chest was performed following the standard  protocol without IV contrast. COMPARISON:  Chest radiograph from one day prior. FINDINGS: Cardiovascular: Top-normal heart size. Single lead left subclavian ICD with lead tip in the right ventricular apex. No significant pericardial effusion/thickening. Three-vessel coronary atherosclerosis status post CABG. Atherosclerotic nonaneurysmal thoracic aorta. Normal caliber pulmonary arteries. Mediastinum/Nodes: No discrete thyroid nodules. Unremarkable esophagus. No pathologically enlarged axillary, mediastinal or hilar lymph nodes, noting limited sensitivity for the detection of hilar adenopathy on this noncontrast study. Lungs/Pleura: No pneumothorax. No pleural effusion. Mild centrilobular emphysema. Solid 5 mm right middle lobe pulmonary nodule along the minor fissure (series 5/image 75). No acute consolidative airspace disease, lung masses or additional significant pulmonary nodules. Extensive patchy confluent peripheral reticulation and ground-glass opacity throughout both lungs with associated moderate traction bronchiectasis, architectural distortion and volume loss. There is a basilar predominance to these findings. No frank honeycombing. Upper abdomen: Cholelithiasis. Musculoskeletal: No aggressive appearing focal osseous lesions. Intact sternotomy wires. Marked thoracic spondylosis. IMPRESSION: 1. 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 Suggest  outpatient pulmonology consultation and follow-up high-resolution chest CT study in 6-12 months. Findings are categorized as probable UIP per consensus guidelines: Diagnosis of Idiopathic Pulmonary Fibrosis: An Official ATS/ERS/JRS/ALAT Clinical Practice Guideline. [place], Iss 5, [phone], Mar 29 2017. 2. Solid 5 mm right middle lobe pulmonary nodule along the minor fissure. Follow-up noncontrast chest CT recommended in 12 months in this high risk patient. This  recommendation follows the consensus statement: Guidelines for Management of Incidental Pulmonary Nodules Detected on CT Images: From the Fleischner Society 2017; Radiology 2017; 284:228-243. 3. Cholelithiasis. 4. Aortic Atherosclerosis (ICD10-I70.0) and Emphysema (ICD10-J43.9). Electronically Signed   By: Ilona Sorrel M.D.   On: 09/09/2020 12:08   DG Chest Portable 1 View  Result Date: 09/08/2020 CLINICAL DATA:  Chest pain. EXAM: PORTABLE CHEST 1 VIEW COMPARISON:  September 12, 2010 FINDINGS: A multi lead AICD is noted. Multiple sternal wires are seen. The heart size and mediastinal contours are within normal limits. A radiopaque loop recorder device is seen. There is no evidence of acute infiltrate, pleural effusion or pneumothorax. Degenerative changes are seen throughout the thoracic spine. IMPRESSION: Stable exam without acute or active cardiopulmonary disease. Electronically Signed   By: Virgina Norfolk M.D.   On: 09/08/2020 18:50   VAS [place] LOWER EXTREMITY VENOUS (DVT) (ONLY [facility] & [facility])  Result Date: 09/09/2020  Lower Venous DVT Study Indications: Covid-19.  Limitations: Shadowing from arterial plaque, clothing. Comparison Study: No prior study Performing Technologist: Sharion Dove RVS  Examination Guidelines: A complete evaluation includes B-mode imaging, spectral Doppler, color Doppler, and power Doppler as needed of all accessible portions of each vessel. Bilateral testing is considered an integral part of a complete examination. Limited examinations for reoccurring indications may be performed as noted. The reflux portion of the exam is performed with the patient in reverse Trendelenburg.  +---------+---------------+---------+-----------+----------+-------------------+ RIGHT    CompressibilityPhasicitySpontaneityPropertiesThrombus Aging      +---------+---------------+---------+-----------+----------+-------------------+ CFV                                                   Not well  visualized +---------+---------------+---------+-----------+----------+-------------------+ SFJ                                                   Not well visualized +---------+---------------+---------+-----------+----------+-------------------+ FV Prox  Full           Yes      Yes                                      +---------+---------------+---------+-----------+----------+-------------------+ FV Mid   Full                                                             +---------+---------------+---------+-----------+----------+-------------------+ FV DistalFull                                                             +---------+---------------+---------+-----------+----------+-------------------+  PFV                                                   Not well visualized +---------+---------------+---------+-----------+----------+-------------------+ POP      Partial        Yes      Yes                  Acute               +---------+---------------+---------+-----------+----------+-------------------+ PTV      Full                                                             +---------+---------------+---------+-----------+----------+-------------------+ PERO     None                                         Acute               +---------+---------------+---------+-----------+----------+-------------------+ Gastroc  None                                         Acute               +---------+---------------+---------+-----------+----------+-------------------+   +---------+---------------+---------+-----------+----------+-------------------+ LEFT     CompressibilityPhasicitySpontaneityPropertiesThrombus Aging      +---------+---------------+---------+-----------+----------+-------------------+ CFV                                                   Not well visualized  +---------+---------------+---------+-----------+----------+-------------------+ SFJ                                                   Not well visualized +---------+---------------+---------+-----------+----------+-------------------+ FV Prox  Full           Yes      Yes                                      +---------+---------------+---------+-----------+----------+-------------------+ FV Mid   Full                                                             +---------+---------------+---------+-----------+----------+-------------------+ FV DistalFull           Yes      Yes                                      +---------+---------------+---------+-----------+----------+-------------------+  PFV                                                   Not well visualized +---------+---------------+---------+-----------+----------+-------------------+ POP      Full           Yes      Yes                                      +---------+---------------+---------+-----------+----------+-------------------+ PTV      Full                                                             +---------+---------------+---------+-----------+----------+-------------------+ PERO     Full                                                             +---------+---------------+---------+-----------+----------+-------------------+    Summary: RIGHT: - Findings consistent with acute deep vein thrombosis involving the right popliteal vein, right peroneal veins, and right gastrocnemius veins. - There is no evidence of deep vein thrombosis in the lower extremity. - There is no evidence of superficial venous thrombosis.  LEFT: - No evidence of common femoral vein obstruction.  *See table(s) above for measurements and observations. Electronically signed by Harold Barban MD on 09/09/2020 at 7:57:19 PM.    Final    ECHOCARDIOGRAM LIMITED  Result Date: 08/16/2020    ECHOCARDIOGRAM LIMITED REPORT    Patient Name:   BERKELEY VANAKEN [facility]  Date of Exam: 08/16/2020 Medical Rec #:  [id number]     Height:       66.0 in Accession #:    [id number]    Weight:       176.8 lb Date of Birth:  08-03-1955    BSA:          1.898 m Patient Age:    46 years      BP:           111/67 mmHg Patient Gender: M             HR:           71 bpm. Exam Location:  [facility] Procedure: Limited Echo, Limited Color Doppler, Cardiac Doppler and Intracardiac            Opacification Agent Indications:    I50.9 CHF  History:        Patient has prior history of Echocardiogram examinations, most                 recent 07/06/2020. CHF, Previous Myocardial Infarction and CAD,                 Prior CABG, Stroke; Risk Factors:Hypertension, HLD, Diabetes and                 Dyslipidemia.  Sonographer:    Marygrace Drought RCS Referring Phys: [id number] [facility] A  CHANDRASEKHAR IMPRESSIONS  1. Left ventricular ejection fraction, by estimation, is 50 to 55%. The left ventricle has low normal function. The left ventricle has no regional wall motion abnormalities.  2. Right ventricular systolic function is normal. The right ventricular size is normal. There is normal pulmonary artery systolic pressure.  3. Left atrial size was mildly dilated.  4. The pericardial effusion is posterior and lateral to the left ventricle.  5. The mitral valve is normal in structure. No evidence of mitral valve regurgitation. No evidence of mitral stenosis.  6. The aortic valve is normal in structure. Aortic valve regurgitation is not visualized. No aortic stenosis is present.  7. The inferior vena cava is normal in size with greater than 50% respiratory variability, suggesting right atrial pressure of 3 mmHg. FINDINGS  Left Ventricle: Left ventricular ejection fraction, by estimation, is 50 to 55%. The left ventricle has low normal function. The left ventricle has no regional wall motion abnormalities. The left ventricular internal cavity size was normal in size. There is no left  ventricular hypertrophy. Right Ventricle: The right ventricular size is normal. No increase in right ventricular wall thickness. Right ventricular systolic function is normal. There is normal pulmonary artery systolic pressure. The tricuspid regurgitant velocity is 2.13 m/s, and  with an assumed right atrial pressure of 3 mmHg, the estimated right ventricular systolic pressure is 06.0 mmHg. Left Atrium: Left atrial size was mildly dilated. Right Atrium: Right atrial size was normal in size. Pericardium: Trivial pericardial effusion is present. The pericardial effusion is posterior and lateral to the left ventricle. Mitral Valve: The mitral valve is normal in structure. There is mild thickening of the mitral valve leaflet(s). There is mild calcification of the mitral valve leaflet(s). Mild mitral annular calcification. No evidence of mitral valve stenosis. Tricuspid Valve: The tricuspid valve is normal in structure. Tricuspid valve regurgitation is not demonstrated. No evidence of tricuspid stenosis. Aortic Valve: The aortic valve is normal in structure. Aortic valve regurgitation is not visualized. No aortic stenosis is present. Pulmonic Valve: The pulmonic valve was normal in structure. Pulmonic valve regurgitation is not visualized. No evidence of pulmonic stenosis. Aorta: The aortic root is normal in size and structure. Venous: The inferior vena cava is normal in size with greater than 50% respiratory variability, suggesting right atrial pressure of 3 mmHg. IAS/Shunts: No atrial level shunt detected by color flow Doppler. Additional Comments: A pacer wire is visualized. LEFT VENTRICLE PLAX 2D LVIDd:         4.10 cm LVIDs:         2.90 cm LV PW:         1.40 cm LV IVS:        0.80 cm LVOT diam:     2.20 cm LVOT Area:     3.80 cm  RIGHT VENTRICLE RVSP:           21.1 mmHg LEFT ATRIUM         Index      RIGHT ATRIUM LA diam:    3.90 cm 2.06 cm/m RA Pressure: 3.00 mmHg   AORTA Ao Root diam: 3.40 cm Ao Asc diam:   2.80 cm MITRAL VALVE                TRICUSPID VALVE MV Area (PHT):              TR Peak grad:   18.1 mmHg MV Decel Time:  TR Vmax:        213.00 cm/s MV E velocity: 85.70 cm/s   Estimated RAP:  3.00 mmHg MV A velocity: 109.00 cm/s  RVSP:           21.1 mmHg MV E/A ratio:  0.79                             SHUNTS                             Systemic Diam: 2.20 cm Jenkins Rouge MD Electronically signed by Jenkins Rouge MD Signature Date/Time: 08/16/2020/9:55:39 AM    Final

## 2020-09-10 NOTE — Progress Notes (Signed)
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 He also has a [employer] loop recorder. Dr. Candiss Norse aware about device safety and inability to proceed with study.

## 2020-09-10 NOTE — Plan of Care (Signed)

## 2020-09-10 NOTE — Progress Notes (Addendum)
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  Reports that a week ago he had some numbness in his right leg that progressed to weakness.  For the last few days he has been able to walk around with the help of a cart that he wheels around at work.  Today the weakness became more profound and he was unable to even stand up.  He has also had intermittent chest pain and pressure over the last week.  He used nitroglycerin when he had chest pain and chest pain would resolve.  States he last had chest pain approximately 24 hours ago.  He reports he is also had unintended weight loss over the last few months of approximately 60 pounds.  He also reports that he has been having difficulty with urination with a weak stream and occasional intermittent dysuria.  He has been referred to gastroenterology for further work-up of his unintended weight loss.  Dates he has not had any fever or chills.  He denies any cough.  He has had no known exposure to Covid. Lives with his fiance.  He denies tobacco, alcohol use  To CCM  - has incidental covid on admit. Not on O2. Says last 24-48h unable to stand but can move his legs. He built a ramp to walk in his house x fe wmonths. Increased dyspnea x few weeks. 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   Appears to have NSTEMI with high CK and trop. On antiocagulation   ILD QUESTIONNAIRE      [place]  Integrated Comprehensive ILD Questionnaire  Symptoms:   - dysnea x 14 days. Episodiec. Associateied leg weakness +. Denies cough  SYMPTOM SCALE - ILD 09/10/2020   O2 use ra  Shortness of Breath 0 -> 5 scale with 5 being worst (score 6 If unable to do)  At rest 0  Simple tasks - showers, clothes change, eating, shaving 0  Household (dishes, doing bed, laundry) 0  Shopping 1  Walking level at own pace 1  Walking up Stairs 2  Total (30-36) Dyspnea Score 4  How bad is your cough? 0  How bad is your fatigue 2  How bad is nausea 1  How bad is vomiting?  0  How bad is diarrhea? 0  How bad is anxiety? 0  How bad is depression 0        Past Medical History :     has a past medical history of Abnormal nuclear stress test (02/19/2018), Angina, class III ([place]) (02/19/2018), Anxiety, Asthma, CHF (congestive heart failure) ([place]), Coronary artery disease, DDD (degenerative disc disease), cervical (03/01/2013), Diabetes mellitus, Diverticular disease (01/12/14), Dyslipidemia, Hypertension, Lumbosacral radiculopathy at L4 (02/18/2017), Myocardial infarction ([place]) (2012), Obesity, Sleep apnea (08/17/2013), Snoring (10/12/2013), TIA (transient ischemic attack) (08/09/2014), Tubular adenoma of colon (01/12/2014), VF (ventricular fibrillation) ([place]), and Vitamin D deficiency (02/18/2017).  Denies ashtma, copd, collagen vasc disease,  vasculitis, pleuris   Currently has incidental covdi  ROS:  Does report color change in fingers  ? RAYNAID, 50# weight loss + Fatigue +   FAMILY HISTORY of LUNG DISEASE:  denies   EXPOSURE HISTORY:  negative   HOME and HOBBY DETAILS :  [facility] family home for 10 years. Age of home 24 years. Otherwise all negative   OCCUPATIONAL HISTORY (122 questions) :  negaive   PULMONARY TOXICITY HISTORY (27 items): negative   LABS CK profile  - Results for Eugene, [name] (MRN [id number]) as of 09/10/2020 18:54  Ref. Range 09/06/2010 00:00 09/06/2010 14:55 09/07/2010 08:00 09/08/2010  03:30 09/08/2020 21:21 09/09/2020 04:09 09/09/2020 16:43 09/10/2020 07:21  CK Total Latest Ref Range: 49 - 397 U/L 279 (H) 86 267 (H) 136 8,423 (H) 6,921 (H) 3,499 (H) 5,937 (H)   Results for Eugene, [name] (MRN [id number]) as of 09/10/2020 18:54  Ref. Range 09/08/2020 18:23 09/08/2020 21:21 09/09/2020 04:09  Troponin I (High Sensitivity) Latest Ref Range: <18 ng/L 10,598 [facility]) 13,971 (HH) 6,373 (HH)     Significant Hospital Events:  09/08/2020 - admit   Consults:  *211 - cards   Procedures:  x  Significant Diagnostic Tests:  x  Micro Data:  2/12 - covid positive  Antimicrobials:  x   Interim History / Subjective:   09/10/2020 - no change. On RA. Still unable to move lowers well  Objective   Blood pressure 112/67, pulse 79, temperature (!) 97.5 F (36.4 C), temperature source Oral, resp. rate (!) 23, height _0  (1.676 m), weight 73.9 kg, SpO2 98 %.        Intake/Output Summary (Last 24 hours) at 09/10/2020 1854 Last data filed at 09/10/2020 1049 Gross per 24 hour  Intake --  Output 1 ml  Net -1 ml   Filed Weights   09/08/20 1808  Weight: 73.9 kg    Examination - below from prior day General: obese HENT: no elevated jvp or nodes Lungs: Unable to ausculatate posteriorly due to his inability to hoist himself.Anterior no crackles. No distress Cardiovascular: Normal heart sounds Abdomen: visceral obesity Extremities: intact Neuro: axox3. Able to flex knee but unable to hoist GU: not examined  STIGMATA of CONNECTIVE TISSUE DISEASE  - Distal digital fissuring (ie, "mechanic hands") - no - Distal digital tip ulceration - no -Inflammatory arthritis or polyarticular morning joint stiffness ?60 minutes - no - Palmar telangiectasia - no - Raynaud phenomenon - no - Unexplained digital edema - no - Unexplained fixed rash on the digital extensor surfaces (Gottron's sign) - no ... 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    - other ddx is myositis related ILD (connective tissue ILD) given high CK that is not trackign with tropinin     Plan #TESTS   -  Await from hospital 09/10/20->  ESR, ACE, ANA, DS-DNA, RF, anti-CCP,  ANCA screen, MPO, PR-3, Total CK,  Aldolase,  ENP Panel ( ensure includes -> scl-70, ssA, ssB, anti-RNP, anti-JO-1, & Hypersensitivity Pneumonitis Panel   - check Ach[facility]ab 09/11/20  - call lab 09/11/20 Monday and ask to send out as outpatient , Myositis Panel (,Anti-PL-7, Anti-PL-12, Anti-EJ, Anti-OJ, Anti-KS, Anti-Zo, Anti-Ha-YRS, and Anti-SRP).  ->  test code [phone]  At [facility]  -muscle biopsy as inpatient (d/w Dr Georganna Skeans) if not risky (if Abs are non diagnostic bu CK is high and no etiology for weakness - bx strongly indicated keeping Plavix in mind) #RX  - if antibodies negative/muscule bippsy              ->  negative-> then opd esbriet/ofev wiith bias towards Esbriet given MI (dx is IPF)            -> positive - > then needs immune modulator (will check QuantGold, TPMT for immuran, and G6PD for bactrim proph 09/11/20 in anticipation)     - needs followup with Dr Chase Caller at ILD center next few weeks    > 40 min of this visit spent in face to face discussion and care coordination   Best practice (evaluated daily)  Per triad  Goals of Care:  Per triad   ATTESTATION & SIGNATURE   Dr. Brand Males, M.D., [facility].C.P Pulmonary and Critical Care Medicine Staff Physician [place] [facility] Pager: [phone], If no answer or between  15:00h - 7:00h: call [phone]  09/10/2020 6:54 PM    LABS    PULMONARY No results for input(s): PHART, PCO2ART, PO2ART, HCO3, TCO2, O2SAT in the last 168 hours.  Invalid input(s): PCO2, PO2  CBC Recent  Labs  Lab 09/08/20 1823 09/09/20 0409 09/10/20 0142  HGB 13.0 12.3* 11.5*  HCT 39.9 37.8* 33.4*  WBC 13.2* 12.2* 12.1*  PLT 177 168 174    COAGULATION No results for input(s): INR in the last 168 hours.  CARDIAC  No results for input(s): TROPONINI in the last 168 hours. No results for input(s): PROBNP in the last 168 hours.   CHEMISTRY Recent Labs  Lab 09/06/20 0720 09/08/20 1823 09/09/20 0409 09/09/20 1643 09/10/20 0721  NA 135 137 136  --  140  K 4.2 3.5 2.8*  --  3.3*  CL 95* 102 103  --  106  CO2 18* 19* 17*  --  23  GLUCOSE 274* 134* 89  --  60*  BUN 41* 55* 51*  --  42*  CREATININE 2.74* 2.80* 2.56*  --  2.15*  CALCIUM 9.4 9.2 9.0  --  8.9  MG  --   --   --  1.8 2.6*   Estimated Creatinine Clearance: 30.9 mL/min (A) (by C-G formula based on SCr of 2.15 mg/dL (H)).   LIVER Recent Labs  Lab 09/08/20 1823 09/09/20 0409  AST 208* 182*  ALT 173* 154*  ALKPHOS 58 46  BILITOT 1.6* 1.3*  PROT 7.0 6.1*  ALBUMIN 3.1* 2.8*     INFECTIOUS Recent Labs  Lab 09/09/20 1643  PROCALCITON 0.10     ENDOCRINE CBG (last 3)  Recent Labs    09/10/20 1003 09/10/20 1233 09/10/20 1758  GLUCAP 88 114* 161*         IMAGING x48h  - image(s) personally visualized  -   highlighted in bold CT ABDOMEN PELVIS WO CONTRAST  Result Date: 09/09/2020 CLINICAL DATA:  Weight loss, and intended. EXAM: CT ABDOMEN AND PELVIS WITHOUT CONTRAST TECHNIQUE: Multidetector CT imaging of the abdomen and pelvis was performed following the standard protocol without IV contrast. COMPARISON:  None. FINDINGS: Lower chest: Coronary artery calcifications. Partially visualized cardiac defibrillator. Diffuse reticulations of the visualized lung bases as well as suggestion of emphysematous changes. Possible tiny hiatal hernia. Hepatobiliary: No focal liver abnormality. Several  calcified gallstones within the gallbladder lumen. No gallbladder wall thickening or pericholecystic fluid. No biliary  dilatation. Pancreas: No focal lesion. Normal pancreatic contour. No surrounding inflammatory changes. No main pancreatic ductal dilatation. Spleen: Normal in size without focal abnormality. Suggestion of a splenule (3:33). Adrenals/Urinary Tract: No adrenal nodule bilaterally. No nephrolithiasis, no hydronephrosis, and no contour-deforming renal mass. No ureterolithiasis or hydroureter. Urinary bladder wall emphysema. Stomach/Bowel: PO contrast reaches the mid small bowel. Stomach is within normal limits. No evidence of bowel wall thickening or dilatation. Appendix appears normal. Vascular/Lymphatic: No abdominal aorta or iliac aneurysm. Mild atherosclerotic plaque of the aorta and its branches. No abdominal, pelvic, or inguinal lymphadenopathy. Reproductive: Prostate is unremarkable. Other: No intraperitoneal free fluid. No intraperitoneal free gas. No organized fluid collection. Musculoskeletal: No abdominal wall hernia or abnormality No suspicious lytic or blastic osseous lesions. No acute displaced fracture. Multilevel degenerative changes of the spine. IMPRESSION: 1. Emphysematous cystitis. 2. Please note limited evaluation of the abdomen and pelvis due to noncontrast study. 3. Other imaging findings of potential clinical significance: Fibrosis and emphysematous changes of the lungs. Aortic Atherosclerosis (ICD10-I70.0). Electronically Signed   By: Iven Finn M.D.   On: 09/09/2020 03:09   CT HEAD WO CONTRAST  Result Date: 09/09/2020 CLINICAL DATA:  Neurological deficit.  Acute stroke suspected. EXAM: CT HEAD WITHOUT CONTRAST TECHNIQUE: Contiguous axial images were obtained from the base of the skull through the vertex without intravenous contrast. COMPARISON:  06/01/2014 FINDINGS: Brain: Mild to moderate generalized brain volume loss. Numerous areas of low-density in the hemispheric white matter and basal ganglia consistent with chronic small vessel disease. No sign of acute infarction, mass lesion,  hemorrhage, hydrocephalus or extra-axial collection. Vascular: There is atherosclerotic calcification of the major vessels at the base of the brain. Skull: Negative Sinuses/Orbits: Clear/normal Other: None IMPRESSION: No acute finding by CT. Atrophy and chronic small-vessel ischemic changes of the white matter and basal ganglia. Electronically Signed   By: Nelson Chimes M.D.   On: 09/09/2020 00:55   CT CHEST WO CONTRAST  Result Date: 09/09/2020 CLINICAL DATA:  COVID positive. Generalized weakness. Retrosternal chest pain. EXAM: CT CHEST WITHOUT CONTRAST TECHNIQUE: Multidetector CT imaging of the chest was performed following the standard protocol without IV contrast. COMPARISON:  Chest radiograph from one day prior. FINDINGS: Cardiovascular: Top-normal heart size. Single lead left subclavian ICD with lead tip in the right ventricular apex. No significant pericardial effusion/thickening. Three-vessel coronary atherosclerosis status post CABG. Atherosclerotic nonaneurysmal thoracic aorta. Normal caliber pulmonary arteries. Mediastinum/Nodes: No discrete thyroid nodules. Unremarkable esophagus. No pathologically enlarged axillary, mediastinal or hilar lymph nodes, noting limited sensitivity for the detection of hilar adenopathy on this noncontrast study. Lungs/Pleura: No pneumothorax. No pleural effusion. Mild centrilobular emphysema. Solid 5 mm right middle lobe pulmonary nodule along the minor fissure (series 5/image 75). No acute consolidative airspace disease, lung masses or additional significant pulmonary nodules. Extensive patchy confluent peripheral reticulation and ground-glass opacity throughout both lungs with associated moderate traction bronchiectasis, architectural distortion and volume loss. There is a basilar predominance to these findings. No frank honeycombing. Upper abdomen: Cholelithiasis. Musculoskeletal: No aggressive appearing focal osseous lesions. Intact sternotomy wires. Marked thoracic  spondylosis. IMPRESSION: 1. 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 Suggest outpatient pulmonology consultation and follow-up high-resolution chest CT study in 6-12 months. Findings are categorized as probable UIP per consensus guidelines: Diagnosis of Idiopathic Pulmonary Fibrosis: An Official ATS/ERS/JRS/ALAT Clinical Practice Guideline. [place],  Iss 5, [id number], Mar 29 2017. 2. Solid 5 mm right middle lobe pulmonary nodule along the minor fissure. Follow-up noncontrast chest CT recommended in 12 months in this high risk patient. This recommendation follows the consensus statement: Guidelines for Management of Incidental Pulmonary Nodules Detected on CT Images: From the Fleischner Society 2017; Radiology 2017; 284:228-243. 3. Cholelithiasis. 4. Aortic Atherosclerosis (ICD10-I70.0) and Emphysema (ICD10-J43.9). Electronically Signed   By: Ilona Sorrel M.D.   On: 09/09/2020 12:08   VAS [place] LOWER EXTREMITY VENOUS (DVT) (ONLY [facility] & [facility])  Result Date: 09/09/2020  Lower Venous DVT Study Indications: Covid-19.  Limitations: Shadowing from arterial plaque, clothing. Comparison Study: No prior study Performing Technologist: Sharion Dove RVS  Examination Guidelines: A complete evaluation includes B-mode imaging, spectral Doppler, color Doppler, and power Doppler as needed of all accessible portions of each vessel. Bilateral testing is considered an integral part of a complete examination. Limited examinations for reoccurring indications may be performed as noted. The reflux portion of the exam is performed with the patient in reverse Trendelenburg.  +---------+---------------+---------+-----------+----------+-------------------+ RIGHT    CompressibilityPhasicitySpontaneityPropertiesThrombus Aging      +---------+---------------+---------+-----------+----------+-------------------+ CFV                                                    Not well visualized +---------+---------------+---------+-----------+----------+-------------------+ SFJ                                                   Not well visualized +---------+---------------+---------+-----------+----------+-------------------+ FV Prox  Full           Yes      Yes                                      +---------+---------------+---------+-----------+----------+-------------------+ FV Mid   Full                                                             +---------+---------------+---------+-----------+----------+-------------------+ FV DistalFull                                                             +---------+---------------+---------+-----------+----------+-------------------+ PFV                                                   Not well visualized +---------+---------------+---------+-----------+----------+-------------------+ POP      Partial        Yes      Yes                  Acute               +---------+---------------+---------+-----------+----------+-------------------+  PTV      Full                                                             +---------+---------------+---------+-----------+----------+-------------------+ PERO     None                                         Acute               +---------+---------------+---------+-----------+----------+-------------------+ Gastroc  None                                         Acute               +---------+---------------+---------+-----------+----------+-------------------+   +---------+---------------+---------+-----------+----------+-------------------+ LEFT     CompressibilityPhasicitySpontaneityPropertiesThrombus Aging      +---------+---------------+---------+-----------+----------+-------------------+ CFV                                                   Not well visualized  +---------+---------------+---------+-----------+----------+-------------------+ SFJ                                                   Not well visualized +---------+---------------+---------+-----------+----------+-------------------+ FV Prox  Full           Yes      Yes                                      +---------+---------------+---------+-----------+----------+-------------------+ FV Mid   Full                                                             +---------+---------------+---------+-----------+----------+-------------------+ FV DistalFull           Yes      Yes                                      +---------+---------------+---------+-----------+----------+-------------------+ PFV                                                   Not well visualized +---------+---------------+---------+-----------+----------+-------------------+ POP      Full           Yes      Yes                                      +---------+---------------+---------+-----------+----------+-------------------+  PTV      Full                                                             +---------+---------------+---------+-----------+----------+-------------------+ PERO     Full                                                             +---------+---------------+---------+-----------+----------+-------------------+    Summary: RIGHT: - Findings consistent with acute deep vein thrombosis involving the right popliteal vein, right peroneal veins, and right gastrocnemius veins. - There is no evidence of deep vein thrombosis in the lower extremity. - There is no evidence of superficial venous thrombosis.  LEFT: - No evidence of common femoral vein obstruction.  *See table(s) above for measurements and observations. Electronically signed by Harold Barban MD on 09/09/2020 at 7:57:19 PM.    Final    ECHOCARDIOGRAM LIMITED  Result Date: 09/10/2020    ECHOCARDIOGRAM LIMITED REPORT    Patient Name:   Eugene [name] [employer] Date of Exam: 09/09/2020 Medical Rec #:  [id number]    Height:       66.0 in Accession #:    [id number]   Weight:       163.0 lb Date of Birth:  10/15/1955   BSA:          1.833 m Patient Age:    47 years     BP:           99/66 mmHg Patient Gender: M            HR:           81 bpm. Exam Location:  Inpatient Procedure: Limited Echo, Limited Color Doppler and Cardiac Doppler Indications:    NSTEMI  History:        Patient has prior history of Echocardiogram examinations, most                 recent 08/16/2020. Defibrillator and Prior CABG, Covid. chronic                 kidney disease; Risk Factors:Diabetes.  Sonographer:    Johny Chess Referring Phys: [id number] [place]  1. Left ventricular ejection fraction, by estimation, is 50 to 55%. The left ventricle has low normal function. The left ventricle has no regional wall motion abnormalities. Left ventricular diastolic parameters are consistent with Grade I diastolic dysfunction (impaired relaxation).  2. Right ventricular systolic function is normal.  3. The mitral valve is normal in structure. No evidence of mitral valve regurgitation.  4. The aortic valve is tricuspid. There is mild calcification of the aortic valve. There is mild thickening of the aortic valve. Aortic valve regurgitation is not visualized. No aortic stenosis is present. Comparison(s): A prior study was performed on 08/16/2020. No significant change from prior study. Prior images reviewed side by side. FINDINGS  Left Ventricle: Left ventricular ejection fraction, by estimation, is 50 to 55%. The left ventricle has low normal function. The left ventricle has no regional wall motion abnormalities. Abnormal (paradoxical) septal motion, consistent with RV pacemaker. Left ventricular diastolic  parameters are consistent with Grade I diastolic dysfunction (impaired relaxation). Right Ventricle: Right ventricular systolic function is normal.  Pericardium: Trivial pericardial effusion is present. Mitral Valve: The mitral valve is normal in structure. Tricuspid Valve: The tricuspid valve is grossly normal. Tricuspid valve regurgitation is not demonstrated. Aortic Valve: The aortic valve is tricuspid. There is mild calcification of the aortic valve. There is mild thickening of the aortic valve. Aortic valve regurgitation is not visualized. No aortic stenosis is present. Aorta: The aortic root and ascending aorta are structurally normal, with no evidence of dilitation. Additional Comments: A pacer wire is visualized. LEFT VENTRICLE PLAX 2D LVIDd:         4.60 cm     Diastology LVIDs:         3.70 cm     LV e' medial:    5.55 cm/s LV PW:         1.00 cm     LV E/e' medial:  11.9 LV IVS:        0.80 cm     LV e' lateral:   6.53 cm/s                            LV E/e' lateral: 10.1  LV Volumes (MOD) LV vol d, MOD A2C: 67.7 ml LV vol s, MOD A2C: 40.4 ml LV SV MOD A2C:     27.3 ml IVC IVC diam: 1.50 cm LEFT ATRIUM         Index LA diam:    3.80 cm 2.07 cm/m  AORTIC VALVE LVOT Vmax:   70.30 cm/s LVOT Vmean:  45.000 cm/s LVOT VTI:    0.126 m  AORTA Ao Asc diam: 2.90 cm MITRAL VALVE MV Area (PHT): 4.63 cm     SHUNTS MV Decel Time: 164 msec     Systemic VTI: 0.13 m MV E velocity: 66.00 cm/s MV A velocity: 105.00 cm/s MV E/A ratio:  0.63 Rudean Haskell MD Electronically signed by Rudean Haskell MD Signature Date/Time: 09/10/2020/12:10:15 PM    Final

## 2020-09-11 DIAGNOSIS — I214 Non-ST elevation (NSTEMI) myocardial infarction: Secondary | ICD-10-CM | POA: Diagnosis not present

## 2020-09-11 LAB — CK TOTAL AND CKMB (NOT AT ARMC)
CK, MB: 33.2 ng/mL — ABNORMAL HIGH (ref 0.5–5.0)
CK, MB: 32.2 ng/mL — ABNORMAL HIGH (ref 0.5–5.0)
Relative Index: 0.7 (ref 0.0–2.5)
Relative Index: 0.6 (ref 0.0–2.5)
Total CK: 5022 U/L — ABNORMAL HIGH (ref 49–397)
Total CK: 4981 U/L — ABNORMAL HIGH (ref 49–397)

## 2020-09-11 LAB — COMPREHENSIVE METABOLIC PANEL
ALT: 150 U/L — ABNORMAL HIGH (ref 0–44)
AST: 146 U/L — ABNORMAL HIGH (ref 15–41)
Albumin: 2.5 g/dL — ABNORMAL LOW (ref 3.5–5.0)
Alkaline Phosphatase: 54 U/L (ref 38–126)
Anion gap: 9 (ref 5–15)
BUN: 42 mg/dL — ABNORMAL HIGH (ref 8–23)
CO2: 20 mmol/L — ABNORMAL LOW (ref 22–32)
Calcium: 8.9 mg/dL (ref 8.9–10.3)
Chloride: 110 mmol/L (ref 98–111)
Creatinine, Ser: 2.28 mg/dL — ABNORMAL HIGH (ref 0.61–1.24)
GFR, Estimated: 31 mL/min — ABNORMAL LOW (ref >60)
Glucose, Bld: 169 mg/dL — ABNORMAL HIGH (ref 70–99)
Potassium: 4.3 mmol/L (ref 3.5–5.1)
Sodium: 139 mmol/L (ref 135–145)
Total Bilirubin: 0.9 mg/dL (ref 0.3–1.2)
Total Protein: 6.3 g/dL — ABNORMAL LOW (ref 6.5–8.1)

## 2020-09-11 LAB — GLUCOSE, CAPILLARY
Glucose-Capillary: 149 mg/dL — ABNORMAL HIGH (ref 70–99)
Glucose-Capillary: 147 mg/dL — ABNORMAL HIGH (ref 70–99)
Glucose-Capillary: 190 mg/dL — ABNORMAL HIGH (ref 70–99)
Glucose-Capillary: 149 mg/dL — ABNORMAL HIGH (ref 70–99)

## 2020-09-11 LAB — TROPONIN I (HIGH SENSITIVITY)
Troponin I (High Sensitivity): 2710 ng/L (ref <18)
Troponin I (High Sensitivity): 2496 ng/L (ref <18)
Troponin I (High Sensitivity): 1970 ng/L (ref <18)

## 2020-09-11 LAB — ANTI-DNA ANTIBODY, DOUBLE-STRANDED: ds DNA Ab: 55 IU/mL — ABNORMAL HIGH (ref 0–9)

## 2020-09-11 LAB — D-DIMER, QUANTITATIVE: D-Dimer, Quant: 0.75 ug/mL-FEU — ABNORMAL HIGH (ref 0.00–0.50)

## 2020-09-11 LAB — CK
Total CK: 4695 U/L — ABNORMAL HIGH (ref 49–397)
Total CK: 4843 U/L — ABNORMAL HIGH (ref 49–397)

## 2020-09-11 LAB — CBC
HCT: 36.6 % — ABNORMAL LOW (ref 39.0–52.0)
Hemoglobin: 11.7 g/dL — ABNORMAL LOW (ref 13.0–17.0)
MCH: 28.4 pg (ref 26.0–34.0)
MCHC: 32 g/dL (ref 30.0–36.0)
MCV: 88.8 fL (ref 80.0–100.0)
Platelets: 172 10*3/uL (ref 150–400)
RBC: 4.12 MIL/uL — ABNORMAL LOW (ref 4.22–5.81)
RDW: 14.8 % (ref 11.5–15.5)
WBC: 11.5 10*3/uL — ABNORMAL HIGH (ref 4.0–10.5)
nRBC: 0 % (ref 0.0–0.2)

## 2020-09-11 LAB — URINE CULTURE
Culture: >=100000 — AB
Special Requests: NORMAL

## 2020-09-11 LAB — MPO/PR-3 (ANCA) ANTIBODIES
ANCA Proteinase 3: <3.5 U/mL (ref 0.0–3.5)
Myeloperoxidase Abs: <9 U/mL (ref 0.0–9.0)

## 2020-09-11 LAB — HEPARIN LEVEL (UNFRACTIONATED)
Heparin Unfractionated: 0.24 IU/mL — ABNORMAL LOW (ref 0.30–0.70)
Heparin Unfractionated: 0.35 IU/mL (ref 0.30–0.70)

## 2020-09-11 LAB — SJOGRENS SYNDROME-A EXTRACTABLE NUCLEAR ANTIBODY: SSA (Ro) (ENA) Antibody, IgG: <0.2 AI (ref 0.0–0.9)

## 2020-09-11 LAB — BRAIN NATRIURETIC PEPTIDE: B Natriuretic Peptide: 556.4 pg/mL — ABNORMAL HIGH (ref 0.0–100.0)

## 2020-09-11 LAB — ANTI-JO 1 ANTIBODY, IGG: Anti JO-1: <0.2 AI (ref 0.0–0.9)

## 2020-09-11 LAB — C-REACTIVE PROTEIN: CRP: 2.3 mg/dL — ABNORMAL HIGH (ref <1.0)

## 2020-09-11 LAB — ANTI-SMITH ANTIBODY: ENA SM Ab Ser-aCnc: <0.2 AI (ref 0.0–0.9)

## 2020-09-11 LAB — RHEUMATOID FACTOR: Rheumatoid fact SerPl-aCnc: <10 IU/mL (ref <14.0)

## 2020-09-11 LAB — ALDOLASE: Aldolase: 41.1 U/L — ABNORMAL HIGH (ref 3.3–10.3)

## 2020-09-11 LAB — MAGNESIUM: Magnesium: 2.4 mg/dL (ref 1.7–2.4)

## 2020-09-11 LAB — SJOGRENS SYNDROME-B EXTRACTABLE NUCLEAR ANTIBODY: SSB (La) (ENA) Antibody, IgG: <0.2 AI (ref 0.0–0.9)

## 2020-09-11 LAB — ANTI-SCLERODERMA ANTIBODY: Scleroderma (Scl-70) (ENA) Antibody, IgG: <0.2 AI (ref 0.0–0.9)

## 2020-09-11 LAB — CYCLIC CITRUL PEPTIDE ANTIBODY, IGG/IGA: CCP Antibodies IgG/IgA: 3 units (ref 0–19)

## 2020-09-11 MED ORDER — CYANOCOBALAMIN 1000 MCG/ML IJ SOLN
1000.0000 ug | Freq: Every day | INTRAMUSCULAR | Status: DC
  Administered 2020-09-11 – 2020-09-16 (×6): 1000 ug via INTRAMUSCULAR
  Filled 2020-09-11 (×6): qty 1

## 2020-09-11 MED ORDER — ADULT MULTIVITAMIN W/MINERALS CH
1.0000 | ORAL_TABLET | Freq: Every day | ORAL | Status: DC
  Administered 2020-09-11 – 2020-09-16 (×6): 1 via ORAL
  Filled 2020-09-11 (×6): qty 1

## 2020-09-11 MED ORDER — ENSURE ENLIVE PO LIQD
237.0000 mL | Freq: Three times a day (TID) | ORAL | Status: DC
  Administered 2020-09-11 – 2020-09-15 (×9): 237 mL via ORAL
  Filled 2020-09-11: qty 237

## 2020-09-11 MED ORDER — SODIUM CHLORIDE 0.9% FLUSH
3.0000 mL | Freq: Two times a day (BID) | INTRAVENOUS | Status: DC
  Administered 2020-09-12: 3 mL via INTRAVENOUS

## 2020-09-11 MED ORDER — METOPROLOL SUCCINATE ER 25 MG PO TB24
25.0000 mg | ORAL_TABLET | Freq: Every day | ORAL | Status: DC
  Administered 2020-09-12 – 2020-09-13 (×2): 25 mg via ORAL
  Filled 2020-09-11 (×2): qty 1

## 2020-09-11 MED ORDER — LACTATED RINGERS IV SOLN: INTRAVENOUS | Status: AC

## 2020-09-11 NOTE — Progress Notes (Addendum)
Progress Note  Patient Name: Eugene [name] Date of Encounter: 09/11/2020  Primary Cardiologist: Cristopher Peru, MD   Subjective   Patient seen on 5 W. in Covid isolation room.  He is breathing comfortably on room air sitting in the bedside chair with no pulmonary concerns. Did have chest pain last night which was nitro responsive.  Last episode of chest pain prior to last night was 2 weeks ago for which she had also taken nitro with relief of chest pain.  He gets angina approximately 3 times a week and takes a nitroglycerin for relief. Significant complex disease on coronary angiography, most recent PCI 06/2020.  Inpatient Medications    Scheduled Meds: . aspirin EC  81 mg Oral Daily  . clopidogrel  75 mg Oral Daily  . cyanocobalamin  1,000 mcg Intramuscular Daily  . feeding supplement  237 mL Oral TID BM  . insulin aspart  0-5 Units Subcutaneous QHS  . insulin aspart  0-9 Units Subcutaneous TID WC  . insulin glargine  50 Units Subcutaneous Daily  . [START ON 09/12/2020] metoprolol succinate  25 mg Oral Daily  . multivitamin with minerals  1 tablet Oral Daily  . pantoprazole  40 mg Oral Q1200   Continuous Infusions: . cefTRIAXone (ROCEPHIN)  IV 2 g (09/11/20 1211)  . heparin 1,450 Units/hr (09/11/20 1532)  . lactated ringers 50 mL/hr at 09/11/20 0910   PRN Meds: acetaminophen, nitroGLYCERIN   Vital Signs    Vitals:   09/10/20 1504 09/10/20 2030 09/10/20 2039 09/10/20 2056  BP: 112/67 106/68 100/68 (!) 87/56  Pulse: 79  91 85  Resp: (!) 23 20 (!) 28 20  Temp: (!) 97.5 F (36.4 C) 98.7 F (37.1 C)  98.7 F (37.1 C)  TempSrc: Oral Oral  Oral  SpO2: 98% 97% 97%   Weight:      Height:        Intake/Output Summary (Last 24 hours) at 09/11/2020 1548 Last data filed at 09/11/2020 1532 Gross per 24 hour  Intake 2146.89 ml  Output 5000 ml  Net -2853.11 ml   Filed Weights   09/08/20 1808  Weight: 73.9 kg    Telemetry    Sinus rhythm- Personally Reviewed  ECG     No new- Personally Reviewed  Physical Exam   GEN: No acute distress.   Neck: No JVD Cardiac: regular rhythm, normal rate, no murmurs, rubs, or gallops.  Respiratory: Clear to auscultation bilaterally. GI: Soft, nontender, non-distended  MS: No edema; No deformity. Neuro:  Nonfocal  Psych: Normal affect   Labs    Chemistry Recent Labs  Lab 09/06/20 0720 09/06/20 0720 09/08/20 1823 09/09/20 0409 09/10/20 0721 09/11/20 0439  NA 135   < > 137 136 140 139  K 4.2  --  3.5 2.8* 3.3* 4.3  CL 95*  --  102 103 106 110  CO2 18*  --  19* 17* 23 20*  GLUCOSE 274*   < > 134* 89 60* 169*  BUN 41*   < > 55* 51* 42* 42*  CREATININE 2.74*  --  2.80* 2.56* 2.15* 2.28*  CALCIUM 9.4  --  9.2 9.0 8.9 8.9  PROT  --   --  7.0 6.1*  --  6.3*  ALBUMIN  --   --  3.1* 2.8*  --  2.5*  AST  --   --  208* 182*  --  146*  ALT  --   --  173* 154*  --  150*  ALKPHOS  --   --  58 46  --  54  BILITOT  --   --  1.6* 1.3*  --  0.9  GFRNONAA 23*   < > 24* 27* 33* 31*  GFRAA 27*  --   --   --   --   --   ANIONGAP  --    < > 16* 16* 11 9   < > = values in this interval not displayed.     Hematology Recent Labs  Lab 09/09/20 0409 09/10/20 0142 09/11/20 0439  WBC 12.2* 12.1* 11.5*  RBC 4.32 3.94* 4.12*  HGB 12.3* 11.5* 11.7*  HCT 37.8* 33.4* 36.6*  MCV 87.5 84.8 88.8  MCH 28.5 29.2 28.4  MCHC 32.5 34.4 32.0  RDW 14.4 14.6 14.8  PLT 168 174 172    Cardiac EnzymesNo results for input(s): TROPONINI in the last 168 hours. No results for input(s): TROPIPOC in the last 168 hours.   BNP Recent Labs  Lab 09/10/20 0721 09/11/20 0432  BNP 457.4* 556.4*     DDimer  Recent Labs  Lab 09/10/20 0721 09/11/20 0439  DDIMER 0.50 0.75*     Radiology    ECHOCARDIOGRAM LIMITED  Result Date: 09/10/2020    ECHOCARDIOGRAM LIMITED REPORT   Patient Name:   Eugene [name] [facility] Date of Exam: 09/09/2020 Medical Rec #:  [id number]    Height:       66.0 in Accession #:    [id number]   Weight:       163.0 lb Date  of Birth:  [birth year]   BSA:          1.833 m Patient Age:    4 years     BP:           99/66 mmHg Patient Gender: M            HR:           81 bpm. Exam Location:  Inpatient Procedure: Limited Echo, Limited Color Doppler and Cardiac Doppler Indications:    NSTEMI  History:        Patient has prior history of Echocardiogram examinations, most                 recent 08/16/2020. Defibrillator and Prior CABG, Covid. chronic                 kidney disease; Risk Factors:Diabetes.  Sonographer:    Johny Chess Referring Phys: [id number] [place]  1. Left ventricular ejection fraction, by estimation, is 50 to 55%. The left ventricle has low normal function. The left ventricle has no regional wall motion abnormalities. Left ventricular diastolic parameters are consistent with Grade I diastolic dysfunction (impaired relaxation).  2. Right ventricular systolic function is normal.  3. The mitral valve is normal in structure. No evidence of mitral valve regurgitation.  4. The aortic valve is tricuspid. There is mild calcification of the aortic valve. There is mild thickening of the aortic valve. Aortic valve regurgitation is not visualized. No aortic stenosis is present. Comparison(s): A prior study was performed on 08/16/2020. No significant change from prior study. Prior images reviewed side by side. FINDINGS  Left Ventricle: Left ventricular ejection fraction, by estimation, is 50 to 55%. The left ventricle has low normal function. The left ventricle has no regional wall motion abnormalities. Abnormal (paradoxical) septal motion, consistent with RV pacemaker. Left ventricular diastolic parameters are consistent with Grade I diastolic dysfunction (impaired relaxation). Right Ventricle: Right  ventricular systolic function is normal. Pericardium: Trivial pericardial effusion is present. Mitral Valve: The mitral valve is normal in structure. Tricuspid Valve: The tricuspid valve is grossly normal.  Tricuspid valve regurgitation is not demonstrated. Aortic Valve: The aortic valve is tricuspid. There is mild calcification of the aortic valve. There is mild thickening of the aortic valve. Aortic valve regurgitation is not visualized. No aortic stenosis is present. Aorta: The aortic root and ascending aorta are structurally normal, with no evidence of dilitation. Additional Comments: A pacer wire is visualized. LEFT VENTRICLE PLAX 2D LVIDd:         4.60 cm     Diastology LVIDs:         3.70 cm     LV e' medial:    5.55 cm/s LV PW:         1.00 cm     LV E/e' medial:  11.9 LV IVS:        0.80 cm     LV e' lateral:   6.53 cm/s                            LV E/e' lateral: 10.1  LV Volumes (MOD) LV vol d, MOD A2C: 67.7 ml LV vol s, MOD A2C: 40.4 ml LV SV MOD A2C:     27.3 ml IVC IVC diam: 1.50 cm LEFT ATRIUM         Index LA diam:    3.80 cm 2.07 cm/m  AORTIC VALVE LVOT Vmax:   70.30 cm/s LVOT Vmean:  45.000 cm/s LVOT VTI:    0.126 m  AORTA Ao Asc diam: 2.90 cm MITRAL VALVE MV Area (PHT): 4.63 cm     SHUNTS MV Decel Time: 164 msec     Systemic VTI: 0.13 m MV E velocity: 66.00 cm/s MV A velocity: 105.00 cm/s MV E/A ratio:  0.63 Rudean Haskell MD Electronically signed by Rudean Haskell MD Signature Date/Time: 09/10/2020/12:10:15 PM    Final     Cardiac Studies   Echo this admission shows stable EF  Patient Profile     65 y.o. male presents to the hospital with new muscle weakness and elevation of CK, also found to have significant troponin elevation peaking at 13,000, suggestive of NSTEMI.  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  Also has chronic kidney disease, diabetes mellitus, hypertension, hyperlipidemia, history of stroke.  There is report of an abdominal aortogram/runoff study from 2015 but I am unable to locate this in the record for independent review.  Presents with  bilateral worsening progressive lower extremity weakness that is bilateral, found to have an elevated CK and elevated troponin.  Assessment & Plan   Principal Problem:   NSTEMI (non-ST elevated myocardial infarction) [facility]) Active Problems:   Single implantable cardioverter-defibrillator (ICD) in situ   Coronary artery disease involving coronary bypass graft of native heart with angina pectoris (HCC)   Type 2 diabetes mellitus with vascular disease (HCC)   Rhabdomyolysis   CKD (chronic kidney disease) stage 4, GFR 15-29 ml/min (HCC)   Unintended weight loss   Weakness of both legs   Elevated LFTs   Prolonged QT interval   Weak urine stream   Weakness   ILD (interstitial lung disease) ([place])   NSTEMI-troponin elevation out of proportion to the degree of chronic kidney disease and suggests  ACS.  The patient's last episode of chest pain was yesterday evening and prior to that 2 weeks ago.  He is also Covid positive.  He presented to the hospital with bilateral worsening leg weakness.  General surgery has deferred muscle biopsy due Covid positive status and NSTEMI.  I discussed with the patient that his chest pain may be able to be worked up as an outpatient given ongoing dual antiplatelet therapy, however evaluation for source of troponin elevation may be easiest accomplished in the hospital and may facilitate planning for muscle biopsy as an outpatient.  The leg weakness is the patient's primary concern.  With this in mind we will plan for coronary angiography tomorrow.  Patient is being hydrated due to elevated CK and renal failure.  Reviewed his case with interventional cardiology, given ongoing chest pain, this is felt to be a reasonable approach.  Continue aspirin and Plavix.  Continue IV heparin through cath tomorrow.  Patient consented as noted below.  Patient is currently hypotensive but appears to be mentating clearly and is asymptomatic.  Continue beta-blocker as tolerated.  Appears well  compensated and does not appear to need diuretic today.  I have discussed this plan with Dr. Candiss Norse from internal medicine and we are in agreement on the plan.  INFORMED CONSENT: I have reviewed the risks, indications, and alternatives to cardiac catheterization, possible angioplasty, and stenting with the patient. Risks include but are not limited to bleeding, infection, vascular injury, stroke, myocardial infection, arrhythmia, kidney injury, radiation-related injury in the case of prolonged fluoroscopy use, emergency cardiac surgery, and death. The patient understands the risks of serious complication is 1-2 in 6734 with diagnostic cardiac cath and 1-2% or less with angioplasty/stenting.   Total time of encounter: 35 minutes total time of encounter, including 20 minutes spent in face-to-face patient care on the date of this encounter. This time includes coordination of care and counseling regarding above mentioned problem list. Remainder of non-face-to-face time involved reviewing chart documents/testing relevant to the patient encounter and documentation in the medical record.  Cherlynn Kaiser, MD [place]        For questions or updates, please contact [facility] Please consult [email or web address] for contact info under        Signed, Elouise Munroe, MD  09/11/2020, 3:48 PM

## 2020-09-11 NOTE — Progress Notes (Signed)
PCCM Progress note   Patient making slow progress  Please have patient follow up with [name] upon discharge   PCCM will sign off. Thank you for the opportunity to participate in this patient's care. Please contact if we can be of further assistance.  Johnsie Cancel, NP-C [facility] Personal contact information can be found on Amion  If no response please page: Adult pulmonary and critical care medicine pager on Amion unitl 7pm After 7pm please call [phone] 09/11/2020, 1:45 PM

## 2020-09-11 NOTE — H&P (View-Only) (Signed)
Progress Note  Patient Name: Eugene [name] Date of Encounter: 09/11/2020  Primary Cardiologist: Cristopher Peru, MD   Subjective   Patient seen on 5 W. in Covid isolation room.  He is breathing comfortably on room air sitting in the bedside chair with no pulmonary concerns. Did have chest pain last night which was nitro responsive.  Last episode of chest pain prior to last night was 2 weeks ago for which she had also taken nitro with relief of chest pain.  He gets angina approximately 3 times a week and takes a nitroglycerin for relief. Significant complex disease on coronary angiography, most recent PCI 06/2020.  Inpatient Medications    Scheduled Meds: . aspirin EC  81 mg Oral Daily  . clopidogrel  75 mg Oral Daily  . cyanocobalamin  1,000 mcg Intramuscular Daily  . feeding supplement  237 mL Oral TID BM  . insulin aspart  0-5 Units Subcutaneous QHS  . insulin aspart  0-9 Units Subcutaneous TID WC  . insulin glargine  50 Units Subcutaneous Daily  . [START ON 09/12/2020] metoprolol succinate  25 mg Oral Daily  . multivitamin with minerals  1 tablet Oral Daily  . pantoprazole  40 mg Oral Q1200   Continuous Infusions: . cefTRIAXone (ROCEPHIN)  IV 2 g (09/11/20 1211)  . heparin 1,450 Units/hr (09/11/20 1532)  . lactated ringers 50 mL/hr at 09/11/20 0910   PRN Meds: acetaminophen, nitroGLYCERIN   Vital Signs    Vitals:   09/10/20 1504 09/10/20 2030 09/10/20 2039 09/10/20 2056  BP: 112/67 106/68 100/68 (!) 87/56  Pulse: 79  91 85  Resp: (!) 23 20 (!) 28 20  Temp: (!) 97.5 F (36.4 C) 98.7 F (37.1 C)  98.7 F (37.1 C)  TempSrc: Oral Oral  Oral  SpO2: 98% 97% 97%   Weight:      Height:        Intake/Output Summary (Last 24 hours) at 09/11/2020 1548 Last data filed at 09/11/2020 1532 Gross per 24 hour  Intake 2146.89 ml  Output 5000 ml  Net -2853.11 ml   Filed Weights   09/08/20 1808  Weight: 73.9 kg    Telemetry    Sinus rhythm- Personally Reviewed  ECG     No new- Personally Reviewed  Physical Exam   GEN: No acute distress.   Neck: No JVD Cardiac: regular rhythm, normal rate, no murmurs, rubs, or gallops.  Respiratory: Clear to auscultation bilaterally. GI: Soft, nontender, non-distended  MS: No edema; No deformity. Neuro:  Nonfocal  Psych: Normal affect   Labs    Chemistry Recent Labs  Lab 09/06/20 0720 09/06/20 0720 09/08/20 1823 09/09/20 0409 09/10/20 0721 09/11/20 0439  NA 135   < > 137 136 140 139  K 4.2  --  3.5 2.8* 3.3* 4.3  CL 95*  --  102 103 106 110  CO2 18*  --  19* 17* 23 20*  GLUCOSE 274*   < > 134* 89 60* 169*  BUN 41*   < > 55* 51* 42* 42*  CREATININE 2.74*  --  2.80* 2.56* 2.15* 2.28*  CALCIUM 9.4  --  9.2 9.0 8.9 8.9  PROT  --   --  7.0 6.1*  --  6.3*  ALBUMIN  --   --  3.1* 2.8*  --  2.5*  AST  --   --  208* 182*  --  146*  ALT  --   --  173* 154*  --  150*  ALKPHOS  --   --  58 46  --  54  BILITOT  --   --  1.6* 1.3*  --  0.9  GFRNONAA 23*   < > 24* 27* 33* 31*  GFRAA 27*  --   --   --   --   --   ANIONGAP  --    < > 16* 16* 11 9   < > = values in this interval not displayed.     Hematology Recent Labs  Lab 09/09/20 0409 09/10/20 0142 09/11/20 0439  WBC 12.2* 12.1* 11.5*  RBC 4.32 3.94* 4.12*  HGB 12.3* 11.5* 11.7*  HCT 37.8* 33.4* 36.6*  MCV 87.5 84.8 88.8  MCH 28.5 29.2 28.4  MCHC 32.5 34.4 32.0  RDW 14.4 14.6 14.8  PLT 168 174 172    Cardiac EnzymesNo results for input(s): TROPONINI in the last 168 hours. No results for input(s): TROPIPOC in the last 168 hours.   BNP Recent Labs  Lab 09/10/20 0721 09/11/20 0432  BNP 457.4* 556.4*     DDimer  Recent Labs  Lab 09/10/20 0721 09/11/20 0439  DDIMER 0.50 0.75*     Radiology    ECHOCARDIOGRAM LIMITED  Result Date: 09/10/2020    ECHOCARDIOGRAM LIMITED REPORT   Patient Name:   Eugene [name] [facility] Date of Exam: 09/09/2020 Medical Rec #:  [id number]    Height:       66.0 in Accession #:    [id number]   Weight:       163.0 lb Date  of Birth:  [birth year]   BSA:          1.833 m Patient Age:    30 years     BP:           99/66 mmHg Patient Gender: M            HR:           81 bpm. Exam Location:  Inpatient Procedure: Limited Echo, Limited Color Doppler and Cardiac Doppler Indications:    NSTEMI  History:        Patient has prior history of Echocardiogram examinations, most                 recent 08/16/2020. Defibrillator and Prior CABG, Covid. chronic                 kidney disease; Risk Factors:Diabetes.  Sonographer:    Johny Chess Referring Phys: [id number] [place]  1. Left ventricular ejection fraction, by estimation, is 50 to 55%. The left ventricle has low normal function. The left ventricle has no regional wall motion abnormalities. Left ventricular diastolic parameters are consistent with Grade I diastolic dysfunction (impaired relaxation).  2. Right ventricular systolic function is normal.  3. The mitral valve is normal in structure. No evidence of mitral valve regurgitation.  4. The aortic valve is tricuspid. There is mild calcification of the aortic valve. There is mild thickening of the aortic valve. Aortic valve regurgitation is not visualized. No aortic stenosis is present. Comparison(s): A prior study was performed on 08/16/2020. No significant change from prior study. Prior images reviewed side by side. FINDINGS  Left Ventricle: Left ventricular ejection fraction, by estimation, is 50 to 55%. The left ventricle has low normal function. The left ventricle has no regional wall motion abnormalities. Abnormal (paradoxical) septal motion, consistent with RV pacemaker. Left ventricular diastolic parameters are consistent with Grade I diastolic dysfunction (impaired relaxation). Right Ventricle: Right  ventricular systolic function is normal. Pericardium: Trivial pericardial effusion is present. Mitral Valve: The mitral valve is normal in structure. Tricuspid Valve: The tricuspid valve is grossly normal.  Tricuspid valve regurgitation is not demonstrated. Aortic Valve: The aortic valve is tricuspid. There is mild calcification of the aortic valve. There is mild thickening of the aortic valve. Aortic valve regurgitation is not visualized. No aortic stenosis is present. Aorta: The aortic root and ascending aorta are structurally normal, with no evidence of dilitation. Additional Comments: A pacer wire is visualized. LEFT VENTRICLE PLAX 2D LVIDd:         4.60 cm     Diastology LVIDs:         3.70 cm     LV e' medial:    5.55 cm/s LV PW:         1.00 cm     LV E/e' medial:  11.9 LV IVS:        0.80 cm     LV e' lateral:   6.53 cm/s                            LV E/e' lateral: 10.1  LV Volumes (MOD) LV vol d, MOD A2C: 67.7 ml LV vol s, MOD A2C: 40.4 ml LV SV MOD A2C:     27.3 ml IVC IVC diam: 1.50 cm LEFT ATRIUM         Index LA diam:    3.80 cm 2.07 cm/m  AORTIC VALVE LVOT Vmax:   70.30 cm/s LVOT Vmean:  45.000 cm/s LVOT VTI:    0.126 m  AORTA Ao Asc diam: 2.90 cm MITRAL VALVE MV Area (PHT): 4.63 cm     SHUNTS MV Decel Time: 164 msec     Systemic VTI: 0.13 m MV E velocity: 66.00 cm/s MV A velocity: 105.00 cm/s MV E/A ratio:  0.63 Rudean Haskell MD Electronically signed by Rudean Haskell MD Signature Date/Time: 09/10/2020/12:10:15 PM    Final     Cardiac Studies   Echo this admission shows stable EF  Patient Profile     65 y.o. male presents to the hospital with new muscle weakness and elevation of CK, also found to have significant troponin elevation peaking at 13,000, suggestive of NSTEMI.  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  Also has chronic kidney disease, diabetes mellitus, hypertension, hyperlipidemia, history of stroke.  There is report of an abdominal aortogram/runoff study from 2015 but I am unable to locate this in the record for independent review.  Presents with  bilateral worsening progressive lower extremity weakness that is bilateral, found to have an elevated CK and elevated troponin.  Assessment & Plan   Principal Problem:   NSTEMI (non-ST elevated myocardial infarction) [facility]) Active Problems:   Single implantable cardioverter-defibrillator (ICD) in situ   Coronary artery disease involving coronary bypass graft of native heart with angina pectoris (HCC)   Type 2 diabetes mellitus with vascular disease (HCC)   Rhabdomyolysis   CKD (chronic kidney disease) stage 4, GFR 15-29 ml/min (HCC)   Unintended weight loss   Weakness of both legs   Elevated LFTs   Prolonged QT interval   Weak urine stream   Weakness   ILD (interstitial lung disease) ([place])   NSTEMI-troponin elevation out of proportion to the degree of chronic kidney disease and suggests  ACS.  The patient's last episode of chest pain was yesterday evening and prior to that 2 weeks ago.  He is also Covid positive.  He presented to the hospital with bilateral worsening leg weakness.  General surgery has deferred muscle biopsy due Covid positive status and NSTEMI.  I discussed with the patient that his chest pain may be able to be worked up as an outpatient given ongoing dual antiplatelet therapy, however evaluation for source of troponin elevation may be easiest accomplished in the hospital and may facilitate planning for muscle biopsy as an outpatient.  The leg weakness is the patient's primary concern.  With this in mind we will plan for coronary angiography tomorrow.  Patient is being hydrated due to elevated CK and renal failure.  Reviewed his case with interventional cardiology, given ongoing chest pain, this is felt to be a reasonable approach.  Continue aspirin and Plavix.  Continue IV heparin through cath tomorrow.  Patient consented as noted below.  Patient is currently hypotensive but appears to be mentating clearly and is asymptomatic.  Continue beta-blocker as tolerated.  Appears well  compensated and does not appear to need diuretic today.  I have discussed this plan with Dr. Candiss Norse from internal medicine and we are in agreement on the plan.  INFORMED CONSENT: I have reviewed the risks, indications, and alternatives to cardiac catheterization, possible angioplasty, and stenting with the patient. Risks include but are not limited to bleeding, infection, vascular injury, stroke, myocardial infection, arrhythmia, kidney injury, radiation-related injury in the case of prolonged fluoroscopy use, emergency cardiac surgery, and death. The patient understands the risks of serious complication is 1-2 in 1610 with diagnostic cardiac cath and 1-2% or less with angioplasty/stenting.   Total time of encounter: 35 minutes total time of encounter, including 20 minutes spent in face-to-face patient care on the date of this encounter. This time includes coordination of care and counseling regarding above mentioned problem list. Remainder of non-face-to-face time involved reviewing chart documents/testing relevant to the patient encounter and documentation in the medical record.  Cherlynn Kaiser, MD [place]        For questions or updates, please contact [facility] Please consult [email or web address] for contact info under        Signed, Elouise Munroe, MD  09/11/2020, 3:48 PM

## 2020-09-11 NOTE — Progress Notes (Addendum)
After discussion with multiple surgeons, recommendation is to NOT proceed with muscle biopsy in this acute setting.  He has active COVID (asymptomatic, but still carries surgical risk), NSTEMI with elevated troponins.  Just these two things alone make him a high risk surgical patient with GETA for a surgery that is not emergent.  We would recommend medical work up and to allow the patient to recover from his [place] and NSTEMI.  If symptoms persists and neurology would still like a muscle biopsy, then he can follow up as an outpatient for consideration of biopsy at that time.  We will sign off.  Eugene [name] 9:22 AM 09/11/2020

## 2020-09-11 NOTE — Progress Notes (Signed)
ANTICOAGULATION CONSULT NOTE Pharmacy Consult for Heparin Indication:  DVT  No Known Allergies  Patient Measurements: Height: 5\' 6"  (167.6 cm) Weight: 73.9 kg (163 lb) IBW/kg (Calculated) : 63.8 Heparin Dosing Weight: 73.9 kg  Vital Signs: Temp: 98.7 F (37.1 C) (02/13 2056) Temp Source: Oral (02/13 2056) BP: 87/56 (02/13 2056) Pulse Rate: 85 (02/13 2056)  Labs: Recent Labs    09/08/20 1823 09/08/20 1823 09/08/20 2121 09/09/20 0409 09/09/20 1206 09/09/20 1643 09/09/20 2048 09/10/20 0142 09/10/20 0721 09/10/20 2032 09/11/20 0335 09/11/20 1102  HGB 13.0  --   --  12.3*  --   --   --  11.5*  --   --   --   --   HCT 39.9  --   --  37.8*  --   --   --  33.4*  --   --   --   --   PLT 177  --   --  168  --   --   --  174  --   --   --   --   HEPARINUNFRC  --   --   --  0.15*   < >  --  0.34 0.39  --   --  0.24*  --   CREATININE 2.80*  --   --  2.56*  --   --   --   --  2.15*  --   --   --   CKTOTAL  --    < > 8,423* 6,921*  --  3,499*  --   --  5,937* 4,960*  --  4,843*  CKMB  --   --   --   --   --  28.2*  --   --   --   --   --   --   TROPONINIHS 10,598*  --  13,971* 6,389*  --   --   --   --   --  2,237*  --   --    < > = values in this interval not displayed.    Estimated Creatinine Clearance: 30.9 mL/min (A) (by C-G formula based on SCr of 2.15 mg/dL (H)).  Assessment: 65 y.o. male admitted with NSTEMI, now with RLE DVT, for heparin Goal of Therapy:  Heparin level 0.3-0.7 units/ml Monitor platelets by anticoagulation protocol: Yes   Plan:  Increase Heparin 1450 units/hr Check heparin level in 8 hours.  Phillis Knack, PharmD, BCPS

## 2020-09-11 NOTE — Progress Notes (Signed)
Initial Nutrition Assessment  DOCUMENTATION CODES:   Not applicable  INTERVENTION:   -Ensure Enlive po TID, each supplement provides 350 kcal and 20 grams of protein -MVI with minerals daily  NUTRITION DIAGNOSIS:   Increased nutrient needs related to acute illness (COVID-19) as evidenced by estimated needs.  GOAL:   Patient will meet greater than or equal to 90% of their needs  MONITOR:   PO intake,Supplement acceptance,Labs,Weight trends,Skin,I & O's  REASON FOR ASSESSMENT:   Malnutrition Screening Tool    ASSESSMENT:   Eugene [name] is a 65 y.o. 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  Pt admitted with rhabdomyolysis.   Reviewed I/O's: -4.5 L x 24 hours and -4.4 L since admission  UOP: 6.2 L x 24 hours  Pt unavailable at time of visit.   No meal completion data available at this time.   Reviewed wt hx; pt has experienced a 7.9% wt loss over the past year, which is significant for time frame. Per chart review, pt endorses a 60 pound weight loss over the past 14 months.    Highly suspect with malnutrition, however, unable ti identify at this time.   Per general surgery notes, no plans to complete muscle biopsy until medical work-up is completed. His lower extremity weakness is improving per chart review.   Medications reviewed and include vitamin B-12 and lactated ringers infusion @ 50 ml/hr.    Lab Results  Component Value Date   HGBA1C 7.7 (H) 07/06/2020   PTA DM medications are 10 mg dapagliflozin propanediol, 70 units insulin glargine daily, 1 mg semaglutide weekly, and 500 mg metformin BID.   Labs reviewed: CBGS: 147-149 (inpatient orders for glycemic control are 0-5 units insulin aspart daily at bedtime, 0-9  units insulin aspart TID with meals, and 50 units insulin glargine daily).   Diet Order:   Diet Order            Diet heart healthy/carb modified Room service appropriate? Yes; Fluid consistency: Thin  Diet effective now                 EDUCATION NEEDS:   No education needs have been identified at this time  Skin:  Skin Assessment: Reviewed RN Assessment  Last BM:  09/11/20  Height:   Ht Readings from Last 1 Encounters:  09/08/20 5\' 6"  (1.676 m)    Weight:   Wt Readings from Last 1 Encounters:  09/08/20 73.9 kg    Ideal Body Weight:  64.5 kg  BMI:  Body mass index is 26.31 kg/m.  Estimated Nutritional Needs:   Kcal:  7482-7078  Protein:  125-140 grams  Fluid:  > 2 L    Loistine Chance, RD, LDN, [place] Registered Dietitian II Certified Diabetes Care and Education Specialist Please refer to [facility] for RD and/or RD on-call/weekend/after hours pager

## 2020-09-11 NOTE — Progress Notes (Signed)
ANTICOAGULATION CONSULT NOTE - Follow Up Consult  Pharmacy Consult for heparin Indication: chest pain/ACS and DVT  No Known Allergies  Patient Measurements: Height: 5\' 6"  (167.6 cm) Weight: 73.9 kg (163 lb) IBW/kg (Calculated) : 63.8 Heparin Dosing Weight: 73.9 kg  Vital Signs:    Labs: Recent Labs    09/09/20 0409 09/09/20 1206 09/09/20 1643 09/09/20 2048 09/10/20 0142 09/10/20 0721 09/10/20 2032 09/11/20 0333 09/11/20 0335 09/11/20 0439 09/11/20 0921 09/11/20 1042 09/11/20 1102  HGB 12.3*  --   --   --  11.5*  --   --   --   --  11.7*  --   --   --   HCT 37.8*  --   --   --  33.4*  --   --   --   --  36.6*  --   --   --   PLT 168  --   --   --  174  --   --   --   --  172  --   --   --   HEPARINUNFRC 0.15*   < >  --    < > 0.39  --   --   --  0.24*  --   --  0.35  --   CREATININE 2.56*  --   --   --   --  2.15*  --   --   --  2.28*  --   --   --   CKTOTAL 6,921*  --  3,499*  --   --  5,937* 4,960* 4,695*  --   --  5,022*  --  4,843*  CKMB  --   --  28.2*  --   --   --   --   --   --   --  33.2*  --   --   TROPONINIHS 6,373*  --   --   --   --   --  2,237*  --  2,710*  --   --  2,496*  --    < > = values in this interval not displayed.    Estimated Creatinine Clearance: 29.1 mL/min (A) (by C-G formula based on SCr of 2.28 mg/dL (H)).   Medications:  Scheduled:  . aspirin EC  81 mg Oral Daily  . clopidogrel  75 mg Oral Daily  . cyanocobalamin  1,000 mcg Intramuscular Daily  . feeding supplement  237 mL Oral TID BM  . insulin aspart  0-5 Units Subcutaneous QHS  . insulin aspart  0-9 Units Subcutaneous TID WC  . insulin glargine  50 Units Subcutaneous Daily  . [START ON 09/12/2020] metoprolol succinate  25 mg Oral Daily  . multivitamin with minerals  1 tablet Oral Daily  . pantoprazole  40 mg Oral Q1200  . sodium chloride flush  3 mL Intravenous Q12H   Infusions:  . cefTRIAXone (ROCEPHIN)  IV 2 g (09/11/20 1211)  . heparin 1,450 Units/hr (09/11/20 1532)  .  lactated ringers 50 mL/hr at 09/11/20 7858    Assessment: 65 yo M initialy started on heparin for chest pain / angina, now found to also have RLE DVT.  Heparin level at low end of goal.  Will increase.  Goal of Therapy:  Heparin level 0.3-0.7 units/ml Monitor platelets by anticoagulation protocol: Yes   Plan:  Increase heparin to 1500 units/hr. Next heparin level with AM labs. Daily heparin level and CBC while on heparin  [employer], Pharm.D., BCPS Clinical Pharmacist  **  Pharmacist phone directory can be found on [email or web address] listed under [place].  09/11/2020 5:12 PM

## 2020-09-11 NOTE — Progress Notes (Signed)
Physical Therapy Treatment Patient Details Name: Eugene [name] MRN: [id number] DOB: February 06, 1956 Today's Date: 09/11/2020    History of Present Illness Pt is a 65 y.o. male admitted 09/08/20 with 1-2 wk h/o retrosternal chest pain and BLE weakness, as well as unintentional weight loss. Workup for NSTEMI, transaminitis, UTI; incidental COVID-19. (+) RLE DVT on 2/12. Head CT negative for acute injury. PMH includes CAD (s/p CABG, PCI; vfib arrest s/p ICD), CKD IV, HTN, DM.   PT Comments    Pt progressing well with mobility. Today's session focused on transfer and gait training with RW, as well as LE strengthening. Pt remains limited by decreased activity tolerance and generalized weakness requiring initial minA to come to standing, frequent seated rest breaks. Pt notes R calf "burning" pain that improved with ambulation distance. Will continue to follow acutely to address established goals.   Follow Up Recommendations  Home health PT;Supervision for mobility/OOB     Equipment Recommendations  Rolling walker with 5" wheels;3in1 -- TBD as pt may buy his own walker   Recommendations for Other Services       Precautions / Restrictions Precautions Precautions: Fall Restrictions Weight Bearing Restrictions: No    Mobility  Bed Mobility Overal bed mobility: Modified Independent Bed Mobility: Supine to Sit     Supine to sit: Supervision;HOB elevated     General bed mobility comments: Reliant on momentum and use of bed rail to power into sitting    Transfers Overall transfer level: Needs assistance Equipment used: Rolling walker (2 wheeled) Transfers: Sit to/from Stand Sit to Stand: Min assist;Min guard         General transfer comment: Reliant on momentum and minA to assist trunk elevation upon initial stand from bed to RW; performed 4x additional sit<>stands from recliner with RW and min guard, ability to achieve upright improving with each trial; heavy reliance on UE support to  push from armrests  Ambulation/Gait Ambulation/Gait assistance: Min guard Gait Distance (Feet): 40 Feet (+40'+40'+40') Assistive device: Rolling walker (2 wheeled) Gait Pattern/deviations: Step-through pattern;Decreased stride length;Trunk flexed Gait velocity: Decreased   General Gait Details: Slow, mostly steady gait with RW and min guard for balance; pt frequently running RW wheels into objects in crowded room, but able to self-correct (states, "My vision isn't as good as it should be"); pt ambulated 40' x4 rounds with seated rest breaks in between to recover BLE fatigue; c/o R lower calf pain improving with mobility; pt declined hallway ambulation   Stairs             Wheelchair Mobility    Modified Rankin (Stroke Patients Only)       Balance Overall balance assessment: Needs assistance Sitting-balance support: Single extremity supported;Feet supported Sitting balance-Leahy Scale: Fair Sitting balance - Comments: Static sitting balance improved; 1x LOB with weight shifts to adjust gown, able to correct     Standing balance-Leahy Scale: Poor Standing balance comment: Reliant on UE support                            Cognition Arousal/Alertness: Awake/alert Behavior During Therapy: WFL for tasks assessed/performed Overall Cognitive Status: Within Functional Limits for tasks assessed                                        Exercises General Exercises - Lower Extremity Long Arc Quad: AROM;Both;Seated  Hip Flexion/Marching: AROM;Both;Seated (limited range) Toe Raises: AROM;Both;Seated Heel Raises: AROM;Both;Seated    General Comments General comments (skin integrity, edema, etc.): HR 92, SpO2 100% on RA. Reports path from car into door is about ~43' walk with ramp - will plan to work up to this distance next session      Pertinent Vitals/Pain Pain Assessment: No/denies pain    Home Living                      Prior Function             PT Goals (current goals can now be found in the care plan section) Progress towards PT goals: Progressing toward goals    Frequency    Min 3X/week      PT Plan Current plan remains appropriate    Co-evaluation              AM-PAC PT "6 Clicks" Mobility   Outcome Measure  Help needed turning from your back to your side while in a flat bed without using bedrails?: A Little Help needed moving from lying on your back to sitting on the side of a flat bed without using bedrails?: A Little Help needed moving to and from a bed to a chair (including a wheelchair)?: A Little Help needed standing up from a chair using your arms (e.g., wheelchair or bedside chair)?: A Little Help needed to walk in hospital room?: A Little Help needed climbing 3-5 steps with a railing? : A Lot 6 Click Score: 17    End of Session Equipment Utilized During Treatment: Gait belt Activity Tolerance: Patient tolerated treatment well Patient left: in chair;with call bell/phone within reach Nurse Communication: Mobility status PT Visit Diagnosis: Unsteadiness on feet (R26.81);Other abnormalities of gait and mobility (R26.89);Muscle weakness (generalized) (M62.81)     Time: 0940-7680 PT Time Calculation (min) (ACUTE ONLY): 30 min  Charges:  $Gait Training: 8-22 mins $Therapeutic Exercise: 8-22 mins                     Mabeline Caras, PT, DPT Acute Rehabilitation Services  Pager [phone] Office [place] 09/11/2020, 1:54 PM

## 2020-09-11 NOTE — Treatment Plan (Signed)
Treatment Plan  Patient with a variety of potential confounding factors contributing to weakness. Also, deferral of muscle biopsy recommended by surgical team due to high-risk nature given acute illness.  In my view, the degree of CK elevation is relatively mild, and this can be tracked to determine if an inflammatory muscle disease is getting substantially worse (along with clinical symptoms of course). Also, as crestor is now being held, it will take some time to see if there is improvement in CK and clinical status. For these reasons, I am inclined to agree that deferring consideration of muscle biopsy makes sense. In some centers, muscle ultrasound may be informative in such situations, and may be worthwhile in this case given that MRI is precluded.  We are available to assist should there be signs of progressive neuromuscular weakness, myopathy/myositis, or other issues. Will follow loosely for the time being.  Thank you.  Eugene [name]

## 2020-09-11 NOTE — Progress Notes (Signed)
PROGRESS NOTE                                                                                                                                                                                                             Patient Demographics:    Eugene [name], is a 65 y.o. male, DOB - 1955-10-27, RSW:[id number]  Outpatient Primary MD for the patient is [name], MD   Admit date - 09/08/2020   LOS - 2  Chief Complaint  Patient presents with  . Weakness       Brief Narrative: Patient is a 65 y.o. male with PMHx of history of CAD s/p CABG-subsequent recent PCI in 06/2020-V. fib arrest-s/p ICD implantation, CKD stage IV, HTN, DM, HLD who presented to the hospital with 1-2-week history of retrosternal chest pain responsive to nitroglycerin tablets-and 1 week history of bilateral lower extremity weakness.  Per patient-approximately 6 months ago-he noted that he was having trouble walking up a flight of stairs-and had actually put a ramp to get to his house from his garage.  He also acknowledges having unintentional weight loss-around 40-50 pounds in the past 1 year  COVID-19 vaccinated status: Vaccinated-J&J vaccine-but not boosted.  Significant Events: 2/11>> Admit to [facility] for non-STEMI-and bilateral lower extremity weakness.  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 No focal weakness.  Bilateral leg weakness right leg more than left.   Assessment  & Plan :   Non-STEMI: Chest pain-free-continue medical management with aspirin/Plavix/beta-blocker/IV heparin.  Statins on hold due to significantly elevated CK levels.  Has extensive history of CAD-s/p CABG-and most recent PCI in December 2021.  Cards following.  Bilateral lower extremity weakness with ++ CK: Going on for at least 6 months-but markedly worse for the past 1 week, elevated CK levels although he did have a non-STEMI  as well, B12 levels are considerably low and has been placed on replacement, stable TSH, ESR is remarkably high, unable to do MRI due to pacemaker, besides B12 replacement multiple serology tests have been ordered by pulmonary results are pending.  If possible we will try and get right leg quadriceps muscle biopsy as it might be needed eventually anyways due to mixed picture.  Neuro on board along with general surgery.  Possible pulmonary fibrosis noted on CT scan.  Likely chronic, pulmonary following.  incidental Covid infection with evidence of interstitial lung disease on imaging.  Asymptomatic from COVID-19 standpoint, pulmonary following defer work-up of pulmonary fibrosis to pulmonary.    Acute lower extremity DVT.  For now heparin drip.  Transaminitis: Likely related to rhabdomyolysis-CK elevation.  No RUQ pain.  Complicated UTI: Probably related to diabetes-start Rocephin-have asked RN to send urine culture before starting antibiotics. He does acknowledge dysuria for the past several weeks.  Unintentional 40-50 pound weight loss in the past 1 year: Concern for undiagnosed malignancy-apparently was supposed to get colonoscopy-but due to recent PCI/dual antiplatelet agents this was placed on hold. Noncontrast CT abdomen/pelvis without any obvious malignancy. Will obtain noncontrast CT as well. Await TSH.  If inpatient work-up is negative-further work-up will need to be completed in the outpatient setting.  AKI versus CKD stage IV: Creatinine 2 years ago was under 1, currently creatinine gently coming down with hydration which will be continued due to elevated CK levels, outpatient nephrology follow-up post discharge.  Hypokalemia: Repleted.  DM-2 (A1c 7.7 on 07/06/2020): On Lantus to 60 units, continue SSI. Follow and adjust.   Recent Labs    09/10/20 1758 09/10/20 2054 09/11/20 0735  GLUCAP 161* 268* 149*   COVID-19 infection: Incidental-asymptomatic-Remdesivir x3 days.  Lab Results  Component Value Date   SARSCOV2NAA POSITIVE (A) 09/09/2020   [place] NEGATIVE 07/05/2020      GI prophylaxis: PPI  Condition - Guarded  Family Communication  :  [name]-Significant other-[phone]-updated on 09/10/20  Code Status :  Full Code  Diet :  Diet Order            Diet heart healthy/carb modified Room service appropriate? Yes; Fluid consistency: Thin  Diet effective now                  Disposition Plan  :   Status is: Inpatient  Remains inpatient appropriate because:Inpatient level of care appropriate due to severity of illness   Dispo: The patient is from: Home              Anticipated d/c is to: TBD              Anticipated d/c date is: > 3 days              Patient currently is not medically stable to d/c.   Difficult to place patient No  Barriers to discharge: Hypoxia requiring O2 supplementation/complete 5 days of IV Remdesivir  Antimicorbials  :    Anti-infectives (From admission, onward)   Start     Dose/Rate Route Frequency Ordered Stop   09/09/20 1300  cefTRIAXone (ROCEPHIN) 2 g in sodium chloride 0.9 % 100 mL IVPB        2 g 200 mL/hr over 30 Minutes Intravenous Every 24 hours 09/09/20 1109        Inpatient Medications  Scheduled Meds: . aspirin EC  81 mg Oral Daily  . clopidogrel  75 mg Oral Daily  . insulin aspart  0-5  Units Subcutaneous QHS   . insulin aspart  0-9 Units Subcutaneous TID WC  . insulin glargine  50 Units Subcutaneous Daily  . metoprolol succinate  50 mg Oral Daily  . pantoprazole  40 mg Oral Q1200   Continuous Infusions: . cefTRIAXone (ROCEPHIN)  IV 2 g (09/10/20 1308)  . heparin 1,450 Units/hr (09/11/20 5465)   PRN Meds:.acetaminophen, nitroGLYCERIN   Time Spent in minutes  25   See all Orders from today for further details   Lala Lund M.D on 09/11/2020 at 8:54 AM  To page go to [email or web address] - use universal password  [facility] -  Office  [phone]    Objective:   Vitals:   09/10/20 1504 09/10/20 2030 09/10/20 2039 09/10/20 2056  BP: 112/67 106/68 100/68 (!) 87/56  Pulse: 79  91 85  Resp: (!) 23 20 (!) 28 20  Temp: (!) 97.5 F (36.4 C) 98.7 F (37.1 C)  98.7 F (37.1 C)  TempSrc: Oral Oral  Oral  SpO2: 98% 97% 97%   Weight:      Height:        Wt Readings from Last 3 Encounters:  09/08/20 73.9 kg  08/04/20 80.2 kg  07/26/20 77.1 kg     Intake/Output Summary (Last 24 hours) at 09/11/2020 0854 Last data filed at 09/11/2020 0700 Gross per 24 hour  Intake 1714.34 ml  Output 6201 ml  Net -4486.66 ml     Physical Exam  Awake Alert, No new F.N deficits, Normal affect [place].AT,PERRAL Supple Neck,No JVD, No cervical lymphadenopathy appriciated.  Symmetrical Chest wall movement, Good air movement bilaterally, CTAB RRR,No Gallops, Rubs or new Murmurs, No Parasternal Heave +ve B.Sounds, Abd Soft, No tenderness, No organomegaly appriciated, No rebound - guarding or rigidity. No Cyanosis, Clubbing or edema, No new Rash or bruise     Data Review:    CBC Recent Labs  Lab 09/08/20 1823 09/09/20 0409 09/10/20 0142 09/11/20 0439  WBC 13.2* 12.2* 12.1* 11.5*  HGB 13.0 12.3* 11.5* 11.7*  HCT 39.9 37.8* 33.4* 36.6*  PLT 177 168 174 172  MCV 86.9 87.5 84.8 88.8  MCH 28.3 28.5 29.2 28.4  MCHC 32.6 32.5 34.4 32.0  RDW 14.3 14.4 14.6 14.8  LYMPHSABS 2.3  --   --    --   MONOABS 1.2*  --   --   --   EOSABS 0.1  --   --   --   BASOSABS 0.0  --   --   --     Chemistries  Recent Labs  Lab 09/06/20 0720 09/08/20 1823 09/09/20 0409 09/09/20 1643 09/10/20 0721 09/11/20 0439  NA 135 137 136  --  140 139  K 4.2 3.5 2.8*  --  3.3* 4.3  CL 95* 102 103  --  106 110  CO2 18* 19* 17*  --  23 20*  GLUCOSE 274* 134* 89  --  60* 169*  BUN 41* 55* 51*  --  42* 42*  CREATININE 2.74* 2.80* 2.56*  --  2.15* 2.28*  CALCIUM 9.4 9.2 9.0  --  8.9 8.9  MG  --   --   --  1.8 2.6* 2.4  AST  --  208* 182*  --   --  146*  ALT  --  173* 154*  --   --  150*  ALKPHOS  --  58 46  --   --  54  BILITOT  --  1.6* 1.3*  --   --  0.9   ------------------------------------------------------------------------------------------------------------------ Recent Labs    09/09/20 0409  CHOL 117  HDL 29*  LDLCALC 46  TRIG 211*  CHOLHDL 4.0    Lab Results  Component Value Date   HGBA1C 7.7 (H) 07/06/2020   ------------------------------------------------------------------------------------------------------------------ Recent Labs    09/09/20 1206  TSH 2.186   ------------------------------------------------------------------------------------------------------------------ Recent Labs    09/09/20 1206  VITAMINB12 91*    Coagulation profile No results for input(s): INR, PROTIME in the last 168 hours.  Recent Labs    09/10/20 0721 09/11/20 0439  DDIMER 0.50 0.75*    Cardiac Enzymes Recent Labs  Lab 09/09/20 1643  CKMB 28.2*   ------------------------------------------------------------------------------------------------------------------    Component Value Date/Time   BNP 556.4 (H) 09/11/2020 0432    Micro Results Recent Results (from the past 240 hour(s))  Resp Panel by RT-PCR (Flu A&B, Covid) Nasopharyngeal Swab     Status: Abnormal   Collection Time: 09/09/20 12:32 AM   Specimen: Nasopharyngeal Swab; Nasopharyngeal(NP) swabs in vial  transport medium  Result Value Ref Range Status   SARS Coronavirus 2 by RT PCR POSITIVE (A) NEGATIVE Final    Comment: RESULT CALLED TO, READ BACK BY AND VERIFIED WITH: Elige Radon RN 09/09/20 0224 JDW (NOTE) SARS-CoV-2 target nucleic acids are DETECTED.  The SARS-CoV-2 RNA is generally detectable in upper respiratory specimens during the acute phase of infection. Positive results are indicative of the presence of the identified virus, but do not rule out bacterial infection or co-infection with other pathogens not detected by the test. Clinical correlation with patient history and other diagnostic information is necessary to determine patient infection status. The expected result is Negative.  Fact Sheet for Patients: EntrepreneurPulse.com.au  Fact Sheet for Healthcare Providers: IncredibleEmployment.be  This test is not yet approved or cleared by the [place] FDA and  has been authorized for detection and/or diagnosis of SARS-CoV-2 by FDA under an Emergency Use Authorization (EUA).  This EUA will remain in effect (meaning this test can be [place] ed) for the duration of  the COVID-19 declaration under Section 564(b)(1) of the Act, 21 U.S.C. section 360bbb-3(b)(1), unless the authorization is terminated or revoked sooner.     Influenza A by PCR NEGATIVE NEGATIVE Final   Influenza B by PCR NEGATIVE NEGATIVE Final    Comment: (NOTE) The Xpert Xpress SARS-CoV-2/FLU/RSV plus assay is intended as an aid in the diagnosis of influenza from Nasopharyngeal swab specimens and should not be used as a sole basis for treatment. Nasal washings and aspirates are unacceptable for Xpert Xpress SARS-CoV-2/FLU/RSV testing.  Fact Sheet for Patients: EntrepreneurPulse.com.au  Fact Sheet for Healthcare Providers: IncredibleEmployment.be  This test is not yet approved or cleared by the [place] FDA and has been  authorized for detection and/or diagnosis of SARS-CoV-2 by FDA under an Emergency Use Authorization (EUA). This EUA will remain in effect (meaning this test can be used) for the duration of the COVID-19 declaration under Section 564(b)(1) of the Act, 21 U.S.C. section 360bbb-3(b)(1), unless the authorization is terminated or revoked.  Performed at [facility], [place] [address], [place], [place] [address]   Urine Culture     Status: Abnormal   Collection Time: 09/09/20 10:32 AM   Specimen: Urine, Random  Result Value Ref Range Status   Specimen Description URINE, RANDOM  Final   Special Requests   Final    Normal Performed at [facility], [place] [address], [place],  [place] [address]    Culture >=100,000 COLONIES/mL KLEBSIELLA  PNEUMONIAE (A)  Final   Report Status 09/11/2020 FINAL  Final   Organism ID, Bacteria KLEBSIELLA PNEUMONIAE (A)  Final      Susceptibility   Klebsiella pneumoniae - MIC*    AMPICILLIN >=32 RESISTANT Resistant     CEFAZOLIN <=4 SENSITIVE Sensitive     CEFEPIME <=0.12 SENSITIVE Sensitive     CEFTRIAXONE <=0.25 SENSITIVE Sensitive     CIPROFLOXACIN <=0.25 SENSITIVE Sensitive     GENTAMICIN <=1 SENSITIVE Sensitive     IMIPENEM <=0.25 SENSITIVE Sensitive     NITROFURANTOIN 64 INTERMEDIATE Intermediate     TRIMETH/SULFA <=20 SENSITIVE Sensitive     AMPICILLIN/SULBACTAM 4 SENSITIVE Sensitive     PIP/TAZO <=4 SENSITIVE Sensitive     * >=100,000 COLONIES/mL KLEBSIELLA PNEUMONIAE    Radiology Reports CT ABDOMEN PELVIS WO CONTRAST  Result Date: 09/09/2020 CLINICAL DATA:  Weight loss, and intended. EXAM: CT ABDOMEN AND PELVIS WITHOUT CONTRAST TECHNIQUE: Multidetector CT imaging of the abdomen and pelvis was performed following the standard protocol without IV contrast. COMPARISON:  None. FINDINGS: Lower chest: Coronary artery calcifications. Partially visualized cardiac defibrillator. Diffuse reticulations of the visualized lung bases as well as suggestion  of emphysematous changes. Possible tiny hiatal hernia. Hepatobiliary: No focal liver abnormality. Several calcified gallstones within the gallbladder lumen. No gallbladder wall thickening or pericholecystic fluid. No biliary dilatation. Pancreas: No focal lesion. Normal pancreatic contour. No surrounding inflammatory changes. No main pancreatic ductal dilatation. Spleen: Normal in size without focal abnormality. Suggestion of a splenule (3:33). Adrenals/Urinary Tract: No adrenal nodule bilaterally. No nephrolithiasis, no hydronephrosis, and no contour-deforming renal mass. No ureterolithiasis or hydroureter. Urinary bladder wall emphysema. Stomach/Bowel: PO contrast reaches the mid small bowel. Stomach is within normal limits. No evidence of bowel wall thickening or dilatation. Appendix appears normal. Vascular/Lymphatic: No abdominal aorta or iliac aneurysm. Mild atherosclerotic plaque of the aorta and its branches. No abdominal, pelvic, or inguinal lymphadenopathy. Reproductive: Prostate is unremarkable. Other: No intraperitoneal free fluid. No intraperitoneal free gas. No organized fluid collection. Musculoskeletal: No abdominal wall hernia or abnormality No suspicious lytic or blastic osseous lesions. No acute displaced fracture. Multilevel degenerative changes of the spine. IMPRESSION: 1. Emphysematous cystitis. 2. Please note limited evaluation of the abdomen and pelvis due to noncontrast study. 3. Other imaging findings of potential clinical significance: Fibrosis and emphysematous changes of the lungs. Aortic Atherosclerosis (ICD10-I70.0). Electronically Signed   By: Iven Finn M.D.   On: 09/09/2020 03:09   CT HEAD WO CONTRAST  Result Date: 09/09/2020 CLINICAL DATA:  Neurological deficit.  Acute stroke suspected. EXAM: CT HEAD WITHOUT CONTRAST TECHNIQUE: Contiguous axial images were obtained from the base of the skull through the vertex without intravenous contrast. COMPARISON:  06/01/2014 FINDINGS:  Brain: Mild to moderate generalized brain volume loss. Numerous areas of low-density in the hemispheric white matter and basal ganglia consistent with chronic small vessel disease. No sign of acute infarction, mass lesion, hemorrhage, hydrocephalus or extra-axial collection. Vascular: There is atherosclerotic calcification of the major vessels at the base of the brain. Skull: Negative Sinuses/Orbits: Clear/normal Other: None IMPRESSION: No acute finding by CT. Atrophy and chronic small-vessel ischemic changes of the white matter and basal ganglia. Electronically Signed   By: Nelson Chimes M.D.   On: 09/09/2020 00:55   CT CHEST WO CONTRAST  Result Date: 09/09/2020 CLINICAL DATA:  COVID positive. Generalized weakness. Retrosternal chest pain. EXAM: CT CHEST WITHOUT CONTRAST TECHNIQUE: Multidetector CT imaging of the chest was performed following the standard protocol without IV contrast. COMPARISON:  Chest radiograph from one day prior. FINDINGS: Cardiovascular: Top-normal heart size. Single lead left subclavian ICD with lead tip in the right ventricular apex. No significant pericardial effusion/thickening. Three-vessel coronary atherosclerosis status post CABG. Atherosclerotic nonaneurysmal thoracic aorta. Normal caliber pulmonary arteries. Mediastinum/Nodes: No discrete thyroid nodules. Unremarkable esophagus. No pathologically enlarged axillary, mediastinal or hilar lymph nodes, noting limited sensitivity for the detection of hilar adenopathy on this noncontrast study. Lungs/Pleura: No pneumothorax. No pleural effusion. Mild centrilobular emphysema. Solid 5 mm right middle lobe pulmonary nodule along the minor fissure (series 5/image 75). No acute consolidative airspace disease, lung masses or additional significant pulmonary nodules. Extensive patchy confluent peripheral reticulation and ground-glass opacity throughout both lungs with associated moderate traction bronchiectasis, architectural distortion and  volume loss. There is a basilar predominance to these findings. No frank honeycombing. Upper abdomen: Cholelithiasis. Musculoskeletal: No aggressive appearing focal osseous lesions. Intact sternotomy wires. Marked thoracic spondylosis. IMPRESSION: 1. 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 Suggest outpatient pulmonology consultation and follow-up high-resolution chest CT study in 6-12 months. Findings are categorized as probable UIP per consensus guidelines: Diagnosis of Idiopathic Pulmonary Fibrosis: An Official ATS/ERS/JRS/ALAT Clinical Practice Guideline. [place], Iss 5, [phone], Mar 29 2017. 2. Solid 5 mm right middle lobe pulmonary nodule along the minor fissure. Follow-up noncontrast chest CT recommended in 12 months in this high risk patient. This recommendation follows the consensus statement: Guidelines for Management of Incidental Pulmonary Nodules Detected on CT Images: From the Fleischner Society 2017; Radiology 2017; 284:228-243. 3. Cholelithiasis. 4. Aortic Atherosclerosis (ICD10-I70.0) and Emphysema (ICD10-J43.9). Electronically Signed   By: Ilona Sorrel M.D.   On: 09/09/2020 12:08   DG Chest Portable 1 View  Result Date: 09/08/2020 CLINICAL DATA:  Chest pain. EXAM: PORTABLE CHEST 1 VIEW COMPARISON:  September 12, 2010 FINDINGS: A multi lead AICD is noted. Multiple sternal wires are seen. The heart size and mediastinal contours are within normal limits. A radiopaque loop recorder device is seen. There is no evidence of acute infiltrate, pleural effusion or pneumothorax. Degenerative changes are seen throughout the thoracic spine. IMPRESSION: Stable exam without acute or active cardiopulmonary disease. Electronically Signed   By: Virgina Norfolk M.D.   On: 09/08/2020 18:50   VAS [place] LOWER EXTREMITY VENOUS (DVT) (ONLY [facility] & [facility])  Result Date: 09/09/2020  Lower Venous DVT Study Indications:  Covid-19.  Limitations: Shadowing from arterial plaque, clothing. Comparison Study: No prior study Performing Technologist: Sharion Dove RVS  Examination Guidelines: A complete evaluation includes B-mode imaging, spectral Doppler, color Doppler, and power Doppler as needed of all accessible portions of each vessel. Bilateral testing is considered an integral part of a complete examination. Limited examinations for reoccurring indications may be performed as noted. The reflux portion of the exam is performed with the patient in reverse Trendelenburg.  +---------+---------------+---------+-----------+----------+-------------------+ RIGHT    CompressibilityPhasicitySpontaneityPropertiesThrombus Aging      +---------+---------------+---------+-----------+----------+-------------------+ CFV                                                   Not well visualized +---------+---------------+---------+-----------+----------+-------------------+ SFJ  Not well visualized +---------+---------------+---------+-----------+----------+-------------------+ FV Prox  Full           Yes      Yes                                      +---------+---------------+---------+-----------+----------+-------------------+ FV Mid   Full                                                             +---------+---------------+---------+-----------+----------+-------------------+ FV DistalFull                                                             +---------+---------------+---------+-----------+----------+-------------------+ PFV                                                   Not well visualized +---------+---------------+---------+-----------+----------+-------------------+ POP      Partial        Yes      Yes                  Acute               +---------+---------------+---------+-----------+----------+-------------------+ PTV       Full                                                             +---------+---------------+---------+-----------+----------+-------------------+ PERO     None                                         Acute               +---------+---------------+---------+-----------+----------+-------------------+ Gastroc  None                                         Acute               +---------+---------------+---------+-----------+----------+-------------------+   +---------+---------------+---------+-----------+----------+-------------------+ LEFT     CompressibilityPhasicitySpontaneityPropertiesThrombus Aging      +---------+---------------+---------+-----------+----------+-------------------+ CFV                                                   Not well visualized +---------+---------------+---------+-----------+----------+-------------------+ SFJ  Not well visualized +---------+---------------+---------+-----------+----------+-------------------+ FV Prox  Full           Yes      Yes                                      +---------+---------------+---------+-----------+----------+-------------------+ FV Mid   Full                                                             +---------+---------------+---------+-----------+----------+-------------------+ FV DistalFull           Yes      Yes                                      +---------+---------------+---------+-----------+----------+-------------------+ PFV                                                   Not well visualized +---------+---------------+---------+-----------+----------+-------------------+ POP      Full           Yes      Yes                                      +---------+---------------+---------+-----------+----------+-------------------+ PTV      Full                                                              +---------+---------------+---------+-----------+----------+-------------------+ PERO     Full                                                             +---------+---------------+---------+-----------+----------+-------------------+    Summary: RIGHT: - Findings consistent with acute deep vein thrombosis involving the right popliteal vein, right peroneal veins, and right gastrocnemius veins. - There is no evidence of deep vein thrombosis in the lower extremity. - There is no evidence of superficial venous thrombosis.  LEFT: - No evidence of common femoral vein obstruction.  *See table(s) above for measurements and observations. Electronically signed by Harold Barban MD on 09/09/2020 at 7:57:19 PM.    Final    ECHOCARDIOGRAM LIMITED  Result Date: 09/10/2020    ECHOCARDIOGRAM LIMITED REPORT   Patient Name:   Eugene [name] [facility] Date of Exam: 09/09/2020 Medical Rec #:  [id number]    Height:       66.0 in Accession #:    [id number]   Weight:       163.0 lb Date of Birth:  [birth year]   BSA:          1.833 m  Patient Age:    83 years     BP:           99/66 mmHg Patient Gender: M            HR:           81 bpm. Exam Location:  Inpatient Procedure: Limited Echo, Limited Color Doppler and Cardiac Doppler Indications:    NSTEMI  History:        Patient has prior history of Echocardiogram examinations, most                 recent 08/16/2020. Defibrillator and Prior CABG, Covid. chronic                 kidney disease; Risk Factors:Diabetes.  Sonographer:    Johny Chess Referring Phys: [id number] [place]  1. Left ventricular ejection fraction, by estimation, is 50 to 55%. The left ventricle has low normal function. The left ventricle has no regional wall motion abnormalities. Left ventricular diastolic parameters are consistent with Grade I diastolic dysfunction (impaired relaxation).  2. Right ventricular systolic function is normal.  3. The mitral valve is normal in structure. No evidence of  mitral valve regurgitation.  4. The aortic valve is tricuspid. There is mild calcification of the aortic valve. There is mild thickening of the aortic valve. Aortic valve regurgitation is not visualized. No aortic stenosis is present. Comparison(s): A prior study was performed on 08/16/2020. No significant change from prior study. Prior images reviewed side by side. FINDINGS  Left Ventricle: Left ventricular ejection fraction, by estimation, is 50 to 55%. The left ventricle has low normal function. The left ventricle has no regional wall motion abnormalities. Abnormal (paradoxical) septal motion, consistent with RV pacemaker. Left ventricular diastolic parameters are consistent with Grade I diastolic dysfunction (impaired relaxation). Right Ventricle: Right ventricular systolic function is normal. Pericardium: Trivial pericardial effusion is present. Mitral Valve: The mitral valve is normal in structure. Tricuspid Valve: The tricuspid valve is grossly normal. Tricuspid valve regurgitation is not demonstrated. Aortic Valve: The aortic valve is tricuspid. There is mild calcification of the aortic valve. There is mild thickening of the aortic valve. Aortic valve regurgitation is not visualized. No aortic stenosis is present. Aorta: The aortic root and ascending aorta are structurally normal, with no evidence of dilitation. Additional Comments: A pacer wire is visualized. LEFT VENTRICLE PLAX 2D LVIDd:         4.60 cm     Diastology LVIDs:         3.70 cm     LV e' medial:    5.55 cm/s LV PW:         1.00 cm     LV E/e' medial:  11.9 LV IVS:        0.80 cm     LV e' lateral:   6.53 cm/s                            LV E/e' lateral: 10.1  LV Volumes (MOD) LV vol d, MOD A2C: 67.7 ml LV vol s, MOD A2C: 40.4 ml LV SV MOD A2C:     27.3 ml IVC IVC diam: 1.50 cm LEFT ATRIUM         Index LA diam:    3.80 cm 2.07 cm/m  AORTIC VALVE LVOT Vmax:   70.30 cm/s LVOT Vmean:  45.000 cm/s LVOT VTI:    0.126 m  AORTA  Ao Asc diam: 2.90 cm  MITRAL VALVE MV Area (PHT): 4.63 cm     SHUNTS MV Decel Time: 164 msec     Systemic VTI: 0.13 m MV E velocity: 66.00 cm/s MV A velocity: 105.00 cm/s MV E/A ratio:  0.63 Rudean Haskell MD Electronically signed by Rudean Haskell MD Signature Date/Time: 09/10/2020/12:10:15 PM    Final    ECHOCARDIOGRAM LIMITED  Result Date: 08/16/2020    ECHOCARDIOGRAM LIMITED REPORT   Patient Name:   Eugene [name] [facility]  Date of Exam: 08/16/2020 Medical Rec #:  [id number]     Height:       66.0 in Accession #:    [id number]    Weight:       176.8 lb Date of Birth:  [birth year]    BSA:          1.898 m Patient Age:    7 years      BP:           111/67 mmHg Patient Gender: M             HR:           71 bpm. Exam Location:  [facility] Procedure: Limited Echo, Limited Color Doppler, Cardiac Doppler and Intracardiac            Opacification Agent Indications:    I50.9 CHF  History:        Patient has prior history of Echocardiogram examinations, most                 recent 07/06/2020. CHF, Previous Myocardial Infarction and CAD,                 Prior CABG, Stroke; Risk Factors:Hypertension, HLD, Diabetes and                 Dyslipidemia.  Sonographer:    Marygrace Drought RCS Referring Phys: [id number] Johnson A CHANDRASEKHAR IMPRESSIONS  1. Left ventricular ejection fraction, by estimation, is 50 to 55%. The left ventricle has low normal function. The left ventricle has no regional wall motion abnormalities.  2. Right ventricular systolic function is normal. The right ventricular size is normal. There is normal pulmonary artery systolic pressure.  3. Left atrial size was mildly dilated.  4. The pericardial effusion is posterior and lateral to the left ventricle.  5. The mitral valve is normal in structure. No evidence of mitral valve regurgitation. No evidence of mitral stenosis.  6. The aortic valve is normal in structure. Aortic valve regurgitation is not visualized. No aortic stenosis is present.  7. The inferior vena cava is normal  in size with greater than 50% respiratory variability, suggesting right atrial pressure of 3 mmHg. FINDINGS  Left Ventricle: Left ventricular ejection fraction, by estimation, is 50 to 55%. The left ventricle has low normal function. The left ventricle has no regional wall motion abnormalities. The left ventricular internal cavity size was normal in size. There is no left ventricular hypertrophy. Right Ventricle: The right ventricular size is normal. No increase in right ventricular wall thickness. Right ventricular systolic function is normal. There is normal pulmonary artery systolic pressure. The tricuspid regurgitant velocity is 2.13 m/s, and  with an assumed right atrial pressure of 3 mmHg, the estimated right ventricular systolic pressure is 88.3 mmHg. Left Atrium: Left atrial size was mildly dilated. Right Atrium: Right atrial size was normal in size. Pericardium: Trivial pericardial effusion is present. The pericardial effusion is posterior and lateral to the left  ventricle. Mitral Valve: The mitral valve is normal in structure. There is mild thickening of the mitral valve leaflet(s). There is mild calcification of the mitral valve leaflet(s). Mild mitral annular calcification. No evidence of mitral valve stenosis. Tricuspid Valve: The tricuspid valve is normal in structure. Tricuspid valve regurgitation is not demonstrated. No evidence of tricuspid stenosis. Aortic Valve: The aortic valve is normal in structure. Aortic valve regurgitation is not visualized. No aortic stenosis is present. Pulmonic Valve: The pulmonic valve was normal in structure. Pulmonic valve regurgitation is not visualized. No evidence of pulmonic stenosis. Aorta: The aortic root is normal in size and structure. Venous: The inferior vena cava is normal in size with greater than 50% respiratory variability, suggesting right atrial pressure of 3 mmHg. IAS/Shunts: No atrial level shunt detected by color flow Doppler. Additional Comments: A  pacer wire is visualized. LEFT VENTRICLE PLAX 2D LVIDd:         4.10 cm LVIDs:         2.90 cm LV PW:         1.40 cm LV IVS:        0.80 cm LVOT diam:     2.20 cm LVOT Area:     3.80 cm  RIGHT VENTRICLE RVSP:           21.1 mmHg LEFT ATRIUM         Index      RIGHT ATRIUM LA diam:    3.90 cm 2.06 cm/m RA Pressure: 3.00 mmHg   AORTA Ao Root diam: 3.40 cm Ao Asc diam:  2.80 cm MITRAL VALVE                TRICUSPID VALVE MV Area (PHT):              TR Peak grad:   18.1 mmHg MV Decel Time:              TR Vmax:        213.00 cm/s MV E velocity: 85.70 cm/s   Estimated RAP:  3.00 mmHg MV A velocity: 109.00 cm/s  RVSP:           21.1 mmHg MV E/A ratio:  0.79                             SHUNTS                             Systemic Diam: 2.20 cm Jenkins Rouge MD Electronically signed by Jenkins Rouge MD Signature Date/Time: 08/16/2020/9:55:39 AM    Final

## 2020-09-12 ENCOUNTER — Encounter (HOSPITAL_COMMUNITY)
Admission: EM | Disposition: A | Discharge status: Home Health | Payer: Self-pay | Source: Home / Self Care | Attending: Internal Medicine

## 2020-09-12 ENCOUNTER — Telehealth: Payer: Self-pay | Admitting: Internal Medicine

## 2020-09-12 DIAGNOSIS — I2510 Atherosclerotic heart disease of native coronary artery without angina pectoris: Secondary | ICD-10-CM | POA: Diagnosis not present

## 2020-09-12 DIAGNOSIS — I25810 Atherosclerosis of coronary artery bypass graft(s) without angina pectoris: Secondary | ICD-10-CM

## 2020-09-12 DIAGNOSIS — I214 Non-ST elevation (NSTEMI) myocardial infarction: Secondary | ICD-10-CM | POA: Diagnosis not present

## 2020-09-12 HISTORY — PX: LEFT HEART CATH AND CORS/GRAFTS ANGIOGRAPHY: CATH118250

## 2020-09-12 HISTORY — PX: CORONARY BALLOON ANGIOPLASTY: CATH118233

## 2020-09-12 LAB — COMPREHENSIVE METABOLIC PANEL
ALT: 155 U/L — ABNORMAL HIGH (ref 0–44)
AST: 150 U/L — ABNORMAL HIGH (ref 15–41)
Albumin: 2.5 g/dL — ABNORMAL LOW (ref 3.5–5.0)
Alkaline Phosphatase: 57 U/L (ref 38–126)
Anion gap: 9 (ref 5–15)
BUN: 38 mg/dL — ABNORMAL HIGH (ref 8–23)
CO2: 21 mmol/L — ABNORMAL LOW (ref 22–32)
Calcium: 9 mg/dL (ref 8.9–10.3)
Chloride: 109 mmol/L (ref 98–111)
Creatinine, Ser: 2.22 mg/dL — ABNORMAL HIGH (ref 0.61–1.24)
GFR, Estimated: 32 mL/min — ABNORMAL LOW (ref >60)
Glucose, Bld: 141 mg/dL — ABNORMAL HIGH (ref 70–99)
Potassium: 3.6 mmol/L (ref 3.5–5.1)
Sodium: 139 mmol/L (ref 135–145)
Total Bilirubin: 0.8 mg/dL (ref 0.3–1.2)
Total Protein: 6 g/dL — ABNORMAL LOW (ref 6.5–8.1)

## 2020-09-12 LAB — GLUCOSE, CAPILLARY
Glucose-Capillary: 107 mg/dL — ABNORMAL HIGH (ref 70–99)
Glucose-Capillary: 110 mg/dL — ABNORMAL HIGH (ref 70–99)
Glucose-Capillary: 108 mg/dL — ABNORMAL HIGH (ref 70–99)

## 2020-09-12 LAB — QUANTIFERON-TB GOLD PLUS: QuantiFERON-TB Gold Plus: NEGATIVE

## 2020-09-12 LAB — CBC
HCT: 35.2 % — ABNORMAL LOW (ref 39.0–52.0)
Hemoglobin: 11.4 g/dL — ABNORMAL LOW (ref 13.0–17.0)
MCH: 28.6 pg (ref 26.0–34.0)
MCHC: 32.4 g/dL (ref 30.0–36.0)
MCV: 88.2 fL (ref 80.0–100.0)
Platelets: 181 10*3/uL (ref 150–400)
RBC: 3.99 MIL/uL — ABNORMAL LOW (ref 4.22–5.81)
RDW: 14.7 % (ref 11.5–15.5)
WBC: 11.4 10*3/uL — ABNORMAL HIGH (ref 4.0–10.5)
nRBC: 0 % (ref 0.0–0.2)

## 2020-09-12 LAB — TROPONIN I (HIGH SENSITIVITY): Troponin I (High Sensitivity): 1996 ng/L (ref <18)

## 2020-09-12 LAB — GLUCOSE 6 PHOSPHATE DEHYDROGENASE
G6PDH: 10.4 U/g{Hb} (ref 4.8–15.7)
Hemoglobin: 12.1 g/dL — ABNORMAL LOW (ref 13.0–17.7)

## 2020-09-12 LAB — POCT I-STAT 7, (LYTES, BLD GAS, ICA,H+H)
Acid-base deficit: 13 mmol/L — ABNORMAL HIGH (ref 0.0–2.0)
Bicarbonate: 14.2 mmol/L — ABNORMAL LOW (ref 20.0–28.0)
Calcium, Ion: 1.09 mmol/L — ABNORMAL LOW (ref 1.15–1.40)
HCT: 37 % — ABNORMAL LOW (ref 39.0–52.0)
Hemoglobin: 12.6 g/dL — ABNORMAL LOW (ref 13.0–17.0)
O2 Saturation: 92 %
Potassium: 3.3 mmol/L — ABNORMAL LOW (ref 3.5–5.1)
Sodium: 113 mmol/L — CL (ref 135–145)
TCO2: 15 mmol/L — ABNORMAL LOW (ref 22–32)
pCO2 arterial: 37.1 mmHg (ref 32.0–48.0)
pH, Arterial: 7.19 — CL (ref 7.350–7.450)
pO2, Arterial: 80 mmHg — ABNORMAL LOW (ref 83.0–108.0)

## 2020-09-12 LAB — BRAIN NATRIURETIC PEPTIDE: B Natriuretic Peptide: 359 pg/mL — ABNORMAL HIGH (ref 0.0–100.0)

## 2020-09-12 LAB — QUANTIFERON-TB GOLD PLUS (RQFGPL)
QuantiFERON Mitogen Value: 5.54 IU/mL
QuantiFERON Nil Value: 0.01 IU/mL
QuantiFERON TB1 Ag Value: 0.01 IU/mL
QuantiFERON TB2 Ag Value: 0 IU/mL

## 2020-09-12 LAB — D-DIMER, QUANTITATIVE: D-Dimer, Quant: 0.84 ug/mL-FEU — ABNORMAL HIGH (ref 0.00–0.50)

## 2020-09-12 LAB — GLOMERULAR BASEMENT MEMBRANE ANTIBODIES: GBM Ab: 3 units (ref 0–20)

## 2020-09-12 LAB — POCT ACTIVATED CLOTTING TIME
Activated Clotting Time: 255 seconds
Activated Clotting Time: 255 seconds
Activated Clotting Time: 279 seconds
Activated Clotting Time: 725 seconds
Activated Clotting Time: 297 seconds

## 2020-09-12 LAB — CK TOTAL AND CKMB (NOT AT ARMC)
CK, MB: 32.4 ng/mL — ABNORMAL HIGH (ref 0.5–5.0)
Relative Index: 0.7 (ref 0.0–2.5)
Total CK: 4836 U/L — ABNORMAL HIGH (ref 49–397)

## 2020-09-12 LAB — C-REACTIVE PROTEIN: CRP: 1.4 mg/dL — ABNORMAL HIGH (ref <1.0)

## 2020-09-12 LAB — HEPARIN LEVEL (UNFRACTIONATED): Heparin Unfractionated: 0.3 IU/mL (ref 0.30–0.70)

## 2020-09-12 LAB — MAGNESIUM: Magnesium: 2.2 mg/dL (ref 1.7–2.4)

## 2020-09-12 LAB — ANTINUCLEAR ANTIBODIES, IFA: ANA Ab, IFA: NEGATIVE

## 2020-09-12 SURGERY — CORONARY BALLOON ANGIOPLASTY
Anesthesia: LOCAL

## 2020-09-12 MED ORDER — LIDOCAINE HCL (PF) 1 % IJ SOLN
INTRAMUSCULAR | Status: DC | PRN
  Administered 2020-09-12: 18 mL

## 2020-09-12 MED ORDER — ASPIRIN 81 MG PO CHEW
81.0000 mg | CHEWABLE_TABLET | ORAL | Status: AC
  Administered 2020-09-12: 81 mg via ORAL
  Filled 2020-09-12: qty 1

## 2020-09-12 MED ORDER — SODIUM CHLORIDE 0.9 % IV SOLN: INTRAVENOUS | Status: DC

## 2020-09-12 MED ORDER — SODIUM CHLORIDE 0.9% FLUSH: 3.0000 mL | Freq: Two times a day (BID) | INTRAVENOUS | Status: DC

## 2020-09-12 MED ORDER — FENTANYL CITRATE (PF) 100 MCG/2ML IJ SOLN
INTRAMUSCULAR | Status: AC
  Filled 2020-09-12: qty 2

## 2020-09-12 MED ORDER — METHYLPREDNISOLONE SODIUM SUCC 40 MG IJ SOLR: 40.0000 mg | Freq: Two times a day (BID) | INTRAMUSCULAR | Status: DC

## 2020-09-12 MED ORDER — SODIUM CHLORIDE 0.9 % WEIGHT BASED INFUSION
1.0000 mL/kg/h | INTRAVENOUS | Status: DC
  Administered 2020-09-12: 1 mL/kg/h via INTRAVENOUS

## 2020-09-12 MED ORDER — NITROGLYCERIN 1 MG/10 ML FOR IR/CATH LAB
INTRA_ARTERIAL | Status: DC | PRN
  Administered 2020-09-12 (×4): 100 ug via INTRACORONARY

## 2020-09-12 MED ORDER — ATROPINE SULFATE 1 MG/10ML IJ SOSY
PREFILLED_SYRINGE | INTRAMUSCULAR | Status: AC
  Filled 2020-09-12: qty 10

## 2020-09-12 MED ORDER — ACETAMINOPHEN 325 MG PO TABS: 650.0000 mg | ORAL_TABLET | ORAL | Status: DC | PRN

## 2020-09-12 MED ORDER — FENTANYL CITRATE (PF) 100 MCG/2ML IJ SOLN
INTRAMUSCULAR | Status: DC | PRN
  Administered 2020-09-12: 25 ug via INTRAVENOUS

## 2020-09-12 MED ORDER — SODIUM CHLORIDE 0.9 % WEIGHT BASED INFUSION: 1.0000 mL/kg/h | INTRAVENOUS | Status: DC

## 2020-09-12 MED ORDER — HEPARIN SODIUM (PORCINE) 5000 UNIT/ML IJ SOLN
5000.0000 [IU] | Freq: Three times a day (TID) | INTRAMUSCULAR | Status: DC
  Administered 2020-09-13: 5000 [IU] via SUBCUTANEOUS
  Filled 2020-09-12: qty 1

## 2020-09-12 MED ORDER — TICAGRELOR 90 MG PO TABS
90.0000 mg | ORAL_TABLET | Freq: Two times a day (BID) | ORAL | Status: DC
  Administered 2020-09-13 – 2020-09-16 (×8): 90 mg via ORAL
  Filled 2020-09-12 (×8): qty 1

## 2020-09-12 MED ORDER — HEPARIN (PORCINE) IN NACL 1000-0.9 UT/500ML-% IV SOLN
INTRAVENOUS | Status: AC
  Filled 2020-09-12: qty 1000

## 2020-09-12 MED ORDER — HEPARIN (PORCINE) IN NACL 1000-0.9 UT/500ML-% IV SOLN
INTRAVENOUS | Status: DC | PRN
  Administered 2020-09-12 (×2): 500 mL

## 2020-09-12 MED ORDER — ATORVASTATIN CALCIUM 80 MG PO TABS
80.0000 mg | ORAL_TABLET | Freq: Every day | ORAL | Status: DC
  Administered 2020-09-12 – 2020-09-16 (×5): 80 mg via ORAL
  Filled 2020-09-12 (×5): qty 1

## 2020-09-12 MED ORDER — SODIUM CHLORIDE 0.9% FLUSH: 3.0000 mL | INTRAVENOUS | Status: DC | PRN

## 2020-09-12 MED ORDER — HYDRALAZINE HCL 20 MG/ML IJ SOLN: 10.0000 mg | INTRAMUSCULAR | Status: AC | PRN

## 2020-09-12 MED ORDER — METHYLPREDNISOLONE SODIUM SUCC 40 MG IJ SOLR
40.0000 mg | Freq: Every day | INTRAMUSCULAR | Status: DC
  Administered 2020-09-12 – 2020-09-14 (×3): 40 mg via INTRAVENOUS
  Filled 2020-09-12 (×3): qty 1

## 2020-09-12 MED ORDER — ASPIRIN 81 MG PO CHEW
81.0000 mg | CHEWABLE_TABLET | Freq: Every day | ORAL | Status: DC
  Administered 2020-09-13 – 2020-09-16 (×4): 81 mg via ORAL
  Filled 2020-09-12 (×4): qty 1

## 2020-09-12 MED ORDER — LABETALOL HCL 5 MG/ML IV SOLN: 10.0000 mg | INTRAVENOUS | Status: AC | PRN

## 2020-09-12 MED ORDER — HEPARIN SODIUM (PORCINE) 1000 UNIT/ML IJ SOLN
INTRAMUSCULAR | Status: DC | PRN
  Administered 2020-09-12: 3000 [IU] via INTRAVENOUS
  Administered 2020-09-12: 7500 [IU] via INTRAVENOUS
  Administered 2020-09-12 (×2): 2000 [IU] via INTRAVENOUS

## 2020-09-12 MED ORDER — IOHEXOL 350 MG/ML SOLN
INTRAVENOUS | Status: DC | PRN
  Administered 2020-09-12: 140 mL

## 2020-09-12 MED ORDER — TICAGRELOR 90 MG PO TABS
ORAL_TABLET | ORAL | Status: DC | PRN
  Administered 2020-09-12: 180 mg via ORAL

## 2020-09-12 MED ORDER — MIDAZOLAM HCL 2 MG/2ML IJ SOLN
INTRAMUSCULAR | Status: AC
  Filled 2020-09-12: qty 2

## 2020-09-12 MED ORDER — LIDOCAINE HCL (PF) 1 % IJ SOLN
INTRAMUSCULAR | Status: AC
  Filled 2020-09-12: qty 30

## 2020-09-12 MED ORDER — INSULIN GLARGINE 100 UNIT/ML ~~LOC~~ SOLN
35.0000 [IU] | Freq: Two times a day (BID) | SUBCUTANEOUS | Status: DC
  Administered 2020-09-12 – 2020-09-14 (×5): 35 [IU] via SUBCUTANEOUS
  Filled 2020-09-12 (×8): qty 0.35

## 2020-09-12 MED ORDER — ONDANSETRON HCL 4 MG/2ML IJ SOLN
4.0000 mg | Freq: Four times a day (QID) | INTRAMUSCULAR | Status: DC | PRN
  Filled 2020-09-12: qty 2

## 2020-09-12 MED ORDER — NITROGLYCERIN 1 MG/10 ML FOR IR/CATH LAB
INTRA_ARTERIAL | Status: AC
  Filled 2020-09-12: qty 10

## 2020-09-12 MED ORDER — INSULIN ASPART 100 UNIT/ML ~~LOC~~ SOLN
0.0000 [IU] | Freq: Every day | SUBCUTANEOUS | Status: DC
  Administered 2020-09-13 – 2020-09-14 (×2): 3 [IU] via SUBCUTANEOUS
  Administered 2020-09-15: 2 [IU] via SUBCUTANEOUS

## 2020-09-12 MED ORDER — SODIUM CHLORIDE 0.9 % WEIGHT BASED INFUSION
3.0000 mL/kg/h | INTRAVENOUS | Status: DC
  Administered 2020-09-12: 3 mL/kg/h via INTRAVENOUS

## 2020-09-12 MED ORDER — ASPIRIN 81 MG PO CHEW
81.0000 mg | CHEWABLE_TABLET | ORAL | Status: DC
Start: 2020-09-13 — End: 2020-09-12

## 2020-09-12 MED ORDER — SODIUM CHLORIDE 0.9 % IV SOLN: 250.0000 mL | INTRAVENOUS | Status: DC | PRN

## 2020-09-12 MED ORDER — INSULIN ASPART 100 UNIT/ML ~~LOC~~ SOLN
0.0000 [IU] | Freq: Three times a day (TID) | SUBCUTANEOUS | Status: DC
  Administered 2020-09-13: 8 [IU] via SUBCUTANEOUS
  Administered 2020-09-13: 2 [IU] via SUBCUTANEOUS
  Administered 2020-09-13: 3 [IU] via SUBCUTANEOUS
  Administered 2020-09-14 (×2): 8 [IU] via SUBCUTANEOUS
  Administered 2020-09-15: 3 [IU] via SUBCUTANEOUS
  Administered 2020-09-15: 8 [IU] via SUBCUTANEOUS

## 2020-09-12 MED ORDER — SODIUM CHLORIDE 0.9 % WEIGHT BASED INFUSION: 3.0000 mL/kg/h | INTRAVENOUS | Status: DC

## 2020-09-12 MED ORDER — MIDAZOLAM HCL 2 MG/2ML IJ SOLN
INTRAMUSCULAR | Status: DC | PRN
  Administered 2020-09-12: 2 mg via INTRAVENOUS

## 2020-09-12 SURGICAL SUPPLY — 29 items
BAG SNAP BAND KOVER 36X36 (MISCELLANEOUS) ×2 IMPLANT
BALLN SAPPHIRE 2.0X10 (BALLOONS) ×4
BALLN SAPPHIRE ~~LOC~~ 2.25X10 (BALLOONS) ×2 IMPLANT
BALLN WOLVERINE 2.00X10 (BALLOONS) ×2
BALLN WOLVERINE 2.00X6 (BALLOONS) ×4
BALLOON SAPPHIRE 2.0X10 (BALLOONS) ×2 IMPLANT
BALLOON WOLVERINE 2.00X10 (BALLOONS) ×1 IMPLANT
BALLOON WOLVERINE 2.00X6 (BALLOONS) ×2 IMPLANT
CATH INFINITI 5 FR IM (CATHETERS) ×2 IMPLANT
CATH INFINITI 5FR MULTPACK ANG (CATHETERS) ×2 IMPLANT
CATH LAUNCHER 6FR AL1 (CATHETERS) ×1 IMPLANT
CATH LAUNCHER 6FR JR4 (CATHETERS) ×2 IMPLANT
CATH VISTA GUIDE 6FR JR4 (CATHETERS) ×2 IMPLANT
CATHETER LAUNCHER 6FR AL1 (CATHETERS) ×2
CATHETER LAUNCHER 6FR LCB (CATHETERS) ×2 IMPLANT
COVER DOME SNAP 22 D (MISCELLANEOUS) ×2 IMPLANT
KIT ENCORE 26 ADVANTAGE (KITS) ×2 IMPLANT
KIT ESSENTIALS PG (KITS) ×2 IMPLANT
KIT HEART LEFT (KITS) ×2 IMPLANT
PACK CARDIAC CATHETERIZATION (CUSTOM PROCEDURE TRAY) ×2 IMPLANT
SHEATH PINNACLE 5F 10CM (SHEATH) ×2 IMPLANT
SHEATH PINNACLE 6F 10CM (SHEATH) ×2 IMPLANT
TRANSDUCER W/STOPCOCK (MISCELLANEOUS) ×2 IMPLANT
TUBING CIL FLEX 10 FLL-RA (TUBING) ×2 IMPLANT
WIRE ASAHI PROWATER 180CM (WIRE) ×4 IMPLANT
WIRE COUGAR XT STRL 190CM (WIRE) ×2 IMPLANT
WIRE EMERALD 3MM-J .035X150CM (WIRE) ×2 IMPLANT
WIRE EMERALD 3MM-J .035X260CM (WIRE) ×2 IMPLANT
WIRE HI TORQ WHISPER MS 190CM (WIRE) ×2 IMPLANT

## 2020-09-12 NOTE — Telephone Encounter (Signed)
Eugene [name]/Eugene [name]  I Saw him in hospital for ILD consult He has incidental covi. Please arrange fro opd fu with me 30 min slot in 3-6 weeks but 3-4 ideally.   Thanks  MR

## 2020-09-12 NOTE — Progress Notes (Signed)
Patient went to cath lab. Alert and oriented x 4, no acute distress noted, no complaints. VS stable.

## 2020-09-12 NOTE — Progress Notes (Signed)
ANTICOAGULATION CONSULT NOTE - Follow Up Consult  Pharmacy Consult for heparin Indication: chest pain/ACS and DVT  No Known Allergies  Patient Measurements: Height: 5\' 6"  (167.6 cm) Weight: 74 kg (163 lb 2.3 oz) IBW/kg (Calculated) : 63.8 Heparin Dosing Weight: 73.9 kg  Vital Signs: Temp: 98.1 F (36.7 C) (02/15 0409) Temp Source: Axillary (02/15 0409) BP: 109/70 (02/15 0409) Pulse Rate: 75 (02/15 0409)  Labs: Recent Labs    09/10/20 0142 09/10/20 0721 09/10/20 2032 09/11/20 0335 09/11/20 0439 09/11/20 0921 09/11/20 1042 09/11/20 1102 09/11/20 1912 09/12/20 0200 09/12/20 0345  HGB 11.5*  --   --   --  11.7*  --   --   --   --   --  11.4*  HCT 33.4*  --   --   --  36.6*  --   --   --   --   --  35.2*  PLT 174  --   --   --  172  --   --   --   --   --  181  HEPARINUNFRC 0.39  --   --  0.24*  --   --  0.35  --   --   --  0.30  CREATININE  --  2.15*  --   --  2.28*  --   --   --   --   --  2.22*  CKTOTAL  --  5,937*   < >  --   --  1,610*  --  4,843* 4,981* 4,836*  --   CKMB  --   --   --   --   --  33.2*  --   --  32.2* 32.4*  --   TROPONINIHS  --   --    < > 2,710*  --   --  2,496*  --  1,970* 1,996*  --    < > = values in this interval not displayed.    Estimated Creatinine Clearance: 29.9 mL/min (A) (by C-G formula based on SCr of 2.22 mg/dL (H)).   Medications:  Scheduled:  . aspirin EC  81 mg Oral Daily  . clopidogrel  75 mg Oral Daily  . cyanocobalamin  1,000 mcg Intramuscular Daily  . feeding supplement  237 mL Oral TID BM  . insulin aspart  0-15 Units Subcutaneous TID WC  . insulin aspart  0-5 Units Subcutaneous QHS  . insulin glargine  35 Units Subcutaneous BID  . methylPREDNISolone (SOLU-MEDROL) injection  40 mg Intravenous Daily  . metoprolol succinate  25 mg Oral Daily  . multivitamin with minerals  1 tablet Oral Daily  . pantoprazole  40 mg Oral Q1200  . sodium chloride flush  3 mL Intravenous Q12H   Infusions:  . sodium chloride 1 mL/kg/hr  (09/12/20 0751)  . cefTRIAXone (ROCEPHIN)  IV 2 g (09/12/20 1205)  . heparin 1,500 Units/hr (09/12/20 1258)    Assessment: 65 yo M initialy started on heparin for chest pain / angina, now found to also have RLE DVT.  Heparin level at low end of goal (0.3).  No bleeding or issues with line infusing per RN. Plan for cath later today so will leave at current rate pending post-cath [facility] plans.  Goal of Therapy:  Heparin level 0.3-0.7 units/ml Monitor platelets by anticoagulation protocol: Yes   Plan:  Continue heparin to 1500 units/hr. Daily heparin level and CBC while on heparin. F/u post-cath [facility] plans  Claudina Lick, PharmD PGY1 Acute Care Pharmacy Resident  09/12/2020 2:00 PM  Please check [email or web address] for unit-specific pharmacy phone numbers.

## 2020-09-12 NOTE — Evaluation (Signed)
Occupational Therapy Evaluation Patient Details Name: Eugene [name] MRN: [id number] DOB: 1956/07/04 Today's Date: 09/12/2020    History of Present Illness Pt is a 65 y.o. male admitted 09/08/20 with 1-2 wk h/o retrosternal chest pain and BLE weakness, as well as unintentional weight loss. Workup for NSTEMI, transaminitis, UTI; incidental COVID-19. (+) RLE DVT on 2/12. Head CT negative for acute injury. PMH includes CAD (s/p CABG, PCI; vfib arrest s/p ICD), CKD IV, HTN, DM.   Clinical Impression   PTA, pt lives with fiance and reports Independence in all ADLs, IADLs and mobility without AD. Pt currently working full time as Freight forwarder at [employer]. Pt presents now with deficits in cardiopulmonary tolerance, B LE strength and dynamic standing balance. Pt overall Min A for bed mobility, Min A for sit to stand from low surface (supervision from higher surface) with RW. Pt overall Supervision for LB ADLs to ensure safety and cue for rest breaks when HR rising (up to 145bpm after toileting task). Educated on energy conservation strategies to implement during ADLs including use of BSC as shower chair. Pt reports plan to purchase RW and already has toilet riser in place. Noted pt to undergo cardiac catheterization today and pending stable functional status, anticipate no OT services needed on discharge. Plan to progress endurance during ADLs within pt's tolerance with acute OT services.     Follow Up Recommendations  No OT follow up;Supervision - Intermittent    Equipment Recommendations  3 in 1 bedside commode;Other (comment) (RW)    Recommendations for Other Services       Precautions / Restrictions Precautions Precautions: Fall;Other (comment) Precaution Comments: monitor HR Restrictions Weight Bearing Restrictions: No      Mobility Bed Mobility Overal bed mobility: Needs Assistance Bed Mobility: Supine to Sit;Sit to Supine     Supine to sit: Modified independent (Device/Increase time);HOB  elevated Sit to supine: Min assist   General bed mobility comments: increased time, effort using bed rail. Has flat bed and no bedrail at home. Min A for R LE back into bed at end of session    Transfers Overall transfer level: Needs assistance Equipment used: Rolling walker (2 wheeled) Transfers: Sit to/from [facility] Sit to Stand: Min assist Stand pivot transfers: Min guard       General transfer comment: supervision for standing from elevated surface, Min A from low chair/toilet. min guard for turning to ensure safety, cues for RW sequencing    Balance Overall balance assessment: Needs assistance Sitting-balance support: Single extremity supported;Feet supported Sitting balance-Leahy Scale: Fair     Standing balance support: Single extremity supported;Bilateral upper extremity supported Standing balance-Leahy Scale: Poor Standing balance comment: Reliant on UE support                           ADL either performed or assessed with clinical judgement   ADL Overall ADL's : Needs assistance/impaired Eating/Feeding: Independent;Sitting   Grooming: Supervision/safety;Standing;Sitting;Wash/dry hands;Wash/dry face;Oral care;Brushing hair Grooming Details (indicate cue type and reason): Pt Modified Independent for grooming tasks seated (encouraged to use chair at sink at home if still fatigued). Pt Supervision in standing for washing hair to ensure safety, monitoring HR Upper Body Bathing: Independent;Sitting   Lower Body Bathing: Supervison/ safety;Sitting/lateral leans;Sit to/from stand   Upper Body Dressing : Independent;Sitting   Lower Body Dressing: Supervision/safety;Sit to/from stand;Sitting/lateral leans   Toilet Transfer: Min guard;Ambulation;Regular Toilet;Grab bars;RW Armed forces technical officer Details (indicate cue type and reason): min guard  for turning, use of grab bars due to low surface (now has toilet riser at home) Toileting- Clothing  Manipulation and Hygiene: Supervision/safety;Sit to/from stand;Sitting/lateral lean Toileting - Clothing Manipulation Details (indicate cue type and reason): Supervision for safety, monitoring of HR       General ADL Comments: Pt with minor limitations in endurance, B LE strength and dynamic standing balance and reliance on DME at this time to maintain safety during ADLs     Vision Baseline Vision/History: Wears glasses Wears Glasses: At all times Patient Visual Report: No change from baseline Vision Assessment?: No apparent visual deficits Additional Comments: able to read whiteboard in room with and without glasses     Perception     Praxis      Pertinent Vitals/Pain Pain Assessment: Faces Faces Pain Scale: Hurts a little bit Pain Location: B LE Pain Descriptors / Indicators: Sore Pain Intervention(s): Monitored during session;Limited activity within patient's tolerance     Hand Dominance Right   Extremity/Trunk Assessment Upper Extremity Assessment Upper Extremity Assessment: Overall WFL for tasks assessed   Lower Extremity Assessment Lower Extremity Assessment: Defer to PT evaluation   Cervical / Trunk Assessment Cervical / Trunk Assessment: Normal   Communication Communication Communication: No difficulties   Cognition Arousal/Alertness: Awake/alert Behavior During Therapy: WFL for tasks assessed/performed Overall Cognitive Status: Within Functional Limits for tasks assessed                                 General Comments: Very pleasant and cooperative   General Comments  HR up to 132bpm during grooming tasks standing at sink, 145bpm after toileting task. Educated on breathing, energy conservation, rest breaks to slow HR while slowly building endurance during daily tasks. Discussed use of BSC as shower chair at home    Exercises     Shoulder Instructions      Home Living Family/patient expects to be discharged to:: Private residence Living  Arrangements: Spouse/significant other Available Help at Discharge: Family;Available 24 hours/day Type of Home: House Home Access: Stairs to enter;Ramped entrance Entrance Stairs-Number of Steps: 5 Entrance Stairs-Rails: Can reach both Home Layout: One level     Bathroom Shower/Tub: Occupational psychologist: Handicapped height     Home Equipment: Toilet riser          Prior Functioning/Environment Level of Independence: Independent        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[employer], active, has 2 dogs he enjoys being with (german shepherd and terrier mix)        OT Problem List: Decreased strength;Decreased activity tolerance;Impaired balance (sitting and/or standing);Decreased knowledge of use of DME or AE      OT Treatment/Interventions: Self-care/ADL training;Therapeutic exercise;Energy conservation;DME and/or AE instruction;Therapeutic activities;Patient/family education;Balance training    OT Goals(Current goals can be found in the care plan section) Acute Rehab OT Goals Patient Stated Goal: to improve strength and go home OT Goal Formulation: With patient Time For Goal Achievement: 09/26/20 Potential to Achieve Goals: Good ADL Goals Pt Will Perform Grooming: with modified independence;standing Pt Will Perform Lower Body Bathing: with modified independence;sitting/lateral leans;sit to/from stand Pt Will Perform Lower Body Dressing: with modified independence;sitting/lateral leans;sit to/from stand Pt Will Transfer to Toilet: with modified independence;ambulating Additional ADL Goal #1: Pt to increase functional activity tolerance > 5-7 min during ADLs with stable vitals  OT Frequency: Min 2X/week   Barriers to D/C:  Co-evaluation              AM-PAC OT "6 Clicks" Daily Activity     Outcome Measure Help from another person eating meals?: None Help from another person taking care of personal grooming?: A Little Help from another person toileting,  which includes using toliet, bedpan, or urinal?: A Little Help from another person bathing (including washing, rinsing, drying)?: A Little Help from another person to put on and taking off regular upper body clothing?: None Help from another person to put on and taking off regular lower body clothing?: A Little 6 Click Score: 20   End of Session Equipment Utilized During Treatment: Rolling walker Nurse Communication: Mobility status  Activity Tolerance: Patient tolerated treatment well Patient left: in bed;with call bell/phone within reach;with bed alarm set  OT Visit Diagnosis: Unsteadiness on feet (R26.81);Other abnormalities of gait and mobility (R26.89);Muscle weakness (generalized) (M62.81)                Time: 1443-6016 OT Time Calculation (min): 44 min Charges:  OT General Charges $OT Visit: 1 Visit OT Evaluation $OT Eval Moderate Complexity: 1 Mod OT Treatments $Self Care/Home Management : 23-37 mins  Layla Maw, OTR/L  Layla Maw 09/12/2020, 9:32 AM

## 2020-09-12 NOTE — Telephone Encounter (Signed)
Pt is still currently in hospital. I have scheduled pt for a hospital follow up appt with MR. Once pt is discharged from the hospital, pt's OV will show up on the discharge paperwork.  If appt needs to be changed/rescheduled, pt can call office at [phone] to get the appt changed. Nothing further needed.

## 2020-09-12 NOTE — Progress Notes (Addendum)
PROGRESS NOTE                                                                                                                                                                                                             Patient Demographics:    Eugene [name], is a 65 y.o. male, DOB - 11-27-1955, LDJ:[id number]  Outpatient Primary MD for the patient is [name], MD   Admit date - 09/08/2020   LOS - 3  Chief Complaint  Patient presents with  . Weakness       Brief Narrative: Patient is a 65 y.o. male with PMHx of history of CAD s/p CABG-subsequent recent PCI in 06/2020-V. fib arrest-s/p ICD implantation, CKD stage IV, HTN, DM, HLD who presented to the hospital with 1-2-week history of retrosternal chest pain responsive to nitroglycerin tablets-and 1 week history of bilateral lower extremity weakness.  Per patient-approximately 6 months ago-he noted that he was having trouble walking up a flight of stairs-and had actually put a ramp to get to his house from his garage.  He also acknowledges having unintentional weight loss-around 40-50 pounds in the past 1 year  COVID-19 vaccinated status: Vaccinated-J&J vaccine-but not boosted.  Significant Events: 2/11>> Admit to [facility] for non-STEMI-and bilateral lower extremity weakness.  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   Assessment  & Plan :   Non-STEMI: Chest pain-free-continue medical management with aspirin/Plavix/beta-blocker/IV heparin.  Statins on hold due to significantly elevated CK levels.  Has extensive history of CAD-s/p CABG-and most recent PCI in December 2021.  Cards following, due for left heart cath on 09/12/2020.  Bilateral lower extremity weakness with ++ CK & +++ESR: His CK is elevated but this could be due to his NSTEMI, I think there is a good  chance he might have polymyalgia rheumatica however elevated CK levels are muddying the picture, worthwhile to give him a steroid trial, no headache or vision problems, no jaw pain.  Also low B12 levels for which he is on replacement.  Outpatient surgery follow-up for muscle biopsy if needed.  Will definitely require outpatient rheumatology follow-up.  Possible pulmonary fibrosis noted on CT scan.  Likely chronic, pulmonary following, multiple serologies ordered so far double-stranded DNA is positive, ANA is pending, outpatient pulmonary follow-up with [name] post discharge.  incidental Covid infection with evidence of interstitial lung disease on imaging.  Asymptomatic from COVID-19 standpoint, pulmonary following defer work-up of pulmonary fibrosis to pulmonary.    Acute lower extremity DVT.  For now heparin drip.  Transaminitis: Likely related to rhabdomyolysis-CK elevation.  No RUQ pain.  Complicated UTI: Growing Klebsiella total 5 days of IV antibiotics, stop date 09/14/2020.  Unintentional 40-50 pound weight loss in the past 1 year: Concern for undiagnosed malignancy-apparently was supposed to get colonoscopy-but due to recent PCI/dual antiplatelet agents this was placed on hold. Noncontrast CT abdomen/pelvis without any obvious malignancy.  Patient PCP follow-up and age-appropriate work-up.  AKI versus CKD stage  IV: Creatinine 2 years ago was under 1, currently creatinine gently coming down with hydration which will be continued due to elevated CK levels, outpatient nephrology follow-up post discharge.  Hypokalemia: Repleted.  B12 deficiency.  Placed on SQ replacement.    DM-2 (A1c 7.7 on 07/06/2020): On Lantus to 60 units, continue SSI. Follow and adjust.   Recent Labs    09/11/20 1655 09/11/20 2001 09/12/20 0733  GLUCAP 190* 149* 107*   COVID-19 infection: Incidental-asymptomatic-Remdesivir x3 days.  Lab Results  Component Value Date   SARSCOV2NAA POSITIVE (A) 09/09/2020   [place] NEGATIVE 07/05/2020      GI prophylaxis: PPI  Condition - Guarded  Family Communication  :  [name]-Significant other-[phone]-updated on 09/10/20,   Code Status :  Full Code  Diet :  Diet Order            Diet NPO time specified Except for: Sips with Meds  Diet effective midnight                  Disposition Plan  :   Status is: Inpatient  Remains inpatient appropriate because:Inpatient level of care appropriate due to severity of illness   Dispo: The patient is from: Home              Anticipated d/c is to: TBD              Anticipated d/c date is: > 3 days              Patient currently is not medically stable to d/c.   Difficult to place patient No  Barriers to discharge: Hypoxia requiring O2 supplementation/complete 5 days of IV Remdesivir  Antimicorbials  :    Anti-infectives (From admission, onward)   Start     Dose/Rate Route Frequency Ordered Stop   09/09/20 1300  cefTRIAXone (ROCEPHIN) 2 g in sodium chloride 0.9 % 100 mL IVPB        2 g 200 mL/hr over 30 Minutes Intravenous Every 24 hours 09/09/20 1109        Inpatient Medications  Scheduled Meds: . aspirin EC  81 mg Oral Daily  . clopidogrel  75 mg Oral Daily  . cyanocobalamin  1,000 mcg Intramuscular Daily  . feeding supplement  237 mL Oral TID BM  . insulin  aspart  0-15 Units Subcutaneous TID WC  . insulin  aspart  0-5 Units Subcutaneous QHS  . insulin glargine  35 Units Subcutaneous BID  . methylPREDNISolone (SOLU-MEDROL) injection  40 mg Intravenous Daily  . metoprolol succinate  25 mg Oral Daily  . multivitamin with minerals  1 tablet Oral Daily  . pantoprazole  40 mg Oral Q1200  . sodium chloride flush  3 mL Intravenous Q12H   Continuous Infusions: . sodium chloride 1 mL/kg/hr (09/12/20 0751)  . cefTRIAXone (ROCEPHIN)  IV 2 g (09/11/20 1211)  . heparin 1,500 Units/hr (09/12/20 0706)   PRN Meds:.acetaminophen, nitroGLYCERIN   Time Spent in minutes  25   See all Orders from today for further details   Lala Lund M.D on 09/12/2020 at 10:50 AM  To page go to [email or web address] - use universal password  [facility] -  Office  [phone]    Objective:   Vitals:   09/11/20 2348 09/12/20 0409 09/12/20 0448 09/12/20 0800  BP: 107/73 109/70    Pulse: 89 75    Resp: 17 20    Temp: 98 F (36.7 C) 98.1 F (36.7 C)    TempSrc: Axillary Axillary    SpO2: 98% 98%  100%  Weight:   74 kg   Height:        Wt Readings from Last 3 Encounters:  09/12/20 74 kg  08/04/20 80.2 kg  07/26/20 77.1 kg     Intake/Output Summary (Last 24 hours) at 09/12/2020 1050 Last data filed at 09/12/2020 7412 Gross per 24 hour  Intake 1296.47 ml  Output 3250 ml  Net -1953.53 ml     Physical Exam  Awake Alert, No new F.N deficits, Normal affect [place].AT,PERRAL Supple Neck,No JVD, No cervical lymphadenopathy appriciated.  Symmetrical Chest wall movement, Good air movement bilaterally, CTAB RRR,No Gallops, Rubs or new Murmurs, No Parasternal Heave +ve B.Sounds, Abd Soft, No tenderness, No organomegaly appriciated, No rebound - guarding or rigidity. No Cyanosis, Clubbing or edema, No new Rash or bruise    Data Review:    CBC Recent Labs  Lab 09/08/20 1823 09/09/20 0409 09/10/20 0142 09/11/20 0439 09/12/20 0345  WBC 13.2* 12.2* 12.1* 11.5* 11.4*  HGB 13.0 12.3* 11.5* 11.7* 11.4*   HCT 39.9 37.8* 33.4* 36.6* 35.2*  PLT 177 168 174 172 181  MCV 86.9 87.5 84.8 88.8 88.2  MCH 28.3 28.5 29.2 28.4 28.6  MCHC 32.6 32.5 34.4 32.0 32.4  RDW 14.3 14.4 14.6 14.8 14.7  LYMPHSABS 2.3  --   --   --   --   MONOABS 1.2*  --   --   --   --   EOSABS 0.1  --   --   --   --   BASOSABS 0.0  --   --   --   --     Chemistries  Recent Labs  Lab 09/08/20 1823 09/09/20 0409 09/09/20 1643 09/10/20 0721 09/11/20 0439 09/12/20 0345  NA 137 136  --  140 139 139  K 3.5 2.8*  --  3.3* 4.3 3.6  CL 102 103  --  106 110 109  CO2 19* 17*  --  23 20* 21*  GLUCOSE 134* 89  --  60* 169* 141*  BUN 55* 51*  --  42* 42* 38*  CREATININE 2.80* 2.56*  --  2.15* 2.28* 2.22*  CALCIUM 9.2 9.0  --  8.9 8.9 9.0  MG  --   --  1.8 2.6* 2.4 2.2  AST 208* 182*  --   --  146* 150*  ALT 173* 154*  --   --  150* 155*  ALKPHOS 58 46  --   --  54 57  BILITOT 1.6* 1.3*  --   --  0.9 0.8   ------------------------------------------------------------------------------------------------------------------ No results for input(s): CHOL, HDL, LDLCALC, TRIG, CHOLHDL, LDLDIRECT in the last 72 hours.  Lab Results  Component Value Date   HGBA1C 7.7 (H) 07/06/2020   ------------------------------------------------------------------------------------------------------------------ Recent Labs    09/09/20 1206  TSH 2.186   ------------------------------------------------------------------------------------------------------------------ Recent Labs    09/09/20 1206  VITAMINB12 91*    Coagulation profile No results for input(s): INR, PROTIME in the last 168 hours.  Recent Labs    09/11/20 0439 09/12/20 0345  DDIMER 0.75* 0.84*    Cardiac Enzymes Recent Labs  Lab 09/11/20 0921 09/11/20 1912 09/12/20 0200  CKMB 33.2* 32.2* 32.4*   ------------------------------------------------------------------------------------------------------------------    Component Value Date/Time   BNP 359.0 (H)  09/12/2020 0345    Micro Results Recent Results (from the past 240 hour(s))  Resp Panel by RT-PCR (Flu A&B, Covid) Nasopharyngeal Swab     Status: Abnormal   Collection Time: 09/09/20 12:32 AM   Specimen: Nasopharyngeal Swab; Nasopharyngeal(NP) swabs in vial transport medium  Result Value Ref Range Status   SARS Coronavirus 2 by RT PCR POSITIVE (A) NEGATIVE Final    Comment: RESULT CALLED TO, READ BACK BY AND VERIFIED WITH: Elige Radon RN 09/09/20 0224 JDW (NOTE) SARS-CoV-2 target nucleic acids are DETECTED.  The SARS-CoV-2 RNA is generally detectable in upper respiratory specimens during the acute phase of infection. Positive results are indicative of the presence of the identified virus, but do not rule out bacterial infection or co-infection with other pathogens not detected by the test. Clinical correlation with patient history and other diagnostic information is necessary to determine patient infection status. The expected result is Negative.  Fact Sheet for Patients: EntrepreneurPulse.com.au  Fact Sheet for Healthcare Providers: IncredibleEmployment.be  This test is not yet approved or cleared by the [place] FDA and  has been authorized for detection and/or diagnosis of SARS-CoV-2 by FDA under an Emergency Use Authorization (EUA).  This EUA will remain in effect (meaning this test can be [place] ed) for the duration of  the COVID-19 declaration under Section 564(b)(1) of the Act, 21 U.S.C. section 360bbb-3(b)(1), unless the authorization is terminated or revoked sooner.     Influenza A by PCR NEGATIVE NEGATIVE Final   Influenza B by PCR NEGATIVE NEGATIVE Final    Comment: (NOTE) The Xpert Xpress SARS-CoV-2/FLU/RSV plus assay is intended as an aid in the diagnosis of influenza from Nasopharyngeal swab specimens and should not be used as a sole basis for treatment. Nasal washings and aspirates are unacceptable for Xpert Xpress  SARS-CoV-2/FLU/RSV testing.  Fact Sheet for Patients: EntrepreneurPulse.com.au  Fact Sheet for Healthcare Providers: IncredibleEmployment.be  This test is not yet approved or cleared by the [place] FDA and has been authorized for detection and/or diagnosis of SARS-CoV-2 by FDA under an Emergency Use Authorization (EUA). This EUA will remain in effect (meaning this test can be used) for the duration of the COVID-19 declaration under Section 564(b)(1) of the Act, 21 U.S.C. section 360bbb-3(b)(1), unless the authorization is terminated or revoked.  Performed at [facility], [place] [address], [place], [place] [address]   Urine Culture     Status: Abnormal   Collection Time: 09/09/20 10:32 AM   Specimen: Urine, Random  Result  Value Ref Range Status   Specimen Description URINE, RANDOM  Final   Special Requests   Final    Normal Performed at [facility], [place] [address], [place], [place] [address]    Culture >=100,000 COLONIES/mL KLEBSIELLA PNEUMONIAE (A)  Final   Report Status 09/11/2020 FINAL  Final   Organism ID, Bacteria KLEBSIELLA PNEUMONIAE (A)  Final      Susceptibility   Klebsiella pneumoniae - MIC*    AMPICILLIN >=32 RESISTANT Resistant     CEFAZOLIN <=4 SENSITIVE Sensitive     CEFEPIME <=0.12 SENSITIVE Sensitive     CEFTRIAXONE <=0.25 SENSITIVE Sensitive     CIPROFLOXACIN <=0.25 SENSITIVE Sensitive     GENTAMICIN <=1 SENSITIVE Sensitive     IMIPENEM <=0.25 SENSITIVE Sensitive     NITROFURANTOIN 64 INTERMEDIATE Intermediate     TRIMETH/SULFA <=20 SENSITIVE Sensitive     AMPICILLIN/SULBACTAM 4 SENSITIVE Sensitive     PIP/TAZO <=4 SENSITIVE Sensitive     * >=100,000 COLONIES/mL KLEBSIELLA PNEUMONIAE    Radiology Reports CT ABDOMEN PELVIS WO CONTRAST  Result Date: 09/09/2020 CLINICAL DATA:  Weight loss, and intended. EXAM: CT ABDOMEN AND PELVIS WITHOUT CONTRAST TECHNIQUE: Multidetector CT imaging of the abdomen and  pelvis was performed following the standard protocol without IV contrast. COMPARISON:  None. FINDINGS: Lower chest: Coronary artery calcifications. Partially visualized cardiac defibrillator. Diffuse reticulations of the visualized lung bases as well as suggestion of emphysematous changes. Possible tiny hiatal hernia. Hepatobiliary: No focal liver abnormality. Several calcified gallstones within the gallbladder lumen. No gallbladder wall thickening or pericholecystic fluid. No biliary dilatation. Pancreas: No focal lesion. Normal pancreatic contour. No surrounding inflammatory changes. No main pancreatic ductal dilatation. Spleen: Normal in size without focal abnormality. Suggestion of a splenule (3:33). Adrenals/Urinary Tract: No adrenal nodule bilaterally. No nephrolithiasis, no hydronephrosis, and no contour-deforming renal mass. No ureterolithiasis or hydroureter. Urinary bladder wall emphysema. Stomach/Bowel: PO contrast reaches the mid small bowel. Stomach is within normal limits. No evidence of bowel wall thickening or dilatation. Appendix appears normal. Vascular/Lymphatic: No abdominal aorta or iliac aneurysm. Mild atherosclerotic plaque of the aorta and its branches. No abdominal, pelvic, or inguinal lymphadenopathy. Reproductive: Prostate is unremarkable. Other: No intraperitoneal free fluid. No intraperitoneal free gas. No organized fluid collection. Musculoskeletal: No abdominal wall hernia or abnormality No suspicious lytic or blastic osseous lesions. No acute displaced fracture. Multilevel degenerative changes of the spine. IMPRESSION: 1. Emphysematous cystitis. 2. Please note limited evaluation of the abdomen and pelvis due to noncontrast study. 3. Other imaging findings of potential clinical significance: Fibrosis and emphysematous changes of the lungs. Aortic Atherosclerosis (ICD10-I70.0). Electronically Signed   By: Iven Finn M.D.   On: 09/09/2020 03:09   CT HEAD WO CONTRAST  Result  Date: 09/09/2020 CLINICAL DATA:  Neurological deficit.  Acute stroke suspected. EXAM: CT HEAD WITHOUT CONTRAST TECHNIQUE: Contiguous axial images were obtained from the base of the skull through the vertex without intravenous contrast. COMPARISON:  06/01/2014 FINDINGS: Brain: Mild to moderate generalized brain volume loss. Numerous areas of low-density in the hemispheric white matter and basal ganglia consistent with chronic small vessel disease. No sign of acute infarction, mass lesion, hemorrhage, hydrocephalus or extra-axial collection. Vascular: There is atherosclerotic calcification of the major vessels at the base of the brain. Skull: Negative Sinuses/Orbits: Clear/normal Other: None IMPRESSION: No acute finding by CT. Atrophy and chronic small-vessel ischemic changes of the white matter and basal ganglia. Electronically Signed   By: Nelson Chimes M.D.   On: 09/09/2020 00:55  CT CHEST WO CONTRAST  Result Date: 09/09/2020 CLINICAL DATA:  COVID positive. Generalized weakness. Retrosternal chest pain. EXAM: CT CHEST WITHOUT CONTRAST TECHNIQUE: Multidetector CT imaging of the chest was performed following the standard protocol without IV contrast. COMPARISON:  Chest radiograph from one day prior. FINDINGS: Cardiovascular: Top-normal heart size. Single lead left subclavian ICD with lead tip in the right ventricular apex. No significant pericardial effusion/thickening. Three-vessel coronary atherosclerosis status post CABG. Atherosclerotic nonaneurysmal thoracic aorta. Normal caliber pulmonary arteries. Mediastinum/Nodes: No discrete thyroid nodules. Unremarkable esophagus. No pathologically enlarged axillary, mediastinal or hilar lymph nodes, noting limited sensitivity for the detection of hilar adenopathy on this noncontrast study. Lungs/Pleura: No pneumothorax. No pleural effusion. Mild centrilobular emphysema. Solid 5 mm right middle lobe pulmonary nodule along the minor fissure (series 5/image 75). No acute  consolidative airspace disease, lung masses or additional significant pulmonary nodules. Extensive patchy confluent peripheral reticulation and ground-glass opacity throughout both lungs with associated moderate traction bronchiectasis, architectural distortion and volume loss. There is a basilar predominance to these findings. No frank honeycombing. Upper abdomen: Cholelithiasis. Musculoskeletal: No aggressive appearing focal osseous lesions. Intact sternotomy wires. Marked thoracic spondylosis. IMPRESSION: 1. 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 Suggest outpatient pulmonology consultation and follow-up high-resolution chest CT study in 6-12 months. Findings are categorized as probable UIP per consensus guidelines: Diagnosis of Idiopathic Pulmonary Fibrosis: An Official ATS/ERS/JRS/ALAT Clinical Practice Guideline. [place], Iss 5, [phone], Mar 29 2017. 2. Solid 5 mm right middle lobe pulmonary nodule along the minor fissure. Follow-up noncontrast chest CT recommended in 12 months in this high risk patient. This recommendation follows the consensus statement: Guidelines for Management of Incidental Pulmonary Nodules Detected on CT Images: From the Fleischner Society 2017; Radiology 2017; 284:228-243. 3. Cholelithiasis. 4. Aortic Atherosclerosis (ICD10-I70.0) and Emphysema (ICD10-J43.9). Electronically Signed   By: Ilona Sorrel M.D.   On: 09/09/2020 12:08   DG Chest Portable 1 View  Result Date: 09/08/2020 CLINICAL DATA:  Chest pain. EXAM: PORTABLE CHEST 1 VIEW COMPARISON:  September 12, 2010 FINDINGS: A multi lead AICD is noted. Multiple sternal wires are seen. The heart size and mediastinal contours are within normal limits. A radiopaque loop recorder device is seen. There is no evidence of acute infiltrate, pleural effusion or pneumothorax. Degenerative changes are seen throughout the thoracic spine.  IMPRESSION: Stable exam without acute or active cardiopulmonary disease. Electronically Signed   By: Virgina Norfolk M.D.   On: 09/08/2020 18:50   VAS [place] LOWER EXTREMITY VENOUS (DVT) (ONLY [facility] & [facility])  Result Date: 09/09/2020  Lower Venous DVT Study Indications: Covid-19.  Limitations: Shadowing from arterial plaque, clothing. Comparison Study: No prior study Performing Technologist: Sharion Dove RVS  Examination Guidelines: A complete evaluation includes B-mode imaging, spectral Doppler, color Doppler, and power Doppler as needed of all accessible portions of each vessel. Bilateral testing is considered an integral part of a complete examination. Limited examinations for reoccurring indications may be performed as noted. The reflux portion of the exam is performed with the patient in reverse Trendelenburg.  +---------+---------------+---------+-----------+----------+-------------------+ RIGHT    CompressibilityPhasicitySpontaneityPropertiesThrombus Aging      +---------+---------------+---------+-----------+----------+-------------------+ CFV                                                   Not well visualized +---------+---------------+---------+-----------+----------+-------------------+  SFJ                                                   Not well visualized +---------+---------------+---------+-----------+----------+-------------------+ FV Prox  Full           Yes      Yes                                      +---------+---------------+---------+-----------+----------+-------------------+ FV Mid   Full                                                             +---------+---------------+---------+-----------+----------+-------------------+ FV DistalFull                                                             +---------+---------------+---------+-----------+----------+-------------------+ PFV                                                   Not well  visualized +---------+---------------+---------+-----------+----------+-------------------+ POP      Partial        Yes      Yes                  Acute               +---------+---------------+---------+-----------+----------+-------------------+ PTV      Full                                                             +---------+---------------+---------+-----------+----------+-------------------+ PERO     None                                         Acute               +---------+---------------+---------+-----------+----------+-------------------+ Gastroc  None                                         Acute               +---------+---------------+---------+-----------+----------+-------------------+   +---------+---------------+---------+-----------+----------+-------------------+ LEFT     CompressibilityPhasicitySpontaneityPropertiesThrombus Aging      +---------+---------------+---------+-----------+----------+-------------------+ CFV  Not well visualized +---------+---------------+---------+-----------+----------+-------------------+ SFJ                                                   Not well visualized +---------+---------------+---------+-----------+----------+-------------------+ FV Prox  Full           Yes      Yes                                      +---------+---------------+---------+-----------+----------+-------------------+ FV Mid   Full                                                             +---------+---------------+---------+-----------+----------+-------------------+ FV DistalFull           Yes      Yes                                      +---------+---------------+---------+-----------+----------+-------------------+ PFV                                                   Not well visualized  +---------+---------------+---------+-----------+----------+-------------------+ POP      Full           Yes      Yes                                      +---------+---------------+---------+-----------+----------+-------------------+ PTV      Full                                                             +---------+---------------+---------+-----------+----------+-------------------+ PERO     Full                                                             +---------+---------------+---------+-----------+----------+-------------------+    Summary: RIGHT: - Findings consistent with acute deep vein thrombosis involving the right popliteal vein, right peroneal veins, and right gastrocnemius veins. - There is no evidence of deep vein thrombosis in the lower extremity. - There is no evidence of superficial venous thrombosis.  LEFT: - No evidence of common femoral vein obstruction.  *See table(s) above for measurements and observations. Electronically signed by Harold Barban MD on 09/09/2020 at 7:57:19 PM.    Final    ECHOCARDIOGRAM LIMITED  Result Date: 09/10/2020    ECHOCARDIOGRAM LIMITED REPORT   Patient Name:   MAGUIRE SIME [facility] Date of  Exam: 09/09/2020 Medical Rec #:  [id number]    Height:       66.0 in Accession #:    [id number]   Weight:       163.0 lb Date of Birth:  [birth year]   BSA:          1.833 m Patient Age:    28 years     BP:           99/66 mmHg Patient Gender: M            HR:           81 bpm. Exam Location:  Inpatient Procedure: Limited Echo, Limited Color Doppler and Cardiac Doppler Indications:    NSTEMI  History:        Patient has prior history of Echocardiogram examinations, most                 recent 08/16/2020. Defibrillator and Prior CABG, Covid. chronic                 kidney disease; Risk Factors:Diabetes.  Sonographer:    Johny Chess Referring Phys: [id number] [place]  1. Left ventricular ejection fraction, by estimation, is 50 to 55%. The left  ventricle has low normal function. The left ventricle has no regional wall motion abnormalities. Left ventricular diastolic parameters are consistent with Grade I diastolic dysfunction (impaired relaxation).  2. Right ventricular systolic function is normal.  3. The mitral valve is normal in structure. No evidence of mitral valve regurgitation.  4. The aortic valve is tricuspid. There is mild calcification of the aortic valve. There is mild thickening of the aortic valve. Aortic valve regurgitation is not visualized. No aortic stenosis is present. Comparison(s): A prior study was performed on 08/16/2020. No significant change from prior study. Prior images reviewed side by side. FINDINGS  Left Ventricle: Left ventricular ejection fraction, by estimation, is 50 to 55%. The left ventricle has low normal function. The left ventricle has no regional wall motion abnormalities. Abnormal (paradoxical) septal motion, consistent with RV pacemaker. Left ventricular diastolic parameters are consistent with Grade I diastolic dysfunction (impaired relaxation). Right Ventricle: Right ventricular systolic function is normal. Pericardium: Trivial pericardial effusion is present. Mitral Valve: The mitral valve is normal in structure. Tricuspid Valve: The tricuspid valve is grossly normal. Tricuspid valve regurgitation is not demonstrated. Aortic Valve: The aortic valve is tricuspid. There is mild calcification of the aortic valve. There is mild thickening of the aortic valve. Aortic valve regurgitation is not visualized. No aortic stenosis is present. Aorta: The aortic root and ascending aorta are structurally normal, with no evidence of dilitation. Additional Comments: A pacer wire is visualized. LEFT VENTRICLE PLAX 2D LVIDd:         4.60 cm     Diastology LVIDs:         3.70 cm     LV e' medial:    5.55 cm/s LV PW:         1.00 cm     LV E/e' medial:  11.9 LV IVS:        0.80 cm     LV e' lateral:   6.53 cm/s                             LV E/e' lateral: 10.1  LV Volumes (MOD) LV vol d, MOD A2C: 67.7 ml LV vol s, MOD A2C: 40.4 ml LV SV  MOD A2C:     27.3 ml IVC IVC diam: 1.50 cm LEFT ATRIUM         Index LA diam:    3.80 cm 2.07 cm/m  AORTIC VALVE LVOT Vmax:   70.30 cm/s LVOT Vmean:  45.000 cm/s LVOT VTI:    0.126 m  AORTA Ao Asc diam: 2.90 cm MITRAL VALVE MV Area (PHT): 4.63 cm     SHUNTS MV Decel Time: 164 msec     Systemic VTI: 0.13 m MV E velocity: 66.00 cm/s MV A velocity: 105.00 cm/s MV E/A ratio:  0.63 Rudean Haskell MD Electronically signed by Rudean Haskell MD Signature Date/Time: 09/10/2020/12:10:15 PM    Final    ECHOCARDIOGRAM LIMITED  Result Date: 08/16/2020    ECHOCARDIOGRAM LIMITED REPORT   Patient Name:   [facility]  Date of Exam: 08/16/2020 Medical Rec #:  [id number]     Height:       66.0 in Accession #:    [id number]    Weight:       176.8 lb Date of Birth:  1955/08/15    BSA:          1.898 m Patient Age:    32 years      BP:           111/67 mmHg Patient Gender: M             HR:           71 bpm. Exam Location:  [facility] Procedure: Limited Echo, Limited Color Doppler, Cardiac Doppler and Intracardiac            Opacification Agent Indications:    I50.9 CHF  History:        Patient has prior history of Echocardiogram examinations, most                 recent 07/06/2020. CHF, Previous Myocardial Infarction and CAD,                 Prior CABG, Stroke; Risk Factors:Hypertension, HLD, Diabetes and                 Dyslipidemia.  Sonographer:    Marygrace Drought RCS Referring Phys: [id number] [place] A CHANDRASEKHAR IMPRESSIONS  1. Left ventricular ejection fraction, by estimation, is 50 to 55%. The left ventricle has low normal function. The left ventricle has no regional wall motion abnormalities.  2. Right ventricular systolic function is normal. The right ventricular size is normal. There is normal pulmonary artery systolic pressure.  3. Left atrial size was mildly dilated.  4. The pericardial effusion is  posterior and lateral to the left ventricle.  5. The mitral valve is normal in structure. No evidence of mitral valve regurgitation. No evidence of mitral stenosis.  6. The aortic valve is normal in structure. Aortic valve regurgitation is not visualized. No aortic stenosis is present.  7. The inferior vena cava is normal in size with greater than 50% respiratory variability, suggesting right atrial pressure of 3 mmHg. FINDINGS  Left Ventricle: Left ventricular ejection fraction, by estimation, is 50 to 55%. The left ventricle has low normal function. The left ventricle has no regional wall motion abnormalities. The left ventricular internal cavity size was normal in size. There is no left ventricular hypertrophy. Right Ventricle: The right ventricular size is normal. No increase in right ventricular wall thickness. Right ventricular systolic function is normal. There is normal pulmonary artery systolic pressure. The tricuspid regurgitant velocity is  2.13 m/s, and  with an assumed right atrial pressure of 3 mmHg, the estimated right ventricular systolic pressure is 09.6 mmHg. Left Atrium: Left atrial size was mildly dilated. Right Atrium: Right atrial size was normal in size. Pericardium: Trivial pericardial effusion is present. The pericardial effusion is posterior and lateral to the left ventricle. Mitral Valve: The mitral valve is normal in structure. There is mild thickening of the mitral valve leaflet(s). There is mild calcification of the mitral valve leaflet(s). Mild mitral annular calcification. No evidence of mitral valve stenosis. Tricuspid Valve: The tricuspid valve is normal in structure. Tricuspid valve regurgitation is not demonstrated. No evidence of tricuspid stenosis. Aortic Valve: The aortic valve is normal in structure. Aortic valve regurgitation is not visualized. No aortic stenosis is present. Pulmonic Valve: The pulmonic valve was normal in structure. Pulmonic valve regurgitation is not  visualized. No evidence of pulmonic stenosis. Aorta: The aortic root is normal in size and structure. Venous: The inferior vena cava is normal in size with greater than 50% respiratory variability, suggesting right atrial pressure of 3 mmHg. IAS/Shunts: No atrial level shunt detected by color flow Doppler. Additional Comments: A pacer wire is visualized. LEFT VENTRICLE PLAX 2D LVIDd:         4.10 cm LVIDs:         2.90 cm LV PW:         1.40 cm LV IVS:        0.80 cm LVOT diam:     2.20 cm LVOT Area:     3.80 cm  RIGHT VENTRICLE RVSP:           21.1 mmHg LEFT ATRIUM         Index      RIGHT ATRIUM LA diam:    3.90 cm 2.06 cm/m RA Pressure: 3.00 mmHg   AORTA Ao Root diam: 3.40 cm Ao Asc diam:  2.80 cm MITRAL VALVE                TRICUSPID VALVE MV Area (PHT):              TR Peak grad:   18.1 mmHg MV Decel Time:              TR Vmax:        213.00 cm/s MV E velocity: 85.70 cm/s   Estimated RAP:  3.00 mmHg MV A velocity: 109.00 cm/s  RVSP:           21.1 mmHg MV E/A ratio:  0.79                             SHUNTS                             Systemic Diam: 2.20 cm Jenkins Rouge MD Electronically signed by Jenkins Rouge MD Signature Date/Time: 08/16/2020/9:55:39 AM    Final

## 2020-09-12 NOTE — Interval H&P Note (Signed)
Cath Lab Visit (complete for each Cath Lab visit)  Clinical Evaluation Leading to the Procedure:   ACS: Yes.    Non-ACS:    Anginal Classification: CCS III  Anti-ischemic medical therapy: Minimal Therapy (1 class of medications)  Non-Invasive Test Results: No non-invasive testing performed  Prior CABG: Previous CABG      History and Physical Interval Note:  09/12/2020 3:14 PM  Eugene [name]  has presented today for surgery, with the diagnosis of nstemi.  The various methods of treatment have been discussed with the patient and family. After consideration of risks, benefits and other options for treatment, the patient has consented to  Procedure(s): LEFT HEART CATH AND CORONARY ANGIOGRAPHY (N/A) as a surgical intervention.  The patient's history has been reviewed, patient examined, no change in status, stable for surgery.  I have reviewed the patient's chart and labs.  Questions were answered to the patient's satisfaction.     Shelva Majestic

## 2020-09-12 NOTE — Progress Notes (Signed)
PT Cancellation Note  Patient Details Name: JOZEPH PERSING MRN: [id number] DOB: [birth year]   Cancelled Treatment:    Reason Eval/Treat Not Completed: Patient at procedure or test/unavailable (cath lab). Will follow-up for PT treatment tomorrow.  Mabeline Caras, PT, DPT Acute Rehabilitation Services  Pager [phone] Office [place] 09/12/2020, 3:48 PM

## 2020-09-13 ENCOUNTER — Encounter (HOSPITAL_COMMUNITY): Payer: Self-pay | Admitting: Cardiovascular Disease

## 2020-09-13 DIAGNOSIS — I214 Non-ST elevation (NSTEMI) myocardial infarction: Secondary | ICD-10-CM | POA: Diagnosis not present

## 2020-09-13 LAB — CK TOTAL AND CKMB (NOT AT ARMC)
CK, MB: 28.6 ng/mL — ABNORMAL HIGH (ref 0.5–5.0)
Relative Index: 0.9 (ref 0.0–2.5)
Total CK: 3319 U/L — ABNORMAL HIGH (ref 49–397)

## 2020-09-13 LAB — GLUCOSE, CAPILLARY
Glucose-Capillary: 130 mg/dL — ABNORMAL HIGH (ref 70–99)
Glucose-Capillary: 168 mg/dL — ABNORMAL HIGH (ref 70–99)
Glucose-Capillary: 285 mg/dL — ABNORMAL HIGH (ref 70–99)
Glucose-Capillary: 291 mg/dL — ABNORMAL HIGH (ref 70–99)

## 2020-09-13 LAB — CBC
HCT: 36.5 % — ABNORMAL LOW (ref 39.0–52.0)
Hemoglobin: 12.3 g/dL — ABNORMAL LOW (ref 13.0–17.0)
MCH: 29.2 pg (ref 26.0–34.0)
MCHC: 33.7 g/dL (ref 30.0–36.0)
MCV: 86.7 fL (ref 80.0–100.0)
Platelets: 249 10*3/uL (ref 150–400)
RBC: 4.21 MIL/uL — ABNORMAL LOW (ref 4.22–5.81)
RDW: 15.4 % (ref 11.5–15.5)
WBC: 22.3 10*3/uL — ABNORMAL HIGH (ref 4.0–10.5)
nRBC: 0 % (ref 0.0–0.2)

## 2020-09-13 LAB — C-REACTIVE PROTEIN: CRP: 1.7 mg/dL — ABNORMAL HIGH (ref <1.0)

## 2020-09-13 LAB — HYPERSENSITIVITY PNEUMONITIS
A. Pullulans Abs: NEGATIVE
A.Fumigatus #1 Abs: NEGATIVE
Micropolyspora faeni, IgG: NEGATIVE
Pigeon Serum Abs: NEGATIVE
Thermoact. Saccharii: NEGATIVE
Thermoactinomyces vulgaris, IgG: NEGATIVE

## 2020-09-13 LAB — COMPREHENSIVE METABOLIC PANEL
ALT: 161 U/L — ABNORMAL HIGH (ref 0–44)
AST: 130 U/L — ABNORMAL HIGH (ref 15–41)
Albumin: 2.6 g/dL — ABNORMAL LOW (ref 3.5–5.0)
Alkaline Phosphatase: 60 U/L (ref 38–126)
Anion gap: 14 (ref 5–15)
BUN: 42 mg/dL — ABNORMAL HIGH (ref 8–23)
CO2: 14 mmol/L — ABNORMAL LOW (ref 22–32)
Calcium: 9.3 mg/dL (ref 8.9–10.3)
Chloride: 111 mmol/L (ref 98–111)
Creatinine, Ser: 2.04 mg/dL — ABNORMAL HIGH (ref 0.61–1.24)
GFR, Estimated: 36 mL/min — ABNORMAL LOW (ref >60)
Glucose, Bld: 273 mg/dL — ABNORMAL HIGH (ref 70–99)
Potassium: 4.1 mmol/L (ref 3.5–5.1)
Sodium: 139 mmol/L (ref 135–145)
Total Bilirubin: 1.2 mg/dL (ref 0.3–1.2)
Total Protein: 6.6 g/dL (ref 6.5–8.1)

## 2020-09-13 LAB — SEDIMENTATION RATE: Sed Rate: 99 mm/hr — ABNORMAL HIGH (ref 0–16)

## 2020-09-13 LAB — TROPONIN I (HIGH SENSITIVITY): Troponin I (High Sensitivity): 2291 ng/L (ref <18)

## 2020-09-13 LAB — HEMOGLOBIN A1C
Hgb A1c MFr Bld: 9 % — ABNORMAL HIGH (ref 4.8–5.6)
Mean Plasma Glucose: 212 mg/dL

## 2020-09-13 LAB — BRAIN NATRIURETIC PEPTIDE: B Natriuretic Peptide: 569.4 pg/mL — ABNORMAL HIGH (ref 0.0–100.0)

## 2020-09-13 LAB — D-DIMER, QUANTITATIVE: D-Dimer, Quant: 1.03 ug/mL-FEU — ABNORMAL HIGH (ref 0.00–0.50)

## 2020-09-13 LAB — HEPARIN LEVEL (UNFRACTIONATED)
Heparin Unfractionated: <0.1 IU/mL — ABNORMAL LOW (ref 0.30–0.70)
Heparin Unfractionated: 0.27 IU/mL — ABNORMAL LOW (ref 0.30–0.70)

## 2020-09-13 LAB — MAGNESIUM: Magnesium: 2.2 mg/dL (ref 1.7–2.4)

## 2020-09-13 MED ORDER — ANGIOPLASTY BOOK
Freq: Once | Status: AC
  Filled 2020-09-13: qty 1

## 2020-09-13 MED ORDER — HEART ATTACK BOUNCING BOOK
Freq: Once | Status: AC
  Filled 2020-09-13: qty 1

## 2020-09-13 MED ORDER — WARFARIN - PHARMACIST DOSING INPATIENT: Freq: Every day | Status: DC

## 2020-09-13 MED ORDER — THE SENSUOUS HEART BOOK
Freq: Once | Status: AC
  Filled 2020-09-13: qty 1

## 2020-09-13 MED ORDER — HEPARIN (PORCINE) 25000 UT/250ML-% IV SOLN
1650.0000 [IU]/h | INTRAVENOUS | Status: DC
  Administered 2020-09-13 (×2): 1500 [IU]/h via INTRAVENOUS
  Filled 2020-09-13 (×4): qty 250

## 2020-09-13 MED ORDER — WARFARIN SODIUM 7.5 MG PO TABS
7.5000 mg | ORAL_TABLET | Freq: Once | ORAL | Status: AC
  Administered 2020-09-13: 7.5 mg via ORAL
  Filled 2020-09-13: qty 1

## 2020-09-13 NOTE — Progress Notes (Signed)
Inpatient Diabetes Program Recommendations  AACE/ADA: New Consensus Statement on Inpatient Glycemic Control (2015)  Target Ranges:  Prepandial:   less than 140 mg/dL      Peak postprandial:   less than 180 mg/dL (1-2 hours)      Critically ill patients:  140 - 180 mg/dL   Lab Results  Component Value Date   GLUCAP 168 (H) 09/13/2020   HGBA1C 9.0 (H) 09/12/2020    Review of Glycemic Control  Diabetes history: DM 2 Outpatient Diabetes medications: Metformin 500 mg bid, Farxiga 10 mg Daily, Lantus 70 uits Daily, Ozempic 1 mg QTuesdays Current orders for Inpatient glycemic control:  Lantus 35 units bid Novolog 0-15 units tid + hs  A1c 9% this admission Solumedrol 40 mg Daily  Spoke with patient over the phone about diabetes and home regimen for diabetes control. Patient reports that he is followed by Dr. [name] (Endorcrinology) for diabetes management. Patient reports that me forgets to take his medication occasionally, he alsop reports that he started to see Dr. Forde Dandy in Sept 2021 his A1c was a 9.5% he watched what he ate and brought his A1c down to a 7.1% in December. Pt reports he knows what to do to bring his A1c down. Pt reports he eats 2-3 candy bars in a week and when he goes out to eat likes the sweet potatoes ad potatoe soups. Pt also reports he struggles staying a the right diet when he works at [employer] around food and sweets all day.  Pt may need more insulin pen needles at time of d/c order # [id number].  Thanks,  Tama Headings RN, MSN, BC-ADM Inpatient Diabetes Coordinator Team Pager [phone] (8a-5p)

## 2020-09-13 NOTE — Progress Notes (Signed)
Site area: right groin  Site Prior to Removal:  Level 0  Pressure Applied For 20  MINUTES    Minutes Beginning at 2157  Manual:   Yes.    Patient Status During Pull:  Pt.AAO X4  Post Pull Groin Site:  Level 0  Post Pull Instructions Given:  Yes.    Post Pull Pulses Present:  Yes.    Dressing Applied:  Yes.    Comments:  Pt.tolerated procedure well;no hematoma noted.

## 2020-09-13 NOTE — Progress Notes (Signed)
Physical Therapy Treatment Patient Details Name: Eugene [name] MRN: [id number] DOB: 1956/04/28 Today's Date: 09/13/2020    History of Present Illness Pt is a 65 y.o. male admitted 09/08/20 with 1-2 wk h/o retrosternal chest pain and BLE weakness, as well as unintentional weight loss. Workup for NSTEMI, transaminitis, UTI; incidental COVID-19. (+) RLE DVT on 2/12. Head CT negative for acute injury. PMH includes CAD (s/p CABG, PCI; vfib arrest s/p ICD), CKD IV, HTN, DM.    PT Comments    Pt admitted with above diagnosis. Pt was able to ambulate with RW in room and does get fatigued rather quickly. Progressing.  Pt currently with functional limitations due to balance and endurance deficits. Pt will benefit from skilled PT to increase their independence and safety with mobility to allow discharge to the venue listed below.     Follow Up Recommendations  Home health PT;Supervision for mobility/OOB     Equipment Recommendations  Rolling walker with 5" wheels;3in1 (PT) (TBD-pt may buy his own walker)    Recommendations for Other Services       Precautions / Restrictions Precautions Precautions: Fall;Other (comment) Precaution Comments: monitor HR Restrictions Weight Bearing Restrictions: No    Mobility  Bed Mobility               General bed mobility comments: Pt in bathroom on arrival to room    Transfers Overall transfer level: Needs assistance Equipment used: Rolling walker (2 wheeled) Transfers: Sit to/from [employer] Transfers Sit to Stand: Mod assist         General transfer comment: incr difficulty from low toilet even with grab bars.  Takes incr time.  Ambulation/Gait Ambulation/Gait assistance: Min guard Gait Distance (Feet): 105 Feet (80 feet then 25) Assistive device: Rolling walker (2 wheeled) Gait Pattern/deviations: Step-through pattern;Decreased stride length;Trunk flexed Gait velocity: Decreased Gait velocity interpretation: <1.31 ft/sec,  indicative of household ambulator General Gait Details: Slow, mostly steady gait with RW and min guard for balance; pt frequently running RW wheels into objects in crowded room, but able to self-correct;Pt intiially walked laps. then sat at sink to bathe, then walked to chair.   c/o R lower calf pain improving with mobility   Stairs             Wheelchair Mobility    Modified Rankin (Stroke Patients Only)       Balance Overall balance assessment: Needs assistance Sitting-balance support: Single extremity supported;Feet supported Sitting balance-Leahy Scale: Fair     Standing balance support: Single extremity supported;Bilateral upper extremity supported Standing balance-Leahy Scale: Poor Standing balance comment: Reliant on UE support at sink when performing bath and tooth brushing.  Able to complete full bath at sink. Nurse present as well.                            Cognition Arousal/Alertness: Awake/alert Behavior During Therapy: WFL for tasks assessed/performed Overall Cognitive Status: Within Functional Limits for tasks assessed                                 General Comments: Very pleasant and cooperative      Exercises General Exercises - Lower Extremity Long Arc Quad: AROM;Both;Seated    General Comments General comments (skin integrity, edema, etc.): VSS      Pertinent Vitals/Pain Pain Assessment: No/denies pain    Home Living  Prior Function            PT Goals (current goals can now be found in the care plan section) Acute Rehab PT Goals Patient Stated Goal: to improve strength and go home Progress towards PT goals: Progressing toward goals    Frequency    Min 3X/week      PT Plan Current plan remains appropriate    Co-evaluation              AM-PAC PT "6 Clicks" Mobility   Outcome Measure  Help needed turning from your back to your side while in a flat bed without using  bedrails?: A Little Help needed moving from lying on your back to sitting on the side of a flat bed without using bedrails?: A Little Help needed moving to and from a bed to a chair (including a wheelchair)?: A Little Help needed standing up from a chair using your arms (e.g., wheelchair or bedside chair)?: A Lot Help needed to walk in hospital room?: A Little Help needed climbing 3-5 steps with a railing? : A Lot 6 Click Score: 16    End of Session Equipment Utilized During Treatment: Gait belt Activity Tolerance: Patient tolerated treatment well Patient left: in chair;with call bell/phone within reach;with chair alarm set Nurse Communication: Mobility status PT Visit Diagnosis: Unsteadiness on feet (R26.81);Other abnormalities of gait and mobility (R26.89);Muscle weakness (generalized) (M62.81)     Time: 1572-6203 PT Time Calculation (min) (ACUTE ONLY): 41 min  Charges:  $Gait Training: 8-22 mins $Therapeutic Exercise: 8-22 mins $Self Care/Home Management: 8-22                     Iantha Titsworth W,PT Acute Rehabilitation Services Pager:  [phone]  Office:  [place] 09/13/2020, 1:59 PM

## 2020-09-13 NOTE — TOC Initial Note (Signed)
Transition of Care [facility]) - Initial/Assessment Note    Patient Details  Name: Eugene [name] MRN: [id number] Date of Birth: 1955-10-08  Transition of Care [facility]) CM/SW Contact:    Carles Collet, RN Phone Number: 09/13/2020, 2:20 PM  Clinical Narrative:              Damaris Schooner w patient over to phone to discuss DC planning.  PT recs for [place] RW, 3/1. Patient agreeable to [facility] services. Referral accepted by [facility] for [facility] PT. Please place [facility] order for PT only with face to face.     Patient states that he has shower seat and walker at home. Benefit check sent for Eliquis. Please send scripts to [facility], otherwise provide w 30 day Eliquis coupon at DC.   Expected Discharge Plan: [place] Barriers to Discharge: Continued Medical Work up   Patient Goals and CMS Choice Patient states their goals for this hospitalization and ongoing recovery are:: to go home CMS Medicare.gov Compare Post Acute Care list provided to:: Patient Choice offered to / list presented to : Patient  Expected Discharge Plan and Services Expected Discharge Plan: [place]   Discharge Planning Services: CM Consult,Medication Assistance Post Acute Care Choice: [place] arrangements for the past 2 months: Single Family Home                 DME Arranged: N/A         HH Arranged: PT HH Agency: [facility] [employer] Date [place]: 09/13/20 Time [place]: 1419 Representative spoke with at [place]: [place]  Prior Living Arrangements/Services Living arrangements for the past 2 months: [place] Lives with:: Spouse              Current home services: DME (shower seat, walker)    Activities of Daily Living Home Assistive Devices/Equipment: None ADL Screening (condition at time of admission) Patient's cognitive ability adequate to safely complete daily activities?: Yes Is the patient deaf or have difficulty  hearing?: No Does the patient have difficulty seeing, even when wearing glasses/contacts?: No Does the patient have difficulty concentrating, remembering, or making decisions?: No Patient able to express need for assistance with ADLs?: Yes Does the patient have difficulty dressing or bathing?: Yes Independently performs ADLs?: No Communication: Independent Dressing (OT): Needs assistance Is this a change from baseline?: Pre-admission baseline Grooming: Needs assistance Is this a change from baseline?: Pre-admission baseline Feeding: Independent Bathing: Needs assistance Is this a change from baseline?: Pre-admission baseline Toileting: Needs assistance Is this a change from baseline?: Pre-admission baseline In/Out Bed: Dependent,Needs assistance Is this a change from baseline?: Pre-admission baseline Walks in Home: Needs assistance Is this a change from baseline?: Pre-admission baseline Does the patient have difficulty walking or climbing stairs?: Yes Weakness of Legs: Both Weakness of Arms/Hands: Both  Permission Sought/Granted                  Emotional Assessment              Admission diagnosis:  Weakness [R53.1] NSTEMI (non-ST elevated myocardial infarction) ([place]) [I21.4] Non-traumatic rhabdomyolysis [M62.82] Chest pain, unspecified type [R07.9] Patient Active Problem List   Diagnosis Date Noted  . ILD (interstitial lung disease) ([place])   . NSTEMI (non-ST elevated myocardial infarction) ([place]) 09/09/2020  . Rhabdomyolysis 09/09/2020  . CKD (chronic kidney disease) stage 4, GFR 15-29 ml/min (HCC) 09/09/2020  . Unintended weight loss 09/09/2020  .  Weakness of both legs 09/09/2020  . Elevated LFTs 09/09/2020  . Prolonged QT interval 09/09/2020  . Weak urine stream 09/09/2020  . Weakness   . Unstable angina ([place]) 07/06/2020  . H/O insulin dependent diabetes mellitus 06/27/2020  . Loss of weight 06/27/2020  . Constipation 06/27/2020  . Vomiting 06/27/2020  .  History of colonic polyps 06/27/2020  . Coronary artery disease involving coronary bypass graft of native heart with angina pectoris ([place]) 02/19/2018  . Angina, class III ([place]) 02/19/2018  . Abnormal nuclear stress test 02/19/2018  . Diabetic polyneuropathy associated with type 2 diabetes mellitus ([place]) 02/18/2017  . Lumbosacral radiculopathy at L4 02/18/2017  . Vitamin D deficiency 02/18/2017  . Gastroesophageal reflux disease without esophagitis 05/14/2016  . Stasis ulcer of right ankle ([place]) 10/25/2014  . TIA (transient ischemic attack) 08/09/2014  . Acute ischemic stroke ([place]) 06/02/2014  . Family history of colon cancer in mother - 90 02/03/2014  . Benign neoplasm of colon- adenoma 01/12/2014  . Pain in joint, ankle and foot 11/30/2013  . Atherosclerotic PVD with ulceration ([place]) 11/30/2013  . Snoring 10/12/2013  . Hypersomnia 10/12/2013  . Anxiety   . Sleep apnea 08/17/2013  . Type 2 diabetes mellitus with vascular disease ([place]) 07/20/2013  . Obesity 04/20/2013  . CHF (congestive heart failure) ([place]) 03/10/2013  . Ischemic heart disease due to coronary artery obstruction ([place]) 03/10/2013  . Cervical spondylosis 03/01/2013  . DDD (degenerative disc disease), cervical 03/01/2013  . Single implantable cardioverter-defibrillator (ICD) in situ 12/11/2010  . Hyperlipidemia 09/27/2010  . HYPERTENSION, BENIGN 09/27/2010  . CAD, NATIVE VESSEL 09/27/2010  . VENTRICULAR FIBRILLATION 09/27/2010   PCP:  [name], MD Pharmacy:   [facility] [place], [place] [address] Phone: [phone] Fax: [place], [place] - [address] [place] [address] Phone: [phone] Fax: [phone]     Social Determinants of Health (SDOH) Interventions    Readmission Risk Interventions No flowsheet data found.

## 2020-09-13 NOTE — Progress Notes (Signed)
ANTICOAGULATION CONSULT NOTE - Follow Up Consult  Pharmacy Consult for IV Heparin Indication: chest pain/ACS and DVT  No Known Allergies  Patient Measurements: Height: 5\' 6"  (167.6 cm) Weight: 79.3 kg (174 lb 13.2 oz) IBW/kg (Calculated) : 63.8 Heparin Dosing Weight: 73.9 kg  Vital Signs: Temp: 97.9 F (36.6 C) (02/16 1500) Temp Source: Oral (02/16 1500) BP: 104/70 (02/16 1500) Pulse Rate: 92 (02/16 1500)  Labs: Recent Labs    09/11/20 0439 09/11/20 0921 09/11/20 1912 09/12/20 0200 09/12/20 0345 09/12/20 1642 09/13/20 0159 09/13/20 1950  HGB 11.7*  12.1*  --   --   --  11.4* 12.6* 12.3*  --   HCT 36.6*  --   --   --  35.2* 37.0* 36.5*  --   PLT 172  --   --   --  181  --  249  --   HEPARINUNFRC  --    < >  --   --  0.30  --  <0.10* 0.27*  CREATININE 2.28*  --   --   --  2.22*  --  2.04*  --   CKTOTAL  --    < > 4,981* 4,836*  --   --  3,319*  --   CKMB  --    < > 32.2* 32.4*  --   --  28.6*  --   TROPONINIHS  --    < > 1,970* 1,996*  --   --  2,291*  --    < > = values in this interval not displayed.    Estimated Creatinine Clearance: 35.7 mL/min (A) (by C-G formula based on SCr of 2.04 mg/dL (H)).  Assessment: 65 yr old male on heparin post cath for RLE DVT.  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  Plans are to start warfarin: -baseline INR = 1.0 -day 1 of overlap  Pt was on heparin 5000 units SQ Q 8 hrs (last dose at 0629 this AM).  Heparin infusion restarted this AM. Heparin level ~10 hrs after restarting heparin infusion at 1500 units/hr was 0.27 units/ml, which is just below the lower end of the goal range for this pt. H/H 12.3/36.5, plt 249. Per RN, no current issues with IV (IV was paused for 30 mins late this afternoon when IV access lost; current IV running fine); no bleeding observed.  Goal of Therapy:  Heparin level 0.3-0.7 units/ml Monitor platelets by anticoagulation protocol: Yes   Plan:  Increase heparin infusion to 1650  units/hr Check heparin level in 6 hrs Warfarin 7.5 mg po today - completed Monitor daily INR, heparin level, CBC Monitor for bleeding Will check into Lovenox cost in case needed at discharge  Gillermina Hu, PharmD, BCPS, [facility] Clinical Pharmacist 09/13/20 20:50 PM

## 2020-09-13 NOTE — Progress Notes (Signed)
[id number] Called pt to complete MI ed by telephone due to [place].  Will give MI materials to RN to give to pt. Discussed MI restrictions, NTG use, importance of brilinta, and CRP 2. Pt stated he did not do CRP 2 before when referred due to work schedule but is more open to it now. He stated he worked with PT earlier and walked. Stated he has two walkers at home. Encouraged to increase walking slowly and to be careful as he is on Coumadin and brilinta. Pt voiced understanding. Will give diabetic, heart healthy diets and MI booklet to staff.  Pt seen by CR in December and educated then also. No questions at this time pt stated. Will sign off. Graylon Good RN BSN 09/13/2020 1:42 PM

## 2020-09-13 NOTE — Progress Notes (Signed)
PROGRESS NOTE                                                                                                                                                                                                             Patient Demographics:    Eugene [name], is a 65 y.o. male, DOB - [birth year], HQP:[id number]  Outpatient Primary MD for the patient is [name], MD   Admit date - 09/08/2020   LOS - 4  Chief Complaint  Patient presents with  . Weakness       Brief Narrative: Patient is a 65 y.o. male with PMHx of history of CAD s/p CABG-subsequent recent PCI in 06/2020-V. fib arrest-s/p ICD implantation, CKD stage IV, HTN, DM, HLD who presented to the hospital with 1-2-week history of retrosternal chest pain responsive to nitroglycerin tablets-and 1 week history of bilateral lower extremity weakness.  Per patient-approximately 6 months ago-he noted that he was having trouble walking up a flight of stairs-and had actually put a ramp to get to his house from his garage.  He also acknowledges having unintentional weight loss-around 40-50 pounds in the past 1 year  COVID-19 vaccinated status: Vaccinated-J&J vaccine-but not boosted.  Significant Events: 2/11>> Admit to [facility] for non-STEMI-and bilateral lower extremity weakness.  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 No focal weakness, +ve leg weakness.   Assessment  & Plan :   Non-STEMI in a patient with history of CAD s/p CABG in the past: he rarely had NSTEMI was seen by cardiology underwent left heart cath on 09/12/2020 showing multivessel CAD recommendation is aggressive medical management with aspirin, Brilinta and if possible statin, will again trend troponin and CK in order to see if CK is coming from skeletal muscle damage or from heart.  Currently on heparin drip for DVT will  be transitioned to Coumadin as he takes Brilinta.  Bilateral lower extremity weakness with ++ CK & +++ESR: His CK is elevated but this could be due to his NSTEMI, I think there is a good chance he might have polymyalgia rheumatica however elevated CK levels are muddying the picture, worthwhile to give him a steroid trial, no headache or vision problems, no jaw pain.  Also low B12 levels for which he is on replacement.  Outpatient surgery follow-up for muscle biopsy if needed.  Will definitely require outpatient rheumatology follow-up.  Possible pulmonary fibrosis noted on CT scan.  Likely chronic, pulmonary following, multiple serologies ordered so far double-stranded DNA, aldolase, low B12 are positive, ANA is pending, outpatient pulmonary follow-up with [name] post discharge.  Incidental Covid infection with evidence of interstitial lung disease on imaging.  Asymptomatic from COVID-19 standpoint, pulmonary following defer work-up of pulmonary fibrosis to pulmonary.    Acute lower extremity DVT.  For now heparin drip >> Coumadin.  Transaminitis: Likely related to rhabdomyolysis-CK elevation.  No RUQ pain.  Complicated UTI: Growing Klebsiella total 5 days of IV antibiotics, stop date 09/14/2020.  Unintentional 40-50 pound weight loss in the past 1 year: Concern for undiagnosed malignancy-apparently was supposed to get colonoscopy-but due to  recent PCI/dual antiplatelet agents this was placed on hold. Noncontrast CT abdomen/pelvis without any obvious malignancy.  Patient PCP follow-up and age-appropriate work-up.  AKI versus CKD stage IV: Creatinine 2 years ago was under 1, currently creatinine gently coming down with hydration which will be continued due to elevated CK levels, outpatient nephrology follow-up post discharge.  Hypokalemia: Repleted.  B12 deficiency.  Placed on SQ replacement.    DM-2 (A1c 7.7 on 07/06/2020): On Lantus to 35 units BID, continue SSI. Follow and adjust.   Recent Labs    09/12/20 1156 09/12/20 2125 09/13/20 0833  GLUCAP 110* 108* 130*   COVID-19 infection: Incidental-asymptomatic-Remdesivir x3 days.  Lab Results  Component Value Date   SARSCOV2NAA POSITIVE (A) 09/09/2020   [place] NEGATIVE 07/05/2020      GI prophylaxis: PPI  Condition - Guarded  Family Communication  :  [name]-Significant other-[phone]-updated on 09/10/20, 09/13/20  Code Status :  Full Code  Diet :  Diet Order            Diet Carb Modified Fluid consistency: Thin; Room service appropriate? Yes  Diet effective now                  Disposition Plan  :   Status is: Inpatient  Remains inpatient appropriate because:Inpatient level of care appropriate due to severity of illness   Dispo: The patient is from: Home              Anticipated d/c is to: TBD              Anticipated d/c date is: > 3 days              Patient currently is not medically stable to d/c.   Difficult to place patient No  Barriers to discharge: Hypoxia requiring O2 supplementation/complete 5 days of IV Remdesivir  Antimicorbials  :    Anti-infectives (From admission, onward)   Start     Dose/Rate Route Frequency Ordered Stop   09/09/20 1300  cefTRIAXone (ROCEPHIN) 2 g in sodium chloride 0.9 % 100 mL IVPB        2 g 200 mL/hr over 30 Minutes Intravenous Every 24 hours 09/09/20 1109 09/14/20 1259  Inpatient  Medications  Scheduled Meds: . aspirin  81 mg Oral Daily  . atorvastatin  80 mg Oral Daily  . cyanocobalamin  1,000 mcg Intramuscular Daily  . feeding supplement  237 mL Oral TID BM  . insulin aspart  0-15 Units Subcutaneous TID WC  . insulin aspart  0-5 Units Subcutaneous QHS  . insulin glargine  35 Units Subcutaneous BID  . methylPREDNISolone (SOLU-MEDROL) injection  40 mg Intravenous Daily  . metoprolol succinate  25 mg Oral Daily  . multivitamin with minerals  1 tablet Oral Daily  . pantoprazole  40 mg Oral Q1200  . ticagrelor  90 mg Oral BID  . warfarin  7.5 mg Oral ONCE-1600  . Warfarin - Pharmacist Dosing Inpatient   Does not apply q1600   Continuous Infusions: . sodium chloride    . cefTRIAXone (ROCEPHIN)  IV 2 g (09/12/20 1205)  . heparin     PRN Meds:.sodium chloride, acetaminophen, nitroGLYCERIN, ondansetron (ZOFRAN) IV, sodium chloride flush   Time Spent in minutes  25   See all Orders from today for further details   Lala Lund M.D on 09/13/2020 at 9:40 AM  To page go to [email or web address] - use universal password  [facility] -  Office  [phone]    Objective:   Vitals:   09/12/20 2255 09/13/20 0048 09/13/20 0413 09/13/20 0836  BP: 112/84 101/61 109/69 92/66  Pulse: 99 (!) 106 (!) 107 90  Resp:    16  Temp:  97.7 F (36.5 C) (!) 97.5 F (36.4 C) 98.2 F (36.8 C)  TempSrc:  Oral Oral Oral  SpO2: 100% 100% 100% 100%  Weight:   79.3 kg   Height:        Wt Readings from Last 3 Encounters:  09/13/20 79.3 kg  08/04/20 80.2 kg  07/26/20 77.1 kg     Intake/Output Summary (Last 24 hours) at 09/13/2020 0940 Last data filed at 09/13/2020 0310 Gross per 24 hour  Intake 962.06 ml  Output 3200 ml  Net -2237.94 ml     Physical Exam  Awake Alert, No new F.N deficits, Normal affect [place].AT,PERRAL Supple Neck,No JVD, No cervical lymphadenopathy appriciated.  Symmetrical Chest wall movement, Good air movement bilaterally, CTAB RRR,No  Gallops, Rubs or new Murmurs, No Parasternal Heave +ve B.Sounds, Abd Soft, No tenderness, No organomegaly appriciated, No rebound - guarding or rigidity. No Cyanosis, Clubbing or edema, No new Rash or bruise     Data Review:    CBC Recent Labs  Lab 09/08/20 1823 09/09/20 0409 09/10/20 0142 09/11/20 0439 09/12/20 0345 09/12/20 1642 09/13/20 0159  WBC 13.2* 12.2* 12.1* 11.5* 11.4*  --  22.3*  HGB 13.0 12.3* 11.5* 11.7*  12.1* 11.4* 12.6* 12.3*  HCT 39.9 37.8* 33.4* 36.6* 35.2* 37.0* 36.5*  PLT 177 168 174 172 181  --  249  MCV 86.9 87.5 84.8 88.8 88.2  --  86.7  MCH 28.3 28.5 29.2 28.4 28.6  --  29.2  MCHC 32.6 32.5 34.4 32.0 32.4  --  33.7  RDW 14.3 14.4 14.6 14.8 14.7  --  15.4  LYMPHSABS 2.3  --   --   --   --   --   --   MONOABS 1.2*  --   --   --   --   --   --   EOSABS 0.1  --   --   --   --   --   --   BASOSABS 0.0  --   --   --   --   --   --  Chemistries  Recent Labs  Lab 09/08/20 1823 09/09/20 0409 09/09/20 1643 09/10/20 0721 09/11/20 0439 09/12/20 0345 09/12/20 1642 09/13/20 0159  NA 137 136  --  140 139 139 113* 139  K 3.5 2.8*  --  3.3* 4.3 3.6 3.3* 4.1  CL 102 103  --  106 110 109  --  111  CO2 19* 17*  --  23 20* 21*  --  14*  GLUCOSE 134* 89  --  60* 169* 141*  --  273*  BUN 55* 51*  --  42* 42* 38*  --  42*  CREATININE 2.80* 2.56*  --  2.15* 2.28* 2.22*  --  2.04*  CALCIUM 9.2 9.0  --  8.9 8.9 9.0  --  9.3  MG  --   --  1.8 2.6* 2.4 2.2  --  2.2  AST 208* 182*  --   --  146* 150*  --  130*  ALT 173* 154*  --   --  150* 155*  --  161*  ALKPHOS 58 46  --   --  54 57  --  60  BILITOT 1.6* 1.3*  --   --  0.9 0.8  --  1.2   ------------------------------------------------------------------------------------------------------------------ No results for input(s): CHOL, HDL, LDLCALC, TRIG, CHOLHDL, LDLDIRECT in the last 72 hours.  Lab Results  Component Value Date   HGBA1C 9.0 (H) 09/12/2020    ------------------------------------------------------------------------------------------------------------------ No results for input(s): TSH, T4TOTAL, T3FREE, THYROIDAB in the last 72 hours.  Invalid input(s): FREET3 ------------------------------------------------------------------------------------------------------------------ No results for input(s): VITAMINB12, FOLATE, FERRITIN, TIBC, IRON, RETICCTPCT in the last 72 hours.  Coagulation profile No results for input(s): INR, PROTIME in the last 168 hours.  Recent Labs    09/12/20 0345 09/13/20 0159  DDIMER 0.84* 1.03*    Cardiac Enzymes Recent Labs  Lab 09/11/20 0921 09/11/20 1912 09/12/20 0200  CKMB 33.2* 32.2* 32.4*   ------------------------------------------------------------------------------------------------------------------    Component Value Date/Time   BNP 569.4 (H) 09/13/2020 0159    Micro Results Recent Results (from the past 240 hour(s))  Resp Panel by RT-PCR (Flu A&B, Covid) Nasopharyngeal Swab     Status: Abnormal   Collection Time: 09/09/20 12:32 AM   Specimen: Nasopharyngeal Swab; Nasopharyngeal(NP) swabs in vial transport medium  Result Value Ref Range Status   SARS Coronavirus 2 by RT PCR POSITIVE (A) NEGATIVE Final    Comment: RESULT CALLED TO, READ BACK BY AND VERIFIED WITH: Elige Radon RN 09/09/20 0224 JDW (NOTE) SARS-CoV-2 target nucleic acids are DETECTED.  The SARS-CoV-2 RNA is generally detectable in upper respiratory specimens during the acute phase of infection. Positive results are indicative of the presence of the identified virus, but do not rule out bacterial infection or co-infection with other pathogens not detected by the test. Clinical correlation with patient history and other diagnostic information is necessary to determine patient infection status. The expected result is Negative.  Fact Sheet for Patients: EntrepreneurPulse.com.au  Fact Sheet for  Healthcare Providers: IncredibleEmployment.be  This test is not yet approved or cleared by the [place] FDA and  has been authorized for detection and/or diagnosis of SARS-CoV-2 by FDA under an Emergency Use Authorization (EUA).  This EUA will remain in effect (meaning this test can be [place] ed) for the duration of  the COVID-19 declaration under Section 564(b)(1) of the Act, 21 U.S.C. section 360bbb-3(b)(1), unless the authorization is terminated or revoked sooner.     Influenza A by PCR NEGATIVE NEGATIVE Final   Influenza B  by PCR NEGATIVE NEGATIVE Final    Comment: (NOTE) The Xpert Xpress SARS-CoV-2/FLU/RSV plus assay is intended as an aid in the diagnosis of influenza from Nasopharyngeal swab specimens and should not be used as a sole basis for treatment. Nasal washings and aspirates are unacceptable for Xpert Xpress SARS-CoV-2/FLU/RSV testing.  Fact Sheet for Patients: EntrepreneurPulse.com.au  Fact Sheet for Healthcare Providers: IncredibleEmployment.be  This test is not yet approved or cleared by the [place] FDA and has been authorized for detection and/or diagnosis of SARS-CoV-2 by FDA under an Emergency Use Authorization (EUA). This EUA will remain in effect (meaning this test can be used) for the duration of the COVID-19 declaration under Section 564(b)(1) of the Act, 21 U.S.C. section 360bbb-3(b)(1), unless the authorization is terminated or revoked.  Performed at [facility], [place] [address] [place], [place] [address]   Urine Culture     Status: Abnormal   Collection Time: 09/09/20 10:32 AM   Specimen: Urine, Random  Result Value Ref Range Status   Specimen Description URINE, RANDOM  Final   Special Requests   Final    Normal Performed at [facility], [place] [address], [place], [place] [address]    Culture >=100,000 COLONIES/mL KLEBSIELLA PNEUMONIAE (A)  Final   Report Status  09/11/2020 FINAL  Final   Organism ID, Bacteria KLEBSIELLA PNEUMONIAE (A)  Final      Susceptibility   Klebsiella pneumoniae - MIC*    AMPICILLIN >=32 RESISTANT Resistant     CEFAZOLIN <=4 SENSITIVE Sensitive     CEFEPIME <=0.12 SENSITIVE Sensitive     CEFTRIAXONE <=0.25 SENSITIVE Sensitive     CIPROFLOXACIN <=0.25 SENSITIVE Sensitive     GENTAMICIN <=1 SENSITIVE Sensitive     IMIPENEM <=0.25 SENSITIVE Sensitive     NITROFURANTOIN 64 INTERMEDIATE Intermediate     TRIMETH/SULFA <=20 SENSITIVE Sensitive     AMPICILLIN/SULBACTAM 4 SENSITIVE Sensitive     PIP/TAZO <=4 SENSITIVE Sensitive     * >=100,000 COLONIES/mL KLEBSIELLA PNEUMONIAE    Radiology Reports CT ABDOMEN PELVIS WO CONTRAST  Result Date: 09/09/2020 CLINICAL DATA:  Weight loss, and intended. EXAM: CT ABDOMEN AND PELVIS WITHOUT CONTRAST TECHNIQUE: Multidetector CT imaging of the abdomen and pelvis was performed following the standard protocol without IV contrast. COMPARISON:  None. FINDINGS: Lower chest: Coronary artery calcifications. Partially visualized cardiac defibrillator. Diffuse reticulations of the visualized lung bases as well as suggestion of emphysematous changes. Possible tiny hiatal hernia. Hepatobiliary: No focal liver abnormality. Several calcified gallstones within the gallbladder lumen. No gallbladder wall thickening or pericholecystic fluid. No biliary dilatation. Pancreas: No focal lesion. Normal pancreatic contour. No surrounding inflammatory changes. No main pancreatic ductal dilatation. Spleen: Normal in size without focal abnormality. Suggestion of a splenule (3:33). Adrenals/Urinary Tract: No adrenal nodule bilaterally. No nephrolithiasis, no hydronephrosis, and no contour-deforming renal mass. No ureterolithiasis or hydroureter. Urinary bladder wall emphysema. Stomach/Bowel: PO contrast reaches the mid small bowel. Stomach is within normal limits. No evidence of bowel wall thickening or dilatation. Appendix  appears normal. Vascular/Lymphatic: No abdominal aorta or iliac aneurysm. Mild atherosclerotic plaque of the aorta and its branches. No abdominal, pelvic, or inguinal lymphadenopathy. Reproductive: Prostate is unremarkable. Other: No intraperitoneal free fluid. No intraperitoneal free gas. No organized fluid collection. Musculoskeletal: No abdominal wall hernia or abnormality No suspicious lytic or blastic osseous lesions. No acute displaced fracture. Multilevel degenerative changes of the spine. IMPRESSION: 1. Emphysematous cystitis. 2. Please note limited evaluation of the abdomen and pelvis due to noncontrast  study. 3. Other imaging findings of potential clinical significance: Fibrosis and emphysematous changes of the lungs. Aortic Atherosclerosis (ICD10-I70.0). Electronically Signed   By: Iven Finn M.D.   On: 09/09/2020 03:09   CT HEAD WO CONTRAST  Result Date: 09/09/2020 CLINICAL DATA:  Neurological deficit.  Acute stroke suspected. EXAM: CT HEAD WITHOUT CONTRAST TECHNIQUE: Contiguous axial images were obtained from the base of the skull through the vertex without intravenous contrast. COMPARISON:  06/01/2014 FINDINGS: Brain: Mild to moderate generalized brain volume loss. Numerous areas of low-density in the hemispheric white matter and basal ganglia consistent with chronic small vessel disease. No sign of acute infarction, mass lesion, hemorrhage, hydrocephalus or extra-axial collection. Vascular: There is atherosclerotic calcification of the major vessels at the base of the brain. Skull: Negative Sinuses/Orbits: Clear/normal Other: None IMPRESSION: No acute finding by CT. Atrophy and chronic small-vessel ischemic changes of the white matter and basal ganglia. Electronically Signed   By: Nelson Chimes M.D.   On: 09/09/2020 00:55   CT CHEST WO CONTRAST  Result Date: 09/09/2020 CLINICAL DATA:  COVID positive. Generalized weakness. Retrosternal chest pain. EXAM: CT CHEST WITHOUT CONTRAST TECHNIQUE:  Multidetector CT imaging of the chest was performed following the standard protocol without IV contrast. COMPARISON:  Chest radiograph from one day prior. FINDINGS: Cardiovascular: Top-normal heart size. Single lead left subclavian ICD with lead tip in the right ventricular apex. No significant pericardial effusion/thickening. Three-vessel coronary atherosclerosis status post CABG. Atherosclerotic nonaneurysmal thoracic aorta. Normal caliber pulmonary arteries. Mediastinum/Nodes: No discrete thyroid nodules. Unremarkable esophagus. No pathologically enlarged axillary, mediastinal or hilar lymph nodes, noting limited sensitivity for the detection of hilar adenopathy on this noncontrast study. Lungs/Pleura: No pneumothorax. No pleural effusion. Mild centrilobular emphysema. Solid 5 mm right middle lobe pulmonary nodule along the minor fissure (series 5/image 75). No acute consolidative airspace disease, lung masses or additional significant pulmonary nodules. Extensive patchy confluent peripheral reticulation and ground-glass opacity throughout both lungs with associated moderate traction bronchiectasis, architectural distortion and volume loss. There is a basilar predominance to these findings. No frank honeycombing. Upper abdomen: Cholelithiasis. Musculoskeletal: No aggressive appearing focal osseous lesions. Intact sternotomy wires. Marked thoracic spondylosis. IMPRESSION: 1. 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 Suggest outpatient pulmonology consultation and follow-up high-resolution chest CT study in 6-12 months. Findings are categorized as probable UIP per consensus guidelines: Diagnosis of Idiopathic Pulmonary Fibrosis: An Official ATS/ERS/JRS/ALAT Clinical Practice Guideline. [place], Iss 5, [phone], Mar 29 2017. 2. Solid 5 mm right middle lobe pulmonary nodule along the minor fissure. Follow-up  noncontrast chest CT recommended in 12 months in this high risk patient. This recommendation follows the consensus statement: Guidelines for Management of Incidental Pulmonary Nodules Detected on CT Images: From the Fleischner Society 2017; Radiology 2017; 284:228-243. 3. Cholelithiasis. 4. Aortic Atherosclerosis (ICD10-I70.0) and Emphysema (ICD10-J43.9). Electronically Signed   By: Ilona Sorrel M.D.   On: 09/09/2020 12:08   CARDIAC CATHETERIZATION  Result Date: 09/12/2020  Prox LAD lesion is 99% stenosed.  Ost LAD to Prox LAD lesion is 100% stenosed.  Mid LAD lesion is 80% stenosed.  1st Mrg lesion is 99% stenosed.  Ost Cx to Prox Cx lesion is 80% stenosed.  Mid Cx lesion is 100% stenosed.  Balloon angioplasty was performed.  Prox RCA to Dist RCA lesion is 100% stenosed with 100% stenosed side branch in RPAV.  Ost Ramus lesion is 100% stenosed.  SVG and is normal in caliber.  Previously placed Prox Graft drug eluting stent is widely patent.  LIMA and is large.  The graft exhibits no disease.  SVG and is normal in caliber.  Origin to Prox Graft lesion is 100% stenosed.  SVG and is large.  Prox Graft to Dist Graft lesion is 100% stenosed.  Dist Cx lesion is 95% stenosed.  Mid LM to Dist LM lesion is 80% stenosed.  Post intervention, there is a 20% residual stenosis.  Severe native coronary obstructive disease 80% distal left main stenosis, total occlusion of the LAD, ramus immediate vessel, high-grade circumflex stenoses, total occlusion of the proximal RCA.  There was a shunt to the distal RCA via left to right and right to right collateralization. Patent LIMA graft supplying the mid LAD with diffuse disease in the LAD after insertion. Supplying the OM 2 vessel with widely patent stent in the proximal portion of the SVG.  There is 95% in-stent restenosis in the native OM 2 vessel beyond the anastomosis. Old occlusion of the vein graft which had supplied the ramus vessel. Old occlusion of the  vein graft which had supplied the PDA. Difficult but ultimately successful intervention through the SVG to the distal OM 2 vessel requiring PTCA, [name] Cutting Balloon, and ultimate noncompliant balloon dilatation with a 95% stenosis being reduced to less than 20%. LVEDP: 11 mmHg RECOMMENDATION: DAPT probably indefinitely.  Since the patient developed early in-stent restenosis on aspirin/Plavix, Brilinta was utilized for more aggressive antiplatelet therapy.  Medical therapy for concomitant CAD.  Optimal blood pressure control, diabetes management, and aggressive lipid therapy with target LDL less than 70.   DG Chest Portable 1 View  Result Date: 09/08/2020 CLINICAL DATA:  Chest pain. EXAM: PORTABLE CHEST 1 VIEW COMPARISON:  September 12, 2010 FINDINGS: A multi lead AICD is noted. Multiple sternal wires are seen. The heart size and mediastinal contours are within normal limits. A radiopaque loop recorder device is seen. There is no evidence of acute infiltrate, pleural effusion or pneumothorax. Degenerative changes are seen throughout the thoracic spine. IMPRESSION: Stable exam without acute or active cardiopulmonary disease. Electronically Signed   By: Virgina Norfolk M.D.   On: 09/08/2020 18:50   VAS [place] LOWER EXTREMITY VENOUS (DVT) (ONLY [facility] & [facility])  Result Date: 09/09/2020  Lower Venous DVT Study Indications: Covid-19.  Limitations: Shadowing from arterial plaque, clothing. Comparison Study: No prior study Performing Technologist: Sharion Dove RVS  Examination Guidelines: A complete evaluation includes B-mode imaging, spectral Doppler, color Doppler, and power Doppler as needed of all accessible portions of each vessel. Bilateral testing is considered an integral part of a complete examination. Limited examinations for reoccurring indications may be performed as noted. The reflux portion of the exam is performed with the patient in reverse Trendelenburg.   +---------+---------------+---------+-----------+----------+-------------------+ RIGHT    CompressibilityPhasicitySpontaneityPropertiesThrombus Aging      +---------+---------------+---------+-----------+----------+-------------------+ CFV                                                   Not well visualized +---------+---------------+---------+-----------+----------+-------------------+ SFJ                                                   Not well visualized +---------+---------------+---------+-----------+----------+-------------------+ FV Prox  Full           Yes      Yes                                      +---------+---------------+---------+-----------+----------+-------------------+ FV Mid   Full                                                             +---------+---------------+---------+-----------+----------+-------------------+ FV DistalFull                                                             +---------+---------------+---------+-----------+----------+-------------------+ PFV                                                   Not well visualized +---------+---------------+---------+-----------+----------+-------------------+ POP      Partial        Yes      Yes                  Acute               +---------+---------------+---------+-----------+----------+-------------------+ PTV      Full                                                             +---------+---------------+---------+-----------+----------+-------------------+ PERO     None                                         Acute               +---------+---------------+---------+-----------+----------+-------------------+ Gastroc  None                                         Acute               +---------+---------------+---------+-----------+----------+-------------------+   +---------+---------------+---------+-----------+----------+-------------------+  LEFT     CompressibilityPhasicitySpontaneityPropertiesThrombus Aging      +---------+---------------+---------+-----------+----------+-------------------+ CFV                                                   Not well visualized +---------+---------------+---------+-----------+----------+-------------------+ SFJ  Not well visualized +---------+---------------+---------+-----------+----------+-------------------+ FV Prox  Full           Yes      Yes                                      +---------+---------------+---------+-----------+----------+-------------------+ FV Mid   Full                                                             +---------+---------------+---------+-----------+----------+-------------------+ FV DistalFull           Yes      Yes                                      +---------+---------------+---------+-----------+----------+-------------------+ PFV                                                   Not well visualized +---------+---------------+---------+-----------+----------+-------------------+ POP      Full           Yes      Yes                                      +---------+---------------+---------+-----------+----------+-------------------+ PTV      Full                                                             +---------+---------------+---------+-----------+----------+-------------------+ PERO     Full                                                             +---------+---------------+---------+-----------+----------+-------------------+    Summary: RIGHT: - Findings consistent with acute deep vein thrombosis involving the right popliteal vein, right peroneal veins, and right gastrocnemius veins. - There is no evidence of deep vein thrombosis in the lower extremity. - There is no evidence of superficial venous thrombosis.  LEFT: - No evidence of common femoral  vein obstruction.  *See table(s) above for measurements and observations. Electronically signed by Harold Barban MD on 09/09/2020 at 7:57:19 PM.    Final    ECHOCARDIOGRAM LIMITED  Result Date: 09/10/2020    ECHOCARDIOGRAM LIMITED REPORT   Patient Name:   EMETERIO BALKE [facility] Date of Exam: 09/09/2020 Medical Rec #:  [id number]    Height:       66.0 in Accession #:    [id number]   Weight:       163.0 lb Date of Birth:  [birth year]   BSA:          1.833 m  Patient Age:    75 years     BP:           99/66 mmHg Patient Gender: M            HR:           81 bpm. Exam Location:  Inpatient Procedure: Limited Echo, Limited Color Doppler and Cardiac Doppler Indications:    NSTEMI  History:        Patient has prior history of Echocardiogram examinations, most                 recent 08/16/2020. Defibrillator and Prior CABG, Covid. chronic                 kidney disease; Risk Factors:Diabetes.  Sonographer:    Johny Chess Referring Phys: [id number] [place]  1. Left ventricular ejection fraction, by estimation, is 50 to 55%. The left ventricle has low normal function. The left ventricle has no regional wall motion abnormalities. Left ventricular diastolic parameters are consistent with Grade I diastolic dysfunction (impaired relaxation).  2. Right ventricular systolic function is normal.  3. The mitral valve is normal in structure. No evidence of mitral valve regurgitation.  4. The aortic valve is tricuspid. There is mild calcification of the aortic valve. There is mild thickening of the aortic valve. Aortic valve regurgitation is not visualized. No aortic stenosis is present. Comparison(s): A prior study was performed on 08/16/2020. No significant change from prior study. Prior images reviewed side by side. FINDINGS  Left Ventricle: Left ventricular ejection fraction, by estimation, is 50 to 55%. The left ventricle has low normal function. The left ventricle has no regional wall motion abnormalities. Abnormal  (paradoxical) septal motion, consistent with RV pacemaker. Left ventricular diastolic parameters are consistent with Grade I diastolic dysfunction (impaired relaxation). Right Ventricle: Right ventricular systolic function is normal. Pericardium: Trivial pericardial effusion is present. Mitral Valve: The mitral valve is normal in structure. Tricuspid Valve: The tricuspid valve is grossly normal. Tricuspid valve regurgitation is not demonstrated. Aortic Valve: The aortic valve is tricuspid. There is mild calcification of the aortic valve. There is mild thickening of the aortic valve. Aortic valve regurgitation is not visualized. No aortic stenosis is present. Aorta: The aortic root and ascending aorta are structurally normal, with no evidence of dilitation. Additional Comments: A pacer wire is visualized. LEFT VENTRICLE PLAX 2D LVIDd:         4.60 cm     Diastology LVIDs:         3.70 cm     LV e' medial:    5.55 cm/s LV PW:         1.00 cm     LV E/e' medial:  11.9 LV IVS:        0.80 cm     LV e' lateral:   6.53 cm/s                            LV E/e' lateral: 10.1  LV Volumes (MOD) LV vol d, MOD A2C: 67.7 ml LV vol s, MOD A2C: 40.4 ml LV SV MOD A2C:     27.3 ml IVC IVC diam: 1.50 cm LEFT ATRIUM         Index LA diam:    3.80 cm 2.07 cm/m  AORTIC VALVE LVOT Vmax:   70.30 cm/s LVOT Vmean:  45.000 cm/s LVOT VTI:    0.126 m  AORTA  Ao Asc diam: 2.90 cm MITRAL VALVE MV Area (PHT): 4.63 cm     SHUNTS MV Decel Time: 164 msec     Systemic VTI: 0.13 m MV E velocity: 66.00 cm/s MV A velocity: 105.00 cm/s MV E/A ratio:  0.63 Rudean Haskell MD Electronically signed by Rudean Haskell MD Signature Date/Time: 09/10/2020/12:10:15 PM    Final    ECHOCARDIOGRAM LIMITED  Result Date: 08/16/2020    ECHOCARDIOGRAM LIMITED REPORT   Patient Name:   PSALM SCHAPPELL [facility]  Date of Exam: 08/16/2020 Medical Rec #:  [id number]     Height:       66.0 in Accession #:    [id number]    Weight:       176.8 lb Date of Birth:  06-Dec-1955     BSA:          1.898 m Patient Age:    41 years      BP:           111/67 mmHg Patient Gender: M             HR:           71 bpm. Exam Location:  [facility] Procedure: Limited Echo, Limited Color Doppler, Cardiac Doppler and Intracardiac            Opacification Agent Indications:    I50.9 CHF  History:        Patient has prior history of Echocardiogram examinations, most                 recent 07/06/2020. CHF, Previous Myocardial Infarction and CAD,                 Prior CABG, Stroke; Risk Factors:Hypertension, HLD, Diabetes and                 Dyslipidemia.  Sonographer:    Marygrace Drought RCS Referring Phys: [id number] [place] A CHANDRASEKHAR IMPRESSIONS  1. Left ventricular ejection fraction, by estimation, is 50 to 55%. The left ventricle has low normal function. The left ventricle has no regional wall motion abnormalities.  2. Right ventricular systolic function is normal. The right ventricular size is normal. There is normal pulmonary artery systolic pressure.  3. Left atrial size was mildly dilated.  4. The pericardial effusion is posterior and lateral to the left ventricle.  5. The mitral valve is normal in structure. No evidence of mitral valve regurgitation. No evidence of mitral stenosis.  6. The aortic valve is normal in structure. Aortic valve regurgitation is not visualized. No aortic stenosis is present.  7. The inferior vena cava is normal in size with greater than 50% respiratory variability, suggesting right atrial pressure of 3 mmHg. FINDINGS  Left Ventricle: Left ventricular ejection fraction, by estimation, is 50 to 55%. The left ventricle has low normal function. The left ventricle has no regional wall motion abnormalities. The left ventricular internal cavity size was normal in size. There is no left ventricular hypertrophy. Right Ventricle: The right ventricular size is normal. No increase in right ventricular wall thickness. Right ventricular systolic function is normal. There is normal  pulmonary artery systolic pressure. The tricuspid regurgitant velocity is 2.13 m/s, and  with an assumed right atrial pressure of 3 mmHg, the estimated right ventricular systolic pressure is 86.7 mmHg. Left Atrium: Left atrial size was mildly dilated. Right Atrium: Right atrial size was normal in size. Pericardium: Trivial pericardial effusion is present. The pericardial effusion is posterior and lateral to the left  ventricle. Mitral Valve: The mitral valve is normal in structure. There is mild thickening of the mitral valve leaflet(s). There is mild calcification of the mitral valve leaflet(s). Mild mitral annular calcification. No evidence of mitral valve stenosis. Tricuspid Valve: The tricuspid valve is normal in structure. Tricuspid valve regurgitation is not demonstrated. No evidence of tricuspid stenosis. Aortic Valve: The aortic valve is normal in structure. Aortic valve regurgitation is not visualized. No aortic stenosis is present. Pulmonic Valve: The pulmonic valve was normal in structure. Pulmonic valve regurgitation is not visualized. No evidence of pulmonic stenosis. Aorta: The aortic root is normal in size and structure. Venous: The inferior vena cava is normal in size with greater than 50% respiratory variability, suggesting right atrial pressure of 3 mmHg. IAS/Shunts: No atrial level shunt detected by color flow Doppler. Additional Comments: A pacer wire is visualized. LEFT VENTRICLE PLAX 2D LVIDd:         4.10 cm LVIDs:         2.90 cm LV PW:         1.40 cm LV IVS:        0.80 cm LVOT diam:     2.20 cm LVOT Area:     3.80 cm  RIGHT VENTRICLE RVSP:           21.1 mmHg LEFT ATRIUM         Index      RIGHT ATRIUM LA diam:    3.90 cm 2.06 cm/m RA Pressure: 3.00 mmHg   AORTA Ao Root diam: 3.40 cm Ao Asc diam:  2.80 cm MITRAL VALVE                TRICUSPID VALVE MV Area (PHT):              TR Peak grad:   18.1 mmHg MV Decel Time:              TR Vmax:        213.00 cm/s MV E velocity: 85.70 cm/s    Estimated RAP:  3.00 mmHg MV A velocity: 109.00 cm/s  RVSP:           21.1 mmHg MV E/A ratio:  0.79                             SHUNTS                             Systemic Diam: 2.20 cm Jenkins Rouge MD Electronically signed by Jenkins Rouge MD Signature Date/Time: 08/16/2020/9:55:39 AM    Final

## 2020-09-13 NOTE — Discharge Instructions (Addendum)
Follow with Primary MD [name], MD in 7 days   Get CBC, CMP, INR, 2 view Chest X ray -  checked on coming Monday by Primary MD, get Lovenox and Coumadin dose adjusted as needed.   Activity: As tolerated with Full fall precautions use walker/cane & assistance as needed  Disposition Home   Diet: Heart Healthy Low Carb.  Accuchecks 4 times/day, Once in AM empty stomach and then before each meal. Log in all results and show them to your Prim.MD in 3 days. If any glucose reading is under 80 or above 300 call your Prim MD immidiately. Follow Low glucose instructions for glucose under 80 as instructed.   Special Instructions: If you have smoked or chewed Tobacco  in the last 2 yrs please stop smoking, stop any regular Alcohol  and or any Recreational drug use.  On your next visit with your primary care physician please Get Medicines reviewed and adjusted.  Please request your Prim.MD to go over all Hospital Tests and Procedure/Radiological results at the follow up, please get all Hospital records sent to your Prim MD by signing hospital release before you go home.  If you experience worsening of your admission symptoms, develop shortness of breath, life threatening emergency, suicidal or homicidal thoughts you must seek medical attention immediately by calling 911 or calling your MD immediately  if symptoms less severe.  You Must read complete instructions/literature along with all the possible adverse reactions/side effects for all the Medicines you take and that have been prescribed to you. Take any new Medicines after you have completely understood and accpet all the possible adverse reactions/side effects.         Person Under Monitoring Name: Eugene [name]  Location: [place] [address]   Infection Prevention Recommendations for Individuals Confirmed to have, or Being Evaluated for, 2019 Novel Coronavirus (COVID-19) Infection Who Receive Care at  Home  Individuals who are confirmed to have, or are being evaluated for, COVID-19 should follow the prevention steps below until a healthcare provider or local or state health department says they can return to normal activities.  Stay home except to get medical care You should restrict activities outside your home, except for getting medical care. Do not go to work, school, or public areas, and do not use public transportation or taxis.  Call ahead before visiting your doctor Before your medical appointment, call the healthcare provider and tell them that you have, or are being evaluated for, COVID-19 infection. This will help the healthcare providers office take steps to keep other people from getting infected. Ask your healthcare provider to call the local or state health department.  Monitor your symptoms Seek prompt medical attention if your illness is worsening (e.g., difficulty breathing). Before going to your medical appointment, call the healthcare provider and tell them that you have, or are being evaluated for, COVID-19 infection. Ask your healthcare provider to call the local or state health department.  Wear a facemask You should wear a facemask that covers your nose and mouth when you are in the same room with other people and when you visit a healthcare provider. People who live with or visit you should also wear a facemask while they are in the same room with you.  Separate yourself from other people in your home As much as possible, you should stay in a different room from other people in your home. Also, you should use a separate bathroom, if available.  Avoid sharing  household items You should not share dishes, drinking glasses, cups, eating utensils, towels, bedding, or other items with other people in your home. After using these items, you should wash them thoroughly with soap and water.  Cover your coughs and sneezes Cover your mouth and nose with a tissue  when you cough or sneeze, or you can cough or sneeze into your sleeve. Throw used tissues in a lined trash can, and immediately wash your hands with soap and water for at least 20 seconds or use an alcohol-based hand rub.  Wash your [facility] your hands often and thoroughly with soap and water for at least 20 seconds. You can use an alcohol-based hand sanitizer if soap and water are not available and if your hands are not visibly dirty. Avoid touching your eyes, nose, and mouth with unwashed hands.   Prevention Steps for Caregivers and Household Members of Individuals Confirmed to have, or Being Evaluated for, COVID-19 Infection Being Cared for in the Home  If you live with, or provide care at home for, a person confirmed to have, or being evaluated for, COVID-19 infection please follow these guidelines to prevent infection:  Follow healthcare providers instructions Make sure that you understand and can help the patient follow any healthcare provider instructions for all care.  Provide for the patients basic needs You should help the patient with basic needs in the home and provide support for getting groceries, prescriptions, and other personal needs.  Monitor the patients symptoms If they are getting sicker, call his or her medical provider and tell them that the patient has, or is being evaluated for, COVID-19 infection. This will help the healthcare providers office take steps to keep other people from getting infected. Ask the healthcare provider to call the local or state health department.  Limit the number of people who have contact with the patient  If possible, have only one caregiver for the patient.  Other household members should stay in another home or place of residence. If this is not possible, they should stay  in another room, or be separated from the patient as much as possible. Use a separate bathroom, if available.  Restrict visitors who do not have an  essential need to be in the home.  Keep older adults, very young children, and other sick people away from the patient Keep older adults, very young children, and those who have compromised immune systems or chronic health conditions away from the patient. This includes people with chronic heart, lung, or kidney conditions, diabetes, and cancer.  Ensure good ventilation Make sure that shared spaces in the home have good air flow, such as from an air conditioner or an opened window, weather permitting.  Wash your hands often  Wash your hands often and thoroughly with soap and water for at least 20 seconds. You can use an alcohol based hand sanitizer if soap and water are not available and if your hands are not visibly dirty.  Avoid touching your eyes, nose, and mouth with unwashed hands.  Use disposable paper towels to dry your hands. If not available, use dedicated cloth towels and replace them when they become wet.  Wear a facemask and gloves  Wear a disposable facemask at all times in the room and gloves when you touch or have contact with the patients blood, body fluids, and/or secretions or excretions, such as sweat, saliva, sputum, nasal mucus, vomit, urine, or feces.  Ensure the mask fits over your nose and mouth tightly,  and do not touch it during use.  Throw out disposable facemasks and gloves after using them. Do not reuse.  Wash your hands immediately after removing your facemask and gloves.  If your personal clothing becomes contaminated, carefully remove clothing and launder. Wash your hands after handling contaminated clothing.  Place all used disposable facemasks, gloves, and other waste in a lined container before disposing them with other household waste.  Remove gloves and wash your hands immediately after handling these items.  Do not share dishes, glasses, or other household items with the patient  Avoid sharing household items. You should not share dishes,  drinking glasses, cups, eating utensils, towels, bedding, or other items with a patient who is confirmed to have, or being evaluated for, COVID-19 infection.  After the person uses these items, you should wash them thoroughly with soap and water.  Wash laundry thoroughly  Immediately remove and wash clothes or bedding that have blood, body fluids, and/or secretions or excretions, such as sweat, saliva, sputum, nasal mucus, vomit, urine, or feces, on them.  Wear gloves when handling laundry from the patient.  Read and follow directions on labels of laundry or clothing items and detergent. In general, wash and dry with the warmest temperatures recommended on the label.  Clean all areas the individual has used often  Clean all touchable surfaces, such as counters, tabletops, doorknobs, bathroom fixtures, toilets, phones, keyboards, tablets, and bedside tables, every day. Also, clean any surfaces that may have blood, body fluids, and/or secretions or excretions on them.  Wear gloves when cleaning surfaces the patient has come in contact with.  Use a diluted bleach solution (e.g., dilute bleach with 1 part bleach and 10 parts water) or a household disinfectant with a label that says EPA-registered for coronaviruses. To make a bleach solution at home, add 1 tablespoon of bleach to 1 quart (4 cups) of water. For a larger supply, add  cup of bleach to 1 gallon (16 cups) of water.  Read labels of cleaning products and follow recommendations provided on product labels. Labels contain instructions for safe and effective use of the cleaning product including precautions you should take when applying the product, such as wearing gloves or eye protection and making sure you have good ventilation during use of the product.  Remove gloves and wash hands immediately after cleaning.  Monitor yourself for signs and symptoms of illness Caregivers and household members are considered close contacts, should  monitor their health, and will be asked to limit movement outside of the home to the extent possible. Follow the monitoring steps for close contacts listed on the symptom monitoring form.   ? If you have additional questions, contact your local health department or call the epidemiologist on call at [phone] (available 24/7). ? This guidance is subject to change. For the most up-to-date guidance from [facility], please refer to their website: YouBlogs.pl   Information on my medicine - Coumadin   (Warfarin)  Why was Coumadin prescribed for you? Coumadin was prescribed for you because you have a blood clot or a medical condition that can cause an increased risk of forming blood clots. Blood clots can cause serious health problems by blocking the flow of blood to the heart, lung, or brain. Coumadin can prevent harmful blood clots from forming. As a reminder your indication for Coumadin is:   Deep Vein Thrombosis Treatment  What test will check on my response to Coumadin? While on Coumadin (warfarin) you will need to have an INR  test regularly to ensure that your dose is keeping you in the desired range. The INR (international normalized ratio) number is calculated from the result of the laboratory test called prothrombin time (PT).  If an INR APPOINTMENT HAS NOT ALREADY BEEN MADE FOR YOU please schedule an appointment to have this lab work done by your health care provider within 7 days. Your INR goal is usually a number between:  2 to 3 or your provider may give you a more narrow range like 2-2.5.  Ask your health care provider during an office visit what your goal INR is.  What  do you need to  know  About  COUMADIN? Take Coumadin (warfarin) exactly as prescribed by your healthcare provider about the same time each day.  DO NOT stop taking without talking to the doctor who prescribed the medication.  Stopping without other blood clot  prevention medication to take the place of Coumadin may increase your risk of developing a new clot or stroke.  Get refills before you run out.  What do you do if you miss a dose? If you miss a dose, take it as soon as you remember on the same day then continue your regularly scheduled regimen the next day.  Do not take two doses of Coumadin at the same time.  Important Safety Information A possible side effect of Coumadin (Warfarin) is an increased risk of bleeding. You should call your healthcare provider right away if you experience any of the following: ? Bleeding from an injury or your nose that does not stop. ? Unusual colored urine (red or dark brown) or unusual colored stools (red or black). ? Unusual bruising for unknown reasons. ? A serious fall or if you hit your head (even if there is no bleeding).  Some foods or medicines interact with Coumadin (warfarin) and might alter your response to warfarin. To help avoid this: ? Eat a balanced diet, maintaining a consistent amount of Vitamin K. ? Notify your provider about major diet changes you plan to make. ? Avoid alcohol or limit your intake to 1 drink for women and 2 drinks for men per day. (1 drink is 5 oz. wine, 12 oz. beer, or 1.5 oz. liquor.)  Make sure that ANY health care provider who prescribes medication for you knows that you are taking Coumadin (warfarin).  Also make sure the healthcare provider who is monitoring your Coumadin knows when you have started a new medication including herbals and non-prescription products.  Coumadin (Warfarin)  Major Drug Interactions  Increased Warfarin Effect Decreased Warfarin Effect  Alcohol (large quantities) Antibiotics (esp. Septra/Bactrim, Flagyl, Cipro) Amiodarone (Cordarone) Aspirin (ASA) Cimetidine (Tagamet) Megestrol (Megace) NSAIDs (ibuprofen, naproxen, etc.) Piroxicam (Feldene) Propafenone (Rythmol SR) Propranolol (Inderal) Isoniazid (INH) Posaconazole (Noxafil)  Barbiturates (Phenobarbital) Carbamazepine (Tegretol) Chlordiazepoxide (Librium) Cholestyramine (Questran) Griseofulvin Oral Contraceptives Rifampin Sucralfate (Carafate) Vitamin K   Coumadin (Warfarin) Major Herbal Interactions  Increased Warfarin Effect Decreased Warfarin Effect  Garlic Ginseng Ginkgo biloba Coenzyme Q10 Green tea St. Johns wort    Coumadin (Warfarin) FOOD Interactions  Eat a consistent number of servings per week of foods HIGH in Vitamin K (1 serving =  cup)  Collards (cooked, or boiled & drained) Kale (cooked, or boiled & drained) Mustard greens (cooked, or boiled & drained) Parsley *serving size only =  cup Spinach (cooked, or boiled & drained) Swiss chard (cooked, or boiled & drained) Turnip greens (cooked, or boiled & drained)  Eat a consistent number of servings per week of  foods MEDIUM-HIGH in Vitamin K (1 serving = 1 cup)  Asparagus (cooked, or boiled & drained) Broccoli (cooked, boiled & drained, or raw & chopped) Brussel sprouts (cooked, or boiled & drained) *serving size only =  cup Lettuce, raw (green leaf, endive, romaine) Spinach, raw Turnip greens, raw & chopped   These websites have more information on Coumadin (warfarin):  FailFactory.se; VeganReport.com.au;    Diabetes Mellitus and Nutrition, Adult When you have diabetes, or diabetes mellitus, it is very important to have healthy eating habits because your blood sugar (glucose) levels are greatly affected by what you eat and drink. Eating healthy foods in the right amounts, at about the same times every day, can help you:  Control your blood glucose.  Lower your risk of heart disease.  Improve your blood pressure.  Reach or maintain a healthy weight. What can affect my meal plan? Every person with diabetes is different, and each person has different needs for a meal plan. Your health care provider may recommend that you work with a dietitian to make a  meal plan that is best for you. Your meal plan may vary depending on factors such as:  The calories you need.  The medicines you take.  Your weight.  Your blood glucose, blood pressure, and cholesterol levels.  Your activity level.  Other health conditions you have, such as heart or kidney disease. How do carbohydrates affect me? Carbohydrates, also called carbs, affect your blood glucose level more than any other type of food. Eating carbs naturally raises the amount of glucose in your blood. Carb counting is a method for keeping track of how many carbs you eat. Counting carbs is important to keep your blood glucose at a healthy level, especially if you use insulin or take certain oral diabetes medicines. It is important to know how many carbs you can safely have in each meal. This is different for every person. Your dietitian can help you calculate how many carbs you should have at each meal and for each snack. How does alcohol affect me? Alcohol can cause a sudden decrease in blood glucose (hypoglycemia), especially if you use insulin or take certain oral diabetes medicines. Hypoglycemia can be a life-threatening condition. Symptoms of hypoglycemia, such as sleepiness, dizziness, and confusion, are similar to symptoms of having too much alcohol.  Do not drink alcohol if: ? Your health care provider tells you not to drink. ? You are pregnant, may be pregnant, or are planning to become pregnant.  If you drink alcohol: ? Do not drink on an empty stomach. ? Limit how much you use to:  0-1 drink a day for women.  0-2 drinks a day for men. ? Be aware of how much alcohol is in your drink. In the [place] one drink equals one 12 oz bottle of beer (355 mL), one 5 oz glass of wine (148 mL), or one 1 oz glass of hard liquor (44 mL). ? Keep yourself hydrated with water, diet soda, or unsweetened iced tea.  Keep in mind that regular soda, juice, and other mixers may contain a lot of sugar and  must be counted as carbs. What are tips for following this plan? Reading food labels  Start by checking the serving size on the "Nutrition Facts" label of packaged foods and drinks. The amount of calories, carbs, fats, and other nutrients listed on the label is based on one serving of the item. Many items contain more than one serving per package.  Check the  total grams (g) of carbs in one serving. You can calculate the number of servings of carbs in one serving by dividing the total carbs by 15. For example, if a food has 30 g of total carbs per serving, it would be equal to 2 servings of carbs.  Check the number of grams (g) of saturated fats and trans fats in one serving. Choose foods that have a low amount or none of these fats.  Check the number of milligrams (mg) of salt (sodium) in one serving. Most people should limit total sodium intake to less than 2,300 mg per day.  Always check the nutrition information of foods labeled as "low-fat" or "nonfat." These foods may be higher in added sugar or refined carbs and should be avoided.  Talk to your dietitian to identify your daily goals for nutrients listed on the label. Shopping  Avoid buying canned, pre-made, or processed foods. These foods tend to be high in fat, sodium, and added sugar.  Shop around the outside edge of the grocery store. This is where you will most often find fresh fruits and vegetables, bulk grains, fresh meats, and fresh dairy. Cooking  Use low-heat cooking methods, such as baking, instead of high-heat cooking methods like deep frying.  Cook using healthy oils, such as olive, canola, or sunflower oil.  Avoid cooking with butter, cream, or high-fat meats. Meal planning  Eat meals and snacks regularly, preferably at the same times every day. Avoid going long periods of time without eating.  Eat foods that are high in fiber, such as fresh fruits, vegetables, beans, and whole grains. Talk with your dietitian about  how many servings of carbs you can eat at each meal.  Eat 4-6 oz (112-168 g) of lean protein each day, such as lean meat, chicken, fish, eggs, or tofu. One ounce (oz) of lean protein is equal to: ? 1 oz (28 g) of meat, chicken, or fish. ? 1 egg. ?  cup (62 g) of tofu.  Eat some foods each day that contain healthy fats, such as avocado, nuts, seeds, and fish.   What foods should I eat? Fruits Berries. Apples. Oranges. Peaches. Apricots. Plums. Grapes. Mango. Papaya. Pomegranate. Kiwi. Cherries. Vegetables Lettuce. Spinach. Leafy greens, including kale, chard, collard greens, and mustard greens. Beets. Cauliflower. Cabbage. Broccoli. Carrots. Green beans. Tomatoes. Peppers. Onions. Cucumbers. Brussels sprouts. Grains Whole grains, such as whole-wheat or whole-grain bread, crackers, tortillas, cereal, and pasta. Unsweetened oatmeal. Quinoa. Brown or wild rice. Meats and other proteins Seafood. Poultry without skin. Lean cuts of poultry and beef. Tofu. Nuts. Seeds. Dairy Low-fat or fat-free dairy products such as milk, yogurt, and cheese. The items listed above may not be a complete list of foods and beverages you can eat. Contact a dietitian for more information. What foods should I avoid? Fruits Fruits canned with syrup. Vegetables Canned vegetables. Frozen vegetables with butter or cream sauce. Grains Refined white flour and flour products such as bread, pasta, snack foods, and cereals. Avoid all processed foods. Meats and other proteins Fatty cuts of meat. Poultry with skin. Breaded or fried meats. Processed meat. Avoid saturated fats. Dairy Full-fat yogurt, cheese, or milk. Beverages Sweetened drinks, such as soda or iced tea. The items listed above may not be a complete list of foods and beverages you should avoid. Contact a dietitian for more information. Questions to ask a health care provider  Do I need to meet with a diabetes educator?  Do I need to meet with a  dietitian?  What number can I call if I have questions?  When are the best times to check my blood glucose? Where to find more information:  American Diabetes Association: diabetes.org  Academy of Nutrition and Dietetics: www.eatright.[employer] of Diabetes and Digestive and Kidney Diseases: [employer].dk  Association of Diabetes Care and Education Specialists: www.diabeteseducator.org Summary  It is important to have healthy eating habits because your blood sugar (glucose) levels are greatly affected by what you eat and drink.  A healthy meal plan will help you control your blood glucose and maintain a healthy lifestyle.  Your health care provider may recommend that you work with a dietitian to make a meal plan that is best for you.  Keep in mind that carbohydrates (carbs) and alcohol have immediate effects on your blood glucose levels. It is important to count carbs and to use alcohol carefully. This information is not intended to replace advice given to you by your health care provider. Make sure you discuss any questions you have with your health care provider. Document Revised: 06/22/2019 Document Reviewed: 06/22/2019 Elsevier Patient Education  2021 [place].  Heart-Healthy Eating Plan Heart-healthy meal planning includes:  Eating less unhealthy fats.  Eating more healthy fats.  Making other changes in your diet. Talk with your doctor or a diet specialist (dietitian) to create an eating plan that is right for you. What is my plan? Your doctor may recommend an eating plan that includes:  Total fat: ______% or less of total calories a day.  Saturated fat: ______% or less of total calories a day.  Cholesterol: less than _________mg a day. What are tips for following this plan? Cooking Avoid frying your food. Try to bake, boil, grill, or broil it instead. You can also reduce fat by:  Removing the skin from poultry.  Removing all visible fats  from meats.  Steaming vegetables in water or broth. Meal planning  At meals, divide your plate into four equal parts: ? Fill one-half of your plate with vegetables and green salads. ? Fill one-fourth of your plate with whole grains. ? Fill one-fourth of your plate with lean protein foods.  Eat 4-5 servings of vegetables per day. A serving of vegetables is: ? 1 cup of raw or cooked vegetables. ? 2 cups of raw leafy greens.  Eat 4-5 servings of fruit per day. A serving of fruit is: ? 1 medium whole fruit. ?  cup of dried fruit. ?  cup of fresh, frozen, or canned fruit. ?  cup of 100% fruit juice.  Eat more foods that have soluble fiber. These are apples, broccoli, carrots, beans, peas, and barley. Try to get 20-30 g of fiber per day.  Eat 4-5 servings of nuts, legumes, and seeds per week: ? 1 serving of dried beans or legumes equals  cup after being cooked. ? 1 serving of nuts is  cup. ? 1 serving of seeds equals 1 tablespoon.   General information  Eat more home-cooked food. Eat less restaurant, buffet, and fast food.  Limit or avoid alcohol.  Limit foods that are high in starch and sugar.  Avoid fried foods.  Lose weight if you are overweight.  Keep track of how much salt (sodium) you eat. This is important if you have high blood pressure. Ask your doctor to tell you more about this.  Try to add vegetarian meals each week. Fats  Choose healthy fats. These include olive oil and canola oil, flaxseeds, walnuts, almonds, and seeds.  Eat more omega-3 fats. These include salmon, mackerel, sardines, tuna, flaxseed oil, and ground flaxseeds. Try to eat fish at least 2 times each week.  Check food labels. Avoid foods with trans fats or high amounts of saturated fat.  Limit saturated fats. ? These are often found in animal products, such as meats, butter, and cream. ? These are also found in plant foods, such as palm oil, palm kernel oil, and coconut oil.  Avoid foods  with partially hydrogenated oils in them. These have trans fats. Examples are stick margarine, some tub margarines, cookies, crackers, and other baked goods. What foods can I eat? Fruits All fresh, canned (in natural juice), or frozen fruits. Vegetables Fresh or frozen vegetables (raw, steamed, roasted, or grilled). Green salads. Grains Most grains. Choose whole wheat and whole grains most of the time. Rice and pasta, including brown rice and pastas made with whole wheat. Meats and other proteins Lean, well-trimmed beef, veal, pork, and lamb. Chicken and [place] without skin. All fish and shellfish. Wild duck, rabbit, pheasant, and venison. Egg whites or low-cholesterol egg substitutes. Dried beans, peas, lentils, and tofu. Seeds and most nuts. Dairy Low-fat or nonfat cheeses, including ricotta and mozzarella. Skim or 1% milk that is liquid, powdered, or evaporated. Buttermilk that is made with low-fat milk. Nonfat or low-fat yogurt. Fats and oils Non-hydrogenated (trans-free) margarines. Vegetable oils, including soybean, sesame, sunflower, olive, peanut, safflower, corn, canola, and cottonseed. Salad dressings or mayonnaise made with a vegetable oil. Beverages Mineral water. Coffee and tea. Diet carbonated beverages. Sweets and desserts Sherbet, gelatin, and fruit ice. Small amounts of dark chocolate. Limit all sweets and desserts. Seasonings and condiments All seasonings and condiments. The items listed above may not be a complete list of foods and drinks you can eat. Contact a dietitian for more options. What foods should I avoid? Fruits Canned fruit in heavy syrup. Fruit in cream or butter sauce. Fried fruit. Limit coconut. Vegetables Vegetables cooked in cheese, cream, or butter sauce. Fried vegetables. Grains Breads that are made with saturated or trans fats, oils, or whole milk. Croissants. Sweet rolls. Donuts. High-fat crackers, such as cheese crackers. Meats and other  proteins Fatty meats, such as hot dogs, ribs, sausage, bacon, rib-eye roast or steak. High-fat deli meats, such as salami and bologna. Caviar. Domestic duck and goose. Organ meats, such as liver. Dairy Cream, sour cream, cream cheese, and creamed cottage cheese. Whole-milk cheeses. Whole or 2% milk that is liquid, evaporated, or condensed. Whole buttermilk. Cream sauce or high-fat cheese sauce. Yogurt that is made from whole milk. Fats and oils Meat fat, or shortening. Cocoa butter, hydrogenated oils, palm oil, coconut oil, palm kernel oil. Solid fats and shortenings, including bacon fat, salt pork, lard, and butter. Nondairy cream substitutes. Salad dressings with cheese or sour cream. Beverages Regular sodas and juice drinks with added sugar. Sweets and desserts Frosting. Pudding. Cookies. Cakes. Pies. Milk chocolate or white chocolate. Buttered syrups. Full-fat ice cream or ice cream drinks. The items listed above may not be a complete list of foods and drinks to avoid. Contact a dietitian for more information. Summary  Heart-healthy meal planning includes eating less unhealthy fats, eating more healthy fats, and making other changes in your diet.  Eat a balanced diet. This includes fruits and vegetables, low-fat or nonfat dairy, lean protein, nuts and legumes, whole grains, and heart-healthy oils and fats. This information is not intended to replace advice given to you by your health care provider. Make sure you  discuss any questions you have with your health care provider. Document Revised: 09/18/2017 Document Reviewed: 08/22/2017 Elsevier Patient Education  2021 [place].   Femoral Site Care  This sheet gives you information about how to care for yourself after your procedure. Your health care provider may also give you more specific instructions. If you have problems or questions, contact your health care provider. What can I expect after the procedure? After the procedure, it is  common to have:  Bruising that usually fades within 1-2 weeks.  Tenderness at the site. Follow these instructions at home: Wound care  Follow instructions from your health care provider about how to take care of your insertion site. Make sure you: ? Wash your hands with soap and water before you change your bandage (dressing). If soap and water are not available, use hand sanitizer. ? Change your dressing as told by your health care provider. ? Leave stitches (sutures), skin glue, or adhesive strips in place. These skin closures may need to stay in place for 2 weeks or longer. If adhesive strip edges start to loosen and curl up, you may trim the loose edges. Do not remove adhesive strips completely unless your health care provider tells you to do that.  Do not take baths, swim, or use a hot tub until your health care provider approves.  You may shower 24-48 hours after the procedure or as told by your health care provider. ? Gently wash the site with plain soap and water. ? Pat the area dry with a clean towel. ? Do not rub the site. This may cause bleeding.  Do not apply powder or lotion to the site. Keep the site clean and dry.  Check your femoral site every day for signs of infection. Check for: ? Redness, swelling, or pain. ? Fluid or blood. ? Warmth. ? Pus or a bad smell. Activity  For the first 2-3 days after your procedure, or as long as directed: ? Avoid climbing stairs as much as possible. ? Do not squat.  Do not lift anything that is heavier than 10 lb (4.5 kg), or the limit that you are told, until your health care provider says that it is safe.  Rest as directed. ? Avoid sitting for a long time without moving. Get up to take short walks every 1-2 hours.  Do not drive for 24 hours if you were given a medicine to help you relax (sedative). General instructions  Take over-the-counter and prescription medicines only as told by your health care provider.  Keep all  follow-up visits as told by your health care provider. This is important. Contact a health care provider if you have:  A fever or chills.  You have redness, swelling, or pain around your insertion site. Get help right away if:  The catheter insertion area swells very fast.  You pass out.  You suddenly start to sweat or your skin gets clammy.  The catheter insertion area is bleeding, and the bleeding does not stop when you hold steady pressure on the area.  The area near or just beyond the catheter insertion site becomes pale, cool, tingly, or numb. These symptoms may represent a serious problem that is an emergency. Do not wait to see if the symptoms will go away. Get medical help right away. Call your local emergency services (911 in the [place] Do not drive yourself to the hospital. Summary  After the procedure, it is common to have bruising that usually fades within 1-2  weeks.  Check your femoral site every day for signs of infection.  Do not lift anything that is heavier than 10 lb (4.5 kg), or the limit that you are told, until your health care provider says that it is safe. This information is not intended to replace advice given to you by your health care provider. Make sure you discuss any questions you have with your health care provider. Document Revised: 03/17/2020 Document Reviewed: 03/17/2020 Elsevier Patient Education  [place].

## 2020-09-13 NOTE — Progress Notes (Signed)
ANTICOAGULATION CONSULT NOTE - Follow Up Consult  Pharmacy Consult for heparin Indication: chest pain/ACS and DVT  No Known Allergies  Patient Measurements: Height: 5\' 6"  (167.6 cm) Weight: 79.3 kg (174 lb 13.2 oz) IBW/kg (Calculated) : 63.8 Heparin Dosing Weight: 73.9 kg  Vital Signs: Temp: 98.2 F (36.8 C) (02/16 0836) Temp Source: Oral (02/16 0836) BP: 92/66 (02/16 0836) Pulse Rate: 90 (02/16 0836)  Labs: Recent Labs    09/11/20 0439 09/11/20 0921 09/11/20 1042 09/11/20 1102 09/11/20 1912 09/12/20 0200 09/12/20 0345 09/12/20 1642 09/13/20 0159  HGB 11.7*  12.1*  --   --   --   --   --  11.4* 12.6* 12.3*  HCT 36.6*  --   --   --   --   --  35.2* 37.0* 36.5*  PLT 172  --   --   --   --   --  181  --  249  HEPARINUNFRC  --   --  0.35  --   --   --  0.30  --  <0.10*  CREATININE 2.28*  --   --   --   --   --  2.22*  --  2.04*  CKTOTAL  --    < >  --    < > 4,981* 4,836*  --   --  3,319*  CKMB  --    < >  --   --  32.2* 32.4*  --   --  28.6*  TROPONINIHS  --   --  2,496*  --  1,970* 1,996*  --   --  2,291*   < > = values in this interval not displayed.    Estimated Creatinine Clearance: 35.7 mL/min (A) (by C-G formula based on SCr of 2.04 mg/dL (H)).   Medications:  Scheduled:  . aspirin  81 mg Oral Daily  . atorvastatin  80 mg Oral Daily  . cyanocobalamin  1,000 mcg Intramuscular Daily  . feeding supplement  237 mL Oral TID BM  . insulin aspart  0-15 Units Subcutaneous TID WC  . insulin aspart  0-5 Units Subcutaneous QHS  . insulin glargine  35 Units Subcutaneous BID  . methylPREDNISolone (SOLU-MEDROL) injection  40 mg Intravenous Daily  . metoprolol succinate  25 mg Oral Daily  . multivitamin with minerals  1 tablet Oral Daily  . pantoprazole  40 mg Oral Q1200  . ticagrelor  90 mg Oral BID  . warfarin  7.5 mg Oral ONCE-1600  . Warfarin - Pharmacist Dosing Inpatient   Does not apply q1600   Infusions:  . sodium chloride    . cefTRIAXone (ROCEPHIN)  IV 2  g (09/12/20 1205)  . heparin 1,500 Units/hr (09/13/20 1014)    Assessment: 65 yo M on heparin post cath for RLE DVT.  He is on brilinta due to in-stent restenosis on plavix.  Plans are to start warfarin due to concurrent brilinta -baseline INR= 1.0 -day 1 of overlap -hg= 12.3 -sq heparin  Goal of Therapy:  Heparin level 0.3-0.7 units/ml Monitor platelets by anticoagulation protocol: Yes   Plan:  Restart heparin at 1500 units/hr. Heparin level in 6 hours and daily wth CBC daily Warfarin 7.5mg  po today Daily PT/INR Will check into lovenox cost in case needed at discharge  Hildred Laser, PharmD Clinical Pharmacist **Pharmacist phone directory can now be found on [email or web address] ([email or web address]).  Listed under [place].

## 2020-09-13 NOTE — Progress Notes (Addendum)
Progress Note  Patient Name: Eugene [name] Date of Encounter: 09/13/2020  [facility] Cardiologist: Cristopher Peru, MD   Subjective   Denies any CP or SOB.   Inpatient Medications    Scheduled Meds: . aspirin  81 mg Oral Daily  . atorvastatin  80 mg Oral Daily  . cyanocobalamin  1,000 mcg Intramuscular Daily  . feeding supplement  237 mL Oral TID BM  . heparin  5,000 Units Subcutaneous Q8H  . insulin aspart  0-15 Units Subcutaneous TID WC  . insulin aspart  0-5 Units Subcutaneous QHS  . insulin glargine  35 Units Subcutaneous BID  . methylPREDNISolone (SOLU-MEDROL) injection  40 mg Intravenous Daily  . metoprolol succinate  25 mg Oral Daily  . multivitamin with minerals  1 tablet Oral Daily  . pantoprazole  40 mg Oral Q1200  . ticagrelor  90 mg Oral BID   Continuous Infusions: . sodium chloride    . cefTRIAXone (ROCEPHIN)  IV 2 g (09/12/20 1205)   PRN Meds: sodium chloride, acetaminophen, nitroGLYCERIN, ondansetron (ZOFRAN) IV, sodium chloride flush   Vital Signs    Vitals:   09/12/20 2250 09/12/20 2255 09/13/20 0048 09/13/20 0413  BP: 125/85 112/84 101/61 109/69  Pulse: 96 99 (!) 106 (!) 107  Resp:      Temp:   97.7 F (36.5 C) (!) 97.5 F (36.4 C)  TempSrc:   Oral Oral  SpO2: 100% 100% 100% 100%  Weight:    79.3 kg  Height:        Intake/Output Summary (Last 24 hours) at 09/13/2020 0752 Last data filed at 09/13/2020 0310 Gross per 24 hour  Intake 1022.06 ml  Output 3200 ml  Net -2177.94 ml   Last 3 Weights 09/13/2020 09/12/2020 09/08/2020  Weight (lbs) 174 lb 13.2 oz 163 lb 2.3 oz 163 lb  Weight (kg) 79.3 kg 74 kg 73.936 kg      Telemetry    NSR without significant ventricular ectopy - Personally Reviewed  ECG     - Personally Reviewed  Physical Exam   GEN: No acute distress.   Neck: No JVD Cardiac: RRR, no murmurs, rubs, or gallops.  Respiratory: Clear to auscultation bilaterally. GI: Soft, nontender, non-distended  MS: No edema; No  deformity. Neuro:  Nonfocal  Psych: Normal affect   Labs    High Sensitivity Troponin:   Recent Labs  Lab 09/10/20 2032 09/11/20 0335 09/11/20 1042 09/11/20 1912 09/12/20 0200  TROPONINIHS 2,237* 2,710* 2,496* 1,970* 1,996*      Chemistry Recent Labs  Lab 09/11/20 0439 09/12/20 0345 09/12/20 1642 09/13/20 0159  NA 139 139 113* 139  K 4.3 3.6 3.3* 4.1  CL 110 109  --  111  CO2 20* 21*  --  14*  GLUCOSE 169* 141*  --  273*  BUN 42* 38*  --  42*  CREATININE 2.28* 2.22*  --  2.04*  CALCIUM 8.9 9.0  --  9.3  PROT 6.3* 6.0*  --  6.6  ALBUMIN 2.5* 2.5*  --  2.6*  AST 146* 150*  --  130*  ALT 150* 155*  --  161*  ALKPHOS 54 57  --  60  BILITOT 0.9 0.8  --  1.2  GFRNONAA 31* 32*  --  36*  ANIONGAP 9 9  --  14     Hematology Recent Labs  Lab 09/11/20 0439 09/12/20 0345 09/12/20 1642 09/13/20 0159  WBC 11.5* 11.4*  --  22.3*  RBC 4.12* 3.99*  --  4.21*  HGB 11.7*  12.1* 11.4* 12.6* 12.3*  HCT 36.6* 35.2* 37.0* 36.5*  MCV 88.8 88.2  --  86.7  MCH 28.4 28.6  --  29.2  MCHC 32.0 32.4  --  33.7  RDW 14.8 14.7  --  15.4  PLT 172 181  --  249    BNP Recent Labs  Lab 09/11/20 0432 09/12/20 0345 09/13/20 0159  BNP 556.4* 359.0* 569.4*     DDimer  Recent Labs  Lab 09/11/20 0439 09/12/20 0345 09/13/20 0159  DDIMER 0.75* 0.84* 1.03*     Radiology    CARDIAC CATHETERIZATION  Result Date: 09/12/2020  Prox LAD lesion is 99% stenosed.  Ost LAD to Prox LAD lesion is 100% stenosed.  Mid LAD lesion is 80% stenosed.  1st Mrg lesion is 99% stenosed.  Ost Cx to Prox Cx lesion is 80% stenosed.  Mid Cx lesion is 100% stenosed.  Balloon angioplasty was performed.  Prox RCA to Dist RCA lesion is 100% stenosed with 100% stenosed side branch in RPAV.  Ost Ramus lesion is 100% stenosed.  SVG and is normal in caliber.  Previously placed Prox Graft drug eluting stent is widely patent.  LIMA and is large.  The graft exhibits no disease.  SVG and is normal in  caliber.  Origin to Prox Graft lesion is 100% stenosed.  SVG and is large.  Prox Graft to Dist Graft lesion is 100% stenosed.  Dist Cx lesion is 95% stenosed.  Mid LM to Dist LM lesion is 80% stenosed.  Post intervention, there is a 20% residual stenosis.  Severe native coronary obstructive disease 80% distal left main stenosis, total occlusion of the LAD, ramus immediate vessel, high-grade circumflex stenoses, total occlusion of the proximal RCA.  There was a shunt to the distal RCA via left to right and right to right collateralization. Patent LIMA graft supplying the mid LAD with diffuse disease in the LAD after insertion. Supplying the OM 2 vessel with widely patent stent in the proximal portion of the SVG.  There is 95% in-stent restenosis in the native OM 2 vessel beyond the anastomosis. Old occlusion of the vein graft which had supplied the ramus vessel. Old occlusion of the vein graft which had supplied the PDA. Difficult but ultimately successful intervention through the SVG to the distal OM 2 vessel requiring PTCA, [name] Cutting Balloon, and ultimate noncompliant balloon dilatation with a 95% stenosis being reduced to less than 20%. LVEDP: 11 mmHg RECOMMENDATION: DAPT probably indefinitely.  Since the patient developed early in-stent restenosis on aspirin/Plavix, Brilinta was utilized for more aggressive antiplatelet therapy.  Medical therapy for concomitant CAD.  Optimal blood pressure control, diabetes management, and aggressive lipid therapy with target LDL less than 70.    Cardiac Studies   Echo 09/09/2020 1. Left ventricular ejection fraction, by estimation, is 50 to 55%. The  left ventricle has low normal function. The left ventricle has no regional  wall motion abnormalities. Left ventricular diastolic parameters are  consistent with Grade I diastolic  dysfunction (impaired relaxation).  2. Right ventricular systolic function is normal.  3. The mitral valve is normal in  structure. No evidence of mitral valve  regurgitation.  4. The aortic valve is tricuspid. There is mild calcification of the  aortic valve. There is mild thickening of the aortic valve. Aortic valve  regurgitation is not visualized. No aortic stenosis is present.   Comparison(s): A prior study was performed on 08/16/2020. No significant  change from prior  study. Prior images reviewed side by side.    Cath 09/12/2020  Prox LAD lesion is 99% stenosed.  Ost LAD to Prox LAD lesion is 100% stenosed.  Mid LAD lesion is 80% stenosed.  1st Mrg lesion is 99% stenosed.  Ost Cx to Prox Cx lesion is 80% stenosed.  Mid Cx lesion is 100% stenosed.  Balloon angioplasty was performed.  Prox RCA to Dist RCA lesion is 100% stenosed with 100% stenosed side branch in RPAV.  Ost Ramus lesion is 100% stenosed.  SVG and is normal in caliber.  Previously placed Prox Graft drug eluting stent is widely patent.  LIMA and is large.  The graft exhibits no disease.  SVG and is normal in caliber.  Origin to Prox Graft lesion is 100% stenosed.  SVG and is large.  Prox Graft to Dist Graft lesion is 100% stenosed.  Dist Cx lesion is 95% stenosed.  Mid LM to Dist LM lesion is 80% stenosed.  Post intervention, there is a 20% residual stenosis.   Severe native coronary obstructive disease 80% distal left main stenosis, total occlusion of the LAD, ramus immediate vessel, high-grade circumflex stenoses, total occlusion of the proximal RCA.  There was a shunt to the distal RCA via left to right and right to right collateralization.  Patent LIMA graft supplying the mid LAD with diffuse disease in the LAD after insertion.  Supplying the OM 2 vessel with widely patent stent in the proximal portion of the SVG.  There is 95% in-stent restenosis in the native OM 2 vessel beyond the anastomosis.  Old occlusion of the vein graft which had supplied the ramus vessel.  Old occlusion of the vein graft  which had supplied the PDA.  Difficult but ultimately successful intervention through the SVG to the distal OM 2 vessel requiring PTCA, [name] Cutting Balloon, and ultimate noncompliant balloon dilatation with a 95% stenosis being reduced to less than 20%.  LVEDP: 11 mmHg  RECOMMENDATION: DAPT probably indefinitely.  Since the patient developed early in-stent restenosis on aspirin/Plavix, Brilinta was utilized for more aggressive antiplatelet therapy.  Medical therapy for concomitant CAD.  Optimal blood pressure control, diabetes management, and aggressive lipid therapy with target LDL less than 70.   Patient Profile     65 y.o. male with PMH of CAD s/p CABG, HLD, DM II, CKD, h/o VFib s/p ICD presented with 1 week of leg weakness and chest pain. He was tested positive for COVID 19, elevated transaminase and ruled in for NSTEMI. Trop peaked 50354. LE venous doppler 2/12 positive for acute R DVT involving R popliteal vein, R perineal veins and R gastrocnemius veins. Urine culture grew klebsiella. CT of chest 09/09/2020 showed fibrotic interstitial lung disease.   Assessment & Plan    1. NSTEMI  - cath 09/12/2020 patent LIMA to mLAD, patent stent in SVG-OM2 that was previously placed in Dec 2021, 95% in-stent restenosis in the native OM2 beyong anastomosis, chronically occluded SVG to ramus and SVG-PDA. Native OM2 ISR was treated with balloon angioplasty.   - due to early in-stent restenosis on aspirin and plavix, plavix was switched to Brilinta with recommendation of indefinite DAPT  - since there was concern of ineffective plavix with early in-stent restenosis and he was switched to Brilinta which is not a ideal triple therapy candidate, he will be discharged on combination of Brilint and coumadin  - chest pain resolved post PTCA  2. Bilateral lower extremity weakness  - elevated CK and sed rate, outpatient surgery  and rheumatology follow up to decide whether to proceed with muscle  biopsy  3. RLE DVT: seen on lower venous doppler, on IV heparin  - currently on subcu heparin instead of IV heparin. Pharmacist will start on lovenox with coumadin  4. CAD s/p CABG  - cath 07/06/2020 DES to SVG-OM2, and DES to OM2 distal to SVG anastomosis  - Echo 08/16/2020 with EF 50-55%  5. HLD  6. DM II  7. Acute vs CKD stage IV  8. H/o Vfib s/p ICD  9. Klebsiella UTI: seen on urine culture, on abx by primary team  10: interstitial lung disease: seen on CT of chest 2/12, followed up pulmonology       For questions or updates, please contact [place] Please consult [email or web address] for contact info under        Signed, Almyra Deforest, [place]  09/13/2020, 7:52 AM    Patient seen and examined with Almyra Deforest PA.  Agree as above, with the following exceptions and changes as noted below. Successful PTCA yesterday with improved symptoms. Gen: NAD, CV: RRR, no murmurs, Lungs: clear, Abd: soft, Extrem: Warm, well perfused, no edema, Neuro/Psych: alert and oriented x 3, normal mood and affect. All available labs, radiology testing, previous records reviewed.  - will likely need indefinite antiplatelet therapy with ASA and Brilinta. Since he currently has a DVT, he will need dual therapy with Brilinta and warfarin. Duration of warfarin to be determined by primary service, after which, when warfarin is stopped, ASA should be resumed for indefinite DAPT.  I have instructed the patient that dual antiplatelet therapy should be taken for 1 year without interruption.  We have discussed the consequences of interrupted dual antiplatelet therapy and the risk for in-stent thrombosis. Agree with rheumatology follow up as outpatient given possible myositis.    Elouise Munroe, MD 09/13/20 12:35 PM

## 2020-09-13 NOTE — TOC Benefit Eligibility Note (Signed)
Transition of Care [facility]) Benefit Eligibility Note    Patient Details  Name: Eugene [name] MRN: [id number] Date of Birth: [birth year]   Medication/Dose: Arne Cleveland  2.5 MG BID CO-PAY- $45.00   , ELIQUIS  5 MG BID CO-PAY- $45.00      ELIQUIS  10MG  BID : NON-FORMULARY  P/A-YE # [id number]  Covered?: Yes  Tier: 3 Drug  Prescription Coverage [facility]  Spoke with Person/Company/Phone Number:: SEQUOYAH  and  CHRISTIAN @  [employer] # [phone] OPT-  Co-Pay: $45.00  Prior Approval: No  Deductible:  (NO DEDUCTIBLE WITH PLAN)  Additional Notes: BRILINTA  90 MG BID COVER- YES   CO-PAY- $ 45.00   TIER-3 DRUG  P/A-NO    Memory Argue Phone Number: 09/13/2020, 3:44 PM

## 2020-09-14 ENCOUNTER — Inpatient Hospital Stay (HOSPITAL_COMMUNITY): Payer: Medicare HMO

## 2020-09-14 DIAGNOSIS — I4891 Unspecified atrial fibrillation: Secondary | ICD-10-CM

## 2020-09-14 DIAGNOSIS — J849 Interstitial pulmonary disease, unspecified: Secondary | ICD-10-CM | POA: Diagnosis not present

## 2020-09-14 DIAGNOSIS — I214 Non-ST elevation (NSTEMI) myocardial infarction: Secondary | ICD-10-CM | POA: Diagnosis not present

## 2020-09-14 LAB — HEPARIN LEVEL (UNFRACTIONATED)
Heparin Unfractionated: 0.43 IU/mL (ref 0.30–0.70)
Heparin Unfractionated: 0.55 IU/mL (ref 0.30–0.70)

## 2020-09-14 LAB — PROTIME-INR
INR: 1.1 (ref 0.8–1.2)
Prothrombin Time: 13.4 s (ref 11.4–15.2)

## 2020-09-14 LAB — COMPREHENSIVE METABOLIC PANEL
ALT: 152 U/L — ABNORMAL HIGH (ref 0–44)
AST: 124 U/L — ABNORMAL HIGH (ref 15–41)
Albumin: 2.4 g/dL — ABNORMAL LOW (ref 3.5–5.0)
Alkaline Phosphatase: 54 U/L (ref 38–126)
Anion gap: 12 (ref 5–15)
BUN: 46 mg/dL — ABNORMAL HIGH (ref 8–23)
CO2: 20 mmol/L — ABNORMAL LOW (ref 22–32)
Calcium: 9.1 mg/dL (ref 8.9–10.3)
Chloride: 110 mmol/L (ref 98–111)
Creatinine, Ser: 2.04 mg/dL — ABNORMAL HIGH (ref 0.61–1.24)
GFR, Estimated: 36 mL/min — ABNORMAL LOW (ref >60)
Glucose, Bld: 129 mg/dL — ABNORMAL HIGH (ref 70–99)
Potassium: 3.6 mmol/L (ref 3.5–5.1)
Sodium: 142 mmol/L (ref 135–145)
Total Bilirubin: 0.5 mg/dL (ref 0.3–1.2)
Total Protein: 5.7 g/dL — ABNORMAL LOW (ref 6.5–8.1)

## 2020-09-14 LAB — C-REACTIVE PROTEIN: CRP: 0.8 mg/dL

## 2020-09-14 LAB — GLUCOSE, CAPILLARY
Glucose-Capillary: 95 mg/dL (ref 70–99)
Glucose-Capillary: 259 mg/dL — ABNORMAL HIGH (ref 70–99)
Glucose-Capillary: 256 mg/dL — ABNORMAL HIGH (ref 70–99)
Glucose-Capillary: 289 mg/dL — ABNORMAL HIGH (ref 70–99)

## 2020-09-14 LAB — CBC
HCT: 32 % — ABNORMAL LOW (ref 39.0–52.0)
Hemoglobin: 10.3 g/dL — ABNORMAL LOW (ref 13.0–17.0)
MCH: 28.7 pg (ref 26.0–34.0)
MCHC: 32.2 g/dL (ref 30.0–36.0)
MCV: 89.1 fL (ref 80.0–100.0)
Platelets: 230 10*3/uL (ref 150–400)
RBC: 3.59 MIL/uL — ABNORMAL LOW (ref 4.22–5.81)
RDW: 15.7 % — ABNORMAL HIGH (ref 11.5–15.5)
WBC: 17.1 10*3/uL — ABNORMAL HIGH (ref 4.0–10.5)
nRBC: 0 % (ref 0.0–0.2)

## 2020-09-14 LAB — MAGNESIUM: Magnesium: 2.3 mg/dL (ref 1.7–2.4)

## 2020-09-14 LAB — ACETYLCHOLINE RECEPTOR AB, ALL
Acety choline binding ab: <0.03 nmol/L (ref 0.00–0.24)
Acetylchol Block Ab: 15 % (ref 0–25)

## 2020-09-14 LAB — TROPONIN I (HIGH SENSITIVITY): Troponin I (High Sensitivity): 15368 ng/L (ref <18)

## 2020-09-14 LAB — ECHOCARDIOGRAM LIMITED
Height: 66 in
S' Lateral: 3.8 cm
Weight: 2694.9 oz

## 2020-09-14 LAB — CK TOTAL AND CKMB (NOT AT ARMC)
CK, MB: 70.8 ng/mL — ABNORMAL HIGH (ref 0.5–5.0)
Relative Index: 2.9 — ABNORMAL HIGH (ref 0.0–2.5)
Total CK: 2423 U/L — ABNORMAL HIGH (ref 49–397)

## 2020-09-14 LAB — BRAIN NATRIURETIC PEPTIDE: B Natriuretic Peptide: 988.4 pg/mL — ABNORMAL HIGH (ref 0.0–100.0)

## 2020-09-14 MED ORDER — LACTATED RINGERS IV SOLN: INTRAVENOUS | Status: AC

## 2020-09-14 MED ORDER — ENOXAPARIN SODIUM 80 MG/0.8ML ~~LOC~~ SOLN
80.0000 mg | Freq: Every day | SUBCUTANEOUS | Status: DC
  Administered 2020-09-14 – 2020-09-15 (×2): 80 mg via SUBCUTANEOUS
  Filled 2020-09-14 (×2): qty 0.8

## 2020-09-14 MED ORDER — PERFLUTREN LIPID MICROSPHERE
1.0000 mL | INTRAVENOUS | Status: AC | PRN
  Administered 2020-09-14: 4 mL via INTRAVENOUS
  Filled 2020-09-14: qty 10

## 2020-09-14 MED ORDER — WARFARIN SODIUM 7.5 MG PO TABS
7.5000 mg | ORAL_TABLET | Freq: Once | ORAL | Status: AC
  Administered 2020-09-14: 7.5 mg via ORAL
  Filled 2020-09-14: qty 1

## 2020-09-14 MED ORDER — METOPROLOL SUCCINATE ER 25 MG PO TB24
37.5000 mg | ORAL_TABLET | Freq: Every day | ORAL | Status: DC
  Administered 2020-09-14 – 2020-09-16 (×3): 37.5 mg via ORAL
  Filled 2020-09-14 (×3): qty 2

## 2020-09-14 NOTE — Progress Notes (Signed)
  Echocardiogram 2D Echocardiogram has been performed.  Johny Chess 09/14/2020, 1:45 PM

## 2020-09-14 NOTE — Progress Notes (Signed)
ANTICOAGULATION CONSULT NOTE - Follow Up Consult  Pharmacy Consult for Heparin, warfarin  Indication: chest pain/ACS and DVT  No Known Allergies  Patient Measurements: Height: 5\' 6"  (167.6 cm) Weight: 76.4 kg (168 lb 6.9 oz) IBW/kg (Calculated) : 63.8 Heparin Dosing Weight: 73.9 kg  Vital Signs: Temp: 97.7 F (36.5 C) (02/17 0924) Temp Source: Oral (02/17 0924) BP: 91/64 (02/17 0924) Pulse Rate: 98 (02/17 0924)  Labs: Recent Labs    09/12/20 0200 09/12/20 0345 09/12/20 0345 09/12/20 1642 09/13/20 0159 09/13/20 1950 09/14/20 0300 09/14/20 0551 09/14/20 1018  HGB  --  11.4*   < > 12.6* 12.3*  --  10.3*  --   --   HCT  --  35.2*   < > 37.0* 36.5*  --  32.0*  --   --   PLT  --  181  --   --  249  --  230  --   --   LABPROT  --   --   --   --   --   --  13.4  --   --   INR  --   --   --   --   --   --  1.1  --   --   HEPARINUNFRC  --  0.30   < >  --  <0.10* 0.27* 0.43  --  0.55  CREATININE  --  2.22*  --   --  2.04*  --  2.04*  --   --   CKTOTAL 3,086*  --   --   --  3,319*  --  2,423*  --   --   CKMB 32.4*  --   --   --  28.6*  --  70.8*  --   --   TROPONINIHS 1,996*  --   --   --  2,291*  --   --  15,368*  --    < > = values in this interval not displayed.    Estimated Creatinine Clearance: 32.6 mL/min (A) (by C-G formula based on SCr of 2.04 mg/dL (H)).  Assessment: 65 yr old male on heparin post cath for RLE DVT.  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  Warfarin started 2/16. Plans ar to change to lovenox (cost for 5 days is about $95 and he is aware and able to pay the cost) -INR= 1.1 -hg= 10.3 -SCr= 2.04, CrCL ~ 30   Goal of Therapy:  Heparin level 0.3-0.7 units/ml Monitor platelets by anticoagulation protocol: Yes   Plan:  -Warfarin 7.5mg  today -discontinue heparin -lovenox 80mg  sq q24h -Daily INR -CBC in am  Hildred Laser, PharmD Clinical Pharmacist **Pharmacist phone directory can now be found on [email or web address] ([email or web address]).  Listed under [place].

## 2020-09-14 NOTE — Progress Notes (Signed)
Central TELE called that pt.went on A FIB.;EKG done & results relayed to MD on call but went back to SR .after EKG done.Will cont.to monitor pt.

## 2020-09-14 NOTE — Progress Notes (Signed)
ANTICOAGULATION CONSULT NOTE - Follow Up Consult  Pharmacy Consult for Heparin Indication: chest pain/ACS and DVT  No Known Allergies  Patient Measurements: Height: 5\' 6"  (167.6 cm) Weight: 76.4 kg (168 lb 6.9 oz) IBW/kg (Calculated) : 63.8 Heparin Dosing Weight: 73.9 kg  Vital Signs: Temp: 97.9 F (36.6 C) (02/17 0348) Temp Source: Oral (02/17 0348) BP: 100/62 (02/17 0348) Pulse Rate: 77 (02/17 0348)  Labs: Recent Labs    09/11/20 0439 09/11/20 0921 09/11/20 1912 09/12/20 0200 09/12/20 0345 09/12/20 1642 09/13/20 0159 09/13/20 1950 09/14/20 0300  HGB 11.7*  12.1*  --   --   --  11.4* 12.6* 12.3*  --  10.3*  HCT 36.6*  --   --   --  35.2* 37.0* 36.5*  --  32.0*  PLT 172  --   --   --  181  --  249  --  230  HEPARINUNFRC  --    < >  --   --  0.30  --  <0.10* 0.27* 0.43  CREATININE 2.28*  --   --   --  2.22*  --  2.04*  --   --   CKTOTAL  --    < > 4,981* 4,836*  --   --  3,319*  --   --   CKMB  --    < > 32.2* 32.4*  --   --  28.6*  --   --   TROPONINIHS  --    < > 1,970* 1,996*  --   --  2,291*  --   --    < > = values in this interval not displayed.    Estimated Creatinine Clearance: 32.6 mL/min (A) (by C-G formula based on SCr of 2.04 mg/dL (H)).  Assessment: 65 yr old male on heparin post cath for RLE DVT.  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  Warfarin restarted.  Heparin level therapeutic (0.43) on gtt at 1650 units/hr. No bleeding noted.  Goal of Therapy:  Heparin level 0.3-0.7 units/ml Monitor platelets by anticoagulation protocol: Yes   Plan:  Continue heparin infusion at 1650 units/hr Recheck heparin level in 6 hrs  Sherlon Handing, PharmD, BCPS Please see amion for complete clinical pharmacist phone list 09/14/2020 4:01 AM

## 2020-09-14 NOTE — Progress Notes (Addendum)
Progress Note  Patient Name: Eugene [name] Date of Encounter: 09/14/2020  [facility] Cardiologist: Cristopher Peru, MD   Subjective   Denies any CP or SOB  Inpatient Medications    Scheduled Meds: . aspirin  81 mg Oral Daily  . atorvastatin  80 mg Oral Daily  . cyanocobalamin  1,000 mcg Intramuscular Daily  . feeding supplement  237 mL Oral TID BM  . insulin aspart  0-15 Units Subcutaneous TID WC  . insulin aspart  0-5 Units Subcutaneous QHS  . insulin glargine  35 Units Subcutaneous BID  . methylPREDNISolone (SOLU-MEDROL) injection  40 mg Intravenous Daily  . metoprolol succinate  25 mg Oral Daily  . multivitamin with minerals  1 tablet Oral Daily  . pantoprazole  40 mg Oral Q1200  . ticagrelor  90 mg Oral BID  . Warfarin - Pharmacist Dosing Inpatient   Does not apply q1600   Continuous Infusions: . sodium chloride    . heparin 1,650 Units/hr (09/13/20 2114)  . lactated ringers 100 mL/hr at 09/14/20 0730   PRN Meds: sodium chloride, acetaminophen, nitroGLYCERIN, ondansetron (ZOFRAN) IV, sodium chloride flush   Vital Signs    Vitals:   09/13/20 2202 09/13/20 2312 09/13/20 2345 09/14/20 0348  BP: 112/76 99/69 104/62 100/62  Pulse: 98 93 89 77  Resp: 18 19 16 18   Temp: 98.1 F (36.7 C) 97.7 F (36.5 C) 98.1 F (36.7 C) 97.9 F (36.6 C)  TempSrc: Oral Oral Oral Oral  SpO2: 99% 97% 95% 98%  Weight:    76.4 kg  Height:        Intake/Output Summary (Last 24 hours) at 09/14/2020 0755 Last data filed at 09/14/2020 0600 Gross per 24 hour  Intake 512.16 ml  Output 2400 ml  Net -1887.84 ml   Last 3 Weights 09/14/2020 09/13/2020 09/12/2020  Weight (lbs) 168 lb 6.9 oz 174 lb 13.2 oz 163 lb 2.3 oz  Weight (kg) 76.4 kg 79.3 kg 74 kg      Telemetry    Sinus rhythm, converted to afib - Personally Reviewed  ECG    Atrial fibrillation - Personally Reviewed  Physical Exam   GEN: No acute distress.   Neck: No JVD Cardiac: irregular, no murmurs, rubs, or gallops.   Respiratory: Clear to auscultation bilaterally. GI: Soft, nontender, non-distended  MS: No edema; No deformity. Neuro:  Nonfocal  Psych: Normal affect   Labs    High Sensitivity Troponin:   Recent Labs  Lab 09/11/20 0335 09/11/20 1042 09/11/20 1912 09/12/20 0200 09/13/20 0159  TROPONINIHS 2,710* 2,496* 1,970* 1,996* 2,291*      Chemistry Recent Labs  Lab 09/12/20 0345 09/12/20 1642 09/13/20 0159 09/14/20 0300  NA 139 113* 139 142  K 3.6 3.3* 4.1 3.6  CL 109  --  111 110  CO2 21*  --  14* 20*  GLUCOSE 141*  --  273* 129*  BUN 38*  --  42* 46*  CREATININE 2.22*  --  2.04* 2.04*  CALCIUM 9.0  --  9.3 9.1  PROT 6.0*  --  6.6 5.7*  ALBUMIN 2.5*  --  2.6* 2.4*  AST 150*  --  130* 124*  ALT 155*  --  161* 152*  ALKPHOS 57  --  60 54  BILITOT 0.8  --  1.2 0.5  GFRNONAA 32*  --  36* 36*  ANIONGAP 9  --  14 12     Hematology Recent Labs  Lab 09/12/20 0345 09/12/20 1642 09/13/20  0159 09/14/20 0300  WBC 11.4*  --  22.3* 17.1*  RBC 3.99*  --  4.21* 3.59*  HGB 11.4* 12.6* 12.3* 10.3*  HCT 35.2* 37.0* 36.5* 32.0*  MCV 88.2  --  86.7 89.1  MCH 28.6  --  29.2 28.7  MCHC 32.4  --  33.7 32.2  RDW 14.7  --  15.4 15.7*  PLT 181  --  249 230    BNP Recent Labs  Lab 09/12/20 0345 09/13/20 0159 09/14/20 0300  BNP 359.0* 569.4* 988.4*     DDimer  Recent Labs  Lab 09/11/20 0439 09/12/20 0345 09/13/20 0159  DDIMER 0.75* 0.84* 1.03*     Radiology    CARDIAC CATHETERIZATION  Result Date: 09/12/2020  Prox LAD lesion is 99% stenosed.  Ost LAD to Prox LAD lesion is 100% stenosed.  Mid LAD lesion is 80% stenosed.  1st Mrg lesion is 99% stenosed.  Ost Cx to Prox Cx lesion is 80% stenosed.  Mid Cx lesion is 100% stenosed.  Balloon angioplasty was performed.  Prox RCA to Dist RCA lesion is 100% stenosed with 100% stenosed side branch in RPAV.  Ost Ramus lesion is 100% stenosed.  SVG and is normal in caliber.  Previously placed Prox Graft drug eluting stent  is widely patent.  LIMA and is large.  The graft exhibits no disease.  SVG and is normal in caliber.  Origin to Prox Graft lesion is 100% stenosed.  SVG and is large.  Prox Graft to Dist Graft lesion is 100% stenosed.  Dist Cx lesion is 95% stenosed.  Mid LM to Dist LM lesion is 80% stenosed.  Post intervention, there is a 20% residual stenosis.  Severe native coronary obstructive disease 80% distal left main stenosis, total occlusion of the LAD, ramus immediate vessel, high-grade circumflex stenoses, total occlusion of the proximal RCA.  There was a shunt to the distal RCA via left to right and right to right collateralization. Patent LIMA graft supplying the mid LAD with diffuse disease in the LAD after insertion. Supplying the OM 2 vessel with widely patent stent in the proximal portion of the SVG.  There is 95% in-stent restenosis in the native OM 2 vessel beyond the anastomosis. Old occlusion of the vein graft which had supplied the ramus vessel. Old occlusion of the vein graft which had supplied the PDA. Difficult but ultimately successful intervention through the SVG to the distal OM 2 vessel requiring PTCA, [name] Cutting Balloon, and ultimate noncompliant balloon dilatation with a 95% stenosis being reduced to less than 20%. LVEDP: 11 mmHg RECOMMENDATION: DAPT probably indefinitely.  Since the patient developed early in-stent restenosis on aspirin/Plavix, Brilinta was utilized for more aggressive antiplatelet therapy.  Medical therapy for concomitant CAD.  Optimal blood pressure control, diabetes management, and aggressive lipid therapy with target LDL less than 70.    Cardiac Studies   Echo 09/09/2020 1. Left ventricular ejection fraction, by estimation, is 50 to 55%. The  left ventricle has low normal function. The left ventricle has no regional  wall motion abnormalities. Left ventricular diastolic parameters are  consistent with Grade I diastolic  dysfunction (impaired relaxation).   2. Right ventricular systolic function is normal.  3. The mitral valve is normal in structure. No evidence of mitral valve  regurgitation.  4. The aortic valve is tricuspid. There is mild calcification of the  aortic valve. There is mild thickening of the aortic valve. Aortic valve  regurgitation is not visualized. No aortic stenosis is present.  Comparison(s): A prior study was performed on 08/16/2020. No significant  change from prior study. Prior images reviewed side by side.    Cath 09/12/2020  Prox LAD lesion is 99% stenosed.  Ost LAD to Prox LAD lesion is 100% stenosed.  Mid LAD lesion is 80% stenosed.  1st Mrg lesion is 99% stenosed.  Ost Cx to Prox Cx lesion is 80% stenosed.  Mid Cx lesion is 100% stenosed.  Balloon angioplasty was performed.  Prox RCA to Dist RCA lesion is 100% stenosed with 100% stenosed side branch in RPAV.  Ost Ramus lesion is 100% stenosed.  SVG and is normal in caliber.  Previously placed Prox Graft drug eluting stent is widely patent.  LIMA and is large.  The graft exhibits no disease.  SVG and is normal in caliber.  Origin to Prox Graft lesion is 100% stenosed.  SVG and is large.  Prox Graft to Dist Graft lesion is 100% stenosed.  Dist Cx lesion is 95% stenosed.  Mid LM to Dist LM lesion is 80% stenosed.  Post intervention, there is a 20% residual stenosis.  Severe native coronary obstructive disease 80% distal left main stenosis, total occlusion of the LAD, ramus immediate vessel, high-grade circumflex stenoses, total occlusion of the proximal RCA. There was a shunt to the distal RCA via left to right and right to right collateralization.  Patent LIMA graft supplying the mid LAD with diffuse disease in the LAD after insertion.  Supplying the OM 2 vessel with widely patent stent in the proximal portion of the SVG. There is 95% in-stent restenosis in the native OM 2 vessel beyond the anastomosis.  Old occlusion of  the vein graft which had supplied the ramus vessel.  Old occlusion of the vein graft which had supplied the PDA.  Difficult but ultimately successful intervention through the SVG to the distal OM 2 vessel requiring PTCA, [name] Cutting Balloon, and ultimate noncompliant balloon dilatation with a 95% stenosis being reduced to less than 20%.  LVEDP: 11 mmHg  RECOMMENDATION: DAPT probably indefinitely. Since the patient developed early in-stent restenosis on aspirin/Plavix, Brilinta was utilized for more aggressive antiplatelet therapy. Medical therapy for concomitant CAD. Optimal blood pressure control, diabetes management, and aggressive lipid therapy with target LDL less than 70.  Patient Profile     65 y.o. male with PMH of CAD s/p CABG, HLD, DM II, CKD, h/o VFib s/p ICD presented with 1 week of leg weakness and chest pain. He was tested positive for COVID 19, elevated transaminase and ruled in for NSTEMI. Trop peaked 21308. LE venous doppler 2/12 positive for acute R DVT involving R popliteal vein, R perineal veins and R gastrocnemius veins. Urine culture grew klebsiella. CT of chest 09/09/2020 showed fibrotic interstitial lung disease. Patient went into afib on 2/17  Assessment & Plan    1. NSTEMI             - cath 09/12/2020 patent LIMA to mLAD, patent stent in SVG-OM2 that was previously placed in Dec 2021, 95% in-stent restenosis in the native OM2 beyong anastomosis, chronically occluded SVG to ramus and SVG-PDA. Native OM2 ISR was treated with balloon angioplasty.              - due to early in-stent restenosis on aspirin and plavix, plavix was switched to Brilinta with recommendation of indefinite DAPT             - switching to Brilinta + coumadin combination. Still on aspirin.  - significant  jump in troponin noted this morning, yesterday, it was 2200, today it is 15368. Total CK going down, CKmb and trop going up. This much of a jump can not be explained by onset of afib  yesterday alone. Concerned of ballooned area going down. Repeat EKG shows the same ST depression in lead I and aVL. Will discuss with MD  2. Bilateral lower extremity weakness             - elevated CK and sed rate, outpatient surgery and rheumatology follow up to decide whether to proceed with muscle biopsy  3. RLE DVT: seen on lower venous doppler, on IV heparin             - planning coumadin + Brilinta combination for DVT and stent  4. CAD s/p CABG             - cath 07/06/2020 DES to SVG-OM2, and DES to OM2 distal to SVG anastomosis             - Echo 08/16/2020 with EF 50-55%  5. HLD  6. DM II  7. Acute vs CKD stage IV  8. H/o Vfib s/p ICD  9. Klebsiella UTI: seen on urine culture, on abx by primary team  10: interstitial lung disease: seen on CT of chest 2/12, followed up pulmonology  11: COVID 19  12. Afib with RVR: new onset 2/16, starting on coumadin for DVT, increase metoprolol XL to 37.5 mg daily (instead of going up to 50mg ) since his SBP is around 165mmHg      For questions or updates, please contact [place] Please consult [email or web address] for contact info under        Signed, Almyra Deforest, [place]  09/14/2020, 7:55 AM    Patient seen and examined with Almyra Deforest PA.  Agree as above, with the following exceptions and changes as noted below. Patient is chest pain free but troponin was checked and is markedly elevated. He is newly in atrial fibrillation. Gen: NAD, CV: iRRR, no murmurs, Lungs: clear, Abd: soft, Extrem: Warm, well perfused, no edema, Neuro/Psych: alert and oriented x 3, normal mood and affect. All available labs, radiology testing, previous records reviewed.   Agree as above. I would like to see his echo results before deciding on next steps. He is on therapy with brilinta, still on ASA, therapeutic lovenox, warfarin for DVT. This will serve as AC for afib. If new regionals present on echo, would consider discussion of repeat cath. If stable overall,  would observe and continue medical management.  Elouise Munroe, MD

## 2020-09-14 NOTE — Plan of Care (Signed)
  Problem: Skin Integrity: Goal: Risk for impaired skin integrity will decrease Outcome: Progressing   Problem: Respiratory: Goal: Will maintain a patent airway Outcome: Progressing   Problem: Activity: Goal: Ability to return to baseline activity level will improve Outcome: Progressing

## 2020-09-14 NOTE — Progress Notes (Signed)
Results for BURRIS, MATHERNE (MRN [id number]) as of 09/14/2020 11:49  Ref. Range 09/13/2020 12:20 09/13/2020 16:49 09/13/2020 20:44 09/14/2020 08:39 09/14/2020 11:19  Glucose-Capillary Latest Ref Range: 70 - 99 mg/dL 168 (H) 285 (H) 291 (H) 95 259 (H)  Noted that postprandial blood sugars have been greater than 200 mg/dl.  Recommend adding Novolog 3 units TID with meals if patient eats at least 50% of meal and blood sugars are elevated.   Harvel Ricks RN BSN CDE Diabetes Coordinator Pager: [phone]  8am-5pm

## 2020-09-14 NOTE — Plan of Care (Signed)
  Problem: Skin Integrity: Goal: Risk for impaired skin integrity will decrease Outcome: Progressing   Problem: Education: Goal: Knowledge of risk factors and measures for prevention of condition will improve Outcome: Progressing   Problem: Respiratory: Goal: Will maintain a patent airway Outcome: Progressing   Problem: Activity: Goal: Ability to return to baseline activity level will improve Outcome: Progressing   Problem: Cardiovascular: Goal: Ability to achieve and maintain adequate cardiovascular perfusion will improve Outcome: Progressing Goal: Vascular access site(s) Level 0-1 will be maintained Outcome: Progressing   Problem: Health Behavior/Discharge Planning: Goal: Ability to safely manage health-related needs after discharge will improve Outcome: Progressing

## 2020-09-14 NOTE — Progress Notes (Signed)
Occupational Therapy Treatment Patient Details Name: Eugene [name] MRN: [id number] DOB: [birth year] Today's Date: 09/14/2020    History of present illness Pt is a 65 y.o. male admitted 09/08/20 with 1-2 wk h/o retrosternal chest pain and BLE weakness, as well as unintentional weight loss. Workup for NSTEMI, transaminitis, UTI; incidental COVID-19. (+) RLE DVT on 2/12. Head CT negative for acute injury. PMH includes CAD (s/p CABG, PCI; vfib arrest s/p ICD), CKD IV, HTN, DM.   OT comments  Pt making steady progress towards OT goals this session.  Pt continues to present with decreased activity tolerance and impaired BLE strength impacting pts ability to complete BADLs independently. Pt required MIN A initially for sit<>stand from EOB but progressed to min guard with repetition. Pt completed stand pivot transfer from EOB>recliner with RW and min guard assist. Continued education provided on energy conservation strategies for home with pt verbalizing understanding. DC plan currently remains appropriate, will continue to follow acutely per POC.    Follow Up Recommendations  No OT follow up;Supervision - Intermittent    Equipment Recommendations  3 in 1 bedside commode;Other (comment) (RW)    Recommendations for Other Services      Precautions / Restrictions Precautions Precautions: Fall;Other (comment) Precaution Comments: monitor HR Restrictions Weight Bearing Restrictions: No       Mobility Bed Mobility Overal bed mobility: Needs Assistance Bed Mobility: Supine to Sit     Supine to sit: Modified independent (Device/Increase time);HOB elevated     General bed mobility comments: ny physcial assist neded, use of bed features  Transfers Overall transfer level: Needs assistance Equipment used: Rolling walker (2 wheeled) Transfers: Sit to/from [facility] Sit to Stand: Min guard;Min assist Stand pivot transfers: Min guard       General transfer comment: pt  initally required MIN A to rise from EOB but progressed to minguard with repetition. min guard for stand pivot transfer from EOB>recliner with RW, assist for safety and line mgmt    Balance Overall balance assessment: Needs assistance Sitting-balance support: Feet supported;No upper extremity supported Sitting balance-Leahy Scale: Fair Sitting balance - Comments: Static sitting balance   Standing balance support: Bilateral upper extremity supported;During functional activity Standing balance-Leahy Scale: Poor Standing balance comment: reliant on BUE support during mobility                           ADL either performed or assessed with clinical judgement   ADL Overall ADL's : Needs assistance/impaired                         Toilet Transfer: Min Film/video editor Details (indicate cue type and reason): simulated via functional mobility with RW; stand pivot transfer from [employer] Transfer Details (indicate cue type and reason): pt reports walkin shower with seat Functional mobility during ADLs: Min guard;Minimal assistance;Rolling walker General ADL Comments: pt continues to present with decreased activity tolerance and impaired BLE strength.     Vision       Perception     Praxis      Cognition Arousal/Alertness: Awake/alert Behavior During Therapy: WFL for tasks assessed/performed Overall Cognitive Status: Within Functional Limits for tasks assessed  Exercises Other Exercises Other Exercises: sit<>stand x10 from recliner with minguard to increase strength and endurance in BLEs Other Exercises: LAQ from recliner x10 reps Other Exercises: seated marches x10 from recliner   Shoulder Instructions       General Comments HR increase to 142 bpm with mobility, pt on RA with sats >90%. continued education on energy conservation strategies for home with pt  verbalizing understanding, pt reports fiance can assist pt as needed    Pertinent Vitals/ Pain       Pain Assessment: No/denies pain  Home Living                                          Prior Functioning/Environment              Frequency  Min 2X/week        Progress Toward Goals  OT Goals(current goals can now be found in the care plan section)  Progress towards OT goals: Progressing toward goals  Acute Rehab OT Goals Patient Stated Goal: to go home OT Goal Formulation: With patient Time For Goal Achievement: 09/26/20 Potential to Achieve Goals: Good  Plan Discharge plan remains appropriate;Frequency remains appropriate    Co-evaluation                 AM-PAC OT "6 Clicks" Daily Activity     Outcome Measure   Help from another person eating meals?: None Help from another person taking care of personal grooming?: A Little Help from another person toileting, which includes using toliet, bedpan, or urinal?: A Little Help from another person bathing (including washing, rinsing, drying)?: A Little Help from another person to put on and taking off regular upper body clothing?: None Help from another person to put on and taking off regular lower body clothing?: A Little 6 Click Score: 20    End of Session Equipment Utilized During Treatment: Rolling walker  OT Visit Diagnosis: Unsteadiness on feet (R26.81);Other abnormalities of gait and mobility (R26.89);Muscle weakness (generalized) (M62.81)   Activity Tolerance Patient tolerated treatment well   Patient Left in chair;with call bell/phone within reach;with chair alarm set   Nurse Communication Mobility status        Time: 8756-4332 OT Time Calculation (min): 16 min  Charges: OT General Charges $OT Visit: 1 Visit OT Treatments $Therapeutic Activity: 8-22 mins  Eugene [name]., COTA/L Acute Rehabilitation Services [phone]    Eugene [name] 09/14/2020,  10:03 AM

## 2020-09-14 NOTE — Progress Notes (Signed)
PROGRESS NOTE                                                                                                                                                                                                             Patient Demographics:    Eugene [name], is a 65 y.o. male, DOB - 1956/02/22, OBS:[id number]  Outpatient Primary MD for the patient is [name], MD   Admit date - 09/08/2020   LOS - 5  Chief Complaint  Patient presents with  . Weakness       Brief Narrative: Patient is a 65 y.o. male with PMHx of history of CAD s/p CABG-subsequent recent PCI in 06/2020-V. fib arrest-s/p ICD implantation, CKD stage IV, HTN, DM, HLD who presented to the hospital with 1-2-week history of retrosternal chest pain responsive to nitroglycerin tablets-and 1 week history of bilateral lower extremity weakness.  Per patient-approximately 6 months ago-he noted that he was having trouble walking up a flight of stairs-and had actually put a ramp to get to his house from his garage.  He also acknowledges having unintentional weight loss-around 40-50 pounds in the past 1 year  COVID-19 vaccinated status: Vaccinated-J&J vaccine-but not boosted.  Significant Events: 2/11>> Admit to [facility] for non-STEMI-and bilateral lower extremity weakness.  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  His bilateral leg weakness is improved somewhat.   Assessment  & Plan :   Non-STEMI in a patient with history of CAD s/p CABG in the past: he rarely had NSTEMI was seen by cardiology underwent left heart cath on 09/12/2020 showing multivessel CAD recommendation is aggressive medical management with aspirin, Brilinta and if possible statin, will again trend troponin and CK in order to see if CK is coming from skeletal muscle damage or from heart.  Currently on heparin drip> Lovenox/Couamdin  for DVT  as he takes Brilinta ( cannot to NOAC).  There is some divergence in troponin levels and CK levels on 09/14/2020, will trend.  Troponin has jumped considerably on 09/14/2020, cardiology is following.  Doubt there is anything much to be done different at this point.  Bilateral lower extremity weakness with ++ CK & +++ESR: His CK is elevated but this could be due to his NSTEMI, I think there is a good chance he might have polymyalgia rheumatica however elevated CK levels are muddying the picture, worthwhile to give him a steroid trial, no headache or vision problems, no jaw pain.  Also low B12 levels for which he is on replacement.  Outpatient surgery follow-up for muscle biopsy if needed.  Will definitely require outpatient rheumatology follow-up.  Possible pulmonary fibrosis noted on CT scan.  Likely chronic, pulmonary following, multiple serologies ordered so far double-stranded DNA, aldolase, low B12 are positive, ANA, anti-Jo 1, antiscleroderma, anti-Smith, Sjogren's syndrome, ANCA, rheumatoid factor, anti-CCP, ANA all negative, outpatient pulmonary follow-up with [name] post discharge.  Incidental Covid infection with evidence of interstitial lung disease on imaging.  Asymptomatic from COVID-19 standpoint, pulmonary following defer work-up of pulmonary fibrosis to pulmonary.    Acute lower extremity DVT.  Lovenox >>  Coumadin.  Transaminitis: Likely related to rhabdomyolysis-CK elevation.  No RUQ pain.  Complicated UTI: Growing Klebsiella total 5 days of IV antibiotics, stop date 09/14/2020.  Unintentional 40-50 pound weight loss in the past 1 year: Concern for undiagnosed malignancy-apparently was supposed to get colonoscopy-but due to recent PCI/dual antiplatelet agents this was placed on hold. Noncontrast CT abdomen/pelvis without any obvious malignancy.  Patient PCP follow-up and age-appropriate work-up.  AKI versus CKD stage IV: Creatinine 2 years ago was under 1, currently creatinine gently coming down with hydration which will be continued due to elevated CK levels, outpatient nephrology follow-up post discharge.  Hypokalemia: Repleted.  B12 deficiency.  Placed on SQ replacement.    DM-2 (A1c 7.7 on 07/06/2020): On Lantus to 35 units BID, continue SSI. Follow and adjust.   Recent Labs    09/13/20 1649 09/13/20 2044 09/14/20 0839  GLUCAP 285* 291* 95   COVID-19 infection: Incidental-asymptomatic-Remdesivir x3 days.  Lab Results  Component Value Date   SARSCOV2NAA POSITIVE (A) 09/09/2020   [place] NEGATIVE 07/05/2020      GI prophylaxis: PPI  Condition - Guarded  Family Communication  :  [name]-Significant [phone]-updated on 09/10/20, 09/13/20  Code Status :  Full Code  Diet :  Diet Order            Diet Carb Modified Fluid consistency: Thin; Room service appropriate? Yes  Diet effective now                  Disposition Plan  :   Status is: Inpatient  Remains inpatient appropriate because:Inpatient level of care appropriate due to severity of illness   Dispo: The patient is from: Home              Anticipated d/c is to: TBD              Anticipated d/c date is: > 3 days              Patient currently is not medically stable to d/c.   Difficult to place patient No  Barriers to discharge: Hypoxia requiring O2 supplementation/complete 5 days of IV  Remdesivir  Antimicorbials  :    Anti-infectives (From admission, onward)   Start  Dose/Rate Route Frequency Ordered Stop   09/09/20 1300  cefTRIAXone (ROCEPHIN) 2 g in sodium chloride 0.9 % 100 mL IVPB        2 g 200 mL/hr over 30 Minutes Intravenous Every 24 hours 09/09/20 1109 09/13/20 1439      Inpatient Medications  Scheduled Meds: . aspirin  81 mg Oral Daily  . atorvastatin  80 mg Oral Daily  . cyanocobalamin  1,000 mcg Intramuscular Daily  . feeding supplement  237 mL Oral TID BM  . insulin aspart  0-15 Units Subcutaneous TID WC  . insulin aspart  0-5 Units Subcutaneous QHS  . insulin glargine  35 Units Subcutaneous BID  . methylPREDNISolone (SOLU-MEDROL) injection  40 mg Intravenous Daily  . metoprolol succinate  37.5 mg Oral Daily  . multivitamin with minerals  1 tablet Oral Daily  . pantoprazole  40 mg Oral Q1200  . ticagrelor  90 mg Oral BID  . Warfarin - Pharmacist Dosing Inpatient   Does not apply q1600   Continuous Infusions: . sodium chloride    . heparin 1,650 Units/hr (09/13/20 2114)  . lactated ringers 100 mL/hr at 09/14/20 0730   PRN Meds:.sodium chloride, acetaminophen, nitroGLYCERIN, ondansetron (ZOFRAN) IV, sodium chloride flush   Time Spent in minutes  25   See all Orders from today for further details   Lala Lund M.D on 09/14/2020 at 11:21 AM  To page go to [email or web address] - use universal password  [facility] -  Office  [phone]    Objective:   Vitals:   09/13/20 2312 09/13/20 2345 09/14/20 0348 09/14/20 0924  BP: 99/69 104/62 100/62 91/64  Pulse: 93 89 77 98  Resp: _0 Temp: 97.7 F (36.5 C) 98.1 F (36.7 C) 97.9 F (36.6 C) 97.7 F (36.5 C)  TempSrc: Oral Oral Oral Oral  SpO2: 97% 95% 98% 98%  Weight:   76.4 kg   Height:        Wt Readings from Last 3 Encounters:  09/14/20 76.4 kg  08/04/20 80.2 kg  07/26/20 77.1 kg     Intake/Output Summary (Last 24 hours) at 09/14/2020 1121 Last data filed at  09/14/2020 0600 Gross per 24 hour  Intake 512.16 ml  Output 2400 ml  Net -1887.84 ml     Physical Exam  Awake Alert, No new F.N deficits, Normal affect [place].AT,PERRAL Supple Neck,No JVD, No cervical lymphadenopathy appriciated.  Symmetrical Chest wall movement, Good air movement bilaterally, CTAB RRR,No Gallops, Rubs or new Murmurs, No Parasternal Heave +ve B.Sounds, Abd Soft, No tenderness, No organomegaly appriciated, No rebound - guarding or rigidity. No Cyanosis, Clubbing or edema, No new Rash or bruise    Data Review:    CBC Recent Labs  Lab 09/08/20 1823 09/09/20 0409 09/10/20 0142 09/11/20 0439 09/12/20 0345 09/12/20 1642 09/13/20 0159 09/14/20 0300  WBC 13.2*   < > 12.1* 11.5* 11.4*  --  22.3* 17.1*  HGB 13.0   < > 11.5* 11.7*  12.1* 11.4* 12.6* 12.3* 10.3*  HCT 39.9   < > 33.4* 36.6* 35.2* 37.0* 36.5* 32.0*  PLT 177   < > 174 172 181  --  249 230  MCV 86.9   < > 84.8 88.8 88.2  --  86.7 89.1  MCH 28.3   < > 29.2 28.4 28.6  --  29.2 28.7  MCHC 32.6   < > 34.4 32.0 32.4  --  33.7 32.2  RDW 14.3   < > 14.6 14.8 14.7  --  15.4 15.7*  LYMPHSABS 2.3  --   --   --   --   --   --   --   MONOABS 1.2*  --   --   --   --   --   --   --   EOSABS 0.1  --   --   --   --   --   --   --   BASOSABS 0.0  --   --   --   --   --   --   --    < > = values in this interval not displayed.    Chemistries  Recent Labs  Lab 09/09/20 0409 09/09/20 1643 09/10/20 0721 09/11/20 0439 09/12/20 0345 09/12/20 1642 09/13/20 0159 09/14/20 0300  NA 136  --  140 139 139 113* 139 142  K 2.8*  --  3.3* 4.3 3.6 3.3* 4.1 3.6  CL 103  --  106 110 109  --  111 110  CO2 17*  --  23 20* 21*  --  14* 20*  GLUCOSE 89  --  60* 169* 141*  --  273* 129*  BUN 51*  --  42* 42* 38*  --  42* 46*  CREATININE 2.56*  --  2.15* 2.28* 2.22*  --  2.04* 2.04*  CALCIUM 9.0  --  8.9 8.9 9.0  --  9.3 9.1  MG  --    < > 2.6* 2.4 2.2  --  2.2 2.3  AST 182*  --   --  146* 150*  --  130* 124*  ALT 154*  --    --  150* 155*  --  161* 152*  ALKPHOS 46  --   --  54 57  --  60 54  BILITOT 1.3*  --   --  0.9 0.8  --  1.2 0.5   < > = values in this interval not displayed.   ------------------------------------------------------------------------------------------------------------------ No results for input(s): CHOL, HDL, LDLCALC, TRIG, CHOLHDL, LDLDIRECT in the last 72 hours.  Lab Results  Component Value Date   HGBA1C 9.0 (H) 09/12/2020   ------------------------------------------------------------------------------------------------------------------ No results for input(s): TSH, T4TOTAL, T3FREE, THYROIDAB in the last 72 hours.  Invalid input(s): FREET3 ------------------------------------------------------------------------------------------------------------------ No results for input(s): VITAMINB12, FOLATE, FERRITIN, TIBC, IRON, RETICCTPCT in the last 72 hours.  Coagulation profile Recent Labs  Lab 09/14/20 0300  INR 1.1    Recent Labs    09/12/20 0345 09/13/20 0159  DDIMER 0.84* 1.03*    Cardiac Enzymes Recent Labs  Lab 09/12/20 0200 09/13/20 0159 09/14/20 0300  CKMB 32.4* 28.6* 70.8*   ------------------------------------------------------------------------------------------------------------------    Component Value Date/Time   BNP 988.4 (H) 09/14/2020 0300    Micro Results Recent Results (from the past 240 hour(s))  Resp Panel by RT-PCR (Flu A&B, Covid) Nasopharyngeal Swab     Status: Abnormal   Collection Time: 09/09/20 12:32 AM   Specimen: Nasopharyngeal Swab; Nasopharyngeal(NP) swabs in vial transport medium  Result Value Ref Range Status   SARS Coronavirus 2 by RT PCR POSITIVE (A) NEGATIVE Final    Comment: RESULT CALLED TO, READ BACK BY AND VERIFIED WITH: Elige Radon RN 09/09/20 0224 JDW (NOTE) SARS-CoV-2 target nucleic acids are DETECTED.  The SARS-CoV-2 RNA is generally detectable in upper respiratory specimens during the acute phase of infection.  Positive results are indicative of the presence of the identified virus, but do not rule out bacterial infection or co-infection with other pathogens not detected by  the test. Clinical correlation with patient history and other diagnostic information is necessary to determine patient infection status. The expected result is Negative.  Fact Sheet for Patients: EntrepreneurPulse.com.au  Fact Sheet for Healthcare Providers: IncredibleEmployment.be  This test is not yet approved or cleared by the [place] FDA and  has been authorized for detection and/or diagnosis of SARS-CoV-2 by FDA under an Emergency Use Authorization (EUA).  This EUA will remain in effect (meaning this test can be [place] ed) for the duration of  the COVID-19 declaration under Section 564(b)(1) of the Act, 21 U.S.C. section 360bbb-3(b)(1), unless the authorization is terminated or revoked sooner.     Influenza A by PCR NEGATIVE NEGATIVE Final   Influenza B by PCR NEGATIVE NEGATIVE Final    Comment: (NOTE) The Xpert Xpress SARS-CoV-2/FLU/RSV plus assay is intended as an aid in the diagnosis of influenza from Nasopharyngeal swab specimens and should not be used as a sole basis for treatment. Nasal washings and aspirates are unacceptable for Xpert Xpress SARS-CoV-2/FLU/RSV testing.  Fact Sheet for Patients: EntrepreneurPulse.com.au  Fact Sheet for Healthcare Providers: IncredibleEmployment.be  This test is not yet approved or cleared by the [place] FDA and has been authorized for detection and/or diagnosis of SARS-CoV-2 by FDA under an Emergency Use Authorization (EUA). This EUA will remain in effect (meaning this test can be used) for the duration of the COVID-19 declaration under Section 564(b)(1) of the Act, 21 U.S.C. section 360bbb-3(b)(1), unless the authorization is terminated or revoked.  Performed at [facility],  [place] [address], [place], [place] [address]   Urine Culture     Status: Abnormal   Collection Time: 09/09/20 10:32 AM   Specimen: Urine, Random  Result Value Ref Range Status   Specimen Description URINE, RANDOM  Final   Special Requests   Final    Normal Performed at [facility], [place] [address], [place], [place] [address]    Culture >=100,000 COLONIES/mL KLEBSIELLA PNEUMONIAE (A)  Final   Report Status 09/11/2020 FINAL  Final   Organism ID, Bacteria KLEBSIELLA PNEUMONIAE (A)  Final      Susceptibility   Klebsiella pneumoniae - MIC*    AMPICILLIN >=32 RESISTANT Resistant     CEFAZOLIN <=4 SENSITIVE Sensitive     CEFEPIME <=0.12 SENSITIVE Sensitive     CEFTRIAXONE <=0.25 SENSITIVE Sensitive     CIPROFLOXACIN <=0.25 SENSITIVE Sensitive     GENTAMICIN <=1 SENSITIVE Sensitive     IMIPENEM <=0.25 SENSITIVE Sensitive     NITROFURANTOIN 64 INTERMEDIATE Intermediate     TRIMETH/SULFA <=20 SENSITIVE Sensitive     AMPICILLIN/SULBACTAM 4 SENSITIVE Sensitive     PIP/TAZO <=4 SENSITIVE Sensitive     * >=100,000 COLONIES/mL KLEBSIELLA PNEUMONIAE    Radiology Reports CT ABDOMEN PELVIS WO CONTRAST  Result Date: 09/09/2020 CLINICAL DATA:  Weight loss, and intended. EXAM: CT ABDOMEN AND PELVIS WITHOUT CONTRAST TECHNIQUE: Multidetector CT imaging of the abdomen and pelvis was performed following the standard protocol without IV contrast. COMPARISON:  None. FINDINGS: Lower chest: Coronary artery calcifications. Partially visualized cardiac defibrillator. Diffuse reticulations of the visualized lung bases as well as suggestion of emphysematous changes. Possible tiny hiatal hernia. Hepatobiliary: No focal liver abnormality. Several calcified gallstones within the gallbladder lumen. No gallbladder wall thickening or pericholecystic fluid. No biliary dilatation. Pancreas: No focal lesion. Normal pancreatic contour. No surrounding inflammatory changes. No main pancreatic ductal dilatation. Spleen: Normal  in size without focal abnormality. Suggestion of a splenule (3:33). Adrenals/Urinary Tract: No  adrenal nodule bilaterally. No nephrolithiasis, no hydronephrosis, and no contour-deforming renal mass. No ureterolithiasis or hydroureter. Urinary bladder wall emphysema. Stomach/Bowel: PO contrast reaches the mid small bowel. Stomach is within normal limits. No evidence of bowel wall thickening or dilatation. Appendix appears normal. Vascular/Lymphatic: No abdominal aorta or iliac aneurysm. Mild atherosclerotic plaque of the aorta and its branches. No abdominal, pelvic, or inguinal lymphadenopathy. Reproductive: Prostate is unremarkable. Other: No intraperitoneal free fluid. No intraperitoneal free gas. No organized fluid collection. Musculoskeletal: No abdominal wall hernia or abnormality No suspicious lytic or blastic osseous lesions. No acute displaced fracture. Multilevel degenerative changes of the spine. IMPRESSION: 1. Emphysematous cystitis. 2. Please note limited evaluation of the abdomen and pelvis due to noncontrast study. 3. Other imaging findings of potential clinical significance: Fibrosis and emphysematous changes of the lungs. Aortic Atherosclerosis (ICD10-I70.0). Electronically Signed   By: Iven Finn M.D.   On: 09/09/2020 03:09   CT HEAD WO CONTRAST  Result Date: 09/09/2020 CLINICAL DATA:  Neurological deficit.  Acute stroke suspected. EXAM: CT HEAD WITHOUT CONTRAST TECHNIQUE: Contiguous axial images were obtained from the base of the skull through the vertex without intravenous contrast. COMPARISON:  06/01/2014 FINDINGS: Brain: Mild to moderate generalized brain volume loss. Numerous areas of low-density in the hemispheric white matter and basal ganglia consistent with chronic small vessel disease. No sign of acute infarction, mass lesion, hemorrhage, hydrocephalus or extra-axial collection. Vascular: There is atherosclerotic calcification of the major vessels at the base of the brain. Skull:  Negative Sinuses/Orbits: Clear/normal Other: None IMPRESSION: No acute finding by CT. Atrophy and chronic small-vessel ischemic changes of the white matter and basal ganglia. Electronically Signed   By: Nelson Chimes M.D.   On: 09/09/2020 00:55   CT CHEST WO CONTRAST  Result Date: 09/09/2020 CLINICAL DATA:  COVID positive. Generalized weakness. Retrosternal chest pain. EXAM: CT CHEST WITHOUT CONTRAST TECHNIQUE: Multidetector CT imaging of the chest was performed following the standard protocol without IV contrast. COMPARISON:  Chest radiograph from one day prior. FINDINGS: Cardiovascular: Top-normal heart size. Single lead left subclavian ICD with lead tip in the right ventricular apex. No significant pericardial effusion/thickening. Three-vessel coronary atherosclerosis status post CABG. Atherosclerotic nonaneurysmal thoracic aorta. Normal caliber pulmonary arteries. Mediastinum/Nodes: No discrete thyroid nodules. Unremarkable esophagus. No pathologically enlarged axillary, mediastinal or hilar lymph nodes, noting limited sensitivity for the detection of hilar adenopathy on this noncontrast study. Lungs/Pleura: No pneumothorax. No pleural effusion. Mild centrilobular emphysema. Solid 5 mm right middle lobe pulmonary nodule along the minor fissure (series 5/image 75). No acute consolidative airspace disease, lung masses or additional significant pulmonary nodules. Extensive patchy confluent peripheral reticulation and ground-glass opacity throughout both lungs with associated moderate traction bronchiectasis, architectural distortion and volume loss. There is a basilar predominance to these findings. No frank honeycombing. Upper abdomen: Cholelithiasis. Musculoskeletal: No aggressive appearing focal osseous lesions. Intact sternotomy wires. Marked thoracic spondylosis. IMPRESSION: 1. 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 Suggest  outpatient pulmonology consultation and follow-up high-resolution chest CT study in 6-12 months. Findings are categorized as probable UIP per consensus guidelines: Diagnosis of Idiopathic Pulmonary Fibrosis: An Official ATS/ERS/JRS/ALAT Clinical Practice Guideline. Export, Iss 5, [phone], Mar 29 2017. 2. Solid 5 mm right middle lobe pulmonary nodule along the minor fissure. Follow-up noncontrast chest CT recommended in 12 months in this high risk patient. This recommendation follows the consensus statement: Guidelines for Management of Incidental Pulmonary Nodules Detected on CT  Images: From the Fleischner Society 2017; Radiology 2017; [phone]. 3. Cholelithiasis. 4. Aortic Atherosclerosis (ICD10-I70.0) and Emphysema (ICD10-J43.9). Electronically Signed   By: Ilona Sorrel M.D.   On: 09/09/2020 12:08   CARDIAC CATHETERIZATION  Result Date: 09/12/2020  Prox LAD lesion is 99% stenosed.  Ost LAD to Prox LAD lesion is 100% stenosed.  Mid LAD lesion is 80% stenosed.  1st Mrg lesion is 99% stenosed.  Ost Cx to Prox Cx lesion is 80% stenosed.  Mid Cx lesion is 100% stenosed.  Balloon angioplasty was performed.  Prox RCA to Dist RCA lesion is 100% stenosed with 100% stenosed side branch in RPAV.  Ost Ramus lesion is 100% stenosed.  SVG and is normal in caliber.  Previously placed Prox Graft drug eluting stent is widely patent.  LIMA and is large.  The graft exhibits no disease.  SVG and is normal in caliber.  Origin to Prox Graft lesion is 100% stenosed.  SVG and is large.  Prox Graft to Dist Graft lesion is 100% stenosed.  Dist Cx lesion is 95% stenosed.  Mid LM to Dist LM lesion is 80% stenosed.  Post intervention, there is a 20% residual stenosis.  Severe native coronary obstructive disease 80% distal left main stenosis, total occlusion of the LAD, ramus immediate vessel, high-grade circumflex stenoses, total occlusion of the proximal RCA.  There was a shunt to the  distal RCA via left to right and right to right collateralization. Patent LIMA graft supplying the mid LAD with diffuse disease in the LAD after insertion. Supplying the OM 2 vessel with widely patent stent in the proximal portion of the SVG.  There is 95% in-stent restenosis in the native OM 2 vessel beyond the anastomosis. Old occlusion of the vein graft which had supplied the ramus vessel. Old occlusion of the vein graft which had supplied the PDA. Difficult but ultimately successful intervention through the SVG to the distal OM 2 vessel requiring PTCA, [name] Cutting Balloon, and ultimate noncompliant balloon dilatation with a 95% stenosis being reduced to less than 20%. LVEDP: 11 mmHg RECOMMENDATION: DAPT probably indefinitely.  Since the patient developed early in-stent restenosis on aspirin/Plavix, Brilinta was utilized for more aggressive antiplatelet therapy.  Medical therapy for concomitant CAD.  Optimal blood pressure control, diabetes management, and aggressive lipid therapy with target LDL less than 70.   DG Chest Portable 1 View  Result Date: 09/08/2020 CLINICAL DATA:  Chest pain. EXAM: PORTABLE CHEST 1 VIEW COMPARISON:  September 12, 2010 FINDINGS: A multi lead AICD is noted. Multiple sternal wires are seen. The heart size and mediastinal contours are within normal limits. A radiopaque loop recorder device is seen. There is no evidence of acute infiltrate, pleural effusion or pneumothorax. Degenerative changes are seen throughout the thoracic spine. IMPRESSION: Stable exam without acute or active cardiopulmonary disease. Electronically Signed   By: Virgina Norfolk M.D.   On: 09/08/2020 18:50   VAS [place] LOWER EXTREMITY VENOUS (DVT) (ONLY [facility] & [facility])  Result Date: 09/09/2020  Lower Venous DVT Study Indications: Covid-19.  Limitations: Shadowing from arterial plaque, clothing. Comparison Study: No prior study Performing Technologist: Sharion Dove RVS  Examination Guidelines: A complete  evaluation includes B-mode imaging, spectral Doppler, color Doppler, and power Doppler as needed of all accessible portions of each vessel. Bilateral testing is considered an integral part of a complete examination. Limited examinations for reoccurring indications may be performed as noted. The reflux portion of the exam is performed with the patient in reverse Trendelenburg.  +---------+---------------+---------+-----------+----------+-------------------+  RIGHT    CompressibilityPhasicitySpontaneityPropertiesThrombus Aging      +---------+---------------+---------+-----------+----------+-------------------+ CFV                                                   Not well visualized +---------+---------------+---------+-----------+----------+-------------------+ SFJ                                                   Not well visualized +---------+---------------+---------+-----------+----------+-------------------+ FV Prox  Full           Yes      Yes                                      +---------+---------------+---------+-----------+----------+-------------------+ FV Mid   Full                                                             +---------+---------------+---------+-----------+----------+-------------------+ FV DistalFull                                                             +---------+---------------+---------+-----------+----------+-------------------+ PFV                                                   Not well visualized +---------+---------------+---------+-----------+----------+-------------------+ POP      Partial        Yes      Yes                  Acute               +---------+---------------+---------+-----------+----------+-------------------+ PTV      Full                                                             +---------+---------------+---------+-----------+----------+-------------------+ PERO     None                                          Acute               +---------+---------------+---------+-----------+----------+-------------------+ Gastroc  None                                         Acute               +---------+---------------+---------+-----------+----------+-------------------+   +---------+---------------+---------+-----------+----------+-------------------+  LEFT     CompressibilityPhasicitySpontaneityPropertiesThrombus Aging      +---------+---------------+---------+-----------+----------+-------------------+ CFV                                                   Not well visualized +---------+---------------+---------+-----------+----------+-------------------+ SFJ                                                   Not well visualized +---------+---------------+---------+-----------+----------+-------------------+ FV Prox  Full           Yes      Yes                                      +---------+---------------+---------+-----------+----------+-------------------+ FV Mid   Full                                                             +---------+---------------+---------+-----------+----------+-------------------+ FV DistalFull           Yes      Yes                                      +---------+---------------+---------+-----------+----------+-------------------+ PFV                                                   Not well visualized +---------+---------------+---------+-----------+----------+-------------------+ POP      Full           Yes      Yes                                      +---------+---------------+---------+-----------+----------+-------------------+ PTV      Full                                                             +---------+---------------+---------+-----------+----------+-------------------+ PERO     Full                                                              +---------+---------------+---------+-----------+----------+-------------------+    Summary: RIGHT: - Findings consistent with acute deep vein thrombosis involving the right popliteal vein, right peroneal veins, and right gastrocnemius veins. - There is no evidence of deep vein thrombosis in the lower extremity. - There is no  evidence of superficial venous thrombosis.  LEFT: - No evidence of common femoral vein obstruction.  *See table(s) above for measurements and observations. Electronically signed by Harold Barban MD on 09/09/2020 at 7:57:19 PM.    Final    ECHOCARDIOGRAM LIMITED  Result Date: 09/10/2020    ECHOCARDIOGRAM LIMITED REPORT   Patient Name:   BRETT DARKO [facility] Date of Exam: 09/09/2020 Medical Rec #:  [id number]    Height:       66.0 in Accession #:    [id number]   Weight:       163.0 lb Date of Birth:  31-Jul-1955   BSA:          1.833 m Patient Age:    53 years     BP:           99/66 mmHg Patient Gender: M            HR:           81 bpm. Exam Location:  Inpatient Procedure: Limited Echo, Limited Color Doppler and Cardiac Doppler Indications:    NSTEMI  History:        Patient has prior history of Echocardiogram examinations, most                 recent 08/16/2020. Defibrillator and Prior CABG, Covid. chronic                 kidney disease; Risk Factors:Diabetes.  Sonographer:    Johny Chess Referring Phys: [id number] [place]  1. Left ventricular ejection fraction, by estimation, is 50 to 55%. The left ventricle has low normal function. The left ventricle has no regional wall motion abnormalities. Left ventricular diastolic parameters are consistent with Grade I diastolic dysfunction (impaired relaxation).  2. Right ventricular systolic function is normal.  3. The mitral valve is normal in structure. No evidence of mitral valve regurgitation.  4. The aortic valve is tricuspid. There is mild calcification of the aortic valve. There is mild thickening of the aortic valve. Aortic  valve regurgitation is not visualized. No aortic stenosis is present. Comparison(s): A prior study was performed on 08/16/2020. No significant change from prior study. Prior images reviewed side by side. FINDINGS  Left Ventricle: Left ventricular ejection fraction, by estimation, is 50 to 55%. The left ventricle has low normal function. The left ventricle has no regional wall motion abnormalities. Abnormal (paradoxical) septal motion, consistent with RV pacemaker. Left ventricular diastolic parameters are consistent with Grade I diastolic dysfunction (impaired relaxation). Right Ventricle: Right ventricular systolic function is normal. Pericardium: Trivial pericardial effusion is present. Mitral Valve: The mitral valve is normal in structure. Tricuspid Valve: The tricuspid valve is grossly normal. Tricuspid valve regurgitation is not demonstrated. Aortic Valve: The aortic valve is tricuspid. There is mild calcification of the aortic valve. There is mild thickening of the aortic valve. Aortic valve regurgitation is not visualized. No aortic stenosis is present. Aorta: The aortic root and ascending aorta are structurally normal, with no evidence of dilitation. Additional Comments: A pacer wire is visualized. LEFT VENTRICLE PLAX 2D LVIDd:         4.60 cm     Diastology LVIDs:         3.70 cm     LV e' medial:    5.55 cm/s LV PW:         1.00 cm     LV E/e' medial:  11.9 LV IVS:        0.80  cm     LV e' lateral:   6.53 cm/s                            LV E/e' lateral: 10.1  LV Volumes (MOD) LV vol d, MOD A2C: 67.7 ml LV vol s, MOD A2C: 40.4 ml LV SV MOD A2C:     27.3 ml IVC IVC diam: 1.50 cm LEFT ATRIUM         Index LA diam:    3.80 cm 2.07 cm/m  AORTIC VALVE LVOT Vmax:   70.30 cm/s LVOT Vmean:  45.000 cm/s LVOT VTI:    0.126 m  AORTA Ao Asc diam: 2.90 cm MITRAL VALVE MV Area (PHT): 4.63 cm     SHUNTS MV Decel Time: 164 msec     Systemic VTI: 0.13 m MV E velocity: 66.00 cm/s MV A velocity: 105.00 cm/s MV E/A ratio:   0.63 Rudean Haskell MD Electronically signed by Rudean Haskell MD Signature Date/Time: 09/10/2020/12:10:15 PM    Final    ECHOCARDIOGRAM LIMITED  Result Date: 08/16/2020    ECHOCARDIOGRAM LIMITED REPORT   Patient Name:   AZLAAN ISIDORE [facility]  Date of Exam: 08/16/2020 Medical Rec #:  [id number]     Height:       66.0 in Accession #:    [id number]    Weight:       176.8 lb Date of Birth:  Nov 10, 1955    BSA:          1.898 m Patient Age:    26 years      BP:           111/67 mmHg Patient Gender: M             HR:           71 bpm. Exam Location:  [facility] Procedure: Limited Echo, Limited Color Doppler, Cardiac Doppler and Intracardiac            Opacification Agent Indications:    I50.9 CHF  History:        Patient has prior history of Echocardiogram examinations, most                 recent 07/06/2020. CHF, Previous Myocardial Infarction and CAD,                 Prior CABG, Stroke; Risk Factors:Hypertension, HLD, Diabetes and                 Dyslipidemia.  Sonographer:    Marygrace Drought RCS Referring Phys: [id number] [place] A CHANDRASEKHAR IMPRESSIONS  1. Left ventricular ejection fraction, by estimation, is 50 to 55%. The left ventricle has low normal function. The left ventricle has no regional wall motion abnormalities.  2. Right ventricular systolic function is normal. The right ventricular size is normal. There is normal pulmonary artery systolic pressure.  3. Left atrial size was mildly dilated.  4. The pericardial effusion is posterior and lateral to the left ventricle.  5. The mitral valve is normal in structure. No evidence of mitral valve regurgitation. No evidence of mitral stenosis.  6. The aortic valve is normal in structure. Aortic valve regurgitation is not visualized. No aortic stenosis is present.  7. The inferior vena cava is normal in size with greater than 50% respiratory variability, suggesting right atrial pressure of 3 mmHg. FINDINGS  Left Ventricle: Left ventricular ejection fraction, by  estimation, is 50 to 55%. The  left ventricle has low normal function. The left ventricle has no regional wall motion abnormalities. The left ventricular internal cavity size was normal in size. There is no left ventricular hypertrophy. Right Ventricle: The right ventricular size is normal. No increase in right ventricular wall thickness. Right ventricular systolic function is normal. There is normal pulmonary artery systolic pressure. The tricuspid regurgitant velocity is 2.13 m/s, and  with an assumed right atrial pressure of 3 mmHg, the estimated right ventricular systolic pressure is 56.3 mmHg. Left Atrium: Left atrial size was mildly dilated. Right Atrium: Right atrial size was normal in size. Pericardium: Trivial pericardial effusion is present. The pericardial effusion is posterior and lateral to the left ventricle. Mitral Valve: The mitral valve is normal in structure. There is mild thickening of the mitral valve leaflet(s). There is mild calcification of the mitral valve leaflet(s). Mild mitral annular calcification. No evidence of mitral valve stenosis. Tricuspid Valve: The tricuspid valve is normal in structure. Tricuspid valve regurgitation is not demonstrated. No evidence of tricuspid stenosis. Aortic Valve: The aortic valve is normal in structure. Aortic valve regurgitation is not visualized. No aortic stenosis is present. Pulmonic Valve: The pulmonic valve was normal in structure. Pulmonic valve regurgitation is not visualized. No evidence of pulmonic stenosis. Aorta: The aortic root is normal in size and structure. Venous: The inferior vena cava is normal in size with greater than 50% respiratory variability, suggesting right atrial pressure of 3 mmHg. IAS/Shunts: No atrial level shunt detected by color flow Doppler. Additional Comments: A pacer wire is visualized. LEFT VENTRICLE PLAX 2D LVIDd:         4.10 cm LVIDs:         2.90 cm LV PW:         1.40 cm LV IVS:        0.80 cm LVOT diam:     2.20 cm  LVOT Area:     3.80 cm  RIGHT VENTRICLE RVSP:           21.1 mmHg LEFT ATRIUM         Index      RIGHT ATRIUM LA diam:    3.90 cm 2.06 cm/m RA Pressure: 3.00 mmHg   AORTA Ao Root diam: 3.40 cm Ao Asc diam:  2.80 cm MITRAL VALVE                TRICUSPID VALVE MV Area (PHT):              TR Peak grad:   18.1 mmHg MV Decel Time:              TR Vmax:        213.00 cm/s MV E velocity: 85.70 cm/s   Estimated RAP:  3.00 mmHg MV A velocity: 109.00 cm/s  RVSP:           21.1 mmHg MV E/A ratio:  0.79                             SHUNTS                             Systemic Diam: 2.20 cm Jenkins Rouge MD Electronically signed by Jenkins Rouge MD Signature Date/Time: 08/16/2020/9:55:39 AM    Final

## 2020-09-15 ENCOUNTER — Other Ambulatory Visit: Payer: Self-pay | Admitting: Internal Medicine

## 2020-09-15 DIAGNOSIS — J849 Interstitial pulmonary disease, unspecified: Secondary | ICD-10-CM | POA: Diagnosis not present

## 2020-09-15 DIAGNOSIS — I214 Non-ST elevation (NSTEMI) myocardial infarction: Secondary | ICD-10-CM | POA: Diagnosis not present

## 2020-09-15 LAB — CBC
HCT: 32.8 % — ABNORMAL LOW (ref 39.0–52.0)
Hemoglobin: 10.4 g/dL — ABNORMAL LOW (ref 13.0–17.0)
MCH: 28.3 pg (ref 26.0–34.0)
MCHC: 31.7 g/dL (ref 30.0–36.0)
MCV: 89.4 fL (ref 80.0–100.0)
Platelets: 233 10*3/uL (ref 150–400)
RBC: 3.67 MIL/uL — ABNORMAL LOW (ref 4.22–5.81)
RDW: 15.9 % — ABNORMAL HIGH (ref 11.5–15.5)
WBC: 14.7 10*3/uL — ABNORMAL HIGH (ref 4.0–10.5)
nRBC: 0 % (ref 0.0–0.2)

## 2020-09-15 LAB — GLUCOSE, CAPILLARY
Glucose-Capillary: 59 mg/dL — ABNORMAL LOW (ref 70–99)
Glucose-Capillary: 96 mg/dL (ref 70–99)
Glucose-Capillary: 193 mg/dL — ABNORMAL HIGH (ref 70–99)
Glucose-Capillary: 268 mg/dL — ABNORMAL HIGH (ref 70–99)
Glucose-Capillary: 213 mg/dL — ABNORMAL HIGH (ref 70–99)

## 2020-09-15 LAB — COMPREHENSIVE METABOLIC PANEL
ALT: 172 U/L — ABNORMAL HIGH (ref 0–44)
AST: 111 U/L — ABNORMAL HIGH (ref 15–41)
Albumin: 2.5 g/dL — ABNORMAL LOW (ref 3.5–5.0)
Alkaline Phosphatase: 57 U/L (ref 38–126)
Anion gap: 11 (ref 5–15)
BUN: 44 mg/dL — ABNORMAL HIGH (ref 8–23)
CO2: 22 mmol/L (ref 22–32)
Calcium: 8.9 mg/dL (ref 8.9–10.3)
Chloride: 105 mmol/L (ref 98–111)
Creatinine, Ser: 1.97 mg/dL — ABNORMAL HIGH (ref 0.61–1.24)
GFR, Estimated: 37 mL/min — ABNORMAL LOW (ref >60)
Glucose, Bld: 121 mg/dL — ABNORMAL HIGH (ref 70–99)
Potassium: 3.8 mmol/L (ref 3.5–5.1)
Sodium: 138 mmol/L (ref 135–145)
Total Bilirubin: 0.5 mg/dL (ref 0.3–1.2)
Total Protein: 5.7 g/dL — ABNORMAL LOW (ref 6.5–8.1)

## 2020-09-15 LAB — CK TOTAL AND CKMB (NOT AT ARMC)
CK, MB: 22.4 ng/mL — ABNORMAL HIGH (ref 0.5–5.0)
Relative Index: 2 (ref 0.0–2.5)
Total CK: 1128 U/L — ABNORMAL HIGH (ref 49–397)

## 2020-09-15 LAB — TROPONIN I (HIGH SENSITIVITY): Troponin I (High Sensitivity): 9605 ng/L (ref <18)

## 2020-09-15 LAB — PROTIME-INR
INR: 1.2 (ref 0.8–1.2)
Prothrombin Time: 15 seconds (ref 11.4–15.2)

## 2020-09-15 LAB — BRAIN NATRIURETIC PEPTIDE: B Natriuretic Peptide: 1259.4 pg/mL — ABNORMAL HIGH (ref 0.0–100.0)

## 2020-09-15 LAB — THIOPURINE METHYLTRANSFERASE (TPMT), RBC: TPMT Activity:: 21.6 Units/mL RBC

## 2020-09-15 LAB — C-REACTIVE PROTEIN: CRP: 1.1 mg/dL — ABNORMAL HIGH (ref <1.0)

## 2020-09-15 MED ORDER — ENOXAPARIN SODIUM 80 MG/0.8ML ~~LOC~~ SOLN: 80.0000 mg | Freq: Every day | SUBCUTANEOUS | 10 refills | Status: DC

## 2020-09-15 MED ORDER — VITAMIN B-12 1000 MCG PO TABS: 1000.0000 ug | ORAL_TABLET | Freq: Every day | ORAL | 0 refills | Status: DC

## 2020-09-15 MED ORDER — GUAIFENESIN-DM 100-10 MG/5ML PO SYRP
10.0000 mL | ORAL_SOLUTION | Freq: Three times a day (TID) | ORAL | Status: DC | PRN
  Administered 2020-09-15: 10 mL via ORAL
  Filled 2020-09-15: qty 10

## 2020-09-15 MED ORDER — WARFARIN SODIUM 5 MG PO TABS
10.0000 mg | ORAL_TABLET | Freq: Once | ORAL | Status: AC
  Administered 2020-09-15: 10 mg via ORAL
  Filled 2020-09-15: qty 2

## 2020-09-15 MED ORDER — TICAGRELOR 90 MG PO TABS: 90.0000 mg | ORAL_TABLET | Freq: Two times a day (BID) | ORAL | 0 refills | Status: DC

## 2020-09-15 MED ORDER — DIGOXIN 0.25 MG/ML IJ SOLN
0.2500 mg | Freq: Once | INTRAMUSCULAR | Status: DC
  Filled 2020-09-15: qty 1

## 2020-09-15 MED ORDER — METHYLPREDNISOLONE SODIUM SUCC 40 MG IJ SOLR
40.0000 mg | Freq: Every day | INTRAMUSCULAR | Status: DC
  Administered 2020-09-15 – 2020-09-16 (×2): 40 mg via INTRAVENOUS
  Filled 2020-09-15 (×2): qty 1

## 2020-09-15 MED ORDER — ATORVASTATIN CALCIUM 80 MG PO TABS: 80.0000 mg | ORAL_TABLET | Freq: Every day | ORAL | 0 refills | Status: DC

## 2020-09-15 MED ORDER — INSULIN GLARGINE 100 UNIT/ML ~~LOC~~ SOLN
25.0000 [IU] | Freq: Two times a day (BID) | SUBCUTANEOUS | Status: DC
  Administered 2020-09-15 – 2020-09-16 (×3): 25 [IU] via SUBCUTANEOUS
  Filled 2020-09-15 (×4): qty 0.25

## 2020-09-15 MED FILL — ENOXAPARIN SODIUM 80 MG/0.8: 80 | 10 days supply | Qty: 8 | Fill #0

## 2020-09-15 MED FILL — BRILINTA 90 MG TABLET: 90 | 30 days supply | Qty: 60 | Fill #0

## 2020-09-15 MED FILL — ATORVASTATIN CALCIUM 80 MG: 80 | 30 days supply | Qty: 30 | Fill #0

## 2020-09-15 MED FILL — VITAMIN B-12 1000 MCG TABS: 1000 | 30 days supply | Qty: 30 | Fill #0

## 2020-09-15 NOTE — Care Management Important Message (Signed)
Important Message  Patient Details  Name: Eugene [name] MRN: [id number] Date of Birth: 09-11-1955   Medicare Important Message Given:  Yes  Pt. On Airborne and Contact precautions. Left for the RN to give to pt.     Holli Humbles Smith 09/15/2020, 11:40 AM

## 2020-09-15 NOTE — Progress Notes (Signed)
Physical Therapy Treatment Patient Details Name: Eugene [name] MRN: [id number] DOB: 1955/10/09 Today's Date: 09/15/2020    History of Present Illness Pt is a 65 y.o. male admitted 09/08/20 with 1-2 wk h/o retrosternal chest pain and BLE weakness, as well as unintentional weight loss. Workup for NSTEMI, transaminitis, UTI; incidental COVID-19. (+) RLE DVT on 2/12. Head CT negative for acute injury. PMH includes CAD (s/p CABG, PCI; vfib arrest s/p ICD), CKD IV, HTN, DM.    PT Comments    Pt in bed and agreeable to participate with therapy. He is disappointed he will not be going home today. All vitals WNL at start of session. Pt ambulated laps in room with RW. HR increased from 85 to ~130s with gait. Pt reporting SOB and instructed to sit and rest. Once seated, telemetry monitor indicated a run of Vtach for ~15 seconds before resolving. RN notified and session terminated. Will continue to follow acutely.    Follow Up Recommendations  Home health PT;Supervision for mobility/OOB     Equipment Recommendations  Rolling walker with 5" wheels;3in1 (PT) (TBD-pt may buy his own walker)    Recommendations for Other Services       Precautions / Restrictions Precautions Precautions: Fall;Other (comment) Precaution Comments: monitor HR Restrictions Weight Bearing Restrictions: No    Mobility  Bed Mobility Overal bed mobility: Modified Independent             General bed mobility comments: No assist required, however pt does report difficulty lifting LEs back into bed.    Transfers Overall transfer level: Needs assistance Equipment used: Rolling walker (2 wheeled) Transfers: Sit to/from Stand Sit to Stand: Min assist         General transfer comment: min A with cues for hand placement and technique.  Ambulation/Gait Ambulation/Gait assistance: Min guard Gait Distance (Feet): 100 Feet Assistive device: Rolling walker (2 wheeled) Gait Pattern/deviations: Step-through  pattern;Decreased stride length;Trunk flexed Gait velocity: Decreased Gait velocity interpretation: <1.31 ft/sec, indicative of household ambulator General Gait Details: Slow, mostly steady gait with RW and min guard for balance; HR increased to 130 with ambulation and pt with SOB. Once seated and resting monitor reported ~15 second run of V-Tach. Additional gait terminated and RN called.   Stairs             Wheelchair Mobility    Modified Rankin (Stroke Patients Only)       Balance Overall balance assessment: Needs assistance Sitting-balance support: Feet supported;No upper extremity supported Sitting balance-Leahy Scale: Fair Sitting balance - Comments: Static sitting balance   Standing balance support: Bilateral upper extremity supported;During functional activity Standing balance-Leahy Scale: Poor Standing balance comment: reliant on BUE support during mobility                            Cognition Arousal/Alertness: Awake/alert Behavior During Therapy: WFL for tasks assessed/performed Overall Cognitive Status: Within Functional Limits for tasks assessed                                        Exercises      General Comments        Pertinent Vitals/Pain Pain Assessment: No/denies pain    Home Living                      Prior Function  PT Goals (current goals can now be found in the care plan section) Acute Rehab PT Goals Patient Stated Goal: to go home PT Goal Formulation: With patient Time For Goal Achievement: 09/24/20 Potential to Achieve Goals: Good Progress towards PT goals: Progressing toward goals    Frequency    Min 3X/week      PT Plan Current plan remains appropriate    Co-evaluation              AM-PAC PT "6 Clicks" Mobility   Outcome Measure  Help needed turning from your back to your side while in a flat bed without using bedrails?: A Little Help needed moving from lying  on your back to sitting on the side of a flat bed without using bedrails?: A Little Help needed moving to and from a bed to a chair (including a wheelchair)?: A Little Help needed standing up from a chair using your arms (e.g., wheelchair or bedside chair)?: A Lot Help needed to walk in hospital room?: A Little Help needed climbing 3-5 steps with a railing? : A Lot 6 Click Score: 16    End of Session Equipment Utilized During Treatment: Gait belt Activity Tolerance: Treatment limited secondary to medical complications (Comment) (Run of Vtach) Patient left: with call bell/phone within reach;in bed;with nursing/sitter in room Nurse Communication: Mobility status;Other (comment) Tanna Furry) PT Visit Diagnosis: Unsteadiness on feet (R26.81);Other abnormalities of gait and mobility (R26.89);Muscle weakness (generalized) (M62.81)     Time: 7482-7078 PT Time Calculation (min) (ACUTE ONLY): 31 min  Charges:  $Gait Training: 23-37 mins                     Benjiman Core, [place] Pager [phone] Acute Rehab   Allena Katz 09/15/2020, 2:08 PM

## 2020-09-15 NOTE — Progress Notes (Signed)
ANTICOAGULATION CONSULT NOTE - Follow Up Consult  Pharmacy Consult for Heparin, warfarin  Indication: chest pain/ACS and DVT  No Known Allergies  Patient Measurements: Height: 5\' 6"  (167.6 cm) Weight: 76.4 kg (168 lb 6.9 oz) IBW/kg (Calculated) : 63.8 Heparin Dosing Weight: 73.9 kg  Vital Signs: Temp: 97.7 F (36.5 C) (02/18 0807) Temp Source: Oral (02/18 0807) BP: 115/76 (02/18 0952) Pulse Rate: 101 (02/18 0952)  Labs: Recent Labs    09/13/20 0159 09/13/20 1950 09/14/20 0300 09/14/20 0551 09/14/20 1018 09/15/20 0204 09/15/20 0559  HGB 12.3*  --  10.3*  --   --  10.4*  --   HCT 36.5*  --  32.0*  --   --  32.8*  --   PLT 249  --  230  --   --  233  --   LABPROT  --   --  13.4  --   --  15.0  --   INR  --   --  1.1  --   --  1.2  --   HEPARINUNFRC <0.10* 0.27* 0.43  --  0.55  --   --   CREATININE 2.04*  --  2.04*  --   --  1.97*  --   CKTOTAL 3,319*  --  2,423*  --   --  1,128*  --   CKMB 28.6*  --  70.8*  --   --  22.4*  --   TROPONINIHS 2,291*  --   --  50,932*  --   --  9,605*    Estimated Creatinine Clearance: 33.7 mL/min (A) (by C-G formula based on SCr of 1.97 mg/dL (H)).  Assessment: 65 yr old male on heparin post cath for RLE DVT.  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  Warfarin started 2/16. Plans ar to change to lovenox (cost for 5 days is about $95 and he is aware and able to pay the cost) -INR= 1.2 -hg= 10.4 -SCr= 1.97, CrCL ~ 30   Goal of Therapy:  Heparin level 0.3-0.7 units/ml Monitor platelets by anticoagulation protocol: Yes   Plan:  -Warfarin 10mg  today -lovenox 80mg  sq q24h -Daily INR   Hildred Laser, PharmD Clinical Pharmacist **Pharmacist phone directory can now be found on [email or web address] ([email or web address]).  Listed under [place].

## 2020-09-15 NOTE — Progress Notes (Signed)
Progress Note  Patient Name: Eugene [name] Date of Encounter: 09/15/2020  Primary Cardiologist: Cristopher Peru, MD   Subjective   No chest pain or palpitations.  Inpatient Medications    Scheduled Meds: . aspirin  81 mg Oral Daily  . atorvastatin  80 mg Oral Daily  . cyanocobalamin  1,000 mcg Intramuscular Daily  . enoxaparin (LOVENOX) injection  80 mg Subcutaneous Daily  . feeding supplement  237 mL Oral TID BM  . insulin aspart  0-15 Units Subcutaneous TID WC  . insulin aspart  0-5 Units Subcutaneous QHS  . insulin glargine  25 Units Subcutaneous BID  . methylPREDNISolone (SOLU-MEDROL) injection  40 mg Intravenous Daily  . metoprolol succinate  37.5 mg Oral Daily  . multivitamin with minerals  1 tablet Oral Daily  . pantoprazole  40 mg Oral Q1200  . ticagrelor  90 mg Oral BID  . warfarin  10 mg Oral ONCE-1600  . Warfarin - Pharmacist Dosing Inpatient   Does not apply q1600   Continuous Infusions: . sodium chloride     PRN Meds: sodium chloride, acetaminophen, nitroGLYCERIN, ondansetron (ZOFRAN) IV, sodium chloride flush   Vital Signs    Vitals:   09/15/20 0807 09/15/20 0900 09/15/20 0952 09/15/20 1222  BP: 94/78  115/76 98/64  Pulse: (!) 44 72 (!) 101 84  Resp: 18   20  Temp: 97.7 F (36.5 C)   97.7 F (36.5 C)  TempSrc: Oral   Oral  SpO2: 98%  97% 96%  Weight:      Height:        Intake/Output Summary (Last 24 hours) at 09/15/2020 1612 Last data filed at 09/15/2020 1300 Gross per 24 hour  Intake 960 ml  Output 2150 ml  Net -1190 ml   Filed Weights   09/12/20 0448 09/13/20 0413 09/14/20 0348  Weight: 74 kg 79.3 kg 76.4 kg    Telemetry    Currently SR, prior in day afib rates 130-140 - Personally Reviewed  ECG    No new - Personally Reviewed  Physical Exam   GEN: No acute distress.   Neck: No JVD Cardiac: regular rhythm, normal rate, no murmurs, rubs, or gallops.  Respiratory: Clear to auscultation bilaterally. GI: Soft, nontender,  non-distended  MS: No edema; No deformity. Neuro:  Nonfocal  Psych: Normal affect   Labs    Chemistry Recent Labs  Lab 09/13/20 0159 09/14/20 0300 09/15/20 0204  NA 139 142 138  K 4.1 3.6 3.8  CL 111 110 105  CO2 14* 20* 22  GLUCOSE 273* 129* 121*  BUN 42* 46* 44*  CREATININE 2.04* 2.04* 1.97*  CALCIUM 9.3 9.1 8.9  PROT 6.6 5.7* 5.7*  ALBUMIN 2.6* 2.4* 2.5*  AST 130* 124* 111*  ALT 161* 152* 172*  ALKPHOS 60 54 57  BILITOT 1.2 0.5 0.5  GFRNONAA 36* 36* 37*  ANIONGAP 14 12 11      Hematology Recent Labs  Lab 09/13/20 0159 09/14/20 0300 09/15/20 0204  WBC 22.3* 17.1* 14.7*  RBC 4.21* 3.59* 3.67*  HGB 12.3* 10.3* 10.4*  HCT 36.5* 32.0* 32.8*  MCV 86.7 89.1 89.4  MCH 29.2 28.7 28.3  MCHC 33.7 32.2 31.7  RDW 15.4 15.7* 15.9*  PLT 249 230 233    Cardiac EnzymesNo results for input(s): TROPONINI in the last 168 hours. No results for input(s): TROPIPOC in the last 168 hours.   BNP Recent Labs  Lab 09/13/20 0159 09/14/20 0300 09/15/20 0204  BNP 569.4* 988.4* 1,259.4*  DDimer  Recent Labs  Lab 09/11/20 0439 09/12/20 0345 09/13/20 0159  DDIMER 0.75* 0.84* 1.03*     Radiology    ECHOCARDIOGRAM LIMITED  Result Date: 09/14/2020    ECHOCARDIOGRAM LIMITED REPORT   Patient Name:   Eugene [name] [facility] Date of Exam: 09/14/2020 Medical Rec #:  [id number]    Height:       66.0 in Accession #:    [id number]   Weight:       168.4 lb Date of Birth:  [birth year]   BSA:          1.859 m Patient Age:    20 years     BP:           91/64 mmHg Patient Gender: M            HR:           99 bpm. Exam Location:  Inpatient Procedure: Limited Echo, Limited Color Doppler and Cardiac Doppler Indications:    atrial fibrillation  History:        Patient has prior history of Echocardiogram examinations, most                 recent 09/09/2020. CAD, Defibrillator, chronic kidney disease.                 Interstitial lung disease.; Risk Factors:Diabetes.  Sonographer:    Johny Chess  Referring Phys: [id number] [place]  1. Left ventricular ejection fraction, by estimation, is 45 to 50%. The left ventricle has mildly decreased function. The left ventricle demonstrates regional wall motion abnormalities (see scoring diagram/findings for description).  2. Right ventricular systolic function is normal. The right ventricular size is normal.  3. The mitral valve is normal in structure. Trivial mitral valve regurgitation. No evidence of mitral stenosis.  4. The aortic valve is grossly normal. There is mild calcification of the aortic valve. There is mild thickening of the aortic valve. Aortic valve regurgitation is not visualized. No aortic stenosis is present. Comparison(s): Prior images reviewed side by side. Conclusion(s)/Recommendation(s): Llimited study for EF. Patient is now in atrial fibrillation. The EF is mildly reduced, with slight focal wall motion abnormalities as noted. Prior images viewed side by side. The pattern is very similar to prior, but better seen with definity contrast on current study. Findings discussed with Dr. Margaretann Loveless. FINDINGS  Left Ventricle: Left ventricular ejection fraction, by estimation, is 45 to 50%. The left ventricle has mildly decreased function. The left ventricle demonstrates regional wall motion abnormalities. Definity contrast agent was given IV to delineate the left ventricular endocardial borders.  LV Wall Scoring: The anterior septum, inferior wall, basal inferoseptal segment, and apex are hypokinetic. The entire anterior wall, entire lateral wall, mid inferoseptal segment, apical septal segment, and apical inferior segment are normal. Right Ventricle: The right ventricular size is normal. Right ventricular systolic function is normal. Mitral Valve: The mitral valve is normal in structure. Trivial mitral valve regurgitation. No evidence of mitral valve stenosis. Tricuspid Valve: The tricuspid valve is normal in structure. Tricuspid valve  regurgitation is trivial. No evidence of tricuspid stenosis. Aortic Valve: The aortic valve is grossly normal. There is mild calcification of the aortic valve. There is mild thickening of the aortic valve. Aortic valve regurgitation is not visualized. No aortic stenosis is present. Pulmonic Valve: The pulmonic valve was not well visualized. Pulmonic valve regurgitation is trivial. No evidence of pulmonic stenosis. Additional Comments: A pacer wire is visualized in the right  atrium and right ventricle. LEFT VENTRICLE PLAX 2D LVIDd:         4.40 cm LVIDs:         3.80 cm LV PW:         0.90 cm LV IVS:        0.80 cm  LEFT ATRIUM         Index LA diam:    4.00 cm 2.15 cm/m Buford Dresser MD Electronically signed by Buford Dresser MD Signature Date/Time: 09/14/2020/8:51:40 PM    Final     Cardiac Studies   IMPRESSIONS     1. Left ventricular ejection fraction, by estimation, is 45 to 50%. The  left ventricle has mildly decreased function. The left ventricle  demonstrates regional wall motion abnormalities (see scoring  diagram/findings for description).   2. Right ventricular systolic function is normal. The right ventricular  size is normal.   3. The mitral valve is normal in structure. Trivial mitral valve  regurgitation. No evidence of mitral stenosis.   4. The aortic valve is grossly normal. There is mild calcification of the  aortic valve. There is mild thickening of the aortic valve. Aortic valve  regurgitation is not visualized. No aortic stenosis is present.   Comparison(s): Prior images reviewed side by side.   Conclusion(s)/Recommendation(s): Llimited study for EF. Patient is now in  atrial fibrillation. The EF is mildly reduced, with slight focal wall  motion abnormalities as noted. Prior images viewed side by side. The  pattern is very similar to prior, but  better seen with definity contrast on current study.  Patient Profile    Assessment & Plan   Principal  Problem:   NSTEMI (non-ST elevated myocardial infarction) [facility]) Active Problems:   Single implantable cardioverter-defibrillator (ICD) in situ   Coronary artery disease involving coronary bypass graft of native heart with angina pectoris (HCC)   Type 2 diabetes mellitus with vascular disease (HCC)   Rhabdomyolysis   CKD (chronic kidney disease) stage 4, GFR 15-29 ml/min (HCC)   Unintended weight loss   Weakness of both legs   Elevated LFTs   Prolonged QT interval   Weak urine stream   Weakness   ILD (interstitial lung disease) ([place])   1. NSTEMI             - cath 09/12/2020 patent LIMA to mLAD, patent stent in SVG-OM2 that was previously placed in Dec 2021, 95% in-stent restenosis in the native OM2 beyong anastomosis, chronically occluded SVG to ramus and SVG-PDA. Native OM2 ISR was treated with balloon angioplasty.              - due to early in-stent restenosis on aspirin and plavix, plavix was switched to Brilinta with recommendation of indefinite DAPT             - switching to Brilinta + coumadin combination. Still on aspirin. Consider triple therapy for first month.             - significant jump in troponin noted yesterday morning, no significant changes on echo or symptoms. Continue to monitor but likely from demand ischemia and possible distal embolization after procedure.   2. Bilateral lower extremity weakness             - elevated CK and sed rate, outpatient surgery and rheumatology follow up to decide whether to proceed with muscle biopsy   3. RLE DVT: seen on lower venous doppler, on IV heparin             -  planning coumadin + Brilinta combination for DVT and stent   4. CAD s/p CABG             - cath 07/06/2020 DES to SVG-OM2, and DES to OM2 distal to SVG anastomosis             - Echo 08/16/2020 with EF 50-55%   5. HLD   6. DM II   7. Acute vs CKD stage IV   8. H/o Vfib s/p ICD   9. Klebsiella UTI: seen on urine culture, on abx by primary team   10:  interstitial lung disease: seen on CT of chest 2/12, followed up pulmonology   11: COVID 19   12. Afib with RVR: new onset 2/16, coumadin for DVT, increase metoprolol XL to 37.5 mg daily.     For questions or updates, please contact [place] Please consult [email or web address] for contact info under        Signed, Elouise Munroe, MD  09/15/2020, 4:12 PM

## 2020-09-15 NOTE — Progress Notes (Signed)
PROGRESS NOTE                                                                                                                                                                                                             Patient Demographics:    Eugene [name], is a 65 y.o. male, DOB - [birth year], JKK:[id number]  Outpatient Primary MD for the patient is [name], MD   Admit date - 09/08/2020   LOS - 6  Chief Complaint  Patient presents with  . Weakness       Brief Narrative: Patient is a 65 y.o. male with PMHx of history of CAD s/p CABG-subsequent recent PCI in 06/2020-V. fib arrest-s/p ICD implantation, CKD stage IV, HTN, DM, HLD who presented to the hospital with 1-2-week history of retrosternal chest pain responsive to nitroglycerin tablets-and 1 week history of bilateral lower extremity weakness.  Per patient-approximately 6 months ago-he noted that he was having trouble walking up a flight of stairs-and had actually put a ramp to get to his house from his garage.  He also acknowledges having unintentional weight loss-around 40-50 pounds in the past 1 year  COVID-19 vaccinated status: Vaccinated-J&J vaccine-but not boosted.  Significant Events: 2/11>> Admit to [facility] for non-STEMI-and bilateral lower extremity weakness.  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  His bilateral leg weakness is improved somewhat.   Assessment  & Plan :   Non-STEMI in a patient with history of CAD s/p CABG in the past: he rarely had NSTEMI was seen by cardiology underwent left heart cath on 09/12/2020 showing multivessel CAD recommendation is aggressive medical management with aspirin, Brilinta and if possible statin, will again trend troponin and CK in order to see if CK is coming from skeletal muscle damage or from heart.  Currently on Lovenox/Couamdin for  DVT  as he takes Brilinta ( cannot do NOAC).  There is some divergence in troponin levels and CK levels on 09/14/2020, will trend.  Troponin has jumped considerably on 09/14/2020, cardiology is following.  Doubt there is anything much to be done different at this point.  Paroxysmal A. fib RVR [place] vas 2 score of greater than 3.  Case discussed with cardiologist, currently on beta-blocker, heart rate around 110 on 09/15/2020, limited options due to incidental finding of pulmonary fibrosis and hypertension.  Anticoagulation is for DVT, once heart rate slows down discharge home likely 09/16/2020.  He also has longstanding history of V. fib and has AICD.  Bilateral lower extremity weakness with ++ CK & +++ESR: His CK is elevated but this could be due to his NSTEMI, I think there is a good chance he might have polymyalgia rheumatica however elevated CK levels are muddying the picture, after starting him on IV steroids his CK levels have been trending down and are not correlating with troponin levels anymore especially on 2/16, 2/17 and 09/15/2020, will be discharged on oral steroid taper along with outpatient surgery follow-up for muscle biopsy and outpatient rheumatology follow-up.  Possible pulmonary fibrosis noted on CT scan.  Likely chronic, pulmonary following, multiple serologies ordered so far double-stranded DNA, aldolase, low B12 are  positive, ANA, anti-Jo 1, antiscleroderma, anti-Smith, Sjogren's syndrome, ANCA, rheumatoid factor, anti-CCP, ANA all negative, outpatient pulmonary follow-up with [name] post discharge.  Incidental Covid infection with evidence of interstitial lung disease on imaging.  Asymptomatic from COVID-19 standpoint, pulmonary following defer work-up of pulmonary fibrosis to pulmonary.    Acute lower extremity DVT.  Lovenox >> Coumadin.  Transaminitis: Likely related to rhabdomyolysis-CK elevation.  No RUQ pain.  Complicated UTI: Growing Klebsiella total 5 days of IV antibiotics, stop date 09/14/2020.  Unintentional 40-50 pound weight loss in the past 1 year: Concern for undiagnosed malignancy-apparently was supposed to get colonoscopy-but due to recent PCI/dual antiplatelet agents this was placed on hold. Noncontrast CT abdomen/pelvis without any obvious malignancy.  Patient PCP follow-up and age-appropriate work-up.  AKI versus CKD stage IV: Creatinine 2 years ago was under 1, currently creatinine gently coming down with hydration which will be continued due to elevated CK levels, outpatient nephrology follow-up post discharge.  Hypokalemia: Repleted.  B12 deficiency.  Placed on SQ replacement >> PO upon DC.  DM-2 (A1c 7.7 on 07/06/2020): On Lantus to 35 units BID, continue SSI. Follow and adjust.   Recent Labs    09/14/20 2026 09/15/20 0759 09/15/20 0857  GLUCAP 289* 59* 96   COVID-19 infection: Incidental-asymptomatic-Remdesivir x3 days.  Lab Results  Component Value Date   SARSCOV2NAA POSITIVE (A) 09/09/2020   [place] NEGATIVE 07/05/2020      GI prophylaxis: PPI  Condition - Guarded  Family Communication  :  [name]-Significant [phone]-updated on 09/10/20, 09/13/20, 09/15/20  Code Status :  Full Code  Diet :  Diet Order            Diet Carb Modified Fluid consistency: Thin; Room service appropriate? Yes  Diet effective now                  Disposition  Plan  :   Status is: Inpatient  Remains inpatient appropriate because:Inpatient level of care appropriate due to severity of illness   Dispo: The patient is from: Home              Anticipated d/c is to: TBD  Anticipated d/c date is: > 3 days              Patient currently is not medically stable to d/c.   Difficult to place patient No  Barriers to discharge: Hypoxia requiring O2 supplementation/complete 5 days of IV Remdesivir  Antimicorbials  :    Anti-infectives (From admission, onward)   Start     Dose/Rate Route Frequency Ordered Stop   09/09/20 1300  cefTRIAXone (ROCEPHIN) 2 g in sodium chloride 0.9 % 100 mL IVPB        2 g 200 mL/hr over 30 Minutes Intravenous Every 24 hours 09/09/20 1109 09/13/20 1439      Inpatient Medications  Scheduled Meds: . aspirin  81 mg Oral Daily  . atorvastatin  80 mg Oral Daily  . cyanocobalamin  1,000 mcg Intramuscular Daily  . enoxaparin (LOVENOX) injection  80 mg Subcutaneous Daily  . feeding supplement  237 mL Oral TID BM  . insulin aspart  0-15 Units Subcutaneous TID WC  . insulin aspart  0-5 Units Subcutaneous QHS  . insulin glargine  25 Units Subcutaneous BID  . methylPREDNISolone (SOLU-MEDROL) injection  40 mg Intravenous Daily  . metoprolol succinate  37.5 mg Oral Daily  . multivitamin with minerals  1 tablet Oral Daily  . pantoprazole  40 mg Oral Q1200  . ticagrelor  90 mg Oral BID  . Warfarin - Pharmacist Dosing Inpatient   Does not apply q1600   Continuous Infusions: . sodium chloride     PRN Meds:.sodium chloride, acetaminophen, nitroGLYCERIN, ondansetron (ZOFRAN) IV, sodium chloride flush   Time Spent in minutes  25   See all Orders from today for further details   Lala Lund M.D on 09/15/2020 at 11:27 AM  To page go to [email or web address] - use universal password  [facility] -  Office  [phone]    Objective:   Vitals:   09/15/20 0012 09/15/20 0807 09/15/20 0900 09/15/20 0952  BP:  114/71 94/78  115/76  Pulse: (!) 58 (!) 44 72 (!) 101  Resp:  18    Temp: 97.9 F (36.6 C) 97.7 F (36.5 C)    TempSrc: Oral Oral    SpO2: (!) 79% (!) 68%  97%  Weight:      Height:        Wt Readings from Last 3 Encounters:  09/14/20 76.4 kg  08/04/20 80.2 kg  07/26/20 77.1 kg     Intake/Output Summary (Last 24 hours) at 09/15/2020 1127 Last data filed at 09/15/2020 0724 Gross per 24 hour  Intake 360 ml  Output 1650 ml  Net -1290 ml     Physical Exam  Awake Alert, No new F.N deficits, Normal affect [place].AT,PERRAL Supple Neck,No JVD, No cervical lymphadenopathy appriciated.  Symmetrical Chest wall movement, Good air movement bilaterally, CTAB RRR,No Gallops, Rubs or new Murmurs, No Parasternal Heave +ve B.Sounds, Abd Soft, No tenderness, No organomegaly appriciated, No rebound - guarding or rigidity. No Cyanosis, Clubbing or edema, No new Rash or bruise    Data Review:    CBC Recent Labs  Lab 09/08/20 1823 09/09/20 0409 09/11/20 0439 09/12/20 0345 09/12/20 1642 09/13/20 0159 09/14/20 0300 09/15/20 0204  WBC 13.2*   < > 11.5* 11.4*  --  22.3* 17.1* 14.7*  HGB 13.0   < > 11.7*  12.1* 11.4* 12.6* 12.3* 10.3* 10.4*  HCT 39.9   < > 36.6* 35.2* 37.0* 36.5* 32.0* 32.8*  PLT 177   < > 172 181  --  249 230 233  MCV 86.9   < > 88.8 88.2  --  86.7 89.1 89.4  MCH 28.3   < > 28.4 28.6  --  29.2 28.7 28.3  MCHC 32.6   < > 32.0 32.4  --  33.7 32.2 31.7  RDW 14.3   < > 14.8 14.7  --  15.4 15.7* 15.9*  LYMPHSABS 2.3  --   --   --   --   --   --   --   MONOABS 1.2*  --   --   --   --   --   --   --   EOSABS 0.1  --   --   --   --   --   --   --   BASOSABS 0.0  --   --   --   --   --   --   --    < > = values in this interval not displayed.    Chemistries  Recent Labs  Lab 09/10/20 0721 09/11/20 0439 09/12/20 0345 09/12/20 1642 09/13/20 0159 09/14/20 0300 09/15/20 0204  NA 140 139 139 113* 139 142 138  K 3.3* 4.3 3.6 3.3* 4.1 3.6 3.8  CL 106 110 109  --  111  110 105  CO2 23 20* 21*  --  14* 20* 22  GLUCOSE 60* 169* 141*  --  273* 129* 121*  BUN 42* 42* 38*  --  42* 46* 44*  CREATININE 2.15* 2.28* 2.22*  --  2.04* 2.04* 1.97*  CALCIUM 8.9 8.9 9.0  --  9.3 9.1 8.9  MG 2.6* 2.4 2.2  --  2.2 2.3  --   AST  --  146* 150*  --  130* 124* 111*  ALT  --  150* 155*  --  161* 152* 172*  ALKPHOS  --  54 57  --  60 54 57  BILITOT  --  0.9 0.8  --  1.2 0.5 0.5   ------------------------------------------------------------------------------------------------------------------ No results for input(s): CHOL, HDL, LDLCALC, TRIG, CHOLHDL, LDLDIRECT in the last 72 hours.  Lab Results  Component Value Date   HGBA1C 9.0 (H) 09/12/2020   ------------------------------------------------------------------------------------------------------------------ No results for input(s): TSH, T4TOTAL, T3FREE, THYROIDAB in the last 72 hours.  Invalid input(s): FREET3 ------------------------------------------------------------------------------------------------------------------ No results for input(s): VITAMINB12, FOLATE, FERRITIN, TIBC, IRON, RETICCTPCT in the last 72 hours.  Coagulation profile Recent Labs  Lab 09/14/20 0300 09/15/20 0204  INR 1.1 1.2    Recent Labs    09/13/20 0159  DDIMER 1.03*    Cardiac Enzymes Recent Labs  Lab 09/13/20 0159 09/14/20 0300 09/15/20 0204  CKMB 28.6* 70.8* 22.4*   ------------------------------------------------------------------------------------------------------------------    Component Value Date/Time   BNP 1,259.4 (H) 09/15/2020 0204    Micro Results Recent Results (from the past 240 hour(s))  Resp Panel by RT-PCR (Flu A&B, Covid) Nasopharyngeal Swab     Status: Abnormal   Collection Time: 09/09/20 12:32 AM   Specimen: Nasopharyngeal Swab; Nasopharyngeal(NP) swabs in vial transport medium  Result Value Ref Range Status   SARS Coronavirus 2 by RT PCR POSITIVE (A) NEGATIVE Final    Comment: RESULT CALLED  TO, READ BACK BY AND VERIFIED WITH: Elige Radon RN 09/09/20 0224 JDW (NOTE) SARS-CoV-2 target nucleic acids are DETECTED.  The SARS-CoV-2 RNA is generally detectable in upper respiratory specimens during the acute phase of infection. Positive results are indicative of the presence of the identified virus, but do not rule out bacterial infection  or co-infection with other pathogens not detected by the test. Clinical correlation with patient history and other diagnostic information is necessary to determine patient infection status. The expected result is Negative.  Fact Sheet for Patients: EntrepreneurPulse.com.au  Fact Sheet for Healthcare Providers: IncredibleEmployment.be  This test is not yet approved or cleared by the [place] FDA and  has been authorized for detection and/or diagnosis of SARS-CoV-2 by FDA under an Emergency Use Authorization (EUA).  This EUA will remain in effect (meaning this test can be [place] ed) for the duration of  the COVID-19 declaration under Section 564(b)(1) of the Act, 21 U.S.C. section 360bbb-3(b)(1), unless the authorization is terminated or revoked sooner.     Influenza A by PCR NEGATIVE NEGATIVE Final   Influenza B by PCR NEGATIVE NEGATIVE Final    Comment: (NOTE) The Xpert Xpress SARS-CoV-2/FLU/RSV plus assay is intended as an aid in the diagnosis of influenza from Nasopharyngeal swab specimens and should not be used as a sole basis for treatment. Nasal washings and aspirates are unacceptable for Xpert Xpress SARS-CoV-2/FLU/RSV testing.  Fact Sheet for Patients: EntrepreneurPulse.com.au  Fact Sheet for Healthcare Providers: IncredibleEmployment.be  This test is not yet approved or cleared by the [place] FDA and has been authorized for detection and/or diagnosis of SARS-CoV-2 by FDA under an Emergency Use Authorization (EUA). This EUA will remain in effect  (meaning this test can be used) for the duration of the COVID-19 declaration under Section 564(b)(1) of the Act, 21 U.S.C. section 360bbb-3(b)(1), unless the authorization is terminated or revoked.  Performed at [facility], [place] [address], [place], [place] [address]   Urine Culture     Status: Abnormal   Collection Time: 09/09/20 10:32 AM   Specimen: Urine, Random  Result Value Ref Range Status   Specimen Description URINE, RANDOM  Final   Special Requests   Final    Normal Performed at [facility], [place] [address] [place], [place] [address]    Culture >=100,000 COLONIES/mL KLEBSIELLA PNEUMONIAE (A)  Final   Report Status 09/11/2020 FINAL  Final   Organism ID, Bacteria KLEBSIELLA PNEUMONIAE (A)  Final      Susceptibility   Klebsiella pneumoniae - MIC*    AMPICILLIN >=32 RESISTANT Resistant     CEFAZOLIN <=4 SENSITIVE Sensitive     CEFEPIME <=0.12 SENSITIVE Sensitive     CEFTRIAXONE <=0.25 SENSITIVE Sensitive     CIPROFLOXACIN <=0.25 SENSITIVE Sensitive     GENTAMICIN <=1 SENSITIVE Sensitive     IMIPENEM <=0.25 SENSITIVE Sensitive     NITROFURANTOIN 64 INTERMEDIATE Intermediate     TRIMETH/SULFA <=20 SENSITIVE Sensitive     AMPICILLIN/SULBACTAM 4 SENSITIVE Sensitive     PIP/TAZO <=4 SENSITIVE Sensitive     * >=100,000 COLONIES/mL KLEBSIELLA PNEUMONIAE    Radiology Reports CT ABDOMEN PELVIS WO CONTRAST  Result Date: 09/09/2020 CLINICAL DATA:  Weight loss, and intended. EXAM: CT ABDOMEN AND PELVIS WITHOUT CONTRAST TECHNIQUE: Multidetector CT imaging of the abdomen and pelvis was performed following the standard protocol without IV contrast. COMPARISON:  None. FINDINGS: Lower chest: Coronary artery calcifications. Partially visualized cardiac defibrillator. Diffuse reticulations of the visualized lung bases as well as suggestion of emphysematous changes. Possible tiny hiatal hernia. Hepatobiliary: No focal liver abnormality. Several calcified gallstones within the  gallbladder lumen. No gallbladder wall thickening or pericholecystic fluid. No biliary dilatation. Pancreas: No focal lesion. Normal pancreatic contour. No surrounding inflammatory changes. No main pancreatic ductal dilatation. Spleen: Normal in size without focal abnormality.  Suggestion of a splenule (3:33). Adrenals/Urinary Tract: No adrenal nodule bilaterally. No nephrolithiasis, no hydronephrosis, and no contour-deforming renal mass. No ureterolithiasis or hydroureter. Urinary bladder wall emphysema. Stomach/Bowel: PO contrast reaches the mid small bowel. Stomach is within normal limits. No evidence of bowel wall thickening or dilatation. Appendix appears normal. Vascular/Lymphatic: No abdominal aorta or iliac aneurysm. Mild atherosclerotic plaque of the aorta and its branches. No abdominal, pelvic, or inguinal lymphadenopathy. Reproductive: Prostate is unremarkable. Other: No intraperitoneal free fluid. No intraperitoneal free gas. No organized fluid collection. Musculoskeletal: No abdominal wall hernia or abnormality No suspicious lytic or blastic osseous lesions. No acute displaced fracture. Multilevel degenerative changes of the spine. IMPRESSION: 1. Emphysematous cystitis. 2. Please note limited evaluation of the abdomen and pelvis due to noncontrast study. 3. Other imaging findings of potential clinical significance: Fibrosis and emphysematous changes of the lungs. Aortic Atherosclerosis (ICD10-I70.0). Electronically Signed   By: Iven Finn M.D.   On: 09/09/2020 03:09   CT HEAD WO CONTRAST  Result Date: 09/09/2020 CLINICAL DATA:  Neurological deficit.  Acute stroke suspected. EXAM: CT HEAD WITHOUT CONTRAST TECHNIQUE: Contiguous axial images were obtained from the base of the skull through the vertex without intravenous contrast. COMPARISON:  06/01/2014 FINDINGS: Brain: Mild to moderate generalized brain volume loss. Numerous areas of low-density in the hemispheric white matter and basal ganglia  consistent with chronic small vessel disease. No sign of acute infarction, mass lesion, hemorrhage, hydrocephalus or extra-axial collection. Vascular: There is atherosclerotic calcification of the major vessels at the base of the brain. Skull: Negative Sinuses/Orbits: Clear/normal Other: None IMPRESSION: No acute finding by CT. Atrophy and chronic small-vessel ischemic changes of the white matter and basal ganglia. Electronically Signed   By: Nelson Chimes M.D.   On: 09/09/2020 00:55   CT CHEST WO CONTRAST  Result Date: 09/09/2020 CLINICAL DATA:  COVID positive. Generalized weakness. Retrosternal chest pain. EXAM: CT CHEST WITHOUT CONTRAST TECHNIQUE: Multidetector CT imaging of the chest was performed following the standard protocol without IV contrast. COMPARISON:  Chest radiograph from one day prior. FINDINGS: Cardiovascular: Top-normal heart size. Single lead left subclavian ICD with lead tip in the right ventricular apex. No significant pericardial effusion/thickening. Three-vessel coronary atherosclerosis status post CABG. Atherosclerotic nonaneurysmal thoracic aorta. Normal caliber pulmonary arteries. Mediastinum/Nodes: No discrete thyroid nodules. Unremarkable esophagus. No pathologically enlarged axillary, mediastinal or hilar lymph nodes, noting limited sensitivity for the detection of hilar adenopathy on this noncontrast study. Lungs/Pleura: No pneumothorax. No pleural effusion. Mild centrilobular emphysema. Solid 5 mm right middle lobe pulmonary nodule along the minor fissure (series 5/image 75). No acute consolidative airspace disease, lung masses or additional significant pulmonary nodules. Extensive patchy confluent peripheral reticulation and ground-glass opacity throughout both lungs with associated moderate traction bronchiectasis, architectural distortion and volume loss. There is a basilar predominance to these findings. No frank honeycombing. Upper abdomen: Cholelithiasis. Musculoskeletal: No  aggressive appearing focal osseous lesions. Intact sternotomy wires. Marked thoracic spondylosis. IMPRESSION: 1. 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 Suggest outpatient pulmonology consultation and follow-up high-resolution chest CT study in 6-12 months. Findings are categorized as probable UIP per consensus guidelines: Diagnosis of Idiopathic Pulmonary Fibrosis: An Official ATS/ERS/JRS/ALAT Clinical Practice Guideline. [place], Iss 5, [phone], Mar 29 2017. 2. Solid 5 mm right middle lobe pulmonary nodule along the minor fissure. Follow-up noncontrast chest CT recommended in 12 months in this high risk patient. This recommendation follows the consensus statement: Guidelines for  Management of Incidental Pulmonary Nodules Detected on CT Images: From the Fleischner Society 2017; Radiology 2017; 284:228-243. 3. Cholelithiasis. 4. Aortic Atherosclerosis (ICD10-I70.0) and Emphysema (ICD10-J43.9). Electronically Signed   By: Ilona Sorrel M.D.   On: 09/09/2020 12:08   CARDIAC CATHETERIZATION  Result Date: 09/12/2020  Prox LAD lesion is 99% stenosed.  Ost LAD to Prox LAD lesion is 100% stenosed.  Mid LAD lesion is 80% stenosed.  1st Mrg lesion is 99% stenosed.  Ost Cx to Prox Cx lesion is 80% stenosed.  Mid Cx lesion is 100% stenosed.  Balloon angioplasty was performed.  Prox RCA to Dist RCA lesion is 100% stenosed with 100% stenosed side branch in RPAV.  Ost Ramus lesion is 100% stenosed.  SVG and is normal in caliber.  Previously placed Prox Graft drug eluting stent is widely patent.  LIMA and is large.  The graft exhibits no disease.  SVG and is normal in caliber.  Origin to Prox Graft lesion is 100% stenosed.  SVG and is large.  Prox Graft to Dist Graft lesion is 100% stenosed.  Dist Cx lesion is 95% stenosed.  Mid LM to Dist LM lesion is 80% stenosed.  Post intervention, there is a 20%  residual stenosis.  Severe native coronary obstructive disease 80% distal left main stenosis, total occlusion of the LAD, ramus immediate vessel, high-grade circumflex stenoses, total occlusion of the proximal RCA.  There was a shunt to the distal RCA via left to right and right to right collateralization. Patent LIMA graft supplying the mid LAD with diffuse disease in the LAD after insertion. Supplying the OM 2 vessel with widely patent stent in the proximal portion of the SVG.  There is 95% in-stent restenosis in the native OM 2 vessel beyond the anastomosis. Old occlusion of the vein graft which had supplied the ramus vessel. Old occlusion of the vein graft which had supplied the PDA. Difficult but ultimately successful intervention through the SVG to the distal OM 2 vessel requiring PTCA, [name] Cutting Balloon, and ultimate noncompliant balloon dilatation with a 95% stenosis being reduced to less than 20%. LVEDP: 11 mmHg RECOMMENDATION: DAPT probably indefinitely.  Since the patient developed early in-stent restenosis on aspirin/Plavix, Brilinta was utilized for more aggressive antiplatelet therapy.  Medical therapy for concomitant CAD.  Optimal blood pressure control, diabetes management, and aggressive lipid therapy with target LDL less than 70.   DG Chest Portable 1 View  Result Date: 09/08/2020 CLINICAL DATA:  Chest pain. EXAM: PORTABLE CHEST 1 VIEW COMPARISON:  September 12, 2010 FINDINGS: A multi lead AICD is noted. Multiple sternal wires are seen. The heart size and mediastinal contours are within normal limits. A radiopaque loop recorder device is seen. There is no evidence of acute infiltrate, pleural effusion or pneumothorax. Degenerative changes are seen throughout the thoracic spine. IMPRESSION: Stable exam without acute or active cardiopulmonary disease. Electronically Signed   By: Virgina Norfolk M.D.   On: 09/08/2020 18:50   VAS [place] LOWER EXTREMITY VENOUS (DVT) (ONLY [facility] & [facility])  Result  Date: 09/09/2020  Lower Venous DVT Study Indications: Covid-19.  Limitations: Shadowing from arterial plaque, clothing. Comparison Study: No prior study Performing Technologist: Sharion Dove RVS  Examination Guidelines: A complete evaluation includes B-mode imaging, spectral Doppler, color Doppler, and power Doppler as needed of all accessible portions of each vessel. Bilateral testing is considered an integral part of a complete examination. Limited examinations for reoccurring indications may be performed as noted. The reflux portion of the exam is  performed with the patient in reverse Trendelenburg.  +---------+---------------+---------+-----------+----------+-------------------+ RIGHT    CompressibilityPhasicitySpontaneityPropertiesThrombus Aging      +---------+---------------+---------+-----------+----------+-------------------+ CFV                                                   Not well visualized +---------+---------------+---------+-----------+----------+-------------------+ SFJ                                                   Not well visualized +---------+---------------+---------+-----------+----------+-------------------+ FV Prox  Full           Yes      Yes                                      +---------+---------------+---------+-----------+----------+-------------------+ FV Mid   Full                                                             +---------+---------------+---------+-----------+----------+-------------------+ FV DistalFull                                                             +---------+---------------+---------+-----------+----------+-------------------+ PFV                                                   Not well visualized +---------+---------------+---------+-----------+----------+-------------------+ POP      Partial        Yes      Yes                  Acute                +---------+---------------+---------+-----------+----------+-------------------+ PTV      Full                                                             +---------+---------------+---------+-----------+----------+-------------------+ PERO     None                                         Acute               +---------+---------------+---------+-----------+----------+-------------------+ Gastroc  None  Acute               +---------+---------------+---------+-----------+----------+-------------------+   +---------+---------------+---------+-----------+----------+-------------------+ LEFT     CompressibilityPhasicitySpontaneityPropertiesThrombus Aging      +---------+---------------+---------+-----------+----------+-------------------+ CFV                                                   Not well visualized +---------+---------------+---------+-----------+----------+-------------------+ SFJ                                                   Not well visualized +---------+---------------+---------+-----------+----------+-------------------+ FV Prox  Full           Yes      Yes                                      +---------+---------------+---------+-----------+----------+-------------------+ FV Mid   Full                                                             +---------+---------------+---------+-----------+----------+-------------------+ FV DistalFull           Yes      Yes                                      +---------+---------------+---------+-----------+----------+-------------------+ PFV                                                   Not well visualized +---------+---------------+---------+-----------+----------+-------------------+ POP      Full           Yes      Yes                                      +---------+---------------+---------+-----------+----------+-------------------+  PTV      Full                                                             +---------+---------------+---------+-----------+----------+-------------------+ PERO     Full                                                             +---------+---------------+---------+-----------+----------+-------------------+    Summary: RIGHT: - Findings consistent with acute deep vein thrombosis involving the right popliteal vein, right peroneal veins, and right  gastrocnemius veins. - There is no evidence of deep vein thrombosis in the lower extremity. - There is no evidence of superficial venous thrombosis.  LEFT: - No evidence of common femoral vein obstruction.  *See table(s) above for measurements and observations. Electronically signed by Harold Barban MD on 09/09/2020 at 7:57:19 PM.    Final    ECHOCARDIOGRAM LIMITED  Result Date: 09/14/2020    ECHOCARDIOGRAM LIMITED REPORT   Patient Name:   MOSTAFA YUAN [facility] Date of Exam: 09/14/2020 Medical Rec #:  [id number]    Height:       66.0 in Accession #:    [id number]   Weight:       168.4 lb Date of Birth:  06/03/1956   BSA:          1.859 m Patient Age:    45 years     BP:           91/64 mmHg Patient Gender: M            HR:           99 bpm. Exam Location:  Inpatient Procedure: Limited Echo, Limited Color Doppler and Cardiac Doppler Indications:    atrial fibrillation  History:        Patient has prior history of Echocardiogram examinations, most                 recent 09/09/2020. CAD, Defibrillator, chronic kidney disease.                 Interstitial lung disease.; Risk Factors:Diabetes.  Sonographer:    Johny Chess Referring Phys: [id number] [place]  1. Left ventricular ejection fraction, by estimation, is 45 to 50%. The left ventricle has mildly decreased function. The left ventricle demonstrates regional wall motion abnormalities (see scoring diagram/findings for description).  2. Right ventricular systolic function is normal. The right  ventricular size is normal.  3. The mitral valve is normal in structure. Trivial mitral valve regurgitation. No evidence of mitral stenosis.  4. The aortic valve is grossly normal. There is mild calcification of the aortic valve. There is mild thickening of the aortic valve. Aortic valve regurgitation is not visualized. No aortic stenosis is present. Comparison(s): Prior images reviewed side by side. Conclusion(s)/Recommendation(s): Llimited study for EF. Patient is now in atrial fibrillation. The EF is mildly reduced, with slight focal wall motion abnormalities as noted. Prior images viewed side by side. The pattern is very similar to prior, but better seen with definity contrast on current study. Findings discussed with Dr. Margaretann Loveless. FINDINGS  Left Ventricle: Left ventricular ejection fraction, by estimation, is 45 to 50%. The left ventricle has mildly decreased function. The left ventricle demonstrates regional wall motion abnormalities. Definity contrast agent was given IV to delineate the left ventricular endocardial borders.  LV Wall Scoring: The anterior septum, inferior wall, basal inferoseptal segment, and apex are hypokinetic. The entire anterior wall, entire lateral wall, mid inferoseptal segment, apical septal segment, and apical inferior segment are normal. Right Ventricle: The right ventricular size is normal. Right ventricular systolic function is normal. Mitral Valve: The mitral valve is normal in structure. Trivial mitral valve regurgitation. No evidence of mitral valve stenosis. Tricuspid Valve: The tricuspid valve is normal in structure. Tricuspid valve regurgitation is trivial. No evidence of tricuspid stenosis. Aortic Valve: The aortic valve is grossly normal. There is mild calcification of the aortic valve. There is mild thickening of the aortic valve. Aortic valve regurgitation is not visualized.  No aortic stenosis is present. Pulmonic Valve: The pulmonic valve was not well visualized. Pulmonic  valve regurgitation is trivial. No evidence of pulmonic stenosis. Additional Comments: A pacer wire is visualized in the right atrium and right ventricle. LEFT VENTRICLE PLAX 2D LVIDd:         4.40 cm LVIDs:         3.80 cm LV PW:         0.90 cm LV IVS:        0.80 cm  LEFT ATRIUM         Index LA diam:    4.00 cm 2.15 cm/m Buford Dresser MD Electronically signed by Buford Dresser MD Signature Date/Time: 09/14/2020/8:51:40 PM    Final    ECHOCARDIOGRAM LIMITED  Result Date: 09/10/2020    ECHOCARDIOGRAM LIMITED REPORT   Patient Name:   AUBURN HERT [facility] Date of Exam: 09/09/2020 Medical Rec #:  [id number]    Height:       66.0 in Accession #:    [id number]   Weight:       163.0 lb Date of Birth:  08-Jul-1956   BSA:          1.833 m Patient Age:    56 years     BP:           99/66 mmHg Patient Gender: M            HR:           81 bpm. Exam Location:  Inpatient Procedure: Limited Echo, Limited Color Doppler and Cardiac Doppler Indications:    NSTEMI  History:        Patient has prior history of Echocardiogram examinations, most                 recent 08/16/2020. Defibrillator and Prior CABG, Covid. chronic                 kidney disease; Risk Factors:Diabetes.  Sonographer:    Johny Chess Referring Phys: [id number] [place]  1. Left ventricular ejection fraction, by estimation, is 50 to 55%. The left ventricle has low normal function. The left ventricle has no regional wall motion abnormalities. Left ventricular diastolic parameters are consistent with Grade I diastolic dysfunction (impaired relaxation).  2. Right ventricular systolic function is normal.  3. The mitral valve is normal in structure. No evidence of mitral valve regurgitation.  4. The aortic valve is tricuspid. There is mild calcification of the aortic valve. There is mild thickening of the aortic valve. Aortic valve regurgitation is not visualized. No aortic stenosis is present. Comparison(s): A prior study was  performed on 08/16/2020. No significant change from prior study. Prior images reviewed side by side. FINDINGS  Left Ventricle: Left ventricular ejection fraction, by estimation, is 50 to 55%. The left ventricle has low normal function. The left ventricle has no regional wall motion abnormalities. Abnormal (paradoxical) septal motion, consistent with RV pacemaker. Left ventricular diastolic parameters are consistent with Grade I diastolic dysfunction (impaired relaxation). Right Ventricle: Right ventricular systolic function is normal. Pericardium: Trivial pericardial effusion is present. Mitral Valve: The mitral valve is normal in structure. Tricuspid Valve: The tricuspid valve is grossly normal. Tricuspid valve regurgitation is not demonstrated. Aortic Valve: The aortic valve is tricuspid. There is mild calcification of the aortic valve. There is mild thickening of the aortic valve. Aortic valve regurgitation is not visualized. No aortic stenosis is present. Aorta: The aortic root and ascending aorta are  structurally normal, with no evidence of dilitation. Additional Comments: A pacer wire is visualized. LEFT VENTRICLE PLAX 2D LVIDd:         4.60 cm     Diastology LVIDs:         3.70 cm     LV e' medial:    5.55 cm/s LV PW:         1.00 cm     LV E/e' medial:  11.9 LV IVS:        0.80 cm     LV e' lateral:   6.53 cm/s                            LV E/e' lateral: 10.1  LV Volumes (MOD) LV vol d, MOD A2C: 67.7 ml LV vol s, MOD A2C: 40.4 ml LV SV MOD A2C:     27.3 ml IVC IVC diam: 1.50 cm LEFT ATRIUM         Index LA diam:    3.80 cm 2.07 cm/m  AORTIC VALVE LVOT Vmax:   70.30 cm/s LVOT Vmean:  45.000 cm/s LVOT VTI:    0.126 m  AORTA Ao Asc diam: 2.90 cm MITRAL VALVE MV Area (PHT): 4.63 cm     SHUNTS MV Decel Time: 164 msec     Systemic VTI: 0.13 m MV E velocity: 66.00 cm/s MV A velocity: 105.00 cm/s MV E/A ratio:  0.63 Rudean Haskell MD Electronically signed by Rudean Haskell MD Signature Date/Time:  09/10/2020/12:10:15 PM    Final

## 2020-09-16 ENCOUNTER — Other Ambulatory Visit: Payer: Self-pay | Admitting: Internal Medicine

## 2020-09-16 DIAGNOSIS — I214 Non-ST elevation (NSTEMI) myocardial infarction: Secondary | ICD-10-CM | POA: Diagnosis not present

## 2020-09-16 LAB — CBC
HCT: 32.7 % — ABNORMAL LOW (ref 39.0–52.0)
Hemoglobin: 10.6 g/dL — ABNORMAL LOW (ref 13.0–17.0)
MCH: 28.6 pg (ref 26.0–34.0)
MCHC: 32.4 g/dL (ref 30.0–36.0)
MCV: 88.1 fL (ref 80.0–100.0)
Platelets: 267 10*3/uL (ref 150–400)
RBC: 3.71 MIL/uL — ABNORMAL LOW (ref 4.22–5.81)
RDW: 16 % — ABNORMAL HIGH (ref 11.5–15.5)
WBC: 17.5 10*3/uL — ABNORMAL HIGH (ref 4.0–10.5)
nRBC: 0 % (ref 0.0–0.2)

## 2020-09-16 LAB — COMPREHENSIVE METABOLIC PANEL
ALT: 155 U/L — ABNORMAL HIGH (ref 0–44)
AST: 70 U/L — ABNORMAL HIGH (ref 15–41)
Albumin: 2.5 g/dL — ABNORMAL LOW (ref 3.5–5.0)
Alkaline Phosphatase: 57 U/L (ref 38–126)
Anion gap: 9 (ref 5–15)
BUN: 44 mg/dL — ABNORMAL HIGH (ref 8–23)
CO2: 20 mmol/L — ABNORMAL LOW (ref 22–32)
Calcium: 9 mg/dL (ref 8.9–10.3)
Chloride: 112 mmol/L — ABNORMAL HIGH (ref 98–111)
Creatinine, Ser: 1.82 mg/dL — ABNORMAL HIGH (ref 0.61–1.24)
GFR, Estimated: 41 mL/min — ABNORMAL LOW (ref >60)
Glucose, Bld: 122 mg/dL — ABNORMAL HIGH (ref 70–99)
Potassium: 3.4 mmol/L — ABNORMAL LOW (ref 3.5–5.1)
Sodium: 141 mmol/L (ref 135–145)
Total Bilirubin: 0.8 mg/dL (ref 0.3–1.2)
Total Protein: 5.9 g/dL — ABNORMAL LOW (ref 6.5–8.1)

## 2020-09-16 LAB — TROPONIN I (HIGH SENSITIVITY): Troponin I (High Sensitivity): 4891 ng/L (ref <18)

## 2020-09-16 LAB — PROTIME-INR
INR: 1.5 — ABNORMAL HIGH (ref 0.8–1.2)
Prothrombin Time: 17.8 seconds — ABNORMAL HIGH (ref 11.4–15.2)

## 2020-09-16 LAB — C-REACTIVE PROTEIN: CRP: 0.8 mg/dL (ref <1.0)

## 2020-09-16 LAB — BRAIN NATRIURETIC PEPTIDE: B Natriuretic Peptide: 1504 pg/mL — ABNORMAL HIGH (ref 0.0–100.0)

## 2020-09-16 LAB — CK TOTAL AND CKMB (NOT AT ARMC)
CK, MB: 11.8 ng/mL — ABNORMAL HIGH (ref 0.5–5.0)
Relative Index: 1.3 (ref 0.0–2.5)
Total CK: 932 U/L — ABNORMAL HIGH (ref 49–397)

## 2020-09-16 LAB — INTRINSIC FACTOR ANTIBODIES: Intrinsic Factor: 16 AU/mL — ABNORMAL HIGH (ref 0.0–1.1)

## 2020-09-16 LAB — GLUCOSE, CAPILLARY: Glucose-Capillary: 116 mg/dL — ABNORMAL HIGH (ref 70–99)

## 2020-09-16 MED ORDER — PREDNISONE 5 MG PO TABS: ORAL_TABLET | ORAL | 0 refills | Status: DC

## 2020-09-16 MED ORDER — ENOXAPARIN SODIUM 80 MG/0.8ML ~~LOC~~ SOLN
80.0000 mg | Freq: Two times a day (BID) | SUBCUTANEOUS | Status: DC
  Administered 2020-09-16: 80 mg via SUBCUTANEOUS
  Filled 2020-09-16: qty 0.8

## 2020-09-16 MED ORDER — "INSULIN SYRINGE-NEEDLE U-100 25G X 1"" 1 ML MISC": 0 refills | Status: AC

## 2020-09-16 MED ORDER — WARFARIN SODIUM 5 MG PO TABS: 5.0000 mg | ORAL_TABLET | Freq: Every day | ORAL | 0 refills | Status: DC

## 2020-09-16 MED ORDER — POTASSIUM CHLORIDE CRYS ER 20 MEQ PO TBCR
40.0000 meq | EXTENDED_RELEASE_TABLET | Freq: Once | ORAL | Status: AC
  Administered 2020-09-16: 40 meq via ORAL
  Filled 2020-09-16: qty 2

## 2020-09-16 MED ORDER — WARFARIN SODIUM 5 MG PO TABS: 10.0000 mg | ORAL_TABLET | Freq: Once | ORAL | Status: DC

## 2020-09-16 MED ORDER — INSULIN ASPART 100 UNIT/ML ~~LOC~~ SOLN: SUBCUTANEOUS | 0 refills | Status: AC

## 2020-09-16 NOTE — Plan of Care (Signed)
  Problem: Skin Integrity: Goal: Risk for impaired skin integrity will decrease Outcome: Completed/Met   Problem: Education: Goal: Knowledge of risk factors and measures for prevention of condition will improve Outcome: Completed/Met   Problem: Coping: Goal: Psychosocial and spiritual needs will be supported Outcome: Completed/Met   Problem: Respiratory: Goal: Will maintain a patent airway Outcome: Completed/Met Goal: Complications related to the disease process, condition or treatment will be avoided or minimized Outcome: Completed/Met   Problem: Education: Goal: Understanding of CV disease, CV risk reduction, and recovery process will improve Outcome: Adequate for Discharge Goal: Individualized Educational Video(s) Outcome: Adequate for Discharge   Problem: Activity: Goal: Ability to return to baseline activity level will improve Outcome: Completed/Met   Problem: Cardiovascular: Goal: Ability to achieve and maintain adequate cardiovascular perfusion will improve Outcome: Completed/Met Goal: Vascular access site(s) Level 0-1 will be maintained Outcome: Completed/Met   Problem: Health Behavior/Discharge Planning: Goal: Ability to safely manage health-related needs after discharge will improve Outcome: Completed/Met

## 2020-09-16 NOTE — Progress Notes (Signed)
Discharge instructions including but not limited to site care, meds, daily wts, I&Os., f/u appmts,site care, HHRN,lovenox injections (w/ return demonstration of injection,) & etc. All questions answered & information verified via teachback method. All belongings gathered & sent with pt along with his paper rx. Per pt his other meds were sent home yesterday the ones he received from transition of care. Hoover Brunette, RN

## 2020-09-16 NOTE — Discharge Summary (Signed)
Eugene [name]:[id number] DOB: 09/07/1955 DOA: 09/08/2020  PCP: [name], MD  Admit date: 09/08/2020  Discharge date: 09/16/2020  Admitted From: Home   Disposition:  Home   Recommendations for Outpatient Follow-up:   Follow up with PCP in 1-2 weeks  PCP Please obtain BMP/CBC, 2 view CXR in 1week,  (see Discharge instructions)   PCP Please follow up on the following pending results:    Home Health: PT,OT,RN Equipment/Devices: see below  Consultations: Cards,Pulm, CCS, Neuro Discharge Condition: Stable    CODE STATUS: Full    Diet Recommendation: Heart Healthy Low Carb  Diet Order            Diet Carb Modified Fluid consistency: Thin; Room service appropriate? Yes  Diet effective now                  Chief Complaint  Patient presents with  . Weakness     Brief history of present illness from the day of admission and additional interim summary     Patient is a 65 y.o. male with PMHx of history of CAD s/p CABG-subsequent recent PCI in 06/2020-V. fib arrest-s/p ICD implantation, CKD stage IV, HTN, DM, HLD who presented to the hospital with 1-2-week history of retrosternal chest pain responsive to nitroglycerin tablets-and 1 week history of bilateral lower extremity weakness.  Per patient-approximately 6 months ago-he noted that he was having trouble walking up a flight of stairs-and had actually put a ramp to get to his house from his garage.  He also acknowledges having unintentional weight loss-around 40-50 pounds in the past 1 year  COVID-19 vaccinated status: Vaccinated-J&J vaccine-but not boosted.  Significant Events: 2/11>> Admit to [facility] for non-STEMI-and bilateral lower extremity weakness.  Significant studies: 2/11>> chest x-ray: No pneumonia 2/12>> CT abdomen/pelvis: Emphysematous  cystitis-no obvious malignancy 2/12>> CT head: No acute findings 2/12>> PSA: Within normal limits 2/12>> HIV: Nonreactive 2/12>> vitamin B12: Ordered 2/12>> TSH: Ordered 2/12>> ESR: Ordered  COVID-19 medications: Remdesivir: 2/12>>  Antibiotics: Rocephin: 2/12>>  Microbiology data: 2/12>> urine culture: Ordered  Procedures: None  Consults: Cardiology, neurology, general surgery, pulmonary                                                                 Hospital Course     Non-STEMI in a patient with history of CAD s/p CABG in the past: he rarely had NSTEMI was seen by cardiology underwent left heart cath on 09/12/2020 showing multivessel CAD recommendation is aggressive medical management with aspirin, Brilinta and if possible statin, will again trend troponin and CK in order to see if CK is coming from skeletal muscle damage or from heart.  Currently on Lovenox/Couamdin for DVT  as he takes Brilinta ( cannot do NOAC).  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  Paroxysmal A. fib RVR [place] vas 2 score of greater than 3.  Case discussed with cardiologist, currently on beta-blocker, heart rate around 110 on 09/15/2020, limited options due to incidental finding of pulmonary fibrosis and hypertension.  Anticoagulation is for DVT, once heart rate slows down discharge home likely 09/16/2020.  He also has longstanding history of V. fib and has AICD.  Bilateral lower extremity weakness with ++ CK & +++ESR: Extremely elevated CK levels with positive aldolase and double-stranded DNA, CK level started trending down once he was started on steroids and weakness is improved, will be discharged on oral steroid  taper along with outpatient surgery follow-up for muscle biopsy if needed and outpatient rheumatology follow-up.  Request PCP to kindly arrange for rheumatology follow-up and if needed general surgery follow-up.  Possible pulmonary fibrosis noted on CT scan.  Likely chronic, pulmonary following, multiple serologies ordered so far double-stranded DNA, aldolase, low B12 are positive, ANA, anti-Jo 1, antiscleroderma, anti-Smith, Sjogren's syndrome, ANCA, rheumatoid factor, anti-CCP, ANA all negative, outpatient pulmonary follow-up with [name] post discharge.  Incidental Covid infection with evidence of interstitial lung disease on imaging.  Asymptomatic from COVID-19 standpoint, pulmonary following defer work-up of pulmonary fibrosis to pulmonary.    Has finished 3 days of remdesivir treatment.  Acute lower extremity DVT.  Lovenox >> Coumadin.  INR 1.5 will follow with PCP on Monday to get Coumadin and Lovenox dose adjusted.  Transaminitis: Likely related to rhabdomyolysis-CK elevation.  No RUQ pain.  Complicated UTI: Growing Klebsiella has finished IV Rocephin treatment.  Unintentional 40-50 pound weight loss in the past 1 year: Concern for undiagnosed malignancy-apparently was supposed to get colonoscopy-but due to recent PCI/dual antiplatelet agents this was placed on hold. Noncontrast CT abdomen/pelvis without any obvious malignancy.  Patient PCP follow-up and age-appropriate work-up.  AKI versus CKD stage IV: Creatinine 2 years ago was under 1, currently creatinine gently coming down with hydration which will be continued due to elevated CK levels, outpatient nephrology follow-up post discharge.  Request PCP to arrange for outpatient renal follow-up.  Hypokalemia: Repleted.  B12 deficiency.  Placed on SQ replacement >> PO upon DC.  DM-2 (A1c 7.7 on 07/06/2020): Continue home regimen we will add sliding scale as he is taking prednisone taper.  Discharge diagnosis      Principal Problem:   NSTEMI (non-ST elevated myocardial infarction) [facility]) Active Problems:   Single implantable cardioverter-defibrillator (ICD) in situ   Coronary artery disease involving coronary bypass graft of native heart with angina pectoris (HCC)   Type 2 diabetes mellitus with vascular disease (HCC)   Rhabdomyolysis   CKD (chronic kidney disease) stage 4, GFR 15-29 ml/min (HCC)   Unintended weight loss   Weakness of both legs   Elevated LFTs   Prolonged QT interval   Weak urine stream   Weakness   ILD (interstitial lung disease) ([place])    Discharge instructions    Discharge Instructions    Amb Referral to Cardiac Rehabilitation   Complete by: As directed    Diagnosis: PTCA   After initial evaluation and assessments completed: Virtual Based Care may be provided alone or in conjunction with Phase 2 Cardiac Rehab based on patient barriers.: Yes   Discharge instructions   Complete by:  As directed    Follow with Primary MD [name], MD in 7 days   Get CBC, CMP, INR, 2 view Chest X ray -  checked on coming Monday by Primary MD, get Lovenox and Coumadin dose adjusted as needed.   Activity: As tolerated with Full fall precautions use walker/cane & assistance as needed  Disposition Home   Diet: Heart Healthy Low Carb.  Accuchecks 4 times/day, Once in AM empty stomach and then before each meal. Log in all results and show them to your Prim.MD in 3 days. If any glucose reading is under 80 or above 300 call your Prim MD immidiately. Follow Low glucose instructions for glucose under 80 as instructed.   Special Instructions: If you have smoked or chewed Tobacco  in the last 2 yrs please stop smoking, stop any regular Alcohol  and or any Recreational drug use.  On your next visit with your primary care physician please Get Medicines reviewed and adjusted.  Please request your Prim.MD to go over all Hospital Tests and Procedure/Radiological results at the follow up,  please get all Hospital records sent to your Prim MD by signing hospital release before you go home.  If you experience worsening of your admission symptoms, develop shortness of breath, life threatening emergency, suicidal or homicidal thoughts you must seek medical attention immediately by calling 911 or calling your MD immediately  if symptoms less severe.  You Must read complete instructions/literature along with all the possible adverse reactions/side effects for all the Medicines you take and that have been prescribed to you. Take any new Medicines after you have completely understood and accpet all the possible adverse reactions/side effects.   Increase activity slowly   Complete by: As directed       Discharge Medications   Allergies as of 09/16/2020   No Known Allergies     Medication List    STOP taking these medications   clopidogrel 75 MG tablet Commonly known as: PLAVIX   metFORMIN 500 MG tablet Commonly known as: GLUCOPHAGE   rosuvastatin 40 MG tablet Commonly known as: CRESTOR     TAKE these medications   aspirin 81 MG tablet Take 81 mg by mouth daily.   atorvastatin 80 MG tablet Commonly known as: LIPITOR Take 1 tablet (80 mg total) by mouth daily.   dapagliflozin propanediol 10 MG Tabs tablet Commonly known as: FARXIGA Take 10 mg by mouth at bedtime.   enoxaparin 80 MG/0.8ML injection Commonly known as: LOVENOX Inject 0.8 mLs (80 mg total) into the skin daily.   furosemide 40 MG tablet Commonly known as: LASIX Take 1 tablet (40 mg total) by mouth daily.   insulin aspart 100 UNIT/ML injection Commonly known as: NovoLOG Substitute to any brand approved.Before each meal 3 times a day, 140-199 - 2 units, 200-250 - 4 units, 251-299 - 6 units,  300-349 - 8 units,  350 or above 10 units. Dispense syringes and needles as needed, Ok to switch to PEN if approved. DX DM2, Code E11.65   insulin glargine 100 UNIT/ML Solostar Pen Commonly known as:  LANTUS Inject 70 Units into the skin daily before breakfast.   Insulin Syringe-Needle U-100 25G X 1" 1 ML Misc For 4 times a day insulin SQ, 1 month supply. Diagnosis E11.65   methocarbamol 500 MG tablet Commonly known as: ROBAXIN Take 500 mg by mouth 2 (two) times daily.   metoprolol succinate 50 MG 24 hr tablet Commonly known as: TOPROL-XL TAKE ONE TABLET BY MOUTH  ONE TIME DAILY. TAKE WITH OR IMMEDIATELY FOLLOWING A MEAL What changed: See the new instructions.   nitroGLYCERIN 0.4 MG SL tablet Commonly known as: NITROSTAT Place 0.4 mg under the tongue every 5 (five) minutes as needed for chest pain.   Ozempic (1 MG/DOSE) 4 MG/3ML Sopn Generic drug: Semaglutide (1 MG/DOSE) Inject 1 mg into the skin every Tuesday.   pantoprazole 40 MG tablet Commonly known as: PROTONIX Take 40 mg by mouth daily before breakfast.   predniSONE 5 MG tablet Commonly known as: DELTASONE 8 Pills PO for 3 days,6 Pills PO for 3 days,4 Pills PO for 3 days,2 Pills PO for 3 days,1 Pills PO for 3 days,1/2 Pill  PO for 3 days then STOP.   ranolazine 500 MG 12 hr tablet Commonly known as: RANEXA TAKE ONE TABLET BY MOUTH TWICE DAILY.PLEASE MAKE APPT WITH DR.HARDING FOR REFILLS. What changed: See the new instructions.   ticagrelor 90 MG Tabs tablet Commonly known as: BRILINTA Take 1 tablet (90 mg total) by mouth 2 (two) times daily.   vitamin B-12 1000 MCG tablet Commonly known as: CYANOCOBALAMIN Take 1 tablet (1,000 mcg total) by mouth daily.   warfarin 5 MG tablet Commonly known as: COUMADIN Take 1 tablet (5 mg total) by mouth daily at 4 PM.            Durable Medical Equipment  (From admission, onward)         Start     Ordered   09/14/20 1131  For home use only DME Walker rolling  Once       Comments: 5 wheel  Question Answer Comment  Walker: With [place] Wheels   Patient needs a walker to treat with the following condition Weakness      09/14/20 1130           Follow-up  Information    [facility], [facility] Follow up.   Specialty: Home Health Services Why: For home health services. They will call you in 1-2 days to set up home health services. Contact information: [address] [place] [address] [phone]        [name], MD. Schedule an appointment as soon as possible for a visit in 3 day(s).   Specialty: Endocrinology Why: get INR checked and Lovenox Coumadin dose adjusted Contact information: [place] [address] [phone]        [name], MD .   Specialty: Cardiology Contact information: [address] [address] [phone]        Lahoma Rocker, MD. Schedule an appointment as soon as possible for a visit in 1 week(s).   Specialty: Rheumatology Contact information: [address] [place] [address] [phone]        Brand Males, MD Follow up in 1 week(s).   Specialty: Pulmonary Disease Why: ILD Contact information: [place] [address] [phone]               Major procedures and Radiology Reports - PLEASE review detailed and final reports thoroughly  -       CT ABDOMEN PELVIS WO CONTRAST  Result Date: 09/09/2020 CLINICAL DATA:  Weight loss, and intended. EXAM: CT ABDOMEN AND PELVIS WITHOUT CONTRAST TECHNIQUE: Multidetector CT imaging of the abdomen and pelvis was performed following the standard protocol without IV contrast. COMPARISON:  None. FINDINGS: Lower chest: Coronary artery calcifications. Partially visualized cardiac defibrillator. Diffuse reticulations of  the visualized lung bases as well as suggestion of emphysematous changes. Possible tiny hiatal hernia. Hepatobiliary: No focal liver abnormality. Several calcified gallstones within the gallbladder lumen. No gallbladder wall thickening or pericholecystic fluid. No biliary dilatation. Pancreas: No focal lesion. Normal pancreatic  contour. No surrounding inflammatory changes. No main pancreatic ductal dilatation. Spleen: Normal in size without focal abnormality. Suggestion of a splenule (3:33). Adrenals/Urinary Tract: No adrenal nodule bilaterally. No nephrolithiasis, no hydronephrosis, and no contour-deforming renal mass. No ureterolithiasis or hydroureter. Urinary bladder wall emphysema. Stomach/Bowel: PO contrast reaches the mid small bowel. Stomach is within normal limits. No evidence of bowel wall thickening or dilatation. Appendix appears normal. Vascular/Lymphatic: No abdominal aorta or iliac aneurysm. Mild atherosclerotic plaque of the aorta and its branches. No abdominal, pelvic, or inguinal lymphadenopathy. Reproductive: Prostate is unremarkable. Other: No intraperitoneal free fluid. No intraperitoneal free gas. No organized fluid collection. Musculoskeletal: No abdominal wall hernia or abnormality No suspicious lytic or blastic osseous lesions. No acute displaced fracture. Multilevel degenerative changes of the spine. IMPRESSION: 1. Emphysematous cystitis. 2. Please note limited evaluation of the abdomen and pelvis due to noncontrast study. 3. Other imaging findings of potential clinical significance: Fibrosis and emphysematous changes of the lungs. Aortic Atherosclerosis (ICD10-I70.0). Electronically Signed   By: Iven Finn M.D.   On: 09/09/2020 03:09   CT HEAD WO CONTRAST  Result Date: 09/09/2020 CLINICAL DATA:  Neurological deficit.  Acute stroke suspected. EXAM: CT HEAD WITHOUT CONTRAST TECHNIQUE: Contiguous axial images were obtained from the base of the skull through the vertex without intravenous contrast. COMPARISON:  06/01/2014 FINDINGS: Brain: Mild to moderate generalized brain volume loss. Numerous areas of low-density in the hemispheric white matter and basal ganglia consistent with chronic small vessel disease. No sign of acute infarction, mass lesion, hemorrhage, hydrocephalus or extra-axial collection.  Vascular: There is atherosclerotic calcification of the major vessels at the base of the brain. Skull: Negative Sinuses/Orbits: Clear/normal Other: None IMPRESSION: No acute finding by CT. Atrophy and chronic small-vessel ischemic changes of the white matter and basal ganglia. Electronically Signed   By: Nelson Chimes M.D.   On: 09/09/2020 00:55   CT CHEST WO CONTRAST  Result Date: 09/09/2020 CLINICAL DATA:  COVID positive. Generalized weakness. Retrosternal chest pain. EXAM: CT CHEST WITHOUT CONTRAST TECHNIQUE: Multidetector CT imaging of the chest was performed following the standard protocol without IV contrast. COMPARISON:  Chest radiograph from one day prior. FINDINGS: Cardiovascular: Top-normal heart size. Single lead left subclavian ICD with lead tip in the right ventricular apex. No significant pericardial effusion/thickening. Three-vessel coronary atherosclerosis status post CABG. Atherosclerotic nonaneurysmal thoracic aorta. Normal caliber pulmonary arteries. Mediastinum/Nodes: No discrete thyroid nodules. Unremarkable esophagus. No pathologically enlarged axillary, mediastinal or hilar lymph nodes, noting limited sensitivity for the detection of hilar adenopathy on this noncontrast study. Lungs/Pleura: No pneumothorax. No pleural effusion. Mild centrilobular emphysema. Solid 5 mm right middle lobe pulmonary nodule along the minor fissure (series 5/image 75). No acute consolidative airspace disease, lung masses or additional significant pulmonary nodules. Extensive patchy confluent peripheral reticulation and ground-glass opacity throughout both lungs with associated moderate traction bronchiectasis, architectural distortion and volume loss. There is a basilar predominance to these findings. No frank honeycombing. Upper abdomen: Cholelithiasis. Musculoskeletal: No aggressive appearing focal osseous lesions. Intact sternotomy wires. Marked thoracic spondylosis. IMPRESSION: 1. Spectrum of findings  compatible with basilar predominant fibrotic interstitial lung disease without frank honeycombing. Suggest outpatient pulmonology consultation and follow-up high-resolution chest CT study in 6-12 months. Findings are categorized as probable UIP per  consensus guidelines: Diagnosis of Idiopathic Pulmonary Fibrosis: An Official ATS/ERS/JRS/ALAT Clinical Practice Guideline. [place], Iss 5, [phone], Mar 29 2017. 2. Solid 5 mm right middle lobe pulmonary nodule along the minor fissure. Follow-up noncontrast chest CT recommended in 12 months in this high risk patient. This recommendation follows the consensus statement: Guidelines for Management of Incidental Pulmonary Nodules Detected on CT Images: From the Fleischner Society 2017; Radiology 2017; 284:228-243. 3. Cholelithiasis. 4. Aortic Atherosclerosis (ICD10-I70.0) and Emphysema (ICD10-J43.9). Electronically Signed   By: Ilona Sorrel M.D.   On: 09/09/2020 12:08   CARDIAC CATHETERIZATION  Result Date: 09/12/2020  Prox LAD lesion is 99% stenosed.  Ost LAD to Prox LAD lesion is 100% stenosed.  Mid LAD lesion is 80% stenosed.  1st Mrg lesion is 99% stenosed.  Ost Cx to Prox Cx lesion is 80% stenosed.  Mid Cx lesion is 100% stenosed.  Balloon angioplasty was performed.  Prox RCA to Dist RCA lesion is 100% stenosed with 100% stenosed side branch in RPAV.  Ost Ramus lesion is 100% stenosed.  SVG and is normal in caliber.  Previously placed Prox Graft drug eluting stent is widely patent.  LIMA and is large.  The graft exhibits no disease.  SVG and is normal in caliber.  Origin to Prox Graft lesion is 100% stenosed.  SVG and is large.  Prox Graft to Dist Graft lesion is 100% stenosed.  Dist Cx lesion is 95% stenosed.  Mid LM to Dist LM lesion is 80% stenosed.  Post intervention, there is a 20% residual stenosis.  Severe native coronary obstructive disease 80% distal left main stenosis, total occlusion of the LAD, ramus  immediate vessel, high-grade circumflex stenoses, total occlusion of the proximal RCA.  There was a shunt to the distal RCA via left to right and right to right collateralization. Patent LIMA graft supplying the mid LAD with diffuse disease in the LAD after insertion. Supplying the OM 2 vessel with widely patent stent in the proximal portion of the SVG.  There is 95% in-stent restenosis in the native OM 2 vessel beyond the anastomosis. Old occlusion of the vein graft which had supplied the ramus vessel. Old occlusion of the vein graft which had supplied the PDA. Difficult but ultimately successful intervention through the SVG to the distal OM 2 vessel requiring PTCA, [name] Cutting Balloon, and ultimate noncompliant balloon dilatation with a 95% stenosis being reduced to less than 20%. LVEDP: 11 mmHg RECOMMENDATION: DAPT probably indefinitely.  Since the patient developed early in-stent restenosis on aspirin/Plavix, Brilinta was utilized for more aggressive antiplatelet therapy.  Medical therapy for concomitant CAD.  Optimal blood pressure control, diabetes management, and aggressive lipid therapy with target LDL less than 70.   DG Chest Portable 1 View  Result Date: 09/08/2020 CLINICAL DATA:  Chest pain. EXAM: PORTABLE CHEST 1 VIEW COMPARISON:  September 12, 2010 FINDINGS: A multi lead AICD is noted. Multiple sternal wires are seen. The heart size and mediastinal contours are within normal limits. A radiopaque loop recorder device is seen. There is no evidence of acute infiltrate, pleural effusion or pneumothorax. Degenerative changes are seen throughout the thoracic spine. IMPRESSION: Stable exam without acute or active cardiopulmonary disease. Electronically Signed   By: Virgina Norfolk M.D.   On: 09/08/2020 18:50   VAS [place] LOWER EXTREMITY VENOUS (DVT) (ONLY [facility] & [facility])  Result Date: 09/09/2020  Lower Venous DVT Study Indications: Covid-19.  Limitations: Shadowing from arterial plaque, clothing.  Comparison Study:  No prior study Performing Technologist: Sharion Dove RVS  Examination Guidelines: A complete evaluation includes B-mode imaging, spectral Doppler, color Doppler, and power Doppler as needed of all accessible portions of each vessel. Bilateral testing is considered an integral part of a complete examination. Limited examinations for reoccurring indications may be performed as noted. The reflux portion of the exam is performed with the patient in reverse Trendelenburg.  +---------+---------------+---------+-----------+----------+-------------------+ RIGHT    CompressibilityPhasicitySpontaneityPropertiesThrombus Aging      +---------+---------------+---------+-----------+----------+-------------------+ CFV                                                   Not well visualized +---------+---------------+---------+-----------+----------+-------------------+ SFJ                                                   Not well visualized +---------+---------------+---------+-----------+----------+-------------------+ FV Prox  Full           Yes      Yes                                      +---------+---------------+---------+-----------+----------+-------------------+ FV Mid   Full                                                             +---------+---------------+---------+-----------+----------+-------------------+ FV DistalFull                                                             +---------+---------------+---------+-----------+----------+-------------------+ PFV                                                   Not well visualized +---------+---------------+---------+-----------+----------+-------------------+ POP      Partial        Yes      Yes                  Acute               +---------+---------------+---------+-----------+----------+-------------------+ PTV      Full                                                              +---------+---------------+---------+-----------+----------+-------------------+ PERO     None  Acute               +---------+---------------+---------+-----------+----------+-------------------+ Gastroc  None                                         Acute               +---------+---------------+---------+-----------+----------+-------------------+   +---------+---------------+---------+-----------+----------+-------------------+ LEFT     CompressibilityPhasicitySpontaneityPropertiesThrombus Aging      +---------+---------------+---------+-----------+----------+-------------------+ CFV                                                   Not well visualized +---------+---------------+---------+-----------+----------+-------------------+ SFJ                                                   Not well visualized +---------+---------------+---------+-----------+----------+-------------------+ FV Prox  Full           Yes      Yes                                      +---------+---------------+---------+-----------+----------+-------------------+ FV Mid   Full                                                             +---------+---------------+---------+-----------+----------+-------------------+ FV DistalFull           Yes      Yes                                      +---------+---------------+---------+-----------+----------+-------------------+ PFV                                                   Not well visualized +---------+---------------+---------+-----------+----------+-------------------+ POP      Full           Yes      Yes                                      +---------+---------------+---------+-----------+----------+-------------------+ PTV      Full                                                             +---------+---------------+---------+-----------+----------+-------------------+  PERO     Full                                                             +---------+---------------+---------+-----------+----------+-------------------+  Summary: RIGHT: - Findings consistent with acute deep vein thrombosis involving the right popliteal vein, right peroneal veins, and right gastrocnemius veins. - There is no evidence of deep vein thrombosis in the lower extremity. - There is no evidence of superficial venous thrombosis.  LEFT: - No evidence of common femoral vein obstruction.  *See table(s) above for measurements and observations. Electronically signed by Harold Barban MD on 09/09/2020 at 7:57:19 PM.    Final    ECHOCARDIOGRAM LIMITED  Result Date: 09/14/2020    ECHOCARDIOGRAM LIMITED REPORT   Patient Name:   HORALD BIRKY [facility] Date of Exam: 09/14/2020 Medical Rec #:  [id number]    Height:       66.0 in Accession #:    [id number]   Weight:       168.4 lb Date of Birth:  10-26-1955   BSA:          1.859 m Patient Age:    67 years     BP:           91/64 mmHg Patient Gender: M            HR:           99 bpm. Exam Location:  Inpatient Procedure: Limited Echo, Limited Color Doppler and Cardiac Doppler Indications:    atrial fibrillation  History:        Patient has prior history of Echocardiogram examinations, most                 recent 09/09/2020. CAD, Defibrillator, chronic kidney disease.                 Interstitial lung disease.; Risk Factors:Diabetes.  Sonographer:    Johny Chess Referring Phys: [id number] [place]  1. Left ventricular ejection fraction, by estimation, is 45 to 50%. The left ventricle has mildly decreased function. The left ventricle demonstrates regional wall motion abnormalities (see scoring diagram/findings for description).  2. Right ventricular systolic function is normal. The right ventricular size is normal.  3. The mitral valve is normal in structure. Trivial mitral valve regurgitation. No evidence of mitral stenosis.  4. The aortic valve is  grossly normal. There is mild calcification of the aortic valve. There is mild thickening of the aortic valve. Aortic valve regurgitation is not visualized. No aortic stenosis is present. Comparison(s): Prior images reviewed side by side. Conclusion(s)/Recommendation(s): Llimited study for EF. Patient is now in atrial fibrillation. The EF is mildly reduced, with slight focal wall motion abnormalities as noted. Prior images viewed side by side. The pattern is very similar to prior, but better seen with definity contrast on current study. Findings discussed with Dr. Margaretann Loveless. FINDINGS  Left Ventricle: Left ventricular ejection fraction, by estimation, is 45 to 50%. The left ventricle has mildly decreased function. The left ventricle demonstrates regional wall motion abnormalities. Definity contrast agent was given IV to delineate the left ventricular endocardial borders.  LV Wall Scoring: The anterior septum, inferior wall, basal inferoseptal segment, and apex are hypokinetic. The entire anterior wall, entire lateral wall, mid inferoseptal segment, apical septal segment, and apical inferior segment are normal. Right Ventricle: The right ventricular size is normal. Right ventricular systolic function is normal. Mitral Valve: The mitral valve is normal in structure. Trivial mitral valve regurgitation. No evidence of mitral valve stenosis. Tricuspid Valve: The tricuspid valve is normal in structure. Tricuspid valve regurgitation is trivial. No evidence of tricuspid stenosis. Aortic Valve: The aortic valve is grossly normal. There is  mild calcification of the aortic valve. There is mild thickening of the aortic valve. Aortic valve regurgitation is not visualized. No aortic stenosis is present. Pulmonic Valve: The pulmonic valve was not well visualized. Pulmonic valve regurgitation is trivial. No evidence of pulmonic stenosis. Additional Comments: A pacer wire is visualized in the right atrium and right ventricle. LEFT  VENTRICLE PLAX 2D LVIDd:         4.40 cm LVIDs:         3.80 cm LV PW:         0.90 cm LV IVS:        0.80 cm  LEFT ATRIUM         Index LA diam:    4.00 cm 2.15 cm/m Buford Dresser MD Electronically signed by Buford Dresser MD Signature Date/Time: 09/14/2020/8:51:40 PM    Final    ECHOCARDIOGRAM LIMITED  Result Date: 09/10/2020    ECHOCARDIOGRAM LIMITED REPORT   Patient Name:   DOLTON SHAKER [facility] Date of Exam: 09/09/2020 Medical Rec #:  [id number]    Height:       66.0 in Accession #:    [id number]   Weight:       163.0 lb Date of Birth:  12-06-1955   BSA:          1.833 m Patient Age:    70 years     BP:           99/66 mmHg Patient Gender: M            HR:           81 bpm. Exam Location:  Inpatient Procedure: Limited Echo, Limited Color Doppler and Cardiac Doppler Indications:    NSTEMI  History:        Patient has prior history of Echocardiogram examinations, most                 recent 08/16/2020. Defibrillator and Prior CABG, Covid. chronic                 kidney disease; Risk Factors:Diabetes.  Sonographer:    Johny Chess Referring Phys: [id number] [place]  1. Left ventricular ejection fraction, by estimation, is 50 to 55%. The left ventricle has low normal function. The left ventricle has no regional wall motion abnormalities. Left ventricular diastolic parameters are consistent with Grade I diastolic dysfunction (impaired relaxation).  2. Right ventricular systolic function is normal.  3. The mitral valve is normal in structure. No evidence of mitral valve regurgitation.  4. The aortic valve is tricuspid. There is mild calcification of the aortic valve. There is mild thickening of the aortic valve. Aortic valve regurgitation is not visualized. No aortic stenosis is present. Comparison(s): A prior study was performed on 08/16/2020. No significant change from prior study. Prior images reviewed side by side. FINDINGS  Left Ventricle: Left ventricular ejection fraction, by  estimation, is 50 to 55%. The left ventricle has low normal function. The left ventricle has no regional wall motion abnormalities. Abnormal (paradoxical) septal motion, consistent with RV pacemaker. Left ventricular diastolic parameters are consistent with Grade I diastolic dysfunction (impaired relaxation). Right Ventricle: Right ventricular systolic function is normal. Pericardium: Trivial pericardial effusion is present. Mitral Valve: The mitral valve is normal in structure. Tricuspid Valve: The tricuspid valve is grossly normal. Tricuspid valve regurgitation is not demonstrated. Aortic Valve: The aortic valve is tricuspid. There is mild calcification of the aortic valve. There is mild thickening of the aortic  valve. Aortic valve regurgitation is not visualized. No aortic stenosis is present. Aorta: The aortic root and ascending aorta are structurally normal, with no evidence of dilitation. Additional Comments: A pacer wire is visualized. LEFT VENTRICLE PLAX 2D LVIDd:         4.60 cm     Diastology LVIDs:         3.70 cm     LV e' medial:    5.55 cm/s LV PW:         1.00 cm     LV E/e' medial:  11.9 LV IVS:        0.80 cm     LV e' lateral:   6.53 cm/s                            LV E/e' lateral: 10.1  LV Volumes (MOD) LV vol d, MOD A2C: 67.7 ml LV vol s, MOD A2C: 40.4 ml LV SV MOD A2C:     27.3 ml IVC IVC diam: 1.50 cm LEFT ATRIUM         Index LA diam:    3.80 cm 2.07 cm/m  AORTIC VALVE LVOT Vmax:   70.30 cm/s LVOT Vmean:  45.000 cm/s LVOT VTI:    0.126 m  AORTA Ao Asc diam: 2.90 cm MITRAL VALVE MV Area (PHT): 4.63 cm     SHUNTS MV Decel Time: 164 msec     Systemic VTI: 0.13 m MV E velocity: 66.00 cm/s MV A velocity: 105.00 cm/s MV E/A ratio:  0.63 Rudean Haskell MD Electronically signed by Rudean Haskell MD Signature Date/Time: 09/10/2020/12:10:15 PM    Final     Micro Results     Recent Results (from the past 240 hour(s))  Resp Panel by RT-PCR (Flu A&B, Covid) Nasopharyngeal Swab      Status: Abnormal   Collection Time: 09/09/20 12:32 AM   Specimen: Nasopharyngeal Swab; Nasopharyngeal(NP) swabs in vial transport medium  Result Value Ref Range Status   SARS Coronavirus 2 by RT PCR POSITIVE (A) NEGATIVE Final    Comment: RESULT CALLED TO, READ BACK BY AND VERIFIED WITH: Elige Radon RN 09/09/20 0224 JDW (NOTE) SARS-CoV-2 target nucleic acids are DETECTED.  The SARS-CoV-2 RNA is generally detectable in upper respiratory specimens during the acute phase of infection. Positive results are indicative of the presence of the identified virus, but do not rule out bacterial infection or co-infection with other pathogens not detected by the test. Clinical correlation with patient history and other diagnostic information is necessary to determine patient infection status. The expected result is Negative.  Fact Sheet for Patients: EntrepreneurPulse.com.au  Fact Sheet for Healthcare Providers: IncredibleEmployment.be  This test is not yet approved or cleared by the [place] FDA and  has been authorized for detection and/or diagnosis of SARS-CoV-2 by FDA under an Emergency Use Authorization (EUA).  This EUA will remain in effect (meaning this test can be [place] ed) for the duration of  the COVID-19 declaration under Section 564(b)(1) of the Act, 21 U.S.C. section 360bbb-3(b)(1), unless the authorization is terminated or revoked sooner.     Influenza A by PCR NEGATIVE NEGATIVE Final   Influenza B by PCR NEGATIVE NEGATIVE Final    Comment: (NOTE) The Xpert Xpress SARS-CoV-2/FLU/RSV plus assay is intended as an aid in the diagnosis of influenza from Nasopharyngeal swab specimens and should not be used as a sole basis for treatment. Nasal washings and aspirates are unacceptable for  Xpert Xpress SARS-CoV-2/FLU/RSV testing.  Fact Sheet for Patients: EntrepreneurPulse.com.au  Fact Sheet for Healthcare  Providers: IncredibleEmployment.be  This test is not yet approved or cleared by the [place] FDA and has been authorized for detection and/or diagnosis of SARS-CoV-2 by FDA under an Emergency Use Authorization (EUA). This EUA will remain in effect (meaning this test can be used) for the duration of the COVID-19 declaration under Section 564(b)(1) of the Act, 21 U.S.C. section 360bbb-3(b)(1), unless the authorization is terminated or revoked.  Performed at [facility], [place] [address] [place], [place] [address]   Urine Culture     Status: Abnormal   Collection Time: 09/09/20 10:32 AM   Specimen: Urine, Random  Result Value Ref Range Status   Specimen Description URINE, RANDOM  Final   Special Requests   Final    Normal Performed at [facility], [place] [address] [place], [place] [address]    Culture >=100,000 COLONIES/mL KLEBSIELLA PNEUMONIAE (A)  Final   Report Status 09/11/2020 FINAL  Final   Organism ID, Bacteria KLEBSIELLA PNEUMONIAE (A)  Final      Susceptibility   Klebsiella pneumoniae - MIC*    AMPICILLIN >=32 RESISTANT Resistant     CEFAZOLIN <=4 SENSITIVE Sensitive     CEFEPIME <=0.12 SENSITIVE Sensitive     CEFTRIAXONE <=0.25 SENSITIVE Sensitive     CIPROFLOXACIN <=0.25 SENSITIVE Sensitive     GENTAMICIN <=1 SENSITIVE Sensitive     IMIPENEM <=0.25 SENSITIVE Sensitive     NITROFURANTOIN 64 INTERMEDIATE Intermediate     TRIMETH/SULFA <=20 SENSITIVE Sensitive     AMPICILLIN/SULBACTAM 4 SENSITIVE Sensitive     PIP/TAZO <=4 SENSITIVE Sensitive     * >=100,000 COLONIES/mL KLEBSIELLA PNEUMONIAE    Today   Subjective    Leana Gamer today has no headache,no chest abdominal pain,no new weakness tingling or numbness, feels much better wants to go home today.    Objective   Blood pressure 109/84, pulse 94, temperature 98.1 F (36.7 C), temperature source Oral, resp. rate 18, height 5' 6"  (1.676 m), weight 76.4 kg, SpO2 96  %.   Intake/Output Summary (Last 24 hours) at 09/16/2020 1020 Last data filed at 09/16/2020 0800 Gross per 24 hour  Intake 1440 ml  Output 2500 ml  Net -1060 ml    Exam  Awake Alert, No new F.N deficits, Normal affect [place].AT,PERRAL Supple Neck,No JVD, No cervical lymphadenopathy appriciated.  Symmetrical Chest wall movement, Good air movement bilaterally, CTAB RRR,No Gallops,Rubs or new Murmurs, No Parasternal Heave +ve B.Sounds, Abd Soft, Non tender, No organomegaly appriciated, No rebound -guarding or rigidity. No Cyanosis, Clubbing or edema, No new Rash or bruise   Data Review   CBC w Diff:  Lab Results  Component Value Date   WBC 17.5 (H) 09/16/2020   HGB 10.6 (L) 09/16/2020   HGB 12.1 (L) 09/11/2020   HCT 32.7 (L) 09/16/2020   HCT 37.8 07/05/2020   PLT 267 09/16/2020   PLT 226 07/05/2020   LYMPHOPCT 18 09/08/2020   MONOPCT 9 09/08/2020   EOSPCT 1 09/08/2020   BASOPCT 0 09/08/2020    CMP:  Lab Results  Component Value Date   NA 141 09/16/2020   NA 135 09/06/2020   K 3.4 (L) 09/16/2020   CL 112 (H) 09/16/2020   CO2 20 (L) 09/16/2020   BUN 44 (H) 09/16/2020   BUN 41 (H) 09/06/2020   CREATININE 1.82 (H) 09/16/2020   PROT 5.9 (L) 09/16/2020   PROT 6.8  05/26/2018   ALBUMIN 2.5 (L) 09/16/2020   ALBUMIN 4.2 05/26/2018   BILITOT 0.8 09/16/2020   BILITOT 0.8 05/26/2018   ALKPHOS 57 09/16/2020   AST 70 (H) 09/16/2020   ALT 155 (H) 09/16/2020  .  Lab Results  Component Value Date   INR 1.5 (H) 09/16/2020   INR 1.2 09/15/2020   INR 1.1 09/14/2020    Total Time in preparing paper work, data evaluation and todays exam - 62 minutes  Lala Lund M.D on 09/16/2020 at 10:20 AM  [facility]

## 2020-09-16 NOTE — TOC Transition Note (Addendum)
Transition of Care [facility]) - CM/SW Discharge Note   Patient Details  Name: Eugene [name] MRN: [id number] Date of Birth: 03-14-1956  Transition of Care [facility]) CM/SW Contact:  Zenon Mayo, RN Phone Number: 09/16/2020, 11:42 AM   Clinical Narrative:    NCM notified [place] of patient's dc today, and the additional services added to [facility]. She states ok. Patient is on lovenox injections will need HHRN to follow up on. Per previous note , he declines DME.  Per Staff RN, TOC has already filled his brilinta for him.    Final next level of care: [place] Barriers to Discharge: No Barriers Identified   Patient Goals and CMS Choice Patient states their goals for this hospitalization and ongoing recovery are:: home CMS Medicare.gov Compare Post Acute Care list provided to:: Patient Choice offered to / list presented to : Patient  Discharge Placement                       Discharge Plan and Services   Discharge Planning Services: CM Consult,Medication Assistance Post Acute Care Choice: Home Health          DME Arranged: N/A DME Agency: NA       HH Arranged: RN,PT,OT,Nurse's Aide,Social Work [employer] Agency: [facility] [employer] [facility] [employer] Date [place]: 09/16/20 Time [place]: 1142 Representative spoke with at [place]: [place]  Social Determinants of Health ([place]) Interventions     Readmission Risk Interventions No flowsheet data found.

## 2020-09-16 NOTE — Progress Notes (Signed)
ANTICOAGULATION CONSULT NOTE - Follow Up Consult  Pharmacy Consult for Heparin, warfarin  Indication: chest pain/ACS and DVT  No Known Allergies  Patient Measurements: Height: 5\' 6"  (167.6 cm) Weight: 76.4 kg (168 lb 6.9 oz) IBW/kg (Calculated) : 63.8 Heparin Dosing Weight: 73.9 kg  Vital Signs: Temp: 98.1 F (36.7 C) (02/19 0430) Temp Source: Oral (02/19 0430) BP: 109/84 (02/19 0430) Pulse Rate: 94 (02/18 2015)  Labs: Recent Labs    09/13/20 1950 09/14/20 0300 09/14/20 0300 09/14/20 0551 09/14/20 1018 09/15/20 0204 09/15/20 0559 09/16/20 0343  HGB  --  10.3*   < >  --   --  10.4*  --  10.6*  HCT  --  32.0*  --   --   --  32.8*  --  32.7*  PLT  --  230  --   --   --  233  --  267  LABPROT  --  13.4  --   --   --  15.0  --  17.8*  INR  --  1.1  --   --   --  1.2  --  1.5*  HEPARINUNFRC 0.27* 0.43  --   --  0.55  --   --   --   CREATININE  --  2.04*  --   --   --  1.97*  --  1.82*  CKTOTAL  --  2,423*  --   --   --  1,128*  --  932*  CKMB  --  70.8*  --   --   --  22.4*  --  11.8*  TROPONINIHS  --   --   --  49,675*  --   --  9,605*  --    < > = values in this interval not displayed.    Estimated Creatinine Clearance: 36.5 mL/min (A) (by C-G formula based on SCr of 1.82 mg/dL (H)).  Assessment: 65 yr old male on heparin post cath for RLE DVT.  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  Warfarin started 2/16. Heparin infusion changed to Lovenox on 2/17 for warfarin bridging.  Lovenox cost for 5 days is about $95 and he is aware and able to pay the cost.  Today's INR is subtherapeutic but trending up at 1.5 after 3 days of warfarin. Hgb and platelets stable. Scr 1.82, CrCl now increased to > 30 ml/min.  Goal of Therapy:  INR 2-3 Monitor platelets by anticoagulation protocol: Yes   Plan:  Warfarin 10mg  today Increase Lovenox to 80mg  subq q12h (1 mg/kg q12h) Daily INR Monitor renal function  Fara Olden, PharmD PGY-1 Pharmacy Resident 09/16/2020  7:25 AM Please see AMION for all pharmacy numbers

## 2020-09-19 ENCOUNTER — Other Ambulatory Visit: Payer: Self-pay

## 2020-09-19 DIAGNOSIS — I5189 Other ill-defined heart diseases: Secondary | ICD-10-CM | POA: Diagnosis not present

## 2020-09-19 DIAGNOSIS — I4891 Unspecified atrial fibrillation: Secondary | ICD-10-CM | POA: Diagnosis not present

## 2020-09-19 DIAGNOSIS — I2510 Atherosclerotic heart disease of native coronary artery without angina pectoris: Secondary | ICD-10-CM | POA: Diagnosis not present

## 2020-09-19 DIAGNOSIS — I130 Hypertensive heart and chronic kidney disease with heart failure and stage 1 through stage 4 chronic kidney disease, or unspecified chronic kidney disease: Secondary | ICD-10-CM | POA: Diagnosis not present

## 2020-09-19 DIAGNOSIS — I214 Non-ST elevation (NSTEMI) myocardial infarction: Secondary | ICD-10-CM | POA: Diagnosis not present

## 2020-09-19 DIAGNOSIS — E1159 Type 2 diabetes mellitus with other circulatory complications: Secondary | ICD-10-CM | POA: Diagnosis not present

## 2020-09-19 DIAGNOSIS — J849 Interstitial pulmonary disease, unspecified: Secondary | ICD-10-CM | POA: Diagnosis not present

## 2020-09-19 DIAGNOSIS — D126 Benign neoplasm of colon, unspecified: Secondary | ICD-10-CM | POA: Diagnosis not present

## 2020-09-19 DIAGNOSIS — U071 COVID-19: Secondary | ICD-10-CM | POA: Diagnosis not present

## 2020-09-19 DIAGNOSIS — Z794 Long term (current) use of insulin: Secondary | ICD-10-CM | POA: Diagnosis not present

## 2020-09-19 DIAGNOSIS — I4901 Ventricular fibrillation: Secondary | ICD-10-CM | POA: Diagnosis not present

## 2020-09-19 DIAGNOSIS — N390 Urinary tract infection, site not specified: Secondary | ICD-10-CM | POA: Diagnosis not present

## 2020-09-19 DIAGNOSIS — I82401 Acute embolism and thrombosis of unspecified deep veins of right lower extremity: Secondary | ICD-10-CM | POA: Diagnosis not present

## 2020-09-19 DIAGNOSIS — E1351 Other specified diabetes mellitus with diabetic peripheral angiopathy without gangrene: Secondary | ICD-10-CM | POA: Diagnosis not present

## 2020-09-19 DIAGNOSIS — K8020 Calculus of gallbladder without cholecystitis without obstruction: Secondary | ICD-10-CM | POA: Diagnosis not present

## 2020-09-19 DIAGNOSIS — N184 Chronic kidney disease, stage 4 (severe): Secondary | ICD-10-CM | POA: Diagnosis not present

## 2020-09-19 DIAGNOSIS — Z48812 Encounter for surgical aftercare following surgery on the circulatory system: Secondary | ICD-10-CM | POA: Diagnosis not present

## 2020-09-19 DIAGNOSIS — E0821 Diabetes mellitus due to underlying condition with diabetic nephropathy: Secondary | ICD-10-CM | POA: Diagnosis not present

## 2020-09-19 DIAGNOSIS — R7401 Elevation of levels of liver transaminase levels: Secondary | ICD-10-CM | POA: Diagnosis not present

## 2020-09-19 DIAGNOSIS — D631 Anemia in chronic kidney disease: Secondary | ICD-10-CM | POA: Diagnosis not present

## 2020-09-19 DIAGNOSIS — R29898 Other symptoms and signs involving the musculoskeletal system: Secondary | ICD-10-CM | POA: Diagnosis not present

## 2020-09-19 DIAGNOSIS — I82431 Acute embolism and thrombosis of right popliteal vein: Secondary | ICD-10-CM | POA: Diagnosis not present

## 2020-09-19 DIAGNOSIS — Z95810 Presence of automatic (implantable) cardiac defibrillator: Secondary | ICD-10-CM | POA: Diagnosis not present

## 2020-09-19 DIAGNOSIS — E559 Vitamin D deficiency, unspecified: Secondary | ICD-10-CM | POA: Diagnosis not present

## 2020-09-19 DIAGNOSIS — B961 Klebsiella pneumoniae [K. pneumoniae] as the cause of diseases classified elsewhere: Secondary | ICD-10-CM | POA: Diagnosis not present

## 2020-09-19 NOTE — Patient Outreach (Signed)
[name] Care Management  09/19/2020  Eugene [name] 1955/10/16 [id number]   Referral Date: 09/19/20 Referral Source: [employer] Report Date of Discharge: 09/16/20 Facility:  [place]: [facility]   Referral received.  No outreach warranted at this time.  Transition of Care calls being completed via EMMI. RN CM will outreach patient for any red flags received.    Plan: RN CM will close case.    [name], RN, MSN [facility] Care Management Care Management Coordinator Direct Line [phone] Toll Free: [phone]  Fax: [phone]

## 2020-09-20 DIAGNOSIS — I4891 Unspecified atrial fibrillation: Secondary | ICD-10-CM | POA: Diagnosis not present

## 2020-09-21 ENCOUNTER — Ambulatory Visit: Payer: Medicare HMO | Admitting: Internal Medicine

## 2020-09-21 ENCOUNTER — Encounter: Payer: Self-pay | Admitting: Internal Medicine

## 2020-09-21 ENCOUNTER — Other Ambulatory Visit: Payer: Self-pay

## 2020-09-21 VITALS — BP 102/60 | HR 70 | Ht 66.0 in | Wt 164.2 lb

## 2020-09-21 DIAGNOSIS — I82409 Acute embolism and thrombosis of unspecified deep veins of unspecified lower extremity: Secondary | ICD-10-CM | POA: Diagnosis not present

## 2020-09-21 DIAGNOSIS — E1169 Type 2 diabetes mellitus with other specified complication: Secondary | ICD-10-CM | POA: Diagnosis not present

## 2020-09-21 DIAGNOSIS — E785 Hyperlipidemia, unspecified: Secondary | ICD-10-CM | POA: Diagnosis not present

## 2020-09-21 DIAGNOSIS — I152 Hypertension secondary to endocrine disorders: Secondary | ICD-10-CM | POA: Diagnosis not present

## 2020-09-21 DIAGNOSIS — Z8616 Personal history of COVID-19: Secondary | ICD-10-CM

## 2020-09-21 DIAGNOSIS — N184 Chronic kidney disease, stage 4 (severe): Secondary | ICD-10-CM | POA: Diagnosis not present

## 2020-09-21 DIAGNOSIS — M609 Myositis, unspecified: Secondary | ICD-10-CM | POA: Diagnosis not present

## 2020-09-21 DIAGNOSIS — E1159 Type 2 diabetes mellitus with other circulatory complications: Secondary | ICD-10-CM | POA: Diagnosis not present

## 2020-09-21 DIAGNOSIS — I2510 Atherosclerotic heart disease of native coronary artery without angina pectoris: Secondary | ICD-10-CM | POA: Diagnosis not present

## 2020-09-21 DIAGNOSIS — D8989 Other specified disorders involving the immune mechanism, not elsewhere classified: Secondary | ICD-10-CM | POA: Diagnosis not present

## 2020-09-21 DIAGNOSIS — R634 Abnormal weight loss: Secondary | ICD-10-CM | POA: Diagnosis not present

## 2020-09-21 DIAGNOSIS — I739 Peripheral vascular disease, unspecified: Secondary | ICD-10-CM

## 2020-09-21 DIAGNOSIS — J849 Interstitial pulmonary disease, unspecified: Secondary | ICD-10-CM | POA: Diagnosis not present

## 2020-09-21 DIAGNOSIS — Z79899 Other long term (current) drug therapy: Secondary | ICD-10-CM | POA: Diagnosis not present

## 2020-09-21 DIAGNOSIS — M1990 Unspecified osteoarthritis, unspecified site: Secondary | ICD-10-CM | POA: Diagnosis not present

## 2020-09-21 DIAGNOSIS — N1832 Chronic kidney disease, stage 3b: Secondary | ICD-10-CM | POA: Diagnosis not present

## 2020-09-21 DIAGNOSIS — G7249 Other inflammatory and immune myopathies, not elsewhere classified: Secondary | ICD-10-CM | POA: Diagnosis not present

## 2020-09-21 MED ORDER — CLOPIDOGREL BISULFATE 300 MG PO TABS: ORAL_TABLET | ORAL | 0 refills | Status: DC

## 2020-09-21 MED ORDER — CLOPIDOGREL BISULFATE 75 MG PO TABS: 75.0000 mg | ORAL_TABLET | Freq: Every day | ORAL | 3 refills | Status: DC

## 2020-09-21 NOTE — Patient Instructions (Addendum)
Medication Instructions:  Your physician has recommended you make the following change in your medication: STOP: Aspirin  STOP: Brilinta  START: clopidogrel (Plavix) 300mg  one dose on 09/22/20  START: clopidogrel (Plavix) 75mg  by mouth once daily starting 09/23/20  *If you need a refill on your cardiac medications before your next appointment, please call your pharmacy*   Lab Work: NONE If you have labs (blood work) drawn today and your tests are completely normal, you will receive your results only by: Marland Kitchen MyChart Message (if you have MyChart) OR . A paper copy in the mail If you have any lab test that is abnormal or we need to change your treatment, we will call you to review the results.   Testing/Procedures: NONE   Follow-Up: At [facility], you and your health needs are our priority.  As part of our continuing mission to provide you with exceptional heart care, we have created designated Provider Care Teams.  These Care Teams include your primary Cardiologist (physician) and Advanced Practice Providers (APPs -  Physician Assistants and Nurse Practitioners) who all work together to provide you with the care you need, when you need it.  We recommend signing up for the patient portal called "MyChart".  Sign up information is provided on this After Visit Summary.  MyChart is used to connect with patients for Virtual Visits (Telemedicine).  Patients are able to view lab/test results, encounter notes, upcoming appointments, etc.  Non-urgent messages can be sent to your provider as well.   To learn more about what you can do with MyChart, go to [email or web address].    Your next appointment:   3 month(s)  The format for your next appointment:   In Person  Provider:   You may see Gasper Sells, MD or one of the following Advanced Practice Providers on your designated Care Team:    Melina Copa, PA-C  Ermalinda Barrios, PA-C    Other Instructions Call Kim at [phone] to  assist you in sending in transmission for pacemaker.

## 2020-09-21 NOTE — Progress Notes (Signed)
Cardiology Office Note:    Date:  09/21/2020   ID:  Eugene [name], Eugene [name] 1955-12-06, MRN [id number]  PCP:  [name], MD  [facility] Cardiologist:  [place] Electrophysiologist:  Cristopher Peru, MD   Referring MD: [name], MD   CC: Follow up [place] hospitalization  History of Present Illness:    Eugene [name] is a 65 y.o. male with a hx of CAD (distant CABG, CTO oLAD and pRCA. Severe diffuse mid/distal LAD and LCx disease, patent LIMA-LAD, SVG-OM2 with 70% proximal graft stenosis and 95% OM2 lesion distal to SVG anastomosis., CTO SVG-ramus and SVG-rPDA with 12/21 PCI to proximal SVG-OM2; PCI to OM2), HFpEF, DM with HTN, HLD, Prior Stroke, PAD with prior cath and no intervention (smal vessels 2016), Prior VF Arrest s/p St. Jude ICD seen by Dr. Lovena Le who presents for evaluation 07/26/20.  Had augmented his lasix management.  Had Cutting Balloon 09/12/20.  Seen 09/08/20 for new leg weakness.  Found to have DVT and started on coumadin.  Concern he may have had atrial fibrillation during this hospitalization (09/14/20). Found to have positive rheumatic work up and continued on statin.  Found to have COVID-19.  Patient notes that he is doing OK.  Notes some AM nausea with the prednisone.  No chest pain or pressure .  No SOB/DOE and no PND/Orthopnea.  No weight gain or leg swelling.  No palpitations or syncope.  Still having some leg weakness, but it is improving.  No leg pain.   Past Medical History:  Diagnosis Date  . Abnormal nuclear stress test 02/19/2018  . Angina, class III ([place]) 02/19/2018  . Anxiety   . Asthma   . CHF (congestive heart failure) ([place])   . Coronary artery disease   . DDD (degenerative disc disease), cervical 03/01/2013  . Diabetes mellitus    type 2  . 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 Dyslipidemia   . Hypertension   . Lumbosacral radiculopathy at L4 02/18/2017  . Myocardial infarction ([place]) 2012  . Obesity   . Sleep apnea  08/17/2013  . Snoring 10/12/2013  . TIA (transient ischemic attack) 08/09/2014  . Tubular adenoma of colon 01/12/2014   transverse colon  . VF (ventricular fibrillation) ([place])    arrest  . Vitamin D deficiency 02/18/2017    Past Surgical History:  Procedure Laterality Date  . ABDOMINAL AORTAGRAM N/A 12/14/2013   Procedure: ABDOMINAL Maxcine Ham;  Surgeon: [name], MD;  Location: [facility] CATH LAB;  Service: Cardiovascular;  Laterality: N/A;  . CARDIAC CATHETERIZATION  09/16/10  . COLONOSCOPY  01/12/2014  . CORONARY ARTERY BYPASS GRAFT  2006  . CORONARY BALLOON ANGIOPLASTY N/A 09/12/2020   Procedure: CORONARY BALLOON ANGIOPLASTY;  Surgeon: Troy Sine, MD;  Location: [place] CV LAB;  Service: Cardiovascular;  Laterality: N/A;  . CORONARY STENT INTERVENTION  07/06/2020  . CORONARY STENT INTERVENTION N/A 07/06/2020   Procedure: CORONARY STENT INTERVENTION;  Surgeon: Nelva Bush, MD;  Location: [place] CV LAB;  Service: Cardiovascular;  Laterality: N/A;  . IMPLANTABLE CARDIOVERTER DEFIBRILLATOR IMPLANT  2012   STJ ICD- single chamber  . LEFT HEART CATH AND CORS/GRAFTS ANGIOGRAPHY N/A 02/19/2018   Procedure: LEFT HEART CATH AND CORS/GRAFTS ANGIOGRAPHY;  Surgeon: [name], MD;  Location: [place] CV LAB;  Service: Cardiovascular;  Laterality: N/A;  . LEFT HEART CATH AND CORS/GRAFTS ANGIOGRAPHY N/A 07/06/2020   Procedure: LEFT HEART CATH AND CORS/GRAFTS ANGIOGRAPHY;  Surgeon: Nelva Bush, MD;  Location:  [place] INVASIVE CV LAB;  Service: Cardiovascular;  Laterality: N/A;  . LEFT HEART CATH AND CORS/GRAFTS ANGIOGRAPHY N/A 09/12/2020   Procedure: LEFT HEART CATH AND CORS/GRAFTS ANGIOGRAPHY;  Surgeon: Troy Sine, MD;  Location: [place] CV LAB;  Service: Cardiovascular;  Laterality: N/A;  . LOOP RECORDER IMPLANT N/A 08/24/2014   Procedure: LOOP RECORDER IMPLANT;  Surgeon: [name], MD;  Location: [facility] CATH LAB;  Service: Cardiovascular;  Laterality: N/A;  . MASS  EXCISION  2008   right shoulder  . STERNECTOMY      Current Medications: Current Meds  Medication Sig  . atorvastatin (LIPITOR) 80 MG tablet Take 1 tablet (80 mg total) by mouth daily.  . clopidogrel (PLAVIX) 300 MG TABS tablet Take 09/22/20  . clopidogrel (PLAVIX) 75 MG tablet Take 1 tablet (75 mg total) by mouth daily.  . dapagliflozin propanediol (FARXIGA) 10 MG TABS tablet Take 10 mg by mouth at bedtime.  . enoxaparin (LOVENOX) 80 MG/0.8ML injection Inject 0.8 mLs (80 mg total) into the skin daily.  . furosemide (LASIX) 40 MG tablet Take 1 tablet (40 mg total) by mouth daily.  . insulin aspart (NOVOLOG) 100 UNIT/ML injection Substitute to any brand approved.Before each meal 3 times a day, 140-199 - 2 units, 200-250 - 4 units, 251-299 - 6 units,  300-349 - 8 units,  350 or above 10 units. Dispense syringes and needles as needed, Ok to switch to PEN if approved. DX DM2, Code E11.65  . insulin glargine (LANTUS) 100 UNIT/ML Solostar Pen Inject 70 Units into the skin daily before breakfast.  . Insulin Syringe-Needle U-100 25G X 1" 1 ML MISC For 4 times a day insulin SQ, 1 month supply. Diagnosis E11.65  . methocarbamol (ROBAXIN) 500 MG tablet Take 500 mg by mouth 2 (two) times daily.  . metoprolol succinate (TOPROL-XL) 50 MG 24 hr tablet TAKE ONE TABLET BY MOUTH ONE TIME DAILY. TAKE WITH OR IMMEDIATELY FOLLOWING A MEAL  . nitroGLYCERIN (NITROSTAT) 0.4 MG SL tablet Place 0.4 mg under the tongue every 5 (five) minutes as needed for chest pain.  . pantoprazole (PROTONIX) 40 MG tablet Take 40 mg by mouth daily before breakfast.  . predniSONE (DELTASONE) 5 MG tablet 8 Pills PO for 3 days,6 Pills PO for 3 days,4 Pills PO for 3 days,2 Pills PO for 3 days,1 Pills PO for 3 days,1/2 Pill  PO for 3 days then STOP.  Marland Kitchen ranolazine (RANEXA) 500 MG 12 hr tablet TAKE ONE TABLET BY MOUTH TWICE DAILY.PLEASE MAKE APPT WITH DR.HARDING FOR REFILLS.  (Patient taking differently: Take 500 mg by mouth 2 (two) times  daily.)  . Semaglutide, 1 MG/DOSE, (OZEMPIC, 1 MG/DOSE,) 4 MG/3ML SOPN Inject 1 mg into the skin every Tuesday.  . vitamin B-12 (CYANOCOBALAMIN) 1000 MCG tablet Take 1 tablet (1,000 mcg total) by mouth daily.  Marland Kitchen warfarin (COUMADIN) 5 MG tablet Take 1 tablet (5 mg total) by mouth daily at 4 PM.  . [DISCONTINUED] aspirin 81 MG tablet Take 81 mg by mouth daily.   . [DISCONTINUED] ticagrelor (BRILINTA) 90 MG TABS tablet Take 1 tablet (90 mg total) by mouth 2 (two) times daily.     Allergies:   Patient has no known allergies.   Social History   Socioeconomic History  . Marital status: Single    Spouse name: Not on file  . Number of children: 0  . Years of education: Not on file  . Highest education level: Not on file  Occupational History  .  Occupation: STORE Programme researcher, broadcasting/film/video: [employer]    Comment: [place], [place]  Tobacco Use  . Smoking status: Never Smoker  . Smokeless tobacco: Never Used  Vaping Use  . Vaping Use: Never used  Substance and Sexual Activity  . Alcohol use: No  . Drug use: No  . Sexual activity: Not on file  Other Topics Concern  . 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  ROS:   Please see the history of present illness.     All other systems reviewed and are negative.  EKGs/Labs/Other Studies Reviewed:    The following studies  were reviewed today:  EKG:   07/10/20:  SR 1st HB rate 68 iRBBB, LAD Nonspecific TWI  Transthoracic Echocardiogram: Date: 07/06/20 Results:Technical difficult study for LV function Left ventricular ejection fraction, by estimation, is 50 to 55%. Theleft ventricle has low normal function. There is mild left ventricular hypertrophy. Left ventricular diastolic parameters are consistent with  Grade I diastolic dysfunction (impaired relaxation).  2. Right ventricular systolic function is normal. The right ventricular  size is normal.  3. The mitral valve is abnormal. Trivial mitral valve regurgitation.  4. The aortic valve is abnormal. Aortic valve regurgitation is not  visualized. Mild to moderate aortic valve sclerosis/calcification is  present, without any evidence of aortic stenosis.  Date: 09/14/20 1. Left ventricular ejection fraction, by estimation, is  50%. The  left ventricle has mildly decreased function. The left ventricle  demonstrates regional wall motion abnormalities (see scoring  diagram/findings for description).  2. Right ventricular systolic function is normal. The right ventricular  size is normal.  3. The mitral valve is normal in structure. Trivial mitral valve  regurgitation. No evidence of mitral stenosis.  4. The aortic valve is grossly normal. There is mild calcification of the  aortic valve. There is mild thickening of the aortic valve. Aortic valve  regurgitation is not visualized. No aortic stenosis is present.   NM Stress Testing : Date:02/16/2018 Results:  Nuclear stress EF: 42%.  There was no ST segment deviation noted during stress.  Defect 1: There is a medium defect of severe severity present in the basal anterior, mid anterior, mid anteroseptal, apical anterior and apical septal location.  Findings consistent with prior myocardial infarction with peri-infarct ischemia.  The left ventricular ejection fraction is moderately decreased  (30-44%).  This is an intermediate risk study.   Left/Right Heart Catheterizations: Date: 07/06/20 Results: LHC Conclusions: 1. Severe native coronary artery disease, including chronic total occlusions of the ostial LAD and proximal RCA.  Severe diffuse mid/distal LAD and LCx disease is also present. 2. Widely patent LIMA-LAD. 3. Patent SVG-OM2 with 70% proximal graft stenosis and 95% OM2 lesion distal to SVG anastomosis. 4. Known chronic total occlusions of SVG-ramus and SVG-rPDA (not engaged on today's study).  5. Normal left ventricular filling pressure. 6. Successful PCI to proximal SVG-OM2 using Resolute Onyx 4.0 x 18 mm drug-eluting stent with 0% residual stenosis and TIMI-3 flow. 7. Successful PCI to OM2 using Resolute Onyx 2.0 x 12 mm drug-eluting stent with 0% residual stenosis and TIMI-3 flow.  Recommendations: 1. Overnight post-cath recovery and gentle hydration in the setting of chronic kidney disease. 2. Continue indefinite DAPT with aspirin and clopidogrel. 3. Aggressive secondary prevention and medical management of residual coronary artery disease.  There are no other targets for revascularization.   Recent Labs: 08/04/2020: NT-Pro BNP 2,329 09/09/2020: TSH 2.186 09/14/2020: Magnesium 2.3 09/16/2020: ALT 155; B Natriuretic Peptide 1,504.0; BUN 44; Creatinine, Ser 1.82; Hemoglobin 10.6; Platelets 267; Potassium 3.4; Sodium 141  Recent Lipid Panel    Component Value Date/Time   CHOL 117 09/09/2020 0409   CHOL 140 05/26/2018 0813   TRIG 211 (H) 09/09/2020 0409   HDL 29 (L) 09/09/2020 0409   HDL 26 (L) 05/26/2018 0813   CHOLHDL 4.0 09/09/2020 0409   VLDL 42 (H) 09/09/2020 0409   LDLCALC 46 09/09/2020 0409   LDLCALC 54 05/26/2018 0813   Risk Assessment/Calculations:     N/A  Physical Exam:    VS:  BP 102/60   Pulse 70   Ht 5\' 6"  (1.676 m)   Wt 164 lb 3.2 oz (74.5 kg)   SpO2 99%   BMI 26.50 kg/m     Wt Readings from Last 3 Encounters:  09/21/20 164 lb 3.2  oz (74.5 kg)  09/14/20 168 lb 6.9 oz (76.4 kg)  08/04/20 176 lb 12.8 oz (80.2 kg)     GEN:  Well nourished, well developed in no acute distress HEENT: Bilateral Frank's Sign NECK: No JVD; No carotid bruits LYMPHATICS: No lymphadenopathy CARDIAC: RRR, no murmurs, rubs, gallops RESPIRATORY:  Good air movement with no crackles and good air movement ABDOMEN: Soft, non-tender, non-distended MUSCULOSKELETAL:  No edema; No deformity  SKIN: Warm and dry NEUROLOGIC:  Alert and oriented x 3 PSYCHIATRIC:  Normal affect   ASSESSMENT:    1. Atherosclerosis of native coronary artery of native heart without angina pectoris   2. Hypertension associated with diabetes ([place])   3. Hyperlipidemia associated with type 2 diabetes mellitus ([place])   4. CKD (chronic kidney disease) stage 4, GFR 15-29 ml/min (HCC)   5. ILD (interstitial lung disease) ([place])   6. PAD (peripheral artery disease) ([place])   7. History of COVID-19    PLAN:    In order of problems listed above:  Coronary Artery Disease; Obstructive Diabetes with HTN and HLD New DVT  Hx of COVID-19 - asymptomatic - anatomy: distant CABG, CTO ostial LAD and proxi RCA. Severe diffuse mid/distal LAD and LCx disease, patent LIMA-LAD, SVG-OM2 with 70% proximal graft stenosis and 95% OM2 lesion distal to SVG anastomosis., CTO SVG-ramus and SVG-rPDA with 12/21 PCI to proximal SVG-OM2; PCI to OM2) - will switch to Plavix (one time 300 mg loading dose then 75 mg PO daily) - continue warfarin at per PCP - continue statin, goal LDL < 70 - continue metoprolol succinate 50 mg PO daily - continue nitrates; as PRN; if worsening chest pain will start IMDUR  HFpEF CKD STAGE IIIb-IV History of ILD seeing pulmonary  - stable continue lasix 40 mg PO daily - Continue SGLT2i  Peripheral Arterial Disease - asymptomatic - ASA 81 mg / Brilinta 75 mg - Continue statin - No Tobacco Use  - will discuss referral at next eval if stable  Query of Atrial  Fibrillation Hx of VF arrest - will get his device interrogated    ADDENDUM:  See Telephone note from 09/22/20:  Discussed with patient's rheumatologist about this risks and benefit of muscle biopsy.  We are going to stop his statin, which means that we will defer his biopsy.  For this reason, he is back on one month of Triple Therapy.  He device is only single lead and does not show our ability to show AF.  No VT or VF.  Three months follow up unless new symptoms or abnormal test results warranting change in plan  Would be reasonable for  Video Visit Follow up Would be reasonable for  APP Follow up   Medication Adjustments/Labs and Tests Ordered: Current medicines are reviewed at length with the patient today.  Concerns regarding medicines are outlined above.  No orders of the defined types were placed in this encounter.  Meds ordered this encounter  Medications  . clopidogrel (PLAVIX) 300 MG TABS tablet    Sig: Take 09/22/20    Dispense:  1 tablet    Refill:  0  . clopidogrel (PLAVIX) 75 MG tablet    Sig: Take 1 tablet (75 mg total) by mouth daily.    Dispense:  90 tablet    Refill:  3    Patient Instructions  Medication Instructions:  Your physician has recommended you make the following change in your medication: STOP: Aspirin  STOP: Brilinta  START: clopidogrel (Plavix) 300mg  one dose on 09/22/20  START: clopidogrel (Plavix) 75mg  by mouth once daily starting 09/23/20  *If you need a refill on your cardiac medications before your next appointment, please call your pharmacy*   Lab Work: NONE If you have labs (blood work) drawn today and your tests are completely normal, you will receive your results only by: Marland Kitchen MyChart Message (if you have MyChart) OR . A paper copy in the mail If you have any lab test that is abnormal or we need to change your treatment, we will call you to review the results.   Testing/Procedures: NONE   Follow-Up: At [facility], you and  your health needs are our priority.  As part of our continuing mission to provide you with exceptional heart care, we have created designated Provider Care Teams.  These Care Teams include your primary Cardiologist (physician) and Advanced Practice Providers (APPs -  Physician Assistants and Nurse Practitioners) who all work together to provide you with the care you need, when you need it.  We recommend signing up for the patient portal called "MyChart".  Sign up information is provided on this After Visit Summary.  MyChart is used to connect with patients for Virtual Visits (Telemedicine).  Patients are able to view lab/test results, encounter notes, upcoming appointments, etc.  Non-urgent messages can be sent to your provider as well.   To learn more about what you can do with MyChart, go to [email or web address].    Your next appointment:   3 month(s)  The format for your next appointment:   In Person  Provider:   You may see Gasper Sells, MD or one of the following Advanced Practice Providers on your designated Care Team:    Melina Copa, PA-C  Ermalinda Barrios, PA-C    Other Instructions Call Kim at [phone] to assist you in sending in transmission for pacemaker.      Signed, Werner Lean, MD  09/21/2020 11:18 AM    [place]

## 2020-09-22 ENCOUNTER — Other Ambulatory Visit: Payer: Self-pay

## 2020-09-22 ENCOUNTER — Telehealth: Payer: Self-pay | Admitting: Internal Medicine

## 2020-09-22 ENCOUNTER — Telehealth (HOSPITAL_COMMUNITY): Payer: Self-pay

## 2020-09-22 DIAGNOSIS — E1169 Type 2 diabetes mellitus with other specified complication: Secondary | ICD-10-CM

## 2020-09-22 DIAGNOSIS — E785 Hyperlipidemia, unspecified: Secondary | ICD-10-CM

## 2020-09-22 LAB — POCT ACTIVATED CLOTTING TIME: Activated Clotting Time: 172 seconds

## 2020-09-22 MED ORDER — TICAGRELOR 90 MG PO TABS: 90.0000 mg | ORAL_TABLET | Freq: Two times a day (BID) | ORAL | 3 refills | Status: DC

## 2020-09-22 MED ORDER — ASPIRIN EC 81 MG PO TBEC: 81.0000 mg | DELAYED_RELEASE_TABLET | Freq: Every day | ORAL | 11 refills | Status: DC

## 2020-09-22 NOTE — Progress Notes (Signed)
Dr. Gasper Sells ordered this patient no longer take statin medications per consult with rheumatologist.  Pt called told to stop taking atorvastatin and that our lipid clinic would reach out to him to schedule an appointment to discuss alternatives.  Patient agreeable to plan of care.

## 2020-09-22 NOTE — Telephone Encounter (Signed)
Called by patient's rheumatologist to discuss care.  Given his elevation in transaminitis and unclear myalgias; we discussed that it would be reasonable to stop his atorvastatin.  This changes the risk benefit of triple therapy after cutting balloon procedure.  Given his considerable CAD, would send to lipid clinic to see if he has any financial options for a PCKS9i.     Summary of our conversation: Restart ASA 81 and Brilinta Stop Plavix Stop Lipitor Will defer Biopsy for at least 1 month Will Reach out Plavix  Patient and Fiance were unclear of INR and lovenox needs.   We will refer patient to lipid clinic for possible PSCK9i We will send a copy of this note to Patient's Rheumatologist and Dr. Forde Dandy to help coordinate care.  We will check in with patient in one month for possible transition to [facility] and plavix.  Werner Lean, MD

## 2020-09-22 NOTE — Telephone Encounter (Signed)
Pharmacy Transitions of Care Follow-up Telephone Call  Date of discharge: 09/15/20 Discharge Diagnosis: coronary angioplasty  How have you been since you were released from the hospital? Patient has been well. Cardiology actually changed patient from [place] to clopidogrel and removed   His baby aspirin due to concern for bleeding. Patient is still on lovenox but no date for stopping. Instructed patient to call Cardiology fr them to give an estimated date for stopping. Patient aware of s/sx of bleeding.  Medication changes made at discharge: yes  Medication changes obtained and verified? yes    Medication Accessibility:  Home Pharmacy: [place]   Was the patient provided with refills on discharged medications? yes   Have all prescriptions been transferred from [facility] to home pharmacy? yes  . Is the patient able to afford medications? yes    Medication Review:  CLOPIDOGREL (PLAVIX) Clopidogrel 75 mg once daily.  - patient take off baby aspirin by cardiology - Advised patient of medications to avoid (NSAIDs, ASA)  - Educated that Tylenol (acetaminophen) will be the preferred analgesic to prevent risk of bleeding  - Emphasized importance of monitoring for signs and symptoms of bleeding (abnormal bruising, prolonged bleeding, nose bleeds, bleeding from gums, discolored urine, black tarry stools)  - Advised patient to alert all providers of anticoagulation therapy prior to starting a new medication or having a procedure    Follow-up Appointments:  Patient had follow up with Cardiology with Dr. Gasper Sells. Patient's wife states they have another follow up next Wednesday but couldn't remember the office while on the phone.    If their condition worsens, is the pt aware to call PCP or go to the Emergency Dept.? yes  Final Patient Assessment: Patient is well. Has follow ups scheduled and refills at home pharmacy

## 2020-09-22 NOTE — Telephone Encounter (Signed)
Transmission received and reviewed. The patient has a single chamber ICD which does not allow [place] to monitor any atrial arrythmias. There are no current alerts on the device. Normal device function.

## 2020-09-22 NOTE — Telephone Encounter (Signed)
-----   Message from Edd Arbour, [place] sent at 09/22/2020 12:30 PM EST ----- The patient was seen by Dr. Gasper Sells, on 09/20/2020. He asked for the patient to send a transmission for it to be reviewed because he was concerned if the patient had some A-fib while in the hospital. Transmission received 09/22/2020.

## 2020-09-22 NOTE — Progress Notes (Signed)
Orders placed per Dr. Gasper Sells request.  Stopped Plavix.  Restarted Brilinta 90mg  PO BID and Aspirin 81mg  QD.  Dr. Gasper Sells spoke with patient and significant other regarding these changes.

## 2020-09-22 NOTE — Telephone Encounter (Signed)
  Dr Kathlene November would like to speak with Dr Lily Kocher regarding a mutual patient

## 2020-09-23 ENCOUNTER — Telehealth: Payer: Self-pay | Admitting: Internal Medicine

## 2020-09-23 NOTE — Telephone Encounter (Signed)
Eugene [name]  He was seen in hospital. 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 He also had LE weakness. Followup with me only 10/24/20  Plan  - can you set upa telephone visit next 1 week with me or app please - we might need to recheck his CK and see how he is doing. Based on this we might need to refer him for muscle bx  - stil keep 3/29 face to face visit  Thanks  MR

## 2020-09-25 DIAGNOSIS — B961 Klebsiella pneumoniae [K. pneumoniae] as the cause of diseases classified elsewhere: Secondary | ICD-10-CM | POA: Diagnosis not present

## 2020-09-25 DIAGNOSIS — U071 COVID-19: Secondary | ICD-10-CM | POA: Diagnosis not present

## 2020-09-25 DIAGNOSIS — I130 Hypertensive heart and chronic kidney disease with heart failure and stage 1 through stage 4 chronic kidney disease, or unspecified chronic kidney disease: Secondary | ICD-10-CM | POA: Diagnosis not present

## 2020-09-25 DIAGNOSIS — I82401 Acute embolism and thrombosis of unspecified deep veins of right lower extremity: Secondary | ICD-10-CM | POA: Diagnosis not present

## 2020-09-25 DIAGNOSIS — I2510 Atherosclerotic heart disease of native coronary artery without angina pectoris: Secondary | ICD-10-CM | POA: Diagnosis not present

## 2020-09-25 DIAGNOSIS — Z48812 Encounter for surgical aftercare following surgery on the circulatory system: Secondary | ICD-10-CM | POA: Diagnosis not present

## 2020-09-25 DIAGNOSIS — I4901 Ventricular fibrillation: Secondary | ICD-10-CM | POA: Diagnosis not present

## 2020-09-25 DIAGNOSIS — I214 Non-ST elevation (NSTEMI) myocardial infarction: Secondary | ICD-10-CM | POA: Diagnosis not present

## 2020-09-25 DIAGNOSIS — N390 Urinary tract infection, site not specified: Secondary | ICD-10-CM | POA: Diagnosis not present

## 2020-09-25 NOTE — Telephone Encounter (Signed)
Called and spoke with pt letting him know the info stated by MR. Pt verbalized understanding. televisit scheduled for pt March 8 at 10:30 with [facility]. Nothing further needed.

## 2020-09-26 ENCOUNTER — Telehealth: Payer: Self-pay | Admitting: Internal Medicine

## 2020-09-26 NOTE — Telephone Encounter (Signed)
New Message:     Pt says he Is having excessive bleeding from his chest, face and right wrist . Pt was told to call, because he is on blood thinners.

## 2020-09-26 NOTE — Telephone Encounter (Signed)
Spoke with pt and he states that he has a place on his chest that has been there for years but has now dried up and is bleeding.  Has 2-3 "blood spots" on his face but they are unopened.  Skin is dry and has cracked open on right wrist and continues to bleed.  Pt seeing Dr. [name] with Rheumatology tomorrow and going to PCP office.  He states Rheumatology is planning to draw a lot of labs.  Coumadin is controlled by PCP.  States last INR was "high" but he didn't remember the value.  They had him hold Coumadin for a couple of days and then decrease the dose.  Advised pt to make them aware of bleeding issues while he is there tomorrow, especially since INR was off at last check.  Pt verbalized understanding.  Will route to Dr. Gasper Sells to make sure no further recommendations.

## 2020-09-27 DIAGNOSIS — Z79899 Other long term (current) drug therapy: Secondary | ICD-10-CM | POA: Diagnosis not present

## 2020-09-27 DIAGNOSIS — I7300 Raynaud's syndrome without gangrene: Secondary | ICD-10-CM | POA: Diagnosis not present

## 2020-09-27 NOTE — Telephone Encounter (Signed)
Agreed- given recent DVT and cutting balloon intervention, patient would ideal be on Triple therapy until one month post discharge.    If further bleeding with INR 2.5 (+/- 0.5) the next step would be to stop aspirin, though this increase his risk of stent thrombosis.  Werner Lean, MD

## 2020-09-28 DIAGNOSIS — U071 COVID-19: Secondary | ICD-10-CM | POA: Diagnosis not present

## 2020-09-28 DIAGNOSIS — B961 Klebsiella pneumoniae [K. pneumoniae] as the cause of diseases classified elsewhere: Secondary | ICD-10-CM | POA: Diagnosis not present

## 2020-09-28 DIAGNOSIS — Z48812 Encounter for surgical aftercare following surgery on the circulatory system: Secondary | ICD-10-CM | POA: Diagnosis not present

## 2020-09-28 DIAGNOSIS — I214 Non-ST elevation (NSTEMI) myocardial infarction: Secondary | ICD-10-CM | POA: Diagnosis not present

## 2020-09-28 DIAGNOSIS — I130 Hypertensive heart and chronic kidney disease with heart failure and stage 1 through stage 4 chronic kidney disease, or unspecified chronic kidney disease: Secondary | ICD-10-CM | POA: Diagnosis not present

## 2020-09-28 DIAGNOSIS — I82401 Acute embolism and thrombosis of unspecified deep veins of right lower extremity: Secondary | ICD-10-CM | POA: Diagnosis not present

## 2020-09-28 DIAGNOSIS — I2510 Atherosclerotic heart disease of native coronary artery without angina pectoris: Secondary | ICD-10-CM | POA: Diagnosis not present

## 2020-09-28 DIAGNOSIS — N390 Urinary tract infection, site not specified: Secondary | ICD-10-CM | POA: Diagnosis not present

## 2020-09-28 DIAGNOSIS — I4901 Ventricular fibrillation: Secondary | ICD-10-CM | POA: Diagnosis not present

## 2020-09-29 ENCOUNTER — Telehealth: Payer: Self-pay | Admitting: Internal Medicine

## 2020-09-29 DIAGNOSIS — I2510 Atherosclerotic heart disease of native coronary artery without angina pectoris: Secondary | ICD-10-CM | POA: Diagnosis not present

## 2020-09-29 DIAGNOSIS — I130 Hypertensive heart and chronic kidney disease with heart failure and stage 1 through stage 4 chronic kidney disease, or unspecified chronic kidney disease: Secondary | ICD-10-CM | POA: Diagnosis not present

## 2020-09-29 DIAGNOSIS — I4901 Ventricular fibrillation: Secondary | ICD-10-CM | POA: Diagnosis not present

## 2020-09-29 DIAGNOSIS — B961 Klebsiella pneumoniae [K. pneumoniae] as the cause of diseases classified elsewhere: Secondary | ICD-10-CM | POA: Diagnosis not present

## 2020-09-29 DIAGNOSIS — I7300 Raynaud's syndrome without gangrene: Secondary | ICD-10-CM | POA: Diagnosis not present

## 2020-09-29 DIAGNOSIS — I214 Non-ST elevation (NSTEMI) myocardial infarction: Secondary | ICD-10-CM | POA: Diagnosis not present

## 2020-09-29 DIAGNOSIS — N390 Urinary tract infection, site not specified: Secondary | ICD-10-CM | POA: Diagnosis not present

## 2020-09-29 DIAGNOSIS — Z79899 Other long term (current) drug therapy: Secondary | ICD-10-CM | POA: Diagnosis not present

## 2020-09-29 DIAGNOSIS — I82401 Acute embolism and thrombosis of unspecified deep veins of right lower extremity: Secondary | ICD-10-CM | POA: Diagnosis not present

## 2020-09-29 DIAGNOSIS — U071 COVID-19: Secondary | ICD-10-CM | POA: Diagnosis not present

## 2020-09-29 DIAGNOSIS — Z48812 Encounter for surgical aftercare following surgery on the circulatory system: Secondary | ICD-10-CM | POA: Diagnosis not present

## 2020-09-29 NOTE — Telephone Encounter (Signed)
Documentation -patient saw [facility] rheumatology associates Dr. Kathlene November..  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  Muscle biopsy is being considered.  This visit was on 09/21/2020.  We will send the note for scanning but will review the note in patient's visit visits with me in detail.

## 2020-10-02 DIAGNOSIS — B961 Klebsiella pneumoniae [K. pneumoniae] as the cause of diseases classified elsewhere: Secondary | ICD-10-CM | POA: Diagnosis not present

## 2020-10-02 DIAGNOSIS — U071 COVID-19: Secondary | ICD-10-CM | POA: Diagnosis not present

## 2020-10-02 DIAGNOSIS — I130 Hypertensive heart and chronic kidney disease with heart failure and stage 1 through stage 4 chronic kidney disease, or unspecified chronic kidney disease: Secondary | ICD-10-CM | POA: Diagnosis not present

## 2020-10-02 DIAGNOSIS — I2510 Atherosclerotic heart disease of native coronary artery without angina pectoris: Secondary | ICD-10-CM | POA: Diagnosis not present

## 2020-10-02 DIAGNOSIS — Z48812 Encounter for surgical aftercare following surgery on the circulatory system: Secondary | ICD-10-CM | POA: Diagnosis not present

## 2020-10-02 DIAGNOSIS — I214 Non-ST elevation (NSTEMI) myocardial infarction: Secondary | ICD-10-CM | POA: Diagnosis not present

## 2020-10-02 DIAGNOSIS — N390 Urinary tract infection, site not specified: Secondary | ICD-10-CM | POA: Diagnosis not present

## 2020-10-02 DIAGNOSIS — I82401 Acute embolism and thrombosis of unspecified deep veins of right lower extremity: Secondary | ICD-10-CM | POA: Diagnosis not present

## 2020-10-02 DIAGNOSIS — I4901 Ventricular fibrillation: Secondary | ICD-10-CM | POA: Diagnosis not present

## 2020-10-02 NOTE — Progress Notes (Signed)
Patient ID: [name]                 DOB: [birth year]                    MRN: [id number]     HPI: Eugene [name] is a 65 y.o. male patient referred to lipid clinic by Dr. Gasper Sells. 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  Pt admitted to ED on 09/08/20 for leg weakness, difficulty ambulating, and intermittent chest pain. He was found to have COVID-19, DVT, NSTEMI with elevated troponin levels >13K and rhabdomyolysis possibly secondary to rosuvastatin 40 mg (initial CPK 8,423, improved to 932 on discharge 2/19). Rosuvastatin was discontinued, however pt was prescribed atorvastatin 80 mg and steroid taper on discharge 2/19. On 2/25, Dr. Gasper Sells in collaboration with pt's rheumatologist discontinued atorvastatin due to elevated transanimases and possible cause of myalgias, and are considering a muscle biopsy. Pt had previously taken rosuvastatin since 2012 and was subsequently referred to lipid clinic for PCSK9i initiation.   Patient presents today in good spirits ambulating with a walker and accompanied with his wife. Reports not currently taking any lipid-lowering medications since discharge. Reports previous use with atorvastatin ~15 years ago but discontinued due to myalgias. Reports leg muscle biopsy scheduled on March 30th with rheumatology.   Current Medications: none Intolerances: atorvastatin (myalgias), rosuvastatin 40 mg daily (rhabdomyolysis) Risk Factors: progressive ASCVD, T2DM, HTN, CVA LDL goal: < 55mg /dL  Diet: Eats ~1/day -Breakfast: frosted flakes -Lunch: usually  skips -Dinner: sweet potato and bowl of soup -Snacks: none -Drinks: diet coke  Exercise: walking around in the house and outside   Family History: Breast cancer (age of onset: 43) in his mother; Coronary artery disease in his father; Diabetes in his father and sister; Glaucoma in his mother; Heart disease in his brother; Hypertension in his father; Prostate cancer in his brother.  Social History: denies tobacco use  Labs: 09/09/20: LDL 46, TG 211, TC 117, HDL 29 (rosuvastatin 40 mg daily)   09/16/20: AST 70, ALT 155, TP 5.9, TBili 0.8, AlkPhos 57, albumin 2.5 (rosuvastatin 40 mg daily)  CK: 09/08/20 to 09/16/20 (rosuvastatin 40 mg daily) 8,423>>6,921>>5,937>>4,695>>5,022>>4,836>>3319>>2,423>>1,128>>932  Past Medical History:  Diagnosis Date  . Abnormal nuclear stress test 02/19/2018  . Angina, class III ([place]) 02/19/2018  . Anxiety   . Asthma   . CHF (congestive heart failure) ([place])   . Coronary artery disease   . DDD (degenerative disc disease), cervical 03/01/2013  . Diabetes mellitus    type 2  . 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 Dyslipidemia   . Hypertension   . Lumbosacral radiculopathy at L4 02/18/2017  . Myocardial infarction ([place]) 2012  . Obesity   . Sleep apnea 08/17/2013  . Snoring 10/12/2013  . TIA (transient ischemic attack) 08/09/2014  . Tubular adenoma of colon 01/12/2014   transverse colon  . VF (ventricular fibrillation) ([place])    arrest  . Vitamin D deficiency 02/18/2017    Current Outpatient Medications on File Prior to Visit  Medication Sig Dispense Refill  . aspirin EC 81  MG tablet Take 1 tablet (81 mg total) by mouth daily. Swallow whole. 30 tablet 11  . dapagliflozin propanediol (FARXIGA) 10 MG TABS tablet Take 10 mg by mouth at bedtime.    . enoxaparin (LOVENOX) 80 MG/0.8ML injection Inject 0.8 mLs (80 mg total) into the skin daily. 80 mL 10  . furosemide (LASIX) 40 MG tablet Take 1 tablet (40 mg total) by mouth daily. 90 tablet 3  . insulin aspart  (NOVOLOG) 100 UNIT/ML injection Substitute to any brand approved.Before each meal 3 times a day, 140-199 - 2 units, 200-250 - 4 units, 251-299 - 6 units,  300-349 - 8 units,  350 or above 10 units. Dispense syringes and needles as needed, Ok to switch to PEN if approved. DX DM2, Code E11.65 10 mL 0  . insulin glargine (LANTUS) 100 UNIT/ML Solostar Pen Inject 70 Units into the skin daily before breakfast.    . Insulin Syringe-Needle U-100 25G X 1" 1 ML MISC For 4 times a day insulin SQ, 1 month supply. Diagnosis E11.65 30 each 0  . methocarbamol (ROBAXIN) 500 MG tablet Take 500 mg by mouth 2 (two) times daily.    . metoprolol succinate (TOPROL-XL) 50 MG 24 hr tablet TAKE ONE TABLET BY MOUTH ONE TIME DAILY. TAKE WITH OR IMMEDIATELY FOLLOWING A MEAL 90 tablet 1  . nitroGLYCERIN (NITROSTAT) 0.4 MG SL tablet Place 0.4 mg under the tongue every 5 (five) minutes as needed for chest pain.    . pantoprazole (PROTONIX) 40 MG tablet Take 40 mg by mouth daily before breakfast.    . predniSONE (DELTASONE) 5 MG tablet 8 Pills PO for 3 days,6 Pills PO for 3 days,4 Pills PO for 3 days,2 Pills PO for 3 days,1 Pills PO for 3 days,1/2 Pill  PO for 3 days then STOP. 65 tablet 0  . ranolazine (RANEXA) 500 MG 12 hr tablet TAKE ONE TABLET BY MOUTH TWICE DAILY.PLEASE MAKE APPT WITH DR.HARDING FOR REFILLS.  (Patient taking differently: Take 500 mg by mouth 2 (two) times daily.) 60 tablet 2  . Semaglutide, 1 MG/DOSE, (OZEMPIC, 1 MG/DOSE,) 4 MG/3ML SOPN Inject 1 mg into the skin every Tuesday.    . ticagrelor (BRILINTA) 90 MG TABS tablet Take 1 tablet (90 mg total) by mouth 2 (two) times daily. 180 tablet 3  . vitamin B-12 (CYANOCOBALAMIN) 1000 MCG tablet Take 1 tablet (1,000 mcg total) by mouth daily. 30 tablet 0  . warfarin (COUMADIN) 5 MG tablet Take 1 tablet (5 mg total) by mouth daily at 4 PM. 7 tablet 0   No current facility-administered medications on file prior to visit.    No Known Allergies  Assessment/Plan:  1.  Hyperlipidemia - LDL at goal <55 mg/dL given hx of progressive heart disease with recent NSTEMI, T2DM, CVA and PAD. Statin therapy discontinued due to recent diagnosis of rhabdomyolysis possibly secondary to rosuvastatin. Discussed clinical benefits and potential side effects of PCSK9-inhibitors. Demonstrated proper injection technique with teach-back method. Patient amendable to start therapy. PA approved for Repatha 140 mg [place] every 2 weeks with $135 copay cost. Patient confirms copay cost is affordable. Discussed [employer] assistance if cost becomes prohibitive in the future. Patient verbalized understanding. Encouraged patient to aim for a diet full of vegetables, fruit and lean meats (chicken, [place], fish) and to limit carbs (bread, pasta, sugar, rice) and red meat consumption. Encouraged patient to exercise 15-20 minutes daily with the goal of 150 minutes per week. Patient verbalized understanding. Follow-up lipid panel and LFTs  scheduled in 3 months.  Lorel Monaco, PharmD, [place] PGY2 Ambulatory Care Resident [place]

## 2020-10-03 ENCOUNTER — Ambulatory Visit: Payer: Medicare HMO | Admitting: Primary Care

## 2020-10-03 ENCOUNTER — Other Ambulatory Visit: Payer: Self-pay

## 2020-10-03 ENCOUNTER — Telehealth (HOSPITAL_COMMUNITY): Payer: Self-pay

## 2020-10-03 ENCOUNTER — Ambulatory Visit (INDEPENDENT_AMBULATORY_CARE_PROVIDER_SITE_OTHER): Payer: Medicare HMO | Admitting: Pharmacist

## 2020-10-03 DIAGNOSIS — E1169 Type 2 diabetes mellitus with other specified complication: Secondary | ICD-10-CM

## 2020-10-03 DIAGNOSIS — T466X5A Adverse effect of antihyperlipidemic and antiarteriosclerotic drugs, initial encounter: Secondary | ICD-10-CM | POA: Diagnosis not present

## 2020-10-03 DIAGNOSIS — E785 Hyperlipidemia, unspecified: Secondary | ICD-10-CM

## 2020-10-03 DIAGNOSIS — G720 Drug-induced myopathy: Secondary | ICD-10-CM | POA: Diagnosis not present

## 2020-10-03 MED ORDER — REPATHA SURECLICK 140 MG/ML ~~LOC~~ SOAJ: 1.0000 "pen " | SUBCUTANEOUS | 11 refills | Status: DC

## 2020-10-03 NOTE — Patient Instructions (Addendum)
Nice to see you today!  Keep up the good work with diet and exercise. Aim for a diet full of vegetables, fruit and lean meats (chicken, [place], fish). Try to limit carbs (bread, pasta, sugar, rice) and red meat consumption.  Your goal LDL is <55 mg/dL, you're currently at 45 mg/dL  Medication Changes: Will contact you once medication is approved and schedule follow up fasting lipid panel -Repatha 140 mg every 2 weeks or Praluent 75 mg every 2 weeks   Please give [place] a call at [phone] with any questions or concerns.  For PCSK9i, inject once every other week (any day of the week that works for you) into the fatty skin of stomach, upper outer thigh or back of the arm. Clean the site with soap and warm water or an alcohol pad. Keep the medication in the fridge until you are ready to give your dose, then take it out and let warm up to room temperature for 30-60 mins.

## 2020-10-03 NOTE — Telephone Encounter (Signed)
Pt insurance is active and benefits verified through [place] $10, DED 0/0 met, out of pocket $3,900/$130 met, co-insurance 0%. no pre-authorization required. Passport, Delton See, 09/14/2020_0 :19am , REF# [id number]  Will contact patient to see if he is interested in the Cardiac Rehab Program. If interested, patient will need to complete follow up appt. Once completed, patient will be contacted for scheduling upon review by the RN Navigator.

## 2020-10-03 NOTE — Telephone Encounter (Signed)
Called patient to see if he is interested in the Cardiac Rehab Program. Patient expressed interest. Explained scheduling process and went over insurance, patient verbalized understanding. Will contact patient for scheduling once f/u has been completed.  °

## 2020-10-04 DIAGNOSIS — R634 Abnormal weight loss: Secondary | ICD-10-CM | POA: Diagnosis not present

## 2020-10-04 DIAGNOSIS — I82409 Acute embolism and thrombosis of unspecified deep veins of unspecified lower extremity: Secondary | ICD-10-CM | POA: Diagnosis not present

## 2020-10-04 DIAGNOSIS — N1832 Chronic kidney disease, stage 3b: Secondary | ICD-10-CM | POA: Diagnosis not present

## 2020-10-04 DIAGNOSIS — J849 Interstitial pulmonary disease, unspecified: Secondary | ICD-10-CM | POA: Diagnosis not present

## 2020-10-04 DIAGNOSIS — Z13820 Encounter for screening for osteoporosis: Secondary | ICD-10-CM | POA: Diagnosis not present

## 2020-10-04 DIAGNOSIS — G7249 Other inflammatory and immune myopathies, not elsewhere classified: Secondary | ICD-10-CM | POA: Diagnosis not present

## 2020-10-04 DIAGNOSIS — M1990 Unspecified osteoarthritis, unspecified site: Secondary | ICD-10-CM | POA: Diagnosis not present

## 2020-10-04 DIAGNOSIS — M609 Myositis, unspecified: Secondary | ICD-10-CM | POA: Diagnosis not present

## 2020-10-04 DIAGNOSIS — Z79899 Other long term (current) drug therapy: Secondary | ICD-10-CM | POA: Diagnosis not present

## 2020-10-04 DIAGNOSIS — Z7952 Long term (current) use of systemic steroids: Secondary | ICD-10-CM | POA: Diagnosis not present

## 2020-10-04 DIAGNOSIS — I2510 Atherosclerotic heart disease of native coronary artery without angina pectoris: Secondary | ICD-10-CM | POA: Diagnosis not present

## 2020-10-05 DIAGNOSIS — I130 Hypertensive heart and chronic kidney disease with heart failure and stage 1 through stage 4 chronic kidney disease, or unspecified chronic kidney disease: Secondary | ICD-10-CM | POA: Diagnosis not present

## 2020-10-05 DIAGNOSIS — I214 Non-ST elevation (NSTEMI) myocardial infarction: Secondary | ICD-10-CM | POA: Diagnosis not present

## 2020-10-05 DIAGNOSIS — I82401 Acute embolism and thrombosis of unspecified deep veins of right lower extremity: Secondary | ICD-10-CM | POA: Diagnosis not present

## 2020-10-05 DIAGNOSIS — B961 Klebsiella pneumoniae [K. pneumoniae] as the cause of diseases classified elsewhere: Secondary | ICD-10-CM | POA: Diagnosis not present

## 2020-10-05 DIAGNOSIS — U071 COVID-19: Secondary | ICD-10-CM | POA: Diagnosis not present

## 2020-10-05 DIAGNOSIS — N390 Urinary tract infection, site not specified: Secondary | ICD-10-CM | POA: Diagnosis not present

## 2020-10-05 DIAGNOSIS — I2510 Atherosclerotic heart disease of native coronary artery without angina pectoris: Secondary | ICD-10-CM | POA: Diagnosis not present

## 2020-10-05 DIAGNOSIS — Z48812 Encounter for surgical aftercare following surgery on the circulatory system: Secondary | ICD-10-CM | POA: Diagnosis not present

## 2020-10-05 DIAGNOSIS — I4901 Ventricular fibrillation: Secondary | ICD-10-CM | POA: Diagnosis not present

## 2020-10-06 ENCOUNTER — Telehealth: Payer: Self-pay | Admitting: Internal Medicine

## 2020-10-06 NOTE — Telephone Encounter (Signed)
Called and spoke with Mateo Flow. I advised her that MR was unavailable for the next few days but attempted to see if other physician could provide help. She stated that Dr. Kathlene November was calling for a treatment plan but will call back in 2 weeks since it was not urgent.   Nothing further needed at time of call.

## 2020-10-09 DIAGNOSIS — I2510 Atherosclerotic heart disease of native coronary artery without angina pectoris: Secondary | ICD-10-CM | POA: Diagnosis not present

## 2020-10-09 DIAGNOSIS — I130 Hypertensive heart and chronic kidney disease with heart failure and stage 1 through stage 4 chronic kidney disease, or unspecified chronic kidney disease: Secondary | ICD-10-CM | POA: Diagnosis not present

## 2020-10-09 DIAGNOSIS — I214 Non-ST elevation (NSTEMI) myocardial infarction: Secondary | ICD-10-CM | POA: Diagnosis not present

## 2020-10-09 DIAGNOSIS — I4901 Ventricular fibrillation: Secondary | ICD-10-CM | POA: Diagnosis not present

## 2020-10-09 DIAGNOSIS — Z48812 Encounter for surgical aftercare following surgery on the circulatory system: Secondary | ICD-10-CM | POA: Diagnosis not present

## 2020-10-09 DIAGNOSIS — B961 Klebsiella pneumoniae [K. pneumoniae] as the cause of diseases classified elsewhere: Secondary | ICD-10-CM | POA: Diagnosis not present

## 2020-10-09 DIAGNOSIS — N390 Urinary tract infection, site not specified: Secondary | ICD-10-CM | POA: Diagnosis not present

## 2020-10-09 DIAGNOSIS — I82401 Acute embolism and thrombosis of unspecified deep veins of right lower extremity: Secondary | ICD-10-CM | POA: Diagnosis not present

## 2020-10-09 DIAGNOSIS — U071 COVID-19: Secondary | ICD-10-CM | POA: Diagnosis not present

## 2020-10-11 DIAGNOSIS — I130 Hypertensive heart and chronic kidney disease with heart failure and stage 1 through stage 4 chronic kidney disease, or unspecified chronic kidney disease: Secondary | ICD-10-CM | POA: Diagnosis not present

## 2020-10-11 DIAGNOSIS — Z48812 Encounter for surgical aftercare following surgery on the circulatory system: Secondary | ICD-10-CM | POA: Diagnosis not present

## 2020-10-11 DIAGNOSIS — U071 COVID-19: Secondary | ICD-10-CM | POA: Diagnosis not present

## 2020-10-11 DIAGNOSIS — B961 Klebsiella pneumoniae [K. pneumoniae] as the cause of diseases classified elsewhere: Secondary | ICD-10-CM | POA: Diagnosis not present

## 2020-10-11 DIAGNOSIS — I214 Non-ST elevation (NSTEMI) myocardial infarction: Secondary | ICD-10-CM | POA: Diagnosis not present

## 2020-10-11 DIAGNOSIS — N390 Urinary tract infection, site not specified: Secondary | ICD-10-CM | POA: Diagnosis not present

## 2020-10-11 DIAGNOSIS — D6869 Other thrombophilia: Secondary | ICD-10-CM | POA: Diagnosis not present

## 2020-10-11 DIAGNOSIS — I4901 Ventricular fibrillation: Secondary | ICD-10-CM | POA: Diagnosis not present

## 2020-10-11 DIAGNOSIS — Z7901 Long term (current) use of anticoagulants: Secondary | ICD-10-CM | POA: Diagnosis not present

## 2020-10-11 DIAGNOSIS — R29898 Other symptoms and signs involving the musculoskeletal system: Secondary | ICD-10-CM | POA: Diagnosis not present

## 2020-10-11 DIAGNOSIS — I82401 Acute embolism and thrombosis of unspecified deep veins of right lower extremity: Secondary | ICD-10-CM | POA: Diagnosis not present

## 2020-10-11 DIAGNOSIS — I4891 Unspecified atrial fibrillation: Secondary | ICD-10-CM | POA: Diagnosis not present

## 2020-10-11 DIAGNOSIS — I2510 Atherosclerotic heart disease of native coronary artery without angina pectoris: Secondary | ICD-10-CM | POA: Diagnosis not present

## 2020-10-12 DIAGNOSIS — E1159 Type 2 diabetes mellitus with other circulatory complications: Secondary | ICD-10-CM | POA: Diagnosis not present

## 2020-10-12 DIAGNOSIS — Z794 Long term (current) use of insulin: Secondary | ICD-10-CM | POA: Diagnosis not present

## 2020-10-12 DIAGNOSIS — I2510 Atherosclerotic heart disease of native coronary artery without angina pectoris: Secondary | ICD-10-CM | POA: Diagnosis not present

## 2020-10-12 DIAGNOSIS — N184 Chronic kidney disease, stage 4 (severe): Secondary | ICD-10-CM | POA: Diagnosis not present

## 2020-10-12 DIAGNOSIS — I5189 Other ill-defined heart diseases: Secondary | ICD-10-CM | POA: Diagnosis not present

## 2020-10-16 DIAGNOSIS — N390 Urinary tract infection, site not specified: Secondary | ICD-10-CM | POA: Diagnosis not present

## 2020-10-16 DIAGNOSIS — B961 Klebsiella pneumoniae [K. pneumoniae] as the cause of diseases classified elsewhere: Secondary | ICD-10-CM | POA: Diagnosis not present

## 2020-10-16 DIAGNOSIS — I82401 Acute embolism and thrombosis of unspecified deep veins of right lower extremity: Secondary | ICD-10-CM | POA: Diagnosis not present

## 2020-10-16 DIAGNOSIS — I214 Non-ST elevation (NSTEMI) myocardial infarction: Secondary | ICD-10-CM | POA: Diagnosis not present

## 2020-10-16 DIAGNOSIS — I4901 Ventricular fibrillation: Secondary | ICD-10-CM | POA: Diagnosis not present

## 2020-10-16 DIAGNOSIS — I130 Hypertensive heart and chronic kidney disease with heart failure and stage 1 through stage 4 chronic kidney disease, or unspecified chronic kidney disease: Secondary | ICD-10-CM | POA: Diagnosis not present

## 2020-10-16 DIAGNOSIS — U071 COVID-19: Secondary | ICD-10-CM | POA: Diagnosis not present

## 2020-10-16 DIAGNOSIS — Z48812 Encounter for surgical aftercare following surgery on the circulatory system: Secondary | ICD-10-CM | POA: Diagnosis not present

## 2020-10-16 DIAGNOSIS — I2510 Atherosclerotic heart disease of native coronary artery without angina pectoris: Secondary | ICD-10-CM | POA: Diagnosis not present

## 2020-10-17 DIAGNOSIS — N390 Urinary tract infection, site not specified: Secondary | ICD-10-CM | POA: Diagnosis not present

## 2020-10-17 DIAGNOSIS — U071 COVID-19: Secondary | ICD-10-CM | POA: Diagnosis not present

## 2020-10-17 DIAGNOSIS — B961 Klebsiella pneumoniae [K. pneumoniae] as the cause of diseases classified elsewhere: Secondary | ICD-10-CM | POA: Diagnosis not present

## 2020-10-17 DIAGNOSIS — I4901 Ventricular fibrillation: Secondary | ICD-10-CM | POA: Diagnosis not present

## 2020-10-17 DIAGNOSIS — I82401 Acute embolism and thrombosis of unspecified deep veins of right lower extremity: Secondary | ICD-10-CM | POA: Diagnosis not present

## 2020-10-17 DIAGNOSIS — I130 Hypertensive heart and chronic kidney disease with heart failure and stage 1 through stage 4 chronic kidney disease, or unspecified chronic kidney disease: Secondary | ICD-10-CM | POA: Diagnosis not present

## 2020-10-17 DIAGNOSIS — I2510 Atherosclerotic heart disease of native coronary artery without angina pectoris: Secondary | ICD-10-CM | POA: Diagnosis not present

## 2020-10-17 DIAGNOSIS — Z48812 Encounter for surgical aftercare following surgery on the circulatory system: Secondary | ICD-10-CM | POA: Diagnosis not present

## 2020-10-17 DIAGNOSIS — I214 Non-ST elevation (NSTEMI) myocardial infarction: Secondary | ICD-10-CM | POA: Diagnosis not present

## 2020-10-19 ENCOUNTER — Telehealth: Payer: Self-pay

## 2020-10-19 DIAGNOSIS — I82401 Acute embolism and thrombosis of unspecified deep veins of right lower extremity: Secondary | ICD-10-CM | POA: Diagnosis not present

## 2020-10-19 DIAGNOSIS — I214 Non-ST elevation (NSTEMI) myocardial infarction: Secondary | ICD-10-CM | POA: Diagnosis not present

## 2020-10-19 DIAGNOSIS — M609 Myositis, unspecified: Secondary | ICD-10-CM | POA: Diagnosis not present

## 2020-10-19 DIAGNOSIS — I82409 Acute embolism and thrombosis of unspecified deep veins of unspecified lower extremity: Secondary | ICD-10-CM | POA: Diagnosis not present

## 2020-10-19 DIAGNOSIS — J849 Interstitial pulmonary disease, unspecified: Secondary | ICD-10-CM | POA: Diagnosis not present

## 2020-10-19 DIAGNOSIS — N390 Urinary tract infection, site not specified: Secondary | ICD-10-CM | POA: Diagnosis not present

## 2020-10-19 DIAGNOSIS — I7300 Raynaud's syndrome without gangrene: Secondary | ICD-10-CM | POA: Diagnosis not present

## 2020-10-19 DIAGNOSIS — Z79899 Other long term (current) drug therapy: Secondary | ICD-10-CM | POA: Diagnosis not present

## 2020-10-19 DIAGNOSIS — U071 COVID-19: Secondary | ICD-10-CM | POA: Diagnosis not present

## 2020-10-19 DIAGNOSIS — I130 Hypertensive heart and chronic kidney disease with heart failure and stage 1 through stage 4 chronic kidney disease, or unspecified chronic kidney disease: Secondary | ICD-10-CM | POA: Diagnosis not present

## 2020-10-19 DIAGNOSIS — B961 Klebsiella pneumoniae [K. pneumoniae] as the cause of diseases classified elsewhere: Secondary | ICD-10-CM | POA: Diagnosis not present

## 2020-10-19 DIAGNOSIS — Z48812 Encounter for surgical aftercare following surgery on the circulatory system: Secondary | ICD-10-CM | POA: Diagnosis not present

## 2020-10-19 DIAGNOSIS — I2510 Atherosclerotic heart disease of native coronary artery without angina pectoris: Secondary | ICD-10-CM | POA: Diagnosis not present

## 2020-10-19 DIAGNOSIS — M1990 Unspecified osteoarthritis, unspecified site: Secondary | ICD-10-CM | POA: Diagnosis not present

## 2020-10-19 DIAGNOSIS — N1832 Chronic kidney disease, stage 3b: Secondary | ICD-10-CM | POA: Diagnosis not present

## 2020-10-19 DIAGNOSIS — I4901 Ventricular fibrillation: Secondary | ICD-10-CM | POA: Diagnosis not present

## 2020-10-19 DIAGNOSIS — G7249 Other inflammatory and immune myopathies, not elsewhere classified: Secondary | ICD-10-CM | POA: Diagnosis not present

## 2020-10-19 NOTE — Telephone Encounter (Signed)
-----   Message from Werner Lean, MD sent at 10/15/2020 12:26 PM EDT ----- Regarding: Checking in [place] Dr. [name],  We had planned on checking in about one month after Mr. Cadenas's stent.  We will be bring him back to Warfarin and Ticagrelor.  Ideally, we would still keep him on this regimen for at least 3 months, before we start pausing it, but if he must get it for the biopsy we will find a way to work with it.  Happy to discussed in further.  Best,  [employer]

## 2020-10-19 NOTE — Telephone Encounter (Signed)
Spoke with patient in regards to upcoming muscle biopsy.  He expressed that he was not instructed to hold blood thinners.  Dr. Gasper Sells requested that pt stop taking Aspirin.  Patient is aware and will stop taking med.  Medication has been removed from med list.

## 2020-10-24 ENCOUNTER — Ambulatory Visit: Payer: Medicare HMO | Admitting: Internal Medicine

## 2020-10-24 ENCOUNTER — Encounter: Payer: Self-pay | Admitting: Internal Medicine

## 2020-10-24 ENCOUNTER — Other Ambulatory Visit: Payer: Self-pay

## 2020-10-24 VITALS — BP 100/68 | HR 89 | Temp 97.2°F | Ht 66.0 in | Wt 167.1 lb

## 2020-10-24 DIAGNOSIS — R748 Abnormal levels of other serum enzymes: Secondary | ICD-10-CM

## 2020-10-24 DIAGNOSIS — Z79899 Other long term (current) drug therapy: Secondary | ICD-10-CM | POA: Diagnosis not present

## 2020-10-24 DIAGNOSIS — J849 Interstitial pulmonary disease, unspecified: Secondary | ICD-10-CM | POA: Diagnosis not present

## 2020-10-24 NOTE — Addendum Note (Signed)
Addended by: Suzzanne Cloud E on: 10/24/2020 12:22 PM   Modules accepted: Orders

## 2020-10-24 NOTE — Patient Instructions (Addendum)
ICD-10-CM   1. ILD (interstitial lung disease) ([place])  J84.9   2. Elevated CK  R74.8   3. High risk medication use  Z79.899    Patient history it appears that you have interstitial lung disease otherwise call pulmonary fibrosis.  The basis for this might be autoimmune and related to your myopathy   I was able to review rheumatology notes from October 04, 2020  - Glad you are seeing rheumatology Dr Kathlene November and after starting prednisone you are able to walk again  - Glad you are having muscle biopsy tomorrow   -He is still waiting on some lab results  Plan -Check blood CK level today and blood G6PD safety lab for cellcept  -I will try to touch base with Dr. Kathlene November but I support his treatment plan to start CellCept -Do spirometry and DLCO in 3 months  Follow-up -Return to see [name] 30-minute visit slot in 3 months or sooner if needed

## 2020-10-24 NOTE — Addendum Note (Signed)
Addended by: Elie Confer on: 10/24/2020 12:11 PM   Modules accepted: Orders

## 2020-10-24 NOTE — Progress Notes (Signed)
From triad Eugene [name] a 65 y.o.malewith medical history significant forcoronary artery disease with a four-vessel CABG in 2006, type 2 diabetes,CKD 4,history of V. fib arrest status post ICD insertion who presentswith 1 week of progressive lower extremity weakness intermittent substernal centralized chest pain.Reports that a week ago he had some numbness in his right leg that progressed to weakness. For the last few days he has been able to walk around with the help of a cart that he wheels around at work. Today the weakness became more profound and he was unable to even stand up.He has also had intermittent chest pain and pressure over the last week. He used nitroglycerin when he had chest pain and chest pain would resolve. States he last had chest pain approximately 24 hours ago.He reports he is also had unintended weight loss over the last few months of approximately 60 pounds. He also reports that he has been having difficulty with urination with a weak stream and occasional intermittent dysuria. He has been referred to gastroenterology for further work-up of his unintended weight loss.Dates he has not had any fever or chills. He denies any cough. He has had no known exposure to Covid. Lives with his fiance. He denies tobacco, alcohol use  To CCM  - has incidental covid on admit. Not on O2. Says last 24-48h unable to stand but can move his legs. He built a ramp to walk in his house x fe wmonths. Increased dyspnea x few weeks. 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   Appears to have NSTEMI with high CK and trop. On antiocagulation   ILD QUESTIONNAIRE      [name] Integrated Comprehensive ILD Questionnaire  Symptoms:   - dysnea x 14 days. Episodiec. Associateied leg weakness +. Denies cough  SYMPTOM SCALE - ILD 09/10/2020   O2 use ra  Shortness of Breath 0 -> 5 scale with 5 being worst (score  6 If unable to do)  At rest 0  Simple tasks - showers, clothes change, eating, shaving 0  Household (dishes, doing bed, laundry) 0  Shopping 1  Walking level at own pace 1  Walking up Stairs 2  Total (30-36) Dyspnea Score 4  How bad is your cough? 0  How bad is your fatigue 2  How bad is nausea 1  How bad is vomiting?  0  How bad is diarrhea? 0  How bad is anxiety? 0  How bad is depression 0        Past Medical History :     has a past medical history of Abnormal nuclear stress test (02/19/2018), Angina, class III ([name]) (02/19/2018), Anxiety, Asthma, CHF (congestive heart failure) ([name]), Coronary artery disease, DDD (degenerative disc disease), cervical (03/01/2013), Diabetes mellitus, Diverticular disease (01/12/14), Dyslipidemia, Hypertension, Lumbosacral radiculopathy at L4 (02/18/2017), Myocardial infarction ([name]) (2012), Obesity, Sleep apnea (08/17/2013), Snoring (10/12/2013), TIA (transient ischemic attack) (08/09/2014), Tubular adenoma of colon (01/12/2014), VF (ventricular fibrillation) ([name]), and Vitamin D deficiency (02/18/2017).  Denies ashtma, copd, collagen vasc disease, vasculitis, pleuris   Currently has incidental covdi  ROS:  Does report color change in fingers  ? RAYNAID, 50# weight loss + Fatigue +   FAMILY HISTORY of LUNG DISEASE:  denies   EXPOSURE HISTORY:  negative   HOME and HOBBY DETAILS :  [facility] family home for 10 years. Age of home 24 years. Otherwise all negative   OCCUPATIONAL HISTORY (122 questions) :  negaive   PULMONARY TOXICITY HISTORY (27 items): negative   LABS CK profile  - Results for Eugene [name], Eugene [name] (MRN [id number]) as of 09/10/2020 18:54  Ref. Range 09/06/2010 00:00 09/06/2010 14:55 09/07/2010 08:00 09/08/2010 03:30 09/08/2020 21:21 09/09/2020 04:09 09/09/2020 16:43 09/10/2020 07:21  CK Total Latest Ref Range: 49 - 397 U/L 279 (H) 86 267 (H) 136 8,423 (H) 6,921 (H) 3,499 (H) 5,937 (H)   Results for Eugene [name], Eugene [name] (MRN  [id number]) as of 09/10/2020 18:54  Ref. Range 09/08/2020 18:23 09/08/2020 21:21 09/09/2020 04:09  Troponin I (High Sensitivity) Latest Ref Range: <18 ng/L 10,598 [facility]) 13,971 (HH) 6,373 (HH)      09/10/2020 - no change. On RA. Still unable to move lowers well   Results for Eugene [name], Eugene [name] (MRN [id number]) as of 10/24/2020 11:30  Ref. Range 09/09/2020 16:43 09/11/2020 04:39  Acetylchol Block Ab Latest Ref Range: 0 - 25 %  15  Acety choline binding ab Latest Ref Range: 0.00 - 0.24 nmol/L  <0.03  Acetylcholine Modulat Ab Latest Units: %  CANRGT  ANA Ab, IFA Unknown Negative   ANCA Proteinase 3 Latest Ref Range: 0.0 - 3.5 U/mL <3.5   Anti JO-1 Latest Ref Range: 0.0 - 0.9 AI <0.2   CCP Antibodies IgG/IgA Latest Ref Range: 0 - 19 units 3   ds DNA Ab Latest Ref Range: 0 - 9 IU/mL 55 (H)   GBM Ab Latest Ref Range: 0 - 20 units 3   Myeloperoxidase Abs Latest Ref Range: 0.0 - 9.0 U/mL <9.0   RA Latex Turbid. Latest Ref Range: <14.0 IU/mL <10.0   ENA SM Ab Ser-aCnc Latest Ref Range: 0.0 - 0.9 AI <0.2   SSA (Ro) (ENA) Antibody, IgG Latest Ref Range: 0.0 - 0.9 AI <0.2   SSB (La) (ENA) Antibody, IgG Latest Ref Range: 0.0 - 0.9 AI <0.2   Scleroderma (Scl-70) (ENA) Antibody, IgG Latest Ref Range: 0.0 - 0.9 AI <0.2   IMPRESSION: 1. 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 Suggest outpatient pulmonology consultation and follow-up high-resolution chest CT study in 6-12 months. Findings are categorized as probable UIP per consensus guidelines: Diagnosis of Idiopathic Pulmonary Fibrosis: An Official ATS/ERS/JRS/ALAT Clinical Practice Guideline. [place], Iss 5, [phone], Mar 29 2017. 2. Solid 5 mm right middle lobe pulmonary nodule along the minor fissure. Follow-up noncontrast chest CT recommended in 12 months in this high risk patient. This recommendation follows the consensus statement: Guidelines for  Management of Incidental Pulmonary Nodules Detected on CT Images: From the Fleischner Society 2017; Radiology 2017; 284:228-243. 3. Cholelithiasis. 4. Aortic Atherosclerosis (ICD10-I70.0) and Emphysema (ICD10-J43.9).   Electronically Signed   By: Ilona Sorrel M.D  OV 10/24/2020  Subjective:  Patient ID: Eugene [name], male , DOB: 1955-11-27 , age 69 y.o. , MRN: [id number] , ADDRESS: [address] [place] [address] PCP [name], MD Patient Care Team: [name], MD as PCP - General (Endocrinology) [name], MD as PCP - Cardiology (Cardiology)  This Provider for this visit: Treatment Team:  Attending Provider: Brand Males, MD    10/24/2020 -   Chief Complaint  Patient presents with  . Follow-up    ?ILD,  pt stated that he feels that he has a panic attack, usually early in the morning and it usually calms down as the day goes on.  SHOB with activity at times.    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 -presents for outpatient follow-up.  After his inpatient stay.  I saw him in February 2022 when he had incidental COVID.  At the time he had interstitial lung disease new diagnosis.  He was having paraparesis and elevated CK.  There was confusion with elevated CK was due to MI.  Since then he is seen rheumatology the diagnosis of myositis.  He has been started on prednisone for this.  Most recent rheumatology visit was October 04, 2020.  After starting prednisone he is now able to walk he still has some weakness in his legs but is improved.  Muscle biopsy is pending tomorrow.  I was able to review rheumatology notes.  His shortness of breath is better.  Consideration is being given for CellCept which I totally support.  He is not on oxygen at home.  His symptom scores and walking desaturation test today is noted below.    SYMPTOM SCALE - ILD 09/10/2020   10/24/2020   O2 use ra ra  Shortness of Breath 0 -> 5 scale with 5 being worst (score 6 If unable to do)   At rest 0 1  Simple tasks - showers, clothes change, eating, shaving 0 1  Household (dishes, doing bed, laundry) 0 1  Shopping 1 1  Walking level at own pace 1 2  Walking up Stairs 2 3  Total (30-36) Dyspnea Score 4 9  How bad is your cough? 0 0  How bad is your fatigue 2 1  How bad is nausea 1 0  How bad is vomiting?  0 0  How bad is diarrhea? 0 0  How bad is anxiety? 0 2  How bad is depression 0 0    Simple office walk 185 feet x  3 laps goal with forehead probe 10/24/2020   O2 used ra  Number laps completed 3 but stopped at 2nd for 13 sec  Comments about pace x  Resting Pulse Ox/HR 96% and 98/min  Final Pulse Ox/HR 94% and 122/min  Desaturated </= 88% no  Desaturated <= 3% points no  Got Tachycardic >/= 90/min yes  Symptoms at end of test Dyspnea at 2nc  Miscellaneous comments Resumed 3dd after stopbut pulse ox/HR did not change      PFT  No flowsheet data found.     has a past medical history of Abnormal nuclear stress test (02/19/2018), Angina, class III ([place]) (02/19/2018), Anxiety, Asthma, CHF (congestive heart failure) ([place]), Coronary artery disease, DDD (degenerative disc disease), cervical (03/01/2013), Diabetes mellitus, Diverticular disease (01/12/14), Dyslipidemia, Hypertension, Lumbosacral radiculopathy at L4 (02/18/2017), Myocardial infarction ([place]) (2012), Obesity, Sleep apnea (08/17/2013), Snoring (10/12/2013), TIA (transient ischemic attack) (08/09/2014), Tubular adenoma of colon (01/12/2014), VF (ventricular fibrillation) ([place]), and Vitamin D deficiency (02/18/2017).   reports that he has never smoked. He has never used smokeless tobacco.  Past Surgical History:  Procedure Laterality Date  . ABDOMINAL AORTAGRAM N/A 12/14/2013   Procedure: ABDOMINAL Maxcine Ham;  Surgeon: [name], MD;  Location: [facility] C[facility] LAB;  Service: Cardiovascular;  Laterality: N/A;   . CARDIAC CATHETERIZATION  09/16/10  . COLONOSCOPY  01/12/2014  . CORONARY ARTERY BYPASS GRAFT  2006  . CORONARY BALLOON ANGIOPLASTY N/A 09/12/2020   Procedure: CORONARY BALLOON ANGIOPLASTY;  Surgeon: Troy Sine, MD;  Location: [place] CV LAB;  Service: Cardiovascular;  Laterality: N/A;  . CORONARY STENT INTERVENTION  07/06/2020  . CORONARY STENT  INTERVENTION N/A 07/06/2020   Procedure: CORONARY STENT INTERVENTION;  Surgeon: Nelva Bush, MD;  Location: [place] CV LAB;  Service: Cardiovascular;  Laterality: N/A;  . IMPLANTABLE CARDIOVERTER DEFIBRILLATOR IMPLANT  2012   STJ ICD- single chamber  . LEFT HEART CATH AND CORS/GRAFTS ANGIOGRAPHY N/A 02/19/2018   Procedure: LEFT HEART CATH AND CORS/GRAFTS ANGIOGRAPHY;  Surgeon: [name], MD;  Location: [place] CV LAB;  Service: Cardiovascular;  Laterality: N/A;  . LEFT HEART CATH AND CORS/GRAFTS ANGIOGRAPHY N/A 07/06/2020   Procedure: LEFT HEART CATH AND CORS/GRAFTS ANGIOGRAPHY;  Surgeon: Nelva Bush, MD;  Location: [place] CV LAB;  Service: Cardiovascular;  Laterality: N/A;  . LEFT HEART CATH AND CORS/GRAFTS ANGIOGRAPHY N/A 09/12/2020   Procedure: LEFT HEART CATH AND CORS/GRAFTS ANGIOGRAPHY;  Surgeon: Troy Sine, MD;  Location: [place] CV LAB;  Service: Cardiovascular;  Laterality: N/A;  . LOOP RECORDER IMPLANT N/A 08/24/2014   Procedure: LOOP RECORDER IMPLANT;  Surgeon: [name], MD;  Location: [facility] CATH LAB;  Service: Cardiovascular;  Laterality: N/A;  . MASS EXCISION  2008   right shoulder  . STERNECTOMY      No Known Allergies  Immunization History  Administered Date(s) Administered  . Influenza, Seasonal, Injecte, Preservative Fre 07/21/2013  . Influenza,inj,Quad PF,6+ Mos 04/26/2015, 05/14/2016, 05/27/2017, 04/13/2018  . Pneumococcal Polysaccharide-23 07/21/2013  . Zoster Recombinat (Shingrix) 05/18/2019    Family History  Problem Relation Age of Onset  . Coronary artery disease Father         early 76's  . Diabetes Father   . Hypertension Father   . Breast cancer Mother 39       mets to colon and other  . Glaucoma Mother   . Prostate cancer Brother   . Heart disease Brother   . Diabetes Sister      Current Outpatient Medications:  .  dapagliflozin propanediol (FARXIGA) 10 MG TABS tablet, Take 10 mg by mouth at bedtime., Disp: , Rfl:  .  Evolocumab (REPATHA SURECLICK) 440 MG/ML SOAJ, Inject 1 pen into the skin every 14 (fourteen) days., Disp: 2 mL, Rfl: 11 .  furosemide (LASIX) 40 MG tablet, Take 1 tablet (40 mg total) by mouth daily., Disp: 90 tablet, Rfl: 3 .  insulin aspart (NOVOLOG) 100 UNIT/ML injection, Substitute to any brand approved.Before each meal 3 times a day, 140-199 - 2 units, 200-250 - 4 units, 251-299 - 6 units,  300-349 - 8 units,  350 or above 10 units. Dispense syringes and needles as needed, Ok to switch to PEN if approved. DX DM2, Code E11.65, Disp: 10 mL, Rfl: 0 .  insulin glargine (LANTUS) 100 UNIT/ML Solostar Pen, Inject 70 Units into the skin daily before breakfast., Disp: , Rfl:  .  Insulin Syringe-Needle U-100 25G X 1" 1 ML MISC, For 4 times a day insulin SQ, 1 month supply. Diagnosis E11.65, Disp: 30 each, Rfl: 0 .  methocarbamol (ROBAXIN) 500 MG tablet, Take 500 mg by mouth 2 (two) times daily., Disp: , Rfl:  .  metoprolol succinate (TOPROL-XL) 50 MG 24 hr tablet, TAKE ONE TABLET BY MOUTH ONE TIME DAILY. TAKE WITH OR IMMEDIATELY FOLLOWING A MEAL, Disp: 90 tablet, Rfl: 1 .  nitroGLYCERIN (NITROSTAT) 0.4 MG SL tablet, Place 0.4 mg under the tongue every 5 (five) minutes as needed for chest pain., Disp: , Rfl:  .  pantoprazole (PROTONIX) 40 MG tablet, Take 40 mg by mouth daily before breakfast., Disp: , Rfl:  .  predniSONE (DELTASONE) 5 MG  tablet, 8 Pills PO for 3 days,6 Pills PO for 3 days,4 Pills PO for 3 days,2 Pills PO for 3 days,1 Pills PO for 3 days,1/2 Pill  PO for 3 days then STOP., Disp: 65 tablet, Rfl: 0 .  ranolazine (RANEXA) 500 MG 12  hr tablet, TAKE ONE TABLET BY MOUTH TWICE DAILY.PLEASE MAKE APPT WITH DR.HARDING FOR REFILLS.  (Patient taking differently: Take 500 mg by mouth 2 (two) times daily.), Disp: 60 tablet, Rfl: 2 .  Semaglutide, 1 MG/DOSE, (OZEMPIC, 1 MG/DOSE,) 4 MG/3ML SOPN, Inject 1 mg into the skin every Tuesday., Disp: , Rfl:  .  ticagrelor (BRILINTA) 90 MG TABS tablet, Take 1 tablet (90 mg total) by mouth 2 (two) times daily., Disp: 180 tablet, Rfl: 3 .  vitamin B-12 (CYANOCOBALAMIN) 1000 MCG tablet, Take 1 tablet (1,000 mcg total) by mouth daily., Disp: 30 tablet, Rfl: 0 .  warfarin (COUMADIN) 5 MG tablet, Take 1 tablet (5 mg total) by mouth daily at 4 PM., Disp: 7 tablet, Rfl: 0      Objective:   Vitals:   10/24/20 1100  BP: 100/68  Pulse: 89  Temp: (!) 97.2 F (36.2 C)  TempSrc: Tympanic  SpO2: 97%  Weight: 167 lb 2 oz (75.8 kg)  Height: 5\' 6"  (1.676 m)    Estimated body mass index is 26.97 kg/m as calculated from the following:   Height as of this encounter: 5\' 6"  (1.676 m).   Weight as of this encounter: 167 lb 2 oz (75.8 kg).  @WEIGHTCHANGE @  [employer]   10/24/20 1100  Weight: 167 lb 2 oz (75.8 kg)     Physical Exam   General: No distress. Looks bettr Neuro: Alert and Oriented x 3. GCS 15. Speech normal Psych: Pleasant Resp:  Barrel Chest - no.  Wheeze - no, Crackles - yes, No overt respiratory distress CVS: Normal heart sounds. Murmurs - no Ext: Stigmata of Connective Tissue Disease - no HEENT: Normal upper airway. PEERL +. No post nasal drip   Cushingoid Lowers look lean     Assessment:       ICD-10-CM   1. ILD (interstitial lung disease) ([place])  J84.9   2. Elevated CK  R74.8   3. High risk medication use  Z79.899        Plan:     Patient Instructions     ICD-10-CM   1. ILD (interstitial lung disease) ([place])  J84.9   2. Elevated CK  R74.8   3. High risk medication use  Z79.899    Patient history it appears that you have interstitial lung disease otherwise  call pulmonary fibrosis.  The basis for this might be autoimmune and related to your myopathy   I was able to review rheumatology notes from October 04, 2020  - Glad you are seeing rheumatology Dr Kathlene November and after starting prednisone you are able to walk again  - Glad you are having muscle biopsy tomorrow   -He is still waiting on some lab results  Plan -Check blood CK level today and blood G6PD safety lab for cellcept  -I will try to touch base with Dr. Kathlene November but I support his treatment plan to start CellCept -Do spirometry and DLCO in 3 months  Follow-up -Return to see [name] 30-minute visit slot in 3 months or sooner if needed      SIGNATURE    Dr. Brand Males, M.D., F.C.C.P,  Pulmonary and Critical Care Medicine Staff Physician, [facility] Director -  Interstitial Lung Disease  Program  Pulmonary [place] at [place], [place], [address]  Pager: [phone], If no answer or between  15:00h - 7:00h: call [phone] Telephone: [phone]  12:03 PM 10/24/2020

## 2020-10-25 ENCOUNTER — Ambulatory Visit: Payer: Self-pay | Admitting: General Surgery

## 2020-10-25 ENCOUNTER — Encounter: Payer: Self-pay | Admitting: Internal Medicine

## 2020-10-25 DIAGNOSIS — M609 Myositis, unspecified: Secondary | ICD-10-CM | POA: Diagnosis not present

## 2020-10-25 LAB — GLUCOSE 6 PHOSPHATE DEHYDROGENASE: G-6PDH: 21.5 U/g Hgb — ABNORMAL HIGH (ref 7.0–20.5)

## 2020-10-25 LAB — CK: Total CK: 60 U/L (ref 41–331)

## 2020-10-25 NOTE — H&P (View-Only) (Signed)
[name] Appointment: 10/25/2020 11:10 AM Location: [facility] Patient #: [id number] DOB: 08-01-1955 Single / Language: [name] / Race: White Male   History of Present Illness Lavone Neri E. [name] MD; 10/25/2020 10:39 AM) The patient is a 65 year old male who presents with myopathy. 65yo M I was asked to see in consultation by Dr. [name] regarding a muscle biopsy. 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 He was emergently hospitalized, unable to walk. He is also had some pulmonary issues which the pulmonary team is following closely. He has regained some strength but needs a muscle biopsy to diagnose his myositis.   Past Surgical History Janeann Forehand, CNA; 10/25/2020 10:27 AM) Cataract Surgery  Bilateral.  Diagnostic Studies History Janeann Forehand, CNA; 10/25/2020 10:27 AM) Colonoscopy  1-5 years ago  Allergies Janeann Forehand, CNA; 10/25/2020 10:30 AM) No Known Drug Allergies  [10/25/2020]: Allergies Reconciled   Medication History Janeann Forehand, CNA; 10/25/2020 10:30 AM) Accu-Chek Guide (In Vitro) Active. Accu-Chek Softclix Lancets Active. Atorvastatin Calcium (80MG  Tablet, Oral) Active. BD Insulin Syringe U/F (31G X 5/16"0.3 ML Misc,) Active. Brilinta (90MG  Tablet, Oral) Active. Clopidogrel Bisulfate (75MG  Tablet, Oral) Active. Farxiga (10MG  Tablet, Oral) Active. Furosemide (20MG  Tablet, Oral) Active. Jantoven (5MG  Tablet, Oral) Active. Methocarbamol (500MG  Tablet, Oral) Active. Mycophenolate Mofetil (500MG  Tablet, Oral) Active. NovoLOG (100UNIT/ML Solution, Subcutaneous) Active. Ondansetron HCl (4MG  Tablet, Oral) Active. Ozempic (1 MG/DOSE) (4MG /3ML Soln Pen-inj, Subcutaneous) Active. Pantoprazole Sodium (40MG  Tablet DR, Oral) Active. predniSONE (10MG  Tablet, Oral) Active. predniSONE (20MG  Tablet, Oral) Active. predniSONE (5MG  Tablet, Oral) Active. predniSONE (50MG  Tablet, Oral) Active. Ranolazine  ER (500MG  Tablet ER 12HR, Oral) Active. Repatha SureClick (140MG /ML Soln Auto-inj, Subcutaneous) Active. Rosuvastatin Calcium (40MG  Tablet, Oral) Active. Medications Reconciled  Social History Janeann Forehand, CNA; 10/25/2020 10:27 AM) Caffeine use  Carbonated beverages, Tea. No alcohol use  No drug use  Tobacco use  Never smoker.  Family History Janeann Forehand, CNA; 10/25/2020 10:27 AM) Breast Cancer  Mother. Colon Cancer  Mother. Colon Polyps  Mother. Heart Disease  Brother. Prostate Cancer  Brother.  Other Problems Janeann Forehand, CNA; 10/25/2020 10:27 AM) Cerebrovascular Accident  Chest pain  Congestive Heart Failure  Gastroesophageal Reflux Disease  Hypercholesterolemia  Myocardial infarction     Review of Systems [facility] CNA; 10/25/2020 10:27 AM) General Not Present- Appetite Loss, Chills, Fatigue, Fever, Night Sweats, Weight Gain and Weight Loss. Skin Present- Dryness and Non-Healing Wounds. Not Present- Change in Wart/Mole, Hives, Jaundice, New Lesions, Rash and Ulcer. HEENT Present- Wears glasses/contact lenses. Not Present- Earache, Hearing Loss, Hoarseness, Nose Bleed, Oral Ulcers, Ringing in the Ears, Seasonal Allergies, Sinus Pain, Sore Throat, Visual Disturbances and Yellow Eyes. Respiratory Not Present- Bloody sputum, Chronic Cough, Difficulty Breathing, Snoring and Wheezing. Breast Not Present- Breast Mass, Breast Pain, Nipple Discharge and Skin Changes. Cardiovascular Present- Swelling of Extremities. Not Present- Chest Pain, Difficulty Breathing Lying Down, Leg Cramps, Palpitations, Rapid Heart Rate and Shortness of Breath. Gastrointestinal Not Present- Abdominal Pain, Bloating, Bloody Stool, Change in Bowel Habits, Chronic diarrhea, Constipation, Difficulty Swallowing, Excessive gas, Gets full quickly at meals, Hemorrhoids, Indigestion, Nausea, Rectal Pain and Vomiting. Male Genitourinary Present- Frequency. Not Present- Blood in  Urine, Change in Urinary Stream, Impotence, Nocturia, Painful Urination, Urgency and Urine Leakage. Musculoskeletal Present- Muscle Weakness. Not Present- Back Pain, Joint Pain, Joint Stiffness, Muscle Pain and Swelling of Extremities. Neurological Present- Trouble walking and Weakness. Not Present- Decreased Memory, Fainting, Headaches, Numbness, Seizures, Tingling and Tremor. Psychiatric Not Present- Anxiety, Bipolar, Change in Sleep  Pattern, Depression, Fearful and Frequent crying. Endocrine Not Present- Cold Intolerance, Excessive Hunger, Hair Changes, Heat Intolerance, Hot flashes and New Diabetes. Hematology Not Present- Blood Thinners, Easy Bruising, Excessive bleeding, Gland problems, HIV and Persistent Infections.  Vitals Adriana Reams [place] CNA; 10/25/2020 10:31 AM) 10/25/2020 10:30 AM Weight: 164.5 lb Height: 66in Body Surface Area: 1.84 m Body Mass Index: 26.55 kg/m  Temp.: 97.25F  Pulse: 98 (Regular)  P.OX: 94% (Room air) BP: 140/80(Sitting, Left Arm, Standard)       Physical Exam Lavone Neri E. [name] MD; 10/25/2020 10:40 AM) General Mental Status-Alert. General Appearance-Consistent with stated age. Hydration-Well hydrated. Voice-Normal.  Head and Neck Head-normocephalic, atraumatic with no lesions or palpable masses.  Eye Sclera/Conjunctiva - Bilateral-No scleral icterus.  Chest and Lung Exam Chest and lung exam reveals -quiet, even and easy respiratory effort with no use of accessory muscles. Inspection Chest Wall - Normal. Back - normal.  Breast - Did not examine.  Cardiovascular Cardiovascular examination reveals -normal pedal pulses bilaterally. Note: regular rate and rhythm  Abdomen Inspection-Inspection Normal. Palpation/Percussion Palpation and Percussion of the abdomen reveal - Soft, Non Tender, No Rebound tenderness, No Rigidity (guarding) and No hepatosplenomegaly.  Peripheral Vascular Upper Extremity Inspection -  Bilateral - Normal - No Clubbing, No Cyanosis, No Edema, Pulses Intact. Lower Extremity Palpation - Edema - Bilateral - No edema - Bilateral.  Neurologic Neurologic evaluation reveals -alert and oriented x 3 with no impairment of recent or remote memory. Mental Status-Normal.  Musculoskeletal Global Assessment -Note: no gross deformities No tenderness on examination of muscle bodies, does have proximal muscular weakness upper and lower extremities.   Lymphatic Head & Neck  General Head & Neck Lymphatics: Bilateral - Description - Normal. Axillary  General Axillary Region: Bilateral - Description - Normal. Tenderness - Non Tender.    Assessment & Plan Lavone Neri E. 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 This will allow a detailed pathologic examination for further delineation of the cause of his myositis. His family member is having a knee replacement and a couple of weeks so I will try and schedule this urgently next week, both because it isn't urgent medical issue and to ensure some recovery prior to him assisting her after her surgery. I described the procedure, risks, and benefits of thigh muscle biopsy with him and answered his questions. I discussed the expected postoperative course.  He will hold his warfarin 2d before. He may continue the Brylinta and Plavix.

## 2020-10-25 NOTE — Progress Notes (Signed)
Appointment scheduled Fri 01-12-21 at 10:30am w/Dr Carlean Purl.

## 2020-10-25 NOTE — H&P (Signed)
Eugene [name] Appointment: 10/25/2020 11:10 AM Location: [facility] Patient #: [id number] DOB: 09-02-1955 Single / Language: Eugene [name] / Race: White Male   History of Present Illness Eugene Neri E. [name] MD; 10/25/2020 10:39 AM) The patient is a 65 year old male who presents with myopathy. 65yo M I was asked to see in consultation by Dr. [name] regarding a muscle biopsy. 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 He was emergently hospitalized, unable to walk. He is also had some pulmonary issues which the pulmonary team is following closely. He has regained some strength but needs a muscle biopsy to diagnose his myositis.   Past Surgical History Janeann Forehand, CNA; 10/25/2020 10:27 AM) Cataract Surgery  Bilateral.  Diagnostic Studies History Janeann Forehand, CNA; 10/25/2020 10:27 AM) Colonoscopy  1-5 years ago  Allergies Janeann Forehand, CNA; 10/25/2020 10:30 AM) No Known Drug Allergies  [10/25/2020]: Allergies Reconciled   Medication History Janeann Forehand, CNA; 10/25/2020 10:30 AM) Accu-Chek Guide (In Vitro) Active. Accu-Chek Softclix Lancets Active. Atorvastatin Calcium (80MG  Tablet, Oral) Active. BD Insulin Syringe U/F (31G X 5/16"0.3 ML Misc,) Active. Brilinta (90MG  Tablet, Oral) Active. Clopidogrel Bisulfate (75MG  Tablet, Oral) Active. Farxiga (10MG  Tablet, Oral) Active. Furosemide (20MG  Tablet, Oral) Active. Jantoven (5MG  Tablet, Oral) Active. Methocarbamol (500MG  Tablet, Oral) Active. Mycophenolate Mofetil (500MG  Tablet, Oral) Active. NovoLOG (100UNIT/ML Solution, Subcutaneous) Active. Ondansetron HCl (4MG  Tablet, Oral) Active. Ozempic (1 MG/DOSE) (4MG /3ML Soln Pen-inj, Subcutaneous) Active. Pantoprazole Sodium (40MG  Tablet DR, Oral) Active. predniSONE (10MG  Tablet, Oral) Active. predniSONE (20MG  Tablet, Oral) Active. predniSONE (5MG  Tablet, Oral) Active. predniSONE (50MG  Tablet, Oral) Active. Ranolazine  ER (500MG  Tablet ER 12HR, Oral) Active. Repatha SureClick (140MG /ML Soln Auto-inj, Subcutaneous) Active. Rosuvastatin Calcium (40MG  Tablet, Oral) Active. Medications Reconciled  Social History Janeann Forehand, CNA; 10/25/2020 10:27 AM) Caffeine use  Carbonated beverages, Tea. No alcohol use  No drug use  Tobacco use  Never smoker.  Family History Janeann Forehand, CNA; 10/25/2020 10:27 AM) Breast Cancer  Mother. Colon Cancer  Mother. Colon Polyps  Mother. Heart Disease  Brother. Prostate Cancer  Brother.  Other Problems Janeann Forehand, CNA; 10/25/2020 10:27 AM) Cerebrovascular Accident  Chest pain  Congestive Heart Failure  Gastroesophageal Reflux Disease  Hypercholesterolemia  Myocardial infarction     Review of Systems [facility] CNA; 10/25/2020 10:27 AM) General Not Present- Appetite Loss, Chills, Fatigue, Fever, Night Sweats, Weight Gain and Weight Loss. Skin Present- Dryness and Non-Healing Wounds. Not Present- Change in Wart/Mole, Hives, Jaundice, New Lesions, Rash and Ulcer. HEENT Present- Wears glasses/contact lenses. Not Present- Earache, Hearing Loss, Hoarseness, Nose Bleed, Oral Ulcers, Ringing in the Ears, Seasonal Allergies, Sinus Pain, Sore Throat, Visual Disturbances and Yellow Eyes. Respiratory Not Present- Bloody sputum, Chronic Cough, Difficulty Breathing, Snoring and Wheezing. Breast Not Present- Breast Mass, Breast Pain, Nipple Discharge and Skin Changes. Cardiovascular Present- Swelling of Extremities. Not Present- Chest Pain, Difficulty Breathing Lying Down, Leg Cramps, Palpitations, Rapid Heart Rate and Shortness of Breath. Gastrointestinal Not Present- Abdominal Pain, Bloating, Bloody Stool, Change in Bowel Habits, Chronic diarrhea, Constipation, Difficulty Swallowing, Excessive gas, Gets full quickly at meals, Hemorrhoids, Indigestion, Nausea, Rectal Pain and Vomiting. Male Genitourinary Present- Frequency. Not Present- Blood in  Urine, Change in Urinary Stream, Impotence, Nocturia, Painful Urination, Urgency and Urine Leakage. Musculoskeletal Present- Muscle Weakness. Not Present- Back Pain, Joint Pain, Joint Stiffness, Muscle Pain and Swelling of Extremities. Neurological Present- Trouble walking and Weakness. Not Present- Decreased Memory, Fainting, Headaches, Numbness, Seizures, Tingling and Tremor. Psychiatric Not Present- Anxiety, Bipolar, Change in Sleep  Pattern, Depression, Fearful and Frequent crying. Endocrine Not Present- Cold Intolerance, Excessive Hunger, Hair Changes, Heat Intolerance, Hot flashes and New Diabetes. Hematology Not Present- Blood Thinners, Easy Bruising, Excessive bleeding, Gland problems, HIV and Persistent Infections.  Vitals Adriana Reams [place] CNA; 10/25/2020 10:31 AM) 10/25/2020 10:30 AM Weight: 164.5 lb Height: 66in Body Surface Area: 1.84 m Body Mass Index: 26.55 kg/m  Temp.: 97.58F  Pulse: 98 (Regular)  P.OX: 94% (Room air) BP: 140/80(Sitting, Left Arm, Standard)       Physical Exam Eugene Neri E. [name] MD; 10/25/2020 10:40 AM) General Mental Status-Alert. General Appearance-Consistent with stated age. Hydration-Well hydrated. Voice-Normal.  Head and Neck Head-normocephalic, atraumatic with no lesions or palpable masses.  Eye Sclera/Conjunctiva - Bilateral-No scleral icterus.  Chest and Lung Exam Chest and lung exam reveals -quiet, even and easy respiratory effort with no use of accessory muscles. Inspection Chest Wall - Normal. Back - normal.  Breast - Did not examine.  Cardiovascular Cardiovascular examination reveals -normal pedal pulses bilaterally. Note: regular rate and rhythm  Abdomen Inspection-Inspection Normal. Palpation/Percussion Palpation and Percussion of the abdomen reveal - Soft, Non Tender, No Rebound tenderness, No Rigidity (guarding) and No hepatosplenomegaly.  Peripheral Vascular Upper Extremity Inspection -  Bilateral - Normal - No Clubbing, No Cyanosis, No Edema, Pulses Intact. Lower Extremity Palpation - Edema - Bilateral - No edema - Bilateral.  Neurologic Neurologic evaluation reveals -alert and oriented x 3 with no impairment of recent or remote memory. Mental Status-Normal.  Musculoskeletal Global Assessment -Note: no gross deformities No tenderness on examination of muscle bodies, does have proximal muscular weakness upper and lower extremities.   Lymphatic Head & Neck  General Head & Neck Lymphatics: Bilateral - Description - Normal. Axillary  General Axillary Region: Bilateral - Description - Normal. Tenderness - Non Tender.    Assessment & Plan Eugene Neri E. 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 This will allow a detailed pathologic examination for further delineation of the cause of his myositis. His family member is having a knee replacement and a couple of weeks so I will try and schedule this urgently next week, both because it isn't urgent medical issue and to ensure some recovery prior to him assisting her after her surgery. I described the procedure, risks, and benefits of thigh muscle biopsy with him and answered his questions. I discussed the expected postoperative course.  He will hold his warfarin 2d before. He may continue the Brylinta and Plavix.

## 2020-10-25 NOTE — Patient Instructions (Addendum)
DUE TO COVID-19 ONLY ONE VISITOR IS ALLOWED TO COME WITH YOU AND STAY IN THE WAITING ROOM ONLY DURING PRE OP AND PROCEDURE DAY OF SURGERY. THE 1 VISITOR  MAY VISIT WITH YOU AFTER SURGERY IN YOUR PRIVATE ROOM DURING VISITING HOURS ONLY!                [name]   Your procedure is scheduled on: 11/01/20   Report to [facility] Main  Entrance   Report to admitting at: 8:30 AM     Call this number if you have problems the morning of surgery [phone]    Remember: Do not eat solid food :After Midnight. Clear liquids until: 7:30 am.  CLEAR LIQUID DIET  Foods Allowed                                                                     Foods Excluded  Coffee and tea, regular and decaf                             liquids that you cannot  Plain Jell-O any favor except red or purple                                           see through such as: Fruit ices (not with fruit pulp)                                     milk, soups, orange juice  Iced Popsicles                                    All solid food Carbonated beverages, regular and diet                                    Cranberry, grape and apple juices Sports drinks like Gatorade Lightly seasoned clear broth or consume(fat free) Sugar, honey syrup  Sample Menu Breakfast                                Lunch                                     Supper Cranberry juice                    Beef broth                            Chicken broth Jell-O  Grape juice                           Apple juice Coffee or tea                        Jell-O                                      Popsicle                                                Coffee or tea                        Coffee or tea  _____________________________________________________________________  BRUSH YOUR TEETH MORNING OF SURGERY AND RINSE YOUR MOUTH OUT, NO CHEWING GUM CANDY OR MINTS.    Take these medicines the morning of surgery  with A SIP OF WATER: metoprolol,pantoprazole,ranexa(ranolazine).  How to Manage Your Diabetes Before and After Surgery  Why is it important to control my blood sugar before and after surgery? . Improving blood sugar levels before and after surgery helps healing and can limit problems. . A way of improving blood sugar control is eating a healthy diet by: o  Eating less sugar and carbohydrates o  Increasing activity/exercise o  Talking with your doctor about reaching your blood sugar goals . High blood sugars (greater than 180 mg/dL) can raise your risk of infections and slow your recovery, so you will need to focus on controlling your diabetes during the weeks before surgery. . Make sure that the doctor who takes care of your diabetes knows about your planned surgery including the date and location.  How do I manage my blood sugar before surgery? . Check your blood sugar at least 4 times a day, starting 2 days before surgery, to make sure that the level is not too high or low. o Check your blood sugar the morning of your surgery when you wake up and every 2 hours until you get to the Short Stay unit. . If your blood sugar is less than 70 mg/dL, you will need to treat for low blood sugar: o Do not take insulin. o Treat a low blood sugar (less than 70 mg/dL) with  cup of clear juice (cranberry or apple), 4 glucose tablets, OR glucose gel. o Recheck blood sugar in 15 minutes after treatment (to make sure it is greater than 70 mg/dL). If your blood sugar is not greater than 70 mg/dL on recheck, call [phone] for further instructions. . Report your blood sugar to the short stay nurse when you get to Short Stay.  . If you are admitted to the hospital after surgery: o Your blood sugar will be checked by the staff and you will probably be given insulin after surgery (instead of oral diabetes medicines) to make sure you have good blood sugar levels. o The goal for blood sugar control after  surgery is 80-180 mg/dL.   WHAT DO I DO ABOUT MY DIABETES MEDICATION?   . THE DAY BEFORE SURGERY, take Novalog as usual for breakfast and lunch time. DO NOT take the bedtime dose.DO NOT take farxiga. Take ozempic as usual.Take lantus  as usual in the morning..       . THE MORNING OF SURGERY, take half of the lantus dose.  . If your CBG is greater than 220 mg/dL, you may take  of your sliding scale  . (correction) dose of insulin.    DO NOT TAKE ANY DIABETIC ORAL MEDICATIONS DAY OF YOUR SURGERY                               You may not have any metal on your body including hair pins and              piercings  Do not wear jewelry, lotions, powders or perfumes, deodorant             Men may shave face and neck.   Do not bring valuables to the hospital. [place].  Contacts, dentures or bridgework may not be worn into surgery.  Leave suitcase in the car. After surgery it may be brought to your room.     Patients discharged the day of surgery will not be allowed to drive home. IF YOU ARE HAVING SURGERY AND GOING HOME THE SAME DAY, YOU MUST HAVE AN ADULT TO DRIVE YOU HOME AND BE WITH YOU FOR 24 HOURS. YOU MAY GO HOME BY TAXI OR UBER OR ORTHERWISE, BUT AN ADULT MUST ACCOMPANY YOU HOME AND STAY WITH YOU FOR 24 HOURS.  Name and phone number of your driver:  Special Instructions: N/A              Please read over the following fact sheets you were given: _____________________________________________________________________          [facility] - Preparing for Surgery Before surgery, you can play an important role.  Because skin is not sterile, your skin needs to be as free of germs as possible.  You can reduce the number of germs on your skin by washing with CHG (chlorahexidine gluconate) soap before surgery.  CHG is an antiseptic cleaner which kills germs and bonds with the skin to continue killing germs even after washing. Please DO NOT use  if you have an allergy to CHG or antibacterial soaps.  If your skin becomes reddened/irritated stop using the CHG and inform your nurse when you arrive at Short Stay. Do not shave (including legs and underarms) for at least 48 hours prior to the first CHG shower.  You may shave your face/neck. Please follow these instructions carefully:  1.  Shower with CHG Soap the night before surgery and the  morning of Surgery.  2.  If you choose to wash your hair, wash your hair first as usual with your  normal  shampoo.  3.  After you shampoo, rinse your hair and body thoroughly to remove the  shampoo.                           4.  Use CHG as you would any other liquid soap.  You can apply chg directly  to the skin and wash                       Gently with a scrungie or clean washcloth.  5.  Apply the CHG Soap to your body ONLY FROM THE NECK DOWN.  Do not use on face/ open                           Wound or open sores. Avoid contact with eyes, ears mouth and genitals (private parts).                       Wash face,  Genitals (private parts) with your normal soap.             6.  Wash thoroughly, paying special attention to the area where your surgery  will be performed.  7.  Thoroughly rinse your body with warm water from the neck down.  8.  DO NOT shower/wash with your normal soap after using and rinsing off  the CHG Soap.                9.  Pat yourself dry with a clean towel.            10.  Wear clean pajamas.            11.  Place clean sheets on your bed the night of your first shower and do not  sleep with pets. Day of Surgery : Do not apply any lotions/deodorants the morning of surgery.  Please wear clean clothes to the hospital/surgery center.  FAILURE TO FOLLOW THESE INSTRUCTIONS MAY RESULT IN THE CANCELLATION OF YOUR SURGERY PATIENT SIGNATURE_________________________________  NURSE  SIGNATURE__________________________________  ________________________________________________________________________

## 2020-10-26 ENCOUNTER — Telehealth: Payer: Self-pay | Admitting: Internal Medicine

## 2020-10-26 ENCOUNTER — Encounter (HOSPITAL_COMMUNITY): Payer: Self-pay

## 2020-10-26 ENCOUNTER — Encounter (HOSPITAL_COMMUNITY)
Admission: RE | Admit: 2020-10-26 | Discharge: 2020-10-26 | Disposition: A | Discharge status: Home / Self Care | Payer: Medicare HMO | Source: Ambulatory Visit | Attending: General Surgery | Admitting: General Surgery

## 2020-10-26 ENCOUNTER — Other Ambulatory Visit: Payer: Self-pay

## 2020-10-26 ENCOUNTER — Ambulatory Visit: Payer: Medicare HMO | Admitting: Internal Medicine

## 2020-10-26 DIAGNOSIS — Z01812 Encounter for preprocedural laboratory examination: Secondary | ICD-10-CM | POA: Insufficient documentation

## 2020-10-26 HISTORY — DX: Interstitial pulmonary disease, unspecified: J84.9

## 2020-10-26 HISTORY — DX: Chronic kidney disease, unspecified: N18.9

## 2020-10-26 LAB — CBC
HCT: 45.1 % (ref 39.0–52.0)
Hemoglobin: 14.5 g/dL (ref 13.0–17.0)
MCH: 29.5 pg (ref 26.0–34.0)
MCHC: 32.2 g/dL (ref 30.0–36.0)
MCV: 91.9 fL (ref 80.0–100.0)
Platelets: 234 10*3/uL (ref 150–400)
RBC: 4.91 MIL/uL (ref 4.22–5.81)
RDW: 16 % — ABNORMAL HIGH (ref 11.5–15.5)
WBC: 16.4 10*3/uL — ABNORMAL HIGH (ref 4.0–10.5)
nRBC: 0 % (ref 0.0–0.2)

## 2020-10-26 LAB — BASIC METABOLIC PANEL
Anion gap: 12 (ref 5–15)
BUN: 44 mg/dL — ABNORMAL HIGH (ref 8–23)
CO2: 23 mmol/L (ref 22–32)
Calcium: 9 mg/dL (ref 8.9–10.3)
Chloride: 101 mmol/L (ref 98–111)
Creatinine, Ser: 1.97 mg/dL — ABNORMAL HIGH (ref 0.61–1.24)
GFR, Estimated: 37 mL/min — ABNORMAL LOW (ref >60)
Glucose, Bld: 315 mg/dL — ABNORMAL HIGH (ref 70–99)
Potassium: 5.4 mmol/L — ABNORMAL HIGH (ref 3.5–5.1)
Sodium: 136 mmol/L (ref 135–145)

## 2020-10-26 LAB — GLUCOSE, CAPILLARY: Glucose-Capillary: 302 mg/dL — ABNORMAL HIGH (ref 70–99)

## 2020-10-26 NOTE — Telephone Encounter (Signed)
   [facility] Pre-operative Risk Assessment    Request for surgical clearance:  1. What type of surgery is being performed? Thigh muscle biopsy   2. When is this surgery scheduled? 11/01/2020  3. What type of clearance is required (medical clearance vs. Pharmacy clearance to hold med vs. Both)? both  4. Are there any medications that need to be held prior to surgery and how long?  aspirin EC 81 MG tablet warfarin (COUMADIN) 5 MG tablet 5 day before surgery  5. Practice name and name of physician performing surgery? Dr. Georganna Skeans with [facility]  6. What is your office phone number [phone]   7.   What is your office fax number [phone]  8.   Anesthesia type (None, local, MAC, general) ? General   Eugene [name] 10/26/2020, 3:17 PM  _________________________________________________________________   (provider comments below)

## 2020-10-26 NOTE — Progress Notes (Addendum)
COVID Vaccine Completed: Yes Date COVID Vaccine completed: 11/28/19 COVID vaccine manufacturer:  [employer]   PCP - Dr. [name] Cardiologist - Dr. Cristopher Peru. LOV: 10/03/20 Pulmonologist: Dr. Cindra Presume 09/21/20 Chest x-ray - CT chest: 09/09/20 EKG -  Stress Test -  ECHO - 09/14/20 Cardiac Cath - 09/12/20 Pacemaker/ICD device last checked: A1-C: 9.0: 09/12/20 Sleep Study -  CPAP -   Fasting Blood Sugar - 50's-60's Checks Blood Sugar __3__ times a day  Blood Thinner Instructions:Coumadin,brilinta: will be held couple days before biopsy as per Dr. Biagio Borg instructions. Aspirin Instructions: Last Dose:  Anesthesia review: Hx: HTN,DIA,TIA's,angina,MI,CHF,VF. COVID(09/09/20),CKD(stage IV),ILD,DVT(09/08/20)  Patient denies shortness of breath, fever, cough and chest pain at PAT appointment   Patient verbalized understanding of instructions that were given to them at the PAT appointment. Patient was also instructed that they will need to review over the PAT instructions again at home before surgery.

## 2020-10-26 NOTE — Progress Notes (Signed)
Labs results: CBG: 302,Blood glucose: 315,K: 5.4,WBC: 16.4,Cr: 1.97

## 2020-10-26 NOTE — Progress Notes (Addendum)
Anesthesia Chart Review   Case: [id number] Date/Time: 11/01/20 1015   Procedure: THIGH MUSCLE BIOPSY (N/A )   Anesthesia type: General   Pre-op diagnosis: myositis   Location: [facility] ROOM 01 / [facility]   Surgeons: Georganna Skeans, MD      DISCUSSION:64 y.o. never smoker with h/o HTN, DM II, CHF, CAD (CABG 2006, PCI 06/2020, cutting balloon 09/12/2020), HFpEF, CKD Stage IV, V. Fib arrest s/p ICD insertion (device orders in progress note 10/27/20), DVT 09/08/20, ILD, myositis scheduled for above procedure 11/01/2020 with Dr. Georganna Skeans.   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  Pt last seen by pulmonology 10/24/2020. Per OV note pt started on prednisone by rheumatology which resulted in some improvement of shortness of breath.    Pt last seen by cardiology 09/21/2020.  Per cardiology muscle biopsy to be deferred for 1 month. Pt to be on triple therapy for one month.   Cardiac Clearance requested.   Addendum 10/30/2020:  Per cardiology preoperative evaluation 10/27/20, "Chart reviewed as part of pre-operative protocol coverage. Given past medical history and time since last visit, based on ACC/AHA guidelines, Eugene [name] would be at acceptable risk for the planned procedure without further cardiovascular testing.  Patient on warfarin for non-cardiac indication and not followed by [facility]. We don't have access to INR results or anticoagulation notes. Noted hx of TIA, PAD and DVT. Note from Feb/2022 mentioned "recent DVT" , but date not documented. Warfarin recommendation should be provided by current warfarin clinic. Per Dr. Gasper Sells- Eugene [name]  was contacted on 10/19/20 do drop the aspirin. Ideally given his cutting balloon on 09/12/2020 he will be on therapy for three months (12/10/20) , but is still having significant walking issues, and needs this procedure, then his Brilinta may be held, though this would place him at a higher risk."  Discussed with Dr. Biagio Borg office who will contact  warfarin clinic for warfarin instructions.  VS: BP 115/82   Pulse 88   Temp 36.4 C (Oral)   Ht 5\' 6"  (1.676 m)   Wt 74.4 kg   SpO2 96%   BMI 26.47 kg/m   PROVIDERS: [name], MD is PCP   Cristopher Peru, MD is Cardiologist   Brand Males, MD is Pulmonologist  LABS: Labs reviewed: Acceptable for surgery. (all labs ordered are listed, but only abnormal results are displayed)  Labs Reviewed  GLUCOSE, CAPILLARY - Abnormal; Notable for the following components:      Result Value   Glucose-Capillary 302 (*)    All other components within normal limits  BASIC METABOLIC PANEL - Abnormal; Notable for the following components:   Potassium 5.4 (*)    Glucose, Bld 315 (*)    BUN 44 (*)    Creatinine, Ser 1.97 (*)    GFR, Estimated 37 (*)    All other components within normal limits  CBC - Abnormal; Notable for the following components:   WBC 16.4 (*)    RDW 16.0 (*)    All other components within normal limits     IMAGES:   EKG: 09/14/2020 Rate 92 bpm  Atrial fibrillation  Left axis deviation  ST & T wave abnormality, consider lateral ischemia   CV: Echo 09/14/2020 IMPRESSIONS    1. Left ventricular ejection fraction, by estimation, is 45 to 50%. The  left ventricle has mildly decreased function. The left ventricle  demonstrates regional wall motion abnormalities (see scoring  diagram/findings for description).  2. Right ventricular systolic function is normal.  The right ventricular  size is normal.  3. The mitral valve is normal in structure. Trivial mitral valve  regurgitation. No evidence of mitral stenosis.  4. The aortic valve is grossly normal. There is mild calcification of the  aortic valve. There is mild thickening of the aortic valve. Aortic valve  regurgitation is not visualized. No aortic stenosis is present.  Past Medical History:  Diagnosis Date  . Abnormal nuclear stress test 02/19/2018  . Angina, class III ([place]) 02/19/2018  . Anxiety    . CHF (congestive heart failure) ([place])   . Coronary artery disease   . DDD (degenerative disc disease), cervical 03/01/2013  . Diabetes mellitus    type 2  . 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 Dyslipidemia   . Hypertension   . Lumbosacral radiculopathy at L4 02/18/2017  . Myocardial infarction ([place]) 2012  . Obesity   . Snoring 10/12/2013  . TIA (transient ischemic attack) 08/09/2014  . Tubular adenoma of colon 01/12/2014   transverse colon  . VF (ventricular fibrillation) ([place])    arrest  . Vitamin D deficiency 02/18/2017    Past Surgical History:  Procedure Laterality Date  . ABDOMINAL AORTAGRAM N/A 12/14/2013   Procedure: ABDOMINAL Maxcine Ham;  Surgeon: [name], MD;  Location: [facility] CATH LAB;  Service: Cardiovascular;  Laterality: N/A;  . CARDIAC CATHETERIZATION  09/16/10  . COLONOSCOPY  01/12/2014  . CORONARY ARTERY BYPASS GRAFT  2006  . CORONARY BALLOON ANGIOPLASTY N/A 09/12/2020   Procedure: CORONARY BALLOON ANGIOPLASTY;  Surgeon: Troy Sine, MD;  Location: [place] CV LAB;  Service: Cardiovascular;  Laterality: N/A;  . CORONARY STENT INTERVENTION  07/06/2020  . CORONARY STENT INTERVENTION N/A 07/06/2020   Procedure: CORONARY STENT INTERVENTION;  Surgeon: Nelva Bush, MD;  Location: [place] CV LAB;  Service: Cardiovascular;  Laterality: N/A;  . IMPLANTABLE CARDIOVERTER DEFIBRILLATOR IMPLANT  2012   STJ ICD- single chamber  . LEFT HEART CATH AND CORS/GRAFTS ANGIOGRAPHY N/A 02/19/2018   Procedure: LEFT HEART CATH AND CORS/GRAFTS ANGIOGRAPHY;  Surgeon: [name], MD;  Location: [place] CV LAB;  Service: Cardiovascular;  Laterality: N/A;  . LEFT HEART CATH AND CORS/GRAFTS ANGIOGRAPHY N/A 07/06/2020   Procedure: LEFT HEART CATH AND CORS/GRAFTS ANGIOGRAPHY;  Surgeon: Nelva Bush, MD;  Location: [place] [facility] LAB;  Service: Cardiovascular;  Laterality: N/A;  . LEFT HEART CATH AND CORS/GRAFTS ANGIOGRAPHY N/A 09/12/2020   Procedure: LEFT HEART CATH AND  CORS/GRAFTS ANGIOGRAPHY;  Surgeon: Troy Sine, MD;  Location: [place] CV LAB;  Service: Cardiovascular;  Laterality: N/A;  . LOOP RECORDER IMPLANT N/A 08/24/2014   Procedure: LOOP RECORDER IMPLANT;  Surgeon: [name], MD;  Location: [facility] CATH LAB;  Service: Cardiovascular;  Laterality: N/A;  . MASS EXCISION  2008   right shoulder  . STERNECTOMY      MEDICATIONS: . aspirin EC 81 MG tablet  . dapagliflozin propanediol (FARXIGA) 10 MG TABS tablet  . Evolocumab (REPATHA SURECLICK) 244 MG/ML SOAJ  . furosemide (LASIX) 40 MG tablet  . insulin aspart (NOVOLOG) 100 UNIT/ML injection  . insulin glargine (LANTUS) 100 UNIT/ML Solostar Pen  . Insulin Syringe-Needle U-100 25G X 1" 1 ML MISC  . metoprolol succinate (TOPROL-XL) 50 MG 24 hr tablet  . mycophenolate (CELLCEPT) 500 MG tablet  . nitroGLYCERIN (NITROSTAT) 0.4 MG SL tablet  . pantoprazole (PROTONIX) 40 MG tablet  . predniSONE (DELTASONE) 5 MG tablet  . ranolazine (RANEXA) 500 MG 12 hr tablet  . Semaglutide, 1  MG/DOSE, (OZEMPIC, 1 MG/DOSE,) 4 MG/3ML SOPN  . ticagrelor (BRILINTA) 90 MG TABS tablet  . vitamin B-12 (CYANOCOBALAMIN) 1000 MCG tablet  . warfarin (COUMADIN) 5 MG tablet   No current facility-administered medications for this encounter.    Konrad Felix, PA-C [facility] [phone]

## 2020-10-26 NOTE — Telephone Encounter (Signed)
Patient on warfarin for non-cardiac indication and not followed by [facility]. We don't have access to INR results or anticoagulation notes.  Noted hx of TIA, PAD and DVT. Note from Feb/2022 mentioned "recent DVT" , but date not documented.  Warfarin recommendation should be provided by current warfarin clinic.

## 2020-10-27 ENCOUNTER — Encounter: Payer: Self-pay | Admitting: Internal Medicine

## 2020-10-27 DIAGNOSIS — B961 Klebsiella pneumoniae [K. pneumoniae] as the cause of diseases classified elsewhere: Secondary | ICD-10-CM | POA: Diagnosis not present

## 2020-10-27 DIAGNOSIS — I82401 Acute embolism and thrombosis of unspecified deep veins of right lower extremity: Secondary | ICD-10-CM | POA: Diagnosis not present

## 2020-10-27 DIAGNOSIS — I130 Hypertensive heart and chronic kidney disease with heart failure and stage 1 through stage 4 chronic kidney disease, or unspecified chronic kidney disease: Secondary | ICD-10-CM | POA: Diagnosis not present

## 2020-10-27 DIAGNOSIS — I214 Non-ST elevation (NSTEMI) myocardial infarction: Secondary | ICD-10-CM | POA: Diagnosis not present

## 2020-10-27 DIAGNOSIS — U071 COVID-19: Secondary | ICD-10-CM | POA: Diagnosis not present

## 2020-10-27 DIAGNOSIS — I2510 Atherosclerotic heart disease of native coronary artery without angina pectoris: Secondary | ICD-10-CM | POA: Diagnosis not present

## 2020-10-27 DIAGNOSIS — N390 Urinary tract infection, site not specified: Secondary | ICD-10-CM | POA: Diagnosis not present

## 2020-10-27 DIAGNOSIS — I4901 Ventricular fibrillation: Secondary | ICD-10-CM | POA: Diagnosis not present

## 2020-10-27 DIAGNOSIS — Z48812 Encounter for surgical aftercare following surgery on the circulatory system: Secondary | ICD-10-CM | POA: Diagnosis not present

## 2020-10-27 NOTE — Telephone Encounter (Addendum)
   Primary Cardiologist: Werner Lean, MD  Chart reviewed as part of pre-operative protocol coverage. Given past medical history and time since last visit, based on ACC/AHA guidelines, Eugene [name] would be at acceptable risk for the planned procedure without further cardiovascular testing.   Patient on warfarin for non-cardiac indication and not followed by [facility]. We don't have access to INR results or anticoagulation notes.  Noted hx of TIA, PAD and DVT. Note from Feb/2022 mentioned "recent DVT" , but date not documented.  Warfarin recommendation should be provided by current warfarin clinic.  Per Dr. Gasper Sells- Eugene [name]  was contacted on 10/19/20 do drop the aspirin.  Ideally given his cutting balloon on 09/12/2020 he will be on therapy for three months (12/10/20) , but is still having significant walking issues, and needs this procedure, then his Brilinta may be held, though this would place him at a higher risk.  I will route this recommendation to the requesting party via Epic fax function and remove from pre-op pool.  Please call with questions.  Eugene [name]. Safi Culotta NP-C    10/27/2020, 3:13 PM [facility] [address] Office [phone] Fax [phone]

## 2020-10-27 NOTE — Telephone Encounter (Addendum)
Eugene [name] 65 year old male was last seen 09/21/2020.  He is requesting cardiac clearance for muscle biopsy.  During that time he reported he was doing okay.  He noted some a.m. nausea.  He denied chest pain and shortness of breath.  He denied orthopnea PND.  He had no weight gain or lower extremity edema.  He denied capitation's and syncope.  He did for having some leg weakness that had improved.  He denied leg pain.  He was being seen post hospitalization for COVID-19 infection.  He was seen 09/08/2020 for leg weakness.  He was found to have DVT and started on Coumadin.  There was concern for atrial fibrillation during his hospitalization.  He had a positive rheumatic work-up and was also positive for COVID-19.   His PMH includes coronary artery disease with distant CABG, HFpEF, diabetes mellitus with hypertension, hyperlipidemia, prior CVA, PDA with prior cath and no intervention.  Prior VF arrest status post ICD by Dr. Lovena Le.  He underwent Cutting Balloon procedure 09/12/2020.   May his aspirin be held prior to the procedure?  Thank you for your help.  Please direct response to [facility] preop pool.  Jossie Ng. Carrera Kiesel NP-C    10/27/2020, 7:36 AM [place] [address] Office [phone] Fax [phone]

## 2020-10-27 NOTE — Telephone Encounter (Signed)
We reached  Out to Mr. Gervacio on 10/19/20 do drop the aspirin.  Ideally given his cutting balloon on 09/12/2020 he will be on therapy for three months (12/10/20) , but is still having significant walking issues, and needs this procedure, then his Brilinta may be held, though this would place him at a higher risk.  Werner Lean, MD

## 2020-10-27 NOTE — Progress Notes (Signed)
[facility] DEVICE PROGRAMMING  Patient Information: Name:  Eugene [name]  DOB:  Feb 24, 1956  MRN:  [id number]    Joline Maxcy, RN  P [facility] Device Planned Procedure: Thigh muscle biopsy  Surgeon: Dr Georganna Skeans  Date of Procedure: 11/01/20  Cautery will be used.  Position during surgery:    Please send documentation back to:  [facility] (Fax # [phone])   Joline Maxcy, RN  10/26/2020 8:39 AM   Device Information:  Clinic EP Physician:  Cristopher Peru, MD   Device Type:  Defibrillator Manufacturer and Phone #:  [employer]: [phone] Pacemaker Dependent?:  No. Date of Last Device Check:  10/27/20 remote; 08/04/20 in-clinic Normal Device Function?:  Yes.    Electrophysiologist's Recommendations:   Have magnet available.  Provide continuous ECG monitoring when magnet is used or reprogramming is to be performed.   Procedure should not interfere with device function.  No device programming or magnet placement needed.  Per Device Clinic Standing Orders, York Ram, RN  3:51 PM 10/27/2020

## 2020-10-30 ENCOUNTER — Other Ambulatory Visit (HOSPITAL_COMMUNITY): Payer: Medicare HMO

## 2020-10-30 DIAGNOSIS — E1351 Other specified diabetes mellitus with diabetic peripheral angiopathy without gangrene: Secondary | ICD-10-CM | POA: Diagnosis not present

## 2020-10-30 DIAGNOSIS — I4891 Unspecified atrial fibrillation: Secondary | ICD-10-CM | POA: Diagnosis not present

## 2020-10-30 DIAGNOSIS — Z7901 Long term (current) use of anticoagulants: Secondary | ICD-10-CM | POA: Diagnosis not present

## 2020-10-30 DIAGNOSIS — E1159 Type 2 diabetes mellitus with other circulatory complications: Secondary | ICD-10-CM | POA: Diagnosis not present

## 2020-10-30 DIAGNOSIS — I2510 Atherosclerotic heart disease of native coronary artery without angina pectoris: Secondary | ICD-10-CM | POA: Diagnosis not present

## 2020-10-30 DIAGNOSIS — I82431 Acute embolism and thrombosis of right popliteal vein: Secondary | ICD-10-CM | POA: Diagnosis not present

## 2020-10-30 DIAGNOSIS — Z794 Long term (current) use of insulin: Secondary | ICD-10-CM | POA: Diagnosis not present

## 2020-10-30 DIAGNOSIS — N184 Chronic kidney disease, stage 4 (severe): Secondary | ICD-10-CM | POA: Diagnosis not present

## 2020-10-30 DIAGNOSIS — J849 Interstitial pulmonary disease, unspecified: Secondary | ICD-10-CM | POA: Diagnosis not present

## 2020-10-31 NOTE — Anesthesia Preprocedure Evaluation (Addendum)
Anesthesia Evaluation  Patient identified by MRN, date of birth, ID band Patient awake    Reviewed: Allergy & Precautions, NPO status , Patient's Chart, lab work & pertinent test results, reviewed documented beta blocker date and time   History of Anesthesia Complications Negative for: history of anesthetic complications  Airway Mallampati: II  TM Distance: >3 FB Neck ROM: Full    Dental  (+) Dental Advisory Given, Teeth Intact   Pulmonary sleep apnea ,    Pulmonary exam normal        Cardiovascular hypertension, Pt. on home beta blockers and Pt. on medications + CAD, + Past MI, + CABG, + Peripheral Vascular Disease, +CHF and + DVT  Normal cardiovascular exam+ dysrhythmias Atrial Fibrillation and Ventricular Fibrillation + pacemaker + Cardiac Defibrillator    '22 TTE - EF 45-50%, trivial MR/TR/PR. The anterior septum, inferior wall, basal inferoseptal segment, and apex are hypokinetic.     Neuro/Psych PSYCHIATRIC DISORDERS Anxiety TIA   GI/Hepatic Neg liver ROS, GERD  Medicated and Controlled,  Endo/Other  diabetes, Type 2, Insulin Dependent  Renal/GU CRFRenal disease     Musculoskeletal  (+) Arthritis ,  Myositis    Abdominal   Peds  Hematology  On ticagrelor and coumadin    Anesthesia Other Findings See PAT note Covid+ 08/2020, asymptomatic    Reproductive/Obstetrics                           Anesthesia Physical Anesthesia Plan  ASA: IV  Anesthesia Plan: General   Post-op Pain Management:    Induction: Intravenous  PONV Risk Score and Plan: 2 and Treatment may vary due to age or medical condition, Ondansetron and Dexamethasone  Airway Management Planned: LMA  Additional Equipment: None  Intra-op Plan:   Post-operative Plan: Extubation in OR  Informed Consent: I have reviewed the patients History and Physical, chart, labs and discussed the procedure including the risks,  benefits and alternatives for the proposed anesthesia with the patient or authorized representative who has indicated his/her understanding and acceptance.     Dental advisory given  Plan Discussed with: CRNA and Anesthesiologist  Anesthesia Plan Comments:       Anesthesia Quick Evaluation

## 2020-11-01 ENCOUNTER — Encounter (HOSPITAL_COMMUNITY): Payer: Self-pay | Admitting: General Surgery

## 2020-11-01 ENCOUNTER — Ambulatory Visit (HOSPITAL_COMMUNITY)
Admission: RE | Admit: 2020-11-01 | Discharge: 2020-11-01 | Disposition: A | Discharge status: Home / Self Care | Payer: Medicare HMO | Source: Ambulatory Visit | Attending: General Surgery | Admitting: General Surgery

## 2020-11-01 ENCOUNTER — Ambulatory Visit (HOSPITAL_COMMUNITY): Payer: Medicare HMO | Admitting: Physician Assistant

## 2020-11-01 ENCOUNTER — Encounter (HOSPITAL_COMMUNITY)
Admission: RE | Disposition: A | Discharge status: Home / Self Care | Payer: Self-pay | Source: Ambulatory Visit | Attending: General Surgery

## 2020-11-01 ENCOUNTER — Ambulatory Visit: Payer: Medicare HMO | Admitting: Internal Medicine

## 2020-11-01 DIAGNOSIS — Z800 Family history of malignant neoplasm of digestive organs: Secondary | ICD-10-CM | POA: Diagnosis not present

## 2020-11-01 DIAGNOSIS — Z7984 Long term (current) use of oral hypoglycemic drugs: Secondary | ICD-10-CM | POA: Diagnosis not present

## 2020-11-01 DIAGNOSIS — N184 Chronic kidney disease, stage 4 (severe): Secondary | ICD-10-CM | POA: Diagnosis not present

## 2020-11-01 DIAGNOSIS — I130 Hypertensive heart and chronic kidney disease with heart failure and stage 1 through stage 4 chronic kidney disease, or unspecified chronic kidney disease: Secondary | ICD-10-CM | POA: Diagnosis not present

## 2020-11-01 DIAGNOSIS — Z7902 Long term (current) use of antithrombotics/antiplatelets: Secondary | ICD-10-CM | POA: Insufficient documentation

## 2020-11-01 DIAGNOSIS — Z8616 Personal history of COVID-19: Secondary | ICD-10-CM | POA: Insufficient documentation

## 2020-11-01 DIAGNOSIS — Z950 Presence of cardiac pacemaker: Secondary | ICD-10-CM | POA: Insufficient documentation

## 2020-11-01 DIAGNOSIS — M62552 Muscle wasting and atrophy, not elsewhere classified, left thigh: Secondary | ICD-10-CM | POA: Diagnosis not present

## 2020-11-01 DIAGNOSIS — Z79899 Other long term (current) drug therapy: Secondary | ICD-10-CM | POA: Diagnosis not present

## 2020-11-01 DIAGNOSIS — Z794 Long term (current) use of insulin: Secondary | ICD-10-CM | POA: Diagnosis not present

## 2020-11-01 DIAGNOSIS — Z8673 Personal history of transient ischemic attack (TIA), and cerebral infarction without residual deficits: Secondary | ICD-10-CM | POA: Insufficient documentation

## 2020-11-01 DIAGNOSIS — I509 Heart failure, unspecified: Secondary | ICD-10-CM | POA: Diagnosis not present

## 2020-11-01 DIAGNOSIS — Z951 Presence of aortocoronary bypass graft: Secondary | ICD-10-CM | POA: Insufficient documentation

## 2020-11-01 DIAGNOSIS — E1122 Type 2 diabetes mellitus with diabetic chronic kidney disease: Secondary | ICD-10-CM | POA: Diagnosis not present

## 2020-11-01 DIAGNOSIS — M609 Myositis, unspecified: Secondary | ICD-10-CM | POA: Diagnosis not present

## 2020-11-01 DIAGNOSIS — Z7901 Long term (current) use of anticoagulants: Secondary | ICD-10-CM | POA: Insufficient documentation

## 2020-11-01 DIAGNOSIS — Z7952 Long term (current) use of systemic steroids: Secondary | ICD-10-CM | POA: Diagnosis not present

## 2020-11-01 HISTORY — PX: MUSCLE BIOPSY: SHX716

## 2020-11-01 LAB — GLUCOSE, CAPILLARY
Glucose-Capillary: 109 mg/dL — ABNORMAL HIGH (ref 70–99)
Glucose-Capillary: 126 mg/dL — ABNORMAL HIGH (ref 70–99)

## 2020-11-01 SURGERY — MUSCLE BIOPSY
Anesthesia: General | Site: Thigh | Laterality: Left

## 2020-11-01 MED ORDER — ONDANSETRON HCL 4 MG/2ML IJ SOLN
INTRAMUSCULAR | Status: DC | PRN
  Administered 2020-11-01: 4 mg via INTRAVENOUS

## 2020-11-01 MED ORDER — EPHEDRINE SULFATE 50 MG/ML IJ SOLN
INTRAMUSCULAR | Status: DC | PRN
  Administered 2020-11-01 (×2): 10 mg via INTRAVENOUS
  Administered 2020-11-01: 5 mg via INTRAVENOUS
  Administered 2020-11-01 (×2): 10 mg via INTRAVENOUS

## 2020-11-01 MED ORDER — EPHEDRINE 5 MG/ML INJ
INTRAVENOUS | Status: AC
  Filled 2020-11-01: qty 10

## 2020-11-01 MED ORDER — OXYCODONE HCL 5 MG/5ML PO SOLN: 5.0000 mg | Freq: Once | ORAL | Status: DC | PRN

## 2020-11-01 MED ORDER — DEXAMETHASONE SODIUM PHOSPHATE 4 MG/ML IJ SOLN
INTRAMUSCULAR | Status: DC | PRN
  Administered 2020-11-01: 4 mg via INTRAVENOUS

## 2020-11-01 MED ORDER — VASOPRESSIN 20 UNIT/ML IV SOLN
INTRAVENOUS | Status: DC | PRN
  Administered 2020-11-01 (×2): 1 [IU] via INTRAVENOUS

## 2020-11-01 MED ORDER — SODIUM CHLORIDE 0.9 % IR SOLN
Status: DC | PRN
  Administered 2020-11-01: 1000 mL

## 2020-11-01 MED ORDER — CHLORHEXIDINE GLUCONATE 0.12 % MT SOLN: 15.0000 mL | Freq: Once | OROMUCOSAL | Status: AC

## 2020-11-01 MED ORDER — CHLORHEXIDINE GLUCONATE CLOTH 2 % EX PADS: 6.0000 | MEDICATED_PAD | Freq: Once | CUTANEOUS | Status: DC

## 2020-11-01 MED ORDER — FENTANYL CITRATE (PF) 100 MCG/2ML IJ SOLN: 25.0000 ug | INTRAMUSCULAR | Status: DC | PRN

## 2020-11-01 MED ORDER — VASOPRESSIN 20 UNIT/ML IV SOLN
INTRAVENOUS | Status: AC
  Filled 2020-11-01: qty 1

## 2020-11-01 MED ORDER — MIDAZOLAM HCL 2 MG/2ML IJ SOLN
INTRAMUSCULAR | Status: AC
  Filled 2020-11-01: qty 2

## 2020-11-01 MED ORDER — PHENYLEPHRINE HCL (PRESSORS) 10 MG/ML IV SOLN
INTRAVENOUS | Status: DC | PRN
  Administered 2020-11-01 (×5): 80 ug via INTRAVENOUS

## 2020-11-01 MED ORDER — OXYCODONE HCL 5 MG PO TABS: 5.0000 mg | ORAL_TABLET | Freq: Once | ORAL | Status: DC | PRN

## 2020-11-01 MED ORDER — ACETAMINOPHEN 500 MG PO TABS
1000.0000 mg | ORAL_TABLET | ORAL | Status: AC
  Administered 2020-11-01: 1000 mg via ORAL
  Filled 2020-11-01: qty 2

## 2020-11-01 MED ORDER — LIDOCAINE 2% (20 MG/ML) 5 ML SYRINGE
INTRAMUSCULAR | Status: DC | PRN
  Administered 2020-11-01: 80 mg via INTRAVENOUS

## 2020-11-01 MED ORDER — PHENYLEPHRINE 40 MCG/ML (10ML) SYRINGE FOR IV PUSH (FOR BLOOD PRESSURE SUPPORT)
PREFILLED_SYRINGE | INTRAVENOUS | Status: AC
  Filled 2020-11-01: qty 10

## 2020-11-01 MED ORDER — OXYCODONE HCL 5 MG PO TABS: 5.0000 mg | ORAL_TABLET | Freq: Four times a day (QID) | ORAL | 0 refills | Status: DC | PRN

## 2020-11-01 MED ORDER — ORAL CARE MOUTH RINSE
15.0000 mL | Freq: Once | OROMUCOSAL | Status: AC
  Administered 2020-11-01: 15 mL via OROMUCOSAL

## 2020-11-01 MED ORDER — LACTATED RINGERS IV SOLN: INTRAVENOUS | Status: DC

## 2020-11-01 MED ORDER — MIDAZOLAM HCL 5 MG/5ML IJ SOLN
INTRAMUSCULAR | Status: DC | PRN
  Administered 2020-11-01: 2 mg via INTRAVENOUS

## 2020-11-01 MED ORDER — LIDOCAINE HCL (PF) 1 % IJ SOLN
INTRAMUSCULAR | Status: AC
  Filled 2020-11-01: qty 30

## 2020-11-01 MED ORDER — BUPIVACAINE-EPINEPHRINE (PF) 0.25% -1:200000 IJ SOLN
INTRAMUSCULAR | Status: AC
  Filled 2020-11-01: qty 30

## 2020-11-01 MED ORDER — PROPOFOL 10 MG/ML IV BOLUS
INTRAVENOUS | Status: DC | PRN
  Administered 2020-11-01: 100 mg via INTRAVENOUS

## 2020-11-01 MED ORDER — FENTANYL CITRATE (PF) 100 MCG/2ML IJ SOLN
INTRAMUSCULAR | Status: AC
  Filled 2020-11-01: qty 2

## 2020-11-01 MED ORDER — CEFAZOLIN SODIUM-DEXTROSE 2-4 GM/100ML-% IV SOLN
2.0000 g | INTRAVENOUS | Status: AC
  Administered 2020-11-01: 2 g via INTRAVENOUS
  Filled 2020-11-01: qty 100

## 2020-11-01 MED ORDER — PROPOFOL 10 MG/ML IV BOLUS
INTRAVENOUS | Status: AC
  Filled 2020-11-01: qty 40

## 2020-11-01 MED ORDER — FENTANYL CITRATE (PF) 100 MCG/2ML IJ SOLN
INTRAMUSCULAR | Status: DC | PRN
  Administered 2020-11-01 (×3): 25 ug via INTRAVENOUS

## 2020-11-01 MED ORDER — BUPIVACAINE-EPINEPHRINE 0.25% -1:200000 IJ SOLN
INTRAMUSCULAR | Status: DC | PRN
  Administered 2020-11-01: 16 mL

## 2020-11-01 SURGICAL SUPPLY — 29 items
BENZOIN TINCTURE PRP APPL 2/3 (GAUZE/BANDAGES/DRESSINGS) IMPLANT
BNDG ELASTIC 4X5.8 VLCR STR LF (GAUZE/BANDAGES/DRESSINGS) ×2 IMPLANT
COVER WAND RF STERILE (DRAPES) IMPLANT
DECANTER SPIKE VIAL GLASS SM (MISCELLANEOUS) IMPLANT
DERMABOND ADVANCED (GAUZE/BANDAGES/DRESSINGS) ×1
DERMABOND ADVANCED .7 DNX12 (GAUZE/BANDAGES/DRESSINGS) ×1 IMPLANT
DRAPE LAPAROTOMY T 102X78X121 (DRAPES) IMPLANT
DRAPE LAPAROTOMY TRNSV 102X78 (DRAPES) IMPLANT
DRAPE SHEET LG 3/4 BI-LAMINATE (DRAPES) IMPLANT
ELECT REM PT RETURN 15FT ADLT (MISCELLANEOUS) ×2 IMPLANT
EVACUATOR SILICONE 100CC (DRAIN) IMPLANT
GAUZE SPONGE 4X4 12PLY STRL (GAUZE/BANDAGES/DRESSINGS) ×2 IMPLANT
GLOVE SURG LTX SZ8 (GLOVE) ×2 IMPLANT
GLOVE SURG UNDER POLY LF SZ7 (GLOVE) ×2 IMPLANT
GOWN STRL REUS W/TWL LRG LVL3 (GOWN DISPOSABLE) ×2 IMPLANT
GOWN STRL REUS W/TWL XL LVL3 (GOWN DISPOSABLE) ×4 IMPLANT
KIT BASIN OR (CUSTOM PROCEDURE TRAY) ×2 IMPLANT
KIT TURNOVER KIT A (KITS) ×2 IMPLANT
MARKER SKIN DUAL TIP RULER LAB (MISCELLANEOUS) IMPLANT
NEEDLE HYPO 25X1 1.5 SAFETY (NEEDLE) ×2 IMPLANT
NS IRRIG 1000ML POUR BTL (IV SOLUTION) ×2 IMPLANT
PACK GENERAL/GYN (CUSTOM PROCEDURE TRAY) ×2 IMPLANT
SPONGE LAP 4X18 RFD (DISPOSABLE) IMPLANT
STRIP CLOSURE SKIN 1/2X4 (GAUZE/BANDAGES/DRESSINGS) IMPLANT
SUT MNCRL AB 4-0 PS2 18 (SUTURE) IMPLANT
SUT VIC AB 3-0 SH 18 (SUTURE) IMPLANT
SYR CONTROL 10ML LL (SYRINGE) ×2 IMPLANT
TOWEL OR 17X26 10 PK STRL BLUE (TOWEL DISPOSABLE) ×2 IMPLANT
WATER STERILE IRR 1000ML POUR (IV SOLUTION) ×2 IMPLANT

## 2020-11-01 NOTE — Anesthesia Postprocedure Evaluation (Signed)
Anesthesia Post Note  Patient: Eugene [name]  Procedure(s) Performed: THIGH MUSCLE BIOPSY (Left Thigh)     Patient location during evaluation: PACU Anesthesia Type: General Level of consciousness: awake and alert Pain management: pain level controlled Vital Signs Assessment: post-procedure vital signs reviewed and stable Respiratory status: spontaneous breathing, nonlabored ventilation and respiratory function stable Cardiovascular status: blood pressure returned to baseline and stable Postop Assessment: no apparent nausea or vomiting Anesthetic complications: no   No complications documented.  Last Vitals:  Vitals:   11/01/20 1130 11/01/20 1138  BP: 122/71 108/71  Pulse: 76 80  Resp: 20 17  Temp: 36.4 C 36.4 C  SpO2: 94% 94%    Last Pain:  Vitals:   11/01/20 1138  TempSrc: Oral  PainSc: 0-No pain                 Audry Pili

## 2020-11-01 NOTE — Discharge Instructions (Signed)

## 2020-11-01 NOTE — Transfer of Care (Signed)
Immediate Anesthesia Transfer of Care Note  Patient: Eugene [name]  Procedure(s) Performed: THIGH MUSCLE BIOPSY (Left Thigh)  Patient Location: PACU  Anesthesia Type:General  Level of Consciousness: awake, alert  and oriented  Airway & Oxygen Therapy: Patient Spontanous Breathing and Patient connected to face mask  Post-op Assessment: Report given to RN and Post -op Vital signs reviewed and stable  Post vital signs: Reviewed and stable  Last Vitals:  Vitals Value Taken Time  BP 119/80 11/01/20 1100  Temp    Pulse 72 11/01/20 1101  Resp 16 11/01/20 1101  SpO2 100 % 11/01/20 1101  Vitals shown include unvalidated device data.  Last Pain:  Vitals:   11/01/20 0856  TempSrc: Oral         Complications: No complications documented.

## 2020-11-01 NOTE — Op Note (Signed)
  11/01/2020  10:52 AM  PATIENT:  Eugene [name]  65 y.o. male  PRE-OPERATIVE DIAGNOSIS:  Myositis  POST-OPERATIVE DIAGNOSIS:  Myositis  PROCEDURE:  Procedure(s): LEFT THIGH MUSCLE BIOPSY  SURGEON:  Surgeon(s): Georganna Skeans, MD  ASSISTANTS: none   ANESTHESIA:   local and general  EBL:  No intake/output data recorded.  BLOOD ADMINISTERED:none  DRAINS: none   SPECIMEN:  Excision  DISPOSITION OF SPECIMEN:  PATHOLOGY  COUNTS:  YES  DICTATION: .Dragon Dictation Procedure in detail: Informed consent was obtained.  His site was marked.  He received intravenous antibiotics.  He was brought to the operating room and general anesthesia with laryngeal mask airway was administered by the anesthesia staff.  His left side was prepped and draped anteriorly.  We did a timeout procedure.  I injected local anesthetic along the anterior aspect of his leg.  I made a longitudinal incision and I dissected down through the subcutaneous tissues using cautery and achieving good hemostasis.  I exposed the fascia of the anterior quadriceps.  I incised this sharply exposing the muscle.  I then used cautery to excise a 1 cm x 3.5 cm piece of muscle.  This was sent fresh to pathology in accordance with their instructions.  I then liberally used cautery to get excellent hemostasis in light of the patient's blood thinners.  I closed the fascia with running 3-0 Vicryl.  The area was irrigated and hemostasis was ensured.  The skin was closed with running 4-0 Monocryl subcuticular followed by Dermabond.  Once the Dermabond dried, and Ace bandage was placed.  All counts were correct.  He tolerated the procedure well without apparent complication was taken recovery in stable condition. PATIENT DISPOSITION:  PACU - hemodynamically stable.   Delay start of Pharmacological VTE agent (>24hrs) due to surgical blood loss or risk of bleeding:  no  Georganna Skeans, MD, MPH, FACS Pager: [phone]  4/6/202210:52  AM

## 2020-11-01 NOTE — Interval H&P Note (Signed)
History and Physical Interval Note:  11/01/2020 9:50 AM  Eugene [name]  has presented today for surgery, with the diagnosis of Myositis.  The various methods of treatment have been discussed with the patient and family. After consideration of risks, benefits and other options for treatment, the patient has consented to  Procedure(s): THIGH MUSCLE BIOPSY (N/A) as a surgical intervention.  The patient's history has been reviewed, patient examined, no change in status, stable for surgery.  I have reviewed the patient's chart and labs.  Questions were answered to the patient's satisfaction.    I also spoke with Dr. Forde Dandy Monday and I appreciate the further Cardiology input. Zenovia Jarred

## 2020-11-01 NOTE — Anesthesia Procedure Notes (Signed)
Procedure Name: LMA Insertion Date/Time: 11/01/2020 10:37 AM Performed by: Lavina Hamman, CRNA Pre-anesthesia Checklist: Patient identified, Emergency Drugs available, Suction available and Patient being monitored Patient Re-evaluated:Patient Re-evaluated prior to induction Oxygen Delivery Method: Circle System Utilized Preoxygenation: Pre-oxygenation with 100% oxygen Induction Type: IV induction Ventilation: Mask ventilation without difficulty LMA: LMA inserted LMA Size: 4.0 Number of attempts: 1 Airway Equipment and Method: Bite block Placement Confirmation: positive ETCO2 Tube secured with: Tape Dental Injury: Teeth and Oropharynx as per pre-operative assessment

## 2020-11-02 ENCOUNTER — Encounter (HOSPITAL_COMMUNITY): Payer: Self-pay | Admitting: General Surgery

## 2020-11-02 ENCOUNTER — Other Ambulatory Visit: Payer: Self-pay | Admitting: Internal Medicine

## 2020-11-02 DIAGNOSIS — N390 Urinary tract infection, site not specified: Secondary | ICD-10-CM | POA: Diagnosis not present

## 2020-11-02 DIAGNOSIS — I4901 Ventricular fibrillation: Secondary | ICD-10-CM | POA: Diagnosis not present

## 2020-11-02 DIAGNOSIS — I214 Non-ST elevation (NSTEMI) myocardial infarction: Secondary | ICD-10-CM | POA: Diagnosis not present

## 2020-11-02 DIAGNOSIS — I2510 Atherosclerotic heart disease of native coronary artery without angina pectoris: Secondary | ICD-10-CM | POA: Diagnosis not present

## 2020-11-02 DIAGNOSIS — I82401 Acute embolism and thrombosis of unspecified deep veins of right lower extremity: Secondary | ICD-10-CM | POA: Diagnosis not present

## 2020-11-02 DIAGNOSIS — Z48812 Encounter for surgical aftercare following surgery on the circulatory system: Secondary | ICD-10-CM | POA: Diagnosis not present

## 2020-11-02 DIAGNOSIS — U071 COVID-19: Secondary | ICD-10-CM | POA: Diagnosis not present

## 2020-11-02 DIAGNOSIS — B961 Klebsiella pneumoniae [K. pneumoniae] as the cause of diseases classified elsewhere: Secondary | ICD-10-CM | POA: Diagnosis not present

## 2020-11-02 DIAGNOSIS — I130 Hypertensive heart and chronic kidney disease with heart failure and stage 1 through stage 4 chronic kidney disease, or unspecified chronic kidney disease: Secondary | ICD-10-CM | POA: Diagnosis not present

## 2020-11-02 NOTE — Progress Notes (Signed)
Let [name] know that CK is now normal and that G6PD is  normal. So, when Dr Kathlene November puts him on celllcept is safet to be on bactrim prophylasix

## 2020-11-08 ENCOUNTER — Ambulatory Visit (INDEPENDENT_AMBULATORY_CARE_PROVIDER_SITE_OTHER): Payer: Medicare HMO

## 2020-11-08 DIAGNOSIS — I4901 Ventricular fibrillation: Secondary | ICD-10-CM

## 2020-11-09 DIAGNOSIS — M609 Myositis, unspecified: Secondary | ICD-10-CM | POA: Diagnosis not present

## 2020-11-09 DIAGNOSIS — J849 Interstitial pulmonary disease, unspecified: Secondary | ICD-10-CM | POA: Diagnosis not present

## 2020-11-09 DIAGNOSIS — G7249 Other inflammatory and immune myopathies, not elsewhere classified: Secondary | ICD-10-CM | POA: Diagnosis not present

## 2020-11-09 DIAGNOSIS — Z79899 Other long term (current) drug therapy: Secondary | ICD-10-CM | POA: Diagnosis not present

## 2020-11-09 DIAGNOSIS — I7300 Raynaud's syndrome without gangrene: Secondary | ICD-10-CM | POA: Diagnosis not present

## 2020-11-09 DIAGNOSIS — M1990 Unspecified osteoarthritis, unspecified site: Secondary | ICD-10-CM | POA: Diagnosis not present

## 2020-11-09 DIAGNOSIS — N1832 Chronic kidney disease, stage 3b: Secondary | ICD-10-CM | POA: Diagnosis not present

## 2020-11-09 DIAGNOSIS — I82409 Acute embolism and thrombosis of unspecified deep veins of unspecified lower extremity: Secondary | ICD-10-CM | POA: Diagnosis not present

## 2020-11-09 DIAGNOSIS — I2510 Atherosclerotic heart disease of native coronary artery without angina pectoris: Secondary | ICD-10-CM | POA: Diagnosis not present

## 2020-11-09 LAB — CUP PACEART REMOTE DEVICE CHECK
Battery Remaining Longevity: 25 mo
Battery Remaining Percentage: 22 %
Battery Voltage: 2.78 V
Brady Statistic RV Percent Paced: 1 %
Date Time Interrogation Session: 20220413020019
HighPow Impedance: 70 Ohm
HighPow Impedance: 70 Ohm
Implantable Lead Implant Date: 20120214
Implantable Lead Location: 753860
Implantable Lead Model: 7122
Implantable Pulse Generator Implant Date: 20120214
Lead Channel Impedance Value: 530 Ohm
Lead Channel Pacing Threshold Amplitude: 0.75 V
Lead Channel Pacing Threshold Pulse Width: 0.5 ms
Lead Channel Sensing Intrinsic Amplitude: 11.7 mV
Lead Channel Setting Pacing Amplitude: 2.5 V
Lead Channel Setting Pacing Pulse Width: 0.5 ms
Lead Channel Setting Sensing Sensitivity: 0.5 mV
Pulse Gen Serial Number: 819791

## 2020-11-13 ENCOUNTER — Telehealth: Payer: Self-pay

## 2020-11-13 NOTE — Telephone Encounter (Signed)
[facility] Alert remote transmission for [facility] event appears VT in VF zone average rate 218bpm; ATP and 30J high voltage therapy delivered and successful for conversion to SR.    Pt meds include ASA 81mg , Metoprolol 50mg  daily.  Spoke with pt.  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  He denies any cardiac symptoms following shock.    Reviewed shock plan with pt.  Educated pt on DMV restrictions. Pt v/u of no driving for 6 months.    Advised pt should have f/u visit with MD or APP.

## 2020-11-16 ENCOUNTER — Other Ambulatory Visit: Payer: Self-pay

## 2020-11-16 ENCOUNTER — Encounter: Payer: Self-pay | Admitting: Physician Assistant

## 2020-11-16 ENCOUNTER — Ambulatory Visit: Payer: Medicare HMO | Admitting: Physician Assistant

## 2020-11-16 VITALS — BP 106/64 | HR 78 | Ht 66.0 in | Wt 171.2 lb

## 2020-11-16 DIAGNOSIS — I2510 Atherosclerotic heart disease of native coronary artery without angina pectoris: Secondary | ICD-10-CM | POA: Diagnosis not present

## 2020-11-16 DIAGNOSIS — R9439 Abnormal result of other cardiovascular function study: Secondary | ICD-10-CM

## 2020-11-16 DIAGNOSIS — I509 Heart failure, unspecified: Secondary | ICD-10-CM

## 2020-11-16 DIAGNOSIS — I472 Ventricular tachycardia, unspecified: Secondary | ICD-10-CM

## 2020-11-16 DIAGNOSIS — I5042 Chronic combined systolic (congestive) and diastolic (congestive) heart failure: Secondary | ICD-10-CM | POA: Diagnosis not present

## 2020-11-16 DIAGNOSIS — I480 Paroxysmal atrial fibrillation: Secondary | ICD-10-CM

## 2020-11-16 LAB — CUP PACEART INCLINIC DEVICE CHECK
Battery Remaining Longevity: 24 mo
Brady Statistic RV Percent Paced: 0.03 %
Date Time Interrogation Session: 20220421195412
HighPow Impedance: 64.125
Implantable Lead Implant Date: 20120214
Implantable Lead Location: 753860
Implantable Lead Model: 7122
Implantable Pulse Generator Implant Date: 20120214
Lead Channel Impedance Value: 487.5 Ohm
Lead Channel Pacing Threshold Amplitude: 0.75 V
Lead Channel Pacing Threshold Amplitude: 0.75 V
Lead Channel Pacing Threshold Pulse Width: 0.5 ms
Lead Channel Pacing Threshold Pulse Width: 0.5 ms
Lead Channel Sensing Intrinsic Amplitude: 11.7 mV
Lead Channel Setting Pacing Amplitude: 2.5 V
Lead Channel Setting Pacing Pulse Width: 0.5 ms
Lead Channel Setting Sensing Sensitivity: 0.5 mV
Pulse Gen Serial Number: 819791

## 2020-11-16 MED ORDER — TICAGRELOR 90 MG PO TABS: 90.0000 mg | ORAL_TABLET | Freq: Two times a day (BID) | ORAL | 1 refills | Status: DC

## 2020-11-16 MED ORDER — METOPROLOL SUCCINATE ER 50 MG PO TB24: 50.0000 mg | ORAL_TABLET | Freq: Two times a day (BID) | ORAL | 1 refills | Status: AC

## 2020-11-16 NOTE — Patient Instructions (Addendum)
Medication Instructions:   STOP TAKING ASPIRIN   START TAKING METOPROLOL  SUCCINATE 50 MG TWICE A DAY   START TAKING BRILINTA 90 MG TWICE A DAY   *If you need a refill on your cardiac medications before your next appointment, please call your pharmacy*   Lab Work: NONE ORDERED  TODAY    If you have labs (blood work) drawn today and your tests are completely normal, you will receive your results only by: Marland Kitchen MyChart Message (if you have MyChart) OR . A paper copy in the mail If you have any lab test that is abnormal or we need to change your treatment, we will call you to review the results.   Testing/Procedures: NONE ORDERED  TODAY   Follow-Up: At [facility], you and your health needs are our priority.  As part of our continuing mission to provide you with exceptional heart care, we have created designated Provider Care Teams.  These Care Teams include your primary Cardiologist (physician) and Advanced Practice Providers (APPs -  Physician Assistants and Nurse Practitioners) who all work together to provide you with the care you need, when you need it.  We recommend signing up for the patient portal called "MyChart".  Sign up information is provided on this After Visit Summary.  MyChart is used to connect with patients for Virtual Visits (Telemedicine).  Patients are able to view lab/test results, encounter notes, upcoming appointments, etc.  Non-urgent messages can be sent to your provider as well.   To learn more about what you can do with MyChart, go to [email or web address].    Your next appointment:   2-3 month(s)  The format for your next appointment:   In Person  Provider:   Cristopher Peru, MD   Other Instructions

## 2020-11-16 NOTE — Progress Notes (Signed)
Cardiology Office Note Date:  11/16/2020  Patient ID:  Eugene [name], Eugene [name] 1956-05-25, MRN [id number] PCP:  Eugene Bowen, MD  Cardiologist: Dr. Gasper [name] Electrophysiologist: Dr. Lovena [name]    Chief Complaint:  VT  History of Present Illness: Eugene [name] is a 65 y.o. male with history of VF arrest (2012) w/ICD,  CAD (h/o CABG), chronic CHF (described as systolic), DM, HTN, HLD, TIA/stroke, PVD (note abd aortogram/runn off 2015)   I saw him 07/05/20 He comes today to be seen for Dr. Lovena [name], requiring clearance for EGD/colonoscopy, plavix to be held 5 days.  In effort for completeness, given his hx, pt was called by the preop pool and mentioned that he had been having some CP, SOB new since his visit with Dr. Lovena [name] in January and recommended he be seen prior to his planned procedure. He tells me that he is getting the colonoscopy 2/2 40lb unintentional weight loss over the year and reduced appetite. He since his cath back in 2019 has had some occasional CP, though in the last 3-6 months has noticed increased frequency/severity of his CP.  He states that he has associated SOB with it, is central, pressure that can be very heavy, is non-radiating, no N/V, no diaphoresais. It can occur with minimal exertion some times and will resolve with resting alone, often will require s/l NTG, and occasionally will require 2 doses of NTG to resolve He denies any rest symptoms, no rest SOB, no symptoms of orthopnea or PND He has never been woke with CP  He has not had any CP today, or having any active CP now, he may have had some yesterday, can't remember but if he did for sure did not have to take any NTG yesterday. He tells me for some years, he has chronic b/l hand numbness and b/l [name] numbness from knees down, denies symptoms of claudication. The numbness is not new or different. His EKG was abnormal, escalating anginal symptoms planned for cath asap and referral to general cardiology for CAD PVD and  general cardiology needs. He underwent cath with significant CAD 1. Successful PCI to proximal SVG-OM2 using Resolute Onyx 4.0 x 18 mm drug-eluting stent with 0% residual stenosis and TIMI-3 flow. 2. Successful PCI to OM2 using Resolute Onyx 2.0 x 12 mm drug-eluting stent with 0% residual stenosis and TIMI-3 flow.  He was re-hospitalized 09/08/20 with a week of progressive [name] b/l, weakness to the point of non-ambulatory and episodes of CP as well found with marked HS Trop elevations, AKI, very abn CPKs  Cardiology and neurology participated in his care treated for NSTEMI, asymptomatic COVID + CATH noted in stent stenosis and had balloon angioplasty, plavix changed to [place] for outpt muscle biopsy for weakness and rheumatology follow up tx for UTI as well Noted with new onset Afib and interstitial lung disease discussions on concerns for underlying malignancy given constellation of issues including unintentional cweight loss Discharged 09/16/20 DVT warfarin was added , no ASA given this  He saw Dr. Gasper [name] post hospital.  He had muscle bx done 11/01/20  11/13/20, Device remote with ICD shock  TODAY He reports feeling OK, steady improvement in his [name] strenth, not to baseline. He denies any kind of CP, palpitations or cardiac awareness  No dizzy spells, near syncope or syncope.  He was under the deck installing awater libne for yard inrrigation when he was shocked.  He though he may have accidentally gotten shocked or touched an [name], commercial underground. He  did not appreciate was an ICD shock and felt fine.  He denies any unusual SOB, DOE.  No bleeding or signs of bleeding.  He reports being on ASA and warfarin, stopped Brilinta nopw for a couple weeks. He thinks he may have not taken his metoprolol the day of the ICD shock, had run out and resumed yesterday.   Device information SJM single chamber ICD implanted 09/11/2010 ILR implanted 2016 2/2 cryptogenic stroke,  EOS 12/21/2017  Past Medical History:  Diagnosis Date  . Abnormal nuclear stress test 02/19/2018  . Angina, class III ([place]) 02/19/2018  . Anxiety   . CHF (congestive heart failure) ([place])   . Chronic kidney disease    CKD STAGE IV  . Coronary artery disease   . DDD (degenerative disc disease), cervical 03/01/2013  . Diabetes mellitus    type 2  . 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 DVT (deep vein thrombosis) in pregnancy 09/08/2020  . Dyslipidemia   . Hypertension   . ILD (interstitial lung disease) ([place])   . Lumbosacral radiculopathy at L4 02/18/2017  . Myocardial infarction ([place]) 2012  . Obesity   . Snoring 10/12/2013  . TIA (transient ischemic attack) 08/09/2014  . Tubular adenoma of colon 01/12/2014   transverse colon  . VF (ventricular fibrillation) ([place])    arrest  . Vitamin D deficiency 02/18/2017    Past Surgical History:  Procedure Laterality Date  . ABDOMINAL AORTAGRAM N/A 12/14/2013   Procedure: ABDOMINAL Eugene [name];  Surgeon: [name], MD;  Location: [facility] CATH LAB;  Service: Cardiovascular;  Laterality: N/A;  . CARDIAC CATHETERIZATION  09/16/10  . COLONOSCOPY  01/12/2014  . CORONARY ARTERY BYPASS GRAFT  2006  . CORONARY BALLOON ANGIOPLASTY N/A 09/12/2020   Procedure: CORONARY BALLOON ANGIOPLASTY;  Surgeon: Troy Sine, MD;  Location: [facility] CV LAB;  Service: Cardiovascular;  Laterality: N/A;  . CORONARY STENT INTERVENTION  07/06/2020  . CORONARY STENT INTERVENTION N/A 07/06/2020   Procedure: CORONARY STENT INTERVENTION;  Surgeon: Nelva Bush, MD;  Location: [facility] CV LAB;  Service: Cardiovascular;  Laterality: N/A;  . IMPLANTABLE CARDIOVERTER DEFIBRILLATOR IMPLANT  2012   STJ ICD- single chamber  . LEFT HEART CATH AND CORS/GRAFTS ANGIOGRAPHY N/A 02/19/2018   Procedure: LEFT HEART CATH AND CORS/GRAFTS ANGIOGRAPHY;  Surgeon: [name], MD;  Location: [facility] CV LAB;  Service: Cardiovascular;  Laterality: N/A;  . LEFT HEART CATH AND  CORS/GRAFTS ANGIOGRAPHY N/A 07/06/2020   Procedure: LEFT HEART CATH AND CORS/GRAFTS ANGIOGRAPHY;  Surgeon: Nelva Bush, MD;  Location: [facility] CV LAB;  Service: Cardiovascular;  Laterality: N/A;  . LEFT HEART CATH AND CORS/GRAFTS ANGIOGRAPHY N/A 09/12/2020   Procedure: LEFT HEART CATH AND CORS/GRAFTS ANGIOGRAPHY;  Surgeon: Troy Sine, MD;  Location: [facility] CV LAB;  Service: Cardiovascular;  Laterality: N/A;  . LOOP RECORDER IMPLANT N/A 08/24/2014   Procedure: LOOP RECORDER IMPLANT;  Surgeon: [name], MD;  Location: [facility] CATH LAB;  Service: Cardiovascular;  Laterality: N/A;  . MASS EXCISION  2008   right shoulder  . MUSCLE BIOPSY Left 11/01/2020   Procedure: THIGH MUSCLE BIOPSY;  Surgeon: Georganna Skeans, MD;  Location: [facility] ORS;  Service: General;  Laterality: Left;  . STERNECTOMY      Current Outpatient Medications  Medication Sig Dispense Refill  . aspirin EC 81 MG tablet Take 81 mg by mouth daily. Swallow whole.    Marland Kitchen atorvastatin (LIPITOR) 80 MG tablet TAKE 1 TABLET (80 MG TOTAL) BY MOUTH DAILY.  30 tablet 0  . dapagliflozin propanediol (FARXIGA) 10 MG TABS tablet Take 10 mg by mouth at bedtime.    . enoxaparin (LOVENOX) 80 MG/0.8ML injection INJECT 0.8 MLS (80 MG TOTAL) INTO THE SKIN DAILY. 8 mL 10  . Evolocumab (REPATHA SURECLICK) 130 MG/ML SOAJ Inject 1 pen into the skin every 14 (fourteen) days. 2 mL 11  . furosemide (LASIX) 40 MG tablet Take 1 tablet (40 mg total) by mouth daily. 90 tablet 3  . insulin aspart (NOVOLOG) 100 UNIT/ML injection Substitute to any brand approved.Before each meal 3 times a day, 140-199 - 2 units, 200-250 - 4 units, 251-299 - 6 units,  300-349 - 8 units,  350 or above 10 units. Dispense syringes and needles as needed, Ok to switch to PEN if approved. DX DM2, Code E11.65 (Patient taking differently: Inject 2-10 Units into the skin 3 (three) times daily as needed for high blood sugar. Substitute to any brand approved.Before each meal 3 times a day,  140-199 - 2 units, 200-250 - 4 units, 251-299 - 6 units,  300-349 - 8 units,  350 or above 10 units. Dispense syringes and needles as needed, Ok to switch to PEN if approved. DX DM2, Code E11.65) 10 mL 0  . insulin glargine (LANTUS) 100 UNIT/ML Solostar Pen Inject 70 Units into the skin daily before breakfast.    . Insulin Syringe-Needle U-100 25G X 1" 1 ML MISC For 4 times a day insulin SQ, 1 month supply. Diagnosis E11.65 30 each 0  . metoprolol succinate (TOPROL-XL) 50 MG 24 hr tablet TAKE ONE TABLET BY MOUTH ONE TIME DAILY. TAKE WITH OR IMMEDIATELY FOLLOWING A MEAL (Patient taking differently: Take 50 mg by mouth daily.) 90 tablet 1  . mycophenolate (CELLCEPT) 500 MG tablet Take 500 mg by mouth 2 (two) times daily.    . nitroGLYCERIN (NITROSTAT) 0.4 MG SL tablet PLACE 1 TABLET UNDER THE TONGUE EVERY 5 MINUTES AS NEEDED FOR CHEST PAIN 25 tablet 0  . oxyCODONE (OXY IR/ROXICODONE) 5 MG immediate release tablet Take 1 tablet (5 mg total) by mouth every 6 (six) hours as needed for severe pain. 15 tablet 0  . pantoprazole (PROTONIX) 40 MG tablet Take 40 mg by mouth daily before breakfast.    . predniSONE (DELTASONE) 5 MG tablet 8 Pills PO for 3 days,6 Pills PO for 3 days,4 Pills PO for 3 days,2 Pills PO for 3 days,1 Pills PO for 3 days,1/2 Pill  PO for 3 days then STOP. (Patient taking differently: Take 5-40 mg by mouth See admin instructions. Take 40 mg daily for 1 week, then reduce by 5 mg every week (35 x 1 week, 30 x 1 week, 25 x 1 week, ...Marland KitchenMarland Kitchen) until gone) 65 tablet 0  . ranolazine (RANEXA) 500 MG 12 hr tablet TAKE ONE TABLET BY MOUTH TWICE DAILY.PLEASE MAKE APPT WITH DR.HARDING FOR REFILLS.  (Patient taking differently: Take 500 mg by mouth 2 (two) times daily.) 60 tablet 2  . Semaglutide, 1 MG/DOSE, (OZEMPIC, 1 MG/DOSE,) 4 MG/3ML SOPN Inject 1 mg into the skin every Friday.    . ticagrelor (BRILINTA) 90 MG TABS tablet Take 1 tablet (90 mg total) by mouth 2 (two) times daily. 180 tablet 3  .  ticagrelor (BRILINTA) 90 MG TABS tablet TAKE 1 TABLET (90 MG TOTAL) BY MOUTH TWO TIMES DAILY. 60 tablet 0  . vitamin B-12 (CYANOCOBALAMIN) 1000 MCG tablet Take 1 tablet (1,000 mcg total) by mouth daily. 30 tablet 0  . vitamin B-12 (  CYANOCOBALAMIN) 1000 MCG tablet TAKE 1 TABLET (1,000 MCG TOTAL) BY MOUTH DAILY. 30 tablet 0  . warfarin (COUMADIN) 5 MG tablet Take 1 tablet (5 mg total) by mouth daily at 4 PM. (Patient taking differently: Take 2.5-5 mg by mouth See admin instructions. Take 5 mg daily except Sunday take 2.5 mg) 7 tablet 0  . warfarin (COUMADIN) 5 MG tablet TAKE 1 TABLET (5 MG TOTAL) BY MOUTH DAILY AT 4 PM. 7 tablet 0   No current facility-administered medications for this visit.    Allergies:   Patient has no known allergies.   Social History:  The patient  reports that he has never smoked. He has never used smokeless tobacco. He reports that he does not drink alcohol and does not use drugs.   Family History:  The patient's family history includes Breast cancer (age of onset: 43) in his mother; Coronary artery disease in his father; Diabetes in his father and sister; Glaucoma in his mother; Heart disease in his brother; Hypertension in his father; Prostate cancer in his brother.  ROS:  Please see the history of present illness.    All other systems are reviewed and otherwise negative.   PHYSICAL EXAM:  VS:  There were no vitals taken for this visit. BMI: There is no height or weight on file to calculate BMI. Well nourished, well developed, in no acute distress HEENT: normocephalic, atraumatic Neck: no JVD, carotid bruits or masses Cardiac:  RRR; no significant murmurs, no rubs, or gallops Lungs:  CTA b/l, no wheezing, rhonchi or rales Abd: soft, nontender MS: no deformity or atrophy Ext: no edema Skin: warm and dry, no rash Neuro:  No gross deficits appreciated Psych: euthymic mood, full affect  ICD  Site is stable, no tethering or discomfort   EKG:  Done today and  reviewed by myself shows  SR Q V1-2 EKG appears unchaged  Device interrogation done today and reviewed by myself:  Battery and lead measurements are good Arrhythmia episode noted By morphology appears same or very similar to baseline thuough did have some change eventually intermittently and somewhat irregular I suspect Afib w/RVR with perhaps dual tachycardia ATP failed ICD converted  09/14/20: TTE IMPRESSIONS  1. Left ventricular ejection fraction, by estimation, is 45 to 50%. The  left ventricle has mildly decreased function. The left ventricle  demonstrates regional wall motion abnormalities (see scoring  diagram/findings for description).  2. Right ventricular systolic function is normal. The right ventricular  size is normal.  3. The mitral valve is normal in structure. Trivial mitral valve  regurgitation. No evidence of mitral stenosis.  4. The aortic valve is grossly normal. There is mild calcification of the  aortic valve. There is mild thickening of the aortic valve. Aortic valve  regurgitation is not visualized. No aortic stenosis is present.   Comparison(s): Prior images reviewed side by side.   Conclusion(s)/Recommendation(s): Llimited study for EF. Patient is now in  atrial fibrillation. The EF is mildly reduced, with slight focal wall  motion abnormalities as noted. Prior images viewed side by side. The  pattern is very similar to prior, but  better seen with definity contrast on current study. Findings discussed  with Dr. Margaretann Loveless.    09/12/20: LHC/PCI  Prox LAD lesion is 99% stenosed.  Ost LAD to Prox LAD lesion is 100% stenosed.  Mid LAD lesion is 80% stenosed.  1st Mrg lesion is 99% stenosed.  Ost Cx to Prox Cx lesion is 80% stenosed.  Mid Cx  lesion is 100% stenosed.  Balloon angioplasty was performed.  Prox RCA to Dist RCA lesion is 100% stenosed with 100% stenosed side branch in RPAV.  Ost Ramus lesion is 100% stenosed.  SVG and is normal in  caliber.  Previously placed Prox Graft drug eluting stent is widely patent.  LIMA and is large.  The graft exhibits no disease.  SVG and is normal in caliber.  Origin to Prox Graft lesion is 100% stenosed.  SVG and is large.  Prox Graft to Dist Graft lesion is 100% stenosed.  Dist Cx lesion is 95% stenosed.  Mid LM to Dist LM lesion is 80% stenosed.  Post intervention, there is a 20% residual stenosis.   Severe native coronary obstructive disease 80% distal left main stenosis, total occlusion of the LAD, ramus immediate vessel, high-grade circumflex stenoses, total occlusion of the proximal RCA.  There was a shunt to the distal RCA via left to right and right to right collateralization.  Patent LIMA graft supplying the mid LAD with diffuse disease in the LAD after insertion.  Supplying the OM 2 vessel with widely patent stent in the proximal portion of the SVG.  There is 95% in-stent restenosis in the native OM 2 vessel beyond the anastomosis.  Old occlusion of the vein graft which had supplied the ramus vessel.  Old occlusion of the vein graft which had supplied the PDA.  Difficult but ultimately successful intervention through the SVG to the distal OM 2 vessel requiring PTCA, [name] Cutting Balloon, and ultimate noncompliant balloon dilatation with a 95% stenosis being reduced to less than 20%.  LVEDP: 11 mmHg  RECOMMENDATION: DAPT probably indefinitely.  Since the patient developed early in-stent restenosis on aspirin/Plavix, Brilinta was utilized for more aggressive antiplatelet therapy.  Medical therapy for concomitant CAD.  Optimal blood pressure control, diabetes management, and aggressive lipid therapy with target LDL less than 70.    Cath: 07/06/20 Conclusions: 1. Severe native coronary artery disease, including chronic total occlusions of the ostial LAD and proximal RCA. Severe diffuse mid/distal LAD and LCx disease is also present. 2. Widely patent  LIMA-LAD. 3. Patent SVG-OM2 with 70% proximal graft stenosis and 95% OM2 lesion distal to SVG anastomosis. 4. Known chronic total occlusions of SVG-ramus and SVG-rPDA (not engaged on today's study). 5. Normal left ventricular filling pressure. 6. Successful PCI to proximal SVG-OM2 using Resolute Onyx 4.0 x 18 mm drug-eluting stent with 0% residual stenosis and TIMI-3 flow. 7. Successful PCI to OM2 using Resolute Onyx 2.0 x 12 mm drug-eluting stent with 0% residual stenosis and TIMI-3 flow.  Recommendations: 1. Overnight post-cath recovery and gentle hydration in the setting of chronic kidney disease. 2. Continue indefinite DAPT with aspirin and clopidogrel. 3. Aggressive secondary prevention and medical management of residual coronary artery disease. There are no other targets for revascularization.  Nelva Bush, MD [facility]   02/19/2018; LHC  Ost LAD to Prox LAD lesion is 100% stenosed. Prox LAD lesion is 99% stenosed.  Ost Ramus lesion is 100% stenosed.  Ost Cx to Prox Cx lesion is 80% stenosed. Mid Cx lesion is 100% stenosed. 1st Mrg lesion is 99% stenosed.  Prox RCA to Dist RCA lesion is 100% stenosed with 100% stenosed side branch in Post Atrio.  GRAFTS: 2 OF 4 PATENT  LIMA-LAD graft was visualized by angiography and is large. The graft exhibits no disease. The distal LAD is very small caliber and diffusely diseased. Mid LAD lesion is 80% stenosed.  SVG-RI graft was visualized  by angiography and is normal in caliber. Prox Graft to Dist Graft lesion is 100% stenosed. There is persistent contrast staining from retrograde filling after injecting the OM graft. Not likely salvageable  SVG-dCx graft was visualized by angiography and is large. Prox Graft lesion is 45% stenosed. SVG graft was visualized by angiography and is normal in caliber.  SVG-RCA was visualized by angiography and is normal in caliber. Origin to Prox Graft lesion is 100% stenosed.   1. Severe Native  CAD with CTO of pRCA, Ost LAD, RI & dCx wtih diffuse Cx-AVGCx disease. 2. Patent LIMA-dLAD (distal LAD has severe diffuse disease & is small caliber - NOT PCI amenable 3. Patent SVG-dCx with ~40% focal lesion in graft; small-moderate dCx with ~2 OM branches 4. CTO of SVG-RCA (known) wtih NEW CTO SVG-RI. 5. Preserved LVEF with normal LVEDP.  Plan: Patient will return to short stay holding area for TR band removal.  Will discharge home today with plan to optimize medical management. I will add Ranexa 500 mg twice daily for antianginal benefit given his borderline blood pressures and continue his current medications.  Recommend Aspirin 81mg  daily for severe CAD   Recent Labs: 08/04/2020: NT-Pro BNP 2,329 09/09/2020: TSH 2.186 09/14/2020: Magnesium 2.3 09/16/2020: ALT 155; B Natriuretic Peptide 1,504.0 10/26/2020: BUN 44; Creatinine, Ser 1.97; Hemoglobin 14.5; Platelets 234; Potassium 5.4; Sodium 136  09/09/2020: Cholesterol 117; HDL 29; LDL Cholesterol 46; Total CHOL/HDL Ratio 4.0; Triglycerides 211; VLDL 42   CrCl cannot be calculated (Unknown ideal weight.).   Wt Readings from Last 3 Encounters:  10/26/20 164 lb (74.4 kg)  10/24/20 167 lb 2 oz (75.8 kg)  09/21/20 164 lb 3.2 oz (74.5 kg)     Other studies reviewed: Additional studies/records reviewed today include: summarized above  ASSESSMENT AND PLAN:  1. ICD     Intact function, no changes made  2. VF arrest hx     Suspect perhaps a dual tachycardia with rapid AF/VT     Home BP 110's-60's  He may or may not have missed his metoprolol the day of his arrhythmia I think by his home BPs we have room for more BB and will increase to 50mg  BID  Discussed no driving 57mo  3. Chronic CHF      09/14/20 EF  45-50% (in AF)     09/09/20 EF 50-55%, grade I DD     No symptoms or exam findings to suggest volume OL       4. HTN     Relative hypotension  5. CAD     As above, severe with prior CABG,  CAD w/PCI 07/06/20 > instent stenosis  balloon aginoplasty 09/12/20      He comes in today on ASA and warfarin They report PMD managing warfarin and INR has been checked  6. New PAFib     CHADS2Vasc is at least 6, on warfarin     (also had DVT his last hospital stay)   Discussed with Dr. Gasper [name] here today, stop ASA and resume brilinta, agrees with titration of his BB   Disposition: he has cardiology f/u in may will have him see Dr. Lovena [name] in 57mo, sooner if needed   Current medicines are reviewed at length with the patient today.  The patient did not have any concerns regarding medicines.  Venetia Night, PA-C 11/16/2020 6:01 AM     [facility] [address] [place] [address] [phone] (office)  [phone] (fax)

## 2020-11-22 DIAGNOSIS — Z794 Long term (current) use of insulin: Secondary | ICD-10-CM | POA: Diagnosis not present

## 2020-11-22 DIAGNOSIS — E0821 Diabetes mellitus due to underlying condition with diabetic nephropathy: Secondary | ICD-10-CM | POA: Diagnosis not present

## 2020-11-22 DIAGNOSIS — I700 Atherosclerosis of aorta: Secondary | ICD-10-CM | POA: Diagnosis not present

## 2020-11-22 DIAGNOSIS — Z95810 Presence of automatic (implantable) cardiac defibrillator: Secondary | ICD-10-CM | POA: Diagnosis not present

## 2020-11-22 DIAGNOSIS — E785 Hyperlipidemia, unspecified: Secondary | ICD-10-CM | POA: Diagnosis not present

## 2020-11-22 DIAGNOSIS — N184 Chronic kidney disease, stage 4 (severe): Secondary | ICD-10-CM | POA: Diagnosis not present

## 2020-11-22 DIAGNOSIS — E1159 Type 2 diabetes mellitus with other circulatory complications: Secondary | ICD-10-CM | POA: Diagnosis not present

## 2020-11-22 DIAGNOSIS — E1351 Other specified diabetes mellitus with diabetic peripheral angiopathy without gangrene: Secondary | ICD-10-CM | POA: Diagnosis not present

## 2020-11-22 DIAGNOSIS — I2510 Atherosclerotic heart disease of native coronary artery without angina pectoris: Secondary | ICD-10-CM | POA: Diagnosis not present

## 2020-11-23 DIAGNOSIS — I5189 Other ill-defined heart diseases: Secondary | ICD-10-CM | POA: Diagnosis not present

## 2020-11-23 DIAGNOSIS — Z794 Long term (current) use of insulin: Secondary | ICD-10-CM | POA: Diagnosis not present

## 2020-11-23 DIAGNOSIS — I2510 Atherosclerotic heart disease of native coronary artery without angina pectoris: Secondary | ICD-10-CM | POA: Diagnosis not present

## 2020-11-23 DIAGNOSIS — E1159 Type 2 diabetes mellitus with other circulatory complications: Secondary | ICD-10-CM | POA: Diagnosis not present

## 2020-11-23 DIAGNOSIS — N184 Chronic kidney disease, stage 4 (severe): Secondary | ICD-10-CM | POA: Diagnosis not present

## 2020-11-23 NOTE — Progress Notes (Signed)
Remote ICD transmission.   

## 2020-11-28 ENCOUNTER — Encounter (HOSPITAL_COMMUNITY): Payer: Self-pay

## 2020-11-28 LAB — SURGICAL PATHOLOGY

## 2020-12-12 NOTE — Progress Notes (Deleted)
Cardiology Office Note    Date:  12/12/2020   ID:  Fard, Borunda [birth year], MRN [id number]   PCP:  [name], [place]  Cardiologist:  Werner Lean, MD *** Advanced Practice Provider:  No care team member to display Electrophysiologist:  None   [phone]   No chief complaint on file.   History of Present Illness:  NICOLAS BANH is a 65 y.o. male with a hx of CAD (distant CABG, CTO oLAD and pRCA. Severe diffuse mid/distal LAD and LCx disease, patent LIMA-LAD, SVG-OM2 with 70% proximal graft stenosis and 95% OM2 lesion distal to SVG anastomosis., CTO SVG-ramus and SVG-rPDA with 12/21 PCI to proximal SVG-OM2; PCI to OM2), HFpEF, DM with HTN, HLD, Prior Stroke, PAD with prior cath and no intervention (smal vessels 2016), Prior VF Arrest s/p St. Jude ICD seen by Dr. Lovena Le   Patient was hospitalized 08/2020 with lower extremity weakness to the point of not ambulating episodes of chest pain marked elevation of high-speed troponins, AKI and very abnormal CPKs.  He had asymptomatic COVID positive.  He was treated for an NSTEMI and cath noted in-stent restenosis and underwent balloon angioplasty.  Plavix was changed to Brilinta.  The with plans for outpatient muscle biopsy.  He also had new onset A. fib and ILD.  He also had a DVT and Coumadin was added.  No aspirin was given.  Patient saw Dr. Ezra Sites 09/21/2020 at which time rheumatology called to discuss his care and given his transaminitis and unclear myalgias atorvastatin was stopped.  Referred to lipid clinic for possible PCSK9 inhibitors.  Because of DVT and Cutting Balloon intervention plan was for triple therapy for 1 month post discharge.  Patient saw Tommye Standard because of defibrillator firing suspected dual tachycardia with rapid A. fib/VT and he had missed his metoprolol dose that day.   Past Medical History:  Diagnosis Date  . Abnormal nuclear stress test 02/19/2018  .  Angina, class III ([place]) 02/19/2018  . Anxiety   . CHF (congestive heart failure) ([place])   . Chronic kidney disease    CKD STAGE IV  . Coronary artery disease   . DDD (degenerative disc disease), cervical 03/01/2013  . Diabetes mellitus    type 2  . 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 DVT (deep vein thrombosis) in pregnancy 09/08/2020  . Dyslipidemia   . Hypertension   . ILD (interstitial lung disease) ([place])   . Lumbosacral radiculopathy at L4 02/18/2017  . Myocardial infarction ([place]) 2012  . Obesity   . Snoring 10/12/2013  . TIA (transient ischemic attack) 08/09/2014  . Tubular adenoma of colon 01/12/2014   transverse colon  . VF (ventricular fibrillation) ([place])    arrest  . Vitamin D deficiency 02/18/2017    Past Surgical History:  Procedure Laterality Date  . ABDOMINAL AORTAGRAM N/A 12/14/2013   Procedure: ABDOMINAL Maxcine Ham;  Surgeon: [name], MD;  Location: [facility] CATH LAB;  Service: Cardiovascular;  Laterality: N/A;  . CARDIAC CATHETERIZATION  09/16/10  . COLONOSCOPY  01/12/2014  . CORONARY ARTERY BYPASS GRAFT  2006  . CORONARY BALLOON ANGIOPLASTY N/A 09/12/2020   Procedure: CORONARY BALLOON ANGIOPLASTY;  Surgeon: Troy Sine, MD;  Location: [facility] LAB;  Service: Cardiovascular;  Laterality: N/A;  . CORONARY STENT INTERVENTION  07/06/2020  . CORONARY STENT INTERVENTION N/A 07/06/2020   Procedure: CORONARY STENT INTERVENTION;  Surgeon: Nelva Bush, MD;  Location: [facility]  LAB;  Service: Cardiovascular;  Laterality: N/A;  . IMPLANTABLE CARDIOVERTER DEFIBRILLATOR IMPLANT  2012   STJ ICD- single chamber  . LEFT HEART CATH AND CORS/GRAFTS ANGIOGRAPHY N/A 02/19/2018   Procedure: LEFT HEART CATH AND CORS/GRAFTS ANGIOGRAPHY;  Surgeon: [name], MD;  Location: [facility] LAB;  Service: Cardiovascular;  Laterality: N/A;  . LEFT HEART CATH AND CORS/GRAFTS ANGIOGRAPHY N/A 07/06/2020   Procedure: LEFT HEART CATH AND CORS/GRAFTS ANGIOGRAPHY;  Surgeon:  Nelva Bush, MD;  Location: [facility] LAB;  Service: Cardiovascular;  Laterality: N/A;  . LEFT HEART CATH AND CORS/GRAFTS ANGIOGRAPHY N/A 09/12/2020   Procedure: LEFT HEART CATH AND CORS/GRAFTS ANGIOGRAPHY;  Surgeon: Troy Sine, MD;  Location: [facility] LAB;  Service: Cardiovascular;  Laterality: N/A;  . LOOP RECORDER IMPLANT N/A 08/24/2014   Procedure: LOOP RECORDER IMPLANT;  Surgeon: [name], MD;  Location: [facility] CATH LAB;  Service: Cardiovascular;  Laterality: N/A;  . MASS EXCISION  2008   right shoulder  . MUSCLE BIOPSY Left 11/01/2020   Procedure: THIGH MUSCLE BIOPSY;  Surgeon: Georganna Skeans, MD;  Location: [facility] ORS;  Service: General;  Laterality: Left;  . STERNECTOMY      Current Medications: No outpatient medications have been marked as taking for the 12/19/20 encounter (Appointment) with Imogene Burn, PA-C.     Allergies:   Patient has no known allergies.   Social History   Socioeconomic History  . Marital status: Single    Spouse name: Not on file  . Number of children: 0  . Years of education: Not on file  . Highest education level: Not on file  Occupational History  . Occupation: STORE Programme researcher, broadcasting/film/video: [employer]    Comment: [place], [place]  Tobacco Use  . Smoking status: Never Smoker  . Smokeless tobacco: Never Used  Vaping Use  . Vaping Use: Never used  Substance and Sexual Activity  . Alcohol use: No  . Drug use: No  . Sexual activity: Not on file  Other Topics Concern  . 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   ROS:   Please see the history of present illness.    ROS All other systems reviewed and are negative.   PHYSICAL EXAM:   VS:  There were no vitals taken for this visit.  Physical Exam  GEN: Well nourished, well developed, in no acute distress  HEENT: normal  Neck: no JVD, carotid bruits, or masses Cardiac:RRR; no murmurs, rubs, or gallops  Respiratory:  clear to auscultation bilaterally, normal work of breathing GI: soft, nontender, nondistended, + BS Ext: without cyanosis, clubbing, or edema, Good distal pulses bilaterally MS: no deformity or atrophy  Skin: warm and dry, no rash Neuro:  Alert and Oriented x 3, Strength and sensation are intact  Psych: euthymic mood, full affect  Wt Readings from Last 3 Encounters:  11/16/20 171 lb 3.2 oz (77.7 kg)  10/26/20 164 lb (74.4 kg)  10/24/20 167 lb 2 oz (75.8 kg)      Studies/Labs Reviewed:   EKG:  EKG is*** ordered today.  The ekg ordered today demonstrates ***  Recent Labs: 08/04/2020: NT-Pro BNP 2,329 09/09/2020: TSH 2.186 09/14/2020: Magnesium 2.3 09/16/2020: ALT 155; B Natriuretic Peptide 1,504.0 10/26/2020: BUN 44; Creatinine, Ser 1.97; Hemoglobin 14.5; Platelets 234; Potassium 5.4; Sodium 136   Lipid Panel    Component Value Date/Time   CHOL 117 09/09/2020 0409   CHOL 140 05/26/2018 0813   TRIG 211 (H) 09/09/2020 0409   HDL 29 (L) 09/09/2020 0409   HDL 26 (L) 05/26/2018 0813   CHOLHDL 4.0 09/09/2020 0409   VLDL 42 (H) 09/09/2020 0409   LDLCALC 46 09/09/2020 0409   LDLCALC 54 05/26/2018 0813    Additional studies/ records that were reviewed today include:  09/14/20: TTE IMPRESSIONS   1. Left ventricular ejection fraction, by estimation, is 45 to 50%. The  left ventricle has mildly decreased function. The  left ventricle  demonstrates regional wall motion abnormalities (see scoring  diagram/findings for description).   2. Right ventricular systolic function is normal. The right ventricular  size is normal.   3. The mitral valve is normal in structure. Trivial mitral valve  regurgitation. No evidence of mitral stenosis.   4. The aortic valve is grossly normal. There is mild calcification of the  aortic valve. There is mild thickening of the aortic valve. Aortic valve  regurgitation is not visualized. No aortic stenosis is present.   Comparison(s): Prior images reviewed side by side.   Conclusion(s)/Recommendation(s): Llimited study for EF. Patient is now in  atrial fibrillation. The EF is mildly reduced, with slight focal wall  motion abnormalities as noted. Prior images viewed side by side. The  pattern is very similar to prior, but  better seen with definity contrast on current study. Findings discussed  with Dr. Margaretann Loveless.      09/12/20: LHC/PCI  Prox LAD lesion is 99% stenosed.  Ost LAD to Prox LAD lesion is 100% stenosed.  Mid LAD lesion is 80% stenosed.  1st Mrg lesion is 99% stenosed.  Ost Cx to Prox Cx lesion is 80% stenosed.  Mid Cx lesion is 100% stenosed.  Balloon angioplasty was performed.  Prox RCA to Dist RCA lesion is 100% stenosed with 100% stenosed side branch in RPAV.  Ost Ramus lesion is 100% stenosed.  SVG and is normal in caliber.  Previously placed Prox Graft drug eluting stent is widely patent.  LIMA and is large.  The graft exhibits no disease.  SVG and is normal in caliber.  Origin to Prox Graft lesion is 100% stenosed.  SVG and is large.  Prox Graft to Dist Graft lesion is 100% stenosed.  Dist Cx lesion is 95% stenosed.  Mid LM to Dist LM lesion is 80% stenosed.  Post intervention, there is a 20% residual stenosis.   Severe native coronary obstructive disease 80% distal left main stenosis, total occlusion of the LAD, ramus immediate  vessel, high-grade circumflex stenoses, total occlusion of the proximal RCA.  There was a shunt to the distal RCA via left to right and right to right collateralization.   Patent LIMA graft supplying the mid LAD with diffuse disease in the LAD after insertion.   Supplying the OM 2 vessel with widely patent stent in the proximal  portion of the SVG.  There is 95% in-stent restenosis in the native OM 2 vessel beyond the anastomosis.   Old occlusion of the vein graft which had supplied the ramus vessel.   Old occlusion of the vein graft which had supplied the PDA.   Difficult but ultimately successful intervention through the SVG to the distal OM 2 vessel requiring PTCA, [name] Cutting Balloon, and ultimate noncompliant balloon dilatation with a 95% stenosis being reduced to less than 20%.   LVEDP: 11 mmHg   RECOMMENDATION: DAPT probably indefinitely.  Since the patient developed early in-stent restenosis on aspirin/Plavix, Brilinta was utilized for more aggressive antiplatelet therapy.  Medical therapy for concomitant CAD.  Optimal blood pressure control, diabetes management, and aggressive lipid therapy with target LDL less than 70.       Cath: 07/06/20 Conclusions: 1. Severe native coronary artery disease, including chronic total occlusions of the ostial LAD and proximal RCA.  Severe diffuse mid/distal LAD and LCx disease is also present. 2. Widely patent LIMA-LAD. 3. Patent SVG-OM2 with 70% proximal graft stenosis and 95% OM2 lesion distal to SVG anastomosis. 4. Known chronic total occlusions of SVG-ramus and SVG-rPDA (not engaged on today's study). 5. Normal left ventricular filling pressure. 6. Successful PCI to proximal SVG-OM2 using Resolute Onyx 4.0 x 18 mm drug-eluting stent with 0% residual stenosis and TIMI-3 flow. 7. Successful PCI to OM2 using Resolute Onyx 2.0 x 12 mm drug-eluting stent with 0% residual stenosis and TIMI-3 flow.   Recommendations: 1. Overnight post-cath  recovery and gentle hydration in the setting of chronic kidney disease. 2. Continue indefinite DAPT with aspirin and clopidogrel. 3. Aggressive secondary prevention and medical management of residual coronary artery disease.  There are no other targets for revascularization.   Nelva Bush, MD [facility]     02/19/2018; LHC  Ost LAD to Prox LAD lesion is 100% stenosed. Prox LAD lesion is 99% stenosed.  Ost Ramus lesion is 100% stenosed.  Ost Cx to Prox Cx lesion is 80% stenosed. Mid Cx lesion is 100% stenosed. 1st Mrg lesion is 99% stenosed.  Prox RCA to Dist RCA lesion is 100% stenosed with 100% stenosed side branch in Post Atrio.  GRAFTS: 2 OF 4 PATENT  LIMA-LAD graft was visualized by angiography and is large. The graft exhibits no disease. The distal LAD is very small caliber and diffusely diseased. Mid LAD lesion is 80% stenosed.  SVG-RI graft was visualized by angiography and is normal in caliber. Prox Graft to Dist Graft lesion is 100% stenosed. There is persistent contrast staining from retrograde filling after injecting the OM graft. Not likely salvageable  SVG-dCx graft was visualized by angiography and is large. Prox Graft lesion is 45% stenosed. SVG graft was visualized by angiography and is normal in caliber.  SVG-RCA was visualized by angiography and is normal in caliber. Origin to Prox Graft lesion is 100% stenosed.   1. Severe Native CAD with CTO of pRCA, Ost LAD, RI & dCx wtih diffuse Cx-AVGCx disease. 2. Patent LIMA-dLAD (distal LAD has severe diffuse disease & is small caliber - NOT PCI amenable 3. Patent SVG-dCx with ~40% focal lesion in graft; small-moderate dCx with ~2 OM branches 4. CTO of SVG-RCA (known) wtih NEW CTO SVG-RI. 5. Preserved LVEF with normal LVEDP.   Plan: Patient will return to short stay holding area for TR band removal.  Will discharge home today with plan to optimize medical management. I will add Ranexa 500 mg twice daily for  antianginal benefit  given his borderline blood pressures and continue his current medications.   Recommend Aspirin 81mg  daily for severe CAD        Risk Assessment/Calculations:   {Does this patient have ATRIAL FIBRILLATION?[phone]}     ASSESSMENT:    No diagnosis found.   PLAN:  In order of problems listed above: CAD status post remote CABG with interventions as above.  Recent NSTEMI with Cutting Balloon 09/12/2020 on triple therapy for 3 months because of DVT.  Statin stopped because of transaminitis and muscle weakness  VT status post ICD  PAF  Hypertension  DVT HLD   Shared Decision Making/Informed Consent   {Are you ordering a CV Procedure (e.g. stress test, cath, DCCV, TEE, etc)?   Press F2        :[id number]}    Medication Adjustments/Labs and Tests Ordered: Current medicines are reviewed at length with the patient today.  Concerns regarding medicines are outlined above.  Medication changes, Labs and Tests ordered today are listed in the Patient Instructions below. There are no Patient Instructions on file for this visit.   Sumner Boast, PA-C  12/12/2020 3:23 PM    [facility] [address], [place], [place]  [address] Phone: [phone]; Fax: [phone]

## 2020-12-19 ENCOUNTER — Ambulatory Visit: Payer: Medicare HMO | Admitting: Physician Assistant

## 2020-12-22 ENCOUNTER — Ambulatory Visit (INDEPENDENT_AMBULATORY_CARE_PROVIDER_SITE_OTHER): Payer: Medicare HMO

## 2020-12-22 ENCOUNTER — Other Ambulatory Visit: Payer: Self-pay | Admitting: Podiatry

## 2020-12-22 ENCOUNTER — Encounter: Payer: Self-pay | Admitting: Podiatry

## 2020-12-22 ENCOUNTER — Ambulatory Visit: Payer: Medicare HMO | Admitting: Podiatry

## 2020-12-22 ENCOUNTER — Other Ambulatory Visit: Payer: Self-pay

## 2020-12-22 VITALS — BP 135/87 | HR 89 | Temp 98.2°F

## 2020-12-22 DIAGNOSIS — L97421 Non-pressure chronic ulcer of left heel and midfoot limited to breakdown of skin: Secondary | ICD-10-CM | POA: Diagnosis not present

## 2020-12-22 DIAGNOSIS — M722 Plantar fascial fibromatosis: Secondary | ICD-10-CM

## 2020-12-22 DIAGNOSIS — I999 Unspecified disorder of circulatory system: Secondary | ICD-10-CM | POA: Diagnosis not present

## 2020-12-22 NOTE — Progress Notes (Signed)
Subjective:   Patient ID: Eugene [name], male   DOB: 65 y.o.   MRN: [id number]   HPI Patient presents with concerns about a painful lesion on the plantar posterior aspect of the left heel with patient in poor health who had heart attack and appears to have vascular disease and is moderately obese does not smoke and is not currently active but needs to be   Review of Systems  All other systems reviewed and are negative.       Objective:  Physical Exam Vitals and nursing note reviewed.  Constitutional:      Appearance: He is well-developed.  Pulmonary:     Effort: Pulmonary effort is normal.  Musculoskeletal:        General: Normal range of motion.  Skin:    General: Skin is warm.  Neurological:     Mental Status: He is alert.     Vascular status significantly reduced with no palpable PT DP pulses with neurological mildly reduced and reduced range of motion and also muscle strength.  Patient does have a approximate 3 x 3 mm posterior plantar aspect of the left heel crusted over no active drainage noted no erythema surrounding the area     Assessment:  Probability for a small arterial lesion plantar posterior left heel localized no indication of infection     Plan:  H&P precautionary x-ray taken and I advised this patient on seeing his cardiologist who has regular for arterial exam and he may have already had it done so I did not order it and I will get a ghost have him see him to determine whether or not he is have this performed.  Right now it is not a crisis stage we did dispense a fleece booty to take pressure off the heel and if any opening were to occur he is to reappoint and let us know immediately  X-rays did not indicate bony infection appears to be soft tissue no spurring noted

## 2020-12-27 DIAGNOSIS — N1832 Chronic kidney disease, stage 3b: Secondary | ICD-10-CM | POA: Diagnosis not present

## 2020-12-27 DIAGNOSIS — I7300 Raynaud's syndrome without gangrene: Secondary | ICD-10-CM | POA: Diagnosis not present

## 2020-12-27 DIAGNOSIS — I82409 Acute embolism and thrombosis of unspecified deep veins of unspecified lower extremity: Secondary | ICD-10-CM | POA: Diagnosis not present

## 2020-12-27 DIAGNOSIS — J849 Interstitial pulmonary disease, unspecified: Secondary | ICD-10-CM | POA: Diagnosis not present

## 2020-12-27 DIAGNOSIS — M1990 Unspecified osteoarthritis, unspecified site: Secondary | ICD-10-CM | POA: Diagnosis not present

## 2020-12-27 DIAGNOSIS — G7249 Other inflammatory and immune myopathies, not elsewhere classified: Secondary | ICD-10-CM | POA: Diagnosis not present

## 2020-12-27 DIAGNOSIS — Z79899 Other long term (current) drug therapy: Secondary | ICD-10-CM | POA: Diagnosis not present

## 2020-12-27 DIAGNOSIS — M609 Myositis, unspecified: Secondary | ICD-10-CM | POA: Diagnosis not present

## 2020-12-27 DIAGNOSIS — I2510 Atherosclerotic heart disease of native coronary artery without angina pectoris: Secondary | ICD-10-CM | POA: Diagnosis not present

## 2020-12-28 ENCOUNTER — Telehealth (HOSPITAL_COMMUNITY): Payer: Self-pay

## 2020-12-28 NOTE — Telephone Encounter (Signed)
Pt called and stated he would like to attend CR in [facility]. Will fax referral to [facility] and close this referral.

## 2020-12-29 ENCOUNTER — Telehealth (HOSPITAL_COMMUNITY): Payer: Self-pay

## 2020-12-29 NOTE — Telephone Encounter (Signed)
Below message in error, will reopen pt referral.

## 2021-01-01 ENCOUNTER — Other Ambulatory Visit: Payer: Self-pay

## 2021-01-01 ENCOUNTER — Other Ambulatory Visit: Payer: Medicare HMO | Admitting: *Deleted

## 2021-01-01 DIAGNOSIS — E1169 Type 2 diabetes mellitus with other specified complication: Secondary | ICD-10-CM | POA: Diagnosis not present

## 2021-01-01 DIAGNOSIS — E785 Hyperlipidemia, unspecified: Secondary | ICD-10-CM | POA: Diagnosis not present

## 2021-01-01 LAB — HEPATIC FUNCTION PANEL
ALT: 20 IU/L (ref 0–44)
AST: 13 IU/L (ref 0–40)
Albumin: 3.6 g/dL — ABNORMAL LOW (ref 3.8–4.8)
Alkaline Phosphatase: 122 IU/L — ABNORMAL HIGH (ref 44–121)
Bilirubin Total: 0.6 mg/dL (ref 0.0–1.2)
Bilirubin, Direct: 0.19 mg/dL (ref 0.00–0.40)
Total Protein: 6 g/dL (ref 6.0–8.5)

## 2021-01-01 LAB — LIPID PANEL
Chol/HDL Ratio: 6.1 ratio — ABNORMAL HIGH (ref 0.0–5.0)
Cholesterol, Total: 212 mg/dL — ABNORMAL HIGH (ref 100–199)
HDL: 35 mg/dL — ABNORMAL LOW (ref >39)
LDL Chol Calc (NIH): 117 mg/dL — ABNORMAL HIGH (ref 0–99)
Triglycerides: 346 mg/dL — ABNORMAL HIGH (ref 0–149)
VLDL Cholesterol Cal: 60 mg/dL — ABNORMAL HIGH (ref 5–40)

## 2021-01-02 ENCOUNTER — Telehealth: Payer: Self-pay | Admitting: Pharmacist

## 2021-01-02 DIAGNOSIS — E1169 Type 2 diabetes mellitus with other specified complication: Secondary | ICD-10-CM

## 2021-01-02 MED ORDER — ROSUVASTATIN CALCIUM 40 MG PO TABS: 40.0000 mg | ORAL_TABLET | Freq: Every day | ORAL | 3 refills | Status: AC

## 2021-01-02 NOTE — Telephone Encounter (Signed)
Contacted patient regarding lipid panel results on 01/01/21 - LDL 117, TC 212, TG 346, HDL 35. Reports medication adherence with Repatha 140 mg every 2 weeks, however LDL increased from 42 in February (on rosuvastatin 40 mg daily monotherapy) to most recent 117 while taking Repatha monotherapy. Reports leg muscle biospy in April was negative for muscle damage and requested to discontinue Repatha and restart rosuvastatin 40 mg daily. Of note, patient was at goal <55 mg/dL while take rosuvastatin 40 mg daily in February 2022. Will discontinue Repatha and restart rosuvastatin 40 mg daily. Follow-up fasting lipid panel and LFTs scheduled in 3 months.

## 2021-01-12 ENCOUNTER — Telehealth: Payer: Self-pay

## 2021-01-12 ENCOUNTER — Ambulatory Visit (INDEPENDENT_AMBULATORY_CARE_PROVIDER_SITE_OTHER): Payer: Medicare HMO | Admitting: Internal Medicine

## 2021-01-12 ENCOUNTER — Encounter: Payer: Self-pay | Admitting: Internal Medicine

## 2021-01-12 VITALS — BP 110/70 | HR 72 | Ht 66.0 in | Wt 177.6 lb

## 2021-01-12 DIAGNOSIS — Z86010 Personal history of colonic polyps: Secondary | ICD-10-CM

## 2021-01-12 DIAGNOSIS — I480 Paroxysmal atrial fibrillation: Secondary | ICD-10-CM | POA: Diagnosis not present

## 2021-01-12 DIAGNOSIS — Z7901 Long term (current) use of anticoagulants: Secondary | ICD-10-CM

## 2021-01-12 DIAGNOSIS — Z7902 Long term (current) use of antithrombotics/antiplatelets: Secondary | ICD-10-CM

## 2021-01-12 DIAGNOSIS — R194 Change in bowel habit: Secondary | ICD-10-CM | POA: Diagnosis not present

## 2021-01-12 MED ORDER — NA SULFATE-K SULFATE-MG SULF 17.5-3.13-1.6 GM/177ML PO SOLN: 1.0000 | Freq: Once | ORAL | 0 refills | Status: AC

## 2021-01-12 NOTE — Progress Notes (Signed)
Eugene [name] 64 y.o. 02/11/1956 [id number]  Assessment & Plan:   Encounter Diagnoses  Name Primary?   Hx of adenomatous colonic polyps Yes   Change in bowel habit    Warfarin anticoagulation    Long term current use of antithrombotics/antiplatelets    Paroxysmal atrial fibrillation ([place])     Plan for colonoscopy and to hold warfarin x 5 d and also clopidogrel x 5 d if acceptable.  If he needs to stay on clopidogrel that's ok and we discussed.  The risks and benefits as well as alternatives of endoscopic procedure(s) have been discussed and reviewed. All questions answered. The patient agrees to proceed.  Rare but real risks of vascular events off warfarin, clopidogrel and he understands.   Subjective:   Chief Complaint: hx adenomatous colon polyps  HPI 65 yo wm w/ CAD, DVT and PAF here for f/u and to arrange colonoscopy. He was to have a surveillance exam late last yr or early 2022 but was found to have restenosis in stent and had a cutting atherectomy.  DVT x 2 and also PAF and initiation of warfarin. Has had some weight loss and regain. Having change in bowel habits with new irregularity on the past few months.   Last colonoscopy 2015 w/ 2 adenomas 7 and 15 mm Wt Readings from Last 3 Encounters:  01/12/21 177 lb 9.6 oz (80.6 kg)  11/16/20 171 lb 3.2 oz (77.7 kg)  10/26/20 164 lb (74.4 kg)   ROS - LE mm wkness - bx inconclusive  No Known Allergies Current Meds  Medication Sig   clopidogrel (PLAVIX) 75 MG tablet Take 75 mg by mouth daily.   furosemide (LASIX) 40 MG tablet Take 40 mg by mouth daily.   insulin aspart (NOVOLOG) 100 UNIT/ML injection Substitute to any brand approved.Before each meal 3 times a day, 140-199 - 2 units, 200-250 - 4 units, 251-299 - 6 units,  300-349 - 8 units,  350 or above 10 units. Dispense syringes and needles as needed, Ok to switch to PEN if approved. DX DM2, Code E11.65 (Patient taking differently: Substitute to any brand  approved.Before each meal 3 times a day, 140-199 - 2 units, 200-250 - 4 units, 251-299 - 6 units,  300-349 - 8 units,  350 or above 10 units. Dispense syringes and needles as needed, Ok to switch to PEN if approved. DX DM2, Code E11.65)   insulin glargine (LANTUS) 100 UNIT/ML Solostar Pen Inject 60 Units into the skin daily before breakfast.   Insulin Syringe-Needle U-100 25G X 1" 1 ML MISC For 4 times a day insulin SQ, 1 month supply. Diagnosis E11.65   metoprolol succinate (TOPROL-XL) 50 MG 24 hr tablet Take 1 tablet (50 mg total) by mouth in the morning and at bedtime. Take with or immediately following a meal.   mycophenolate (CELLCEPT) 500 MG tablet Take 1,000 mg by mouth 2 (two) times daily.   nitroGLYCERIN (NITROSTAT) 0.4 MG SL tablet PLACE 1 TABLET UNDER THE TONGUE EVERY 5 MINUTES AS NEEDED FOR CHEST PAIN   pantoprazole (PROTONIX) 40 MG tablet Take 40 mg by mouth daily.   predniSONE (DELTASONE) 5 MG tablet 8 Pills PO for 3 days,6 Pills PO for 3 days,4 Pills PO for 3 days,2 Pills PO for 3 days,1 Pills PO for 3 days,1/2 Pill  PO for 3 days then STOP. (Patient taking differently: 8 Pills PO for 3 days,6 Pills PO for 3 days,4 Pills PO for 3 days,2 Pills PO for 3 days,1  Pills PO for 3 days,1/2 Pill  PO for 3 days then STOP.)   ranolazine (RANEXA) 500 MG 12 hr tablet TAKE ONE TABLET BY MOUTH TWICE DAILY.PLEASE MAKE APPT WITH DR.HARDING FOR REFILLS.    rosuvastatin (CRESTOR) 40 MG tablet Take 1 tablet (40 mg total) by mouth daily.   vitamin B-12 (CYANOCOBALAMIN) 1000 MCG tablet TAKE 1 TABLET (1,000 MCG TOTAL) BY MOUTH DAILY.   warfarin (COUMADIN) 5 MG tablet TAKE 1 TABLET (5 MG TOTAL) BY MOUTH DAILY AT 4 PM.   Past Medical History:  Diagnosis Date   Abnormal nuclear stress test 02/19/2018   Angina, class III ([place]) 02/19/2018   Anxiety    CHF (congestive heart failure) (HCC)    Chronic kidney disease    CKD STAGE IV   Coronary artery disease    DDD (degenerative disc disease), cervical 03/01/2013    Diabetes mellitus    type 2   Diverticular disease 01/12/14   mild   DVT (deep vein thrombosis) in pregnancy 09/08/2020   Dyslipidemia    Hypertension    ILD (interstitial lung disease) (HCC)    Lumbosacral radiculopathy at L4 02/18/2017   Myocardial infarction ([place]) 2012   Obesity    Snoring 10/12/2013   TIA (transient ischemic attack) 08/09/2014   Tubular adenoma of colon 01/12/2014   transverse colon   VF (ventricular fibrillation) ([place])    arrest   Vitamin D deficiency 02/18/2017   Past Surgical History:  Procedure Laterality Date   ABDOMINAL AORTAGRAM N/A 12/14/2013   Procedure: ABDOMINAL Maxcine Ham;  Surgeon: [name], MD;  Location: [facility] CATH LAB;  Service: Cardiovascular;  Laterality: N/A;   CARDIAC CATHETERIZATION  09/16/10   COLONOSCOPY  01/12/2014   CORONARY ARTERY BYPASS GRAFT  2006   CORONARY BALLOON ANGIOPLASTY N/A 09/12/2020   Procedure: CORONARY BALLOON ANGIOPLASTY;  Surgeon: Troy Sine, MD;  Location: [place] CV LAB;  Service: Cardiovascular;  Laterality: N/A;   CORONARY STENT INTERVENTION  07/06/2020   CORONARY STENT INTERVENTION N/A 07/06/2020   Procedure: CORONARY STENT INTERVENTION;  Surgeon: Nelva Bush, MD;  Location: [place] CV LAB;  Service: Cardiovascular;  Laterality: N/A;   IMPLANTABLE CARDIOVERTER DEFIBRILLATOR IMPLANT  2012   STJ ICD- single chamber   LEFT HEART CATH AND CORS/GRAFTS ANGIOGRAPHY N/A 02/19/2018   Procedure: LEFT HEART CATH AND CORS/GRAFTS ANGIOGRAPHY;  Surgeon: [name], MD;  Location: [place] CV LAB;  Service: Cardiovascular;  Laterality: N/A;   LEFT HEART CATH AND CORS/GRAFTS ANGIOGRAPHY N/A 07/06/2020   Procedure: LEFT HEART CATH AND CORS/GRAFTS ANGIOGRAPHY;  Surgeon: Nelva Bush, MD;  Location: [place] CV LAB;  Service: Cardiovascular;  Laterality: N/A;   LEFT HEART CATH AND CORS/GRAFTS ANGIOGRAPHY N/A 09/12/2020   Procedure: LEFT HEART CATH AND CORS/GRAFTS ANGIOGRAPHY;  Surgeon: Troy Sine,  MD;  Location: [place] CV LAB;  Service: Cardiovascular;  Laterality: N/A;   LOOP RECORDER IMPLANT N/A 08/24/2014   Procedure: LOOP RECORDER IMPLANT;  Surgeon: [name], MD;  Location: [facility] CATH LAB;  Service: Cardiovascular;  Laterality: N/A;   MASS EXCISION  2008   right shoulder   MUSCLE BIOPSY Left 11/01/2020   Procedure: THIGH MUSCLE BIOPSY;  Surgeon: Georganna Skeans, MD;  Location: [facility] ORS;  Service: General;  Laterality: Left;   STERNECTOMY     Social History   Social History Narrative   Single, no children   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   Review of Systems As above  Objective:   Physical Exam BP 110/70   Pulse 72   Ht 5\' 6"  (1.676 m)   Wt 177 lb 9.6 oz (80.6 kg)   SpO2 97%   BMI 28.67 kg/m  Obese NAD Lungs cta Cor S1s2 no rmg Abd obese doft

## 2021-01-12 NOTE — Telephone Encounter (Signed)
[facility] Pre-operative Risk Assessment     Request for surgical clearance:     Endoscopy Procedure  What type of surgery is being performed?     colonoscopy  When is this surgery scheduled?     03/21/21  What type of clearance is required ?   Pharmacy  Are there any medications that need to be held prior to surgery and how long? Coumadin and plavix, 5 days on both if possible  Practice name and name of physician performing surgery?      [facility]  What is your office phone and fax number?      Phone- [phone]  [phone]  Anesthesia type (None, local, MAC, general) ?       MAC

## 2021-01-12 NOTE — Patient Instructions (Signed)
It was my pleasure to provide care to you you today. Based on our discussion, I am providing you with my recommendations below:  RECOMMENDATION(S):   You have been scheduled for a colonoscopy. Please follow written instructions given to you at your visit today.   PREP:   Please pick up your prep supplies at the pharmacy within the next 1-3 days.  INHALERS:   If you use inhalers (even only as needed), please bring them with you on the day of your procedure.  MEDICATIONS TO HOLD:  We will contact your provider to request permission for you to hold COUMADIN AND PLAVIX. Once we receive a response, you will be contacted by our office. If you do not hear from our office 1 week prior to your scheduled procedure, please contact our office at [phone].   COLONOSCOPY TIPS:  To reduce nausea and dehydration, stay well hydrated for 3-4 days prior to the exam.  To prevent skin/hemorrhoid irritation - prior to wiping, put A&Dointment or vaseline on the toilet paper. Keep a towel or pad on the bed.  BEFORE STARTING YOUR PREP, drink  64oz of clear liquids in the morning. This will help to flush the colon and will ensure you are well hydrated!!!!  NOTE - This is in addition to the fluids required for to complete your prep. Use of a flavored hard candy, such as grape Anise Salvo, can counteract some of the flavor of the prep and may prevent some nausea.   FOLLOW UP:  After your procedure, you will receive a call from my office staff regarding my recommendation for follow up.  BMI:  If you are age 63 or older, your body mass index should be between 23-30. Your Body mass index is 28.67 kg/m. If this is out of the aforementioned range listed, please consider follow up with your Primary Care Provider.  If you are age 73 or younger, your body mass index should be between 19-25. Your Body mass index is 28.67 kg/m. If this is out of the aformentioned range listed, please consider follow up with  your Primary Care Provider.   MY CHART:  The  GI providers would like to encourage you to use [facility] to communicate with providers for non-urgent requests or questions.  Due to long hold times on the telephone, sending your provider a message by [facility] may be a faster and more efficient way to get a response.  Please allow 48 business hours for a response.  Please remember that this is for non-urgent requests.   Thank you for trusting me with your gastrointestinal care!    Silvano Rusk, MD

## 2021-01-12 NOTE — Telephone Encounter (Signed)
    Eugene [name] DOB:  01-16-1956  MRN:  [id number]   Primary Cardiologist: Werner Lean, MD  Chart reviewed as part of pre-operative protocol coverage.  Cath 08/2020 with "Difficult but ultimately successful intervention through the SVG to the distal OM 2 vessel requiring PTCA, [name] Cutting Balloon, and ultimate noncompliant balloon dilatation with a 95% stenosis being reduced to less than 20%.   LVEDP: 11 mmHg   RECOMMENDATION: DAPT probably indefinitely.  Since the patient developed early in-stent restenosis on aspirin/Plavix, Brilinta was utilized for more aggressive antiplatelet therapy.  Medical therapy for concomitant CAD"  Dr. Gasper Sells, please give your recommendation about antiplatelet therapy. Pharmacy to review anticoagulation.  Please forward your response to [facility] PREOP.   Thank you       Leanor Kail, PA 01/12/2021, 2:31 PM

## 2021-01-12 NOTE — Telephone Encounter (Signed)
Patient on warfarin for non-cardiac indication and not followed by [facility]. We don't have access to INR results or anticoagulation notes.   Noted hx of TIA, PAD and DVT. Note from Feb/2022 mentioned "recent DVT" , but date not documented.   Warfarin recommendation should be provided by current warfarin clinic.

## 2021-01-15 NOTE — Telephone Encounter (Signed)
Hi Dr. Gasper Sells. Just want to clarify your note below. You mention continuing patient's Brilinta but it looks like we have Plavix listed under his home medications. Just want to confirm which antiplatelet he should be on?  Please route response back to [facility] PREOP.  Thank you! Lior Cartelli

## 2021-01-17 NOTE — Telephone Encounter (Signed)
   Name: Eugene [name]  DOB: [birth year]  MRN: [id number]   Primary Cardiologist: Werner Lean, MD  Chart reviewed as part of pre-operative protocol coverage. Patient was contacted 01/17/2021 in reference to pre-operative risk assessment for pending surgery as outlined below.  Eugene [name] was last seen on 11/16/20 by Eugene [name].    Per Dr. Gasper Sells:  "Patient has significant vasculopathy and if this was an elective procedure we would wait one year before decreasing AC strategy   Given polyp history and weight changes reasonable to proceed with patient with warfarin hold that is needed.  Dr. Carlean Purl seems amenable to attempt the procedure on anti-platelet therapy; given his vascuolpathy would continue his plavix.   Our long term plan will be to continue [facility] and brilinta indefinitely given his level of disease."    In discussion for clearance, he relays he can't sleep and is having profound leg weakness. He was taken off of brilinta and placed on plavix for "panic attacks." He is still having significant issues and is unable to complete 4.0 METS. He states this is a change since he last saw Ms. Eugene [name] in clinic. Chest pain has improved.   Gen cards availability is challenging - He has a scheduled appt with Eugene [name] 02/20/21. Given his new symptoms and inability to complete 4.0 METS, I will defer clearance for colonoscopy to Eugene [name]. I will adjust appt notes (no earlier appt available).   I will route this recommendation to the requesting party via Epic fax function and remove from pre-op pool. Please call with questions.  [place], PA 01/17/2021, 10:02 AM

## 2021-01-17 NOTE — Telephone Encounter (Signed)
Will forward notes to Dr. Lovena Le for appt 02/20/21 which Doreene Adas, PAC has noted appt notes; pt will need pre op clearance as well. Will send FYI to surgeon's office pt has appt 02/20/21.

## 2021-01-22 ENCOUNTER — Telehealth: Payer: Self-pay

## 2021-01-22 NOTE — Telephone Encounter (Signed)
Merlin alert for VF episode on 01/19/21 @ 7:21 am, terminated with 1 HV shock. Patient reports he was asleep and feels like he was able to remember a dream, woke up feeling a jolt in his chest. Patient reports any symptoms around event. Does report increased swelling in lower extremities and some shortness of breath. Denies chest pain, dizziness, lightheadedness or other complaints. Compliant with all medications including Lasix 40 mg daily, Toprol-XL 50 mg BID, Coumadin 5 mg daily. Shock plan reviewed with patient as well as advised no driving x6 months with a start date of 01/19/21 per [place] DMV. Patient agreeable and verbalized understanding. Has in-clinic apt. Scheduled with Dr. Lovena Le 02/20/21. Routing to Dr. Lovena Le for review and further recommendations.

## 2021-01-23 DIAGNOSIS — Z794 Long term (current) use of insulin: Secondary | ICD-10-CM | POA: Diagnosis not present

## 2021-01-23 DIAGNOSIS — D631 Anemia in chronic kidney disease: Secondary | ICD-10-CM | POA: Diagnosis not present

## 2021-01-23 DIAGNOSIS — I5189 Other ill-defined heart diseases: Secondary | ICD-10-CM | POA: Diagnosis not present

## 2021-01-23 DIAGNOSIS — E0821 Diabetes mellitus due to underlying condition with diabetic nephropathy: Secondary | ICD-10-CM | POA: Diagnosis not present

## 2021-01-23 DIAGNOSIS — E1159 Type 2 diabetes mellitus with other circulatory complications: Secondary | ICD-10-CM | POA: Diagnosis not present

## 2021-01-23 DIAGNOSIS — I4891 Unspecified atrial fibrillation: Secondary | ICD-10-CM | POA: Diagnosis not present

## 2021-01-23 DIAGNOSIS — Z7901 Long term (current) use of anticoagulants: Secondary | ICD-10-CM | POA: Diagnosis not present

## 2021-01-23 DIAGNOSIS — E1351 Other specified diabetes mellitus with diabetic peripheral angiopathy without gangrene: Secondary | ICD-10-CM | POA: Diagnosis not present

## 2021-01-23 DIAGNOSIS — R29898 Other symptoms and signs involving the musculoskeletal system: Secondary | ICD-10-CM | POA: Diagnosis not present

## 2021-01-23 DIAGNOSIS — R7401 Elevation of levels of liver transaminase levels: Secondary | ICD-10-CM | POA: Diagnosis not present

## 2021-01-23 DIAGNOSIS — N184 Chronic kidney disease, stage 4 (severe): Secondary | ICD-10-CM | POA: Diagnosis not present

## 2021-01-23 DIAGNOSIS — E785 Hyperlipidemia, unspecified: Secondary | ICD-10-CM | POA: Diagnosis not present

## 2021-01-23 DIAGNOSIS — I700 Atherosclerosis of aorta: Secondary | ICD-10-CM | POA: Diagnosis not present

## 2021-01-23 DIAGNOSIS — E559 Vitamin D deficiency, unspecified: Secondary | ICD-10-CM | POA: Diagnosis not present

## 2021-01-26 ENCOUNTER — Emergency Department (HOSPITAL_COMMUNITY): Payer: Medicare HMO

## 2021-01-26 ENCOUNTER — Encounter (HOSPITAL_COMMUNITY): Payer: Self-pay | Admitting: Emergency Medicine

## 2021-01-26 ENCOUNTER — Inpatient Hospital Stay (HOSPITAL_COMMUNITY)
Admission: EM | Admit: 2021-01-26 | Discharge: 2021-01-31 | DRG: 291 | Disposition: A | Discharge status: Home Health | Payer: Medicare HMO | Attending: Internal Medicine | Admitting: Internal Medicine

## 2021-01-26 ENCOUNTER — Other Ambulatory Visit: Payer: Self-pay

## 2021-01-26 ENCOUNTER — Telehealth: Payer: Self-pay | Admitting: Internal Medicine

## 2021-01-26 DIAGNOSIS — Z6827 Body mass index (BMI) 27.0-27.9, adult: Secondary | ICD-10-CM | POA: Diagnosis not present

## 2021-01-26 DIAGNOSIS — E1122 Type 2 diabetes mellitus with diabetic chronic kidney disease: Secondary | ICD-10-CM | POA: Diagnosis not present

## 2021-01-26 DIAGNOSIS — E1151 Type 2 diabetes mellitus with diabetic peripheral angiopathy without gangrene: Secondary | ICD-10-CM | POA: Diagnosis not present

## 2021-01-26 DIAGNOSIS — Z8673 Personal history of transient ischemic attack (TIA), and cerebral infarction without residual deficits: Secondary | ICD-10-CM

## 2021-01-26 DIAGNOSIS — I480 Paroxysmal atrial fibrillation: Secondary | ICD-10-CM | POA: Diagnosis present

## 2021-01-26 DIAGNOSIS — M5417 Radiculopathy, lumbosacral region: Secondary | ICD-10-CM | POA: Diagnosis present

## 2021-01-26 DIAGNOSIS — Z95810 Presence of automatic (implantable) cardiac defibrillator: Secondary | ICD-10-CM | POA: Diagnosis not present

## 2021-01-26 DIAGNOSIS — E669 Obesity, unspecified: Secondary | ICD-10-CM | POA: Diagnosis present

## 2021-01-26 DIAGNOSIS — I25709 Atherosclerosis of coronary artery bypass graft(s), unspecified, with unspecified angina pectoris: Secondary | ICD-10-CM | POA: Diagnosis not present

## 2021-01-26 DIAGNOSIS — I2510 Atherosclerotic heart disease of native coronary artery without angina pectoris: Secondary | ICD-10-CM | POA: Diagnosis not present

## 2021-01-26 DIAGNOSIS — I5043 Acute on chronic combined systolic (congestive) and diastolic (congestive) heart failure: Secondary | ICD-10-CM | POA: Diagnosis present

## 2021-01-26 DIAGNOSIS — J9601 Acute respiratory failure with hypoxia: Secondary | ICD-10-CM | POA: Diagnosis not present

## 2021-01-26 DIAGNOSIS — Z7901 Long term (current) use of anticoagulants: Secondary | ICD-10-CM

## 2021-01-26 DIAGNOSIS — E876 Hypokalemia: Secondary | ICD-10-CM | POA: Diagnosis present

## 2021-01-26 DIAGNOSIS — R0602 Shortness of breath: Secondary | ICD-10-CM | POA: Diagnosis not present

## 2021-01-26 DIAGNOSIS — Z7952 Long term (current) use of systemic steroids: Secondary | ICD-10-CM

## 2021-01-26 DIAGNOSIS — I5023 Acute on chronic systolic (congestive) heart failure: Secondary | ICD-10-CM

## 2021-01-26 DIAGNOSIS — N1832 Chronic kidney disease, stage 3b: Secondary | ICD-10-CM | POA: Diagnosis not present

## 2021-01-26 DIAGNOSIS — G4730 Sleep apnea, unspecified: Secondary | ICD-10-CM | POA: Diagnosis present

## 2021-01-26 DIAGNOSIS — I4901 Ventricular fibrillation: Secondary | ICD-10-CM | POA: Diagnosis not present

## 2021-01-26 DIAGNOSIS — Z8249 Family history of ischemic heart disease and other diseases of the circulatory system: Secondary | ICD-10-CM

## 2021-01-26 DIAGNOSIS — I110 Hypertensive heart disease with heart failure: Secondary | ICD-10-CM | POA: Diagnosis not present

## 2021-01-26 DIAGNOSIS — I739 Peripheral vascular disease, unspecified: Secondary | ICD-10-CM

## 2021-01-26 DIAGNOSIS — Z20822 Contact with and (suspected) exposure to covid-19: Secondary | ICD-10-CM | POA: Diagnosis present

## 2021-01-26 DIAGNOSIS — Z8616 Personal history of COVID-19: Secondary | ICD-10-CM

## 2021-01-26 DIAGNOSIS — I10 Essential (primary) hypertension: Secondary | ICD-10-CM | POA: Diagnosis not present

## 2021-01-26 DIAGNOSIS — I509 Heart failure, unspecified: Secondary | ICD-10-CM | POA: Diagnosis not present

## 2021-01-26 DIAGNOSIS — K5790 Diverticulosis of intestine, part unspecified, without perforation or abscess without bleeding: Secondary | ICD-10-CM | POA: Diagnosis present

## 2021-01-26 DIAGNOSIS — I517 Cardiomegaly: Secondary | ICD-10-CM | POA: Diagnosis not present

## 2021-01-26 DIAGNOSIS — J849 Interstitial pulmonary disease, unspecified: Secondary | ICD-10-CM | POA: Diagnosis not present

## 2021-01-26 DIAGNOSIS — I252 Old myocardial infarction: Secondary | ICD-10-CM | POA: Diagnosis not present

## 2021-01-26 DIAGNOSIS — E785 Hyperlipidemia, unspecified: Secondary | ICD-10-CM | POA: Diagnosis not present

## 2021-01-26 DIAGNOSIS — K219 Gastro-esophageal reflux disease without esophagitis: Secondary | ICD-10-CM

## 2021-01-26 DIAGNOSIS — R059 Cough, unspecified: Secondary | ICD-10-CM | POA: Diagnosis not present

## 2021-01-26 DIAGNOSIS — I130 Hypertensive heart and chronic kidney disease with heart failure and stage 1 through stage 4 chronic kidney disease, or unspecified chronic kidney disease: Principal | ICD-10-CM | POA: Diagnosis present

## 2021-01-26 DIAGNOSIS — Z955 Presence of coronary angioplasty implant and graft: Secondary | ICD-10-CM

## 2021-01-26 DIAGNOSIS — Z8674 Personal history of sudden cardiac arrest: Secondary | ICD-10-CM

## 2021-01-26 DIAGNOSIS — M5030 Other cervical disc degeneration, unspecified cervical region: Secondary | ICD-10-CM | POA: Diagnosis present

## 2021-01-26 DIAGNOSIS — Z79899 Other long term (current) drug therapy: Secondary | ICD-10-CM

## 2021-01-26 DIAGNOSIS — Z86718 Personal history of other venous thrombosis and embolism: Secondary | ICD-10-CM

## 2021-01-26 DIAGNOSIS — Z833 Family history of diabetes mellitus: Secondary | ICD-10-CM

## 2021-01-26 DIAGNOSIS — Z7902 Long term (current) use of antithrombotics/antiplatelets: Secondary | ICD-10-CM

## 2021-01-26 DIAGNOSIS — E1169 Type 2 diabetes mellitus with other specified complication: Secondary | ICD-10-CM

## 2021-01-26 DIAGNOSIS — I5021 Acute systolic (congestive) heart failure: Secondary | ICD-10-CM | POA: Diagnosis not present

## 2021-01-26 DIAGNOSIS — Z794 Long term (current) use of insulin: Secondary | ICD-10-CM

## 2021-01-26 HISTORY — DX: Presence of automatic (implantable) cardiac defibrillator: Z95.810

## 2021-01-26 HISTORY — DX: Chronic kidney disease, stage 3b: N18.32

## 2021-01-26 HISTORY — DX: Acute embolism and thrombosis of unspecified deep veins of unspecified lower extremity: I82.409

## 2021-01-26 HISTORY — DX: Chronic combined systolic (congestive) and diastolic (congestive) heart failure: I50.42

## 2021-01-26 LAB — CBC
HCT: 47.6 % (ref 39.0–52.0)
Hemoglobin: 14.8 g/dL (ref 13.0–17.0)
MCH: 28.8 pg (ref 26.0–34.0)
MCHC: 31.1 g/dL (ref 30.0–36.0)
MCV: 92.6 fL (ref 80.0–100.0)
Platelets: 339 10*3/uL (ref 150–400)
RBC: 5.14 MIL/uL (ref 4.22–5.81)
RDW: 14.9 % (ref 11.5–15.5)
WBC: 18.3 10*3/uL — ABNORMAL HIGH (ref 4.0–10.5)
nRBC: 0 % (ref 0.0–0.2)

## 2021-01-26 LAB — BASIC METABOLIC PANEL
Anion gap: 11 (ref 5–15)
BUN: 28 mg/dL — ABNORMAL HIGH (ref 8–23)
CO2: 25 mmol/L (ref 22–32)
Calcium: 9.2 mg/dL (ref 8.9–10.3)
Chloride: 101 mmol/L (ref 98–111)
Creatinine, Ser: 1.93 mg/dL — ABNORMAL HIGH (ref 0.61–1.24)
GFR, Estimated: 38 mL/min — ABNORMAL LOW (ref >60)
Glucose, Bld: 187 mg/dL — ABNORMAL HIGH (ref 70–99)
Potassium: 4.6 mmol/L (ref 3.5–5.1)
Sodium: 137 mmol/L (ref 135–145)

## 2021-01-26 LAB — TROPONIN I (HIGH SENSITIVITY)
Troponin I (High Sensitivity): 59 ng/L — ABNORMAL HIGH (ref <18)
Troponin I (High Sensitivity): 59 ng/L — ABNORMAL HIGH (ref <18)

## 2021-01-26 LAB — BRAIN NATRIURETIC PEPTIDE: B Natriuretic Peptide: 419.9 pg/mL — ABNORMAL HIGH (ref 0.0–100.0)

## 2021-01-26 LAB — PROTIME-INR
INR: 1.1 (ref 0.8–1.2)
Prothrombin Time: 14.5 seconds (ref 11.4–15.2)

## 2021-01-26 MED ORDER — FUROSEMIDE 10 MG/ML IJ SOLN
40.0000 mg | Freq: Once | INTRAMUSCULAR | Status: AC
  Administered 2021-01-26: 40 mg via INTRAVENOUS
  Filled 2021-01-26: qty 4

## 2021-01-26 NOTE — Telephone Encounter (Signed)
Pt c/o Shortness Of Breath: STAT if SOB developed within the last 24 hours or pt is noticeably SOB on the phone  1. Are you currently SOB (can you hear that pt is SOB on the phone)? yes  2. How long have you been experiencing SOB? week  3. Are you SOB when sitting or when up moving around? Both more so moving around can walk 5 ft would have to stop  4. Are you currently experiencing any other symptoms? no

## 2021-01-26 NOTE — ED Provider Notes (Signed)
Emergency Medicine Provider Triage Evaluation Note  Eugene [name] , a 65 y.o. male  was evaluated in triage.  Pt complains of leg swelling and SOB. History of CHF and MI earlier this year. On coumadin. States he sometimes forgets to take his coumadin. For past 2 weeks has had dry cough and intermittent leg swelling. He takes 40mg  of lasix daily and this was increased to 60mg  3 days ago and he denies much improvement.   Review of Systems  Positive:  Negative:   Physical Exam  BP 96/68   Pulse 79   Temp 97.7 F (36.5 C) (Oral)   Resp 20   Ht 5\' 5"  (1.651 m)   Wt 77.1 kg   SpO2 100%   BMI 28.29 kg/m  Gen:   Awake, no distress   Resp:  Normal effort  MSK:   Moves extremities without difficulty  Other:    Medical Decision Making  Medically screening exam initiated at 2:30 PM.  Appropriate orders placed.  [name] was informed that the remainder of the evaluation will be completed by another provider, this initial triage assessment does not replace that evaluation, and the importance of remaining in the ED until their evaluation is complete.     Rayna Sexton, PA-C 01/26/21 1432    Horton, Alvin Critchley, DO 01/29/21 1749

## 2021-01-26 NOTE — ED Triage Notes (Signed)
Pt reports intermittent sob the past 2 weeks with a dry, non-productive cough. Pt reports increased dyspnea with exertion. Pt reports increased swelling all over. Hx of CHF, MI in February. Pt takes Coumadin.

## 2021-01-26 NOTE — ED Provider Notes (Signed)
[facility] Emergency Department Provider Note MRN:  [id number]  Arrival date & time: 01/26/21     Chief Complaint   Shortness of Breath   History of Present Illness   Eugene [name] is a 65 y.o. year-old male with a history of CHF, CKD, ILD, CAD presenting to the ED with chief complaint of shortness of breath.  Progressively worsening shortness of breath over the past 2 or 3 weeks.  Much worse with any exertion.  Denies any chest pain.  Frequent dry cough over the same amount of time.  Increased leg swelling as well.  Denies fever, no abdominal pain, no other complaints.  Symptoms are moderate intermittent.  Desaturated to 80% on room air when transitioning to the bed in the emergency department.  Review of Systems  A complete 10 system review of systems was obtained and all systems are negative except as noted in the HPI and PMH.   Patient's Health History    Past Medical History:  Diagnosis Date   Abnormal nuclear stress test 02/19/2018   Angina, class III ([place]) 02/19/2018   Anxiety    CHF (congestive heart failure) (HCC)    Chronic kidney disease    CKD STAGE IV   Coronary artery disease    DDD (degenerative disc disease), cervical 03/01/2013   Diabetes mellitus    type 2   Diverticular disease 01/12/14   mild   DVT (deep vein thrombosis) in pregnancy 09/08/2020   Dyslipidemia    Hypertension    ILD (interstitial lung disease) (HCC)    Lumbosacral radiculopathy at L4 02/18/2017   Myocardial infarction ([place]) 2012   Obesity    Snoring 10/12/2013   TIA (transient ischemic attack) 08/09/2014   Tubular adenoma of colon 01/12/2014   transverse colon   VF (ventricular fibrillation) ([place])    arrest   Vitamin D deficiency 02/18/2017    Past Surgical History:  Procedure Laterality Date   ABDOMINAL AORTAGRAM N/A 12/14/2013   Procedure: ABDOMINAL Maxcine Ham;  Surgeon: [name], MD;  Location: [facility] CATH LAB;  Service: Cardiovascular;  Laterality: N/A;    CARDIAC CATHETERIZATION  09/16/10   COLONOSCOPY  01/12/2014   CORONARY ARTERY BYPASS GRAFT  2006   CORONARY BALLOON ANGIOPLASTY N/A 09/12/2020   Procedure: CORONARY BALLOON ANGIOPLASTY;  Surgeon: Troy Sine, MD;  Location: [place] CV LAB;  Service: Cardiovascular;  Laterality: N/A;   CORONARY STENT INTERVENTION  07/06/2020   CORONARY STENT INTERVENTION N/A 07/06/2020   Procedure: CORONARY STENT INTERVENTION;  Surgeon: Nelva Bush, MD;  Location: [place] CV LAB;  Service: Cardiovascular;  Laterality: N/A;   IMPLANTABLE CARDIOVERTER DEFIBRILLATOR IMPLANT  2012   STJ ICD- single chamber   LEFT HEART CATH AND CORS/GRAFTS ANGIOGRAPHY N/A 02/19/2018   Procedure: LEFT HEART CATH AND CORS/GRAFTS ANGIOGRAPHY;  Surgeon: [name], MD;  Location: [place] CV LAB;  Service: Cardiovascular;  Laterality: N/A;   LEFT HEART CATH AND CORS/GRAFTS ANGIOGRAPHY N/A 07/06/2020   Procedure: LEFT HEART CATH AND CORS/GRAFTS ANGIOGRAPHY;  Surgeon: Nelva Bush, MD;  Location: [place] CV LAB;  Service: Cardiovascular;  Laterality: N/A;   LEFT HEART CATH AND CORS/GRAFTS ANGIOGRAPHY N/A 09/12/2020   Procedure: LEFT HEART CATH AND CORS/GRAFTS ANGIOGRAPHY;  Surgeon: Troy Sine, MD;  Location: [place] CV LAB;  Service: Cardiovascular;  Laterality: N/A;   LOOP RECORDER IMPLANT N/A 08/24/2014   Procedure: LOOP RECORDER IMPLANT;  Surgeon: [name], MD;  Location: [facility] CATH LAB;  Service: Cardiovascular;  Laterality: N/A;   MASS EXCISION  2008   right shoulder   MUSCLE BIOPSY Left 11/01/2020   Procedure: THIGH MUSCLE BIOPSY;  Surgeon: Georganna Skeans, MD;  Location: [facility] ORS;  Service: General;  Laterality: Left;   STERNECTOMY      Family History  Problem Relation Age of Onset   Breast cancer Mother 24       mets to colon and other   Glaucoma Mother    Colon cancer Mother    Coronary artery disease Father        early 68's   Diabetes Father    Hypertension Father    Diabetes Sister     Prostate cancer Brother    Heart disease Brother    Esophageal cancer Neg Hx    Pancreatic cancer Neg Hx    Stomach cancer Neg Hx    Liver disease Neg Hx     Social History   Socioeconomic History   Marital status: Single    Spouse name: Not on file   Number of children: 0   Years of education: Not on file   Highest education level: Not on file  Occupational History   Occupation: STORE MANAGER     Employer: [employer]    Comment: [place], [place]  Tobacco Use   Smoking status: Never   Smokeless tobacco: Never  Vaping Use   Vaping Use: Never used  Substance and Sexual Activity   Alcohol use: No   Drug use: No   Sexual activity: Not Currently  Other Topics Concern   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[place], [place]   6 caffeinated beverages daily   Social Determinants of Health   Financial Resource Strain: Not on file  Food Insecurity: Not on file  Transportation Needs: Not on file  Physical Activity: Not on file  Stress: Not on file  Social Connections: Not on file  Intimate Partner Violence: Not on file     Physical Exam   Vitals:   01/26/21 2254 01/26/21 2257  BP: 124/90   Pulse: 89 79  Resp: (!) 25 18  Temp:    SpO2: (!) 83% 95%    CONSTITUTIONAL: Chronically ill-appearing, NAD NEURO:  Alert and oriented x 3, no focal deficits EYES:  eyes equal and reactive ENT/NECK:  no LAD, no JVD CARDIO: Regular rate, well-perfused, normal S1 and S2 PULM:  CTAB no wheezing or rhonchi GI/GU:  normal bowel sounds, non-distended, non-tender MSK/SPINE:  No gross deformities, pitting edema to bilateral lower extremities SKIN:  no rash, atraumatic PSYCH:  Appropriate speech and behavior  *Additional and/or pertinent findings included in MDM below  Diagnostic and Interventional Summary    EKG Interpretation  Date/Time:  Friday January 26 2021 14:12:27 EDT Ventricular Rate:  76 PR  Interval:  174 QRS Duration: 96 QT Interval:  416 QTC Calculation: 468 R Axis:   191 Text Interpretation: Normal sinus rhythm Right superior axis deviation Incomplete right bundle branch block Right ventricular hypertrophy Septal infarct , age undetermined Abnormal ECG Confirmed by Gerlene Fee [phone]) on 01/26/2021 11:04:32 PM        Labs Reviewed  BASIC METABOLIC PANEL - Abnormal; Notable for the following components:      Result Value   Glucose, Bld 187 (*)    BUN 28 (*)    Creatinine, Ser 1.93 (*)    GFR, Estimated 38 (*)    All  other components within normal limits  CBC - Abnormal; Notable for the following components:   WBC 18.3 (*)    All other components within normal limits  BRAIN NATRIURETIC PEPTIDE - Abnormal; Notable for the following components:   B Natriuretic Peptide 419.9 (*)    All other components within normal limits  TROPONIN I (HIGH SENSITIVITY) - Abnormal; Notable for the following components:   Troponin I (High Sensitivity) 59 (*)    All other components within normal limits  TROPONIN I (HIGH SENSITIVITY) - Abnormal; Notable for the following components:   Troponin I (High Sensitivity) 59 (*)    All other components within normal limits  RESP PANEL BY RT-PCR (FLU A&B, COVID) [id number]  PROTIME-INR    DG Chest 2 View  Final Result      Medications - No data to display   Procedures  /  Critical Care .Critical Care  Date/Time: 01/26/2021 11:33 PM Performed by: Maudie Flakes, MD Authorized by: Maudie Flakes, MD   Critical care provider statement:    Critical care time (minutes):  35   Critical care was necessary to treat or prevent imminent or life-threatening deterioration of the following conditions:  Respiratory failure   Critical care was time spent personally by me on the following activities:  Discussions with consultants, evaluation of patient's response to treatment, examination of patient, ordering and performing treatments and  interventions, ordering and review of laboratory studies, ordering and review of radiographic studies, pulse oximetry, re-evaluation of patient's condition, obtaining history from patient or surrogate and review of old charts  ED Course and Medical Decision Making  I have reviewed the triage vital signs, the nursing notes, and pertinent available records from the EMR.  Listed above are laboratory and imaging tests that I personally ordered, reviewed, and interpreted and then considered in my medical decision making (see below for details).  Suspect CHF exacerbation given patient's extensive cardiac history.  Interstitial lung disease may also be contributing.  Patient is on 3 L nasal cannula to maintain saturations.  This is a new oxygen requirement, will need admission.       Barth Kirks. Sedonia Small, MD [place] [email or web address]  Final Clinical Impressions(s) / ED Diagnoses     ICD-10-CM   1. Acute congestive heart failure, unspecified heart failure type ([place])  I50.9       ED Discharge Orders     None        Discharge Instructions Discussed with and Provided to Patient:   Discharge Instructions   None       Maudie Flakes, MD 01/26/21 2334

## 2021-01-26 NOTE — H&P (Addendum)
History and Physical   Eugene [name] FWY:[id number] DOB: [birth year] DOA: 01/26/2021  PCP: [name], MD   Patient coming from: Home  Chief Complaint: Shortness of breath  HPI: Eugene [name] is a 65 y.o. male with medical history significant of CVA, CAD, CHF, CKD 3B, DDD, diabetes, GERD, hyperlipidemia, peripheral arterial disease, hypertension, long QT, V. fib status post AICD, ILD, sleep apnea, anxiety who presents with ongoing intermittent shortness of breath. As above patient has had 2 weeks of intermittent shortness of breath especially worse on exertion and increased edema.  Also with a dry nonproductive cough.  Does have history of CHF and saw cardiologist who increased dose of Lasix and 40mg  to 63 days ago without much help. Reports AICD has gone off twice. Once in April and once last Friday. He denies fevers, chills, chest pain, abdominal pain, constipation, diarrhea, nausea, vomiting.  ED Course: Vital signs in the ED significant for desaturation to 82% with transfer to ED ED bed improved to the 90s on 4 L.  Not previously requiring oxygen.  Vital signs otherwise stable.  Lab work-up showed BMP with creatinine stable 1.93, glucose 187.  CBC showing leukocytosis to 18 which is stable for the past 6 months.  BNP elevated to 419.  Troponin flat with 59 on first check and repeat.  Chest x-ray with diffuse reticular pattern bilaterally consistent with ILD with mild cardiomegaly.  Patient given dose of Lasix in the ED.  Review of Systems: As per HPI otherwise all other systems reviewed and are negative.  Past Medical History:  Diagnosis Date   Abnormal nuclear stress test 02/19/2018   Angina, class III ([place]) 02/19/2018   Anxiety    CHF (congestive heart failure) (HCC)    Chronic kidney disease    CKD STAGE IV   Coronary artery disease    DDD (degenerative disc disease), cervical 03/01/2013   Diabetes mellitus    type 2   Diverticular disease 01/12/14   mild   DVT (deep vein  thrombosis) in pregnancy 09/08/2020   Dyslipidemia    Hypertension    ILD (interstitial lung disease) (HCC)    Lumbosacral radiculopathy at L4 02/18/2017   Myocardial infarction ([place]) 2012   Obesity    Snoring 10/12/2013   TIA (transient ischemic attack) 08/09/2014   Tubular adenoma of colon 01/12/2014   transverse colon   VF (ventricular fibrillation) ([place])    arrest   Vitamin D deficiency 02/18/2017    Past Surgical History:  Procedure Laterality Date   ABDOMINAL AORTAGRAM N/A 12/14/2013   Procedure: ABDOMINAL Maxcine Ham;  Surgeon: [name], MD;  [facility] CATH LAB;  Service: Cardiovascular;  Laterality: N/A;   CARDIAC CATHETERIZATION  09/16/10   COLONOSCOPY  01/12/2014   CORONARY ARTERY BYPASS GRAFT  2006   CORONARY BALLOON ANGIOPLASTY N/A 09/12/2020   Procedure: CORONARY BALLOON ANGIOPLASTY;  Surgeon: Troy Sine, MD;  Location: [place] CV LAB;  Service: Cardiovascular;  Laterality: N/A;   CORONARY STENT INTERVENTION  07/06/2020   CORONARY STENT INTERVENTION N/A 07/06/2020   Procedure: CORONARY STENT INTERVENTION;  Surgeon: Nelva Bush, MD;  Location: [place] CV LAB;  Service: Cardiovascular;  Laterality: N/A;   IMPLANTABLE CARDIOVERTER DEFIBRILLATOR IMPLANT  2012   STJ ICD- single chamber   LEFT HEART CATH AND CORS/GRAFTS ANGIOGRAPHY N/A 02/19/2018   Procedure: LEFT HEART CATH AND CORS/GRAFTS ANGIOGRAPHY;  Surgeon: [name], MD;  Location: [place] CV LAB;  Service: Cardiovascular;  Laterality: N/A;   LEFT  HEART CATH AND CORS/GRAFTS ANGIOGRAPHY N/A 07/06/2020   Procedure: LEFT HEART CATH AND CORS/GRAFTS ANGIOGRAPHY;  Surgeon: Nelva Bush, MD;  Location: [place] CV LAB;  Service: Cardiovascular;  Laterality: N/A;   LEFT HEART CATH AND CORS/GRAFTS ANGIOGRAPHY N/A 09/12/2020   Procedure: LEFT HEART CATH AND CORS/GRAFTS ANGIOGRAPHY;  Surgeon: Troy Sine, MD;  Location: [place] CV LAB;  Service: Cardiovascular;  Laterality: N/A;   LOOP  RECORDER IMPLANT N/A 08/24/2014   Procedure: LOOP RECORDER IMPLANT;  Surgeon: [name], MD;  Location: [facility] CATH LAB;  Service: Cardiovascular;  Laterality: N/A;   MASS EXCISION  2008   right shoulder   MUSCLE BIOPSY Left 11/01/2020   Procedure: THIGH MUSCLE BIOPSY;  Surgeon: Georganna Skeans, MD;  Location: [facility] ORS;  Service: General;  Laterality: Left;   STERNECTOMY      Social History  reports that he has never smoked. He has never used smokeless tobacco. He reports that he does not drink alcohol and does not use drugs.  No Known Allergies  Family History  Problem Relation Age of Onset   Breast cancer Mother 74       mets to colon and other   Glaucoma Mother    Colon cancer Mother    Coronary artery disease Father        early 34's   Diabetes Father    Hypertension Father    Diabetes Sister    Prostate cancer Brother    Heart disease Brother    Esophageal cancer Neg Hx    Pancreatic cancer Neg Hx    Stomach cancer Neg Hx    Liver disease Neg Hx   Reviewed on admission  Prior to Admission medications   Medication Sig Start Date End Date Taking? Authorizing Provider  clopidogrel (PLAVIX) 75 MG tablet Take 75 mg by mouth daily.   Yes [provider]  furosemide (LASIX) 40 MG tablet Take 40 mg by mouth daily.   Yes [provider]  insulin aspart (NOVOLOG) 100 UNIT/ML injection Substitute to any brand approved.Before each meal 3 times a day, 140-199 - 2 units, 200-250 - 4 units, 251-299 - 6 units,  300-349 - 8 units,  350 or above 10 units. Dispense syringes and needles as needed, Ok to switch to PEN if approved. DX DM2, Code E11.65 Patient taking differently: Substitute to any brand approved.Before each meal 3 times a day, 140-199 - 2 units, 200-250 - 4 units, 251-299 - 6 units,  300-349 - 8 units,  350 or above 10 units. Dispense syringes and needles as needed, Ok to switch to PEN if approved. DX DM2, Code E11.65 09/16/20  Yes Thurnell Lose, MD  insulin  glargine (LANTUS) 100 UNIT/ML Solostar Pen Inject 60 Units into the skin daily before breakfast. 11/19/16  Yes [provider]  metoprolol succinate (TOPROL-XL) 50 MG 24 hr tablet Take 1 tablet (50 mg total) by mouth in the morning and at bedtime. Take with or immediately following a meal. 11/16/20  Yes Baldwin Jamaica, PA-C  mycophenolate (CELLCEPT) 500 MG tablet Take 1,000 mg by mouth 2 (two) times daily.   Yes [provider]  nitroGLYCERIN (NITROSTAT) 0.4 MG SL tablet PLACE 1 TABLET UNDER THE TONGUE EVERY 5 MINUTES AS NEEDED FOR CHEST PAIN 11/03/20  Yes Chandrasekhar, Mahesh A, MD  OZEMPIC, 1 MG/DOSE, 4 MG/3ML SOPN Inject 1 mg into the skin once a week. 01/23/21  Yes [provider]  pantoprazole (PROTONIX) 40 MG tablet Take 40  mg by mouth daily.   Yes [provider]  predniSONE (DELTASONE) 5 MG tablet 8 Pills PO for 3 days,6 Pills PO for 3 days,4 Pills PO for 3 days,2 Pills PO for 3 days,1 Pills PO for 3 days,1/2 Pill  PO for 3 days then STOP. Patient taking differently: Take 5 mg by mouth daily with breakfast. 09/16/20  Yes Thurnell Lose, MD  ranolazine (RANEXA) 500 MG 12 hr tablet TAKE ONE TABLET BY MOUTH TWICE DAILY.PLEASE MAKE APPT WITH DR.HARDING FOR REFILLS.  Patient taking differently: Take 500 mg by mouth daily. 04/15/19  Yes [name], MD  vitamin B-12 (CYANOCOBALAMIN) 1000 MCG tablet TAKE 1 TABLET (1,000 MCG TOTAL) BY MOUTH DAILY. 09/15/20 09/15/21 Yes Thurnell Lose, MD  warfarin (COUMADIN) 5 MG tablet TAKE 1 TABLET (5 MG TOTAL) BY MOUTH DAILY AT 4 PM. Patient taking differently: Take 2.5-5 mg by mouth See admin instructions. Take 1 tablet (5 mg) daily except on Sun Take 1/2 tablet (2.5 mg) 09/16/20 09/16/21 Yes Thurnell Lose, MD  Insulin Syringe-Needle U-100 25G X 1" 1 ML MISC For 4 times a day insulin SQ, 1 month supply. Diagnosis E11.65 09/16/20   Thurnell Lose, MD  rosuvastatin (CRESTOR) 40 MG tablet Take 1 tablet (40 mg total) by  mouth daily. 01/02/21 04/02/21  Werner Lean, MD    Physical Exam: Vitals:   01/26/21 2254 01/26/21 2257 01/26/21 2315 01/27/21 0000  BP: 124/90  107/65 120/78  Pulse: 89 79 80 82  Resp: (!) 25 18 19 17   Temp:      TempSrc:      SpO2: (!) 83% 95% 98% 100%  Weight:      Height:       Physical Exam Constitutional:      General: He is not in acute distress.    Appearance: Normal appearance.  HENT:     Head: Normocephalic and atraumatic.     Mouth/Throat:     Mouth: Mucous membranes are moist.     Pharynx: Oropharynx is clear.  Eyes:     Extraocular Movements: Extraocular movements intact.     Pupils: Pupils are equal, round, and reactive to light.  Cardiovascular:     Rate and Rhythm: Normal rate and regular rhythm.     Pulses: Normal pulses.     Heart sounds: Normal heart sounds.  Pulmonary:     Effort: Pulmonary effort is normal. No respiratory distress.     Breath sounds: Rales present.  Abdominal:     General: Bowel sounds are normal. There is no distension.     Palpations: Abdomen is soft.     Tenderness: There is no abdominal tenderness.  Musculoskeletal:        General: No swelling or deformity.     Right lower leg: Edema present.     Left lower leg: Edema present.  Skin:    General: Skin is warm and dry.  Neurological:     General: No focal deficit present.     Mental Status: Mental status is at baseline.   Labs on Admission: I have personally reviewed following labs and imaging studies  CBC: Recent Labs  Lab 01/26/21 1426  WBC 18.3*  HGB 14.8  HCT 47.6  MCV 92.6  PLT 742    Basic Metabolic Panel: Recent Labs  Lab 01/26/21 1426  NA 137  K 4.6  CL 101  CO2 25  GLUCOSE 187*  BUN 28*  CREATININE 1.93*  CALCIUM 9.2  GFR: Estimated Creatinine Clearance: 36.5 mL/min (A) (by C-G formula based on SCr of 1.93 mg/dL (H)).  Liver Function Tests: No results for input(s): AST, ALT, ALKPHOS, BILITOT, PROT, ALBUMIN in the last 168  hours.  Urine analysis:    Component Value Date/Time   COLORURINE YELLOW 09/09/2020 0032   APPEARANCEUR CLOUDY (A) 09/09/2020 0032   LABSPEC 1.013 09/09/2020 0032   PHURINE 6.0 09/09/2020 0032   GLUCOSEU 150 (A) 09/09/2020 0032   HGBUR LARGE (A) 09/09/2020 0032   BILIRUBINUR NEGATIVE 09/09/2020 0032   KETONESUR NEGATIVE 09/09/2020 0032   PROTEINUR 100 (A) 09/09/2020 0032   UROBILINOGEN 0.2 09/06/2010 1730   NITRITE NEGATIVE 09/09/2020 0032   LEUKOCYTESUR MODERATE (A) 09/09/2020 0032    Radiological Exams on Admission: DG Chest 2 View  Result Date: 01/26/2021 CLINICAL DATA:  Shortness of breath and cough EXAM: CHEST - 2 VIEW COMPARISON:  09/08/2020, CT 09/09/2020 FINDINGS: Post sternotomy changes. Left-sided pacing device as before. Recording device over the left chest. Diffuse bilateral reticular opacity consistent with chronic interstitial lung disease. No acute superimposed airspace disease. Mild cardiomegaly. No pleural effusion or pneumothorax. IMPRESSION: 1. Diffuse bilateral reticular opacity consistent with chronic interstitial lung disease as demonstrated on prior CT. No acute confluent airspace disease. 2. Mild cardiomegaly Electronically Signed   By: Donavan Foil M.D.   On: 01/26/2021 15:19    EKG: Independently reviewed.  Sinus rhythm, right bundle blanch block incomplete, similar to previous, QTC 468.  Assessment/Plan Principal Problem:   Acute exacerbation of CHF (congestive heart failure) (HCC) Active Problems:   Hyperlipidemia   HYPERTENSION, BENIGN   CAD, NATIVE VESSEL   VENTRICULAR FIBRILLATION   Single implantable cardioverter-defibrillator (ICD) in situ   PAD (peripheral artery disease) ([place])   Coronary artery disease involving coronary bypass graft of native heart with angina pectoris (HCC)   History of CVA (cerebrovascular accident)   Gastroesophageal reflux disease without esophagitis   Chronic kidney disease, stage 3b (HCC)   ILD (interstitial lung  disease) ([place])   CHF exacerbation Acute respiratory failure with hypoxia > Patient presenting with 2 weeks of intermittent shortness of breath and dry nonproductive cough.  Also with increased dyspnea on exertion and edema. > Recently had home dose of Lasix increased from 40 mg to 60 mg outpatient 3 days ago however this has not seemed to help. > Chest x-ray did not show significant findings however pulmonary edema could be masked by his scarring from ILD.  Does have edema and BNP elevated to 419.  Troponin flat at 59 on first check and 59 again on repeat.  > After transferring to his bed he desaturated into the 80s and is requiring 4 L to maintain saturations in the 90s. > If fails to respond to heart failure treatment could consider if ILD is playing a role (could just be affecting his ability to compensate to heart failure) - Monitor on telemetry - Continue with Lasix 40 mg IV twice daily - Strict I's and O's, daily weights - Check magnesium - Trend renal function and electrolytes - Last echocardiogram was in February, will hold off for now - Continue home metoprolol  CAD > Significant history of CAD with history of CABG and recent MI in February > As above troponin flat at 59 on first check and on repeat.  No chest pain. - Continue with home metoprolol, Plavix, aspirin  History of V. fib status post AICD > Reports AICD has gone off twice. Once in April and once last Friday.  History of CVA Peripheral arterial disease Hyperlipidemia - Continue home rosuvastatin and Plavix  ILD Leukocytosis > Follows outpatient with pulmonology and rheumatology. > Currently on course of prednisone with chronic leukocytosis for the past 6 months ranging from 15 to 20s.  Current leukocytosis to 18.  No fevers. - Continue home prednisone and mycophenolate  Diabetes > On 60 units Lantus each morning at home - Start 30 units daily and SSI  CKD 3B > Creatinine stable at 1.93 - Avoid nephrotoxic  agents - Trend renal function and electrolytes  GERD - Continue PPI  History of DVT > DVT in February - Continue home warfarin  DVT prophylaxis: Warfarin  Code Status:   Full  Family Communication:  Family updated at beside  Disposition Plan:   Patient is from:  Home  Anticipated DC to:  Home  Anticipated DC date:  1 to 3 days  Anticipated DC barriers: None  Consults called:  None  Admission status:  Observation, telemetry  Severity of Illness: The appropriate patient status for this patient is OBSERVATION. Observation status is judged to be reasonable and necessary in order to provide the required intensity of service to ensure the patient's safety. The patient's presenting symptoms, physical exam findings, and initial radiographic and laboratory data in the context of their medical condition is felt to place them at decreased risk for further clinical deterioration. Furthermore, it is anticipated that the patient will be medically stable for discharge from the hospital within 2 midnights of admission. The following factors support the patient status of observation.   " The patient's presenting symptoms include shortness of breath. " The physical exam findings include edema, rales. " The initial radiographic and laboratory data are chest x-ray with diffuse bilateral reticular pattern consistent with ILD.  BNP 419, troponin flat at 59 on first check and again on repeat.  Leukocytosis to 18.  Creatinine stable 1.93, glucose 187.   Eugene Bruins MD [facility]  How to contact the [facility] Attending or Consulting provider [place] or covering provider during after hours [place], for this patient?   Check the care team in [facility] and look for a) attending/consulting [facility] provider listed and b) the [facility] team listed Log into [email or web address] and use [place]'s universal password to access. If you do not have the password, please contact the hospital operator. Locate the [facility] provider you  are looking for under [facility] and page to a number that you can be directly reached. If you still have difficulty reaching the provider, please page the [facility] (Director on Call) for the Hospitalists listed on amion for assistance.  01/27/2021, 12:18 AM

## 2021-01-26 NOTE — Telephone Encounter (Signed)
Spoke with pt and for about 1 1/2  to 2 weeks has been SOB pt has to stop activity and rest and then resume activity  Per pt notes swelling to feet, legs.arms, and face Pt did see PCP a couple of days ago and Lasix was increased to 60 mg per day from 40 mg Per pt some improvement noted  Pt also complaining with cough  and defib fired last Friday this has happened 2 times in a month Note latest labs K value as well as kidney functions .Marland KitchenDiscussed with Dr Caryl Comes DOD pt needs to go to ED for evaluation and treatment Pt aware of recommendations and agrees./cy

## 2021-01-26 NOTE — ED Notes (Signed)
Pt O2 sat 82% after transferring to bed. Pt placed on 4L [place] improved to 96%. RN made aware.

## 2021-01-27 ENCOUNTER — Inpatient Hospital Stay (HOSPITAL_COMMUNITY): Payer: Medicare HMO

## 2021-01-27 ENCOUNTER — Encounter (HOSPITAL_COMMUNITY): Payer: Self-pay | Admitting: Internal Medicine

## 2021-01-27 DIAGNOSIS — E876 Hypokalemia: Secondary | ICD-10-CM | POA: Diagnosis present

## 2021-01-27 DIAGNOSIS — K219 Gastro-esophageal reflux disease without esophagitis: Secondary | ICD-10-CM | POA: Diagnosis present

## 2021-01-27 DIAGNOSIS — I130 Hypertensive heart and chronic kidney disease with heart failure and stage 1 through stage 4 chronic kidney disease, or unspecified chronic kidney disease: Secondary | ICD-10-CM | POA: Diagnosis present

## 2021-01-27 DIAGNOSIS — N1832 Chronic kidney disease, stage 3b: Secondary | ICD-10-CM | POA: Diagnosis present

## 2021-01-27 DIAGNOSIS — I5023 Acute on chronic systolic (congestive) heart failure: Secondary | ICD-10-CM | POA: Diagnosis not present

## 2021-01-27 DIAGNOSIS — I5043 Acute on chronic combined systolic (congestive) and diastolic (congestive) heart failure: Secondary | ICD-10-CM | POA: Diagnosis present

## 2021-01-27 DIAGNOSIS — I509 Heart failure, unspecified: Secondary | ICD-10-CM | POA: Diagnosis not present

## 2021-01-27 DIAGNOSIS — I480 Paroxysmal atrial fibrillation: Secondary | ICD-10-CM | POA: Diagnosis present

## 2021-01-27 DIAGNOSIS — I4901 Ventricular fibrillation: Secondary | ICD-10-CM

## 2021-01-27 DIAGNOSIS — J9601 Acute respiratory failure with hypoxia: Secondary | ICD-10-CM | POA: Diagnosis present

## 2021-01-27 DIAGNOSIS — I25709 Atherosclerosis of coronary artery bypass graft(s), unspecified, with unspecified angina pectoris: Secondary | ICD-10-CM | POA: Diagnosis present

## 2021-01-27 DIAGNOSIS — K5790 Diverticulosis of intestine, part unspecified, without perforation or abscess without bleeding: Secondary | ICD-10-CM | POA: Diagnosis present

## 2021-01-27 DIAGNOSIS — G4730 Sleep apnea, unspecified: Secondary | ICD-10-CM | POA: Diagnosis present

## 2021-01-27 DIAGNOSIS — E669 Obesity, unspecified: Secondary | ICD-10-CM | POA: Diagnosis present

## 2021-01-27 DIAGNOSIS — Z8616 Personal history of COVID-19: Secondary | ICD-10-CM | POA: Diagnosis not present

## 2021-01-27 DIAGNOSIS — I5021 Acute systolic (congestive) heart failure: Secondary | ICD-10-CM | POA: Diagnosis not present

## 2021-01-27 DIAGNOSIS — M5030 Other cervical disc degeneration, unspecified cervical region: Secondary | ICD-10-CM | POA: Diagnosis present

## 2021-01-27 DIAGNOSIS — I252 Old myocardial infarction: Secondary | ICD-10-CM | POA: Diagnosis not present

## 2021-01-27 DIAGNOSIS — E785 Hyperlipidemia, unspecified: Secondary | ICD-10-CM | POA: Diagnosis present

## 2021-01-27 DIAGNOSIS — I2510 Atherosclerotic heart disease of native coronary artery without angina pectoris: Secondary | ICD-10-CM | POA: Diagnosis present

## 2021-01-27 DIAGNOSIS — I10 Essential (primary) hypertension: Secondary | ICD-10-CM | POA: Diagnosis not present

## 2021-01-27 DIAGNOSIS — Z20822 Contact with and (suspected) exposure to covid-19: Secondary | ICD-10-CM | POA: Diagnosis present

## 2021-01-27 DIAGNOSIS — E1151 Type 2 diabetes mellitus with diabetic peripheral angiopathy without gangrene: Secondary | ICD-10-CM | POA: Diagnosis present

## 2021-01-27 DIAGNOSIS — Z6827 Body mass index (BMI) 27.0-27.9, adult: Secondary | ICD-10-CM | POA: Diagnosis not present

## 2021-01-27 DIAGNOSIS — E1122 Type 2 diabetes mellitus with diabetic chronic kidney disease: Secondary | ICD-10-CM | POA: Diagnosis present

## 2021-01-27 DIAGNOSIS — I110 Hypertensive heart disease with heart failure: Secondary | ICD-10-CM | POA: Diagnosis not present

## 2021-01-27 DIAGNOSIS — J849 Interstitial pulmonary disease, unspecified: Secondary | ICD-10-CM | POA: Diagnosis present

## 2021-01-27 DIAGNOSIS — M5417 Radiculopathy, lumbosacral region: Secondary | ICD-10-CM | POA: Diagnosis present

## 2021-01-27 LAB — PROTIME-INR
INR: 1.2 (ref 0.8–1.2)
Prothrombin Time: 15.1 seconds (ref 11.4–15.2)

## 2021-01-27 LAB — CBC
HCT: 43.4 % (ref 39.0–52.0)
Hemoglobin: 14.1 g/dL (ref 13.0–17.0)
MCH: 29 pg (ref 26.0–34.0)
MCHC: 32.5 g/dL (ref 30.0–36.0)
MCV: 89.1 fL (ref 80.0–100.0)
Platelets: 292 10*3/uL (ref 150–400)
RBC: 4.87 MIL/uL (ref 4.22–5.81)
RDW: 14.6 % (ref 11.5–15.5)
WBC: 14.9 10*3/uL — ABNORMAL HIGH (ref 4.0–10.5)
nRBC: 0 % (ref 0.0–0.2)

## 2021-01-27 LAB — CBG MONITORING, ED: Glucose-Capillary: 188 mg/dL — ABNORMAL HIGH (ref 70–99)

## 2021-01-27 LAB — ECHOCARDIOGRAM COMPLETE
Area-P 1/2: 2.83 cm2
Height: 65 in
S' Lateral: 3.5 cm
Weight: 2776 oz

## 2021-01-27 LAB — GLUCOSE, CAPILLARY
Glucose-Capillary: 177 mg/dL — ABNORMAL HIGH (ref 70–99)
Glucose-Capillary: 192 mg/dL — ABNORMAL HIGH (ref 70–99)
Glucose-Capillary: 119 mg/dL — ABNORMAL HIGH (ref 70–99)
Glucose-Capillary: 152 mg/dL — ABNORMAL HIGH (ref 70–99)

## 2021-01-27 LAB — BASIC METABOLIC PANEL
Anion gap: 11 (ref 5–15)
BUN: 25 mg/dL — ABNORMAL HIGH (ref 8–23)
CO2: 24 mmol/L (ref 22–32)
Calcium: 8.9 mg/dL (ref 8.9–10.3)
Chloride: 102 mmol/L (ref 98–111)
Creatinine, Ser: 1.68 mg/dL — ABNORMAL HIGH (ref 0.61–1.24)
GFR, Estimated: 45 mL/min — ABNORMAL LOW (ref >60)
Glucose, Bld: 176 mg/dL — ABNORMAL HIGH (ref 70–99)
Potassium: 3.4 mmol/L — ABNORMAL LOW (ref 3.5–5.1)
Sodium: 137 mmol/L (ref 135–145)

## 2021-01-27 LAB — RESP PANEL BY RT-PCR (FLU A&B, COVID) ARPGX2
Influenza A by PCR: NEGATIVE
Influenza B by PCR: NEGATIVE
SARS Coronavirus 2 by RT PCR: NEGATIVE

## 2021-01-27 LAB — MAGNESIUM: Magnesium: 2.2 mg/dL (ref 1.7–2.4)

## 2021-01-27 MED ORDER — POLYETHYLENE GLYCOL 3350 17 G PO PACK
17.0000 g | PACK | Freq: Every day | ORAL | Status: DC
  Administered 2021-01-27 – 2021-01-29 (×2): 17 g via ORAL
  Filled 2021-01-27 (×5): qty 1

## 2021-01-27 MED ORDER — POLYETHYLENE GLYCOL 3350 17 G PO PACK: 17.0000 g | PACK | Freq: Every day | ORAL | Status: DC | PRN

## 2021-01-27 MED ORDER — ROSUVASTATIN CALCIUM 20 MG PO TABS
40.0000 mg | ORAL_TABLET | Freq: Every day | ORAL | Status: DC
  Administered 2021-01-27 – 2021-01-31 (×5): 40 mg via ORAL
  Filled 2021-01-27 (×5): qty 2

## 2021-01-27 MED ORDER — SODIUM CHLORIDE 0.9% FLUSH
3.0000 mL | Freq: Two times a day (BID) | INTRAVENOUS | Status: DC
  Administered 2021-01-27 – 2021-01-31 (×5): 3 mL via INTRAVENOUS

## 2021-01-27 MED ORDER — MYCOPHENOLATE MOFETIL 250 MG PO CAPS
1000.0000 mg | ORAL_CAPSULE | Freq: Two times a day (BID) | ORAL | Status: DC
  Administered 2021-01-27 – 2021-01-31 (×9): 1000 mg via ORAL
  Filled 2021-01-27 (×10): qty 4

## 2021-01-27 MED ORDER — HEPARIN BOLUS VIA INFUSION
3000.0000 [IU] | Freq: Once | INTRAVENOUS | Status: AC
  Administered 2021-01-27: 3000 [IU] via INTRAVENOUS
  Filled 2021-01-27: qty 3000

## 2021-01-27 MED ORDER — PERFLUTREN LIPID MICROSPHERE
1.0000 mL | INTRAVENOUS | Status: AC | PRN
  Administered 2021-01-27: 3 mL via INTRAVENOUS
  Filled 2021-01-27: qty 10

## 2021-01-27 MED ORDER — RANOLAZINE ER 500 MG PO TB12
500.0000 mg | ORAL_TABLET | Freq: Every day | ORAL | Status: DC
  Administered 2021-01-27 – 2021-01-31 (×5): 500 mg via ORAL
  Filled 2021-01-27 (×5): qty 1

## 2021-01-27 MED ORDER — ACETAMINOPHEN 325 MG PO TABS: 650.0000 mg | ORAL_TABLET | Freq: Four times a day (QID) | ORAL | Status: DC | PRN

## 2021-01-27 MED ORDER — WARFARIN - PHARMACIST DOSING INPATIENT: Freq: Every day | Status: DC

## 2021-01-27 MED ORDER — WARFARIN SODIUM 5 MG PO TABS
5.0000 mg | ORAL_TABLET | Freq: Once | ORAL | Status: AC
  Administered 2021-01-27: 5 mg via ORAL
  Filled 2021-01-27: qty 1

## 2021-01-27 MED ORDER — FUROSEMIDE 10 MG/ML IJ SOLN
40.0000 mg | Freq: Two times a day (BID) | INTRAMUSCULAR | Status: AC
  Administered 2021-01-27 – 2021-01-30 (×8): 40 mg via INTRAVENOUS
  Filled 2021-01-27 (×8): qty 4

## 2021-01-27 MED ORDER — INSULIN ASPART 100 UNIT/ML IJ SOLN
0.0000 [IU] | Freq: Every day | INTRAMUSCULAR | Status: DC
  Administered 2021-01-31: 5 [IU] via SUBCUTANEOUS

## 2021-01-27 MED ORDER — PANTOPRAZOLE SODIUM 40 MG PO TBEC
40.0000 mg | DELAYED_RELEASE_TABLET | Freq: Every day | ORAL | Status: DC
  Administered 2021-01-27 – 2021-01-31 (×5): 40 mg via ORAL
  Filled 2021-01-27 (×5): qty 1

## 2021-01-27 MED ORDER — METOPROLOL SUCCINATE ER 50 MG PO TB24
50.0000 mg | ORAL_TABLET | Freq: Two times a day (BID) | ORAL | Status: DC
  Administered 2021-01-28 – 2021-01-31 (×7): 50 mg via ORAL
  Filled 2021-01-27 (×7): qty 1

## 2021-01-27 MED ORDER — ACETAMINOPHEN 650 MG RE SUPP: 650.0000 mg | Freq: Four times a day (QID) | RECTAL | Status: DC | PRN

## 2021-01-27 MED ORDER — CLOPIDOGREL BISULFATE 75 MG PO TABS
75.0000 mg | ORAL_TABLET | Freq: Every day | ORAL | Status: DC
  Administered 2021-01-27 – 2021-01-31 (×5): 75 mg via ORAL
  Filled 2021-01-27 (×5): qty 1

## 2021-01-27 MED ORDER — PREDNISONE 5 MG PO TABS
5.0000 mg | ORAL_TABLET | Freq: Every day | ORAL | Status: DC
  Administered 2021-01-27 – 2021-01-31 (×5): 5 mg via ORAL
  Filled 2021-01-27 (×6): qty 1

## 2021-01-27 MED ORDER — INSULIN ASPART 100 UNIT/ML IJ SOLN
0.0000 [IU] | Freq: Three times a day (TID) | INTRAMUSCULAR | Status: DC
  Administered 2021-01-27 – 2021-01-28 (×3): 3 [IU] via SUBCUTANEOUS
  Administered 2021-01-28 – 2021-01-29 (×4): 2 [IU] via SUBCUTANEOUS
  Administered 2021-01-30: 5 [IU] via SUBCUTANEOUS
  Administered 2021-01-30: 11 [IU] via SUBCUTANEOUS

## 2021-01-27 MED ORDER — INSULIN GLARGINE 100 UNIT/ML ~~LOC~~ SOLN
30.0000 [IU] | Freq: Every day | SUBCUTANEOUS | Status: DC
  Administered 2021-01-27 – 2021-01-31 (×5): 30 [IU] via SUBCUTANEOUS
  Filled 2021-01-27 (×7): qty 0.3

## 2021-01-27 MED ORDER — HEPARIN (PORCINE) 25000 UT/250ML-% IV SOLN
1400.0000 [IU]/h | INTRAVENOUS | Status: DC
  Administered 2021-01-27: 1050 [IU]/h via INTRAVENOUS
  Administered 2021-01-28: 1150 [IU]/h via INTRAVENOUS
  Administered 2021-01-29: 1100 [IU]/h via INTRAVENOUS
  Administered 2021-01-30: 1600 [IU]/h via INTRAVENOUS
  Filled 2021-01-27 (×4): qty 250

## 2021-01-27 MED ORDER — WARFARIN SODIUM 7.5 MG PO TABS
7.5000 mg | ORAL_TABLET | Freq: Once | ORAL | Status: AC
  Administered 2021-01-27: 7.5 mg via ORAL
  Filled 2021-01-27 (×2): qty 1

## 2021-01-27 MED ORDER — POTASSIUM CHLORIDE CRYS ER 20 MEQ PO TBCR
40.0000 meq | EXTENDED_RELEASE_TABLET | Freq: Once | ORAL | Status: AC
  Administered 2021-01-27: 40 meq via ORAL
  Filled 2021-01-27: qty 2

## 2021-01-27 MED ORDER — GUAIFENESIN ER 600 MG PO TB12
600.0000 mg | ORAL_TABLET | Freq: Two times a day (BID) | ORAL | Status: DC
  Administered 2021-01-27 – 2021-01-31 (×9): 600 mg via ORAL
  Filled 2021-01-27 (×9): qty 1

## 2021-01-27 NOTE — Progress Notes (Signed)
ANTICOAGULATION CONSULT NOTE - Follow Up Consult  Pharmacy Consult for Coumadin Indication: H/O DVT  No Known Allergies  Patient Measurements: Height: 5\' 5"  (165.1 cm) Weight: 78.7 kg (173 lb 8 oz) IBW/kg (Calculated) : 61.5  Vital Signs: Temp: 98.3 F (36.8 C) (07/02 0752) Temp Source: Oral (07/02 0752) BP: 102/74 (07/02 0752) Pulse Rate: 72 (07/02 0752)  Labs: Recent Labs    01/26/21 1426 01/26/21 1728 01/27/21 0352  HGB 14.8  --  14.1  HCT 47.6  --  43.4  PLT 339  --  292  LABPROT 14.5  --  15.1  INR 1.1  --  1.2  CREATININE 1.93*  --  1.68*  TROPONINIHS 59* 59*  --      Estimated Creatinine Clearance: 42.4 mL/min (A) (by C-G formula based on SCr of 1.68 mg/dL (H)).   Medical History: Past Medical History:  Diagnosis Date   Abnormal nuclear stress test 02/19/2018   Angina, class III ([place]) 02/19/2018   Anxiety    CHF (congestive heart failure) (HCC)    Chronic kidney disease    CKD STAGE IV   Coronary artery disease    DDD (degenerative disc disease), cervical 03/01/2013   Diabetes mellitus    type 2   Diverticular disease 01/12/14   mild   DVT (deep vein thrombosis) in pregnancy 09/08/2020   Dyslipidemia    Hypertension    ILD (interstitial lung disease) (HCC)    Lumbosacral radiculopathy at L4 02/18/2017   Myocardial infarction [facility]) 2012   Obesity    Snoring 10/12/2013   TIA (transient ischemic attack) 08/09/2014   Tubular adenoma of colon 01/12/2014   transverse colon   VF (ventricular fibrillation) ([place])    arrest   Vitamin D deficiency 02/18/2017    Medications:  No current facility-administered medications on file prior to encounter.   Current Outpatient Medications on File Prior to Encounter  Medication Sig Dispense Refill   clopidogrel (PLAVIX) 75 MG tablet Take 75 mg by mouth daily.     furosemide (LASIX) 40 MG tablet Take 40 mg by mouth daily.     insulin aspart (NOVOLOG) 100 UNIT/ML injection Substitute to any brand approved.Before each  meal 3 times a day, 140-199 - 2 units, 200-250 - 4 units, 251-299 - 6 units,  300-349 - 8 units,  350 or above 10 units. Dispense syringes and needles as needed, Ok to switch to PEN if approved. DX DM2, Code E11.65 (Patient taking differently: Substitute to any brand approved.Before each meal 3 times a day, 140-199 - 2 units, 200-250 - 4 units, 251-299 - 6 units,  300-349 - 8 units,  350 or above 10 units. Dispense syringes and needles as needed, Ok to switch to PEN if approved. DX DM2, Code E11.65) 10 mL 0   insulin glargine (LANTUS) 100 UNIT/ML Solostar Pen Inject 60 Units into the skin daily before breakfast.     metoprolol succinate (TOPROL-XL) 50 MG 24 hr tablet Take 1 tablet (50 mg total) by mouth in the morning and at bedtime. Take with or immediately following a meal. 180 tablet 1   mycophenolate (CELLCEPT) 500 MG tablet Take 1,000 mg by mouth 2 (two) times daily.     nitroGLYCERIN (NITROSTAT) 0.4 MG SL tablet PLACE 1 TABLET UNDER THE TONGUE EVERY 5 MINUTES AS NEEDED FOR CHEST PAIN 25 tablet 0   OZEMPIC, 1 MG/DOSE, 4 MG/3ML SOPN Inject 1 mg into the skin once a week.     pantoprazole (PROTONIX) 40 MG  tablet Take 40 mg by mouth daily.     predniSONE (DELTASONE) 5 MG tablet 8 Pills PO for 3 days,6 Pills PO for 3 days,4 Pills PO for 3 days,2 Pills PO for 3 days,1 Pills PO for 3 days,1/2 Pill  PO for 3 days then STOP. (Patient taking differently: Take 5 mg by mouth daily with breakfast.) 65 tablet 0   ranolazine (RANEXA) 500 MG 12 hr tablet TAKE ONE TABLET BY MOUTH TWICE DAILY.PLEASE MAKE APPT WITH DR.HARDING FOR REFILLS.  (Patient taking differently: Take 500 mg by mouth daily.) 60 tablet 2   vitamin B-12 (CYANOCOBALAMIN) 1000 MCG tablet TAKE 1 TABLET (1,000 MCG TOTAL) BY MOUTH DAILY. 30 tablet 0   warfarin (COUMADIN) 5 MG tablet TAKE 1 TABLET (5 MG TOTAL) BY MOUTH DAILY AT 4 PM. (Patient taking differently: Take 2.5-5 mg by mouth See admin instructions. Take 1 tablet (5 mg) daily except on Sun Take  1/2 tablet (2.5 mg)) 7 tablet 0   Insulin Syringe-Needle U-100 25G X 1" 1 ML MISC For 4 times a day insulin SQ, 1 month supply. Diagnosis E11.65 30 each 0   rosuvastatin (CRESTOR) 40 MG tablet Take 1 tablet (40 mg total) by mouth daily. 90 tablet 3     Assessment: 65 y.o. male admitted with SOB/CHF. Patient is on warfarin prior to admission for DVT in February 2022. Pharmacy consulted to dose warfarin. Patient reports missing 2-3 doses last week but does not routinely miss doses. INR 1.2.  Prior to admission warfarin regimen: 5mg  daily except 2.5mg  on Sundays   Goal of Therapy:  INR 2-3 Monitor platelets by anticoagulation protocol: Yes   Plan:  Warfarin 5mg  x1  Monitor daily INR  Cristela Felt, PharmD Clinical Pharmacist  01/27/2021,9:48 AM

## 2021-01-27 NOTE — Evaluation (Signed)
Physical Therapy Evaluation Patient Details Name: Eugene [name] MRN: [id number] DOB: [birth year] Today's Date: 01/27/2021   History of Present Illness  Pt is a 65 y.o. male admitted 01/26/2021 with ongoing intermitten shortness of breath. CXR with diffuse reticular pattern bilaterally consistent with ILD with mild cardiomegaly. PMH includes CVA, CAD s/p CABG, CHF, CKD 3B, DDD, diabetes, GERD, hyperlipidemia, peripheral arterial disease, hypertension, long QT, V. fib status post AICD, ILD, sleep apnea, CAD (s/p CABG, PCI; vfib arrest s/p ICD), CKD IV, HTN, DM.  Clinical Impression  Pt performs bed mobility, transfers, and gait of household distances without physical assist. Pt does not use supplemental o2 at baseline, but on 4 L [place] on PT arrival. Pt weaned to RA and desats to 82%. Pt placed on 6 L with mobility and satting at mid 61s. Pt currently limited in mobility secondary to oxygen sat levels and demonstrating increased WOB. Pt demonstrates generalized weakness and deficits in functional mobility and activity tolerance and will benefit from acute PT to improve activity tolerance. SPT recommends HHPT to aid in return to prior level.    Follow Up Recommendations Home health PT    Equipment Recommendations  None recommended by PT    Recommendations for Other Services       Precautions / Restrictions Precautions Precautions: Fall Restrictions Weight Bearing Restrictions: No      Mobility  Bed Mobility Overal bed mobility: Modified Independent Bed Mobility: Supine to Sit     Supine to sit: HOB elevated;Modified independent (Device/Increase time)     General bed mobility comments: OOB on arrival    Transfers Overall transfer level: Modified independent Equipment used: None Transfers: Sit to/from Stand Sit to Stand: Supervision         General transfer comment: needs further education on line management  Ambulation/Gait Ambulation/Gait assistance: Min guard Gait Distance  (Feet): 200 Feet Assistive device: None Gait Pattern/deviations: Wide base of support;Decreased stride length;Drifts right/left Gait velocity: reduced Gait velocity interpretation: 1.31 - 2.62 ft/sec, indicative of limited community ambulator General Gait Details: Pt with slow step-through gait, drifting R/L. Per pt, PTA pt drifts with mobility.  Stairs            Wheelchair Mobility    Modified Rankin (Stroke Patients Only)       Balance Overall balance assessment: Needs assistance Sitting-balance support: Feet supported Sitting balance-Leahy Scale: Fair     Standing balance support: During functional activity Standing balance-Leahy Scale: Fair Standing balance comment: Pt maintains static and dynamic standing balance without reliance of UE support                             Pertinent Vitals/Pain Pain Assessment: No/denies pain    Home Living Family/patient expects to be discharged to:: Private residence Living Arrangements: Spouse/significant other Available Help at Discharge: Family;Available 24 hours/day Type of Home: House Home Access: Stairs to enter;Ramped entrance Entrance Stairs-Rails:  (Pt uses ramped entrance) Entrance Stairs-Number of Steps:  (Pt uses ramped entrance) Home Layout: One level Home Equipment: Toilet riser;Shower seat;Walker - 2 wheels;Walker - 4 wheels;Cane - single point;Grab bars - toilet;Grab bars - tub/shower Additional Comments: x2 dogs ( german shepard and terrier- reports the male terrier doesnt like him) recently retired    Prior Function Level of Independence: Independent         Comments: PTA pt independent with ADLs and without AD for mobility.     Hand Dominance   Dominant  Hand: Right    Extremity/Trunk Assessment   Upper Extremity Assessment Upper Extremity Assessment: Overall WFL for tasks assessed    Lower Extremity Assessment Lower Extremity Assessment: Defer to PT evaluation    Cervical /  Trunk Assessment Cervical / Trunk Assessment: Normal  Communication   Communication: No difficulties  Cognition Arousal/Alertness: Awake/alert Behavior During Therapy: WFL for tasks assessed/performed Overall Cognitive Status: Within Functional Limits for tasks assessed                                        General Comments General comments (skin integrity, edema, etc.): 2L O2 sitting in chair with sats > 96% on diamap.    Exercises     Assessment/Plan    PT Assessment Patient needs continued PT services  PT Problem List Decreased strength;Decreased activity tolerance;Decreased balance;Decreased mobility;Decreased knowledge of use of DME;Decreased knowledge of precautions;Cardiopulmonary status limiting activity       PT Treatment Interventions DME instruction;Gait training;Functional mobility training;Stair training;Therapeutic activities;Therapeutic exercise;Balance training;Patient/family education    PT Goals (Current goals can be found in the Care Plan section)  Acute Rehab PT Goals Patient Stated Goal: to be able to move the yard again PT Goal Formulation: With patient Time For Goal Achievement: 02/10/21 Potential to Achieve Goals: Good    Frequency Min 3X/week   Barriers to discharge        Co-evaluation               AM-PAC PT "6 Clicks" Mobility  Outcome Measure Help needed turning from your back to your side while in a flat bed without using bedrails?: None Help needed moving from lying on your back to sitting on the side of a flat bed without using bedrails?: None Help needed moving to and from a bed to a chair (including a wheelchair)?: A Little Help needed standing up from a chair using your arms (e.g., wheelchair or bedside chair)?: A Little Help needed to walk in hospital room?: A Little Help needed climbing 3-5 steps with a railing? : A Lot 6 Click Score: 19    End of Session Equipment Utilized During Treatment: Gait  belt;Oxygen Activity Tolerance: Patient limited by fatigue;Treatment limited secondary to medical complications (Comment) (o2 sats) Patient left: in chair;with call bell/phone within reach;Other (comment) (working with OT) Nurse Communication: Mobility status;Other (comment) (o2) PT Visit Diagnosis: Other abnormalities of gait and mobility (R26.89);Muscle weakness (generalized) (M62.81);Difficulty in walking, not elsewhere classified (R26.2)    Time: 8372-9021 PT Time Calculation (min) (ACUTE ONLY): 33 min   Charges:   PT Evaluation $PT Eval Low Complexity: 1 Low          Acute Rehab  Pager: [phone]   Garwin Brothers, SPT  01/27/2021, 12:44 PM

## 2021-01-27 NOTE — Progress Notes (Signed)
K noted to 3.4 this AM -> received potassium repletion with 25meq KCl in the context of renal insufficiency. Given plan for ongoing diuresis, will follow-up BMET this evening and will s/o to on-call fellow to review and replete further if needed.

## 2021-01-27 NOTE — Consult Note (Addendum)
Cardiology Consultation:   Patient ID: Eugene [name] MRN: [id number]; DOB: 1956/04/26  Admit date: 01/26/2021 Date of Consult: 01/27/2021  PCP:  [name], MD   [place] [facility] Cardiologist:  Werner Lean, MD        Patient Profile:   Eugene [name] is a 65 y.o. male with a hx of CAD s/p CABG and PCIs, VF arrest s/p STJ ICD implanted 2012, chronic combined CHF, obesity, HTN, DM, reported TIA/CVA, CKD stage IIIb, PAF, DVT, possible ILD, abnormal CK/rheum markers 08/2020, transaminitis, diverticular disease, dyslipidemia, anxiety, DDD, PAD (details unclear) who is being seen 01/27/2021 for the evaluation of CHF at the request of Dr. Tawanna Solo.  History of Present Illness:   Eugene [name] underwent remote CABG 2006. In 2012 he suffered an inferior MI with VF arrest with unsuccessful PCI to the occluded SVG-RCA graft. EF was normal. ICD was placed. ILR was implanted 2016 2/2 cryptogenic stroke with EOS 12/21/2017. EF appears relatively preserved over the years except intermittently in the 40% range. Stress test in 2019 was abnormal with EF 42%, followed up by heart cath with new CTO-SVG-RI treated medically. In 06/2020 he had another cardiac cath resulting in PCI/DES to SVG-OM2 and PCI/DES to OM2. He was readmitted 08/2020 with multiple medical issues including NSTEMI, bilateral lower extremity weakness with elevated CK/ESR and positive aldolase and double-stranded DNA, incidental Covid-infection with evidence of interstitial lung disease/possible pulmonary fibrosis on imaging, acute lower extremity DVT, paroxysmal atrial fib, transaminitis, UTI, AKI superimposed on CKD stage IV, B12 deficiency. All of this was superimposed on 40-50lb unintentional weight loss for which outpatient follow-up was suggested (colonoscopy had been deferred due to recent PCI). He underwent repeat cath showing early ISR in the SVG-OM2 with difficult but ultimately successful intervention through the SVG-OM2  requiring PTCA. Given ISR on Plavix he was switched to Brilinta. 2D echo during that admission showed EF 45-50% with hypokinetic anterior septum, inferior wall, basal inferoseptal segment, and apex. He was discharged on Lovenox/Coumadin for his DVT with plans for PCP to follow this.  Antiplatelet regimen is challenging to follow since that time. He saw Dr. Gasper Sells in follow-up 09/21/20. Eventual muscle biopsy was planned by rheumatology. At that visit, ASA and Brilinta were discontinued and he was started on Plavix and continued on Coumadin to be followed by PCP. Per phone note 09/22/20 with Dr. Gasper Sells discussing with rheumatology, the plan was instead to restart ASA 60m and Brilinta, stop Plavix, stop Lipitor, and defer biopsy for 1 month. [facility] given contraindications with statin and elevated CK. He had the biopsy done 11/01/20. 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 The patient states he was started on mycophenolate to help "something in my chest." In a follow-up visit 11/16/20 with RTommye Standard she noted he had had an ICD shock 11/13/20 felt possibly a dual tachycardia with rapid AF/VT. He had possibly missed his metoprolol the day of the arrhythmia. Her note indicates he was on ASA and warfarin at that time, had stopped Brilinta for a couple of weeks. He was not on Plavix at that visit either. Per her discussion with Dr. CGasper Sellsthey stopped ASA, started Brilinta back, and increased metoprolol. Per 01/02/21 phone note with pharmD, patient reported muscle bx negative so PCSK9 was switched back to Crestor. Per 01/22/21 phone note he had a Merlin alert for VF terminated with 1 shock, further recs pending with EP follow-up arranged. The patient was  sleeping at time of event and this woke him up.  He returned to the ED with worsening SOB, persistent dry cough, orthopnea, and edema. He was found to be hypoxic at 83% on  RA requiring supplemental O2 via . Med list now has Plavix again, no Brilinta. He states he thinks his PCP switched him because Brilinta was causing anxiety attacks (do not have access to [facility] records). INR is only 1.1. He states it was low the last time he had it checked as well, and he had reported to his doctor that he had perhaps missed a few days' doses of Coumadin. BNP 419. hsTroponin flat at 59-59. Otherwise labs notable for Cr 1.93->1.68, leukocyotsis 18.3->14.9. Covid negative, CXR with diffuse bilateral reticular opacity consistent with chronic interstitial lung disease as demonstrated on prior CT, no acute confluent airspace disease, mild cardiomegaly. Echo is pending. He is being diuresed by the medicine team. Has not seen pulmonology.  - Weight 171.3lb as OP in 10/2020 - No admit weight only stated weight 170lb - F/u weight today 173.5lb   Past Medical History:  Diagnosis Date   Abnormal nuclear stress test 02/19/2018   Angina, class III ([place]) 02/19/2018   Anxiety    Chronic combined systolic and diastolic CHF (congestive heart failure) (HCC)    Chronic kidney disease, stage 3b (HCC)    Coronary artery disease    DDD (degenerative disc disease), cervical 03/01/2013   Diabetes mellitus    type 2   Diverticular disease 01/12/2014   mild   DVT (deep venous thrombosis) (HCC)    Dyslipidemia    Hypertension    ICD (implantable cardioverter-defibrillator) in place    ILD (interstitial lung disease) ([place])    Lumbosacral radiculopathy at L4 02/18/2017   Myocardial infarction ([place]) 2012   Obesity    Snoring 10/12/2013   TIA (transient ischemic attack) 08/09/2014   Tubular adenoma of colon 01/12/2014   transverse colon   VF (ventricular fibrillation) ([place])    arrest   Vitamin D deficiency 02/18/2017    Past Surgical History:  Procedure Laterality Date   ABDOMINAL AORTAGRAM N/A 12/14/2013   Procedure: ABDOMINAL Maxcine Ham;  Surgeon: [name], MD;  Location:  [facility] CATH LAB;  Service: Cardiovascular;  Laterality: N/A;   CARDIAC CATHETERIZATION  09/16/10   COLONOSCOPY  01/12/2014   CORONARY ARTERY BYPASS GRAFT  2006   CORONARY BALLOON ANGIOPLASTY N/A 09/12/2020   Procedure: CORONARY BALLOON ANGIOPLASTY;  Surgeon: Troy Sine, MD;  Location:  [place] CV LAB;  Service: Cardiovascular;  Laterality: N/A;   CORONARY STENT INTERVENTION  07/06/2020   CORONARY STENT INTERVENTION N/A 07/06/2020   Procedure: CORONARY STENT INTERVENTION;  Surgeon: Nelva Bush, MD;  Location: [place] CV LAB;  Service: Cardiovascular;  Laterality: N/A;   IMPLANTABLE CARDIOVERTER DEFIBRILLATOR IMPLANT  2012   STJ ICD- single chamber   LEFT HEART CATH AND CORS/GRAFTS ANGIOGRAPHY N/A 02/19/2018   Procedure: LEFT HEART CATH AND CORS/GRAFTS ANGIOGRAPHY;  Surgeon: [name], MD;  Location: [place] CV LAB;  Service: Cardiovascular;  Laterality: N/A;   LEFT HEART CATH AND CORS/GRAFTS ANGIOGRAPHY N/A 07/06/2020   Procedure: LEFT HEART CATH AND CORS/GRAFTS ANGIOGRAPHY;  Surgeon: Nelva Bush, MD;  Location: [place] CV LAB;  Service: Cardiovascular;  Laterality: N/A;   LEFT HEART CATH AND CORS/GRAFTS ANGIOGRAPHY N/A 09/12/2020   Procedure: LEFT HEART CATH AND CORS/GRAFTS ANGIOGRAPHY;  Surgeon: Troy Sine, MD;  Location: [place] CV LAB;  Service: Cardiovascular;  Laterality: N/A;  LOOP RECORDER IMPLANT N/A 08/24/2014   Procedure: LOOP RECORDER IMPLANT;  Surgeon: [name], MD;  Location: [facility] CATH LAB;  Service: Cardiovascular;  Laterality: N/A;   MASS EXCISION  2008   right shoulder   MUSCLE BIOPSY Left 11/01/2020   Procedure: THIGH MUSCLE BIOPSY;  Surgeon: Georganna Skeans, MD;  Location: [facility] ORS;  Service: General;  Laterality: Left;   STERNECTOMY       Home Medications:  Prior to Admission medications   Medication Sig Start Date End Date Taking? Authorizing Provider  clopidogrel (PLAVIX) 75 MG tablet Take 75 mg by mouth daily.   Yes [provider]  furosemide (LASIX) 40 MG tablet Take 40 mg by mouth daily.   Yes [provider]  insulin aspart (NOVOLOG) 100 UNIT/ML injection Substitute to any brand approved.Before each meal 3 times a day, 140-199 - 2 units, 200-250 - 4 units, 251-299 - 6 units,  300-349 - 8 units,  350 or above 10 units. Dispense syringes and needles as needed, Ok to switch to PEN if approved. DX DM2, Code E11.65 Patient taking differently: Substitute to any brand approved.Before each meal 3 times a day, 140-199 - 2 units, 200-250 - 4 units, 251-299 - 6 units,  300-349 - 8 units,  350 or above 10 units. Dispense syringes and needles as needed, Ok to switch to PEN if approved. DX DM2, Code E11.65 09/16/20  Yes Thurnell Lose, MD  insulin glargine (LANTUS) 100 UNIT/ML Solostar Pen Inject 60 Units into the skin daily before breakfast. 11/19/16  Yes [provider]  metoprolol succinate (TOPROL-XL) 50 MG 24 hr tablet Take 1 tablet (50 mg total) by mouth in the morning and at bedtime. Take with or immediately following a meal. 11/16/20  Yes Baldwin Jamaica, PA-C  mycophenolate (CELLCEPT) 500 MG tablet Take 1,000 mg by mouth 2 (two) times daily.   Yes [provider]  nitroGLYCERIN (NITROSTAT) 0.4 MG SL tablet PLACE 1 TABLET UNDER THE TONGUE EVERY 5 MINUTES AS NEEDED FOR CHEST PAIN 11/03/20  Yes Chandrasekhar, Mahesh A, MD  OZEMPIC, 1 MG/DOSE, 4 MG/3ML SOPN Inject 1 mg into the skin once a week. 01/23/21  Yes [provider]  pantoprazole (PROTONIX) 40 MG tablet Take 40 mg by mouth daily.   Yes [provider]  predniSONE (DELTASONE) 5 MG tablet 8 Pills PO for 3 days,6 Pills PO for 3 days,4 Pills PO for 3 days,2 Pills PO for 3 days,1 Pills PO for 3 days,1/2 Pill  PO for 3 days then STOP. Patient taking differently: Take 5 mg by mouth daily with breakfast. 09/16/20  Yes Thurnell Lose, MD  ranolazine (RANEXA) 500 MG 12 hr tablet TAKE ONE TABLET BY MOUTH TWICE DAILY.PLEASE MAKE  APPT WITH DR.HARDING FOR REFILLS.  Patient taking differently: Take 500 mg by mouth daily. 04/15/19  Yes [name], MD  vitamin B-12 (CYANOCOBALAMIN) 1000 MCG tablet TAKE 1 TABLET (1,000 MCG TOTAL) BY MOUTH DAILY. 09/15/20 09/15/21 Yes Thurnell Lose, MD  warfarin (COUMADIN) 5 MG tablet TAKE 1 TABLET (5 MG TOTAL) BY MOUTH DAILY AT 4 PM. Patient taking differently: Take 2.5-5 mg by mouth See admin instructions. Take 1 tablet (5 mg) daily except on Sun Take 1/2 tablet (2.5 mg) 09/16/20 09/16/21 Yes Thurnell Lose, MD  Insulin Syringe-Needle U-100 25G X 1" 1 ML MISC For 4 times a day insulin SQ, 1 month supply. Diagnosis E11.65 09/16/20   Thurnell Lose, MD  rosuvastatin (CRESTOR) 40  MG tablet Take 1 tablet (40 mg total) by mouth daily. 01/02/21 04/02/21  Werner Lean, MD    Inpatient Medications: Scheduled Meds:  clopidogrel  75 mg Oral Daily   furosemide  40 mg Intravenous BID   guaiFENesin  600 mg Oral BID   insulin aspart  0-15 Units Subcutaneous TID WC   insulin aspart  0-5 Units Subcutaneous QHS   insulin glargine  30 Units Subcutaneous Daily   [START ON 01/28/2021] metoprolol succinate  50 mg Oral BID   mycophenolate  1,000 mg Oral BID   pantoprazole  40 mg Oral Daily   polyethylene glycol  17 g Oral Daily   predniSONE  5 mg Oral Q breakfast   ranolazine  500 mg Oral Daily   rosuvastatin  40 mg Oral Daily   sodium chloride flush  3 mL Intravenous Q12H   warfarin  5 mg Oral ONCE-1600   Warfarin - Pharmacist Dosing Inpatient   Does not apply q1600   Continuous Infusions:  PRN Meds: acetaminophen **OR** acetaminophen  Allergies:   No Known Allergies  Social History:   Social History   Socioeconomic History   Marital status: Single    Spouse name: Not on file   Number of children: 0   Years of education: Not on file   Highest education level: Not on file  Occupational History   Occupation: STORE MANAGER     Employer: [employer]    Comment: [place], [place]   Tobacco Use   Smoking status: Never   Smokeless tobacco: Never  Vaping Use   Vaping Use: Never used  Substance and Sexual Activity   Alcohol use: No   Drug use: No   Sexual activity: Not Currently  Other Topics Concern   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[place], [place]   6 caffeinated beverages daily   Social Determinants of Health   Financial Resource Strain: Not on file  Food Insecurity: Not on file  Transportation Needs: Not on file  Physical Activity: Not on file  Stress: Not on file  Social Connections: Not on file  Intimate Partner Violence: Not on file    Family History:    Family History  Problem Relation Age of Onset   Breast cancer Mother 53       mets to colon and other   Glaucoma Mother    Colon cancer Mother    Coronary artery disease Father        early 10's   Diabetes Father    Hypertension Father    Diabetes Sister    Prostate cancer Brother    Heart disease Brother    Esophageal cancer Neg Hx    Pancreatic cancer Neg Hx    Stomach cancer Neg Hx    Liver disease Neg Hx      ROS:  Please see the history of present illness.   All other ROS reviewed and negative.     Physical Exam/Data:   Vitals:   01/27/21 0224 01/27/21 0350 01/27/21 0752 01/27/21 1156  BP: 132/75 106/74 102/74 96/64  Pulse: 98 79 72 74  Resp: _0 Temp: 97.7 F (36.5 C) 98.2 F (36.8 C) 98.3 F (36.8 C) 97.6 F (36.4 C)  TempSrc: Oral Oral Oral Oral  SpO2: 93% 98% 100% 96%  Weight: 78.7 kg     Height: 5' 5" (1.651 m)  Intake/Output Summary (Last 24 hours) at 01/27/2021 1654 Last data filed at 01/27/2021 1156 Gross per 24 hour  Intake 0 ml  Output 900 ml  Net -900 ml   Last 3 Weights 01/27/2021 01/26/2021 01/12/2021  Weight (lbs) 173 lb 8 oz 170 lb 177 lb 9.6 oz  Weight (kg) 78.699 kg 77.111 kg 80.559 kg     Body mass index is 28.87 kg/m.  General: Well developed, well nourished WF  in no acute distress. Frequent  dry spastic coughing Head: Normocephalic, atraumatic, sclera non-icteric, no xanthomas, nares are without discharge. Neck: Negative for carotid bruits. JVP not elevated. Lungs: Diffusely diminished throughout, no rales or rhonchi or wheezing. Frequent spastic dry cough Heart: RRR S1 S2 without murmurs, rubs, or gallops.  Abdomen: Soft, non-tender, non-distended with normoactive bowel sounds. No rebound/guarding. Extremities: No clubbing or cyanosis. 1+ soft BLE edema. Distal pedal pulses are 2+ and equal bilaterally. Neuro: Alert and oriented X 3. Moves all extremities spontaneously. Psych:  Responds to questions appropriately with a normal affect.   EKG:  The EKG was personally reviewed and demonstrates:  NSR, right axis deviation, IRBBB, nonspecific STT changes. Relatively similar to prior. Telemetry:  Telemetry was personally reviewed and demonstrates:  NSR, brief ectopic atrial rhythm  Relevant CV Studies: 2D echo 09/14/20   1. Left ventricular ejection fraction, by estimation, is 45 to 50%. The  left ventricle has mildly decreased function. The left ventricle  demonstrates regional wall motion abnormalities (see scoring  diagram/findings for description).   2. Right ventricular systolic function is normal. The right ventricular  size is normal.   3. The mitral valve is normal in structure. Trivial mitral valve  regurgitation. No evidence of mitral stenosis.   4. The aortic valve is grossly normal. There is mild calcification of the  aortic valve. There is mild thickening of the aortic valve. Aortic valve  regurgitation is not visualized. No aortic stenosis is present.   Comparison(s): Prior images reviewed side by side.   Conclusion(s)/Recommendation(s): Llimited study for EF. Patient is now in  atrial fibrillation. The EF is mildly reduced, with slight focal wall  motion abnormalities as noted. Prior images viewed side by side. The  pattern is  very similar to prior, but  better seen with definity contrast on current study. Findings discussed  with Dr. Margaretann Loveless.   Cath 08/2020 Prox LAD lesion is 99% stenosed. Ost LAD to Prox LAD lesion is 100% stenosed. Mid LAD lesion is 80% stenosed. 1st Mrg lesion is 99% stenosed. Ost Cx to Prox Cx lesion is 80% stenosed. Mid Cx lesion is 100% stenosed. Balloon angioplasty was performed. Prox RCA to Dist RCA lesion is 100% stenosed with 100% stenosed side branch in RPAV. Ost Ramus lesion is 100% stenosed. SVG and is normal in caliber. Previously placed Prox Graft drug eluting stent is widely patent. LIMA and is large. The graft exhibits no disease. SVG and is normal in caliber. Origin to Prox Graft lesion is 100% stenosed. SVG and is large. Prox Graft to Dist Graft lesion is 100% stenosed. Dist Cx lesion is 95% stenosed. Mid LM to Dist LM lesion is 80% stenosed. Post intervention, there is a 20% residual stenosis.   Severe native coronary obstructive disease 80% distal left main stenosis, total occlusion of the LAD, ramus immediate vessel, high-grade circumflex stenoses, total occlusion of the proximal RCA.  There was a shunt to the distal RCA via left to right and right to right collateralization.   Patent LIMA graft supplying  the mid LAD with diffuse disease in the LAD after insertion.   Supplying the OM 2 vessel with widely patent stent in the proximal portion of the SVG.  There is 95% in-stent restenosis in the native OM 2 vessel beyond the anastomosis.   Old occlusion of the vein graft which had supplied the ramus vessel.   Old occlusion of the vein graft which had supplied the PDA.   Difficult but ultimately successful intervention through the SVG to the distal OM 2 vessel requiring PTCA, [name] Cutting Balloon, and ultimate noncompliant balloon dilatation with a 95% stenosis being reduced to less than 20%.   LVEDP: 11 mmHg   RECOMMENDATION: DAPT probably indefinitely.   Since the patient developed early in-stent restenosis on aspirin/Plavix, Brilinta was utilized for more aggressive antiplatelet therapy.  Medical therapy for concomitant CAD.  Optimal blood pressure control, diabetes management, and aggressive lipid therapy with target LDL less than 70.    Laboratory Data:  High Sensitivity Troponin:   Recent Labs  Lab 01/26/21 1426 01/26/21 1728  TROPONINIHS 59* 59*     Chemistry Recent Labs  Lab 01/26/21 1426 01/27/21 0352  NA 137 137  K 4.6 3.4*  CL 101 102  CO2 25 24  GLUCOSE 187* 176*  BUN 28* 25*  CREATININE 1.93* 1.68*  CALCIUM 9.2 8.9  GFRNONAA 38* 45*  ANIONGAP 11 11    No results for input(s): PROT, ALBUMIN, AST, ALT, ALKPHOS, BILITOT in the last 168 hours. Hematology Recent Labs  Lab 01/26/21 1426 01/27/21 0352  WBC 18.3* 14.9*  RBC 5.14 4.87  HGB 14.8 14.1  HCT 47.6 43.4  MCV 92.6 89.1  MCH 28.8 29.0  MCHC 31.1 32.5  RDW 14.9 14.6  PLT 339 292   BNP Recent Labs  Lab 01/26/21 1431  BNP 419.9*    DDimer No results for input(s): DDIMER in the last 168 hours.   Radiology/Studies:  DG Chest 2 View  Result Date: 01/26/2021 CLINICAL DATA:  Shortness of breath and cough EXAM: CHEST - 2 VIEW COMPARISON:  09/08/2020, CT 09/09/2020 FINDINGS: Post sternotomy changes. Left-sided pacing device as before. Recording device over the left chest. Diffuse bilateral reticular opacity consistent with chronic interstitial lung disease. No acute superimposed airspace disease. Mild cardiomegaly. No pleural effusion or pneumothorax. IMPRESSION: 1. Diffuse bilateral reticular opacity consistent with chronic interstitial lung disease as demonstrated on prior CT. No acute confluent airspace disease. 2. Mild cardiomegaly Electronically Signed   By: Donavan Foil M.D.   On: 01/26/2021 15:19   ECHOCARDIOGRAM COMPLETE  Result Date: 01/27/2021    ECHOCARDIOGRAM REPORT   Patient Name:   KHYE HOCHSTETLER [facility] Date of Exam: 01/27/2021 Medical Rec #:  [id number]     Height:       65.0 in Accession #:    [id number]   Weight:       173.5 lb Date of Birth:  Apr 23, 1956   BSA:          1.862 m Patient Age:    89 years     BP:           96/64 mmHg Patient Gender: M            HR:           76 bpm. Exam Location:  Inpatient Procedure: 2D Echo, Color Doppler, Cardiac Doppler and Intracardiac            Opacification Agent Indications:    W29.56 Acute systolic (congestive) heart failure  History:  Patient has prior history of Echocardiogram examinations, most                 recent 09/14/2020. CHF, CAD, Defibrillator; Risk                 Factors:Hypertension, Diabetes, Dyslipidemia, Sleep Apnea and                 COVID+ 08/2020.  Sonographer:    [place] Referring Phys: [phone] AMRIT ADHIKARI IMPRESSIONS  1. Left ventricular ejection fraction, by estimation, is 45 to 50%. The left ventricle has mildly decreased function. The left ventricle demonstrates regional wall motion abnormalities.     There is hypokinesis of the basal-to-mid anteroseptal, basal-to-mid anterior, basal-to-mid inferior and basal inferoseptal LV walls. Left ventricular diastolic parameters are consistent with Grade I diastolic dysfunction (impaired relaxation).  2. Right ventricular systolic function is mildly reduced. The right ventricular size is not well visualized.  3. The mitral valve is grossly normal. Trivial mitral valve regurgitation. No evidence of mitral stenosis.  4. The aortic valve is tricuspid. There is mild calcification of the aortic valve. There is moderate thickening of the aortic valve. Aortic valve regurgitation is not visualized. Mild to moderate aortic valve sclerosis/calcification is present, without any evidence of aortic stenosis. Comparison(s): Compared to prior limited TTE in 09/14/20, there is no significant change. There continues to be WMA as described above. FINDINGS  Left Ventricle: Left ventricular ejection fraction, by estimation, is 45 to 50%. The left ventricle  has mildly decreased function. The left ventricle demonstrates regional wall motion abnormalities. Definity contrast agent was given IV to delineate the left ventricular endocardial borders. There is hypokinesis of the basal-to-mid anteroseptal, basal-to-mid anterior, basal-to-mid inferior and basal inferoseptal LV walls. The left ventricular internal cavity size was normal in size. There is no left ventricular hypertrophy. Left ventricular diastolic parameters are consistent with Grade I diastolic dysfunction (impaired relaxation). Right Ventricle: The right ventricular size is not well visualized. Right vetricular wall thickness was not well visualized. Right ventricular systolic function is mildly reduced. Left Atrium: Left atrial size was normal in size. Right Atrium: Right atrial size was normal in size. Pericardium: There is no evidence of pericardial effusion. Mitral Valve: The mitral valve is grossly normal. There is mild thickening of the mitral valve leaflet(s). There is mild calcification of the mitral valve leaflet(s). Mild to moderate mitral annular calcification. Trivial mitral valve regurgitation. No evidence of mitral valve stenosis. Tricuspid Valve: The tricuspid valve is normal in structure. Tricuspid valve regurgitation is trivial. Aortic Valve: The aortic valve is tricuspid. There is mild calcification of the aortic valve. There is moderate thickening of the aortic valve. Aortic valve regurgitation is not visualized. Mild to moderate aortic valve sclerosis/calcification is present, without any evidence of aortic stenosis. Pulmonic Valve: The pulmonic valve was not well visualized. Pulmonic valve regurgitation is trivial. Aorta: The aortic root and ascending aorta are structurally normal, with no evidence of dilitation. IAS/Shunts: No atrial level shunt detected by color flow Doppler. Additional Comments: A device lead is visualized.  LEFT VENTRICLE PLAX 2D LVIDd:         4.20 cm  Diastology  LVIDs:         3.50 cm  LV e' medial:    3.90 cm/s LV PW:         0.90 cm  LV E/e' medial:  18.9 LV IVS:        0.80 cm  LV e' lateral:  4.95 cm/s LVOT diam:     2.00 cm  LV E/e' lateral: 14.9 LV SV:         39 LV SV Index:   21 LVOT Area:     3.14 cm  RIGHT VENTRICLE RV S prime:     5.78 cm/s TAPSE (M-mode): 1.7 cm LEFT ATRIUM             Index       RIGHT ATRIUM           Index LA diam:        3.50 cm 1.88 cm/m  RA Area:     19.30 cm LA Vol (A2C):   28.2 ml 15.14 ml/m RA Volume:   53.00 ml  28.46 ml/m LA Vol (A4C):   46.7 ml 25.08 ml/m LA Biplane Vol: 35.9 ml 19.28 ml/m  AORTIC VALVE LVOT Vmax:   67.90 cm/s LVOT Vmean:  47.800 cm/s LVOT VTI:    0.124 m  AORTA Ao Root diam: 3.40 cm Ao Asc diam:  3.00 cm MITRAL VALVE MV Area (PHT): 2.83 cm     SHUNTS MV Decel Time: 268 msec     Systemic VTI:  0.12 m MV E velocity: 73.90 cm/s   Systemic Diam: 2.00 cm MV A velocity: 102.00 cm/s MV E/A ratio:  0.72 Gwyndolyn Kaufman MD Electronically signed by Gwyndolyn Kaufman MD Signature Date/Time: 01/27/2021/3:32:43 PM    Final      Assessment and Plan:   1. Acute hypoxic respiratory failure, suspect multifactorial - I am concerned that this is not garden variety CHF given multiple other issues including recent development of interstitial lung disease, significant unintentional weight loss, elevated CK/rheumatologic markers - suspect some volume is contributing but would suggest pulmonary consultation as well given the degree of hypoxia out of proportion to his volume status - question whether there could be an infiltrative cardiomyopathy unifying his diagnosis - reasonable to continue IV Lasix to return to dry weight - repeat echo pending - will d/w Dr. Johney Frame  2. CAD, with LDL goal LDL <70 - very challenging to follow recent antiplatelet issue - Brilinta previously chosen due to early in-stent restenosis while on Plavix, although now seeing the issue with INR raises question of perhaps medication lapses as  well - continue metoprolol 70m BID (pt confirms taking this way) - patient previously taken off statin due to elevated CK, then started on PCSK9i, then had LDL 117, so per 12/2020 phone note discontinued PCSK9i and asked to restart Crestor although I am not sure this was cleared with rheumatology at the time - it seems unusual that his LDL would be in the 40s on statin but then 117 on PCSK9 - ? adherence - will clarify antiplatelet and statin recs with MD  3. History of recent DVT 08/2020 - consider coverage with heparin per pharmacy given markedly subtherapeutic INR and intermittent missed dosing - per d/w IM, PE is felt less likely at this time  4. Paroxysmal atrial fib - maintaining NSR on telemetry - anticoag plan as above - per chart, Lovenox/warfarin was previously preferred due to concomitant Brilinta at the time, query whether DOAC would be more suitable although INR gives the opportunity to monitor compliance/potential failure   5. Recent ICD shocks (10/2020, 01/19/21) with prior history of VF arrest s/p ICD 2012 - continue metoprolol - recommend to keep K 4.0 or greater and magnesium 2.0 or greater - will review with MD, may need EP input while admitted  6.  Essential HTN - manage in context above  7. CKD stage IIIb - follow   Risk Assessment/Risk Scores:       New York Heart Association (NYHA) Functional Class NYHA Class IV  CHA2DS2-VASc Score = 7  This indicates a 11.2% annual risk of stroke. The patient's score is based upon: CHF History: Yes HTN History: Yes Diabetes History: Yes Stroke History: Yes Vascular Disease History: Yes Age Score: 1 Gender Score: 0     For questions or updates, please contact [place] Please consult [email or web address] for contact info under    Signed, Charlie Pitter, PA-C  01/27/2021 4:54 PM  Patient seen and examined and agree with Melina Copa, PA-C as detailed above.  In brief, the patient is a 65 y.o. male with a hx of CAD  s/p CABG and multiple subsequent PCIs, VF arrest s/p STJ ICD implanted 2012, chronic combined CHF, obesity, HTN, DM, reported TIA/CVA, CKD stage IIIb, PAF on warfarin, DVT, possible ILD, abnormal CK/rheum markers 08/2020, transaminitis, diverticular disease, dyslipidemia, anxiety, DDD, PAD who presented with shortness of breath and cough found to have BNP 419 consistent with acute on chronic combined heart failure exacerbation.  As detailed in the HPI, the patient has a complex cardiac history with history of CAD s/p CABG with multiple subsequent PCIs. Had admission in 08/2020 with NSTEMI found to have ISR s/p PTCA. Has had a lot of confusion about antiplatelet and AC therapy post-cath. Was recommended for ticagrelor and warfarin but ultimately the patient has not tolerated the ticagrelor due to SOB and was switched back to plavix and warfarin per his PCP. Notably, his INR was 1.1 and the patient admits to missing several doses. He also was taken off  his repatha and placed back on crestor due to ? Lack of response to repatha, however, there is concern about medication adherence as it appears multiple instructions have been given to the patient by multiple different providers.   He is also undergoing rheumatologic work-up for elevated CK/ESR and positive aldolase and double-stranded DNA with evidence of interstitial lung disease/possible pulmonary fibrosis on imaging. Has been placed on MMF and prednisone per rheum however notes not available to review.  Patient also has had a recent ICD shock on 6/27 (prior discharge on 11/13/20). This occurred in the setting of worsening volume overload as detailed above and he has not yet followed up with EP. TTE this admission relatively unchanged from prior with LVEF 40-45% with multiple WMA. Trop flat and patient has no chest pain. Low suspicion for ACS driving acute presentation.  Overall, this is a complex case with multiple ongoing issues and concern for medication  confusion and likely noncompliance. He is currently volume overloaded on exam for which he is being diuresed with lasix 63m IV BID. Given his underlying rheumatologic condition, multiple wall motion abnormalities, and recent ICD discharges, there is also concern for possible infiltrative cardiomyopathic process and would recommend CMR (if able pending renal function) for further evaluation. Agree that degree of symptoms and hypoxia seems out-of-proportion to volume status with concern that ILD may be contributing as well. Going forward, we will need to simplify his medication regimen to ensure better compliance and avoid confusion.   Exam: GEN: Comfortable, NAD  Neck: JVD difficult to assess due to body habitus Cardiac: RRR, no murmurs, rubs, or gallops.  Respiratory: Crackles at the bases; dry cough GI: Soft, nontender, non-distended  MS: 1+ pitting edema to the ankles Neuro:  Nonfocal  Psych: Normal affect  Plan: -Continue lasix 79m IV BID for diuresis -If able (pending renal function), check CMR to assess for infiltrative cardiomyopathy given concern for underlying rheumatologic condition, multiple ICD discharges and wall motion abnormalities on echo -Continue plavix 756mdaily and warfarin for AC--this will be his regimen going forward as did not tolerate ticagrelor -Start heparin gtt given high CHADs-vasc until INR therapeutic -Continue crestor 4062maily, repeat lipids in 6 weeks with goal LDL<70 -Continue metop 60m55m BID -Undergoing rheumatologic work-up, continue pred and MMF -Consider pulm consult for underlying ILD as degree of symptoms seems out-of-proportion to volume status -Monitor I/Os and daily weights -Low Na diet  Will need clear antiplatelet, anticoagulation and cholesterol medication instructions at discharge to ensure compliance.   HeatGwyndolyn Kaufman

## 2021-01-27 NOTE — Progress Notes (Signed)
ANTICOAGULATION CONSULT NOTE - Follow Up Consult  Pharmacy Consult for Coumadin Indication: H/O DVT  No Known Allergies  Patient Measurements: Height: 5\' 5"  (165.1 cm) Weight: 78.7 kg (173 lb 8 oz) IBW/kg (Calculated) : 61.5  Vital Signs: Temp: 97.6 F (36.4 C) (07/02 1156) Temp Source: Oral (07/02 1156) BP: 96/64 (07/02 1156) Pulse Rate: 74 (07/02 1156)  Labs: Recent Labs    01/26/21 1426 01/26/21 1728 01/27/21 0352  HGB 14.8  --  14.1  HCT 47.6  --  43.4  PLT 339  --  292  LABPROT 14.5  --  15.1  INR 1.1  --  1.2  CREATININE 1.93*  --  1.68*  TROPONINIHS 59* 59*  --      Estimated Creatinine Clearance: 42.4 mL/min (A) (by C-G formula based on SCr of 1.68 mg/dL (H)).   Medical History: Past Medical History:  Diagnosis Date   Abnormal nuclear stress test 02/19/2018   Angina, class III ([place]) 02/19/2018   Anxiety    Chronic combined systolic and diastolic CHF (congestive heart failure) (HCC)    Chronic kidney disease, stage 3b (HCC)    Coronary artery disease    DDD (degenerative disc disease), cervical 03/01/2013   Diabetes mellitus    type 2   Diverticular disease 01/12/2014   mild   DVT (deep venous thrombosis) (HCC)    Dyslipidemia    Hypertension    ICD (implantable cardioverter-defibrillator) in place    ILD (interstitial lung disease) ([place])    Lumbosacral radiculopathy at L4 02/18/2017   Myocardial infarction ([place]) 2012   Obesity    Snoring 10/12/2013   TIA (transient ischemic attack) 08/09/2014   Tubular adenoma of colon 01/12/2014   transverse colon   VF (ventricular fibrillation) ([place])    arrest   Vitamin D deficiency 02/18/2017    Medications:  No current facility-administered medications on file prior to encounter.   Current Outpatient Medications on File Prior to Encounter  Medication Sig Dispense Refill   clopidogrel (PLAVIX) 75 MG tablet Take 75 mg by mouth daily.     furosemide (LASIX) 40 MG tablet Take 40 mg by mouth daily.      insulin aspart (NOVOLOG) 100 UNIT/ML injection Substitute to any brand approved.Before each meal 3 times a day, 140-199 - 2 units, 200-250 - 4 units, 251-299 - 6 units,  300-349 - 8 units,  350 or above 10 units. Dispense syringes and needles as needed, Ok to switch to PEN if approved. DX DM2, Code E11.65 (Patient taking differently: Substitute to any brand approved.Before each meal 3 times a day, 140-199 - 2 units, 200-250 - 4 units, 251-299 - 6 units,  300-349 - 8 units,  350 or above 10 units. Dispense syringes and needles as needed, Ok to switch to PEN if approved. DX DM2, Code E11.65) 10 mL 0   insulin glargine (LANTUS) 100 UNIT/ML Solostar Pen Inject 60 Units into the skin daily before breakfast.     metoprolol succinate (TOPROL-XL) 50 MG 24 hr tablet Take 1 tablet (50 mg total) by mouth in the morning and at bedtime. Take with or immediately following a meal. 180 tablet 1   mycophenolate (CELLCEPT) 500 MG tablet Take 1,000 mg by mouth 2 (two) times daily.     nitroGLYCERIN (NITROSTAT) 0.4 MG SL tablet PLACE 1 TABLET UNDER THE TONGUE EVERY 5 MINUTES AS NEEDED FOR CHEST PAIN 25 tablet 0   OZEMPIC, 1 MG/DOSE, 4 MG/3ML SOPN Inject 1 mg into the skin once  a week.     pantoprazole (PROTONIX) 40 MG tablet Take 40 mg by mouth daily.     predniSONE (DELTASONE) 5 MG tablet 8 Pills PO for 3 days,6 Pills PO for 3 days,4 Pills PO for 3 days,2 Pills PO for 3 days,1 Pills PO for 3 days,1/2 Pill  PO for 3 days then STOP. (Patient taking differently: Take 5 mg by mouth daily with breakfast.) 65 tablet 0   ranolazine (RANEXA) 500 MG 12 hr tablet TAKE ONE TABLET BY MOUTH TWICE DAILY.PLEASE MAKE APPT WITH DR.HARDING FOR REFILLS.  (Patient taking differently: Take 500 mg by mouth daily.) 60 tablet 2   vitamin B-12 (CYANOCOBALAMIN) 1000 MCG tablet TAKE 1 TABLET (1,000 MCG TOTAL) BY MOUTH DAILY. 30 tablet 0   warfarin (COUMADIN) 5 MG tablet TAKE 1 TABLET (5 MG TOTAL) BY MOUTH DAILY AT 4 PM. (Patient taking differently:  Take 2.5-5 mg by mouth See admin instructions. Take 1 tablet (5 mg) daily except on Sun Take 1/2 tablet (2.5 mg)) 7 tablet 0   Insulin Syringe-Needle U-100 25G X 1" 1 ML MISC For 4 times a day insulin SQ, 1 month supply. Diagnosis E11.65 30 each 0   rosuvastatin (CRESTOR) 40 MG tablet Take 1 tablet (40 mg total) by mouth daily. 90 tablet 3     Assessment: 65 y.o. male admitted with SOB/CHF. Patient is on warfarin prior to admission for DVT in February 2022. Pharmacy consulted to dose warfarin. Patient reports missing 2-3 doses last week but does not routinely miss doses. INR 1.2.  Prior to admission warfarin regimen: 5mg  daily except 2.5mg  on Sundays   Hgb 14.1, plt 292. INR 1.2. Given high CHADS-VASC and questionable compliance, will bridge with heparin until INR therapeutic.    Goal of Therapy:  INR 2-3 Monitor platelets by anticoagulation protocol: Yes   Plan:  Already received warfarin 5 mg tonight appropriately Will order heparin bolus of 3000 units then start heparin infusion at 1050 >> will order level in 6 hours Monitor daily INR, HL, and CBC  Antonietta Jewel, PharmD, [place] Pharmacist  Phone: [phone] 01/27/2021 6:06 PM  Please check AMION for all [place] phone numbers After 10:00 PM, call [place] [phone]

## 2021-01-27 NOTE — Evaluation (Signed)
Occupational Therapy Evaluation Patient Details Name: Eugene [name] MRN: [id number] DOB: 03-11-1956 Today's Date: 01/27/2021    History of Present Illness Pt is a 65 y.o. male admitted 01/26/2021 with ongoing intermitten shortness of breath. CXR with diffuse reticular pattern bilaterally consistent with ILD with mild cardiomegaly. PMH includes CVA, CAD s/p CABG, CHF, CKD 3B, DDD, diabetes, GERD, hyperlipidemia, peripheral arterial disease, hypertension, long QT, V. fib status post AICD, ILD, sleep apnea, CAD (s/p CABG, PCI; vfib arrest s/p ICD), CKD IV, HTN, DM.   Clinical Impression   PT admitted with SOB . Pt currently with functional limitiations due to the deficits listed below (see OT problem list). Pt baseline no oxygen and recently retired from working as a Dance movement psychotherapist. Pt currently requires 2L 02 sitting down from 4L on arrival. Pt educated on [facility] and handout provided.  Pt will benefit from skilled OT to increase their independence and safety with adls and balance to allow discharge Home.     Follow Up Recommendations  No OT follow up    Equipment Recommendations  None recommended by OT    Recommendations for Other Services       Precautions / Restrictions Precautions Precautions: Fall Restrictions Weight Bearing Restrictions: No      Mobility Bed Mobility Overal bed mobility: Modified Independent Bed Mobility: Supine to Sit     Supine to sit: HOB elevated;Modified independent (Device/Increase time)     General bed mobility comments: OOB on arrival    Transfers Overall transfer level: Modified independent Equipment used: None Transfers: Sit to/from Stand Sit to Stand: Supervision         General transfer comment: needs further education on line management    Balance Overall balance assessment: Needs assistance Sitting-balance support: Feet supported Sitting balance-Leahy Scale: Fair     Standing balance support: During functional activity Standing  balance-Leahy Scale: Fair Standing balance comment: Pt maintains static and dynamic standing balance without reliance of UE support                           ADL either performed or assessed with clinical judgement   ADL Overall ADL's : Needs assistance/impaired                         Toilet Transfer: Modified Independent           Functional mobility during ADLs: Modified independent General ADL Comments: EC education provided with handout to help with recovery at home. pt advised to keep a journal daily of weights until follow up to help with fluid volumes. pt reports doing it only once week or less at this time. Pt will pick 2 EC techniques to focus on using upon d/c home     Vision Baseline Vision/History: Wears glasses Wears Glasses: At all times       Perception     Praxis      Pertinent Vitals/Pain Pain Assessment: No/denies pain     Hand Dominance Right   Extremity/Trunk Assessment Upper Extremity Assessment Upper Extremity Assessment: Overall WFL for tasks assessed   Lower Extremity Assessment Lower Extremity Assessment: Defer to PT evaluation   Cervical / Trunk Assessment Cervical / Trunk Assessment: Normal   Communication Communication Communication: No difficulties   Cognition Arousal/Alertness: Awake/alert Behavior During Therapy: WFL for tasks assessed/performed Overall Cognitive Status: Within Functional Limits for tasks assessed  General Comments  2L O2 sitting in chair with sats > 96% on diamap.    Exercises     Shoulder Instructions      Home Living Family/patient expects to be discharged to:: Private residence Living Arrangements: Spouse/significant other Available Help at Discharge: Family;Available 24 hours/day Type of Home: House Home Access: Stairs to enter;Ramped entrance Entrance Stairs-Number of Steps:  (Pt uses ramped entrance) Entrance Stairs-Rails:   (Pt uses ramped entrance) Home Layout: One level     Bathroom Shower/Tub: Occupational psychologist: Standard (with toilet riser)     Home Equipment: Toilet riser;Shower seat;Walker - 2 wheels;Walker - 4 wheels;Cane - single point;Grab bars - toilet;Grab bars - tub/shower   Additional Comments: x2 dogs ( german shepard and terrier- reports the male terrier doesnt like him) recently retired      Prior Functioning/Environment Level of Independence: Independent        Comments: PTA pt independent with ADLs and without AD for mobility.        OT Problem List: Decreased activity tolerance;Impaired balance (sitting and/or standing);Decreased safety awareness;Decreased knowledge of use of DME or AE;Decreased knowledge of precautions      OT Treatment/Interventions: Self-care/ADL training;Therapeutic exercise;Energy conservation;DME and/or AE instruction;Therapeutic activities;Patient/family education;Balance training    OT Goals(Current goals can be found in the care plan section) Acute Rehab OT Goals Patient Stated Goal: to be able to move the yard again OT Goal Formulation: With patient Time For Goal Achievement: 02/10/21 Potential to Achieve Goals: Good  OT Frequency: Min 2X/week   Barriers to D/C:            Co-evaluation              AM-PAC OT "6 Clicks" Daily Activity     Outcome Measure Help from another person eating meals?: None Help from another person taking care of personal grooming?: None Help from another person toileting, which includes using toliet, bedpan, or urinal?: A Little Help from another person bathing (including washing, rinsing, drying)?: A Little Help from another person to put on and taking off regular upper body clothing?: None Help from another person to put on and taking off regular lower body clothing?: A Little 6 Click Score: 21   End of Session Equipment Utilized During Treatment: Oxygen Nurse Communication: Mobility  status;Precautions  Activity Tolerance: Patient tolerated treatment well Patient left: in chair;with call bell/phone within reach;with chair alarm set  OT Visit Diagnosis: Unsteadiness on feet (R26.81);Muscle weakness (generalized) (M62.81)                Time: 4166-0630 OT Time Calculation (min): 29 min Charges:  OT General Charges $OT Visit: 1 Visit OT Evaluation $OT Eval Moderate Complexity: 1 Mod OT Treatments $Self Care/Home Management : 8-22 mins   Brynn, OTR/L  Acute Rehabilitation Services Pager: [phone] Office: [phone] .   Jeri Modena 01/27/2021, 11:59 AM

## 2021-01-27 NOTE — Progress Notes (Signed)
ANTICOAGULATION CONSULT NOTE - Initial Consult  Pharmacy Consult for Coumadin Indication: H/O DVT  No Known Allergies  Patient Measurements: Height: 5\' 5"  (165.1 cm) Weight: 77.1 kg (170 lb) IBW/kg (Calculated) : 61.5  Vital Signs: Temp: 97.7 F (36.5 C) (07/01 1421) Temp Source: Oral (07/01 1421) BP: 120/78 (07/02 0000) Pulse Rate: 82 (07/02 0000)  Labs: Recent Labs    01/26/21 1426 01/26/21 1728  HGB 14.8  --   HCT 47.6  --   PLT 339  --   LABPROT 14.5  --   INR 1.1  --   CREATININE 1.93*  --   TROPONINIHS 59* 59*    Estimated Creatinine Clearance: 36.5 mL/min (A) (by C-G formula based on SCr of 1.93 mg/dL (H)).   Medical History: Past Medical History:  Diagnosis Date   Abnormal nuclear stress test 02/19/2018   Angina, class III ([place]) 02/19/2018   Anxiety    CHF (congestive heart failure) (HCC)    Chronic kidney disease    CKD STAGE IV   Coronary artery disease    DDD (degenerative disc disease), cervical 03/01/2013   Diabetes mellitus    type 2   Diverticular disease 01/12/14   mild   DVT (deep vein thrombosis) in pregnancy 09/08/2020   Dyslipidemia    Hypertension    ILD (interstitial lung disease) (HCC)    Lumbosacral radiculopathy at L4 02/18/2017   Myocardial infarction [facility]) 2012   Obesity    Snoring 10/12/2013   TIA (transient ischemic attack) 08/09/2014   Tubular adenoma of colon 01/12/2014   transverse colon   VF (ventricular fibrillation) ([place])    arrest   Vitamin D deficiency 02/18/2017    Medications:  No current facility-administered medications on file prior to encounter.   Current Outpatient Medications on File Prior to Encounter  Medication Sig Dispense Refill   clopidogrel (PLAVIX) 75 MG tablet Take 75 mg by mouth daily.     furosemide (LASIX) 40 MG tablet Take 40 mg by mouth daily.     insulin aspart (NOVOLOG) 100 UNIT/ML injection Substitute to any brand approved.Before each meal 3 times a day, 140-199 - 2 units, 200-250 - 4 units,  251-299 - 6 units,  300-349 - 8 units,  350 or above 10 units. Dispense syringes and needles as needed, Ok to switch to PEN if approved. DX DM2, Code E11.65 (Patient taking differently: Substitute to any brand approved.Before each meal 3 times a day, 140-199 - 2 units, 200-250 - 4 units, 251-299 - 6 units,  300-349 - 8 units,  350 or above 10 units. Dispense syringes and needles as needed, Ok to switch to PEN if approved. DX DM2, Code E11.65) 10 mL 0   insulin glargine (LANTUS) 100 UNIT/ML Solostar Pen Inject 60 Units into the skin daily before breakfast.     metoprolol succinate (TOPROL-XL) 50 MG 24 hr tablet Take 1 tablet (50 mg total) by mouth in the morning and at bedtime. Take with or immediately following a meal. 180 tablet 1   mycophenolate (CELLCEPT) 500 MG tablet Take 1,000 mg by mouth 2 (two) times daily.     nitroGLYCERIN (NITROSTAT) 0.4 MG SL tablet PLACE 1 TABLET UNDER THE TONGUE EVERY 5 MINUTES AS NEEDED FOR CHEST PAIN 25 tablet 0   OZEMPIC, 1 MG/DOSE, 4 MG/3ML SOPN Inject 1 mg into the skin once a week.     pantoprazole (PROTONIX) 40 MG tablet Take 40 mg by mouth daily.     predniSONE (DELTASONE) 5 MG  tablet 8 Pills PO for 3 days,6 Pills PO for 3 days,4 Pills PO for 3 days,2 Pills PO for 3 days,1 Pills PO for 3 days,1/2 Pill  PO for 3 days then STOP. (Patient taking differently: Take 5 mg by mouth daily with breakfast.) 65 tablet 0   ranolazine (RANEXA) 500 MG 12 hr tablet TAKE ONE TABLET BY MOUTH TWICE DAILY.PLEASE MAKE APPT WITH DR.HARDING FOR REFILLS.  (Patient taking differently: Take 500 mg by mouth daily.) 60 tablet 2   vitamin B-12 (CYANOCOBALAMIN) 1000 MCG tablet TAKE 1 TABLET (1,000 MCG TOTAL) BY MOUTH DAILY. 30 tablet 0   warfarin (COUMADIN) 5 MG tablet TAKE 1 TABLET (5 MG TOTAL) BY MOUTH DAILY AT 4 PM. (Patient taking differently: Take 2.5-5 mg by mouth See admin instructions. Take 1 tablet (5 mg) daily except on Sun Take 1/2 tablet (2.5 mg)) 7 tablet 0   Insulin Syringe-Needle  U-100 25G X 1" 1 ML MISC For 4 times a day insulin SQ, 1 month supply. Diagnosis E11.65 30 each 0   rosuvastatin (CRESTOR) 40 MG tablet Take 1 tablet (40 mg total) by mouth daily. 90 tablet 3     Assessment: 65 y.o. male admitted with SOB/CHF, h/o DVT, to continue Coumadin  Goal of Therapy:  INR 2-3 Monitor platelets by anticoagulation protocol: Yes   Plan:  Coumadin 7.5 mg now Daily INR  Santina Trillo, [place] 01/27/2021,1:01 AM

## 2021-01-27 NOTE — Plan of Care (Signed)
  Problem: Clinical Measurements: Goal: Respiratory complications will improve Outcome: Progressing   Problem: Coping: Goal: Level of anxiety will decrease Outcome: Progressing   Problem: Safety: Goal: Ability to remain free from injury will improve Outcome: Progressing   

## 2021-01-27 NOTE — ED Notes (Signed)
Report called to RN receiving pt. 

## 2021-01-27 NOTE — Plan of Care (Signed)
  Problem: Clinical Measurements: Goal: Respiratory complications will improve Outcome: Progressing   

## 2021-01-27 NOTE — Progress Notes (Signed)
PROGRESS NOTE    Eugene [name]  KGU:[id number] DOB: 07-03-1956 DOA: 01/26/2021 PCP: [name], MD   Chief Complain: Shortness of breath  Brief Narrative: Patient is a 65 year old male with history of CVA, coronary artery disease status post CABG, systolic CHF, CKD stage III, degenerative disc disease, cystic fibrosis, diabetes type 2, GERD, hyperlipidemia, peripheral artery disease, hypertension ,prolonged QT, V. fib status post AICD placement, interstitial lung disease, sleep apnea, anxiety who presented with shortness of breath, cough, increased bilateral lower extremity edema.  Patient also reported that his AICD had fired twice recently.  He follows with Dr. Lovena Le, [place].  On presentation he was hypoxic on room air requiring supplemental oxygen, he does not use oxygen at home.  CBC showed leukocytosis, he had elevated BNP at 419.  Chest x-ray showed diffuse reticular pattern consistent with ILD, cardiomegaly.  Patient was admitted for the management of acute exacerbation of CHF, started on IV Lasix.  Cardiology also consulted.  Assessment & Plan:   Principal Problem:   Acute exacerbation of CHF (congestive heart failure) (HCC) Active Problems:   Hyperlipidemia   HYPERTENSION, BENIGN   CAD, NATIVE VESSEL   VENTRICULAR FIBRILLATION   Single implantable cardioverter-defibrillator (ICD) in situ   PAD (peripheral artery disease) ([place])   Coronary artery disease involving coronary bypass graft of native heart with angina pectoris (HCC)   History of CVA (cerebrovascular accident)   Gastroesophageal reflux disease without esophagitis   Chronic kidney disease, stage 3b (HCC)   ILD (interstitial lung disease) ([place])  Acute on chronic systolic congestive heart failure: Presented with cough, shortness of breath, bilateral lower extremity swelling.  Hypoxic on presentation.  Takes Lasix 60 mg once a day at home. Patient was found to be volume overloaded on presentation.  Elevated BNP.  Started on  IV Lasix.  Continue to monitor input and output, daily weight. Last echo done on 08/2020 showed ejection fraction of 45 to 50%.  We will request a new echocardiogram. Cardiology also consulted.  History of V. fib status post AICD: He reports that his AICD fired twice, last firing was last Friday(6/24), he had called the office of Dr. Lovena Le and he was advised just to monitor.  I have requested cardiology evaluation here.  He might need AICD interrogation.  Acute hypoxic respiratory failure: Most likely secondary to combination of CHF exacerbation, interstitial lung disease.  He does not use oxygen at home.  Currently requiring 3 to 4 L of oxygen per minute.  We will try to wean the oxygen.Chest x-ray showed diffuse  reticular pattern consistent with ILD, cardiomegaly.   History of coronary artery disease: Status post CABG.  Denies chest pain.  Minimally elevated flat trended troponin.  Continue home metoprolol, Plavix, aspirin  History of CVA/peripheral artery disease, hyperlipidemia: On Crestor and Plavix at home.  History of ILD/cystic fibrosis: Follows with rheumatology.  Takes prednisone, mycophenolate.  Leukocytosis: Chronic, most likely associated with prednisone therapy.  Continue to monitor.  Diabetes type 2: Monitor blood sugars.  Continue Lantus and sliding scale insulin.  CKD stage IIIb: Currently kidney function at baseline.  Continue to monitor while on diuresis.  History of DVT: On warfarin  Debility/deconditioning: PT/OT consulted  Hypokalemia: Supplemented with potassium               DVT prophylaxis:Warfarin Code Status: Full Family Communication: None at the bedside Status is: Observation   Dispo: The patient is from: Home  Anticipated d/c is to: Home              Patient currently is not medically stable to d/c.   Difficult to place patient No      Consultants: cardiology  Procedures:None  Antimicrobials:  Anti-infectives (From  admission, onward)    None       Subjective:  Patient seen and examined at the bedside this morning.  Hemodynamically stable.  Sitting in the chair.  He complains of persistent dyspnea but does not appear to be in respiratory distress and is speaking in full sentences.  He also complains of cough.  On 3 to 4 L of oxygen per minute.   Objective: Vitals:   01/27/21 0224 01/27/21 0224 01/27/21 0350 01/27/21 0752  BP: 106/74 132/75 106/74 102/74  Pulse: 79 98 79 72  Resp: 19 20 19 18   Temp: 98.2 F (36.8 C) 97.7 F (36.5 C) 98.2 F (36.8 C) 98.3 F (36.8 C)  TempSrc:  Oral Oral Oral  SpO2:  93% 98% 100%  Weight:  78.7 kg    Height:  5\' 5"  (1.651 m)      Intake/Output Summary (Last 24 hours) at 01/27/2021 0939 Last data filed at 01/27/2021 4315 Gross per 24 hour  Intake 0 ml  Output 600 ml  Net -600 ml   Filed Weights   01/26/21 1418 01/27/21 0224  Weight: 77.1 kg 78.7 kg    Examination:  General exam: Not in distress,obese HEENT:PERRL,Oral mucosa moist, Ear/Nose normal on gross exam Respiratory system: Bilateral coarse crackles  cardiovascular system: S1 & S2 heard, RRR. No JVD, murmurs, rubs, gallops or clicks.  Gastrointestinal system: Abdomen is mildly distended, soft and nontender. No organomegaly or masses felt. Normal bowel sounds heard. Central nervous system: Alert and oriented. No focal neurological deficits. Extremities: 1+ bilateral lower extremity pitting edema, no clubbing ,no cyanosis Skin: No rashes, lesions or ulcers,no icterus ,no pallor.     Data Reviewed: I have personally reviewed following labs and imaging studies  CBC: Recent Labs  Lab 01/26/21 1426 01/27/21 0352  WBC 18.3* 14.9*  HGB 14.8 14.1  HCT 47.6 43.4  MCV 92.6 89.1  PLT 339 400   Basic Metabolic Panel: Recent Labs  Lab 01/26/21 1426 01/27/21 0352  NA 137 137  K 4.6 3.4*  CL 101 102  CO2 25 24  GLUCOSE 187* 176*  BUN 28* 25*  CREATININE 1.93* 1.68*  CALCIUM 9.2 8.9   MG  --  2.2   GFR: Estimated Creatinine Clearance: 42.4 mL/min (A) (by C-G formula based on SCr of 1.68 mg/dL (H)). Liver Function Tests: No results for input(s): AST, ALT, ALKPHOS, BILITOT, PROT, ALBUMIN in the last 168 hours. No results for input(s): LIPASE, AMYLASE in the last 168 hours. No results for input(s): AMMONIA in the last 168 hours. Coagulation Profile: Recent Labs  Lab 01/26/21 1426 01/27/21 0352  INR 1.1 1.2   Cardiac Enzymes: No results for input(s): CKTOTAL, CKMB, CKMBINDEX, TROPONINI in the last 168 hours. BNP (last 3 results) Recent Labs    07/26/20 1033 08/04/20 1127  PROBNP 2,139* 2,329*   HbA1C: No results for input(s): HGBA1C in the last 72 hours. CBG: Recent Labs  Lab 01/27/21 0138 01/27/21 0632  GLUCAP 188* 177*   Lipid Profile: No results for input(s): CHOL, HDL, LDLCALC, TRIG, CHOLHDL, LDLDIRECT in the last 72 hours. Thyroid Function Tests: No results for input(s): TSH, T4TOTAL, FREET4, T3FREE, THYROIDAB in the last 72 hours. Anemia Panel: No results for  input(s): VITAMINB12, FOLATE, FERRITIN, TIBC, IRON, RETICCTPCT in the last 72 hours. Sepsis Labs: No results for input(s): PROCALCITON, LATICACIDVEN in the last 168 hours.  Recent Results (from the past 240 hour(s))  Resp Panel by RT-PCR (Flu A&B, Covid) Nasopharyngeal Swab     Status: None   Collection Time: 01/26/21 11:14 PM   Specimen: Nasopharyngeal Swab; Nasopharyngeal(NP) swabs in vial transport medium  Result Value Ref Range Status   SARS Coronavirus 2 by RT PCR NEGATIVE NEGATIVE Final    Comment: (NOTE) SARS-CoV-2 target nucleic acids are NOT DETECTED.  The SARS-CoV-2 RNA is generally detectable in upper respiratory specimens during the acute phase of infection. The lowest concentration of SARS-CoV-2 viral copies this assay can detect is 138 copies/mL. A negative result does not preclude SARS-Cov-2 infection and should not be used as the sole basis for treatment or other  patient management decisions. A negative result may occur with  improper specimen collection/handling, submission of specimen other than nasopharyngeal swab, presence of viral mutation(s) within the areas targeted by this assay, and inadequate number of viral copies(<138 copies/mL). A negative result must be combined with clinical observations, patient history, and epidemiological information. The expected result is Negative.  Fact Sheet for Patients:  EntrepreneurPulse.com.au  Fact Sheet for Healthcare Providers:  IncredibleEmployment.be  This test is no t yet approved or cleared by the [place] FDA and  has been authorized for detection and/or diagnosis of SARS-CoV-2 by FDA under an Emergency Use Authorization (EUA). This EUA will remain  in effect (meaning this test can be used) for the duration of the COVID-19 declaration under Section 564(b)(1) of the Act, 21 U.S.C.section 360bbb-3(b)(1), unless the authorization is terminated  or revoked sooner.       Influenza A by PCR NEGATIVE NEGATIVE Final   Influenza B by PCR NEGATIVE NEGATIVE Final    Comment: (NOTE) The Xpert Xpress SARS-CoV-2/FLU/RSV plus assay is intended as an aid in the diagnosis of influenza from Nasopharyngeal swab specimens and should not be used as a sole basis for treatment. Nasal washings and aspirates are unacceptable for Xpert Xpress SARS-CoV-2/FLU/RSV testing.  Fact Sheet for Patients: EntrepreneurPulse.com.au  Fact Sheet for Healthcare Providers: IncredibleEmployment.be  This test is not yet approved or cleared by the [place] FDA and has been authorized for detection and/or diagnosis of SARS-CoV-2 by FDA under an Emergency Use Authorization (EUA). This EUA will remain in effect (meaning this test can be used) for the duration of the COVID-19 declaration under Section 564(b)(1) of the Act, 21 U.S.C. section  360bbb-3(b)(1), unless the authorization is terminated or revoked.  Performed at [facility], [place] [address] [place], [place] [address]          Radiology Studies: DG Chest 2 View  Result Date: 01/26/2021 CLINICAL DATA:  Shortness of breath and cough EXAM: CHEST - 2 VIEW COMPARISON:  09/08/2020, CT 09/09/2020 FINDINGS: Post sternotomy changes. Left-sided pacing device as before. Recording device over the left chest. Diffuse bilateral reticular opacity consistent with chronic interstitial lung disease. No acute superimposed airspace disease. Mild cardiomegaly. No pleural effusion or pneumothorax. IMPRESSION: 1. Diffuse bilateral reticular opacity consistent with chronic interstitial lung disease as demonstrated on prior CT. No acute confluent airspace disease. 2. Mild cardiomegaly Electronically Signed   By: Donavan Foil M.D.   On: 01/26/2021 15:19        Scheduled Meds:  clopidogrel  75 mg Oral Daily   furosemide  40 mg Intravenous BID   insulin aspart  0-15 Units Subcutaneous TID WC   insulin aspart  0-5 Units Subcutaneous QHS   insulin glargine  30 Units Subcutaneous Daily   [START ON 01/28/2021] metoprolol succinate  50 mg Oral BID   mycophenolate  1,000 mg Oral BID   pantoprazole  40 mg Oral Daily   predniSONE  5 mg Oral Q breakfast   ranolazine  500 mg Oral Daily   rosuvastatin  40 mg Oral Daily   sodium chloride flush  3 mL Intravenous Q12H   Warfarin - Pharmacist Dosing Inpatient   Does not apply q1600   Continuous Infusions:   LOS: 0 days    Time spent: 35 mins,More than 50% of that time was spent in counseling and/or coordination of care.      Shelly Coss, MD [facility] P7/08/2020, 9:39 AM

## 2021-01-27 NOTE — Progress Notes (Signed)
Echocardiogram 2D Echocardiogram has been performed.  Oneal Deputy Yoshika Vensel RDCS 01/27/2021, 2:13 PM

## 2021-01-28 LAB — BASIC METABOLIC PANEL
Anion gap: 9 (ref 5–15)
Anion gap: 11 (ref 5–15)
BUN: 27 mg/dL — ABNORMAL HIGH (ref 8–23)
BUN: 28 mg/dL — ABNORMAL HIGH (ref 8–23)
CO2: 28 mmol/L (ref 22–32)
CO2: 24 mmol/L (ref 22–32)
Calcium: 9 mg/dL (ref 8.9–10.3)
Calcium: 8.6 mg/dL — ABNORMAL LOW (ref 8.9–10.3)
Chloride: 98 mmol/L (ref 98–111)
Chloride: 98 mmol/L (ref 98–111)
Creatinine, Ser: 1.81 mg/dL — ABNORMAL HIGH (ref 0.61–1.24)
Creatinine, Ser: 1.7 mg/dL — ABNORMAL HIGH (ref 0.61–1.24)
GFR, Estimated: 41 mL/min — ABNORMAL LOW (ref >60)
GFR, Estimated: 44 mL/min — ABNORMAL LOW (ref >60)
Glucose, Bld: 127 mg/dL — ABNORMAL HIGH (ref 70–99)
Glucose, Bld: 139 mg/dL — ABNORMAL HIGH (ref 70–99)
Potassium: 4.4 mmol/L (ref 3.5–5.1)
Potassium: 4.3 mmol/L (ref 3.5–5.1)
Sodium: 135 mmol/L (ref 135–145)
Sodium: 133 mmol/L — ABNORMAL LOW (ref 135–145)

## 2021-01-28 LAB — CBC WITH DIFFERENTIAL/PLATELET
Abs Immature Granulocytes: 0.22 10*3/uL — ABNORMAL HIGH (ref 0.00–0.07)
Basophils Absolute: 0 10*3/uL (ref 0.0–0.1)
Basophils Relative: 0 %
Eosinophils Absolute: 0.4 10*3/uL (ref 0.0–0.5)
Eosinophils Relative: 3 %
HCT: 41.6 % (ref 39.0–52.0)
Hemoglobin: 13.6 g/dL (ref 13.0–17.0)
Immature Granulocytes: 2 %
Lymphocytes Relative: 17 %
Lymphs Abs: 2.2 10*3/uL (ref 0.7–4.0)
MCH: 29 pg (ref 26.0–34.0)
MCHC: 32.7 g/dL (ref 30.0–36.0)
MCV: 88.7 fL (ref 80.0–100.0)
Monocytes Absolute: 1 10*3/uL (ref 0.1–1.0)
Monocytes Relative: 8 %
Neutro Abs: 8.9 10*3/uL — ABNORMAL HIGH (ref 1.7–7.7)
Neutrophils Relative %: 70 %
Platelets: 277 10*3/uL (ref 150–400)
RBC: 4.69 MIL/uL (ref 4.22–5.81)
RDW: 14.6 % (ref 11.5–15.5)
WBC: 12.7 10*3/uL — ABNORMAL HIGH (ref 4.0–10.5)
nRBC: 0 % (ref 0.0–0.2)

## 2021-01-28 LAB — GLUCOSE, CAPILLARY
Glucose-Capillary: 102 mg/dL — ABNORMAL HIGH (ref 70–99)
Glucose-Capillary: 125 mg/dL — ABNORMAL HIGH (ref 70–99)
Glucose-Capillary: 177 mg/dL — ABNORMAL HIGH (ref 70–99)
Glucose-Capillary: 165 mg/dL — ABNORMAL HIGH (ref 70–99)

## 2021-01-28 LAB — HEPARIN LEVEL (UNFRACTIONATED)
Heparin Unfractionated: 0.3 IU/mL (ref 0.30–0.70)
Heparin Unfractionated: 0.27 IU/mL — ABNORMAL LOW (ref 0.30–0.70)
Heparin Unfractionated: 0.69 IU/mL (ref 0.30–0.70)

## 2021-01-28 LAB — PROTIME-INR
INR: 1.4 — ABNORMAL HIGH (ref 0.8–1.2)
Prothrombin Time: 16.7 seconds — ABNORMAL HIGH (ref 11.4–15.2)

## 2021-01-28 MED ORDER — GUAIFENESIN-DM 100-10 MG/5ML PO SYRP
10.0000 mL | ORAL_SOLUTION | Freq: Four times a day (QID) | ORAL | Status: DC
  Administered 2021-01-28 – 2021-01-31 (×11): 10 mL via ORAL
  Filled 2021-01-28 (×11): qty 10

## 2021-01-28 MED ORDER — WARFARIN SODIUM 5 MG PO TABS
5.0000 mg | ORAL_TABLET | Freq: Once | ORAL | Status: AC
  Administered 2021-01-28: 5 mg via ORAL
  Filled 2021-01-28: qty 1

## 2021-01-28 NOTE — Progress Notes (Signed)
ANTICOAGULATION CONSULT NOTE - Follow Up Consult  Pharmacy Consult for Coumadin and Heparin bridge while INR subtherapeutic Indication: H/O DVT  No Known Allergies  Patient Measurements: Height: 5\' 5"  (165.1 cm) Weight: 77 kg (169 lb 12.8 oz) (scale c) IBW/kg (Calculated) : 61.5  Vital Signs: Temp: 98.2 F (36.8 C) (07/03 0700) Temp Source: Oral (07/03 0700) BP: 111/81 (07/03 0700) Pulse Rate: 77 (07/03 0700)  Labs: Recent Labs    01/26/21 1426 01/26/21 1728 01/27/21 0352 01/27/21 1919 01/28/21 0120  HGB 14.8  --  14.1  --  13.6  HCT 47.6  --  43.4  --  41.6  PLT 339  --  292  --  277  LABPROT 14.5  --  15.1  --  16.7*  INR 1.1  --  1.2  --  1.4*  HEPARINUNFRC  --   --   --   --  0.30  CREATININE 1.93*  --  1.68* 1.81* 1.70*  TROPONINIHS 59* 59*  --   --   --      Estimated Creatinine Clearance: 41.5 mL/min (A) (by C-G formula based on SCr of 1.7 mg/dL (H)).   Medical History: Past Medical History:  Diagnosis Date   Abnormal nuclear stress test 02/19/2018   Angina, class III ([place]) 02/19/2018   Anxiety    Chronic combined systolic and diastolic CHF (congestive heart failure) (HCC)    Chronic kidney disease, stage 3b (HCC)    Coronary artery disease    DDD (degenerative disc disease), cervical 03/01/2013   Diabetes mellitus    type 2   Diverticular disease 01/12/2014   mild   DVT (deep venous thrombosis) (HCC)    Dyslipidemia    Hypertension    ICD (implantable cardioverter-defibrillator) in place    ILD (interstitial lung disease) ([place])    Lumbosacral radiculopathy at L4 02/18/2017   Myocardial infarction ([place]) 2012   Obesity    Snoring 10/12/2013   TIA (transient ischemic attack) 08/09/2014   Tubular adenoma of colon 01/12/2014   transverse colon   VF (ventricular fibrillation) ([place])    arrest   Vitamin D deficiency 02/18/2017    Medications:  No current facility-administered medications on file prior to encounter.   Current Outpatient  Medications on File Prior to Encounter  Medication Sig Dispense Refill   clopidogrel (PLAVIX) 75 MG tablet Take 75 mg by mouth daily.     furosemide (LASIX) 40 MG tablet Take 40 mg by mouth daily.     insulin aspart (NOVOLOG) 100 UNIT/ML injection Substitute to any brand approved.Before each meal 3 times a day, 140-199 - 2 units, 200-250 - 4 units, 251-299 - 6 units,  300-349 - 8 units,  350 or above 10 units. Dispense syringes and needles as needed, Ok to switch to PEN if approved. DX DM2, Code E11.65 (Patient taking differently: Substitute to any brand approved.Before each meal 3 times a day, 140-199 - 2 units, 200-250 - 4 units, 251-299 - 6 units,  300-349 - 8 units,  350 or above 10 units. Dispense syringes and needles as needed, Ok to switch to PEN if approved. DX DM2, Code E11.65) 10 mL 0   insulin glargine (LANTUS) 100 UNIT/ML Solostar Pen Inject 60 Units into the skin daily before breakfast.     metoprolol succinate (TOPROL-XL) 50 MG 24 hr tablet Take 1 tablet (50 mg total) by mouth in the morning and at bedtime. Take with or immediately following a meal. 180 tablet 1   mycophenolate (  CELLCEPT) 500 MG tablet Take 1,000 mg by mouth 2 (two) times daily.     nitroGLYCERIN (NITROSTAT) 0.4 MG SL tablet PLACE 1 TABLET UNDER THE TONGUE EVERY 5 MINUTES AS NEEDED FOR CHEST PAIN 25 tablet 0   OZEMPIC, 1 MG/DOSE, 4 MG/3ML SOPN Inject 1 mg into the skin once a week.     pantoprazole (PROTONIX) 40 MG tablet Take 40 mg by mouth daily.     predniSONE (DELTASONE) 5 MG tablet 8 Pills PO for 3 days,6 Pills PO for 3 days,4 Pills PO for 3 days,2 Pills PO for 3 days,1 Pills PO for 3 days,1/2 Pill  PO for 3 days then STOP. (Patient taking differently: Take 5 mg by mouth daily with breakfast.) 65 tablet 0   ranolazine (RANEXA) 500 MG 12 hr tablet TAKE ONE TABLET BY MOUTH TWICE DAILY.PLEASE MAKE APPT WITH DR.HARDING FOR REFILLS.  (Patient taking differently: Take 500 mg by mouth daily.) 60 tablet 2   vitamin B-12  (CYANOCOBALAMIN) 1000 MCG tablet TAKE 1 TABLET (1,000 MCG TOTAL) BY MOUTH DAILY. 30 tablet 0   warfarin (COUMADIN) 5 MG tablet TAKE 1 TABLET (5 MG TOTAL) BY MOUTH DAILY AT 4 PM. (Patient taking differently: Take 2.5-5 mg by mouth See admin instructions. Take 1 tablet (5 mg) daily except on Sun Take 1/2 tablet (2.5 mg)) 7 tablet 0   Insulin Syringe-Needle U-100 25G X 1" 1 ML MISC For 4 times a day insulin SQ, 1 month supply. Diagnosis E11.65 30 each 0   rosuvastatin (CRESTOR) 40 MG tablet Take 1 tablet (40 mg total) by mouth daily. 90 tablet 3     Assessment: 65 y.o. male admitted with SOB/CHF. Patient is on warfarin prior to admission for DVT in February 2022. Pharmacy consulted to dose warfarin. Patient reports missing 2-3 doses last week but does not routinely miss doses. Given high CHADS-VASC and questionable compliance, pharmacy consulted to bridge with heparin until INR therapeutic. INR 1.4  Prior to admission warfarin regimen: 5mg  daily except 2.5mg  on Sundays   Heparin level 0.27 is subtherapeutic on heparin 1050 units/hr. RN reported IV access issues with AC PIV overnight and new IV placed in forearm for heparin (placed at 0130 per CHL). No issues with current IV access. RN reported AC IV site took significant amount of time to stop bleeding. No other signs of bleeding noted. Hgb 13.6. Plt wnl.   Goal of Therapy:  INR 2-3 Monitor platelets by anticoagulation protocol: Yes   Plan:  Increase heparin to 1150 units/hr Check 6 hr heparin level  Monitor heparin level, CBC and s/s of bleeding daily  Warfarin 5mg  x1  Monitor INR daily   Cristela Felt, PharmD Clinical Pharmacist  01/28/2021 8:57 AM

## 2021-01-28 NOTE — Plan of Care (Signed)
  Problem: Clinical Measurements: Goal: Diagnostic test results will improve Outcome: Progressing   

## 2021-01-28 NOTE — Progress Notes (Signed)
Progress Note  Patient Name: Eugene [name] Date of Encounter: 01/28/2021  [facility] Cardiologist: Werner Lean, MD   Subjective   Feels better. Breathing improved. Cr improving 1.70 from 1.8 yesterday Net negative 1.2L overnight on lasix 40mg  IV BID  Inpatient Medications    Scheduled Meds:  clopidogrel  75 mg Oral Daily   furosemide  40 mg Intravenous BID   guaiFENesin  600 mg Oral BID   insulin aspart  0-15 Units Subcutaneous TID WC   insulin aspart  0-5 Units Subcutaneous QHS   insulin glargine  30 Units Subcutaneous Daily   metoprolol succinate  50 mg Oral BID   mycophenolate  1,000 mg Oral BID   pantoprazole  40 mg Oral Daily   polyethylene glycol  17 g Oral Daily   predniSONE  5 mg Oral Q breakfast   ranolazine  500 mg Oral Daily   rosuvastatin  40 mg Oral Daily   sodium chloride flush  3 mL Intravenous Q12H   Warfarin - Pharmacist Dosing Inpatient   Does not apply q1600   Continuous Infusions:  heparin 1,050 Units/hr (01/27/21 1921)   PRN Meds: acetaminophen **OR** acetaminophen   Vital Signs    Vitals:   01/27/21 1823 01/27/21 2004 01/28/21 0020 01/28/21 0441  BP:  101/66 120/72 116/81  Pulse:  85 82 74  Resp:  18 18 18   Temp:  97.9 F (36.6 C) 98.4 F (36.9 C) 98.4 F (36.9 C)  TempSrc:  Oral Oral Oral  SpO2: 91% 96% 95% 95%  Weight:    77 kg  Height:        Intake/Output Summary (Last 24 hours) at 01/28/2021 0645 Last data filed at 01/28/2021 0300 Gross per 24 hour  Intake 468.3 ml  Output 1625 ml  Net -1156.7 ml   Last 3 Weights 01/28/2021 01/27/2021 01/26/2021  Weight (lbs) 169 lb 12.8 oz 173 lb 8 oz 170 lb  Weight (kg) 77.021 kg 78.699 kg 77.111 kg      Telemetry    Sinus rhythm/sinus tach - Personally Reviewed  ECG    No new tracing - Personally Reviewed  Physical Exam   GEN: No acute distress.   Neck: JVD difficult to assess due to body habitus Cardiac: RRR, no murmurs, rubs, or gallops.  Respiratory: Crackles at  left lung base; otherwise clear GI: Obese, soft, nontender, non-distended  MS: Trace ankle edema, warm Neuro:  Nonfocal  Psych: Normal affect   Labs    High Sensitivity Troponin:   Recent Labs  Lab 01/26/21 1426 01/26/21 1728  TROPONINIHS 59* 59*      Chemistry Recent Labs  Lab 01/27/21 0352 01/27/21 1919 01/28/21 0120  NA 137 135 133*  K 3.4* 4.4 4.3  CL 102 98 98  CO2 24 28 24   GLUCOSE 176* 127* 139*  BUN 25* 27* 28*  CREATININE 1.68* 1.81* 1.70*  CALCIUM 8.9 9.0 8.6*  GFRNONAA 45* 41* 44*  ANIONGAP 11 9 11      Hematology Recent Labs  Lab 01/26/21 1426 01/27/21 0352 01/28/21 0120  WBC 18.3* 14.9* 12.7*  RBC 5.14 4.87 4.69  HGB 14.8 14.1 13.6  HCT 47.6 43.4 41.6  MCV 92.6 89.1 88.7  MCH 28.8 29.0 29.0  MCHC 31.1 32.5 32.7  RDW 14.9 14.6 14.6  PLT 339 292 277    BNP Recent Labs  Lab 01/26/21 1431  BNP 419.9*     DDimer No results for input(s): DDIMER in the last 168 hours.  Radiology    DG Chest 2 View  Result Date: 01/26/2021 CLINICAL DATA:  Shortness of breath and cough EXAM: CHEST - 2 VIEW COMPARISON:  09/08/2020, CT 09/09/2020 FINDINGS: Post sternotomy changes. Left-sided pacing device as before. Recording device over the left chest. Diffuse bilateral reticular opacity consistent with chronic interstitial lung disease. No acute superimposed airspace disease. Mild cardiomegaly. No pleural effusion or pneumothorax. IMPRESSION: 1. Diffuse bilateral reticular opacity consistent with chronic interstitial lung disease as demonstrated on prior CT. No acute confluent airspace disease. 2. Mild cardiomegaly Electronically Signed   By: Donavan Foil M.D.   On: 01/26/2021 15:19   ECHOCARDIOGRAM COMPLETE  Result Date: 01/27/2021    ECHOCARDIOGRAM REPORT   Patient Name:   [name] [address] [facility] Date of Exam: 01/27/2021 Medical Rec #:  [id number]    Height:       65.0 in Accession #:    [id number]   Weight:       173.5 lb Date of Birth:  1955/08/27   BSA:          1.862 m  Patient Age:    65 years     BP:           96/64 mmHg Patient Gender: M            HR:           76 bpm. Exam Location:  Inpatient Procedure: 2D Echo, Color Doppler, Cardiac Doppler and Intracardiac            Opacification Agent Indications:    T41.96 Acute systolic (congestive) heart failure  History:        Patient has prior history of Echocardiogram examinations, most                 recent 09/14/2020. CHF, CAD, Defibrillator; Risk                 Factors:Hypertension, Diabetes, Dyslipidemia, Sleep Apnea and                 COVID+ 08/2020.  Sonographer:    [place] Referring Phys: [phone] AMRIT ADHIKARI IMPRESSIONS  1. Left ventricular ejection fraction, by estimation, is 45 to 50%. The left ventricle has mildly decreased function. The left ventricle demonstrates regional wall motion abnormalities.     There is hypokinesis of the basal-to-mid anteroseptal, basal-to-mid anterior, basal-to-mid inferior and basal inferoseptal LV walls. Left ventricular diastolic parameters are consistent with Grade I diastolic dysfunction (impaired relaxation).  2. Right ventricular systolic function is mildly reduced. The right ventricular size is not well visualized.  3. The mitral valve is grossly normal. Trivial mitral valve regurgitation. No evidence of mitral stenosis.  4. The aortic valve is tricuspid. There is mild calcification of the aortic valve. There is moderate thickening of the aortic valve. Aortic valve regurgitation is not visualized. Mild to moderate aortic valve sclerosis/calcification is present, without any evidence of aortic stenosis. Comparison(s): Compared to prior limited TTE in 09/14/20, there is no significant change. There continues to be WMA as described above. FINDINGS  Left Ventricle: Left ventricular ejection fraction, by estimation, is 45 to 50%. The left ventricle has mildly decreased function. The left ventricle demonstrates regional wall motion abnormalities. Definity contrast agent was  given IV to delineate the left ventricular endocardial borders. There is hypokinesis of the basal-to-mid anteroseptal, basal-to-mid anterior, basal-to-mid inferior and basal inferoseptal LV walls. The left ventricular internal cavity size was normal in size. There is no left ventricular hypertrophy. Left  ventricular diastolic parameters are consistent with Grade I diastolic dysfunction (impaired relaxation). Right Ventricle: The right ventricular size is not well visualized. Right vetricular wall thickness was not well visualized. Right ventricular systolic function is mildly reduced. Left Atrium: Left atrial size was normal in size. Right Atrium: Right atrial size was normal in size. Pericardium: There is no evidence of pericardial effusion. Mitral Valve: The mitral valve is grossly normal. There is mild thickening of the mitral valve leaflet(s). There is mild calcification of the mitral valve leaflet(s). Mild to moderate mitral annular calcification. Trivial mitral valve regurgitation. No evidence of mitral valve stenosis. Tricuspid Valve: The tricuspid valve is normal in structure. Tricuspid valve regurgitation is trivial. Aortic Valve: The aortic valve is tricuspid. There is mild calcification of the aortic valve. There is moderate thickening of the aortic valve. Aortic valve regurgitation is not visualized. Mild to moderate aortic valve sclerosis/calcification is present, without any evidence of aortic stenosis. Pulmonic Valve: The pulmonic valve was not well visualized. Pulmonic valve regurgitation is trivial. Aorta: The aortic root and ascending aorta are structurally normal, with no evidence of dilitation. IAS/Shunts: No atrial level shunt detected by color flow Doppler. Additional Comments: A device lead is visualized.  LEFT VENTRICLE PLAX 2D LVIDd:         4.20 cm  Diastology LVIDs:         3.50 cm  LV e' medial:    3.90 cm/s LV PW:         0.90 cm  LV E/e' medial:  18.9 LV IVS:        0.80 cm  LV e'  lateral:   4.95 cm/s LVOT diam:     2.00 cm  LV E/e' lateral: 14.9 LV SV:         39 LV SV Index:   21 LVOT Area:     3.14 cm  RIGHT VENTRICLE RV S prime:     5.78 cm/s TAPSE (M-mode): 1.7 cm LEFT ATRIUM             Index       RIGHT ATRIUM           Index LA diam:        3.50 cm 1.88 cm/m  RA Area:     19.30 cm LA Vol (A2C):   28.2 ml 15.14 ml/m RA Volume:   53.00 ml  28.46 ml/m LA Vol (A4C):   46.7 ml 25.08 ml/m LA Biplane Vol: 35.9 ml 19.28 ml/m  AORTIC VALVE LVOT Vmax:   67.90 cm/s LVOT Vmean:  47.800 cm/s LVOT VTI:    0.124 m  AORTA Ao Root diam: 3.40 cm Ao Asc diam:  3.00 cm MITRAL VALVE MV Area (PHT): 2.83 cm     SHUNTS MV Decel Time: 268 msec     Systemic VTI:  0.12 m MV E velocity: 73.90 cm/s   Systemic Diam: 2.00 cm MV A velocity: 102.00 cm/s MV E/A ratio:  0.72 Gwyndolyn Kaufman MD Electronically signed by Gwyndolyn Kaufman MD Signature Date/Time: 01/27/2021/3:32:43 PM    Final     Cardiac Studies   2D echo 09/14/20   1. Left ventricular ejection fraction, by estimation, is 45 to 50%. The  left ventricle has mildly decreased function. The left ventricle  demonstrates regional wall motion abnormalities (see scoring  diagram/findings for description).   2. Right ventricular systolic function is normal. The right ventricular  size is normal.   3. The mitral valve is normal in structure. Trivial  mitral valve  regurgitation. No evidence of mitral stenosis.   4. The aortic valve is grossly normal. There is mild calcification of the  aortic valve. There is mild thickening of the aortic valve. Aortic valve  regurgitation is not visualized. No aortic stenosis is present.   Comparison(s): Prior images reviewed side by side.   Conclusion(s)/Recommendation(s): Llimited study for EF. Patient is now in  atrial fibrillation. The EF is mildly reduced, with slight focal wall  motion abnormalities as noted. Prior images viewed side by side. The  pattern is very similar to prior, but  better  seen with definity contrast on current study. Findings discussed  with Dr. Margaretann Loveless.   Cath 08/2020 Prox LAD lesion is 99% stenosed. Ost LAD to Prox LAD lesion is 100% stenosed. Mid LAD lesion is 80% stenosed. 1st Mrg lesion is 99% stenosed. Ost Cx to Prox Cx lesion is 80% stenosed. Mid Cx lesion is 100% stenosed. Balloon angioplasty was performed. Prox RCA to Dist RCA lesion is 100% stenosed with 100% stenosed side branch in RPAV. Ost Ramus lesion is 100% stenosed. SVG and is normal in caliber. Previously placed Prox Graft drug eluting stent is widely patent. LIMA and is large. The graft exhibits no disease. SVG and is normal in caliber. Origin to Prox Graft lesion is 100% stenosed. SVG and is large. Prox Graft to Dist Graft lesion is 100% stenosed. Dist Cx lesion is 95% stenosed. Mid LM to Dist LM lesion is 80% stenosed. Post intervention, there is a 20% residual stenosis.   Severe native coronary obstructive disease 80% distal left main stenosis, total occlusion of the LAD, ramus immediate vessel, high-grade circumflex stenoses, total occlusion of the proximal RCA.  There was a shunt to the distal RCA via left to right and right to right collateralization.   Patent LIMA graft supplying the mid LAD with diffuse disease in the LAD after insertion.   Supplying the OM 2 vessel with widely patent stent in the proximal portion of the SVG.  There is 95% in-stent restenosis in the native OM 2 vessel beyond the anastomosis.   Old occlusion of the vein graft which had supplied the ramus vessel.   Old occlusion of the vein graft which had supplied the PDA.   Difficult but ultimately successful intervention through the SVG to the distal OM 2 vessel requiring PTCA, [name] Cutting Balloon, and ultimate noncompliant balloon dilatation with a 95% stenosis being reduced to less than 20%.   LVEDP: 11 mmHg   RECOMMENDATION: DAPT probably indefinitely.  Since the patient developed early  in-stent restenosis on aspirin/Plavix, Brilinta was utilized for more aggressive antiplatelet therapy.  Medical therapy for concomitant CAD.  Optimal blood pressure control, diabetes management, and aggressive lipid therapy with target LDL less than 70.  Patient Profile     65 y.o. male with a history of CAD s/p CABG and multiple subsequent PCIs, VF arrest s/p STJ ICD implanted 2012, chronic combined CHF, obesity, HTN, DM, reported TIA/CVA, CKD stage IIIb, PAF on warfarin, DVT, possible ILD, abnormal CK/rheum markers 08/2020, transaminitis, diverticular disease, dyslipidemia, anxiety, DDD, PAD who presented with shortness of breath and cough found to have BNP 419 consistent with acute on chronic combined heart failure exacerbation.  Assessment & Plan    #Acute on Chronic Combined Systolic and Diastolic HF: Patient presented with worsening SOB and cough found to have BNP 400 with LE edema on exam consistent with acute on chronic combined systolic HF exacerbation. TTE on this admission consistent with  prior with LVEF 40-45% with mulitple WMA as described above. While patient is overloaded, the degree of symptoms seem out-of-proportion to volume status. Possibly underlying ILD is contributing. Overall volume status improving with lasix. Would like to check CMR to rule out infiltrative process as well given underlying rheumatologic condition, multiple WMA and ICD shocks. -Check CMR  -Continue lasix 40mg  IV BID -Continue metop succinate 50mg  XL BID -Will add GDMT as tolerated  -Monitor I/Os and daily weights -Low Na diet  #Multivessel CAD s/p CABG with multiple subsequent PCIs: Patient with history of CAD s/p CABG with multiple subsequent PCIs. Most recent admission on 08/2020 with NSTEMI found to have ISR s/p PTCA.  Has had a lot of confusion about antiplatelet and AC therapy post-cath. Was recommended for ticagrelor and warfarin but ultimately the patient did not tolerate the ticagrelor due to SOB and  was switched back to plavix and warfarin per his PCP. Also was previously on repatha but this was stopped per patient preference and transitioned back to crestor. Overall, there appears to be a lot of confusion about his medication regimen and there is concern for noncompliance. Will need to have a very clear regimen going forward to help improve adherence. Fortunately, low suspicion for ACS as etiology of current presentation. Trop flat and no agnina.  -Plan for plavix 75mg  daily and warfarin going forward -Did not tolerate ticagrelor due to SOB and "panic attacks" -Continue crestor 40mg  daily with goal LDL<70; if not at goal in 6 weeks, can add zetia 10mg  daily -Continue metop 50mg  XL BID    #History of DVT (08/2020): On warfarin but INR 1.1. on admission and patient admits to missing doses. -Resume warfarin with heparin as bridge as detailed below  #Paroxysmal Afib: CHADs-vasc 7. In NSR currently. Given high CHADs score and subtherapeutic INR, will plan for heparin gtt until INR at goal of 2-3. -Continue heparin gtt until INR therapeutic -Continue warfarin -Continue metop 50mg  XL BID  #VT with ICD shock: #History of VF arrest s/p ICD in 2012: Patient with recent shock on 6/27 in the setting of worsening volume status. Also with shock on 4/18 for which he was seen by EP. Low suspicion for ischemia, but there is concern that possible infiltrative process may be contributing. Will pursue CMR (if able pending renal function) as above. -Management of HF as above -Check CMR as above -Continue metop 50mg  XL BID -Keep K>4, Mg>2  #ILD: #Positive double-stranded DNA: Management per Rheum and Pulm: -Continue pred and MMF -Consider pulm consult vs close follow-up as out-patient  #HTN: Stable and controlled. -Continue metop as above  #CKD stage IIIB: -Monitor with diuresis      For questions or updates, please contact [place] Please consult [email or web address] for contact info under         Signed, Freada Bergeron, MD  01/28/2021, 6:45 AM

## 2021-01-28 NOTE — Progress Notes (Signed)
PROGRESS NOTE    Eugene [name]  JSH:[id number] DOB: 16-Dec-1955 DOA: 01/26/2021 PCP: [name], MD   Chief Complain: Shortness of breath  Brief Narrative: Patient is a 65 year old male with history of CVA, coronary artery disease status post CABG, systolic CHF, CKD stage III, degenerative disc disease, cystic fibrosis, diabetes type 2, GERD, hyperlipidemia, peripheral artery disease, hypertension ,prolonged QT, V. fib status post AICD placement, interstitial lung disease, sleep apnea, anxiety who presented with shortness of breath, cough, increased bilateral lower extremity edema.  Patient also reported that his AICD had fired twice recently.  He follows with Dr. Lovena Le, [facility].  On presentation he was hypoxic on room air requiring supplemental oxygen, he does not use oxygen at home.  CBC showed leukocytosis, he had elevated BNP at 419.  Chest x-ray showed diffuse reticular pattern consistent with ILD, cardiomegaly.  Patient was admitted for the management of acute exacerbation of CHF, started on IV Lasix.  Cardiology also consulted.  Assessment & Plan:   Principal Problem:   Acute exacerbation of CHF (congestive heart failure) (HCC) Active Problems:   Hyperlipidemia   HYPERTENSION, BENIGN   CAD, NATIVE VESSEL   VENTRICULAR FIBRILLATION   Single implantable cardioverter-defibrillator (ICD) in situ   PAD (peripheral artery disease) ([place])   Coronary artery disease involving coronary bypass graft of native heart with angina pectoris (HCC)   History of CVA (cerebrovascular accident)   Gastroesophageal reflux disease without esophagitis   Chronic kidney disease, stage 3b (HCC)   ILD (interstitial lung disease) ([place])  Acute on chronic combined  systolic/diastolic congestive heart failure: Presented with cough, shortness of breath, bilateral lower extremity swelling.  Hypoxic on presentation.  Takes Lasix 60 mg once a day at home. Patient was found to be volume overloaded on presentation.   Elevated BNP.  Started on IV Lasix.  Continue to monitor input and output, daily weight. Echo done on this admission showed ejection fraction of 45 to 50%, regional wall motion abnormality in the left ventricle, grade 1 diastolic dysfunction cardiology also consulted and following.  Recommended cardiac MRI as per cardiology. We will continue diuresis  History of V. fib status post AICD: He reports that his AICD fired twice, last firing was last Friday(6/24), he had called the office of Dr. Lovena Le and he was advised just to monitor.  I have requested cardiology evaluation here and following  Acute hypoxic respiratory failure: Most likely secondary to combination of CHF exacerbation, interstitial lung disease.  He does not use oxygen at home.  Was requiring 3 to 4 L of oxygen per minute,now on 1 L.  He feels better symptomatically.  We will try to wean the oxygen.Chest x-ray showed diffuse  reticular pattern consistent with ILD, cardiomegaly.  I do not think he needs pulmonology consultation while inpatient but he definitely needs outpatient follow-up  History of coronary artery disease: Status post CABG.  Denies chest pain.  Minimally elevated flat trended troponin.  Continue home metoprolol, Plavix, aspirin  History of CVA/peripheral artery disease, hyperlipidemia: On Crestor and Plavix at home.  History of ILD/cystic fibrosis: Follows with rheumatology.  Takes prednisone, mycophenolate.  Leukocytosis: Chronic, most likely associated with prednisone therapy.  Continue to monitor.  Diabetes type 2: Monitor blood sugars.  Continue Lantus and sliding scale insulin.  CKD stage IIIb: Currently kidney function at baseline.  Continue to monitor while on diuresis.  History of DVT: On warfarin at home, reported that he missed few doses.  Currently on heparin drip and warfarin and  will continue until INR is therapeutic.  Paroxysmal A. fib: Continue anticoagulation.  On metoprolol.  Currently rate is  controlled.  And on sinus rhythm  Debility/deconditioning: PT/OT consulted  Hypokalemia: Supplemented with potassium               DVT prophylaxis:Warfarin/heparin Code Status: Full Family Communication: None at the bedside Status is: Inpatient   Dispo: The patient is from: Home              Anticipated d/c is to: Home              Patient currently is not medically stable to d/c.   Difficult to place patient No      Consultants: cardiology  Procedures:None  Antimicrobials:  Anti-infectives (From admission, onward)    None       Subjective:  Patient seen and examined the bedside this morning.  Hemodynamically stable.  He was on 1 L of oxygen.  He feels better today.  Denies any worsening shortness of breath or cough.  Objective: Vitals:   01/27/21 1823 01/27/21 2004 01/28/21 0020 01/28/21 0441  BP:  101/66 120/72 116/81  Pulse:  85 82 74  Resp:  18 18 18   Temp:  97.9 F (36.6 C) 98.4 F (36.9 C) 98.4 F (36.9 C)  TempSrc:  Oral Oral Oral  SpO2: 91% 96% 95% 95%  Weight:    77 kg  Height:        Intake/Output Summary (Last 24 hours) at 01/28/2021 0744 Last data filed at 01/28/2021 0300 Gross per 24 hour  Intake 468.3 ml  Output 1625 ml  Net -1156.7 ml   Filed Weights   01/26/21 1418 01/27/21 0224 01/28/21 0441  Weight: 77.1 kg 78.7 kg 77 kg    Examination: General exam: Overall comfortable, not in distress HEENT: PERRL Respiratory system:  no wheezes or crackles  Cardiovascular system: S1 & S2 heard, RRR.  Gastrointestinal system: Abdomen is nondistended, soft and nontender. Central nervous system: Alert and oriented Extremities: No edema, no clubbing ,no cyanosis Skin: No rashes, no ulcers,no icterus     Data Reviewed: I have personally reviewed following labs and imaging studies  CBC: Recent Labs  Lab 01/26/21 1426 01/27/21 0352 01/28/21 0120  WBC 18.3* 14.9* 12.7*  NEUTROABS  --   --  8.9*  HGB 14.8 14.1 13.6  HCT 47.6  43.4 41.6  MCV 92.6 89.1 88.7  PLT 339 292 226   Basic Metabolic Panel: Recent Labs  Lab 01/26/21 1426 01/27/21 0352 01/27/21 1919 01/28/21 0120  NA 137 137 135 133*  K 4.6 3.4* 4.4 4.3  CL 101 102 98 98  CO2 25 24 28 24   GLUCOSE 187* 176* 127* 139*  BUN 28* 25* 27* 28*  CREATININE 1.93* 1.68* 1.81* 1.70*  CALCIUM 9.2 8.9 9.0 8.6*  MG  --  2.2  --   --    GFR: Estimated Creatinine Clearance: 41.5 mL/min (A) (by C-G formula based on SCr of 1.7 mg/dL (H)). Liver Function Tests: No results for input(s): AST, ALT, ALKPHOS, BILITOT, PROT, ALBUMIN in the last 168 hours. No results for input(s): LIPASE, AMYLASE in the last 168 hours. No results for input(s): AMMONIA in the last 168 hours. Coagulation Profile: Recent Labs  Lab 01/26/21 1426 01/27/21 0352 01/28/21 0120  INR 1.1 1.2 1.4*   Cardiac Enzymes: No results for input(s): CKTOTAL, CKMB, CKMBINDEX, TROPONINI in the last 168 hours. BNP (last 3 results) Recent Labs    07/26/20 1033  08/04/20 1127  PROBNP 2,139* 2,329*   HbA1C: No results for input(s): HGBA1C in the last 72 hours. CBG: Recent Labs  Lab 01/27/21 0632 01/27/21 1153 01/27/21 1622 01/27/21 2140 01/28/21 0604  GLUCAP 177* 192* 119* 152* 102*   Lipid Profile: No results for input(s): CHOL, HDL, LDLCALC, TRIG, CHOLHDL, LDLDIRECT in the last 72 hours. Thyroid Function Tests: No results for input(s): TSH, T4TOTAL, FREET4, T3FREE, THYROIDAB in the last 72 hours. Anemia Panel: No results for input(s): VITAMINB12, FOLATE, FERRITIN, TIBC, IRON, RETICCTPCT in the last 72 hours. Sepsis Labs: No results for input(s): PROCALCITON, LATICACIDVEN in the last 168 hours.  Recent Results (from the past 240 hour(s))  Resp Panel by RT-PCR (Flu A&B, Covid) Nasopharyngeal Swab     Status: None   Collection Time: 01/26/21 11:14 PM   Specimen: Nasopharyngeal Swab; Nasopharyngeal(NP) swabs in vial transport medium  Result Value Ref Range Status   SARS Coronavirus 2  by RT PCR NEGATIVE NEGATIVE Final    Comment: (NOTE) SARS-CoV-2 target nucleic acids are NOT DETECTED.  The SARS-CoV-2 RNA is generally detectable in upper respiratory specimens during the acute phase of infection. The lowest concentration of SARS-CoV-2 viral copies this assay can detect is 138 copies/mL. A negative result does not preclude SARS-Cov-2 infection and should not be used as the sole basis for treatment or other patient management decisions. A negative result may occur with  improper specimen collection/handling, submission of specimen other than nasopharyngeal swab, presence of viral mutation(s) within the areas targeted by this assay, and inadequate number of viral copies(<138 copies/mL). A negative result must be combined with clinical observations, patient history, and epidemiological information. The expected result is Negative.  Fact Sheet for Patients:  EntrepreneurPulse.com.au  Fact Sheet for Healthcare Providers:  IncredibleEmployment.be  This test is no t yet approved or cleared by the [place] FDA and  has been authorized for detection and/or diagnosis of SARS-CoV-2 by FDA under an Emergency Use Authorization (EUA). This EUA will remain  in effect (meaning this test can be used) for the duration of the COVID-19 declaration under Section 564(b)(1) of the Act, 21 U.S.C.section 360bbb-3(b)(1), unless the authorization is terminated  or revoked sooner.       Influenza A by PCR NEGATIVE NEGATIVE Final   Influenza B by PCR NEGATIVE NEGATIVE Final    Comment: (NOTE) The Xpert Xpress SARS-CoV-2/FLU/RSV plus assay is intended as an aid in the diagnosis of influenza from Nasopharyngeal swab specimens and should not be used as a sole basis for treatment. Nasal washings and aspirates are unacceptable for Xpert Xpress SARS-CoV-2/FLU/RSV testing.  Fact Sheet for Patients: EntrepreneurPulse.com.au  Fact  Sheet for Healthcare Providers: IncredibleEmployment.be  This test is not yet approved or cleared by the [place] FDA and has been authorized for detection and/or diagnosis of SARS-CoV-2 by FDA under an Emergency Use Authorization (EUA). This EUA will remain in effect (meaning this test can be used) for the duration of the COVID-19 declaration under Section 564(b)(1) of the Act, 21 U.S.C. section 360bbb-3(b)(1), unless the authorization is terminated or revoked.  Performed at [facility], [place] [address], [place], [place] [address]          Radiology Studies: DG Chest 2 View  Result Date: 01/26/2021 CLINICAL DATA:  Shortness of breath and cough EXAM: CHEST - 2 VIEW COMPARISON:  09/08/2020, CT 09/09/2020 FINDINGS: Post sternotomy changes. Left-sided pacing device as before. Recording device over the left chest. Diffuse bilateral reticular opacity consistent with chronic interstitial  lung disease. No acute superimposed airspace disease. Mild cardiomegaly. No pleural effusion or pneumothorax. IMPRESSION: 1. Diffuse bilateral reticular opacity consistent with chronic interstitial lung disease as demonstrated on prior CT. No acute confluent airspace disease. 2. Mild cardiomegaly Electronically Signed   By: Donavan Foil M.D.   On: 01/26/2021 15:19   ECHOCARDIOGRAM COMPLETE  Result Date: 01/27/2021    ECHOCARDIOGRAM REPORT   Patient Name:   Eugene [name] [facility] Date of Exam: 01/27/2021 Medical Rec #:  [id number]    Height:       65.0 in Accession #:    [id number]   Weight:       173.5 lb Date of Birth:  Jan 21, 1956   BSA:          1.862 m Patient Age:    89 years     BP:           96/64 mmHg Patient Gender: M            HR:           76 bpm. Exam Location:  Inpatient Procedure: 2D Echo, Color Doppler, Cardiac Doppler and Intracardiac            Opacification Agent Indications:    E17.40 Acute systolic (congestive) heart failure  History:        Patient has prior history of  Echocardiogram examinations, most                 recent 09/14/2020. CHF, CAD, Defibrillator; Risk                 Factors:Hypertension, Diabetes, Dyslipidemia, Sleep Apnea and                 COVID+ 08/2020.  Sonographer:    Ava Referring Phys: [phone] Tayjon Halladay IMPRESSIONS  1. Left ventricular ejection fraction, by estimation, is 45 to 50%. The left ventricle has mildly decreased function. The left ventricle demonstrates regional wall motion abnormalities.     There is hypokinesis of the basal-to-mid anteroseptal, basal-to-mid anterior, basal-to-mid inferior and basal inferoseptal LV walls. Left ventricular diastolic parameters are consistent with Grade I diastolic dysfunction (impaired relaxation).  2. Right ventricular systolic function is mildly reduced. The right ventricular size is not well visualized.  3. The mitral valve is grossly normal. Trivial mitral valve regurgitation. No evidence of mitral stenosis.  4. The aortic valve is tricuspid. There is mild calcification of the aortic valve. There is moderate thickening of the aortic valve. Aortic valve regurgitation is not visualized. Mild to moderate aortic valve sclerosis/calcification is present, without any evidence of aortic stenosis. Comparison(s): Compared to prior limited TTE in 09/14/20, there is no significant change. There continues to be WMA as described above. FINDINGS  Left Ventricle: Left ventricular ejection fraction, by estimation, is 45 to 50%. The left ventricle has mildly decreased function. The left ventricle demonstrates regional wall motion abnormalities. Definity contrast agent was given IV to delineate the left ventricular endocardial borders. There is hypokinesis of the basal-to-mid anteroseptal, basal-to-mid anterior, basal-to-mid inferior and basal inferoseptal LV walls. The left ventricular internal cavity size was normal in size. There is no left ventricular hypertrophy. Left ventricular diastolic parameters are  consistent with Grade I diastolic dysfunction (impaired relaxation). Right Ventricle: The right ventricular size is not well visualized. Right vetricular wall thickness was not well visualized. Right ventricular systolic function is mildly reduced. Left Atrium: Left atrial size was normal in size. Right Atrium: Right atrial size was normal  in size. Pericardium: There is no evidence of pericardial effusion. Mitral Valve: The mitral valve is grossly normal. There is mild thickening of the mitral valve leaflet(s). There is mild calcification of the mitral valve leaflet(s). Mild to moderate mitral annular calcification. Trivial mitral valve regurgitation. No evidence of mitral valve stenosis. Tricuspid Valve: The tricuspid valve is normal in structure. Tricuspid valve regurgitation is trivial. Aortic Valve: The aortic valve is tricuspid. There is mild calcification of the aortic valve. There is moderate thickening of the aortic valve. Aortic valve regurgitation is not visualized. Mild to moderate aortic valve sclerosis/calcification is present, without any evidence of aortic stenosis. Pulmonic Valve: The pulmonic valve was not well visualized. Pulmonic valve regurgitation is trivial. Aorta: The aortic root and ascending aorta are structurally normal, with no evidence of dilitation. IAS/Shunts: No atrial level shunt detected by color flow Doppler. Additional Comments: A device lead is visualized.  LEFT VENTRICLE PLAX 2D LVIDd:         4.20 cm  Diastology LVIDs:         3.50 cm  LV e' medial:    3.90 cm/s LV PW:         0.90 cm  LV E/e' medial:  18.9 LV IVS:        0.80 cm  LV e' lateral:   4.95 cm/s LVOT diam:     2.00 cm  LV E/e' lateral: 14.9 LV SV:         39 LV SV Index:   21 LVOT Area:     3.14 cm  RIGHT VENTRICLE RV S prime:     5.78 cm/s TAPSE (M-mode): 1.7 cm LEFT ATRIUM             Index       RIGHT ATRIUM           Index LA diam:        3.50 cm 1.88 cm/m  RA Area:     19.30 cm LA Vol (A2C):   28.2 ml 15.14  ml/m RA Volume:   53.00 ml  28.46 ml/m LA Vol (A4C):   46.7 ml 25.08 ml/m LA Biplane Vol: 35.9 ml 19.28 ml/m  AORTIC VALVE LVOT Vmax:   67.90 cm/s LVOT Vmean:  47.800 cm/s LVOT VTI:    0.124 m  AORTA Ao Root diam: 3.40 cm Ao Asc diam:  3.00 cm MITRAL VALVE MV Area (PHT): 2.83 cm     SHUNTS MV Decel Time: 268 msec     Systemic VTI:  0.12 m MV E velocity: 73.90 cm/s   Systemic Diam: 2.00 cm MV A velocity: 102.00 cm/s MV E/A ratio:  0.72 Gwyndolyn Kaufman MD Electronically signed by Gwyndolyn Kaufman MD Signature Date/Time: 01/27/2021/3:32:43 PM    Final         Scheduled Meds:  clopidogrel  75 mg Oral Daily   furosemide  40 mg Intravenous BID   guaiFENesin  600 mg Oral BID   insulin aspart  0-15 Units Subcutaneous TID WC   insulin aspart  0-5 Units Subcutaneous QHS   insulin glargine  30 Units Subcutaneous Daily   metoprolol succinate  50 mg Oral BID   mycophenolate  1,000 mg Oral BID   pantoprazole  40 mg Oral Daily   polyethylene glycol  17 g Oral Daily   predniSONE  5 mg Oral Q breakfast   ranolazine  500 mg Oral Daily   rosuvastatin  40 mg Oral Daily   sodium chloride flush  3  mL Intravenous Q12H   Warfarin - Pharmacist Dosing Inpatient   Does not apply q1600   Continuous Infusions:  heparin 1,050 Units/hr (01/27/21 1921)     LOS: 1 day    Time spent: 25 mins,More than 50% of that time was spent in counseling and/or coordination of care.      Shelly Coss, MD [facility] P7/09/2020, 7:44 AM

## 2021-01-28 NOTE — Progress Notes (Signed)
[place] for Heparin/Coumadin (while INR is low) Indication: H/O DVT, afib  No Known Allergies  Patient Measurements: Height: 5\' 5"  (165.1 cm) Weight: 78.7 kg (173 lb 8 oz) IBW/kg (Calculated) : 61.5  Vital Signs: Temp: 98.4 F (36.9 C) (07/03 0020) Temp Source: Oral (07/03 0020) BP: 120/72 (07/03 0020) Pulse Rate: 82 (07/03 0020)  Labs: Recent Labs    01/26/21 1426 01/26/21 1728 01/27/21 0352 01/27/21 1919 01/28/21 0120  HGB 14.8  --  14.1  --  13.6  HCT 47.6  --  43.4  --  41.6  PLT 339  --  292  --  277  LABPROT 14.5  --  15.1  --  16.7*  INR 1.1  --  1.2  --  1.4*  HEPARINUNFRC  --   --   --   --  0.30  CREATININE 1.93*  --  1.68* 1.81* 1.70*  TROPONINIHS 59* 59*  --   --   --      Estimated Creatinine Clearance: 41.9 mL/min (A) (by C-G formula based on SCr of 1.7 mg/dL (H)).   Medical History: Past Medical History:  Diagnosis Date   Abnormal nuclear stress test 02/19/2018   Angina, class III ([place]) 02/19/2018   Anxiety    Chronic combined systolic and diastolic CHF (congestive heart failure) (HCC)    Chronic kidney disease, stage 3b (HCC)    Coronary artery disease    DDD (degenerative disc disease), cervical 03/01/2013   Diabetes mellitus    type 2   Diverticular disease 01/12/2014   mild   DVT (deep venous thrombosis) (HCC)    Dyslipidemia    Hypertension    ICD (implantable cardioverter-defibrillator) in place    ILD (interstitial lung disease) ([place])    Lumbosacral radiculopathy at L4 02/18/2017   Myocardial infarction ([place]) 2012   Obesity    Snoring 10/12/2013   TIA (transient ischemic attack) 08/09/2014   Tubular adenoma of colon 01/12/2014   transverse colon   VF (ventricular fibrillation) ([place])    arrest   Vitamin D deficiency 02/18/2017     Assessment: 65 y.o. male admitted with SOB/CHF. Patient is on warfarin prior to admission for DVT in February 2022, also has afib. Pharmacy consulted to  dose warfarin. Patient reports missing 2-3 doses last week but does not routinely miss doses. INR 1.2.  Prior to admission warfarin regimen: 5mg  daily except 2.5mg  on Sundays   Hgb 14.1, plt 292. INR 1.2. Given high CHADS-VASC and questionable compliance, will bridge with heparin until INR therapeutic.    7/3 AM update:  Heparin level therapeutic  INR 1.4  Goal of Therapy:  Heparin level 0.3-0.7 units/mL INR 2-3 Monitor platelets by anticoagulation protocol: Yes   Plan:  Cont heparin 1050 units/hr 1000 heparin level  Narda Bonds, PharmD, BCPS Clinical Pharmacist Phone: [phone]

## 2021-01-28 NOTE — Progress Notes (Signed)
ANTICOAGULATION CONSULT NOTE - Follow Up Consult  Pharmacy Consult for Coumadin and Heparin bridge while INR subtherapeutic Indication: H/O DVT  No Known Allergies  Patient Measurements: Height: 5\' 5"  (165.1 cm) Weight: 77 kg (169 lb 12.8 oz) (scale c) IBW/kg (Calculated) : 61.5  Vital Signs: Temp: 97.9 F (36.6 C) (07/03 1811) Temp Source: Oral (07/03 1811) BP: 111/72 (07/03 1811) Pulse Rate: 77 (07/03 1811)  Labs: Recent Labs    01/26/21 1426 01/26/21 1728 01/27/21 0352 01/27/21 1919 01/28/21 0120 01/28/21 0949 01/28/21 1830  HGB 14.8  --  14.1  --  13.6  --   --   HCT 47.6  --  43.4  --  41.6  --   --   PLT 339  --  292  --  277  --   --   LABPROT 14.5  --  15.1  --  16.7*  --   --   INR 1.1  --  1.2  --  1.4*  --   --   HEPARINUNFRC  --   --   --   --  0.30 0.27* 0.69  CREATININE 1.93*  --  1.68* 1.81* 1.70*  --   --   TROPONINIHS 59* 59*  --   --   --   --   --      Estimated Creatinine Clearance: 41.5 mL/min (A) (by C-G formula based on SCr of 1.7 mg/dL (H)).   Medical History: Past Medical History:  Diagnosis Date   Abnormal nuclear stress test 02/19/2018   Angina, class III ([place]) 02/19/2018   Anxiety    Chronic combined systolic and diastolic CHF (congestive heart failure) (HCC)    Chronic kidney disease, stage 3b (HCC)    Coronary artery disease    DDD (degenerative disc disease), cervical 03/01/2013   Diabetes mellitus    type 2   Diverticular disease 01/12/2014   mild   DVT (deep venous thrombosis) (HCC)    Dyslipidemia    Hypertension    ICD (implantable cardioverter-defibrillator) in place    ILD (interstitial lung disease) ([place])    Lumbosacral radiculopathy at L4 02/18/2017   Myocardial infarction ([place]) 2012   Obesity    Snoring 10/12/2013   TIA (transient ischemic attack) 08/09/2014   Tubular adenoma of colon 01/12/2014   transverse colon   VF (ventricular fibrillation) ([place])    arrest   Vitamin D deficiency 02/18/2017     Medications:  No current facility-administered medications on file prior to encounter.   Current Outpatient Medications on File Prior to Encounter  Medication Sig Dispense Refill   clopidogrel (PLAVIX) 75 MG tablet Take 75 mg by mouth daily.     furosemide (LASIX) 40 MG tablet Take 40 mg by mouth daily.     insulin aspart (NOVOLOG) 100 UNIT/ML injection Substitute to any brand approved.Before each meal 3 times a day, 140-199 - 2 units, 200-250 - 4 units, 251-299 - 6 units,  300-349 - 8 units,  350 or above 10 units. Dispense syringes and needles as needed, Ok to switch to PEN if approved. DX DM2, Code E11.65 (Patient taking differently: Substitute to any brand approved.Before each meal 3 times a day, 140-199 - 2 units, 200-250 - 4 units, 251-299 - 6 units,  300-349 - 8 units,  350 or above 10 units. Dispense syringes and needles as needed, Ok to switch to PEN if approved. DX DM2, Code E11.65) 10 mL 0   insulin glargine (LANTUS) 100 UNIT/ML Solostar Pen  Inject 60 Units into the skin daily before breakfast.     metoprolol succinate (TOPROL-XL) 50 MG 24 hr tablet Take 1 tablet (50 mg total) by mouth in the morning and at bedtime. Take with or immediately following a meal. 180 tablet 1   mycophenolate (CELLCEPT) 500 MG tablet Take 1,000 mg by mouth 2 (two) times daily.     nitroGLYCERIN (NITROSTAT) 0.4 MG SL tablet PLACE 1 TABLET UNDER THE TONGUE EVERY 5 MINUTES AS NEEDED FOR CHEST PAIN 25 tablet 0   OZEMPIC, 1 MG/DOSE, 4 MG/3ML SOPN Inject 1 mg into the skin once a week.     pantoprazole (PROTONIX) 40 MG tablet Take 40 mg by mouth daily.     predniSONE (DELTASONE) 5 MG tablet 8 Pills PO for 3 days,6 Pills PO for 3 days,4 Pills PO for 3 days,2 Pills PO for 3 days,1 Pills PO for 3 days,1/2 Pill  PO for 3 days then STOP. (Patient taking differently: Take 5 mg by mouth daily with breakfast.) 65 tablet 0   ranolazine (RANEXA) 500 MG 12 hr tablet TAKE ONE TABLET BY MOUTH TWICE DAILY.PLEASE MAKE APPT WITH  DR.HARDING FOR REFILLS.  (Patient taking differently: Take 500 mg by mouth daily.) 60 tablet 2   vitamin B-12 (CYANOCOBALAMIN) 1000 MCG tablet TAKE 1 TABLET (1,000 MCG TOTAL) BY MOUTH DAILY. 30 tablet 0   warfarin (COUMADIN) 5 MG tablet TAKE 1 TABLET (5 MG TOTAL) BY MOUTH DAILY AT 4 PM. (Patient taking differently: Take 2.5-5 mg by mouth See admin instructions. Take 1 tablet (5 mg) daily except on Sun Take 1/2 tablet (2.5 mg)) 7 tablet 0   Insulin Syringe-Needle U-100 25G X 1" 1 ML MISC For 4 times a day insulin SQ, 1 month supply. Diagnosis E11.65 30 each 0   rosuvastatin (CRESTOR) 40 MG tablet Take 1 tablet (40 mg total) by mouth daily. 90 tablet 3     Assessment: 65 y.o. male admitted with SOB/CHF. Patient is on warfarin prior to admission for DVT in February 2022. Pharmacy consulted to dose warfarin. Patient reports missing 2-3 doses last week but does not routinely miss doses. Given high CHADS-VASC and questionable compliance, pharmacy consulted to bridge with heparin until INR therapeutic. INR 1.4  Prior to admission warfarin regimen: 5mg  daily except 2.5mg  on Sundays   Heparin level this evening came back on higher end of goal range at 0.69, on 1150 units/hr. Hgb 13.6, plt 277 this morning. No IV infusion issues or bleeding per nursing.   Goal of Therapy:  INR 2-3 Monitor platelets by anticoagulation protocol: Yes   Plan:  Received warfarin 5 mg tonight Decrease heparin infusion to 1100 units/hr Monitor heparin level, CBC and s/s of bleeding daily  Antonietta Jewel, PharmD, [place] Pharmacist  Phone: [phone] 01/28/2021 7:32 PM  Please check AMION for all [place] phone numbers After 10:00 PM, call [place] [phone]

## 2021-01-29 LAB — GLUCOSE, CAPILLARY
Glucose-Capillary: 122 mg/dL — ABNORMAL HIGH (ref 70–99)
Glucose-Capillary: 134 mg/dL — ABNORMAL HIGH (ref 70–99)
Glucose-Capillary: 144 mg/dL — ABNORMAL HIGH (ref 70–99)
Glucose-Capillary: 148 mg/dL — ABNORMAL HIGH (ref 70–99)

## 2021-01-29 LAB — HEPARIN LEVEL (UNFRACTIONATED): Heparin Unfractionated: 0.54 IU/mL (ref 0.30–0.70)

## 2021-01-29 LAB — BASIC METABOLIC PANEL
Anion gap: 9 (ref 5–15)
BUN: 24 mg/dL — ABNORMAL HIGH (ref 8–23)
CO2: 28 mmol/L (ref 22–32)
Calcium: 9.1 mg/dL (ref 8.9–10.3)
Chloride: 97 mmol/L — ABNORMAL LOW (ref 98–111)
Creatinine, Ser: 1.83 mg/dL — ABNORMAL HIGH (ref 0.61–1.24)
GFR, Estimated: 40 mL/min — ABNORMAL LOW (ref >60)
Glucose, Bld: 109 mg/dL — ABNORMAL HIGH (ref 70–99)
Potassium: 4.2 mmol/L (ref 3.5–5.1)
Sodium: 134 mmol/L — ABNORMAL LOW (ref 135–145)

## 2021-01-29 LAB — CBC WITH DIFFERENTIAL/PLATELET
Abs Immature Granulocytes: 0.21 10*3/uL — ABNORMAL HIGH (ref 0.00–0.07)
Basophils Absolute: 0.1 10*3/uL (ref 0.0–0.1)
Basophils Relative: 0 %
Eosinophils Absolute: 0.5 10*3/uL (ref 0.0–0.5)
Eosinophils Relative: 3 %
HCT: 43.6 % (ref 39.0–52.0)
Hemoglobin: 13.9 g/dL (ref 13.0–17.0)
Immature Granulocytes: 2 %
Lymphocytes Relative: 17 %
Lymphs Abs: 2.3 10*3/uL (ref 0.7–4.0)
MCH: 28.7 pg (ref 26.0–34.0)
MCHC: 31.9 g/dL (ref 30.0–36.0)
MCV: 89.9 fL (ref 80.0–100.0)
Monocytes Absolute: 1.1 10*3/uL — ABNORMAL HIGH (ref 0.1–1.0)
Monocytes Relative: 8 %
Neutro Abs: 9.9 10*3/uL — ABNORMAL HIGH (ref 1.7–7.7)
Neutrophils Relative %: 70 %
Platelets: 269 10*3/uL (ref 150–400)
RBC: 4.85 MIL/uL (ref 4.22–5.81)
RDW: 14.5 % (ref 11.5–15.5)
WBC: 14 10*3/uL — ABNORMAL HIGH (ref 4.0–10.5)
nRBC: 0 % (ref 0.0–0.2)

## 2021-01-29 LAB — PROTIME-INR
INR: 1.5 — ABNORMAL HIGH (ref 0.8–1.2)
Prothrombin Time: 17.6 seconds — ABNORMAL HIGH (ref 11.4–15.2)

## 2021-01-29 MED ORDER — WARFARIN SODIUM 7.5 MG PO TABS
7.5000 mg | ORAL_TABLET | Freq: Once | ORAL | Status: AC
  Administered 2021-01-29: 7.5 mg via ORAL
  Filled 2021-01-29: qty 1

## 2021-01-29 NOTE — Progress Notes (Addendum)
PROGRESS NOTE    Eugene [name]  RCV:[id number] DOB: 1955/09/18 DOA: 01/26/2021 PCP: [name], MD   Chief Complain: Shortness of breath  Brief Narrative: Patient is a 65 year old male with history of CVA, coronary artery artery disease status post CABG, systolic CHF, CKD stage III, degenerative disc disease, cystic fibrosis, diabetes type 2, GERD, hyperlipidemia, peripheral artery disease, hypertension ,prolonged QT, V. fib status post AICD placement, interstitial lung disease, sleep apnea, anxiety who presented with shortness of breath, cough, increased bilateral lower extremity edema.  Patient also reported that his AICD had fired twice recently.  He follows with Dr. Lovena Le, [place].  On presentation he was hypoxic on room air requiring supplemental oxygen, he does not use oxygen at home.  CBC showed leukocytosis, he had elevated BNP at 419.  Chest x-ray showed diffuse reticular pattern consistent with ILD, cardiomegaly.  Patient was admitted for the management of acute exacerbation of CHF, started on IV Lasix.  Cardiology also consulted and following.  Assessment & Plan:   Principal Problem:   Acute exacerbation of CHF (congestive heart failure) (HCC) Active Problems:   Hyperlipidemia   HYPERTENSION, BENIGN   CAD, NATIVE VESSEL   VENTRICULAR FIBRILLATION   Single implantable cardioverter-defibrillator (ICD) in situ   PAD (peripheral artery disease) ([place])   Coronary artery disease involving coronary bypass graft of native heart with angina pectoris (HCC)   History of CVA (cerebrovascular accident)   Gastroesophageal reflux disease without esophagitis   Chronic kidney disease, stage 3b (HCC)   ILD (interstitial lung disease) ([place])  Acute on chronic combined  systolic/diastolic congestive heart failure: Presented with cough, shortness of breath, bilateral lower extremity swelling.  Hypoxic on presentation.  Takes Lasix 60 mg once a day at home. Patient was found to be volume overloaded on  presentation.  Elevated BNP.  Started on IV Lasix.  Continue to monitor input and output, daily weight. Echo done on this admission showed ejection fraction of 45 to 50%, regional wall motion abnormality in the left ventricle, grade 1 diastolic dysfunction cardiology also consulted and following.  We will continue diuresis.Plan for cardiac MRI  History of V. fib status post AICD: He reports that his AICD fired twice, last firing was last Friday(6/24), he had called the office of Dr. Lovena Le and he was advised just to monitor.  I have requested cardiology evaluation here and following  Acute hypoxic respiratory failure: Most likely secondary to combination of CHF exacerbation, interstitial lung disease.  He does not use oxygen at home.  Was requiring 3 to 4 L of oxygen per minute,now on 1 L.  He feels better symptomatically.  We will try to wean the oxygen.Chest x-ray showed diffuse  reticular pattern consistent with ILD, cardiomegaly. He follows with Dr Chase Caller as an outpatient.  History of coronary artery disease: Status post CABG.  Denies chest pain.  Minimally elevated flat trended troponin.  Continue home metoprolol, Plavix, aspirin  History of CVA/peripheral artery disease, hyperlipidemia: On Crestor and Plavix at home.  History of ILD: Follows with rheumatology.  Takes prednisone, mycophenolate.  Has positive double-stranded DNA.  We recommend to follow-up with rheumatology as outpatient.I talked to his rheumatologist Dr Kathlene November on 01/29/21.  Leukocytosis: Chronic, most likely associated with prednisone therapy.  Continue to monitor.  Diabetes type 2: Monitor blood sugars.  Continue Lantus and sliding scale insulin.  CKD stage IIIb: Currently kidney function at baseline.  Continue to monitor while on diuresis.  History of DVT: On warfarin at home, reported that  he missed few doses.  Currently on heparin drip and warfarin and will continue until INR is therapeutic.  Paroxysmal A. fib:  Continue anticoagulation.  On metoprolol.  Currently rate is controlled.  And on sinus rhythm  Debility/deconditioning: PT/OT consulted           DVT prophylaxis:Warfarin/heparin Code Status: Full Family Communication: None at the bedside Status is: Inpatient   Dispo: The patient is from: Home              Anticipated d/c is to: Home              Patient currently is not medically stable to d/c.   Difficult to place patient No      Consultants: cardiology  Procedures:None  Antimicrobials:  Anti-infectives (From admission, onward)    None       Subjective:  Patient seen and examined the bedside this morning.  Medically stable.  On 1L  of oxygen.  He says his breathing and cough is better.  Complains of lack of sleep.   Objective: Vitals:   01/28/21 1100 01/28/21 1811 01/28/21 1945 01/29/21 0355  BP: 123/77 111/72 101/68 104/90  Pulse: 81 77 73 (!) 53  Resp: 19 18 18 18   Temp: 98.1 F (36.7 C) 97.9 F (36.6 C) 97.9 F (36.6 C) 97.8 F (36.6 C)  TempSrc: Oral Oral Oral Oral  SpO2: 97% 100% 95% 98%  Weight:    76.2 kg  Height:        Intake/Output Summary (Last 24 hours) at 01/29/2021 1046 Last data filed at 01/28/2021 2102 Gross per 24 hour  Intake 960 ml  Output 375 ml  Net 585 ml   Filed Weights   01/27/21 0224 01/28/21 0441 01/29/21 0355  Weight: 78.7 kg 77 kg 76.2 kg    Examination: General exam: Overall comfortable, not in distress HEENT: PERRL Respiratory system:  no wheezes or crackles  Cardiovascular system: S1 & S2 heard, RRR.  Gastrointestinal system: Abdomen is nondistended, soft and nontender. Central nervous system: Alert and oriented Extremities: No edema, no clubbing ,no cyanosis Skin: No rashes, no ulcers,no icterus     Data Reviewed: I have personally reviewed following labs and imaging studies  CBC: Recent Labs  Lab 01/26/21 1426 01/27/21 0352 01/28/21 0120 01/29/21 0252  WBC 18.3* 14.9* 12.7* 14.0*  NEUTROABS  --    --  8.9* 9.9*  HGB 14.8 14.1 13.6 13.9  HCT 47.6 43.4 41.6 43.6  MCV 92.6 89.1 88.7 89.9  PLT 339 292 277 314   Basic Metabolic Panel: Recent Labs  Lab 01/26/21 1426 01/27/21 0352 01/27/21 1919 01/28/21 0120 01/29/21 0252  NA 137 137 135 133* 134*  K 4.6 3.4* 4.4 4.3 4.2  CL 101 102 98 98 97*  CO2 25 24 28 24 28   GLUCOSE 187* 176* 127* 139* 109*  BUN 28* 25* 27* 28* 24*  CREATININE 1.93* 1.68* 1.81* 1.70* 1.83*  CALCIUM 9.2 8.9 9.0 8.6* 9.1  MG  --  2.2  --   --   --    GFR: Estimated Creatinine Clearance: 38.4 mL/min (A) (by C-G formula based on SCr of 1.83 mg/dL (H)). Liver Function Tests: No results for input(s): AST, ALT, ALKPHOS, BILITOT, PROT, ALBUMIN in the last 168 hours. No results for input(s): LIPASE, AMYLASE in the last 168 hours. No results for input(s): AMMONIA in the last 168 hours. Coagulation Profile: Recent Labs  Lab 01/26/21 1426 01/27/21 0352 01/28/21 0120 01/29/21 0252  INR 1.1  1.2 1.4* 1.5*   Cardiac Enzymes: No results for input(s): CKTOTAL, CKMB, CKMBINDEX, TROPONINI in the last 168 hours. BNP (last 3 results) Recent Labs    07/26/20 1033 08/04/20 1127  PROBNP 2,139* 2,329*   HbA1C: No results for input(s): HGBA1C in the last 72 hours. CBG: Recent Labs  Lab 01/28/21 0604 01/28/21 1108 01/28/21 1708 01/28/21 2113 01/29/21 0550  GLUCAP 102* 125* 177* 165* 122*   Lipid Profile: No results for input(s): CHOL, HDL, LDLCALC, TRIG, CHOLHDL, LDLDIRECT in the last 72 hours. Thyroid Function Tests: No results for input(s): TSH, T4TOTAL, FREET4, T3FREE, THYROIDAB in the last 72 hours. Anemia Panel: No results for input(s): VITAMINB12, FOLATE, FERRITIN, TIBC, IRON, RETICCTPCT in the last 72 hours. Sepsis Labs: No results for input(s): PROCALCITON, LATICACIDVEN in the last 168 hours.  Recent Results (from the past 240 hour(s))  Resp Panel by RT-PCR (Flu A&B, Covid) Nasopharyngeal Swab     Status: None   Collection Time: 01/26/21  11:14 PM   Specimen: Nasopharyngeal Swab; Nasopharyngeal(NP) swabs in vial transport medium  Result Value Ref Range Status   SARS Coronavirus 2 by RT PCR NEGATIVE NEGATIVE Final    Comment: (NOTE) SARS-CoV-2 target nucleic acids are NOT DETECTED.  The SARS-CoV-2 RNA is generally detectable in upper respiratory specimens during the acute phase of infection. The lowest concentration of SARS-CoV-2 viral copies this assay can detect is 138 copies/mL. A negative result does not preclude SARS-Cov-2 infection and should not be used as the sole basis for treatment or other patient management decisions. A negative result may occur with  improper specimen collection/handling, submission of specimen other than nasopharyngeal swab, presence of viral mutation(s) within the areas targeted by this assay, and inadequate number of viral copies(<138 copies/mL). A negative result must be combined with clinical observations, patient history, and epidemiological information. The expected result is Negative.  Fact Sheet for Patients:  EntrepreneurPulse.com.au  Fact Sheet for Healthcare Providers:  IncredibleEmployment.be  This test is no t yet approved or cleared by the [place] FDA and  has been authorized for detection and/or diagnosis of SARS-CoV-2 by FDA under an Emergency Use Authorization (EUA). This EUA will remain  in effect (meaning this test can be used) for the duration of the COVID-19 declaration under Section 564(b)(1) of the Act, 21 U.S.C.section 360bbb-3(b)(1), unless the authorization is terminated  or revoked sooner.       Influenza A by PCR NEGATIVE NEGATIVE Final   Influenza B by PCR NEGATIVE NEGATIVE Final    Comment: (NOTE) The Xpert Xpress SARS-CoV-2/FLU/RSV plus assay is intended as an aid in the diagnosis of influenza from Nasopharyngeal swab specimens and should not be used as a sole basis for treatment. Nasal washings and aspirates  are unacceptable for Xpert Xpress SARS-CoV-2/FLU/RSV testing.  Fact Sheet for Patients: EntrepreneurPulse.com.au  Fact Sheet for Healthcare Providers: IncredibleEmployment.be  This test is not yet approved or cleared by the [place] FDA and has been authorized for detection and/or diagnosis of SARS-CoV-2 by FDA under an Emergency Use Authorization (EUA). This EUA will remain in effect (meaning this test can be used) for the duration of the COVID-19 declaration under Section 564(b)(1) of the Act, 21 U.S.C. section 360bbb-3(b)(1), unless the authorization is terminated or revoked.  Performed at [facility], [address] [place], [place], [place] [address]          Radiology Studies: ECHOCARDIOGRAM COMPLETE  Result Date: 01/27/2021    ECHOCARDIOGRAM REPORT   Patient Name:   [facility]  Viona Gilmore [facility] Date of Exam: 01/27/2021 Medical Rec #:  [id number]    Height:       65.0 in Accession #:    [id number]   Weight:       173.5 lb Date of Birth:  [birth year]   BSA:          1.862 m Patient Age:    50 years     BP:           96/64 mmHg Patient Gender: M            HR:           76 bpm. Exam Location:  Inpatient Procedure: 2D Echo, Color Doppler, Cardiac Doppler and Intracardiac            Opacification Agent Indications:    E08.14 Acute systolic (congestive) heart failure  History:        Patient has prior history of Echocardiogram examinations, most                 recent 09/14/2020. CHF, CAD, Defibrillator; Risk                 Factors:Hypertension, Diabetes, Dyslipidemia, Sleep Apnea and                 COVID+ 08/2020.  Sonographer:    [place] Referring Phys: [phone] Grier Czerwinski IMPRESSIONS  1. Left ventricular ejection fraction, by estimation, is 45 to 50%. The left ventricle has mildly decreased function. The left ventricle demonstrates regional wall motion abnormalities.     There is hypokinesis of the basal-to-mid anteroseptal, basal-to-mid anterior,  basal-to-mid inferior and basal inferoseptal LV walls. Left ventricular diastolic parameters are consistent with Grade I diastolic dysfunction (impaired relaxation).  2. Right ventricular systolic function is mildly reduced. The right ventricular size is not well visualized.  3. The mitral valve is grossly normal. Trivial mitral valve regurgitation. No evidence of mitral stenosis.  4. The aortic valve is tricuspid. There is mild calcification of the aortic valve. There is moderate thickening of the aortic valve. Aortic valve regurgitation is not visualized. Mild to moderate aortic valve sclerosis/calcification is present, without any evidence of aortic stenosis. Comparison(s): Compared to prior limited TTE in 09/14/20, there is no significant change. There continues to be WMA as described above. FINDINGS  Left Ventricle: Left ventricular ejection fraction, by estimation, is 45 to 50%. The left ventricle has mildly decreased function. The left ventricle demonstrates regional wall motion abnormalities. Definity contrast agent was given IV to delineate the left ventricular endocardial borders. There is hypokinesis of the basal-to-mid anteroseptal, basal-to-mid anterior, basal-to-mid inferior and basal inferoseptal LV walls. The left ventricular internal cavity size was normal in size. There is no left ventricular hypertrophy. Left ventricular diastolic parameters are consistent with Grade I diastolic dysfunction (impaired relaxation). Right Ventricle: The right ventricular size is not well visualized. Right vetricular wall thickness was not well visualized. Right ventricular systolic function is mildly reduced. Left Atrium: Left atrial size was normal in size. Right Atrium: Right atrial size was normal in size. Pericardium: There is no evidence of pericardial effusion. Mitral Valve: The mitral valve is grossly normal. There is mild thickening of the mitral valve leaflet(s). There is mild calcification of the mitral  valve leaflet(s). Mild to moderate mitral annular calcification. Trivial mitral valve regurgitation. No evidence of mitral valve stenosis. Tricuspid Valve: The tricuspid valve is normal in structure. Tricuspid valve regurgitation is trivial. Aortic Valve: The aortic valve is tricuspid.  There is mild calcification of the aortic valve. There is moderate thickening of the aortic valve. Aortic valve regurgitation is not visualized. Mild to moderate aortic valve sclerosis/calcification is present, without any evidence of aortic stenosis. Pulmonic Valve: The pulmonic valve was not well visualized. Pulmonic valve regurgitation is trivial. Aorta: The aortic root and ascending aorta are structurally normal, with no evidence of dilitation. IAS/Shunts: No atrial level shunt detected by color flow Doppler. Additional Comments: A device lead is visualized.  LEFT VENTRICLE PLAX 2D LVIDd:         4.20 cm  Diastology LVIDs:         3.50 cm  LV e' medial:    3.90 cm/s LV PW:         0.90 cm  LV E/e' medial:  18.9 LV IVS:        0.80 cm  LV e' lateral:   4.95 cm/s LVOT diam:     2.00 cm  LV E/e' lateral: 14.9 LV SV:         39 LV SV Index:   21 LVOT Area:     3.14 cm  RIGHT VENTRICLE RV S prime:     5.78 cm/s TAPSE (M-mode): 1.7 cm LEFT ATRIUM             Index       RIGHT ATRIUM           Index LA diam:        3.50 cm 1.88 cm/m  RA Area:     19.30 cm LA Vol (A2C):   28.2 ml 15.14 ml/m RA Volume:   53.00 ml  28.46 ml/m LA Vol (A4C):   46.7 ml 25.08 ml/m LA Biplane Vol: 35.9 ml 19.28 ml/m  AORTIC VALVE LVOT Vmax:   67.90 cm/s LVOT Vmean:  47.800 cm/s LVOT VTI:    0.124 m  AORTA Ao Root diam: 3.40 cm Ao Asc diam:  3.00 cm MITRAL VALVE MV Area (PHT): 2.83 cm     SHUNTS MV Decel Time: 268 msec     Systemic VTI:  0.12 m MV E velocity: 73.90 cm/s   Systemic Diam: 2.00 cm MV A velocity: 102.00 cm/s MV E/A ratio:  0.72 Gwyndolyn Kaufman MD Electronically signed by Gwyndolyn Kaufman MD Signature Date/Time: 01/27/2021/3:32:43 PM    Final          Scheduled Meds:  clopidogrel  75 mg Oral Daily   furosemide  40 mg Intravenous BID   guaiFENesin  600 mg Oral BID   guaiFENesin-dextromethorphan  10 mL Oral Q6H   insulin aspart  0-15 Units Subcutaneous TID WC   insulin aspart  0-5 Units Subcutaneous QHS   insulin glargine  30 Units Subcutaneous Daily   metoprolol succinate  50 mg Oral BID   mycophenolate  1,000 mg Oral BID   pantoprazole  40 mg Oral Daily   polyethylene glycol  17 g Oral Daily   predniSONE  5 mg Oral Q breakfast   ranolazine  500 mg Oral Daily   rosuvastatin  40 mg Oral Daily   sodium chloride flush  3 mL Intravenous Q12H   warfarin  7.5 mg Oral ONCE-1600   Warfarin - Pharmacist Dosing Inpatient   Does not apply q1600   Continuous Infusions:  heparin 1,100 Units/hr (01/28/21 1956)     LOS: 2 days    Time spent: 25 mins,More than 50% of that time was spent in counseling and/or coordination of care.  Shelly Coss, MD [facility] P7/10/2020, 10:46 AM

## 2021-01-29 NOTE — Progress Notes (Signed)
Physical Therapy Treatment Patient Details Name: Eugene [name] MRN: [id number] DOB: 08-30-1955 Today's Date: 01/29/2021    History of Present Illness Pt is a 65 y.o. male admitted 01/26/2021 with ongoing intermitten shortness of breath. CXR with diffuse reticular pattern bilaterally consistent with ILD with mild cardiomegaly. PMH includes CVA, CAD s/p CABG, CHF, CKD 3B, DDD, diabetes, GERD, hyperlipidemia, peripheral arterial disease, hypertension, long QT, V. fib status post AICD, ILD, sleep apnea, CAD (s/p CABG, PCI; vfib arrest s/p ICD), CKD IV, HTN, DM.    PT Comments    Pt with improved tolerance for ambulation with less significant work of breathing noted when ambulating. Pt requires less supplemental oxygen with activity this session and will benefit from continued aggressive mobilization to continue progressing activity tolerance. PT provides education on use of incentive spirometer for when 3E unit is restocked and one if available to be provided to the patient.   Follow Up Recommendations  Home health PT     Equipment Recommendations  None recommended by PT    Recommendations for Other Services       Precautions / Restrictions Precautions Precautions: Fall Precaution Comments: watch O2 Restrictions Weight Bearing Restrictions: No    Mobility  Bed Mobility Overal bed mobility: Modified Independent Bed Mobility: Supine to Sit;Sit to Supine                Transfers Overall transfer level: Independent Equipment used: None Transfers: Sit to/from Stand Sit to Stand: Independent            Ambulation/Gait Ambulation/Gait assistance: Supervision Gait Distance (Feet): 250 Feet Assistive device: None Gait Pattern/deviations: Step-through pattern Gait velocity: functional Gait velocity interpretation: 1.31 - 2.62 ft/sec, indicative of limited community ambulator General Gait Details: pt with step-through gait, mild drift laterally when ambulating   Stairs              Wheelchair Mobility    Modified Rankin (Stroke Patients Only)       Balance Overall balance assessment: Mild deficits observed, not formally tested                                          Cognition Arousal/Alertness: Awake/alert Behavior During Therapy: WFL for tasks assessed/performed Overall Cognitive Status: Within Functional Limits for tasks assessed                                        Exercises      General Comments General comments (skin integrity, edema, etc.): pt on 1L [place] upon arrival, desats to mid-80s on 2L [place], PT increases to 4L [place] with improvement in sats to 92%. PT then attempts to wean to 3L with sats from 88-91% with completion of ambulation. Pt returned to 1L [place] at rest      Pertinent Vitals/Pain Pain Assessment: No/denies pain    Home Living                      Prior Function            PT Goals (current goals can now be found in the care plan section) Acute Rehab PT Goals Patient Stated Goal: to be able to mow the yard again Progress towards PT goals: Progressing toward goals    Frequency  Min 3X/week      PT Plan Current plan remains appropriate    Co-evaluation              AM-PAC PT "6 Clicks" Mobility   Outcome Measure  Help needed turning from your back to your side while in a flat bed without using bedrails?: None Help needed moving from lying on your back to sitting on the side of a flat bed without using bedrails?: None Help needed moving to and from a bed to a chair (including a wheelchair)?: None Help needed standing up from a chair using your arms (e.g., wheelchair or bedside chair)?: None Help needed to walk in hospital room?: A Little Help needed climbing 3-5 steps with a railing? : A Little 6 Click Score: 22    End of Session Equipment Utilized During Treatment: Oxygen Activity Tolerance: Patient tolerated treatment well Patient left: in bed;with  call bell/phone within reach Nurse Communication: Mobility status PT Visit Diagnosis: Other abnormalities of gait and mobility (R26.89);Muscle weakness (generalized) (M62.81);Difficulty in walking, not elsewhere classified (R26.2)     Time: 1518-3437 PT Time Calculation (min) (ACUTE ONLY): 15 min  Charges:  $Therapeutic Activity: 8-22 mins                     Zenaida Niece, PT, DPT Acute Rehabilitation Pager: [phone]    Zenaida Niece 01/29/2021, 2:53 PM

## 2021-01-29 NOTE — Progress Notes (Signed)
Progress Note  Patient Name: Eugene [name] Date of Encounter: 01/30/2021  [facility] Cardiologist: Werner Lean, MD   Subjective   Doing better this morning. Ambulated the hallways. Continuing to require supplemental O2.   Device not compatible with MRI Cr stable at 1.85 Wt 168>166; I/Os inaccurate  Inpatient Medications    Scheduled Meds:  clopidogrel  75 mg Oral Daily   furosemide  40 mg Intravenous BID   guaiFENesin  600 mg Oral BID   guaiFENesin-dextromethorphan  10 mL Oral Q6H   insulin aspart  0-15 Units Subcutaneous TID WC   insulin aspart  0-5 Units Subcutaneous QHS   insulin glargine  30 Units Subcutaneous Daily   metoprolol succinate  50 mg Oral BID   mycophenolate  1,000 mg Oral BID   pantoprazole  40 mg Oral Daily   polyethylene glycol  17 g Oral Daily   predniSONE  5 mg Oral Q breakfast   ranolazine  500 mg Oral Daily   rosuvastatin  40 mg Oral Daily   sodium chloride flush  3 mL Intravenous Q12H   warfarin  7.5 mg Oral ONCE-1600   Warfarin - Pharmacist Dosing Inpatient   Does not apply q1600   Continuous Infusions:  heparin 1,400 Units/hr (01/30/21 0929)   PRN Meds: acetaminophen **OR** acetaminophen   Vital Signs    Vitals:   01/29/21 1205 01/29/21 1929 01/29/21 2130 01/30/21 0413  BP: 115/80 108/65 114/74 131/74  Pulse: 72 70 72 64  Resp: 20 19  18   Temp: 98.4 F (36.9 C) 97.6 F (36.4 C)  98 F (36.7 C)  TempSrc: Oral Oral  Oral  SpO2: 95% 94%  94%  Weight:    75.6 kg  Height:        Intake/Output Summary (Last 24 hours) at 01/30/2021 0938 Last data filed at 01/30/2021 0848 Gross per 24 hour  Intake 240 ml  Output 400 ml  Net -160 ml   Last 3 Weights 01/30/2021 01/29/2021 01/28/2021  Weight (lbs) 166 lb 9.6 oz 168 lb 169 lb 12.8 oz  Weight (kg) 75.569 kg 76.204 kg 77.021 kg      Telemetry    NSR - Personally Reviewed  ECG    No new tracing 01/30/21- Personally Reviewed  Physical Exam   GEN: No acute distress.    Neck: JVD difficult to assess due to body habitus Cardiac: RRR, no murmurs, rubs, or gallops.  Respiratory: CTAB, no wheezes GI: Obese, soft, nontender, non-distended  MS: Warm, no edema Neuro:  Nonfocal  Psych: Normal affect   Labs    High Sensitivity Troponin:   Recent Labs  Lab 01/26/21 1426 01/26/21 1728  TROPONINIHS 59* 59*      Chemistry Recent Labs  Lab 01/28/21 0120 01/29/21 0252 01/30/21 0301  NA 133* 134* 136  K 4.3 4.2 3.4*  CL 98 97* 100  CO2 24 28 26   GLUCOSE 139* 109* 102*  BUN 28* 24* 28*  CREATININE 1.70* 1.83* 1.85*  CALCIUM 8.6* 9.1 9.0  GFRNONAA 44* 40* 40*  ANIONGAP 11 9 10      Hematology Recent Labs  Lab 01/28/21 0120 01/29/21 0252 01/30/21 0301  WBC 12.7* 14.0* 12.4*  RBC 4.69 4.85 4.99  HGB 13.6 13.9 14.3  HCT 41.6 43.6 44.1  MCV 88.7 89.9 88.4  MCH 29.0 28.7 28.7  MCHC 32.7 31.9 32.4  RDW 14.6 14.5 14.5  PLT 277 269 240    BNP Recent Labs  Lab 01/26/21 1431  BNP 419.9*     DDimer No results for input(s): DDIMER in the last 168 hours.   Radiology    No results found.  Cardiac Studies   TTE 09/14/20   1. Left ventricular ejection fraction, by estimation, is 45 to 50%. The  left ventricle has mildly decreased function. The left ventricle  demonstrates regional wall motion abnormalities (see scoring  diagram/findings for description).   2. Right ventricular systolic function is normal. The right ventricular  size is normal.   3. The mitral valve is normal in structure. Trivial mitral valve  regurgitation. No evidence of mitral stenosis.   4. The aortic valve is grossly normal. There is mild calcification of the  aortic valve. There is mild thickening of the aortic valve. Aortic valve  regurgitation is not visualized. No aortic stenosis is present.   Comparison(s): Prior images reviewed side by side.   Conclusion(s)/Recommendation(s): Llimited study for EF. Patient is now in  atrial fibrillation. The EF is mildly  reduced, with slight focal wall  motion abnormalities as noted. Prior images viewed side by side. The  pattern is very similar to prior, but  better seen with definity contrast on current study. Findings discussed  with Dr. Margaretann Loveless.   Cath 08/2020 Prox LAD lesion is 99% stenosed. Ost LAD to Prox LAD lesion is 100% stenosed. Mid LAD lesion is 80% stenosed. 1st Mrg lesion is 99% stenosed. Ost Cx to Prox Cx lesion is 80% stenosed. Mid Cx lesion is 100% stenosed. Balloon angioplasty was performed. Prox RCA to Dist RCA lesion is 100% stenosed with 100% stenosed side branch in RPAV. Ost Ramus lesion is 100% stenosed. SVG and is normal in caliber. Previously placed Prox Graft drug eluting stent is widely patent. LIMA and is large. The graft exhibits no disease. SVG and is normal in caliber. Origin to Prox Graft lesion is 100% stenosed. SVG and is large. Prox Graft to Dist Graft lesion is 100% stenosed. Dist Cx lesion is 95% stenosed. Mid LM to Dist LM lesion is 80% stenosed. Post intervention, there is a 20% residual stenosis.   Severe native coronary obstructive disease 80% distal left main stenosis, total occlusion of the LAD, ramus immediate vessel, high-grade circumflex stenoses, total occlusion of the proximal RCA.  There was a shunt to the distal RCA via left to right and right to right collateralization.   Patent LIMA graft supplying the mid LAD with diffuse disease in the LAD after insertion.   Supplying the OM 2 vessel with widely patent stent in the proximal portion of the SVG.  There is 95% in-stent restenosis in the native OM 2 vessel beyond the anastomosis.   Old occlusion of the vein graft which had supplied the ramus vessel.   Old occlusion of the vein graft which had supplied the PDA.   Difficult but ultimately successful intervention through the SVG to the distal OM 2 vessel requiring PTCA, [name] Cutting Balloon, and ultimate noncompliant balloon dilatation with a  95% stenosis being reduced to less than 20%.   LVEDP: 11 mmHg   RECOMMENDATION: DAPT probably indefinitely.  Since the patient developed early in-stent restenosis on aspirin/Plavix, Brilinta was utilized for more aggressive antiplatelet therapy.  Medical therapy for concomitant CAD.  Optimal blood pressure control, diabetes management, and aggressive lipid therapy with target LDL less than 70.  Patient Profile     65 y.o. male with a history of CAD s/p CABG and multiple subsequent PCIs, VF arrest s/p STJ ICD implanted 2012, chronic  combined CHF, obesity, HTN, DM, reported TIA/CVA, CKD stage IIIb, PAF on warfarin, DVT, possible ILD, abnormal CK/rheum markers 08/2020, transaminitis, diverticular disease, dyslipidemia, anxiety, DDD, PAD who presented with shortness of breath and cough found to have BNP 419 consistent with acute on chronic combined heart failure exacerbation.  Assessment & Plan    #Acute on Chronic Combined Systolic and Diastolic HF: Patient presented with worsening SOB and cough found to have BNP 400 with LE edema on exam consistent with acute on chronic combined systolic HF exacerbation. TTE on this admission consistent with prior with LVEF 40-45% with mulitple WMA as described above. While patient is overloaded, the degree of symptoms seem out-of-proportion to volume status. Possibly underlying ILD is contributing. Overall volume status improving with lasix. Would like to check CMR to rule out infiltrative process as well given underlying rheumatologic condition, multiple WMA, and ICD shocks, however, ICD device is not compatible with MRI  -Unable to perform CMR as ICD device not compatible -Continue lasix 40mg  IV BID; transition to PO tomorrow -Continue metop succinate 50mg  XL BID -Will add GDMT as tolerated (spiro, farxiga) -Monitor I/Os and daily weights -Low Na diet -Degree of symptoms/hypoxia out-of-proportion to HF; suspect possible ILD may be contributing--will need Pulm  f/u as out-patient as detailed below  #Multivessel CAD s/p CABG with multiple subsequent PCIs: Patient with history of CAD s/p CABG with multiple subsequent PCIs. Most recent admission on 08/2020 with NSTEMI found to have ISR s/p PTCA.  Has had a lot of confusion about antiplatelet and AC therapy post-cath. Was recommended for ticagrelor and warfarin but ultimately the patient did not tolerate the ticagrelor due to SOB and was switched back to plavix and warfarin per his PCP. Also was previously on repatha but this was stopped per patient preference and transitioned back to crestor. Overall, there appears to be a lot of confusion about his medication regimen and there is concern for noncompliance. Will need to have a very clear regimen going forward to help improve adherence. Fortunately, low suspicion for ACS as etiology of current presentation. Trop flat and no agnina.  -Plan for plavix 75mg  daily and warfarin going forward -Did not tolerate ticagrelor due to SOB and "panic attacks"--will defer antiplatelet plan to out-patient Cardiologist -Continue crestor 40mg  daily with goal LDL<70; would likely benefit from being back on repatha +/- zetia; can continue ongoing discussions with out-patient cardiologist -Continue metop 50mg  XL BID    #History of DVT (08/2020): On warfarin but INR 1.1. on admission and patient admits to missing doses. -Resume warfarin with heparin as bridge as detailed below -Management per pharmacy  #Paroxysmal Afib: CHADs-vasc 7. In NSR currently. Given high CHADs score and subtherapeutic INR, will plan for heparin gtt until INR at goal of 2-3. -Continue heparin gtt until INR therapeutic -Continue warfarin per pharmacy -Continue metop 50mg  XL BID  #VT with ICD shock: #History of VF arrest s/p ICD in 2012: Patient with recent shock on 6/27 in the setting of worsening volume status. Also with shock on 4/18 for which he was seen by EP. Low suspicion for ischemia, but there is  concern that possible infiltrative process may be contributing. Will pursue CMR (if able pending renal function) as above. -Management of HF as above -Continue metop 50mg  XL BID -Keep K>4, Mg>2  #ILD: #Positive double-stranded DNA: Management per Rheum and Pulm: -Continue pred and MMF -Will need close follow-up with Pulm and rheum as out-patient  #HTN: Stable and controlled. -Continue metop as above  #CKD  stage IIIB: -Monitor with diuresis      For questions or updates, please contact [place] Please consult [email or web address] for contact info under        Signed, Freada Bergeron, MD  01/30/2021, 9:38 AM

## 2021-01-29 NOTE — Progress Notes (Signed)
ANTICOAGULATION CONSULT NOTE - Follow Up Consult  Pharmacy Consult for Coumadin and Heparin bridge while INR subtherapeutic Indication: H/O DVT  No Known Allergies  Patient Measurements: Height: 5\' 5"  (165.1 cm) Weight: 76.2 kg (168 lb) IBW/kg (Calculated) : 61.5  Vital Signs: Temp: 97.8 F (36.6 C) (07/04 0355) Temp Source: Oral (07/04 0355) BP: 104/90 (07/04 0355) Pulse Rate: 53 (07/04 0355)  Labs: Recent Labs    01/26/21 1426 01/26/21 1728 01/27/21 0352 01/27/21 0352 01/27/21 1919 01/28/21 0120 01/28/21 0949 01/28/21 1830 01/29/21 0252  HGB 14.8  --  14.1  --   --  13.6  --   --  13.9  HCT 47.6  --  43.4  --   --  41.6  --   --  43.6  PLT 339  --  292  --   --  277  --   --  269  LABPROT 14.5  --  15.1  --   --  16.7*  --   --  17.6*  INR 1.1  --  1.2  --   --  1.4*  --   --  1.5*  HEPARINUNFRC  --   --   --    < >  --  0.30 0.27* 0.69 0.54  CREATININE 1.93*  --  1.68*  --  1.81* 1.70*  --   --  1.83*  TROPONINIHS 59* 59*  --   --   --   --   --   --   --    < > = values in this interval not displayed.     Estimated Creatinine Clearance: 38.4 mL/min (A) (by C-G formula based on SCr of 1.83 mg/dL (H)).   Medical History: Past Medical History:  Diagnosis Date   Abnormal nuclear stress test 02/19/2018   Angina, class III ([facility]) 02/19/2018   Anxiety    Chronic combined systolic and diastolic CHF (congestive heart failure) (HCC)    Chronic kidney disease, stage 3b (HCC)    Coronary artery disease    DDD (degenerative disc disease), cervical 03/01/2013   Diabetes mellitus    type 2   Diverticular disease 01/12/2014   mild   DVT (deep venous thrombosis) (H[facility])    Dyslipidemia    Hypertension    ICD (implantable cardioverter-defibrillator) in place    ILD (interstitial lung disease) ([place])    Lumbosacral radiculopathy at L4 02/18/2017   Myocardial infarction ([facility]) 2012   Obesity    Snoring 10/12/2013   TIA (transient ischemic attack) 08/09/2014   Tubular  adenoma of colon 01/12/2014   transverse colon   VF (ventricular fibrillation) ([place])    arrest   Vitamin D deficiency 02/18/2017    Medications:  No current facility-administered medications on file prior to encounter.   Current Outpatient Medications on File Prior to Encounter  Medication Sig Dispense Refill   clopidogrel (PLAVIX) 75 MG tablet Take 75 mg by mouth daily.     furosemide (LASIX) 40 MG tablet Take 40 mg by mouth daily.     insulin aspart (NOVOLOG) 100 UNIT/ML injection Substitute to any brand approved.Before each meal 3 times a day, 140-199 - 2 units, 200-250 - 4 units, 251-299 - 6 units,  300-349 - 8 units,  350 or above 10 units. Dispense syringes and needles as needed, Ok to switch to PEN if approved. DX DM2, Code E11.65 (Patient taking differently: Substitute to any brand approved.Before each meal 3 times a day, 140-199 - 2 units, 200-250 -  4 units, 251-299 - 6 units,  300-349 - 8 units,  350 or above 10 units. Dispense syringes and needles as needed, Ok to switch to PEN if approved. DX DM2, Code E11.65) 10 mL 0   insulin glargine (LANTUS) 100 UNIT/ML Solostar Pen Inject 60 Units into the skin daily before breakfast.     metoprolol succinate (TOPROL-XL) 50 MG 24 hr tablet Take 1 tablet (50 mg total) by mouth in the morning and at bedtime. Take with or immediately following a meal. 180 tablet 1   mycophenolate (CELLCEPT) 500 MG tablet Take 1,000 mg by mouth 2 (two) times daily.     nitroGLYCERIN (NITROSTAT) 0.4 MG SL tablet PLACE 1 TABLET UNDER THE TONGUE EVERY 5 MINUTES AS NEEDED FOR CHEST PAIN 25 tablet 0   OZEMPIC, 1 MG/DOSE, 4 MG/3ML SOPN Inject 1 mg into the skin once a week.     pantoprazole (PROTONIX) 40 MG tablet Take 40 mg by mouth daily.     predniSONE (DELTASONE) 5 MG tablet 8 Pills PO for 3 days,6 Pills PO for 3 days,4 Pills PO for 3 days,2 Pills PO for 3 days,1 Pills PO for 3 days,1/2 Pill  PO for 3 days then STOP. (Patient taking differently: Take 5 mg by mouth  daily with breakfast.) 65 tablet 0   ranolazine (RANEXA) 500 MG 12 hr tablet TAKE ONE TABLET BY MOUTH TWICE DAILY.PLEASE MAKE APPT WITH DR.HARDING FOR REFILLS.  (Patient taking differently: Take 500 mg by mouth daily.) 60 tablet 2   vitamin B-12 (CYANOCOBALAMIN) 1000 MCG tablet TAKE 1 TABLET (1,000 MCG TOTAL) BY MOUTH DAILY. 30 tablet 0   warfarin (COUMADIN) 5 MG tablet TAKE 1 TABLET (5 MG TOTAL) BY MOUTH DAILY AT 4 PM. (Patient taking differently: Take 2.5-5 mg by mouth See admin instructions. Take 1 tablet (5 mg) daily except on Sun Take 1/2 tablet (2.5 mg)) 7 tablet 0   Insulin Syringe-Needle U-100 25G X 1" 1 ML MISC For 4 times a day insulin SQ, 1 month supply. Diagnosis E11.65 30 each 0   rosuvastatin (CRESTOR) 40 MG tablet Take 1 tablet (40 mg total) by mouth daily. 90 tablet 3     Assessment: 65 y.o. male admitted with SOB/CHF. Patient is on warfarin prior to admission for DVT in February 2022 and AFib. Pharmacy consulted to dose warfarin. Patient reports missing 2-3 doses last week but does not routinely miss doses. Given high CHADS-VASC and questionable compliance, pharmacy consulted to bridge with heparin until INR therapeutic. INR 1.4  Prior to admission warfarin regimen: 5mg  daily except 2.5mg  on Sundays   Heparin level of 0.54 is therapeutic on heparin 1100 units/hr. Per RN no issues with IV access or infusion. No bleeding noted. Hgb 13.9. Plt wnl. INR 1.5 - increasing. All doses of warfarin administered as ordered.   Goal of Therapy:  INR 2-3 Monitor platelets by anticoagulation protocol: Yes   Plan:  Continue heparin 1100 units/hr  Monitor heparin level, CBC and s/s of bleeding daily Warfarin 7.5mg  x1  Monitor INR daily   Cristela Felt, PharmD Clinical Pharmacist 01/29/2021 7:40 AM

## 2021-01-29 NOTE — Progress Notes (Signed)
Occupational Therapy Treatment Patient Details Name: Eugene [name] MRN: [id number] DOB: [birth year] Today's Date: 01/29/2021    History of present illness Pt is a 65 y.o. male admitted 01/26/2021 with ongoing intermitten shortness of breath. CXR with diffuse reticular pattern bilaterally consistent with ILD with mild cardiomegaly. PMH includes CVA, CAD s/p CABG, CHF, CKD 3B, DDD, diabetes, GERD, hyperlipidemia, peripheral arterial disease, hypertension, long QT, V. fib status post AICD, ILD, sleep apnea, CAD (s/p CABG, PCI; vfib arrest s/p ICD), CKD IV, HTN, DM.   OT comments  Pt progressing well towards OT goals. Pt/wife able to verbalize energy conservation strategies to implement at home, as well as strategies to prevent CHF exacerbation. Pt currently requires supplemental O2 to maintain oxygen levels during ADLs/mobility. Educated on O2 line mgmt and consideration of pets in the home to maintain safety. Plan to further progress independence with O2 line mgmt during ADLs/IADLs to decrease fall risk.   Pt received on 1 L O2, 95% at rest. Trialed short mobility in room on RA with desats to 74%. With seated rest break, pt recovers to 84%. Pt required 1 L O2 to recover to 88% and 2 L O2 to recover to 93%. Pt endorses SOB, 2/4 DOE. Able to tolerate titration back to 1 L O2 at rest. HR WFL   Follow Up Recommendations  No OT follow up    Equipment Recommendations  None recommended by OT    Recommendations for Other Services      Precautions / Restrictions Precautions Precautions: Fall Precaution Comments: watch O2 Restrictions Weight Bearing Restrictions: No       Mobility Bed Mobility Overal bed mobility: Modified Independent Bed Mobility: Supine to Sit;Sit to Supine                Transfers Overall transfer level: Independent Equipment used: None Transfers: Sit to/from Stand Sit to Stand: Independent              Balance Overall balance assessment: No apparent balance  deficits (not formally assessed)                                         ADL either performed or assessed with clinical judgement   ADL Overall ADL's : Needs assistance/impaired                     Lower Body Dressing: Modified independent;Sit to/from stand Lower Body Dressing Details (indicate cue type and reason): able to demo ability to form figure four position to adjust socks though some difficulty. educated on propping LE up on bed to assist with task. Encouraged bringing feet to self for energy conservation             Functional mobility during ADLs: Modified independent General ADL Comments: Reinforced energy conservation strategies and techniques to prevent CHF exacerbation with pt/wife verbalizing understanding. Wife is a good support system to remind pt of strategies to use. Discussed O2 line mgmt with demonstration provided and plan to assess in next session. Pt received on 1 L O2 so trialed on RA with significant desats so advised pt/family that home O2 will likely be needed on DC     Vision   Vision Assessment?: No apparent visual deficits   Perception     Praxis      Cognition Arousal/Alertness: Awake/alert Behavior During Therapy: WFL for tasks assessed/performed Overall Cognitive Status:  Within Functional Limits for tasks assessed                                          Exercises     Shoulder Instructions       General Comments Significant desats with RA trial (received on 1 L O2). Reinforced seated rest breaks vs standing rest breaks and pursed lip breathing.    Pertinent Vitals/ Pain       Pain Assessment: No/denies pain  Home Living                                          Prior Functioning/Environment              Frequency  Min 2X/week        Progress Toward Goals  OT Goals(current goals can now be found in the care plan section)  Progress towards OT goals: Progressing  toward goals  Acute Rehab OT Goals Patient Stated Goal: to be able to mow the yard again OT Goal Formulation: With patient Time For Goal Achievement: 02/10/21 Potential to Achieve Goals: Good ADL Goals Pt Will Perform Upper Body Dressing: with modified independence;sitting Pt Will Perform Lower Body Dressing: with modified independence;sit to/from stand Pt Will Transfer to Toilet: with modified independence;ambulating;regular height toilet  Plan Discharge plan remains appropriate    Co-evaluation                 AM-PAC OT "6 Clicks" Daily Activity     Outcome Measure   Help from another person eating meals?: None Help from another person taking care of personal grooming?: None Help from another person toileting, which includes using toliet, bedpan, or urinal?: None Help from another person bathing (including washing, rinsing, drying)?: A Little Help from another person to put on and taking off regular upper body clothing?: None Help from another person to put on and taking off regular lower body clothing?: None 6 Click Score: 23    End of Session Equipment Utilized During Treatment: Oxygen;Gait belt  OT Visit Diagnosis: Unsteadiness on feet (R26.81);Muscle weakness (generalized) (M62.81)   Activity Tolerance Patient tolerated treatment well   Patient Left in bed;with call bell/phone within reach;with family/visitor present   Nurse Communication Mobility status        Time: 8502-7741 OT Time Calculation (min): 27 min  Charges: OT General Charges $OT Visit: 1 Visit OT Treatments $Self Care/Home Management : 8-22 mins $Therapeutic Activity: 8-22 mins  Malachy Chamber, OTR/L Acute Rehab Services Office: [phone]    Layla Maw 01/29/2021, 11:56 AM

## 2021-01-29 NOTE — Progress Notes (Signed)
Progress Note  Patient Name: Eugene [name] Date of Encounter: 01/29/2021  Primary Cardiologist: Werner Lean, MD   Subjective   No events since last cardiology assessment 01/28/21.  Patient notes improvement in SOB; notes no issues with leg strength since restarting his rosuvastatin (this predates the admission).  No CP.    Inpatient Medications    Scheduled Meds:  clopidogrel  75 mg Oral Daily   furosemide  40 mg Intravenous BID   guaiFENesin  600 mg Oral BID   guaiFENesin-dextromethorphan  10 mL Oral Q6H   insulin aspart  0-15 Units Subcutaneous TID WC   insulin aspart  0-5 Units Subcutaneous QHS   insulin glargine  30 Units Subcutaneous Daily   metoprolol succinate  50 mg Oral BID   mycophenolate  1,000 mg Oral BID   pantoprazole  40 mg Oral Daily   polyethylene glycol  17 g Oral Daily   predniSONE  5 mg Oral Q breakfast   ranolazine  500 mg Oral Daily   rosuvastatin  40 mg Oral Daily   sodium chloride flush  3 mL Intravenous Q12H   warfarin  7.5 mg Oral ONCE-1600   Warfarin - Pharmacist Dosing Inpatient   Does not apply q1600   Continuous Infusions:  heparin 1,100 Units/hr (01/28/21 1956)   PRN Meds: acetaminophen **OR** acetaminophen   Vital Signs    Vitals:   01/28/21 1100 01/28/21 1811 01/28/21 1945 01/29/21 0355  BP: 123/77 111/72 101/68 104/90  Pulse: 81 77 73 (!) 53  Resp: 19 18 18 18   Temp: 98.1 F (36.7 C) 97.9 F (36.6 C) 97.9 F (36.6 C) 97.8 F (36.6 C)  TempSrc: Oral Oral Oral Oral  SpO2: 97% 100% 95% 98%  Weight:    76.2 kg  Height:        Intake/Output Summary (Last 24 hours) at 01/29/2021 0926 Last data filed at 01/28/2021 2102 Gross per 24 hour  Intake 960 ml  Output 375 ml  Net 585 ml   Filed Weights   01/27/21 0224 01/28/21 0441 01/29/21 0355  Weight: 78.7 kg 77 kg 76.2 kg    Telemetry    NSR no ectopy - Personally Reviewed  ECG    None since 01/26/21 (SR 75 Septal infarct) - Personally Reviewed  Physical Exam    GEN: No acute distress.   Neck: No JVD; small clavicular cyst under right collarbone Cardiac: RRR, no murmurs, rubs, or gallops.  Respiratory: Clear to auscultation bilaterally but with poor air movement GI: Soft, nontender, non-distended  MS: Trace non pitting edema; No deformity. Neuro:  Nonfocal  Psych: Normal affect   Labs    Chemistry Recent Labs  Lab 01/27/21 1919 01/28/21 0120 01/29/21 0252  NA 135 133* 134*  K 4.4 4.3 4.2  CL 98 98 97*  CO2 28 24 28   GLUCOSE 127* 139* 109*  BUN 27* 28* 24*  CREATININE 1.81* 1.70* 1.83*  CALCIUM 9.0 8.6* 9.1  GFRNONAA 41* 44* 40*  ANIONGAP 9 11 9      Hematology Recent Labs  Lab 01/27/21 0352 01/28/21 0120 01/29/21 0252  WBC 14.9* 12.7* 14.0*  RBC 4.87 4.69 4.85  HGB 14.1 13.6 13.9  HCT 43.4 41.6 43.6  MCV 89.1 88.7 89.9  MCH 29.0 29.0 28.7  MCHC 32.5 32.7 31.9  RDW 14.6 14.6 14.5  PLT 292 277 269    Cardiac EnzymesNo results for input(s): TROPONINI in the last 168 hours. No results for input(s): TROPIPOC in the last 168  hours.   BNP Recent Labs  Lab 01/26/21 1431  BNP 419.9*     DDimer No results for input(s): DDIMER in the last 168 hours.   Radiology    ECHOCARDIOGRAM COMPLETE  Result Date: 01/27/2021    ECHOCARDIOGRAM REPORT   Patient Name:   Eugene [name] [facility] Date of Exam: 01/27/2021 Medical Rec #:  [id number]    Height:       65.0 in Accession #:    [id number]   Weight:       173.5 lb Date of Birth:  1956-01-29   BSA:          1.862 m Patient Age:    55 years     BP:           96/64 mmHg Patient Gender: M            HR:           76 bpm. Exam Location:  Inpatient Procedure: 2D Echo, Color Doppler, Cardiac Doppler and Intracardiac            Opacification Agent Indications:    X32.44 Acute systolic (congestive) heart failure  History:        Patient has prior history of Echocardiogram examinations, most                 recent 09/14/2020. CHF, CAD, Defibrillator; Risk                 Factors:Hypertension, Diabetes,  Dyslipidemia, Sleep Apnea and                 COVID+ 08/2020.  Sonographer:    Casey Referring Phys: [phone] AMRIT ADHIKARI IMPRESSIONS  1. Left ventricular ejection fraction, by estimation, is 45 to 50%. The left ventricle has mildly decreased function. The left ventricle demonstrates regional wall motion abnormalities.     There is hypokinesis of the basal-to-mid anteroseptal, basal-to-mid anterior, basal-to-mid inferior and basal inferoseptal LV walls. Left ventricular diastolic parameters are consistent with Grade I diastolic dysfunction (impaired relaxation).  2. Right ventricular systolic function is mildly reduced. The right ventricular size is not well visualized.  3. The mitral valve is grossly normal. Trivial mitral valve regurgitation. No evidence of mitral stenosis.  4. The aortic valve is tricuspid. There is mild calcification of the aortic valve. There is moderate thickening of the aortic valve. Aortic valve regurgitation is not visualized. Mild to moderate aortic valve sclerosis/calcification is present, without any evidence of aortic stenosis. Comparison(s): Compared to prior limited TTE in 09/14/20, there is no significant change. There continues to be WMA as described above. FINDINGS  Left Ventricle: Left ventricular ejection fraction, by estimation, is 45 to 50%. The left ventricle has mildly decreased function. The left ventricle demonstrates regional wall motion abnormalities. Definity contrast agent was given IV to delineate the left ventricular endocardial borders. There is hypokinesis of the basal-to-mid anteroseptal, basal-to-mid anterior, basal-to-mid inferior and basal inferoseptal LV walls. The left ventricular internal cavity size was normal in size. There is no left ventricular hypertrophy. Left ventricular diastolic parameters are consistent with Grade I diastolic dysfunction (impaired relaxation). Right Ventricle: The right ventricular size is not well visualized. Right  vetricular wall thickness was not well visualized. Right ventricular systolic function is mildly reduced. Left Atrium: Left atrial size was normal in size. Right Atrium: Right atrial size was normal in size. Pericardium: There is no evidence of pericardial effusion. Mitral Valve: The mitral valve is grossly normal. There is mild  thickening of the mitral valve leaflet(s). There is mild calcification of the mitral valve leaflet(s). Mild to moderate mitral annular calcification. Trivial mitral valve regurgitation. No evidence of mitral valve stenosis. Tricuspid Valve: The tricuspid valve is normal in structure. Tricuspid valve regurgitation is trivial. Aortic Valve: The aortic valve is tricuspid. There is mild calcification of the aortic valve. There is moderate thickening of the aortic valve. Aortic valve regurgitation is not visualized. Mild to moderate aortic valve sclerosis/calcification is present, without any evidence of aortic stenosis. Pulmonic Valve: The pulmonic valve was not well visualized. Pulmonic valve regurgitation is trivial. Aorta: The aortic root and ascending aorta are structurally normal, with no evidence of dilitation. IAS/Shunts: No atrial level shunt detected by color flow Doppler. Additional Comments: A device lead is visualized.  LEFT VENTRICLE PLAX 2D LVIDd:         4.20 cm  Diastology LVIDs:         3.50 cm  LV e' medial:    3.90 cm/s LV PW:         0.90 cm  LV E/e' medial:  18.9 LV IVS:        0.80 cm  LV e' lateral:   4.95 cm/s LVOT diam:     2.00 cm  LV E/e' lateral: 14.9 LV SV:         39 LV SV Index:   21 LVOT Area:     3.14 cm  RIGHT VENTRICLE RV S prime:     5.78 cm/s TAPSE (M-mode): 1.7 cm LEFT ATRIUM             Index       RIGHT ATRIUM           Index LA diam:        3.50 cm 1.88 cm/m  RA Area:     19.30 cm LA Vol (A2C):   28.2 ml 15.14 ml/m RA Volume:   53.00 ml  28.46 ml/m LA Vol (A4C):   46.7 ml 25.08 ml/m LA Biplane Vol: 35.9 ml 19.28 ml/m  AORTIC VALVE LVOT Vmax:    67.90 cm/s LVOT Vmean:  47.800 cm/s LVOT VTI:    0.124 m  AORTA Ao Root diam: 3.40 cm Ao Asc diam:  3.00 cm MITRAL VALVE MV Area (PHT): 2.83 cm     SHUNTS MV Decel Time: 268 msec     Systemic VTI:  0.12 m MV E velocity: 73.90 cm/s   Systemic Diam: 2.00 cm MV A velocity: 102.00 cm/s MV E/A ratio:  0.72 Gwyndolyn Kaufman MD Electronically signed by Gwyndolyn Kaufman MD Signature Date/Time: 01/27/2021/3:32:43 PM    Final     Cardiac Studies   Transthoracic Echocardiogram: Date: 01/27/2021 Results: 1. Left ventricular ejection fraction, by estimation, is 45 to 50%. The  left ventricle has mildly decreased function. The left ventricle  demonstrates regional wall motion abnormalities.      There is hypokinesis of the basal-to-mid anteroseptal, basal-to-mid  anterior, basal-to-mid inferior and basal inferoseptal LV walls. Left  ventricular diastolic parameters are consistent with Grade I diastolic  dysfunction (impaired relaxation).   2. Right ventricular systolic function is mildly reduced. The right  ventricular size is not well visualized.   3. The mitral valve is grossly normal. Trivial mitral valve  regurgitation. No evidence of mitral stenosis.   4. The aortic valve is tricuspid. There is mild calcification of the  aortic valve. There is moderate thickening of the aortic valve. Aortic  valve regurgitation is not visualized.  Mild to moderate aortic valve  sclerosis/calcification is present, without  any evidence of aortic stenosis.   NonCardiac CT: Date: 09/09/20 Results: IMPRESSION: 1. 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 Suggest outpatient pulmonology consultation and follow-up high-resolution chest CT study in 6-12 months. Findings are categorized as probable UIP per consensus guidelines: Diagnosis of Idiopathic Pulmonary Fibrosis: An Official ATS/ERS/JRS/ALAT Clinical Practice Guideline. [place], Iss 5, [phone], Mar 29 2017. 2. Solid 5 mm right middle lobe pulmonary nodule along the minor fissure. Follow-up noncontrast chest CT recommended in 12 months in this high risk patient. This recommendation follows the consensus statement: Guidelines for Management of Incidental Pulmonary Nodules Detected on CT Images: From the Fleischner Society 2017; Radiology 2017; 284:228-243. 3. Cholelithiasis. 4. Aortic Atherosclerosis (ICD10-I70.0) and Emphysema (ICD10-J43.9).  Left/Right Heart Catheterizations: Date: 09/12/20 Results: Prox LAD lesion is 99% stenosed. Ost LAD to Prox LAD lesion is 100% stenosed. Mid LAD lesion is 80% stenosed. 1st Mrg lesion is 99% stenosed. Ost Cx to Prox Cx lesion is 80% stenosed. Mid Cx lesion is 100% stenosed. Balloon angioplasty was performed. Prox RCA to Dist RCA lesion is 100% stenosed with 100% stenosed side branch in RPAV. Ost Ramus lesion is 100% stenosed. SVG and is normal in caliber. Previously placed Prox Graft drug eluting stent is widely patent. LIMA and is large. The graft exhibits no disease. SVG and is normal in caliber. Origin to Prox Graft lesion is 100% stenosed. SVG and is large. Prox Graft to Dist Graft lesion is 100% stenosed. Dist Cx lesion is 95% stenosed. Mid LM to Dist LM lesion is 80% stenosed. Post intervention, there is a 20% residual stenosis.   Severe native coronary obstructive disease 80% distal left main stenosis, total occlusion of the LAD, ramus immediate vessel, high-grade circumflex stenoses, total occlusion of the proximal RCA.  There was a shunt to the distal RCA via left to right and right to right collateralization.   Patent LIMA graft supplying the mid LAD with diffuse disease in the LAD after insertion.   Supplying the OM 2 vessel with widely patent stent in the proximal portion of the SVG.  There is 95% in-stent restenosis in the native OM 2 vessel beyond the anastomosis.   Old  occlusion of the vein graft which had supplied the ramus vessel.   Old occlusion of the vein graft which had supplied the PDA.   Difficult but ultimately successful intervention through the SVG to the distal OM 2 vessel requiring PTCA, [name] Cutting Balloon, and ultimate noncompliant balloon dilatation with a 95% stenosis being reduced to less than 20%.   LVEDP: 11 mmHg   RECOMMENDATION: DAPT probably indefinitely.  Since the patient developed early in-stent restenosis on aspirin/Plavix, Brilinta was utilized for more aggressive antiplatelet therapy.  Medical therapy for concomitant CAD.  Optimal blood pressure control, diabetes management, and aggressive lipid therapy with target LDL less than 70.   Patient Profile     65 y.o. male Signficant CAD, AF prior DVT, and recent ICD shocks presents for evaluation 01/26/21  Assessment & Plan    #Acute on Chronic Combined Systolic and Diastolic HF: #CKD stage IIIB: #HTN: - improving; agree with previous colleagues that breathlessness is out of proportion to physical exam -Continue lasix 40mg  IV BID -Continue metop succinate 50mg  XL BID -Monitor I/Os and daily weights -Low Na diet - at this time will defer CMR; this can be considered in the  setting of inflammatory disease and VT; given patient's ICD there are concerns about artifact and this most information would be LGE related. Because of this, would defer at least until PO Transition - low threshold to perform RHC if discordance between kidney, symptomatic improvement, and volume status (procedure not planned at this point)  #Multivessel CAD s/p CABG with multiple subsequent PCIs #History of DVT (08/2020); peri-COVID-19 admission -Plan for plavix 75mg  daily and warfarin going forward (presently on heparin -Did not tolerate ticagrelor due to SOB and "panic attacks" -Continue crestor 40mg  daily (no further leg weakness) with goal LDL<70; - low threshold to start zetia +/- restart repatha  from prior -Continue metop 50mg  XL BID- warfarin transition started   #Paroxysmal Afib: CHADs-vasc 7. In NSR currently. -Continue heparin gtt until INR therapeutic -Continue warfarin -Continue metop 50mg  XL BID   #VT with ICD shock: #History of VF arrest s/p ICD in 2012: - Has MDT Device -low threshold for CMR; will continue to work toward euvolemia - has  -Continue metop 50mg  XL BID -Keep K>4, Mg>2   #ILD: #Positive double-stranded DNA: Management per Rheum and Pulm: -Continue pred and MMF -Consider pulm consult vs close follow-up as out-patient    Discussed with patient:  goal of care is to be able to walk around with his wife and do basic ADLs; has retired from [employer] because of debility  For questions or updates, please contact [place] Please consult [email or web address] for contact info under Cardiology/STEMI.      Signed, Werner Lean, MD  01/29/2021, 9:26 AM

## 2021-01-30 LAB — CBC WITH DIFFERENTIAL/PLATELET
Abs Immature Granulocytes: 0.15 10*3/uL — ABNORMAL HIGH (ref 0.00–0.07)
Basophils Absolute: 0.1 10*3/uL (ref 0.0–0.1)
Basophils Relative: 1 %
Eosinophils Absolute: 0.4 10*3/uL (ref 0.0–0.5)
Eosinophils Relative: 3 %
HCT: 44.1 % (ref 39.0–52.0)
Hemoglobin: 14.3 g/dL (ref 13.0–17.0)
Immature Granulocytes: 1 %
Lymphocytes Relative: 17 %
Lymphs Abs: 2.2 10*3/uL (ref 0.7–4.0)
MCH: 28.7 pg (ref 26.0–34.0)
MCHC: 32.4 g/dL (ref 30.0–36.0)
MCV: 88.4 fL (ref 80.0–100.0)
Monocytes Absolute: 1 10*3/uL (ref 0.1–1.0)
Monocytes Relative: 8 %
Neutro Abs: 8.7 10*3/uL — ABNORMAL HIGH (ref 1.7–7.7)
Neutrophils Relative %: 70 %
Platelets: 240 10*3/uL (ref 150–400)
RBC: 4.99 MIL/uL (ref 4.22–5.81)
RDW: 14.5 % (ref 11.5–15.5)
WBC: 12.4 10*3/uL — ABNORMAL HIGH (ref 4.0–10.5)
nRBC: 0 % (ref 0.0–0.2)

## 2021-01-30 LAB — GLUCOSE, CAPILLARY
Glucose-Capillary: 118 mg/dL — ABNORMAL HIGH (ref 70–99)
Glucose-Capillary: 374 mg/dL — ABNORMAL HIGH (ref 70–99)
Glucose-Capillary: 230 mg/dL — ABNORMAL HIGH (ref 70–99)
Glucose-Capillary: 183 mg/dL — ABNORMAL HIGH (ref 70–99)

## 2021-01-30 LAB — BASIC METABOLIC PANEL
Anion gap: 10 (ref 5–15)
BUN: 28 mg/dL — ABNORMAL HIGH (ref 8–23)
CO2: 26 mmol/L (ref 22–32)
Calcium: 9 mg/dL (ref 8.9–10.3)
Chloride: 100 mmol/L (ref 98–111)
Creatinine, Ser: 1.85 mg/dL — ABNORMAL HIGH (ref 0.61–1.24)
GFR, Estimated: 40 mL/min — ABNORMAL LOW (ref >60)
Glucose, Bld: 102 mg/dL — ABNORMAL HIGH (ref 70–99)
Potassium: 3.4 mmol/L — ABNORMAL LOW (ref 3.5–5.1)
Sodium: 136 mmol/L (ref 135–145)

## 2021-01-30 LAB — PROTIME-INR
INR: 1.5 — ABNORMAL HIGH (ref 0.8–1.2)
Prothrombin Time: 18 seconds — ABNORMAL HIGH (ref 11.4–15.2)

## 2021-01-30 LAB — HEPARIN LEVEL (UNFRACTIONATED)
Heparin Unfractionated: <0.1 IU/mL — ABNORMAL LOW (ref 0.30–0.70)
Heparin Unfractionated: <0.1 IU/mL — ABNORMAL LOW (ref 0.30–0.70)
Heparin Unfractionated: 0.12 IU/mL — ABNORMAL LOW (ref 0.30–0.70)

## 2021-01-30 MED ORDER — FUROSEMIDE 40 MG PO TABS
40.0000 mg | ORAL_TABLET | Freq: Two times a day (BID) | ORAL | Status: DC
  Administered 2021-01-31: 40 mg via ORAL
  Filled 2021-01-30: qty 1

## 2021-01-30 MED ORDER — WARFARIN SODIUM 7.5 MG PO TABS
7.5000 mg | ORAL_TABLET | Freq: Once | ORAL | Status: AC
  Administered 2021-01-30: 7.5 mg via ORAL
  Filled 2021-01-30: qty 1

## 2021-01-30 MED ORDER — POTASSIUM CHLORIDE CRYS ER 20 MEQ PO TBCR
40.0000 meq | EXTENDED_RELEASE_TABLET | Freq: Once | ORAL | Status: AC
  Administered 2021-01-30: 40 meq via ORAL
  Filled 2021-01-30: qty 2

## 2021-01-30 NOTE — Progress Notes (Signed)
PROGRESS NOTE    Eugene [name]  XIP:[id number] DOB: April 12, 1956 DOA: 01/26/2021 PCP: [name], MD   Chief Complain: Shortness of breath  Brief Narrative: Patient is a 65 year old male with history of CVA, coronary artery artery disease status post CABG, systolic CHF, CKD stage III, degenerative disc disease, cystic fibrosis, diabetes type 2, GERD, hyperlipidemia, peripheral artery disease, hypertension ,prolonged QT, V. fib status post AICD placement, interstitial lung disease, sleep apnea, anxiety who presented with shortness of breath, cough, increased bilateral lower extremity edema.  Patient also reported that his AICD had fired twice recently.  He follows with Dr. Lovena Le, [place].  On presentation he was hypoxic on room air requiring supplemental oxygen, he does not use oxygen at home.  CBC showed leukocytosis, he had elevated BNP at 419.  Chest x-ray showed diffuse reticular pattern consistent with ILD, cardiomegaly.  Patient was admitted for the management of acute exacerbation of CHF, started on IV Lasix.  Cardiology also consulted and following.  Assessment & Plan:   Principal Problem:   Acute exacerbation of CHF (congestive heart failure) (HCC) Active Problems:   Hyperlipidemia   HYPERTENSION, BENIGN   CAD, NATIVE VESSEL   VENTRICULAR FIBRILLATION   Single implantable cardioverter-defibrillator (ICD) in situ   PAD (peripheral artery disease) ([place])   Coronary artery disease involving coronary bypass graft of native heart with angina pectoris (HCC)   History of CVA (cerebrovascular accident)   Gastroesophageal reflux disease without esophagitis   Chronic kidney disease, stage 3b (HCC)   ILD (interstitial lung disease) ([place])  Acute on chronic combined  systolic/diastolic congestive heart failure: Presented with cough, shortness of breath, bilateral lower extremity swelling.  Hypoxic on presentation.  Takes Lasix 60 mg once a day at home. Patient was found to be volume overloaded on  presentation.  Elevated BNP.  Started on IV Lasix.  Continue to monitor input and output, daily weight. Echo done on this admission showed ejection fraction of 45 to 50%, regional wall motion abnormality in the left ventricle, grade 1 diastolic dysfunction cardiology also consulted and following.  We will continue IV diuresis today with plan for transitioning to oral tomorrow  History of V. fib status post AICD: He reports that his AICD fired twice, last firing was last Friday(6/24).  Cardiology following here.  Acute hypoxic respiratory failure: Most likely secondary to combination of CHF exacerbation, interstitial lung disease.  He does not use oxygen at home.  Was requiring 3 to 4 L of oxygen per minute,now on 1 L at rest.  He feels better symptomatically.  .Chest x-ray showed diffuse  reticular pattern consistent with ILD, cardiomegaly. He follows with Dr Chase Caller as an outpatient.  He will qualify for home oxygen at 2 L/min.  He needs to follow-up with Dr. Gerome Apley as an outpatient as soon as possible.  History of coronary artery disease: Status post CABG.  Denies chest pain.  Minimally elevated flat trended troponin.  Continue home metoprolol, Plavix, aspirin  History of CVA/peripheral artery disease, hyperlipidemia: On Crestor and Plavix at home.  History of ILD: Follows with rheumatology.  Takes prednisone, mycophenolate.  Has positive double-stranded DNA.  We recommend to follow-up with rheumatology as outpatient.I talked to his rheumatologist Dr Kathlene November on 01/29/21.  Leukocytosis: Chronic, most likely associated with prednisone therapy.  Continue to monitor.  Diabetes type 2: Monitor blood sugars.  Continue Lantus and sliding scale insulin.  CKD stage IIIb: Currently kidney function at baseline.  Continue to monitor while on diuresis.  History of  DVT: On warfarin at home, reported that he missed few doses.  Currently on heparin drip and warfarin and will continue until INR is  therapeutic.  Paroxysmal A. fib: Continue anticoagulation.  On metoprolol.  Currently rate is controlled.  And on sinus rhythm  Debility/deconditioning: PT/OT consulted,HH recommended           DVT prophylaxis:Warfarin/heparin Code Status: Full Family Communication: None at the bedside Status is: Inpatient   Dispo: The patient is from: Home              Anticipated d/c is to: Home              Patient currently is not medically stable to d/c.   Difficult to place patient No      Consultants: cardiology  Procedures:None  Antimicrobials:  Anti-infectives (From admission, onward)    None       Subjective: Patient seen and examined at the bedside this morning.  Hemodynamically stable.  Lying in bed.  Denies any worsening shortness of breath or cough or chest pain.  On 1 L of oxygen at rest.  Objective: Vitals:   01/29/21 1205 01/29/21 1929 01/29/21 2130 01/30/21 0413  BP: 115/80 108/65 114/74 131/74  Pulse: 72 70 72 64  Resp: 20 19  18   Temp: 98.4 F (36.9 C) 97.6 F (36.4 C)  98 F (36.7 C)  TempSrc: Oral Oral  Oral  SpO2: 95% 94%  94%  Weight:    75.6 kg  Height:        Intake/Output Summary (Last 24 hours) at 01/30/2021 0745 Last data filed at 01/29/2021 2200 Gross per 24 hour  Intake --  Output 400 ml  Net -400 ml   Filed Weights   01/28/21 0441 01/29/21 0355 01/30/21 0413  Weight: 77 kg 76.2 kg 75.6 kg    Examination: General exam: Overall comfortable, not in distress, deconditioned HEENT: PERRL Respiratory system:  no wheezes or crackles  Cardiovascular system: S1 & S2 heard, RRR.  Gastrointestinal system: Abdomen is nondistended, soft and nontender. Central nervous system: Alert and oriented Extremities: No edema, no clubbing ,no cyanosis Skin: No rashes, no ulcers,no icterus     Data Reviewed: I have personally reviewed following labs and imaging studies  CBC: Recent Labs  Lab 01/26/21 1426 01/27/21 0352 01/28/21 0120  01/29/21 0252 01/30/21 0301  WBC 18.3* 14.9* 12.7* 14.0* 12.4*  NEUTROABS  --   --  8.9* 9.9* 8.7*  HGB 14.8 14.1 13.6 13.9 14.3  HCT 47.6 43.4 41.6 43.6 44.1  MCV 92.6 89.1 88.7 89.9 88.4  PLT 339 292 277 269 262   Basic Metabolic Panel: Recent Labs  Lab 01/27/21 0352 01/27/21 1919 01/28/21 0120 01/29/21 0252 01/30/21 0301  NA 137 135 133* 134* 136  K 3.4* 4.4 4.3 4.2 3.4*  CL 102 98 98 97* 100  CO2 24 28 24 28 26   GLUCOSE 176* 127* 139* 109* 102*  BUN 25* 27* 28* 24* 28*  CREATININE 1.68* 1.81* 1.70* 1.83* 1.85*  CALCIUM 8.9 9.0 8.6* 9.1 9.0  MG 2.2  --   --   --   --    GFR: Estimated Creatinine Clearance: 37.8 mL/min (A) (by C-G formula based on SCr of 1.85 mg/dL (H)). Liver Function Tests: No results for input(s): AST, ALT, ALKPHOS, BILITOT, PROT, ALBUMIN in the last 168 hours. No results for input(s): LIPASE, AMYLASE in the last 168 hours. No results for input(s): AMMONIA in the last 168 hours. Coagulation Profile: Recent  Labs  Lab 01/26/21 1426 01/27/21 0352 01/28/21 0120 01/29/21 0252 01/30/21 0301  INR 1.1 1.2 1.4* 1.5* 1.5*   Cardiac Enzymes: No results for input(s): CKTOTAL, CKMB, CKMBINDEX, TROPONINI in the last 168 hours. BNP (last 3 results) Recent Labs    07/26/20 1033 08/04/20 1127  PROBNP 2,139* 2,329*   HbA1C: No results for input(s): HGBA1C in the last 72 hours. CBG: Recent Labs  Lab 01/29/21 0550 01/29/21 1207 01/29/21 1627 01/29/21 2103 01/30/21 0602  GLUCAP 122* 134* 144* 148* 118*   Lipid Profile: No results for input(s): CHOL, HDL, LDLCALC, TRIG, CHOLHDL, LDLDIRECT in the last 72 hours. Thyroid Function Tests: No results for input(s): TSH, T4TOTAL, FREET4, T3FREE, THYROIDAB in the last 72 hours. Anemia Panel: No results for input(s): VITAMINB12, FOLATE, FERRITIN, TIBC, IRON, RETICCTPCT in the last 72 hours. Sepsis Labs: No results for input(s): PROCALCITON, LATICACIDVEN in the last 168 hours.  Recent Results (from the  past 240 hour(s))  Resp Panel by RT-PCR (Flu A&B, Covid) Nasopharyngeal Swab     Status: None   Collection Time: 01/26/21 11:14 PM   Specimen: Nasopharyngeal Swab; Nasopharyngeal(NP) swabs in vial transport medium  Result Value Ref Range Status   SARS Coronavirus 2 by RT PCR NEGATIVE NEGATIVE Final    Comment: (NOTE) SARS-CoV-2 target nucleic acids are NOT DETECTED.  The SARS-CoV-2 RNA is generally detectable in upper respiratory specimens during the acute phase of infection. The lowest concentration of SARS-CoV-2 viral copies this assay can detect is 138 copies/mL. A negative result does not preclude SARS-Cov-2 infection and should not be used as the sole basis for treatment or other patient management decisions. A negative result may occur with  improper specimen collection/handling, submission of specimen other than nasopharyngeal swab, presence of viral mutation(s) within the areas targeted by this assay, and inadequate number of viral copies(<138 copies/mL). A negative result must be combined with clinical observations, patient history, and epidemiological information. The expected result is Negative.  Fact Sheet for Patients:  EntrepreneurPulse.com.au  Fact Sheet for Healthcare Providers:  IncredibleEmployment.be  This test is no t yet approved or cleared by the [place] FDA and  has been authorized for detection and/or diagnosis of SARS-CoV-2 by FDA under an Emergency Use Authorization (EUA). This EUA will remain  in effect (meaning this test can be used) for the duration of the COVID-19 declaration under Section 564(b)(1) of the Act, 21 U.S.C.section 360bbb-3(b)(1), unless the authorization is terminated  or revoked sooner.       Influenza A by PCR NEGATIVE NEGATIVE Final   Influenza B by PCR NEGATIVE NEGATIVE Final    Comment: (NOTE) The Xpert Xpress SARS-CoV-2/FLU/RSV plus assay is intended as an aid in the diagnosis of  influenza from Nasopharyngeal swab specimens and should not be used as a sole basis for treatment. Nasal washings and aspirates are unacceptable for Xpert Xpress SARS-CoV-2/FLU/RSV testing.  Fact Sheet for Patients: EntrepreneurPulse.com.au  Fact Sheet for Healthcare Providers: IncredibleEmployment.be  This test is not yet approved or cleared by the [place] FDA and has been authorized for detection and/or diagnosis of SARS-CoV-2 by FDA under an Emergency Use Authorization (EUA). This EUA will remain in effect (meaning this test can be used) for the duration of the COVID-19 declaration under Section 564(b)(1) of the Act, 21 U.S.C. section 360bbb-3(b)(1), unless the authorization is terminated or revoked.  Performed at [facility], [place] [address] [place], [place] [address]          Radiology Studies: No  results found.      Scheduled Meds:  clopidogrel  75 mg Oral Daily   furosemide  40 mg Intravenous BID   guaiFENesin  600 mg Oral BID   guaiFENesin-dextromethorphan  10 mL Oral Q6H   insulin aspart  0-15 Units Subcutaneous TID WC   insulin aspart  0-5 Units Subcutaneous QHS   insulin glargine  30 Units Subcutaneous Daily   metoprolol succinate  50 mg Oral BID   mycophenolate  1,000 mg Oral BID   pantoprazole  40 mg Oral Daily   polyethylene glycol  17 g Oral Daily   potassium chloride  40 mEq Oral Once   predniSONE  5 mg Oral Q breakfast   ranolazine  500 mg Oral Daily   rosuvastatin  40 mg Oral Daily   sodium chloride flush  3 mL Intravenous Q12H   Warfarin - Pharmacist Dosing Inpatient   Does not apply q1600   Continuous Infusions:  heparin 1,100 Units/hr (01/29/21 2133)     LOS: 3 days    Time spent: 25 mins,More than 50% of that time was spent in counseling and/or coordination of care.      Shelly Coss, MD [facility] P7/11/2020, 7:45 AM

## 2021-01-30 NOTE — Progress Notes (Signed)
ANTICOAGULATION CONSULT NOTE  Pharmacy Consult:  Heparin / Coumadin Indication: History of DVT  No Known Allergies  Patient Measurements: Height: 5\' 5"  (165.1 cm) Weight: 75.6 kg (166 lb 9.6 oz) (scale c) IBW/kg (Calculated) : 61.5  Vital Signs: Temp: 98 F (36.7 C) (07/05 0413) Temp Source: Oral (07/05 0413) BP: 131/74 (07/05 0413) Pulse Rate: 64 (07/05 0413)  Labs: Recent Labs    01/28/21 0120 01/28/21 0949 01/29/21 0252 01/30/21 0301 01/30/21 0753  HGB 13.6  --  13.9 14.3  --   HCT 41.6  --  43.6 44.1  --   PLT 277  --  269 240  --   LABPROT 16.7*  --  17.6* 18.0*  --   INR 1.4*  --  1.5* 1.5*  --   HEPARINUNFRC 0.30   < > 0.54 <0.10* <0.10*  CREATININE 1.70*  --  1.83* 1.85*  --    < > = values in this interval not displayed.     Estimated Creatinine Clearance: 37.8 mL/min (A) (by C-G formula based on SCr of 1.85 mg/dL (H)).   Assessment: 65 y.o. male admitted with SOB/CHF. Patient is on Coumadin PTA for DVT in February 2022 and AFib.  INR sub-therapeutic on admit and patient reports missing 2-3 doses last week but does not routinely miss doses. Given high CHADS-VASC and questionable compliance, pharmacy consulted to bridge IV heparin to Coumadin.  Heparin level undetectable this AM, which is inconsistent with trend.  Repeat heparin level remains undetectable.  Confirmed with RN and patient that heparin was not interrupted.  Inspected pump and IV site and no issue noted.  INR sub-therapeutic at 1.5.  No bleeding reported.  Home Coumadin regimen: 5mg  daily except 2.5mg  on Sundays   Goal of Therapy:  Heparin level 0.3-0.7 units/mL INR 2-3 Monitor platelets by anticoagulation protocol: Yes   Plan:  Increase heparin gtt to 1400 at units/hr Repeat Coumadin 7.5mg  PO today Check 6 hr heparin level Daily heparin level, PT/INR and CBC  [name], PharmD, BCPS, [place] 01/30/2021, 8:56 AM

## 2021-01-30 NOTE — Telephone Encounter (Signed)
No change in treatment

## 2021-01-30 NOTE — Progress Notes (Signed)
SATURATION QUALIFICATIONS: (This note is used to comply with regulatory documentation for home oxygen)  Patient Saturations on Room Air at Rest = 88%  Patient Saturations on Room Air while Ambulating = 82%  Patient Saturations on 4 Liters of oxygen while Ambulating = 92%  Please briefly explain why patient needs home oxygen: Patient maintains saturations 88% or below on room air at rest but saturations increased to 97% on 2L at rest. During short ambulation, patient became short of breath and saturated 82% on RA. When placed on 2L during ambulation, saturations read 84%. When increased to 4L, saturations read 90-92%. For longer periods of activity, 6L may be required to maintains saturations greater than 92%.   Dione Plover., RN.

## 2021-01-30 NOTE — Progress Notes (Signed)
Progress Note  Patient Name: Eugene [name] Date of Encounter: 01/31/2021  [facility] Cardiologist: Werner Lean, MD   Subjective   Patient continues to feel better. Remains on oxygen and will likely need on discharge. LE edema resolved. Hoping to go home soon.  Cr bumped slightly to 1.9. Transitioned to PO lasix today Wt stable at 166lbs  Inpatient Medications    Scheduled Meds:  clopidogrel  75 mg Oral Daily   furosemide  40 mg Oral BID   guaiFENesin  600 mg Oral BID   guaiFENesin-dextromethorphan  10 mL Oral Q6H   insulin aspart  0-15 Units Subcutaneous TID WC   insulin aspart  0-5 Units Subcutaneous QHS   insulin glargine  30 Units Subcutaneous Daily   metoprolol succinate  50 mg Oral BID   mycophenolate  1,000 mg Oral BID   pantoprazole  40 mg Oral Daily   polyethylene glycol  17 g Oral Daily   predniSONE  5 mg Oral Q breakfast   ranolazine  500 mg Oral Daily   rosuvastatin  40 mg Oral Daily   sodium chloride flush  3 mL Intravenous Q12H   warfarin  5 mg Oral ONCE-1600   Warfarin - Pharmacist Dosing Inpatient   Does not apply q1600   Continuous Infusions:  heparin 1,400 Units/hr (01/31/21 0339)   PRN Meds: acetaminophen **OR** acetaminophen   Vital Signs    Vitals:   01/30/21 1016 01/30/21 1143 01/30/21 1945 01/31/21 0315  BP:  122/84 111/60 93/65  Pulse:  70 78 62  Resp:  18 16 20   Temp:  (!) 97.4 F (36.3 C) 98.6 F (37 C) 98.3 F (36.8 C)  TempSrc:  Oral Oral Oral  SpO2: 96% 97% 100% 98%  Weight:    75.7 kg  Height:        Intake/Output Summary (Last 24 hours) at 01/31/2021 1015 Last data filed at 01/31/2021 0943 Gross per 24 hour  Intake 957 ml  Output --  Net 957 ml   Last 3 Weights 01/31/2021 01/30/2021 01/29/2021  Weight (lbs) 166 lb 12.8 oz 166 lb 9.6 oz 168 lb  Weight (kg) 75.66 kg 75.569 kg 76.204 kg      Telemetry    NSR - Personally Reviewed  ECG    No new tracing 01/31/21- Personally Reviewed  Physical Exam   GEN: No  acute distress. Sitting in bed  Neck: No significant JVD Cardiac: RRR, no murmurs Respiratory: Clear, no wheezes GI: Obese, soft, nontender, non-distended  MS: Warm, no edema Neuro:  Nonfocal  Psych: Normal affect   Labs    High Sensitivity Troponin:   Recent Labs  Lab 01/26/21 1426 01/26/21 1728  TROPONINIHS 59* 59*      Chemistry Recent Labs  Lab 01/29/21 0252 01/30/21 0301 01/31/21 0120  NA 134* 136 134*  K 4.2 3.4* 3.7  CL 97* 100 100  CO2 28 26 25   GLUCOSE 109* 102* 124*  BUN 24* 28* 26*  CREATININE 1.83* 1.85* 1.94*  CALCIUM 9.1 9.0 9.0  GFRNONAA 40* 40* 38*  ANIONGAP 9 10 9      Hematology Recent Labs  Lab 01/28/21 0120 01/29/21 0252 01/30/21 0301  WBC 12.7* 14.0* 12.4*  RBC 4.69 4.85 4.99  HGB 13.6 13.9 14.3  HCT 41.6 43.6 44.1  MCV 88.7 89.9 88.4  MCH 29.0 28.7 28.7  MCHC 32.7 31.9 32.4  RDW 14.6 14.5 14.5  PLT 277 269 240    BNP Recent Labs  Lab  01/26/21 1431  BNP 419.9*     DDimer No results for input(s): DDIMER in the last 168 hours.   Radiology    No results found.  Cardiac Studies   TTE 09/14/20   1. Left ventricular ejection fraction, by estimation, is 45 to 50%. The  left ventricle has mildly decreased function. The left ventricle  demonstrates regional wall motion abnormalities (see scoring  diagram/findings for description).   2. Right ventricular systolic function is normal. The right ventricular  size is normal.   3. The mitral valve is normal in structure. Trivial mitral valve  regurgitation. No evidence of mitral stenosis.   4. The aortic valve is grossly normal. There is mild calcification of the  aortic valve. There is mild thickening of the aortic valve. Aortic valve  regurgitation is not visualized. No aortic stenosis is present.   Comparison(s): Prior images reviewed side by side.   Conclusion(s)/Recommendation(s): Llimited study for EF. Patient is now in  atrial fibrillation. The EF is mildly reduced, with  slight focal wall  motion abnormalities as noted. Prior images viewed side by side. The  pattern is very similar to prior, but  better seen with definity contrast on current study. Findings discussed  with Dr. Margaretann Loveless.   Cath 08/2020 Prox LAD lesion is 99% stenosed. Ost LAD to Prox LAD lesion is 100% stenosed. Mid LAD lesion is 80% stenosed. 1st Mrg lesion is 99% stenosed. Ost Cx to Prox Cx lesion is 80% stenosed. Mid Cx lesion is 100% stenosed. Balloon angioplasty was performed. Prox RCA to Dist RCA lesion is 100% stenosed with 100% stenosed side branch in RPAV. Ost Ramus lesion is 100% stenosed. SVG and is normal in caliber. Previously placed Prox Graft drug eluting stent is widely patent. LIMA and is large. The graft exhibits no disease. SVG and is normal in caliber. Origin to Prox Graft lesion is 100% stenosed. SVG and is large. Prox Graft to Dist Graft lesion is 100% stenosed. Dist Cx lesion is 95% stenosed. Mid LM to Dist LM lesion is 80% stenosed. Post intervention, there is a 20% residual stenosis.   Severe native coronary obstructive disease 80% distal left main stenosis, total occlusion of the LAD, ramus immediate vessel, high-grade circumflex stenoses, total occlusion of the proximal RCA.  There was a shunt to the distal RCA via left to right and right to right collateralization.   Patent LIMA graft supplying the mid LAD with diffuse disease in the LAD after insertion.   Supplying the OM 2 vessel with widely patent stent in the proximal portion of the SVG.  There is 95% in-stent restenosis in the native OM 2 vessel beyond the anastomosis.   Old occlusion of the vein graft which had supplied the ramus vessel.   Old occlusion of the vein graft which had supplied the PDA.   Difficult but ultimately successful intervention through the SVG to the distal OM 2 vessel requiring PTCA, [name] Cutting Balloon, and ultimate noncompliant balloon dilatation with a 95% stenosis  being reduced to less than 20%.   LVEDP: 11 mmHg   RECOMMENDATION: DAPT probably indefinitely.  Since the patient developed early in-stent restenosis on aspirin/Plavix, Brilinta was utilized for more aggressive antiplatelet therapy.  Medical therapy for concomitant CAD.  Optimal blood pressure control, diabetes management, and aggressive lipid therapy with target LDL less than 70.  Patient Profile     65 y.o. male with a history of CAD s/p CABG and multiple subsequent PCIs, VF arrest s/p STJ ICD  implanted 2012, chronic combined CHF, obesity, HTN, DM, reported TIA/CVA, CKD stage IIIb, PAF on warfarin, DVT, possible ILD, abnormal CK/rheum markers 08/2020, transaminitis, diverticular disease, dyslipidemia, anxiety, DDD, PAD who presented with shortness of breath and cough found to have BNP 419 consistent with acute on chronic combined heart failure exacerbation.  Assessment & Plan    #Acute on Chronic Combined Systolic and Diastolic HF: Patient presented with worsening SOB and cough found to have BNP 400 with LE edema on exam consistent with acute on chronic combined systolic HF exacerbation. TTE on this admission consistent with prior with LVEF 40-45% with mulitple WMA as described above. While patient is overloaded, the degree of symptoms seem out-of-proportion to volume status. Possibly underlying ILD is contributing. Overall volume status improving with lasix. Would like to check CMR to rule out infiltrative process as well given underlying rheumatologic condition, multiple WMA, and ICD shocks, however, ICD device is not compatible with MRI  -Unable to perform CMR as ICD device not compatible -Transitioned to lasix 40mg  PO daily today; can take additional doses as needed for LE edema, weight gain or worsening SOB/orthopnea -Continue metop succinate 50mg  XL BID -Add GDMT as able as out-patient pending renal function -Monitor I/Os and daily weights -Low Na diet -Degree of symptoms/hypoxia  out-of-proportion to HF; suspect possible ILD may be contributing--will need Pulm f/u as out-patient as detailed below  #Multivessel CAD s/p CABG with multiple subsequent PCIs: Patient with history of CAD s/p CABG with multiple subsequent PCIs. Most recent admission on 08/2020 with NSTEMI found to have ISR s/p PTCA.  Has had a lot of confusion about antiplatelet and AC therapy post-cath. Was recommended for ticagrelor and warfarin but ultimately the patient did not tolerate the ticagrelor due to SOB and was switched back to plavix and warfarin per his PCP. Also was previously on repatha but this was stopped per patient preference and transitioned back to crestor. Overall, there appears to be a lot of confusion about his medication regimen and there is concern for noncompliance. Will need to have a very clear regimen going forward to help improve adherence. Fortunately, low suspicion for ACS as etiology of current presentation. Trop flat and no agnina.  -Plan for plavix 75mg  daily and warfarin going forward -Did not tolerate ticagrelor due to SOB and "panic attacks"--will defer antiplatelet plan to out-patient Cardiologist -Continue crestor 40mg  daily with goal LDL<70; would likely benefit from being back on repatha +/- zetia; can continue ongoing discussions with out-patient cardiologist -Continue metop 50mg  XL BID    #History of DVT (08/2020): On warfarin but INR 1.1. on admission and patient admits to missing doses. -Resume warfarin with heparin as bridge as detailed below -Management per pharmacy  #Paroxysmal Afib: CHADs-vasc 7. In NSR currently. Given high CHADs score and subtherapeutic INR, will plan for heparin gtt until INR at goal of 2-3. -Continue heparin gtt until INR therapeutic -Continue warfarin per pharmacy -Continue metop 50mg  XL BID  #VT with ICD shock: #History of VF arrest s/p ICD in 2012: Patient with recent shock on 6/27 in the setting of worsening volume status. Also with  shock on 4/18 for which he was seen by EP. Low suspicion for ischemia, but there is concern that possible infiltrative process may be contributing. Will pursue CMR (if able pending renal function) as above. -Management of HF as above -Continue metop 50mg  XL BID -Keep K>4, Mg>2  #ILD: #Positive double-stranded DNA: Management per Rheum and Pulm. -Continue pred and MMF -Will need close follow-up  with Pulm and rheum as out-patient -Will likely need O2 at discharge  #HTN: Stable and controlled. -Continue metop as above  #CKD stage IIIB: -Trend with diuresis  Cardiology will sign-off. Will arrange for follow-up with primary cardiologist and EP. Continue current medications.     For questions or updates, please contact [place] Please consult [email or web address] for contact info under        Signed, Freada Bergeron, MD  01/31/2021, 10:15 AM

## 2021-01-30 NOTE — Progress Notes (Signed)
ANTICOAGULATION CONSULT NOTE  Pharmacy Consult:  Heparin / Coumadin Indication: History of DVT  No Known Allergies  Patient Measurements: Height: 5\' 5"  (165.1 cm) Weight: 75.6 kg (166 lb 9.6 oz) (scale c) IBW/kg (Calculated) : 61.5  Vital Signs: Temp: 97.4 F (36.3 C) (07/05 1143) Temp Source: Oral (07/05 1143) BP: 122/84 (07/05 1143) Pulse Rate: 70 (07/05 1143)  Labs: Recent Labs    01/28/21 0120 01/28/21 0949 01/29/21 0252 01/30/21 0301 01/30/21 0753 01/30/21 1555  HGB 13.6  --  13.9 14.3  --   --   HCT 41.6  --  43.6 44.1  --   --   PLT 277  --  269 240  --   --   LABPROT 16.7*  --  17.6* 18.0*  --   --   INR 1.4*  --  1.5* 1.5*  --   --   HEPARINUNFRC 0.30   < > 0.54 <0.10* <0.10* 0.12*  CREATININE 1.70*  --  1.83* 1.85*  --   --    < > = values in this interval not displayed.     Estimated Creatinine Clearance: 37.8 mL/min (A) (by C-G formula based on SCr of 1.85 mg/dL (H)).   Assessment: 65 y.o. male admitted with SOB/CHF. Patient is on Coumadin PTA for DVT in February 2022 and AFib.  INR sub-therapeutic on admit and patient reports missing 2-3 doses last week but does not routinely miss doses. Given high CHADS-VASC and questionable compliance, pharmacy consulted to bridge IV heparin to Coumadin.  Heparin level undetectable this AM, which is inconsistent with trend. Heparin level of 0.12 is subtherapeutic on increased rate of heparin 1400 units/hr. Level drawn appropriately. Per RN, no issues with IV infusion or access.   Home Coumadin regimen: 5mg  daily except 2.5mg  on Sundays   Goal of Therapy:  Heparin level 0.3-0.7 units/mL INR 2-3 Monitor platelets by anticoagulation protocol: Yes   Plan:  Increase heparin to 1600 units/hr  Check 6 hr heparin level  Daily heparin level, PT/INR and CBC  Warfarin administered correctly as ordered   Cristela Felt, PharmD Clinical Pharmacist  01/30/2021, 5:48 PM

## 2021-01-31 DIAGNOSIS — I2510 Atherosclerotic heart disease of native coronary artery without angina pectoris: Secondary | ICD-10-CM

## 2021-01-31 LAB — HEPARIN LEVEL (UNFRACTIONATED): Heparin Unfractionated: 0.79 IU/mL — ABNORMAL HIGH (ref 0.30–0.70)

## 2021-01-31 LAB — BASIC METABOLIC PANEL
Anion gap: 9 (ref 5–15)
BUN: 26 mg/dL — ABNORMAL HIGH (ref 8–23)
CO2: 25 mmol/L (ref 22–32)
Calcium: 9 mg/dL (ref 8.9–10.3)
Chloride: 100 mmol/L (ref 98–111)
Creatinine, Ser: 1.94 mg/dL — ABNORMAL HIGH (ref 0.61–1.24)
GFR, Estimated: 38 mL/min — ABNORMAL LOW (ref >60)
Glucose, Bld: 124 mg/dL — ABNORMAL HIGH (ref 70–99)
Potassium: 3.7 mmol/L (ref 3.5–5.1)
Sodium: 134 mmol/L — ABNORMAL LOW (ref 135–145)

## 2021-01-31 LAB — GLUCOSE, CAPILLARY
Glucose-Capillary: 109 mg/dL — ABNORMAL HIGH (ref 70–99)
Glucose-Capillary: 214 mg/dL — ABNORMAL HIGH (ref 70–99)

## 2021-01-31 LAB — PROTIME-INR
INR: 1.9 — ABNORMAL HIGH (ref 0.8–1.2)
Prothrombin Time: 21.5 seconds — ABNORMAL HIGH (ref 11.4–15.2)

## 2021-01-31 MED ORDER — PREDNISONE 5 MG PO TABS: 5.0000 mg | ORAL_TABLET | Freq: Every day | ORAL | Status: DC

## 2021-01-31 MED ORDER — WARFARIN SODIUM 5 MG PO TABS: 5.0000 mg | ORAL_TABLET | Freq: Once | ORAL | Status: DC

## 2021-01-31 MED ORDER — WARFARIN SODIUM 5 MG PO TABS: 2.5000 mg | ORAL_TABLET | ORAL | Status: AC

## 2021-01-31 MED ORDER — GUAIFENESIN-DM 100-10 MG/5ML PO SYRP: 10.0000 mL | ORAL_SOLUTION | Freq: Four times a day (QID) | ORAL | 1 refills | Status: AC | PRN

## 2021-01-31 MED ORDER — RANOLAZINE ER 500 MG PO TB12: 500.0000 mg | ORAL_TABLET | Freq: Two times a day (BID) | ORAL | 0 refills | Status: AC

## 2021-01-31 MED ORDER — FUROSEMIDE 40 MG PO TABS: 40.0000 mg | ORAL_TABLET | Freq: Every day | ORAL | Status: DC

## 2021-01-31 NOTE — Consult Note (Signed)
   [facility] Inpatient Consult   01/31/2021  KHANG HANNUM 06/16/1956 [id number]  [place] [facility] [ACO] Patient: [facility] Medicare  Patient screened for hospitalization with noted medium risk score for unplanned readmission risk and to assess for potential [place] Management service needs for post hospital transition. Patient has had  Review of patient's medical record reveals patient is for returning home. Patient discussed in unit progression meeting as well.  Met with patient and spouse, [name] at the bedside to follow up on Hemoglobin A1C above 9.0.  Patient admits it is ongoing and agrees to post hospital follow up with community care management. Explained about post hospital calls and follow up for HF and Diabetes.  SDOH intact per inpatient TOC RNCM. Verified HIPAA for best contact information as well [can leave a voice mail message] [phone].  Plan:  Patient to be assigned to [facility] Coordinator for support and follow up.    For questions contact:   Natividad Brood, RN BSN [facility] Liaison  [phone] business mobile phone Toll free office [phone]  Fax number: [phone] [email or web address]

## 2021-01-31 NOTE — Plan of Care (Signed)

## 2021-01-31 NOTE — Progress Notes (Signed)
Physical Therapy Treatment Patient Details Name: Eugene [name] MRN: [id number] DOB: Apr 18, 1956 Today's Date: 01/31/2021    History of Present Illness Pt is a 65 y.o. male admitted 01/26/2021 with ongoing intermitten shortness of breath. CXR with diffuse reticular pattern bilaterally consistent with ILD with mild cardiomegaly. PMH includes CVA, CAD s/p CABG, CHF, CKD 3B, DDD, diabetes, GERD, hyperlipidemia, peripheral arterial disease, hypertension, long QT, V. fib status post AICD, ILD, sleep apnea, CAD (s/p CABG, PCI; vfib arrest s/p ICD), CKD IV, HTN, DM.    PT Comments    Pt tolerates treatment well with one period of desaturation and increased work of breathing with attempts to wean supplemental oxygen with activity. Pt mobilizes without physical assistance at this time. Pt will continue to benefit from aggressive mobilization and HHPT at the time of discharge to aide in implementing a walking program in an effort to continue improving activity tolerance and cardiopulmonary function.   Follow Up Recommendations  Home health PT     Equipment Recommendations  None recommended by PT    Recommendations for Other Services       Precautions / Restrictions Precautions Precautions: Fall Precaution Comments: watch O2 Restrictions Weight Bearing Restrictions: No    Mobility  Bed Mobility Overal bed mobility: Independent                  Transfers Overall transfer level: Independent                  Ambulation/Gait Ambulation/Gait assistance: Supervision Gait Distance (Feet): 250 Feet Assistive device: None Gait Pattern/deviations: Step-through pattern;Drifts right/left Gait velocity: functional Gait velocity interpretation: 1.31 - 2.62 ft/sec, indicative of limited community ambulator General Gait Details: mild increase in lateral drift. one standing rest break with desaturation and increased work of breathing   Physiological scientist [place] (Stroke Patients Only)       Balance Overall balance assessment: Needs assistance Sitting-balance support: No upper extremity supported;Feet supported Sitting balance-Leahy Scale: Good     Standing balance support: No upper extremity supported Standing balance-Leahy Scale: Good                              Cognition Arousal/Alertness: Awake/alert Behavior During Therapy: WFL for tasks assessed/performed Overall Cognitive Status: Within Functional Limits for tasks assessed                                        Exercises      General Comments General comments (skin integrity, edema, etc.): pt on 2L [place] at rest, iniaites ambulation on 4L [place] with sats in mid 90s. PT weans pt to 3L with desat to 78% with activity. Pt requires 6L [place] to recover to 92% SpO2. Pt is then able to ambulate on 4L [place] with stable sats for remainder of session      Pertinent Vitals/Pain Pain Assessment: No/denies pain    Home Living                      Prior Function            PT Goals (current goals can now be found in the care plan section) Acute Rehab PT Goals Patient Stated Goal: to be able to [facility]  the yard again Progress towards PT goals: Progressing toward goals    Frequency    Min 3X/week      PT Plan Current plan remains appropriate    Co-evaluation              AM-PAC PT "6 Clicks" Mobility   Outcome Measure  Help needed turning from your back to your side while in a flat bed without using bedrails?: None Help needed moving from lying on your back to sitting on the side of a flat bed without using bedrails?: None Help needed moving to and from a bed to a chair (including a wheelchair)?: None Help needed standing up from a chair using your arms (e.g., wheelchair or bedside chair)?: None Help needed to walk in hospital room?: A Little Help needed climbing 3-5 steps with a railing? : A Little 6 Click Score: 22     End of Session Equipment Utilized During Treatment: Oxygen Activity Tolerance: Patient tolerated treatment well Patient left: in bed;with call bell/phone within reach;with family/visitor present Nurse Communication: Mobility status PT Visit Diagnosis: Other abnormalities of gait and mobility (R26.89);Muscle weakness (generalized) (M62.81);Difficulty in walking, not elsewhere classified (R26.2)     Time: 2229-7989 PT Time Calculation (min) (ACUTE ONLY): 11 min  Charges:  $Therapeutic Activity: 8-22 mins                     Zenaida Niece, PT, DPT Acute Rehabilitation Pager: [phone]    Zenaida Niece 01/31/2021, 2:44 PM

## 2021-01-31 NOTE — Significant Event (Signed)
SATURATION QUALIFICATIONS: (This note is used to comply with regulatory documentation for home oxygen)  Patient Saturations on Room Air at Rest = 93%  Patient Saturations on 2L while Ambulating = 81 %  Patient Saturations on 4 Liters of oxygen while Ambulating = 96%  Please briefly explain why patient needs home oxygen: Patient Desats on 2L while ambulating with SOB requiring rest stop and 4L of 02.

## 2021-01-31 NOTE — Discharge Summary (Signed)
Physician Discharge Summary  Eugene [name]:[id number] DOB: Nov 04, 1955 DOA: 01/26/2021  PCP: [name], MD  Admit date: 01/26/2021 Discharge date: 01/31/2021  Admitted From: Home Disposition:  Home  Discharge Condition:Stable CODE STATUS:FULL Diet recommendation: Heart Healthy    Brief/Interim Summary:  Patient is a 65 year old male with history of CVA, coronary artery artery disease status post CABG, systolic CHF, CKD stage III, degenerative disc disease, cystic fibrosis, diabetes type 2, GERD, hyperlipidemia, peripheral artery disease, hypertension ,prolonged QT, V. fib status post AICD placement, interstitial lung disease, sleep apnea, anxiety who presented with shortness of breath, cough, increased bilateral lower extremity edema.  Patient also reported that his AICD had fired twice recently.  He follows with Dr. Lovena Le, [place].  On presentation he was hypoxic on room air requiring supplemental oxygen, he does not use oxygen at home.  CBC showed leukocytosis, he had elevated BNP at 419.  Chest x-ray showed diffuse reticular pattern consistent with ILD, cardiomegaly.  Patient was admitted for the management of acute exacerbation of CHF, started on IV Lasix.  Cardiology also consulted and following.  He is symptomatically improved but continues to require oxygen for maintenance of saturation and he desaturates on room air on ambulation.  He qualified for home oxygen.  His acute hypoxic respiratory failure could be secondary to the combination of CHF and ILD.  We strongly recommend to follow-up with cardiology, rheumatology and pulmonology as an outpatient.  Following problems were addressed during his hospitalization:  Acute on chronic combined  systolic/diastolic congestive heart failure: Presented with cough, shortness of breath, bilateral lower extremity swelling.  Hypoxic on presentation.  Takes Lasix 40 mg once a day at home. Patient was found to be volume overloaded on presentation.   Elevated BNP.  Started on IV Lasix.  Echo done on this admission showed ejection fraction of 45 to 50%, regional wall motion abnormality in the left ventricle, grade 1 diastolic dysfunction cardiology also consulted and  were following. Will continue on 40 mg Lasix daily at home.   History of V. fib status post AICD: He reports that his AICD fired twice, last firing was last Friday(6/24).  He follows with Dr. Lovena Le   Acute hypoxic respiratory failure: Most likely secondary to combination of CHF exacerbation, interstitial lung disease.  He does not use oxygen at home.  Was requiring 3 to 4 L of oxygen per minute.  He feels better symptomatically.  .Chest x-ray showed diffuse  reticular pattern consistent with ILD, cardiomegaly. He follows with Dr Chase Caller as an outpatient.  He will qualify for home oxygen  He needs to follow-up with Dr. Gerome Apley as an outpatient as soon as possible.   History of coronary artery disease: Status post CABG.  Denies chest pain.  Minimally elevated flat trended troponin.  Continue home metoprolol, Plavix, aspirin   History of CVA/peripheral artery disease, hyperlipidemia: On Crestor and Plavix at home.   History of ILD: Follows with rheumatology.  Takes prednisone, mycophenolate.  Has positive double-stranded DNA.  We recommend to follow-up with rheumatology as outpatient.I talked to his rheumatologist Dr Kathlene November on 01/29/21.   Leukocytosis: Chronic, most likely associated with prednisone therapy.  Continue to monitor.   Diabetes type 2: Monitor blood sugars.  Continue Lantus and sliding scale insulin.   CKD stage IIIb: Currently kidney function at baseline.  Continue to monitor as an outpatient   History of DVT: On warfarin at home, reported that he missed few doses.  He was on  heparin drip and warfarin .  Continue Coumadin on discharge.   Paroxysmal A. fib: Continue anticoagulation.  On metoprolol.  Currently rate is controlled.  And on sinus rhythm    Debility/deconditioning: PT/OT consulted,HH recommended           Discharge Diagnoses:  Principal Problem:   Acute exacerbation of CHF (congestive heart failure) (HCC) Active Problems:   Hyperlipidemia   HYPERTENSION, BENIGN   CAD, NATIVE VESSEL   VENTRICULAR FIBRILLATION   Single implantable cardioverter-defibrillator (ICD) in situ   PAD (peripheral artery disease) (HCC)   Coronary artery disease involving coronary bypass graft of native heart with angina pectoris ([place])   History of CVA (cerebrovascular accident)   Gastroesophageal reflux disease without esophagitis   Chronic kidney disease, stage 3b (HCC)   ILD (interstitial lung disease) ([place])    Discharge Instructions  Discharge Instructions     Diet - low sodium heart healthy   Complete by: As directed    Discharge instructions   Complete by: As directed    1)Please take your medications as instructed. 2)Follow up with your PCP in a week 3)Follow up with your cardiologist, rheumatologist and pulmonologist in 2 weeks 4)Monitor your blood pressure at home. 5)Monitor your INR   Increase activity slowly   Complete by: As directed       Allergies as of 01/31/2021   No Known Allergies      Medication List     TAKE these medications    clopidogrel 75 MG tablet Commonly known as: PLAVIX Take 75 mg by mouth daily.   furosemide 40 MG tablet Commonly known as: LASIX Take 40 mg by mouth daily.   guaiFENesin-dextromethorphan 100-10 MG/5ML syrup Commonly known as: ROBITUSSIN DM Take 10 mLs by mouth every 6 (six) hours as needed for cough.   insulin glargine 100 UNIT/ML Solostar Pen Commonly known as: LANTUS Inject 60 Units into the skin daily before breakfast.   Insulin Syringe-Needle U-100 25G X 1" 1 ML Misc For 4 times a day insulin SQ, 1 month supply. Diagnosis E11.65   metoprolol succinate 50 MG 24 hr tablet Commonly known as: TOPROL-XL Take 1 tablet (50 mg total) by mouth in the morning and at  bedtime. Take with or immediately following a meal.   mycophenolate 500 MG tablet Commonly known as: CELLCEPT Take 1,000 mg by mouth 2 (two) times daily.   nitroGLYCERIN 0.4 MG SL tablet Commonly known as: NITROSTAT PLACE 1 TABLET UNDER THE TONGUE EVERY 5 MINUTES AS NEEDED FOR CHEST PAIN   Ozempic (1 MG/DOSE) 4 MG/3ML Sopn Generic drug: Semaglutide (1 MG/DOSE) Inject 1 mg into the skin once a week.   pantoprazole 40 MG tablet Commonly known as: PROTONIX Take 40 mg by mouth daily.   predniSONE 5 MG tablet Commonly known as: DELTASONE Take 1 tablet (5 mg total) by mouth daily with breakfast.   ranolazine 500 MG 12 hr tablet Commonly known as: RANEXA Take 1 tablet (500 mg total) by mouth 2 (two) times daily. TAKE ONE TABLET BY MOUTH TWICE DAILY.PLEASE MAKE APPT WITH DR.HARDING FOR REFILLS. What changed: See the new instructions.   rosuvastatin 40 MG tablet Commonly known as: CRESTOR Take 1 tablet (40 mg total) by mouth daily.   vitamin B-12 1000 MCG tablet Commonly known as: CYANOCOBALAMIN TAKE 1 TABLET (1,000 MCG TOTAL) BY MOUTH DAILY.   warfarin 5 MG tablet Commonly known as: COUMADIN Take 0.5-1 tablets (2.5-5 mg total) by mouth See admin instructions. Take 1 tablet (5 mg) daily except on Sun Take 1/2 tablet (  2.5 mg)       ASK your doctor about these medications    insulin aspart 100 UNIT/ML injection Commonly known as: NovoLOG Substitute to any brand approved.Before each meal 3 times a day, 140-199 - 2 units, 200-250 - 4 units, 251-299 - 6 units,  300-349 - 8 units,  350 or above 10 units. Dispense syringes and needles as needed, Ok to switch to PEN if approved. DX DM2, Code E11.65               Durable Medical Equipment  (From admission, onward)           Start     Ordered   01/31/21 0741  For home use only DME oxygen  Once       Question Answer Comment  Length of Need Lifetime   Mode or (Route) Nasal cannula   Liters per Minute 4   Frequency  Continuous (stationary and portable oxygen unit needed)   Oxygen delivery system Gas      01/31/21 0741            Follow-up Information     [name], MD. Schedule an appointment as soon as possible for a visit in 1 week(s).   Specialty: Endocrinology Contact information: [place] [address] [phone]                No Known Allergies  Consultations: Cardiology   Procedures/Studies: DG Chest 2 View  Result Date: 01/26/2021 CLINICAL DATA:  Shortness of breath and cough EXAM: CHEST - 2 VIEW COMPARISON:  09/08/2020, CT 09/09/2020 FINDINGS: Post sternotomy changes. Left-sided pacing device as before. Recording device over the left chest. Diffuse bilateral reticular opacity consistent with chronic interstitial lung disease. No acute superimposed airspace disease. Mild cardiomegaly. No pleural effusion or pneumothorax. IMPRESSION: 1. Diffuse bilateral reticular opacity consistent with chronic interstitial lung disease as demonstrated on prior CT. No acute confluent airspace disease. 2. Mild cardiomegaly Electronically Signed   By: Donavan Foil M.D.   On: 01/26/2021 15:19   ECHOCARDIOGRAM COMPLETE  Result Date: 01/27/2021    ECHOCARDIOGRAM REPORT   Patient Name:   Eugene [name] [facility] Date of Exam: 01/27/2021 Medical Rec #:  [id number]    Height:       65.0 in Accession #:    [id number]   Weight:       173.5 lb Date of Birth:  [birth year]   BSA:          1.862 m Patient Age:    76 years     BP:           96/64 mmHg Patient Gender: M            HR:           76 bpm. Exam Location:  Inpatient Procedure: 2D Echo, Color Doppler, Cardiac Doppler and Intracardiac            Opacification Agent Indications:    T73.22 Acute systolic (congestive) heart failure  History:        Patient has prior history of Echocardiogram examinations, most                 recent 09/14/2020. CHF, CAD, Defibrillator; Risk                 Factors:Hypertension, Diabetes, Dyslipidemia, Sleep Apnea  and                 COVID+ 08/2020.  Sonographer:  [place] Referring Phys: [id number] Estalene Bergey IMPRESSIONS  1. Left ventricular ejection fraction, by estimation, is 45 to 50%. The left ventricle has mildly decreased function. The left ventricle demonstrates regional wall motion abnormalities.     There is hypokinesis of the basal-to-mid anteroseptal, basal-to-mid anterior, basal-to-mid inferior and basal inferoseptal LV walls. Left ventricular diastolic parameters are consistent with Grade I diastolic dysfunction (impaired relaxation).  2. Right ventricular systolic function is mildly reduced. The right ventricular size is not well visualized.  3. The mitral valve is grossly normal. Trivial mitral valve regurgitation. No evidence of mitral stenosis.  4. The aortic valve is tricuspid. There is mild calcification of the aortic valve. There is moderate thickening of the aortic valve. Aortic valve regurgitation is not visualized. Mild to moderate aortic valve sclerosis/calcification is present, without any evidence of aortic stenosis. Comparison(s): Compared to prior limited TTE in 09/14/20, there is no significant change. There continues to be WMA as described above. FINDINGS  Left Ventricle: Left ventricular ejection fraction, by estimation, is 45 to 50%. The left ventricle has mildly decreased function. The left ventricle demonstrates regional wall motion abnormalities. Definity contrast agent was given IV to delineate the left ventricular endocardial borders. There is hypokinesis of the basal-to-mid anteroseptal, basal-to-mid anterior, basal-to-mid inferior and basal inferoseptal LV walls. The left ventricular internal cavity size was normal in size. There is no left ventricular hypertrophy. Left ventricular diastolic parameters are consistent with Grade I diastolic dysfunction (impaired relaxation). Right Ventricle: The right ventricular size is not well visualized. Right vetricular wall thickness  was not well visualized. Right ventricular systolic function is mildly reduced. Left Atrium: Left atrial size was normal in size. Right Atrium: Right atrial size was normal in size. Pericardium: There is no evidence of pericardial effusion. Mitral Valve: The mitral valve is grossly normal. There is mild thickening of the mitral valve leaflet(s). There is mild calcification of the mitral valve leaflet(s). Mild to moderate mitral annular calcification. Trivial mitral valve regurgitation. No evidence of mitral valve stenosis. Tricuspid Valve: The tricuspid valve is normal in structure. Tricuspid valve regurgitation is trivial. Aortic Valve: The aortic valve is tricuspid. There is mild calcification of the aortic valve. There is moderate thickening of the aortic valve. Aortic valve regurgitation is not visualized. Mild to moderate aortic valve sclerosis/calcification is present, without any evidence of aortic stenosis. Pulmonic Valve: The pulmonic valve was not well visualized. Pulmonic valve regurgitation is trivial. Aorta: The aortic root and ascending aorta are structurally normal, with no evidence of dilitation. IAS/Shunts: No atrial level shunt detected by color flow Doppler. Additional Comments: A device lead is visualized.  LEFT VENTRICLE PLAX 2D LVIDd:         4.20 cm  Diastology LVIDs:         3.50 cm  LV e' medial:    3.90 cm/s LV PW:         0.90 cm  LV E/e' medial:  18.9 LV IVS:        0.80 cm  LV e' lateral:   4.95 cm/s LVOT diam:     2.00 cm  LV E/e' lateral: 14.9 LV SV:         39 LV SV Index:   21 LVOT Area:     3.14 cm  RIGHT VENTRICLE RV S prime:     5.78 cm/s TAPSE (M-mode): 1.7 cm LEFT ATRIUM             Index  RIGHT ATRIUM           Index LA diam:        3.50 cm 1.88 cm/m  RA Area:     19.30 cm LA Vol (A2C):   28.2 ml 15.14 ml/m RA Volume:   53.00 ml  28.46 ml/m LA Vol (A4C):   46.7 ml 25.08 ml/m LA Biplane Vol: 35.9 ml 19.28 ml/m  AORTIC VALVE LVOT Vmax:   67.90 cm/s LVOT Vmean:  47.800  cm/s LVOT VTI:    0.124 m  AORTA Ao Root diam: 3.40 cm Ao Asc diam:  3.00 cm MITRAL VALVE MV Area (PHT): 2.83 cm     SHUNTS MV Decel Time: 268 msec     Systemic VTI:  0.12 m MV E velocity: 73.90 cm/s   Systemic Diam: 2.00 cm MV A velocity: 102.00 cm/s MV E/A ratio:  0.72 Gwyndolyn Kaufman MD Electronically signed by Gwyndolyn Kaufman MD Signature Date/Time: 01/27/2021/3:32:43 PM    Final       Subjective: Patient seen and examined the bedside this morning.  Hemodynamically stable for discharge today.  He qualified for home oxygen.  Discharge Exam: Vitals:   01/31/21 0315 01/31/21 1053  BP: 93/65 107/73  Pulse: 62 69  Resp: 20 20  Temp: 98.3 F (36.8 C) 98.1 F (36.7 C)  SpO2: 98% 94%   Vitals:   01/30/21 1143 01/30/21 1945 01/31/21 0315 01/31/21 1053  BP: 122/84 111/60 93/65 107/73  Pulse: 70 78 62 69  Resp: 18 16 20 20   Temp: (!) 97.4 F (36.3 C) 98.6 F (37 C) 98.3 F (36.8 C) 98.1 F (36.7 C)  TempSrc: Oral Oral Oral Oral  SpO2: 97% 100% 98% 94%  Weight:   75.7 kg   Height:        General: Pt is alert, awake, not in acute distress Cardiovascular: RRR, S1/S2 +, no rubs, no gallops Respiratory: CTA bilaterally, no wheezing, no rhonchi Abdominal: Soft, NT, ND, bowel sounds + Extremities: no edema, no cyanosis    The results of significant diagnostics from this hospitalization (including imaging, microbiology, ancillary and laboratory) are listed below for reference.     Microbiology: Recent Results (from the past 240 hour(s))  Resp Panel by RT-PCR (Flu A&B, Covid) Nasopharyngeal Swab     Status: None   Collection Time: 01/26/21 11:14 PM   Specimen: Nasopharyngeal Swab; Nasopharyngeal(NP) swabs in vial transport medium  Result Value Ref Range Status   SARS Coronavirus 2 by RT PCR NEGATIVE NEGATIVE Final    Comment: (NOTE) SARS-CoV-2 target nucleic acids are NOT DETECTED.  The SARS-CoV-2 RNA is generally detectable in upper respiratory specimens during the acute  phase of infection. The lowest concentration of SARS-CoV-2 viral copies this assay can detect is 138 copies/mL. A negative result does not preclude SARS-Cov-2 infection and should not be used as the sole basis for treatment or other patient management decisions. A negative result may occur with  improper specimen collection/handling, submission of specimen other than nasopharyngeal swab, presence of viral mutation(s) within the areas targeted by this assay, and inadequate number of viral copies(<138 copies/mL). A negative result must be combined with clinical observations, patient history, and epidemiological information. The expected result is Negative.  Fact Sheet for Patients:  EntrepreneurPulse.com.au  Fact Sheet for Healthcare Providers:  IncredibleEmployment.be  This test is no t yet approved or cleared by the [place] FDA and  has been authorized for detection and/or diagnosis of SARS-CoV-2 by FDA under an Emergency Use Authorization (  EUA). This EUA will remain  in effect (meaning this test can be used) for the duration of the COVID-19 declaration under Section 564(b)(1) of the Act, 21 U.S.C.section 360bbb-3(b)(1), unless the authorization is terminated  or revoked sooner.       Influenza A by PCR NEGATIVE NEGATIVE Final   Influenza B by PCR NEGATIVE NEGATIVE Final    Comment: (NOTE) The Xpert Xpress SARS-CoV-2/FLU/RSV plus assay is intended as an aid in the diagnosis of influenza from Nasopharyngeal swab specimens and should not be used as a sole basis for treatment. Nasal washings and aspirates are unacceptable for Xpert Xpress SARS-CoV-2/FLU/RSV testing.  Fact Sheet for Patients: EntrepreneurPulse.com.au  Fact Sheet for Healthcare Providers: IncredibleEmployment.be  This test is not yet approved or cleared by the [place] FDA and has been authorized for detection and/or diagnosis of  SARS-CoV-2 by FDA under an Emergency Use Authorization (EUA). This EUA will remain in effect (meaning this test can be used) for the duration of the COVID-19 declaration under Section 564(b)(1) of the Act, 21 U.S.C. section 360bbb-3(b)(1), unless the authorization is terminated or revoked.  Performed at [facility], [place] [address], [place], [place] [address]      Labs: BNP (last 3 results) Recent Labs    09/15/20 0204 09/16/20 0354 01/26/21 1431  BNP 1,259.4* 1,504.0* 885.0*   Basic Metabolic Panel: Recent Labs  Lab 01/27/21 0352 01/27/21 1919 01/28/21 0120 01/29/21 0252 01/30/21 0301 01/31/21 0120  NA 137 135 133* 134* 136 134*  K 3.4* 4.4 4.3 4.2 3.4* 3.7  CL 102 98 98 97* 100 100  CO2 24 28 24 28 26 25   GLUCOSE 176* 127* 139* 109* 102* 124*  BUN 25* 27* 28* 24* 28* 26*  CREATININE 1.68* 1.81* 1.70* 1.83* 1.85* 1.94*  CALCIUM 8.9 9.0 8.6* 9.1 9.0 9.0  MG 2.2  --   --   --   --   --    Liver Function Tests: No results for input(s): AST, ALT, ALKPHOS, BILITOT, PROT, ALBUMIN in the last 168 hours. No results for input(s): LIPASE, AMYLASE in the last 168 hours. No results for input(s): AMMONIA in the last 168 hours. CBC: Recent Labs  Lab 01/26/21 1426 01/27/21 0352 01/28/21 0120 01/29/21 0252 01/30/21 0301  WBC 18.3* 14.9* 12.7* 14.0* 12.4*  NEUTROABS  --   --  8.9* 9.9* 8.7*  HGB 14.8 14.1 13.6 13.9 14.3  HCT 47.6 43.4 41.6 43.6 44.1  MCV 92.6 89.1 88.7 89.9 88.4  PLT 339 292 277 269 240   Cardiac Enzymes: No results for input(s): CKTOTAL, CKMB, CKMBINDEX, TROPONINI in the last 168 hours. BNP: Invalid input(s): POCBNP CBG: Recent Labs  Lab 01/30/21 1140 01/30/21 1556 01/30/21 2109 01/31/21 0559 01/31/21 1054  GLUCAP 374* 230* 183* 109* 214*   D-Dimer No results for input(s): DDIMER in the last 72 hours. Hgb A1c No results for input(s): HGBA1C in the last 72 hours. Lipid Profile No results for input(s): CHOL, HDL, LDLCALC, TRIG,  CHOLHDL, LDLDIRECT in the last 72 hours. Thyroid function studies No results for input(s): TSH, T4TOTAL, T3FREE, THYROIDAB in the last 72 hours.  Invalid input(s): FREET3 Anemia work up No results for input(s): VITAMINB12, FOLATE, FERRITIN, TIBC, IRON, RETICCTPCT in the last 72 hours. Urinalysis    Component Value Date/Time   COLORURINE YELLOW 09/09/2020 0032   APPEARANCEUR CLOUDY (A) 09/09/2020 0032   LABSPEC 1.013 09/09/2020 0032   PHURINE 6.0 09/09/2020 0032   GLUCOSEU 150 (A) 09/09/2020 0032  HGBUR LARGE (A) 09/09/2020 0032   BILIRUBINUR NEGATIVE 09/09/2020 0032   KETONESUR NEGATIVE 09/09/2020 0032   PROTEINUR 100 (A) 09/09/2020 0032   UROBILINOGEN 0.2 09/06/2010 1730   NITRITE NEGATIVE 09/09/2020 0032   LEUKOCYTESUR MODERATE (A) 09/09/2020 0032   Sepsis Labs Invalid input(s): PROCALCITONIN,  WBC,  LACTICIDVEN Microbiology Recent Results (from the past 240 hour(s))  Resp Panel by RT-PCR (Flu A&B, Covid) Nasopharyngeal Swab     Status: None   Collection Time: 01/26/21 11:14 PM   Specimen: Nasopharyngeal Swab; Nasopharyngeal(NP) swabs in vial transport medium  Result Value Ref Range Status   SARS Coronavirus 2 by RT PCR NEGATIVE NEGATIVE Final    Comment: (NOTE) SARS-CoV-2 target nucleic acids are NOT DETECTED.  The SARS-CoV-2 RNA is generally detectable in upper respiratory specimens during the acute phase of infection. The lowest concentration of SARS-CoV-2 viral copies this assay can detect is 138 copies/mL. A negative result does not preclude SARS-Cov-2 infection and should not be used as the sole basis for treatment or other patient management decisions. A negative result may occur with  improper specimen collection/handling, submission of specimen other than nasopharyngeal swab, presence of viral mutation(s) within the areas targeted by this assay, and inadequate number of viral copies(<138 copies/mL). A negative result must be combined with clinical  observations, patient history, and epidemiological information. The expected result is Negative.  Fact Sheet for Patients:  EntrepreneurPulse.com.au  Fact Sheet for Healthcare Providers:  IncredibleEmployment.be  This test is no t yet approved or cleared by the [place] FDA and  has been authorized for detection and/or diagnosis of SARS-CoV-2 by FDA under an Emergency Use Authorization (EUA). This EUA will remain  in effect (meaning this test can be used) for the duration of the COVID-19 declaration under Section 564(b)(1) of the Act, 21 U.S.C.section 360bbb-3(b)(1), unless the authorization is terminated  or revoked sooner.       Influenza A by PCR NEGATIVE NEGATIVE Final   Influenza B by PCR NEGATIVE NEGATIVE Final    Comment: (NOTE) The Xpert Xpress SARS-CoV-2/FLU/RSV plus assay is intended as an aid in the diagnosis of influenza from Nasopharyngeal swab specimens and should not be used as a sole basis for treatment. Nasal washings and aspirates are unacceptable for Xpert Xpress SARS-CoV-2/FLU/RSV testing.  Fact Sheet for Patients: EntrepreneurPulse.com.au  Fact Sheet for Healthcare Providers: IncredibleEmployment.be  This test is not yet approved or cleared by the [place] FDA and has been authorized for detection and/or diagnosis of SARS-CoV-2 by FDA under an Emergency Use Authorization (EUA). This EUA will remain in effect (meaning this test can be used) for the duration of the COVID-19 declaration under Section 564(b)(1) of the Act, 21 U.S.C. section 360bbb-3(b)(1), unless the authorization is terminated or revoked.  Performed at [facility], [place] [address], [place], [place] [address]     Please note: You were cared for by a hospitalist during your hospital stay. Once you are discharged, your primary care physician will handle any further medical issues. Please note that NO  REFILLS for any discharge medications will be authorized once you are discharged, as it is imperative that you return to your primary care physician (or establish a relationship with a primary care physician if you do not have one) for your post hospital discharge needs so that they can reassess your need for medications and monitor your lab values.    Time coordinating discharge: 40 minutes  SIGNED:   Shelly Coss, MD  [facility] 01/31/2021,  11:27 AM Pager [phone]  If 7PM-7AM, please contact night-coverage [email or web address] Password [email or web address]

## 2021-01-31 NOTE — Plan of Care (Signed)

## 2021-01-31 NOTE — Progress Notes (Signed)
D/C instructions given and reviewed. Tele and IV removed, tolerated well. Awaiting O2 delivery.

## 2021-01-31 NOTE — TOC Transition Note (Signed)
Transition of Care [facility]) - CM/SW Discharge Note   Patient Details  Name: Eugene [name] MRN: [id number] Date of Birth: 18-Aug-1955  Transition of [facility]) CM/SW Contact:  Zenon Mayo, RN Phone Number: 01/31/2021, 11:45 AM   Clinical Narrative:    NCM spoke with patient, NCM offered choice for HHPT, he chose [facility], NCM made referral to [facility].  She is able to take referral.  Soc will begin 24 to 48 hrs post dc.  Patient will also need home oxygen.  He is ok with [employer] Supplying his home oxygen. [employer] will bring a tank to the room and the concentrator will be set up at home.   He has a scale and bp cuff at home.  He has no issues with taking his meds or paying for meds.  He has transportation to his MD apts.  [facility] will also follow patient in the community for his Diabetes.    Final next level of care: [place] Barriers to Discharge: No Barriers Identified   Patient Goals and CMS Choice Patient states their goals for this hospitalization and ongoing recovery are:: return home CMS Medicare.gov Compare Post Acute Care list provided to:: Patient Choice offered to / list presented to : Patient  Discharge Placement                       Discharge Plan and Services                DME Arranged: Oxygen DME Agency: [employer] Date DME Agency Contacted: 01/31/21 Time DME Agency Contacted: 0600 Representative spoke with at DME Agency: Thedore Mins HH Arranged: PT [place]: [place] Date [place]: 01/31/21 Time [place]: [id number] Representative spoke with at [place]: [place] Determinants of Health ([place]) Interventions     Readmission Risk Interventions No flowsheet data found.

## 2021-01-31 NOTE — Progress Notes (Signed)
ANTICOAGULATION CONSULT NOTE Pharmacy Consult:  Heparin / Coumadin Indication: History of DVT  No Known Allergies  Patient Measurements: Height: 5\' 5"  (165.1 cm) Weight: 75.7 kg (166 lb 12.8 oz) IBW/kg (Calculated) : 61.5  Vital Signs: Temp: 98.3 F (36.8 C) (07/06 0315) Temp Source: Oral (07/06 0315) BP: 93/65 (07/06 0315) Pulse Rate: 62 (07/06 0315)  Labs: Recent Labs    01/29/21 0252 01/30/21 0301 01/30/21 0753 01/30/21 1555 01/31/21 0120  HGB 13.9 14.3  --   --   --   HCT 43.6 44.1  --   --   --   PLT 269 240  --   --   --   LABPROT 17.6* 18.0*  --   --  21.5*  INR 1.5* 1.5*  --   --  1.9*  HEPARINUNFRC 0.54 <0.10* <0.10* 0.12* 0.79*  CREATININE 1.83* 1.85*  --   --  1.94*     Estimated Creatinine Clearance: 36.1 mL/min (A) (by C-G formula based on SCr of 1.94 mg/dL (H)).   Assessment: 65 y.o. male with h/o DVT and Afib for anticoagulation  Goal of Therapy:  Heparin level 0.3-0.7 units/mL INR 2-3 Monitor platelets by anticoagulation protocol: Yes   Plan:  Decrease Heparin 1400 units/hr Coumadin 5 mg today  Phillis Knack, PharmD, BCPS  01/31/2021, 3:23 AM

## 2021-01-31 NOTE — Care Management Important Message (Signed)
Important Message  Patient Details  Name: Eugene [name] MRN: [id number] Date of Birth: May 02, 1956   Medicare Important Message Given:  Yes     [name] [place] 01/31/2021, 12:44 PM

## 2021-02-01 ENCOUNTER — Other Ambulatory Visit: Payer: Self-pay

## 2021-02-01 NOTE — Patient Outreach (Signed)
[facility]) Care Management  02/01/2021  Eugene [name] 05/12/1956 [id number]   Telephone call to patient for follow up from recent hospitalization. No answer.  HIPAA compliant voice message left.    Plan: RN CM will attempt patient again within 4 business days and send letter.   [name], RN, MSN [place] Management Care Management Coordinator Direct Line [phone] Cell [phone] Toll Free: [phone]  Fax: [phone]

## 2021-02-03 ENCOUNTER — Telehealth: Payer: Self-pay | Admitting: Internal Medicine

## 2021-02-03 NOTE — Telephone Encounter (Signed)
TRiage   Eugene [name] wa sin  hospital recently afte resp failure. Has ILD  Plan  - give 2 appts - one to see app in 7-14 days - anther to see me in sept 2022 - 30 min

## 2021-02-05 DIAGNOSIS — E1122 Type 2 diabetes mellitus with diabetic chronic kidney disease: Secondary | ICD-10-CM | POA: Diagnosis not present

## 2021-02-05 DIAGNOSIS — J849 Interstitial pulmonary disease, unspecified: Secondary | ICD-10-CM | POA: Diagnosis not present

## 2021-02-05 DIAGNOSIS — I4901 Ventricular fibrillation: Secondary | ICD-10-CM | POA: Diagnosis not present

## 2021-02-05 DIAGNOSIS — I130 Hypertensive heart and chronic kidney disease with heart failure and stage 1 through stage 4 chronic kidney disease, or unspecified chronic kidney disease: Secondary | ICD-10-CM | POA: Diagnosis not present

## 2021-02-05 DIAGNOSIS — J9601 Acute respiratory failure with hypoxia: Secondary | ICD-10-CM | POA: Diagnosis not present

## 2021-02-05 DIAGNOSIS — I480 Paroxysmal atrial fibrillation: Secondary | ICD-10-CM | POA: Diagnosis not present

## 2021-02-05 DIAGNOSIS — N184 Chronic kidney disease, stage 4 (severe): Secondary | ICD-10-CM | POA: Diagnosis not present

## 2021-02-05 DIAGNOSIS — I5043 Acute on chronic combined systolic (congestive) and diastolic (congestive) heart failure: Secondary | ICD-10-CM | POA: Diagnosis not present

## 2021-02-05 DIAGNOSIS — I25119 Atherosclerotic heart disease of native coronary artery with unspecified angina pectoris: Secondary | ICD-10-CM | POA: Diagnosis not present

## 2021-02-05 NOTE — Telephone Encounter (Signed)
Called patient but he did not answer. There are no app appts until the first week of August. Will leave in triage for follow up.

## 2021-02-06 ENCOUNTER — Other Ambulatory Visit: Payer: Self-pay

## 2021-02-06 DIAGNOSIS — E785 Hyperlipidemia, unspecified: Secondary | ICD-10-CM | POA: Diagnosis not present

## 2021-02-06 DIAGNOSIS — E1159 Type 2 diabetes mellitus with other circulatory complications: Secondary | ICD-10-CM | POA: Diagnosis not present

## 2021-02-06 DIAGNOSIS — J849 Interstitial pulmonary disease, unspecified: Secondary | ICD-10-CM | POA: Diagnosis not present

## 2021-02-06 DIAGNOSIS — N184 Chronic kidney disease, stage 4 (severe): Secondary | ICD-10-CM | POA: Diagnosis not present

## 2021-02-06 DIAGNOSIS — E0821 Diabetes mellitus due to underlying condition with diabetic nephropathy: Secondary | ICD-10-CM | POA: Diagnosis not present

## 2021-02-06 DIAGNOSIS — I2510 Atherosclerotic heart disease of native coronary artery without angina pectoris: Secondary | ICD-10-CM | POA: Diagnosis not present

## 2021-02-06 DIAGNOSIS — Z95810 Presence of automatic (implantable) cardiac defibrillator: Secondary | ICD-10-CM | POA: Diagnosis not present

## 2021-02-06 DIAGNOSIS — I5023 Acute on chronic systolic (congestive) heart failure: Secondary | ICD-10-CM | POA: Diagnosis not present

## 2021-02-06 DIAGNOSIS — E1351 Other specified diabetes mellitus with diabetic peripheral angiopathy without gangrene: Secondary | ICD-10-CM | POA: Diagnosis not present

## 2021-02-06 NOTE — Telephone Encounter (Signed)
I called and spoke with patient regarding MR message and scheduled patient with TP for hosp f/u on 02/28/21 and scheduled patient for ILD f/u on 04/11/21 in 30 min slot. Patient verbalized understanding, nothing further needed.

## 2021-02-06 NOTE — Patient Outreach (Signed)
[facility]) Care Management  [place]  02/06/2021   Eugene [name] 1956-02-23 [id number]  Subjective: Telephone call to patient for hospital follow up. Patient reports he is doing better.  Patient to see PCP for follow up today.  Patient lives in the home with spouse and is independent with care.  He states right now his wife is taking him to appointments but he is still able to drive.   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 fib status post AICD placement, interstitial lung disease, sleep apnea, and anxiety.  Patient with 10 year history of heart failure per patient.  Recent hospitalization for heart failure 01/27/21 to 01/31/21.    Discussed [facility] services and support with patient. He is agreeable to [facility] telephonic outreach for care coordination, education and support.    Objective:   Encounter Medications:  Outpatient Encounter Medications as of 02/06/2021  Medication Sig Note   clopidogrel (PLAVIX) 75 MG tablet Take 75 mg by mouth daily.    furosemide (LASIX) 40 MG tablet Take 40 mg by mouth daily.    guaiFENesin-dextromethorphan (ROBITUSSIN DM) 100-10 MG/5ML syrup Take 10 mLs by mouth every 6 (six) hours as needed for cough.    insulin aspart (NOVOLOG) 100 UNIT/ML injection Substitute to any brand approved.Before each meal 3 times a day, 140-199 - 2 units, 200-250 - 4 units, 251-299 - 6 units,  300-349 - 8 units,  350 or above 10 units. Dispense syringes and needles as needed, Ok to switch to PEN if approved. DX DM2, Code E11.65 (Patient taking differently: Substitute to any brand approved.Before each meal 3 times a day, 140-199 - 2 units, 200-250 - 4 units, 251-299 - 6 units,  300-349 - 8 units,  350 or above 10 units. Dispense syringes and needles as needed, Ok  to switch to PEN if approved. DX DM2, Code E11.65)    insulin glargine (LANTUS) 100 UNIT/ML Solostar Pen Inject 60 Units into the skin daily before breakfast.    Insulin Syringe-Needle U-100 25G X 1" 1 ML MISC For 4 times a day insulin SQ, 1 month supply. Diagnosis E11.65    metoprolol succinate (TOPROL-XL) 50 MG 24 hr tablet Take 1 tablet (50 mg total) by mouth in the morning and at bedtime. Take with or immediately following a meal.    mycophenolate (CELLCEPT) 500 MG tablet Take 1,000 mg by mouth 2 (two) times daily.    nitroGLYCERIN (NITROSTAT) 0.4 MG SL tablet PLACE 1 TABLET UNDER THE TONGUE EVERY 5 MINUTES AS NEEDED FOR CHEST PAIN    OZEMPIC, 1 MG/DOSE, 4 MG/3ML SOPN Inject 1 mg into the skin once a week.    pantoprazole (PROTONIX) 40 MG tablet Take 40 mg by mouth daily.    predniSONE (DELTASONE) 5 MG tablet Take 1 tablet (5 mg total) by mouth daily with breakfast.    ranolazine (RANEXA) 500 MG 12 hr tablet Take 1 tablet (500 mg total) by mouth 2 (two) times daily. TAKE ONE TABLET BY MOUTH TWICE DAILY.PLEASE MAKE APPT WITH DR.HARDING FOR REFILLS.    rosuvastatin (CRESTOR) 40 MG tablet Take 1 tablet (40 mg total) by mouth daily. 01/26/2021: Hasn't started.   vitamin B-12 (CYANOCOBALAMIN) 1000 MCG tablet TAKE 1 TABLET (1,000 MCG TOTAL) BY MOUTH DAILY.    warfarin (COUMADIN) 5 MG tablet Take 0.5-1 tablets (2.5-5  mg total) by mouth See admin instructions. Take 1 tablet (5 mg) daily except on Sun Take 1/2 tablet (2.5 mg)    No facility-administered encounter medications on file as of 02/06/2021.    Functional Status:  In your present state of health, do you have any difficulty performing the following activities: 02/06/2021 01/27/2021  Hearing? N N  Vision? N N  Difficulty concentrating or making decisions? N N  Walking or climbing stairs? N N  Dressing or bathing? N N  Doing errands, shopping? N N  Preparing Food and eating ? N -  Using the Toilet? N -  In the past six months, have you  accidently leaked urine? N -  Do you have problems with loss of bowel control? N -  Managing your Medications? N -  Managing your Finances? N -  Housekeeping or managing your Housekeeping? N -  Some recent data might be hidden    Fall/Depression Screening: Fall Risk  02/06/2021  Falls in the past year? 1  Number falls in past yr: 1  Injury with Fall? 0  Risk for fall due to : History of fall(s)  Follow up Falls prevention discussed   PHQ 2/9 Scores 02/06/2021  PHQ - 2 Score 0    Assessment:   Care Plan Care Plan : General Plan of Care (Adult)  Updates made by Eugene Billings, RN since 02/06/2021 12:00 AM     Problem: Therapeutic Alliance (General Plan of Care)      Goal: Therapeutic Alliance Established as evidenced by patient agreeing to work with CM   Start Date: 02/06/2021  Expected End Date: 03/28/2021  Priority: High  Note:   Evidence-based guidance:  Avoid value judgments; convey acceptance.  Encourage collaboration with the treatment team.  Establish rapport; develop trust relationship.  Therapist, art.  Provide emotional support; encourage patient to share feelings of anger, fear and anxiety.  Promote self-reliance and autonomy based on age and ability; discourage overprotection.  Use empathy and nonjudgmental, participatory manner.   Notes:  02/06/21 CM discussed [facility] services and support. Patient agreeable to outreach for support and education for disease management.      Task: Develop Relationship to Effect Behavior Change   Due Date: 03/28/2021  Priority: Routine  Responsible User: Eugene Billings, RN  Note:   Care Management Activities:    - acceptance conveyed - care explained - choices provided - collaboration with team encouraged - questions answered - reassurance provided    Notes:  02/06/21 Patient agreeable to CM services for post hospitalization follow up.      Care Plan : Heart Failure (Adult)  Updates made by Eugene Billings, RN since  02/06/2021 12:00 AM     Problem: Symptom Exacerbation (Heart Failure)      Goal: Symptom Exacerbation Prevented or Minimized as evidenced by no heart failure exacerbation.   Start Date: 02/06/2021  Expected End Date: 07/28/2021  Priority: High  Note:   Evidence-based guidance:  Perform or review cognitive and/or health literacy screening.  Assess understanding of adherence and barriers to treatment plan, as well as lifestyle changes; develop strategies to address barriers.  Establish a mutually-agreed-upon early intervention process to communicate with primary care provider when signs/symptoms worsen.  Notes:  02/06/21 Patient reports managing well since being home.  Weight 165.7 lbs today.      Task: Identify and Minimize Risk of Heart Failure Exacerbation   Due Date: 07/28/2021  Priority: Routine  Responsible User: Eugene Billings, RN  Note:  Care Management Activities:    - healthy lifestyle promoted - rescue (action) plan developed - self-awareness of signs/symptoms of worsening disease encouraged    Notes: 02/06/21 Patient weight 165.7 lbs today. Patient reports having permission from MD to take extra lasix if weight increases/swelling to feet and legs.   Heart Failure Management Discussed Please weight daily or as ordered by your doctor Report to your doctor weight gain of 2 -3 pounds in a day or 5 pounds in a week Limit salt intake Monitor for shortness of breath, swelling of feet, ankles or abdomen and weight gain. Never use the saltshaker.  Read all food labels and avoid canned, processed, and pickled foods.   Follow your doctor's recommendations for daily salt intake    Care Plan : Diabetes Type 2 (Adult)  Updates made by Eugene Billings, RN since 02/06/2021 12:00 AM     Problem: Glycemic Management (Diabetes, Type 2)      Long-Range Goal: Glycemic Management Optimized as evidenced by A1c lowering less than 8.5   Start Date: 02/06/2021  Expected End Date: 07/28/2021   Priority: High  Note:   Evidence-based guidance:  Anticipate A1C testing (point-of-care) every 3 to 6 months based on goal attainment.  Review mutually-set A1C goal or target range.  Anticipate use of antihyperglycemic with or without insulin and periodic adjustments; consider active involvement of pharmacist.  Provide medical nutrition therapy and development of individualized eating.  Compare self-reported symptoms of hypo or hyperglycemia to blood glucose levels, diet and fluid intake, current medications, psychosocial and physiologic stressors, change in activity and barriers to care adherence.  Promote self-monitoring of blood glucose levels.  Assess and address barriers to management plan, such as food insecurity, age, developmental ability, depression, anxiety, fear of hypoglycemia or weight gain, as well as medication cost, side effects and complicated regimen.   Notes:  Patient A1c 9. Patient sugars ranging 91-110. Discussed diabetes diet of low carbohydrates.      Task: Alleviate Barriers to Glycemic Management   Due Date: 07/28/2021  Priority: Routine  Responsible User: Eugene Billings, RN  Note:   Care Management Activities:    - blood glucose monitoring encouraged - blood glucose readings reviewed - self-awareness of signs/symptoms of hypo or hyperglycemia encouraged    Notes 02/06/21 Patient last A1c 9. Patient reports sugar ranging 91-110.   Diabetes Management Discussed: Medication adherence Reviewed importance of limiting carbs such as rice, potatoes, breads, and pastas. Also discussed limiting sweets and sugary drinks.  Discussed importance of portion control.  Also discussed importance of annual exams, foot exams, and eye exams.         Goals Addressed               This Visit's Progress     Monitor and Manage My Blood Sugar-Diabetes Type 2        Barriers: Health Behaviors Timeframe:  Long-Range Goal Priority:  High Start Date:      02/06/21                        Expected End Date:    07/28/21                   Follow Up Date 02/25/21   - check blood sugar at prescribed times - check blood sugar if I feel it is too high or too low - take the blood sugar log to all doctor visits    Why is this important?  Checking your blood sugar at home helps to keep it from getting very high or very low.  Writing the results in a diary or log helps the doctor know how to care for you.  Your blood sugar log should have the time, date and the results.  Also, write down the amount of insulin or other medicine that you take.  Other information, like what you ate, exercise done and how you were feeling, will also be helpful.     Notes:  02/06/21 Patient last A1c 9. Patient reports sugar ranging 91-110.   Diabetes Management Discussed: Medication adherence Reviewed importance of limiting carbs such as rice, potatoes, breads, and pastas. Also discussed limiting sweets and sugary drinks.  Discussed importance of portion control.  Also discussed importance of annual exams, foot exams, and eye exams.          Patient Stated " Gain my strength back" (pt-stated)        Barriers: Health Behaviors Timeframe:  Short-Term Goal Priority:  High Start Date:   02/06/21                          Expected End Date:     03/28/21  Follow up: 02/25/21  Patient want to gain his strength back.  Patient working with PT for strengthening a couple of times a week. Patient remaining active around the house. Patient using incentive spirometer to increase respiratory capacity.                           Track and Manage Symptoms-Heart Failure        Barriers: Health Behaviors Timeframe:  Long-Range Goal Priority:  High Start Date:      02/06/21                       Expected End Date:      07/28/21                 Follow Up Date 02/25/21   - develop a rescue plan - follow rescue plan if symptoms flare-up - know when to call the doctor    Why is this important?    You will be able to handle your symptoms better if you keep track of them.  Making some simple changes to your lifestyle will help.  Eating healthy is one thing you can do to take good care of yourself.    Notes:  02/06/21 Patient weight 165.7 lbs today. Patient reports having permission from MD to take extra lasix if weight increases/swelling to feet and legs.   Heart Failure Management Discussed Please weight daily or as ordered by your doctor Report to your doctor weight gain of 2 -3 pounds in a day or 5 pounds in a week Limit salt intake Monitor for shortness of breath, swelling of feet, ankles or abdomen and weight gain. Never use the saltshaker.  Read all food labels and avoid canned, processed, and pickled foods.   Follow your doctor's recommendations for daily salt intake         Plan: RN CM will provide ongoing education and support to patient through phone calls.   RN CM will send welcome packet with consent to patient.   RN CM will send initial barriers letter, assessment, and care plan to primary care physician.   Follow-up: Patient agrees to Care Plan and Follow-up. Follow-up in 2-3 week(s)  Eugene Baseman, RN, MSN [place] [facility] Care Management Coordinator Direct Line [phone] Cell [phone] Toll Free: [phone]  Fax: [phone]

## 2021-02-06 NOTE — Patient Instructions (Signed)
Goals Addressed               This Visit's Progress     Monitor and Manage My Blood Sugar-Diabetes Type 2        Barriers: Health Behaviors Timeframe:  Long-Range Goal Priority:  High Start Date:      02/06/21                       Expected End Date:    07/28/21                   Follow Up Date 02/25/21   - check blood sugar at prescribed times - check blood sugar if I feel it is too high or too low - take the blood sugar log to all doctor visits    Why is this important?   Checking your blood sugar at home helps to keep it from getting very high or very low.  Writing the results in a diary or log helps the doctor know how to care for you.  Your blood sugar log should have the time, date and the results.  Also, write down the amount of insulin or other medicine that you take.  Other information, like what you ate, exercise done and how you were feeling, will also be helpful.     Notes:  02/06/21 Patient last A1c 9. Patient reports sugar ranging 91-110.   Diabetes Management Discussed: Medication adherence Reviewed importance of limiting carbs such as rice, potatoes, breads, and pastas. Also discussed limiting sweets and sugary drinks.  Discussed importance of portion control.  Also discussed importance of annual exams, foot exams, and eye exams.          Patient Stated " Gain my strength back" (pt-stated)        Barriers: Health Behaviors Timeframe:  Short-Term Goal Priority:  High Start Date:   02/06/21                          Expected End Date:     03/28/21  Follow up: 02/25/21  Patient want to gain his strength back.  Patient working with PT for strengthening a couple of times a week. Patient remaining active around the house. Patient using incentive spirometer to increase respiratory capacity.                           Track and Manage Symptoms-Heart Failure        Barriers: Health Behaviors Timeframe:  Long-Range Goal Priority:  High Start Date:       02/06/21                       Expected End Date:      07/28/21                 Follow Up Date 02/25/21   - develop a rescue plan - follow rescue plan if symptoms flare-up - know when to call the doctor    Why is this important?   You will be able to handle your symptoms better if you keep track of them.  Making some simple changes to your lifestyle will help.  Eating healthy is one thing you can do to take good care of yourself.    Notes:  02/06/21 Patient weight 165.7 lbs today. Patient reports having permission from MD to take extra lasix if  weight increases/swelling to feet and legs.   Heart Failure Management Discussed Please weight daily or as ordered by your doctor Report to your doctor weight gain of 2 -3 pounds in a day or 5 pounds in a week Limit salt intake Monitor for shortness of breath, swelling of feet, ankles or abdomen and weight gain. Never use the saltshaker.  Read all food labels and avoid canned, processed, and pickled foods.   Follow your doctor's recommendations for daily salt intake

## 2021-02-07 ENCOUNTER — Ambulatory Visit (INDEPENDENT_AMBULATORY_CARE_PROVIDER_SITE_OTHER): Payer: Medicare HMO

## 2021-02-07 DIAGNOSIS — T466X5A Adverse effect of antihyperlipidemic and antiarteriosclerotic drugs, initial encounter: Secondary | ICD-10-CM

## 2021-02-07 DIAGNOSIS — I4901 Ventricular fibrillation: Secondary | ICD-10-CM

## 2021-02-07 DIAGNOSIS — G720 Drug-induced myopathy: Secondary | ICD-10-CM

## 2021-02-07 LAB — CUP PACEART REMOTE DEVICE CHECK
Battery Remaining Longevity: 22 mo
Battery Remaining Percentage: 18 %
Battery Voltage: 2.77 V
Brady Statistic RV Percent Paced: 1 %
Date Time Interrogation Session: 20220713033516
HighPow Impedance: 80 Ohm
HighPow Impedance: 80 Ohm
Implantable Lead Implant Date: 20120214
Implantable Lead Location: 753860
Implantable Lead Model: 7122
Implantable Pulse Generator Implant Date: 20120214
Lead Channel Impedance Value: 510 Ohm
Lead Channel Pacing Threshold Amplitude: 0.75 V
Lead Channel Pacing Threshold Pulse Width: 0.5 ms
Lead Channel Sensing Intrinsic Amplitude: 11.7 mV
Lead Channel Setting Pacing Amplitude: 2.5 V
Lead Channel Setting Pacing Pulse Width: 0.5 ms
Lead Channel Setting Sensing Sensitivity: 0.5 mV
Pulse Gen Serial Number: 819791

## 2021-02-08 DIAGNOSIS — N184 Chronic kidney disease, stage 4 (severe): Secondary | ICD-10-CM | POA: Diagnosis not present

## 2021-02-08 DIAGNOSIS — I5043 Acute on chronic combined systolic (congestive) and diastolic (congestive) heart failure: Secondary | ICD-10-CM | POA: Diagnosis not present

## 2021-02-08 DIAGNOSIS — I480 Paroxysmal atrial fibrillation: Secondary | ICD-10-CM | POA: Diagnosis not present

## 2021-02-08 DIAGNOSIS — I25119 Atherosclerotic heart disease of native coronary artery with unspecified angina pectoris: Secondary | ICD-10-CM | POA: Diagnosis not present

## 2021-02-08 DIAGNOSIS — E1122 Type 2 diabetes mellitus with diabetic chronic kidney disease: Secondary | ICD-10-CM | POA: Diagnosis not present

## 2021-02-08 DIAGNOSIS — J849 Interstitial pulmonary disease, unspecified: Secondary | ICD-10-CM | POA: Diagnosis not present

## 2021-02-08 DIAGNOSIS — J9601 Acute respiratory failure with hypoxia: Secondary | ICD-10-CM | POA: Diagnosis not present

## 2021-02-08 DIAGNOSIS — I130 Hypertensive heart and chronic kidney disease with heart failure and stage 1 through stage 4 chronic kidney disease, or unspecified chronic kidney disease: Secondary | ICD-10-CM | POA: Diagnosis not present

## 2021-02-08 DIAGNOSIS — I4901 Ventricular fibrillation: Secondary | ICD-10-CM | POA: Diagnosis not present

## 2021-02-12 DIAGNOSIS — I4901 Ventricular fibrillation: Secondary | ICD-10-CM | POA: Diagnosis not present

## 2021-02-12 DIAGNOSIS — E1122 Type 2 diabetes mellitus with diabetic chronic kidney disease: Secondary | ICD-10-CM | POA: Diagnosis not present

## 2021-02-12 DIAGNOSIS — I130 Hypertensive heart and chronic kidney disease with heart failure and stage 1 through stage 4 chronic kidney disease, or unspecified chronic kidney disease: Secondary | ICD-10-CM | POA: Diagnosis not present

## 2021-02-12 DIAGNOSIS — I25119 Atherosclerotic heart disease of native coronary artery with unspecified angina pectoris: Secondary | ICD-10-CM | POA: Diagnosis not present

## 2021-02-12 DIAGNOSIS — N184 Chronic kidney disease, stage 4 (severe): Secondary | ICD-10-CM | POA: Diagnosis not present

## 2021-02-12 DIAGNOSIS — J9601 Acute respiratory failure with hypoxia: Secondary | ICD-10-CM | POA: Diagnosis not present

## 2021-02-12 DIAGNOSIS — J849 Interstitial pulmonary disease, unspecified: Secondary | ICD-10-CM | POA: Diagnosis not present

## 2021-02-12 DIAGNOSIS — I480 Paroxysmal atrial fibrillation: Secondary | ICD-10-CM | POA: Diagnosis not present

## 2021-02-12 DIAGNOSIS — I5043 Acute on chronic combined systolic (congestive) and diastolic (congestive) heart failure: Secondary | ICD-10-CM | POA: Diagnosis not present

## 2021-02-14 DIAGNOSIS — E1122 Type 2 diabetes mellitus with diabetic chronic kidney disease: Secondary | ICD-10-CM | POA: Diagnosis not present

## 2021-02-14 DIAGNOSIS — I5043 Acute on chronic combined systolic (congestive) and diastolic (congestive) heart failure: Secondary | ICD-10-CM | POA: Diagnosis not present

## 2021-02-14 DIAGNOSIS — I4901 Ventricular fibrillation: Secondary | ICD-10-CM | POA: Diagnosis not present

## 2021-02-14 DIAGNOSIS — I25119 Atherosclerotic heart disease of native coronary artery with unspecified angina pectoris: Secondary | ICD-10-CM | POA: Diagnosis not present

## 2021-02-14 DIAGNOSIS — I480 Paroxysmal atrial fibrillation: Secondary | ICD-10-CM | POA: Diagnosis not present

## 2021-02-14 DIAGNOSIS — I130 Hypertensive heart and chronic kidney disease with heart failure and stage 1 through stage 4 chronic kidney disease, or unspecified chronic kidney disease: Secondary | ICD-10-CM | POA: Diagnosis not present

## 2021-02-14 DIAGNOSIS — J9601 Acute respiratory failure with hypoxia: Secondary | ICD-10-CM | POA: Diagnosis not present

## 2021-02-14 DIAGNOSIS — J849 Interstitial pulmonary disease, unspecified: Secondary | ICD-10-CM | POA: Diagnosis not present

## 2021-02-14 DIAGNOSIS — N184 Chronic kidney disease, stage 4 (severe): Secondary | ICD-10-CM | POA: Diagnosis not present

## 2021-02-19 DIAGNOSIS — J9601 Acute respiratory failure with hypoxia: Secondary | ICD-10-CM | POA: Diagnosis not present

## 2021-02-19 DIAGNOSIS — J849 Interstitial pulmonary disease, unspecified: Secondary | ICD-10-CM | POA: Diagnosis not present

## 2021-02-19 DIAGNOSIS — E1122 Type 2 diabetes mellitus with diabetic chronic kidney disease: Secondary | ICD-10-CM | POA: Diagnosis not present

## 2021-02-19 DIAGNOSIS — I130 Hypertensive heart and chronic kidney disease with heart failure and stage 1 through stage 4 chronic kidney disease, or unspecified chronic kidney disease: Secondary | ICD-10-CM | POA: Diagnosis not present

## 2021-02-19 DIAGNOSIS — I480 Paroxysmal atrial fibrillation: Secondary | ICD-10-CM | POA: Diagnosis not present

## 2021-02-19 DIAGNOSIS — N184 Chronic kidney disease, stage 4 (severe): Secondary | ICD-10-CM | POA: Diagnosis not present

## 2021-02-19 DIAGNOSIS — I4901 Ventricular fibrillation: Secondary | ICD-10-CM | POA: Diagnosis not present

## 2021-02-19 DIAGNOSIS — I5043 Acute on chronic combined systolic (congestive) and diastolic (congestive) heart failure: Secondary | ICD-10-CM | POA: Diagnosis not present

## 2021-02-19 DIAGNOSIS — I25119 Atherosclerotic heart disease of native coronary artery with unspecified angina pectoris: Secondary | ICD-10-CM | POA: Diagnosis not present

## 2021-02-20 ENCOUNTER — Encounter: Payer: Self-pay | Admitting: Internal Medicine

## 2021-02-20 ENCOUNTER — Other Ambulatory Visit: Payer: Self-pay

## 2021-02-20 ENCOUNTER — Ambulatory Visit: Payer: Medicare HMO | Admitting: Internal Medicine

## 2021-02-20 VITALS — BP 102/61 | HR 76 | Ht 65.0 in | Wt 166.8 lb

## 2021-02-20 DIAGNOSIS — Z95810 Presence of automatic (implantable) cardiac defibrillator: Secondary | ICD-10-CM

## 2021-02-20 DIAGNOSIS — I4901 Ventricular fibrillation: Secondary | ICD-10-CM | POA: Diagnosis not present

## 2021-02-20 DIAGNOSIS — I5022 Chronic systolic (congestive) heart failure: Secondary | ICD-10-CM

## 2021-02-20 DIAGNOSIS — I2510 Atherosclerotic heart disease of native coronary artery without angina pectoris: Secondary | ICD-10-CM

## 2021-02-20 NOTE — Progress Notes (Signed)
G

## 2021-02-20 NOTE — Progress Notes (Addendum)
HPI Eugene [name] returns today for followup. He has an ICM, VF ,s/p ICD insertion, pulmonary fibrosis on oxygen, dyslipidemia and HTN. He has had progressive dyspnea which is now class 3. He has retired from work. No chest pain. He has had a couple of syncopal episodes in the past few months due to VF No Known Allergies   Current Outpatient Medications  Medication Sig Dispense Refill   clopidogrel (PLAVIX) 75 MG tablet Take 75 mg by mouth daily.     furosemide (LASIX) 40 MG tablet Take 40 mg by mouth daily.     guaiFENesin-dextromethorphan (ROBITUSSIN DM) 100-10 MG/5ML syrup Take 10 mLs by mouth every 6 (six) hours as needed for cough. 118 mL 1   insulin aspart (NOVOLOG) 100 UNIT/ML injection Substitute to any brand approved.Before each meal 3 times a day, 140-199 - 2 units, 200-250 - 4 units, 251-299 - 6 units,  300-349 - 8 units,  350 or above 10 units. Dispense syringes and needles as needed, Ok to switch to PEN if approved. DX DM2, Code E11.65 (Patient taking differently: Substitute to any brand approved.Before each meal 3 times a day, 140-199 - 2 units, 200-250 - 4 units, 251-299 - 6 units,  300-349 - 8 units,  350 or above 10 units. Dispense syringes and needles as needed, Ok to switch to PEN if approved. DX DM2, Code E11.65) 10 mL 0   insulin glargine (LANTUS) 100 UNIT/ML Solostar Pen Inject 60 Units into the skin daily before breakfast.     Insulin Syringe-Needle U-100 25G X 1" 1 ML MISC For 4 times a day insulin SQ, 1 month supply. Diagnosis E11.65 30 each 0   metoprolol succinate (TOPROL-XL) 50 MG 24 hr tablet Take 1 tablet (50 mg total) by mouth in the morning and at bedtime. Take with or immediately following a meal. 180 tablet 1   mycophenolate (CELLCEPT) 500 MG tablet Take 1,000 mg by mouth 2 (two) times daily.     nitroGLYCERIN (NITROSTAT) 0.4 MG SL tablet PLACE 1 TABLET UNDER THE TONGUE EVERY 5 MINUTES AS NEEDED FOR CHEST PAIN 25 tablet 0   OZEMPIC, 1 MG/DOSE, 4 MG/3ML SOPN  Inject 1 mg into the skin once a week.     pantoprazole (PROTONIX) 40 MG tablet Take 40 mg by mouth daily.     predniSONE (DELTASONE) 5 MG tablet Take 1 tablet (5 mg total) by mouth daily with breakfast.     ranolazine (RANEXA) 500 MG 12 hr tablet Take 1 tablet (500 mg total) by mouth 2 (two) times daily. TAKE ONE TABLET BY MOUTH TWICE DAILY.PLEASE MAKE APPT WITH DR.HARDING FOR REFILLS. 60 tablet 0   rosuvastatin (CRESTOR) 40 MG tablet Take 1 tablet (40 mg total) by mouth daily. 90 tablet 3   vitamin B-12 (CYANOCOBALAMIN) 1000 MCG tablet TAKE 1 TABLET (1,000 MCG TOTAL) BY MOUTH DAILY. 30 tablet 0   warfarin (COUMADIN) 5 MG tablet Take 0.5-1 tablets (2.5-5 mg total) by mouth See admin instructions. Take 1 tablet (5 mg) daily except on Sun Take 1/2 tablet (2.5 mg)     No current facility-administered medications for this visit.     Past Medical History:  Diagnosis Date   Abnormal nuclear stress test 02/19/2018   Angina, class III ([place]) 02/19/2018   Anxiety    Chronic combined systolic and diastolic CHF (congestive heart failure) (HCC)    Chronic kidney disease, stage 3b (HCC)    Coronary artery disease    DDD (degenerative  disc disease), cervical 03/01/2013   Diabetes mellitus    type 2   Diverticular disease 01/12/2014   mild   DVT (deep venous thrombosis) (HCC)    Dyslipidemia    Hypertension    ICD (implantable cardioverter-defibrillator) in place    ILD (interstitial lung disease) ([place])    Lumbosacral radiculopathy at L4 02/18/2017   Myocardial infarction ([place]) 2012   Obesity    Snoring 10/12/2013   TIA (transient ischemic attack) 08/09/2014   Tubular adenoma of colon 01/12/2014   transverse colon   VF (ventricular fibrillation) ([place])    arrest   Vitamin D deficiency 02/18/2017    ROS:   All systems reviewed and negative except as noted in the HPI.   Past Surgical History:  Procedure Laterality Date   ABDOMINAL AORTAGRAM N/A 12/14/2013   Procedure: ABDOMINAL  AORTAGRAM;  Surgeon: [name], MD;  Location: [facility] CATH LAB;  Service: Cardiovascular;  Laterality: N/A;   CARDIAC CATHETERIZATION  09/16/10   COLONOSCOPY  01/12/2014   CORONARY ARTERY BYPASS GRAFT  2006   CORONARY BALLOON ANGIOPLASTY N/A 09/12/2020   Procedure: CORONARY BALLOON ANGIOPLASTY;  Surgeon: Troy Sine, MD;  Location: [facility] CV LAB;  Service: Cardiovascular;  Laterality: N/A;   CORONARY STENT INTERVENTION  07/06/2020   CORONARY STENT INTERVENTION N/A 07/06/2020   Procedure: CORONARY STENT INTERVENTION;  Surgeon: Nelva Bush, MD;  Location: [place] CV LAB;  Service: Cardiovascular;  Laterality: N/A;   IMPLANTABLE CARDIOVERTER DEFIBRILLATOR IMPLANT  2012   STJ ICD- single chamber   LEFT HEART CATH AND CORS/GRAFTS ANGIOGRAPHY N/A 02/19/2018   Procedure: LEFT HEART CATH AND CORS/GRAFTS ANGIOGRAPHY;  Surgeon: [name], MD;  Location: [place] CV LAB;  Service: Cardiovascular;  Laterality: N/A;   LEFT HEART CATH AND CORS/GRAFTS ANGIOGRAPHY N/A 07/06/2020   Procedure: LEFT HEART CATH AND CORS/GRAFTS ANGIOGRAPHY;  Surgeon: Nelva Bush, MD;  Location: [place] CV LAB;  Service: Cardiovascular;  Laterality: N/A;   LEFT HEART CATH AND CORS/GRAFTS ANGIOGRAPHY N/A 09/12/2020   Procedure: LEFT HEART CATH AND CORS/GRAFTS ANGIOGRAPHY;  Surgeon: Troy Sine, MD;  Location: [place] CV LAB;  Service: Cardiovascular;  Laterality: N/A;   LOOP RECORDER IMPLANT N/A 08/24/2014   Procedure: LOOP RECORDER IMPLANT;  Surgeon: [name], MD;  Location: [facility] CATH LAB;  Service: Cardiovascular;  Laterality: N/A;   MASS EXCISION  2008   right shoulder   MUSCLE BIOPSY Left 11/01/2020   Procedure: THIGH MUSCLE BIOPSY;  Surgeon: Georganna Skeans, MD;  Location: [facility] ORS;  Service: General;  Laterality: Left;   STERNECTOMY       Family History  Problem Relation Age of Onset   Breast cancer Mother 45       mets to colon and other   Glaucoma Mother    Colon cancer Mother     Coronary artery disease Father        early 32's   Diabetes Father    Hypertension Father    Diabetes Sister    Prostate cancer Brother    Heart disease Brother    Esophageal cancer Neg Hx    Pancreatic cancer Neg Hx    Stomach cancer Neg Hx    Liver disease Neg Hx      Social History   Socioeconomic History   Marital status: Single    Spouse name: Not on file   Number of children: 0   Years of education: Not on file   Highest education level: Not  on file  Occupational History   Occupation: STORE Programme researcher, broadcasting/film/video: [employer]    Comment: [place], [place]  Tobacco Use   Smoking status: Never   Smokeless tobacco: Never  Vaping Use   Vaping Use: Never used  Substance and Sexual Activity   Alcohol use: No   Drug use: No   Sexual activity: Not Currently  Other Topics Concern   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[place], [place]   6 caffeinated beverages daily   Social Determinants of Health   Financial Resource Strain: Not on file  Food Insecurity: Not on file  Transportation Needs: No Transportation Needs   Lack of Transportation (Medical): No   Lack of Transportation (Non-Medical): No  Physical Activity: Not on file  Stress: Not on file  Social Connections: Not on file  Intimate Partner Violence: Not on file     BP 102/61   Pulse 76   Ht 5\' 5"  (1.651 m)   Wt 166 lb 12.8 oz (75.7 kg)   SpO2 90%   BMI 27.76 kg/m   Physical Exam:  Chronically ill appearing NAD HEENT: Unremarkable Neck:  6 cm JVD, no thyromegally Lymphatics:  No adenopathy Back:  No CVA tenderness Lungs:  Clear with no wheezes HEART:  Regular rate rhythm, no murmurs, no rubs, no clicks Abd:  soft, positive bowel sounds, no organomegally, no rebound, no guarding Ext:  2 plus pulses, no edema, no cyanosis, no clubbing Skin:  No rashes no nodules Neuro:  CN II through XII intact, motor grossly intact  EKG - NSR  with non-specific STT changes  DEVICE  Normal device function.  See PaceArt for details.   Assess/Plan:  VF - we discussed the treatment options in detail. I would hold off on amio for now. ICM - he is s/p CABG and denies anginal symptoms.  ICD - his St. Jude single chamber device is working normally. Pulmonary fibrosis - he has lost weight and is on oxygen. His lung function is reduced.  Preoperative eval - he is an acceptable candidate to undergo colonoscopy. May hold warfarin up to 4 days prior. Restart warfarin the evening after the procedure.   Carleene Overlie Jimmye Wisnieski,MD

## 2021-02-20 NOTE — Patient Instructions (Addendum)
Medication Instructions:  Your physician recommends that you continue on your current medications as directed. Please refer to the Current Medication list given to you today.  Labwork: None ordered.  Testing/Procedures: None ordered.  Follow-Up: Your physician wants you to follow-up in: 6 months with Cristopher Peru, MD or one of the following Advanced Practice Providers on your designated Care Team:   Tommye Standard, [place] Legrand Como "Jonni Sanger" Chalmers Cater, [place]  Remote monitoring is used to monitor your ICD from home. This monitoring reduces the number of office visits required to check your device to one time per year. It allows [place] to keep an eye on the functioning of your device to ensure it is working properly. You are scheduled for a device check from home on 05/09/2021. You may send your transmission at any time that day. If you have a wireless device, the transmission will be sent automatically. After your physician reviews your transmission, you will receive a postcard with your next transmission date.  Any Other Special Instructions Will Be Listed Below (If Applicable).  If you need a refill on your cardiac medications before your next appointment, please call your pharmacy.

## 2021-02-21 DIAGNOSIS — I5043 Acute on chronic combined systolic (congestive) and diastolic (congestive) heart failure: Secondary | ICD-10-CM | POA: Diagnosis not present

## 2021-02-21 DIAGNOSIS — E1122 Type 2 diabetes mellitus with diabetic chronic kidney disease: Secondary | ICD-10-CM | POA: Diagnosis not present

## 2021-02-21 DIAGNOSIS — N184 Chronic kidney disease, stage 4 (severe): Secondary | ICD-10-CM | POA: Diagnosis not present

## 2021-02-21 DIAGNOSIS — I25119 Atherosclerotic heart disease of native coronary artery with unspecified angina pectoris: Secondary | ICD-10-CM | POA: Diagnosis not present

## 2021-02-21 DIAGNOSIS — I480 Paroxysmal atrial fibrillation: Secondary | ICD-10-CM | POA: Diagnosis not present

## 2021-02-21 DIAGNOSIS — J849 Interstitial pulmonary disease, unspecified: Secondary | ICD-10-CM | POA: Diagnosis not present

## 2021-02-21 DIAGNOSIS — J9601 Acute respiratory failure with hypoxia: Secondary | ICD-10-CM | POA: Diagnosis not present

## 2021-02-21 DIAGNOSIS — I130 Hypertensive heart and chronic kidney disease with heart failure and stage 1 through stage 4 chronic kidney disease, or unspecified chronic kidney disease: Secondary | ICD-10-CM | POA: Diagnosis not present

## 2021-02-21 DIAGNOSIS — I4901 Ventricular fibrillation: Secondary | ICD-10-CM | POA: Diagnosis not present

## 2021-02-22 ENCOUNTER — Other Ambulatory Visit: Payer: Self-pay

## 2021-02-22 NOTE — Patient Outreach (Signed)
[facility]) Care Management  [place]  02/22/2021   Eugene [name] 16-May-1956 [id number]  Subjective: Telephone call to patient for follow up. He reports he is doing ok, he is working to get his strength built back up.  He continues to work with PT and work on his breathing.  However, he reports oxygen saturations dropping at times. He has been scheduled to see the pulmonologist next week.  Reviewed heart failure and reviewed management.  He voices no concerns.   Objective:   Encounter Medications:  Outpatient Encounter Medications as of 02/22/2021  Medication Sig Note   clopidogrel (PLAVIX) 75 MG tablet Take 75 mg by mouth daily.    furosemide (LASIX) 40 MG tablet Take 40 mg by mouth daily.    guaiFENesin-dextromethorphan (ROBITUSSIN DM) 100-10 MG/5ML syrup Take 10 mLs by mouth every 6 (six) hours as needed for cough.    insulin aspart (NOVOLOG) 100 UNIT/ML injection Substitute to any brand approved.Before each meal 3 times a day, 140-199 - 2 units, 200-250 - 4 units, 251-299 - 6 units,  300-349 - 8 units,  350 or above 10 units. Dispense syringes and needles as needed, Ok to switch to PEN if approved. DX DM2, Code E11.65 (Patient taking differently: Substitute to any brand approved.Before each meal 3 times a day, 140-199 - 2 units, 200-250 - 4 units, 251-299 - 6 units,  300-349 - 8 units,  350 or above 10 units. Dispense syringes and needles as needed, Ok to switch to PEN if approved. DX DM2, Code E11.65)    insulin glargine (LANTUS) 100 UNIT/ML Solostar Pen Inject 60 Units into the skin daily before breakfast.    Insulin Syringe-Needle U-100 25G X 1" 1 ML MISC For 4 times a day insulin SQ, 1 month supply. Diagnosis E11.65    metoprolol succinate (TOPROL-XL) 50 MG 24 hr tablet Take 1 tablet (50 mg total) by mouth in the morning and at bedtime. Take with or immediately following a meal.    mycophenolate (CELLCEPT) 500 MG tablet Take 1,000 mg by mouth 2 (two) times  daily.    nitroGLYCERIN (NITROSTAT) 0.4 MG SL tablet PLACE 1 TABLET UNDER THE TONGUE EVERY 5 MINUTES AS NEEDED FOR CHEST PAIN    OZEMPIC, 1 MG/DOSE, 4 MG/3ML SOPN Inject 1 mg into the skin once a week.    pantoprazole (PROTONIX) 40 MG tablet Take 40 mg by mouth daily.    predniSONE (DELTASONE) 5 MG tablet Take 1 tablet (5 mg total) by mouth daily with breakfast.    ranolazine (RANEXA) 500 MG 12 hr tablet Take 1 tablet (500 mg total) by mouth 2 (two) times daily. TAKE ONE TABLET BY MOUTH TWICE DAILY.PLEASE MAKE APPT WITH DR.HARDING FOR REFILLS.    rosuvastatin (CRESTOR) 40 MG tablet Take 1 tablet (40 mg total) by mouth daily. 01/26/2021: Hasn't started.   vitamin B-12 (CYANOCOBALAMIN) 1000 MCG tablet TAKE 1 TABLET (1,000 MCG TOTAL) BY MOUTH DAILY.    warfarin (COUMADIN) 5 MG tablet Take 0.5-1 tablets (2.5-5 mg total) by mouth See admin instructions. Take 1 tablet (5 mg) daily except on Sun Take 1/2 tablet (2.5 mg)    No facility-administered encounter medications on file as of 02/22/2021.    Functional Status:  In your present state of health, do you have any difficulty performing the following activities: 02/06/2021 01/27/2021  Hearing? N N  Vision? N N  Difficulty concentrating or making decisions? N N  Walking or climbing stairs? N N  Dressing  or bathing? N N  Doing errands, shopping? N N  Preparing Food and eating ? N -  Using the Toilet? N -  In the past six months, have you accidently leaked urine? N -  Do you have problems with loss of bowel control? N -  Managing your Medications? N -  Managing your Finances? N -  Housekeeping or managing your Housekeeping? N -  Some recent data might be hidden    Fall/Depression Screening: Fall Risk  02/06/2021  Falls in the past year? 1  Number falls in past yr: 1  Injury with Fall? 0  Risk for fall due to : History of fall(s)  Follow up Falls prevention discussed   PHQ 2/9 Scores 02/06/2021  PHQ - 2 Score 0    Assessment:   Care  Plan Care Plan : General Plan of Care (Adult)  Updates made by Jon Billings, RN since 02/22/2021 12:00 AM     Problem: Therapeutic Alliance (General Plan of Care)      Goal: Therapeutic Alliance Established as evidenced by patient agreeing to work with CM   Start Date: 02/06/2021  Expected End Date: 03/28/2021  This Visit's Progress: On track  Priority: High  Note:   Evidence-based guidance:  Avoid value judgments; convey acceptance.  Encourage collaboration with the treatment team.  Establish rapport; develop trust relationship.  Therapist, art.  Provide emotional support; encourage patient to share feelings of anger, fear and anxiety.  Promote self-reliance and autonomy based on age and ability; discourage overprotection.  Use empathy and nonjudgmental, participatory manner.   Notes:  02/06/21 CM discussed [facility] services and support. Patient agreeable to outreach for support and education for disease management.   02/22/21 Patient continues engaging with CM on call.  Patient continues agreement for telephonic CM outreach for education and support.      Task: Develop Relationship to Effect Behavior Change   Due Date: 03/28/2021  Priority: Routine  Responsible User: Jon Billings, RN  Note:   Care Management Activities:    - collaboration with team encouraged - questions encouraged - reassurance provided    Notes:  02/06/21 Patient agreeable to CM services for post hospitalization follow up.   02/22/21 Patient continues to engage with CM for education and support follow ing hospitalization.  Encouraged patient to contact CM if questions or concerns.     Care Plan : Heart Failure (Adult)  Updates made by Jon Billings, RN since 02/22/2021 12:00 AM     Problem: Symptom Exacerbation (Heart Failure)      Long-Range Goal: Symptom Exacerbation Prevented or Minimized as evidenced by no heart failure exacerbation.   Start Date: 02/06/2021  Expected End Date: 07/28/2021  This  Visit's Progress: On track  Priority: High  Note:   Evidence-based guidance:  Establish a mutually-agreed-upon early intervention process to communicate with primary care provider when signs/symptoms worsen.  Notes:  02/06/21 Patient reports managing well since being home.  Weight 165.7 lbs today.   02/22/21 Patient doing well with heart failure management. Recent appointment with cardiology.  Weight 161 lbs.       Task: Identify and Minimize Risk of Heart Failure Exacerbation   Due Date: 07/28/2021  Priority: Routine  Responsible User: Jon Billings, RN  Note:   Care Management Activities:    - healthy lifestyle promoted - rescue (action) plan developed - self-awareness of signs/symptoms of worsening disease encouraged    Notes: 02/06/21 Patient weight 165.7 lbs today. Patient reports having permission from MD to  take extra lasix if weight increases/swelling to feet and legs.   Heart Failure Management Discussed Please weight daily or as ordered by your doctor Report to your doctor weight gain of 2 -3 pounds in a day or 5 pounds in a week Limit salt intake Monitor for shortness of breath, swelling of feet, ankles or abdomen and weight gain. Never use the saltshaker.  Read all food labels and avoid canned, processed, and pickled foods.   Follow your doctor's recommendations for daily salt intake. 02/22/21 Patient continues with heart failure management. Patient proudly states his weight is 161 lbs.  Encouraged to keep up the great work.      Care Plan : Diabetes Type 2 (Adult)  Updates made by Jon Billings, RN since 02/22/2021 12:00 AM     Problem: Glycemic Management (Diabetes, Type 2)      Long-Range Goal: Glycemic Management Optimized as evidenced by A1c lowering less than 8.5   Start Date: 02/06/2021  Expected End Date: 07/28/2021  This Visit's Progress: On track  Priority: High  Note:   Evidence-based guidance:  Anticipate use of antihyperglycemic with or without  insulin and periodic adjustments; consider active involvement of pharmacist.   Compare self-reported symptoms of hypo or hyperglycemia to blood glucose levels, diet and fluid intake, current medications, psychosocial and physiologic stressors, change in activity and barriers to care adherence.  Promote self-monitoring of blood glucose levels.  Assess and address barriers to management plan, such as food insecurity, age, developmental ability, depression, anxiety, fear of hypoglycemia or weight gain, as well as medication cost, side effects and complicated regimen.   Notes:  Patient A1c 9. Patient sugars ranging 91-110. Discussed diabetes diet of low carbohydrates.   02/22/21 Patient reports sugars lower with last report of 111.  Discussed limiting sugary drinks, rice, potatoes, pasta, and breads.      Task: Alleviate Barriers to Glycemic Management   Due Date: 07/28/2021  Priority: Routine  Responsible User: Jon Billings, RN  Note:   Care Management Activities:    - blood glucose monitoring encouraged - blood glucose readings reviewed    Notes 02/06/21 Patient last A1c 9. Patient reports sugar ranging 91-110.   Diabetes Management Discussed: Medication adherence Reviewed importance of limiting carbs such as rice, potatoes, breads, and pastas. Also discussed limiting sweets and sugary drinks.  Discussed importance of portion control.  Also discussed importance of annual exams, foot exams, and eye exams.   02/22/21 Last blood sugar 111 and patient pleased with sugars.  Reviewed diabetes management.         Goals Addressed               This Visit's Progress     Monitor and Manage My Blood Sugar-Diabetes Type 2   On track     Barriers: Health Behaviors Timeframe:  Long-Range Goal Priority:  High Start Date:      02/06/21                       Expected End Date:    07/28/21                   Follow Up Date 03/28/21  - check blood sugar at prescribed times - check blood sugar  if I feel it is too high or too low - take the blood sugar log to all doctor visits    Why is this important?   Checking your blood sugar at home helps to keep  it from getting very high or very low.  Writing the results in a diary or log helps the doctor know how to care for you.  Your blood sugar log should have the time, date and the results.  Also, write down the amount of insulin or other medicine that you take.  Other information, like what you ate, exercise done and how you were feeling, will also be helpful.     Notes:  02/06/21 Patient last A1c 9. Patient reports sugar ranging 91-110.   Diabetes Management Discussed: Medication adherence Reviewed importance of limiting carbs such as rice, potatoes, breads, and pastas. Also discussed limiting sweets and sugary drinks.  Discussed importance of portion control.  Also discussed importance of annual exams, foot exams, and eye exams.   02/22/21 Patient reports last sugar 111. Keep up the great work.  Reviewed diabetes management.         Patient Stated " Gain my strength back" (pt-stated)   On track     Barriers: Health Behaviors Timeframe:  Short-Term Goal Priority:  High Start Date:   02/06/21                          Expected End Date:    04/27/21  Follow up: 03/28/21 02/06/21 Patient want to gain his strength back.  Patient working with PT for strengthening a couple of times a week. Patient remaining active around the house. Patient using incentive spirometer to increase respiratory capacity.   02/22/21 Patient continues to work with PT at home for strengthening.   Patient continues using incentive  spirometer. Patient using oxygen at 3 liters.  Patient oxygen saturation anywhere from 58-95%. Patient reports oxygen saturation recovers quickly. Patient has appointment with pulmonology coming up next week.                        Track and Manage Symptoms-Heart Failure (pt-stated)   On track     Barriers: Health  Behaviors Timeframe:  Long-Range Goal Priority:  High Start Date:      02/06/21                       Expected End Date:      07/28/21                 Follow Up Date 03/28/21   - eat more whole grains, fruits and vegetables, lean meats and healthy fats - follow rescue plan if symptoms flare-up - know when to call the doctor    Why is this important?   You will be able to handle your symptoms better if you keep track of them.  Making some simple changes to your lifestyle will help.  Eating healthy is one thing you can do to take good care of yourself.    Notes:  02/06/21 Patient weight 165.7 lbs today. Patient reports having permission from MD to take extra lasix if weight increases/swelling to feet and legs.   Heart Failure Management Discussed Please weight daily or as ordered by your doctor Report to your doctor weight gain of 2 -3 pounds in a day or 5 pounds in a week Limit salt intake Monitor for shortness of breath, swelling of feet, ankles or abdomen and weight gain. Never use the saltshaker.  Read all food labels and avoid canned, processed, and pickled foods.   Follow your doctor's recommendations for daily salt intake. 02/22/21 Patient reports  weight 161 lbs.  No extra lasix dosages.  Recent visit with cardiologist went good.  No changes.  Reviewed heart failure management.         Plan:  Follow-up: Patient agrees to Care Plan and Follow-up. Follow-up in 2-3 week(s)  [name], RN, MSN [facility] Care Management Coordinator Direct Line [phone] Cell [phone] Toll Free: [phone]  Fax: [phone]

## 2021-02-22 NOTE — Patient Instructions (Signed)
Goals Addressed               This Visit's Progress     Monitor and Manage My Blood Sugar-Diabetes Type 2   On track     Barriers: Health Behaviors Timeframe:  Long-Range Goal Priority:  High Start Date:      02/06/21                       Expected End Date:    07/28/21                   Follow Up Date 03/28/21  - check blood sugar at prescribed times - check blood sugar if I feel it is too high or too low - take the blood sugar log to all doctor visits    Why is this important?   Checking your blood sugar at home helps to keep it from getting very high or very low.  Writing the results in a diary or log helps the doctor know how to care for you.  Your blood sugar log should have the time, date and the results.  Also, write down the amount of insulin or other medicine that you take.  Other information, like what you ate, exercise done and how you were feeling, will also be helpful.     Notes:  02/06/21 Patient last A1c 9. Patient reports sugar ranging 91-110.   Diabetes Management Discussed: Medication adherence Reviewed importance of limiting carbs such as rice, potatoes, breads, and pastas. Also discussed limiting sweets and sugary drinks.  Discussed importance of portion control.  Also discussed importance of annual exams, foot exams, and eye exams.   02/22/21 Patient reports last sugar 111. Keep up the great work.  Reviewed diabetes management.         Patient Stated " Gain my strength back" (pt-stated)   On track     Barriers: Health Behaviors Timeframe:  Short-Term Goal Priority:  High Start Date:   02/06/21                          Expected End Date:    04/27/21  Follow up: 03/28/21 02/06/21 Patient want to gain his strength back.  Patient working with PT for strengthening a couple of times a week. Patient remaining active around the house. Patient using incentive spirometer to increase respiratory capacity.   02/22/21 Patient continues to work with PT at home for  strengthening.   Patient continues using incentive  spirometer. Patient using oxygen at 3 liters.  Patient oxygen saturation anywhere from 58-95%. Patient reports oxygen saturation recovers quickly. Patient has appointment with pulmonology coming up next week.                        Track and Manage Symptoms-Heart Failure (pt-stated)   On track     Barriers: Health Behaviors Timeframe:  Long-Range Goal Priority:  High Start Date:      02/06/21                       Expected End Date:      07/28/21                 Follow Up Date 03/28/21   - eat more whole grains, fruits and vegetables, lean meats and healthy fats - follow rescue plan if symptoms flare-up - know when to call the  doctor    Why is this important?   You will be able to handle your symptoms better if you keep track of them.  Making some simple changes to your lifestyle will help.  Eating healthy is one thing you can do to take good care of yourself.    Notes:  02/06/21 Patient weight 165.7 lbs today. Patient reports having permission from MD to take extra lasix if weight increases/swelling to feet and legs.   Heart Failure Management Discussed Please weight daily or as ordered by your doctor Report to your doctor weight gain of 2 -3 pounds in a day or 5 pounds in a week Limit salt intake Monitor for shortness of breath, swelling of feet, ankles or abdomen and weight gain. Never use the saltshaker.  Read all food labels and avoid canned, processed, and pickled foods.   Follow your doctor's recommendations for daily salt intake. 02/22/21 Patient reports weight 161 lbs.  No extra lasix dosages.  Recent visit with cardiologist went good.  No changes.  Reviewed heart failure management.

## 2021-02-28 ENCOUNTER — Ambulatory Visit: Payer: Medicare HMO | Admitting: Adult Health

## 2021-02-28 ENCOUNTER — Encounter: Payer: Self-pay | Admitting: Adult Health

## 2021-02-28 ENCOUNTER — Other Ambulatory Visit: Payer: Self-pay

## 2021-02-28 VITALS — BP 112/62 | HR 67 | Temp 97.8°F | Ht 65.0 in | Wt 166.8 lb

## 2021-02-28 DIAGNOSIS — J9601 Acute respiratory failure with hypoxia: Secondary | ICD-10-CM | POA: Diagnosis not present

## 2021-02-28 DIAGNOSIS — E1122 Type 2 diabetes mellitus with diabetic chronic kidney disease: Secondary | ICD-10-CM | POA: Diagnosis not present

## 2021-02-28 DIAGNOSIS — N1832 Chronic kidney disease, stage 3b: Secondary | ICD-10-CM

## 2021-02-28 DIAGNOSIS — I5022 Chronic systolic (congestive) heart failure: Secondary | ICD-10-CM | POA: Diagnosis not present

## 2021-02-28 DIAGNOSIS — I480 Paroxysmal atrial fibrillation: Secondary | ICD-10-CM | POA: Diagnosis not present

## 2021-02-28 DIAGNOSIS — I4901 Ventricular fibrillation: Secondary | ICD-10-CM | POA: Diagnosis not present

## 2021-02-28 DIAGNOSIS — I25119 Atherosclerotic heart disease of native coronary artery with unspecified angina pectoris: Secondary | ICD-10-CM | POA: Diagnosis not present

## 2021-02-28 DIAGNOSIS — N184 Chronic kidney disease, stage 4 (severe): Secondary | ICD-10-CM | POA: Diagnosis not present

## 2021-02-28 DIAGNOSIS — J849 Interstitial pulmonary disease, unspecified: Secondary | ICD-10-CM | POA: Diagnosis not present

## 2021-02-28 DIAGNOSIS — I5043 Acute on chronic combined systolic (congestive) and diastolic (congestive) heart failure: Secondary | ICD-10-CM | POA: Diagnosis not present

## 2021-02-28 DIAGNOSIS — J9611 Chronic respiratory failure with hypoxia: Secondary | ICD-10-CM

## 2021-02-28 DIAGNOSIS — I130 Hypertensive heart and chronic kidney disease with heart failure and stage 1 through stage 4 chronic kidney disease, or unspecified chronic kidney disease: Secondary | ICD-10-CM | POA: Diagnosis not present

## 2021-02-28 NOTE — Progress Notes (Signed)
@Patient  ID: Eugene [name], male    DOB: 08-24-1955, 65 y.o.   MRN: [id number]  Chief Complaint  Patient presents with   Follow-up    Referring provider: [name], MD  HPI: 65 yo male never smoker seen for pulmonary consult 09/3020 for pulmonary fibrosis . Chronic Respiratory Failure started on O2 01/2021 from hospital stay . Felt to have pulmonary fibrosis possibly from autoimmune disease .  Followed by Rheumatology for inflammatory myopathy/myositis.  Medical history significant for Ischemic Cardiomyopathy, VF s/p ICD, CAD s/p CABG , CHF , CKD,, AFib on coumadin   TEST/EVENTS :   02/28/2021 Follow up : ILD  Patient returns for follow up visit. Last seen in March 2022 for pulmonary consult for Pulmonary fibrosis .  Felt that his interstitial lung disease may be secondary to his underlying autoimmune disorder with myositis.  He was recommended to return for a follow-up with PFTs.  And high-resolution CT chest however patient was recently hospitalized early July for decompensated congestive heart failure.  He had severe volume overload and required aggressive IV diuresis.  2D echo showed EF at 45 to 56%, grade 1 diastolic dysfunction.  He was started on oxygen at discharge.  Since discharge he is feeling some better.  Continues to get short of breath with minimum activity.  His leg swelling has decreased quite a bit.  And his weight is down.  He is currently on oxygen 2 L at rest and 4 L with activity.  Is beginning home physical therapy. He is not followed by rheumatology for inflammatory myopathy..  And has been started on CellCept.  And remains on prednisone 5 mg. He denies any hemoptysis chest pain , dysphagia, abdominal pain.    No Known Allergies  Immunization History  Administered Date(s) Administered   Influenza, Seasonal, Injecte, Preservative Fre 07/21/2013   Influenza,inj,Quad PF,6+ Mos 04/26/2015, 05/14/2016, 05/27/2017, 04/13/2018   Pneumococcal Polysaccharide-23  07/21/2013   Zoster Recombinat (Shingrix) 05/18/2019    Past Medical History:  Diagnosis Date   Abnormal nuclear stress test 02/19/2018   Angina, class III ([place]) 02/19/2018   Anxiety    Chronic combined systolic and diastolic CHF (congestive heart failure) (HCC)    Chronic kidney disease, stage 3b (HCC)    Coronary artery disease    DDD (degenerative disc disease), cervical 03/01/2013   Diabetes mellitus    type 2   Diverticular disease 01/12/2014   mild   DVT (deep venous thrombosis) (H[facility])    Dyslipidemia    Hypertension    ICD (implantable cardioverter-defibrillator) in place    ILD (interstitial lung disease) ([place])    Lumbosacral radiculopathy at L4 02/18/2017   Myocardial infarction ([place]) 2012   Obesity    Snoring 10/12/2013   TIA (transient ischemic attack) 08/09/2014   Tubular adenoma of colon 01/12/2014   transverse colon   VF (ventricular fibrillation) ([place])    arrest   Vitamin D deficiency 02/18/2017    Tobacco History: Social History   Tobacco Use  Smoking Status Never  Smokeless Tobacco Never   Counseling given: Not Answered   Outpatient Medications Prior to Visit  Medication Sig Dispense Refill   clopidogrel (PLAVIX) 75 MG tablet Take 75 mg by mouth daily.     furosemide (LASIX) 40 MG tablet Take 40 mg by mouth daily.     guaiFENesin-dextromethorphan (ROBITUSSIN DM) 100-10 MG/5ML syrup Take 10 mLs by mouth every 6 (six) hours as needed for cough. 118 mL 1   insulin aspart (NOVOLOG) 100  UNIT/ML injection Substitute to any brand approved.Before each meal 3 times a day, 140-199 - 2 units, 200-250 - 4 units, 251-299 - 6 units,  300-349 - 8 units,  350 or above 10 units. Dispense syringes and needles as needed, Ok to switch to PEN if approved. DX DM2, Code E11.65 (Patient taking differently: Substitute to any brand approved.Before each meal 3 times a day, 140-199 - 2 units, 200-250 - 4 units, 251-299 - 6 units,  300-349 - 8 units,  350 or above 10 units.  Dispense syringes and needles as needed, Ok to switch to PEN if approved. DX DM2, Code E11.65) 10 mL 0   insulin glargine (LANTUS) 100 UNIT/ML Solostar Pen Inject 60 Units into the skin daily before breakfast.     Insulin Syringe-Needle U-100 25G X 1" 1 ML MISC For 4 times a day insulin SQ, 1 month supply. Diagnosis E11.65 30 each 0   metoprolol succinate (TOPROL-XL) 50 MG 24 hr tablet Take 1 tablet (50 mg total) by mouth in the morning and at bedtime. Take with or immediately following a meal. 180 tablet 1   mycophenolate (CELLCEPT) 500 MG tablet Take 1,000 mg by mouth 2 (two) times daily.     nitroGLYCERIN (NITROSTAT) 0.4 MG SL tablet PLACE 1 TABLET UNDER THE TONGUE EVERY 5 MINUTES AS NEEDED FOR CHEST PAIN 25 tablet 0   OZEMPIC, 1 MG/DOSE, 4 MG/3ML SOPN Inject 1 mg into the skin once a week.     pantoprazole (PROTONIX) 40 MG tablet Take 40 mg by mouth daily.     predniSONE (DELTASONE) 5 MG tablet Take 1 tablet (5 mg total) by mouth daily with breakfast.     ranolazine (RANEXA) 500 MG 12 hr tablet Take 1 tablet (500 mg total) by mouth 2 (two) times daily. TAKE ONE TABLET BY MOUTH TWICE DAILY.PLEASE MAKE APPT WITH DR.HARDING FOR REFILLS. 60 tablet 0   rosuvastatin (CRESTOR) 40 MG tablet Take 1 tablet (40 mg total) by mouth daily. 90 tablet 3   vitamin B-12 (CYANOCOBALAMIN) 1000 MCG tablet TAKE 1 TABLET (1,000 MCG TOTAL) BY MOUTH DAILY. 30 tablet 0   warfarin (COUMADIN) 5 MG tablet Take 0.5-1 tablets (2.5-5 mg total) by mouth See admin instructions. Take 1 tablet (5 mg) daily except on Sun Take 1/2 tablet (2.5 mg)     No facility-administered medications prior to visit.     Review of Systems:   Constitutional:   No  weight loss, night sweats,  Fevers, chills,  +fatigue, or  lassitude.  HEENT:   No headaches,  Difficulty swallowing,  Tooth/dental problems, or  Sore throat,                No sneezing, itching, ear ache, nasal congestion, post nasal drip,   CV:  No chest pain,  Orthopnea, PND,  swelling in lower extremities, anasarca, dizziness, palpitations, syncope.   GI  No heartburn, indigestion, abdominal pain, nausea, vomiting, diarrhea, change in bowel habits, loss of appetite, bloody stools.   Resp: .  No chest wall deformity  Skin: no rash or lesions.  GU: no dysuria, change in color of urine, no urgency or frequency.  No flank pain, no hematuria   MS:  No joint pain or swelling.  No decreased range of motion.  No back pain.    Physical Exam  BP 112/62 (BP Location: Right Arm, Cuff Size: Normal)   Pulse 67   Temp 97.8 F (36.6 C) (Temporal)   Ht 5\' 5"  (1.651 m)  Wt 166 lb 12.8 oz (75.7 kg)   SpO2 90%   BMI 27.76 kg/m   GEN: A/Ox3; pleasant , NAD, well nourished    HEENT:  [place]/AT,  NOSE-clear, THROAT-clear, no lesions, no postnasal drip or exudate noted.   NECK:  Supple w/ fair ROM; no JVD; normal carotid impulses w/o bruits; no thyromegaly or nodules palpated; no lymphadenopathy.    RESP decreased breath sounds in the bases no accessory muscle use, no dullness to percussion  CARD:  RRR, no m/r/g, trace peripheral edema, pulses intact, no cyanosis or clubbing.  GI:   Soft & nt; nml bowel sounds; no organomegaly or masses detected.   Musco: Warm bil, no deformities or joint swelling noted.   Neuro: alert, no focal deficits noted.    Skin: Warm, no lesions or rashes    Lab Results:  CBC    Component Value Date/Time   WBC 12.4 (H) 01/30/2021 0301   RBC 4.99 01/30/2021 0301   HGB 14.3 01/30/2021 0301   HGB 12.1 (L) 09/11/2020 0439   HCT 44.1 01/30/2021 0301   HCT 37.8 07/05/2020 1019   PLT 240 01/30/2021 0301   PLT 226 07/05/2020 1019   MCV 88.4 01/30/2021 0301   MCV 85 07/05/2020 1019   MCH 28.7 01/30/2021 0301   MCHC 32.4 01/30/2021 0301   RDW 14.5 01/30/2021 0301   RDW 13.7 07/05/2020 1019   LYMPHSABS 2.2 01/30/2021 0301   MONOABS 1.0 01/30/2021 0301   EOSABS 0.4 01/30/2021 0301   BASOSABS 0.1 01/30/2021 0301    BMET     Component Value Date/Time   NA 134 (L) 01/31/2021 0120   NA 135 09/06/2020 0720   K 3.7 01/31/2021 0120   CL 100 01/31/2021 0120   CO2 25 01/31/2021 0120   GLUCOSE 124 (H) 01/31/2021 0120   BUN 26 (H) 01/31/2021 0120   BUN 41 (H) 09/06/2020 0720   CREATININE 1.94 (H) 01/31/2021 0120   CALCIUM 9.0 01/31/2021 0120   GFRNONAA 38 (L) 01/31/2021 0120   GFRAA 27 (L) 09/06/2020 0720    BNP    Component Value Date/Time   BNP 419.9 (H) 01/26/2021 1431    ProBNP    Component Value Date/Time   PROBNP 2,329 (H) 08/04/2020 1127   PROBNP <30.0 09/06/2010 1455    Imaging: CUP PACEART REMOTE DEVICE CHECK  Result Date: 02/07/2021 Scheduled remote reviewed. Normal device function.  Next remote 91 days.     No flowsheet data found.  No results found for: NITRICOXIDE      Assessment & Plan:   No problem-specific Assessment & Plan notes found for this encounter.    I spent  31  minutes dedicated to the care of this patient on the date of this encounter to include pre-visit review of records, face-to-face time with the patient discussing conditions above, post visit ordering of testing, clinical documentation with the electronic health record, making appropriate referrals as documented, and communicating necessary findings to members of the patients care team.   Rexene Edison, NP 02/28/2021

## 2021-02-28 NOTE — Patient Instructions (Addendum)
Continue on Oxygen 2l/m rest and 4 l/m w/ activity (goal O2 sats >88-90%)  Home PT as planned.  HRCT chest re: ILD  Continue follow up with Rheumatology.  Follow up with [name] ILD clinic with PFT (Spirometry with DLCO) 6 weeks  Please contact office for sooner follow up if symptoms do not improve or worsen or seek emergency care

## 2021-03-01 DIAGNOSIS — J9611 Chronic respiratory failure with hypoxia: Secondary | ICD-10-CM | POA: Insufficient documentation

## 2021-03-01 NOTE — Assessment & Plan Note (Signed)
Continue on oxygen 2 L with rest and 4 L with activity.  Goal is to have O2 saturations greater than 88 to 90%.  Plan Patient Instructions  Continue on Oxygen 2l/m rest and 4 l/m w/ activity (goal O2 sats >88-90%)  Home PT as planned.  HRCT chest re: ILD  Continue follow up with Rheumatology.  Follow up with [name] ILD clinic with PFT (Spirometry with DLCO) 6 weeks  Please contact office for sooner follow up if symptoms do not improve or worsen or seek emergency care

## 2021-03-01 NOTE — Assessment & Plan Note (Signed)
Interstitial lung disease with associated autoimmune disorder-inflammatory myopathy Continue with follow-up with rheumatology.  Continue on CellCept and prednisone Check high-resolution CT chest.  And spirometry with DLCO  Plan  Patient Instructions  Continue on Oxygen 2l/m rest and 4 l/m w/ activity (goal O2 sats >88-90%)  Home PT as planned.  HRCT chest re: ILD  Continue follow up with Rheumatology.  Follow up with [name] ILD clinic with PFT (Spirometry with DLCO) 6 weeks  Please contact office for sooner follow up if symptoms do not improve or worsen or seek emergency care

## 2021-03-01 NOTE — Assessment & Plan Note (Signed)
Continue with follow-up with primary care.

## 2021-03-01 NOTE — Assessment & Plan Note (Signed)
Appears euvolemic on exam.  Patient had recent hospitalization for decompensated congestive heart failure.  Continue follow-up with cardiology and current regimen

## 2021-03-02 DIAGNOSIS — J849 Interstitial pulmonary disease, unspecified: Secondary | ICD-10-CM | POA: Diagnosis not present

## 2021-03-02 DIAGNOSIS — E1122 Type 2 diabetes mellitus with diabetic chronic kidney disease: Secondary | ICD-10-CM | POA: Diagnosis not present

## 2021-03-02 DIAGNOSIS — I480 Paroxysmal atrial fibrillation: Secondary | ICD-10-CM | POA: Diagnosis not present

## 2021-03-02 DIAGNOSIS — N184 Chronic kidney disease, stage 4 (severe): Secondary | ICD-10-CM | POA: Diagnosis not present

## 2021-03-02 DIAGNOSIS — I4901 Ventricular fibrillation: Secondary | ICD-10-CM | POA: Diagnosis not present

## 2021-03-02 DIAGNOSIS — I130 Hypertensive heart and chronic kidney disease with heart failure and stage 1 through stage 4 chronic kidney disease, or unspecified chronic kidney disease: Secondary | ICD-10-CM | POA: Diagnosis not present

## 2021-03-02 DIAGNOSIS — I25119 Atherosclerotic heart disease of native coronary artery with unspecified angina pectoris: Secondary | ICD-10-CM | POA: Diagnosis not present

## 2021-03-02 DIAGNOSIS — I5043 Acute on chronic combined systolic (congestive) and diastolic (congestive) heart failure: Secondary | ICD-10-CM | POA: Diagnosis not present

## 2021-03-02 DIAGNOSIS — J9601 Acute respiratory failure with hypoxia: Secondary | ICD-10-CM | POA: Diagnosis not present

## 2021-03-02 NOTE — Progress Notes (Signed)
Remote ICD transmission.   

## 2021-03-03 DIAGNOSIS — I509 Heart failure, unspecified: Secondary | ICD-10-CM | POA: Diagnosis not present

## 2021-03-05 DIAGNOSIS — Z79899 Other long term (current) drug therapy: Secondary | ICD-10-CM | POA: Diagnosis not present

## 2021-03-05 DIAGNOSIS — I2510 Atherosclerotic heart disease of native coronary artery without angina pectoris: Secondary | ICD-10-CM | POA: Diagnosis not present

## 2021-03-05 DIAGNOSIS — M1990 Unspecified osteoarthritis, unspecified site: Secondary | ICD-10-CM | POA: Diagnosis not present

## 2021-03-05 DIAGNOSIS — I82409 Acute embolism and thrombosis of unspecified deep veins of unspecified lower extremity: Secondary | ICD-10-CM | POA: Diagnosis not present

## 2021-03-05 DIAGNOSIS — J849 Interstitial pulmonary disease, unspecified: Secondary | ICD-10-CM | POA: Diagnosis not present

## 2021-03-05 DIAGNOSIS — G7249 Other inflammatory and immune myopathies, not elsewhere classified: Secondary | ICD-10-CM | POA: Diagnosis not present

## 2021-03-05 DIAGNOSIS — M609 Myositis, unspecified: Secondary | ICD-10-CM | POA: Diagnosis not present

## 2021-03-05 DIAGNOSIS — I7300 Raynaud's syndrome without gangrene: Secondary | ICD-10-CM | POA: Diagnosis not present

## 2021-03-05 DIAGNOSIS — N1832 Chronic kidney disease, stage 3b: Secondary | ICD-10-CM | POA: Diagnosis not present

## 2021-03-06 DIAGNOSIS — J9601 Acute respiratory failure with hypoxia: Secondary | ICD-10-CM | POA: Diagnosis not present

## 2021-03-06 DIAGNOSIS — I25119 Atherosclerotic heart disease of native coronary artery with unspecified angina pectoris: Secondary | ICD-10-CM | POA: Diagnosis not present

## 2021-03-06 DIAGNOSIS — I130 Hypertensive heart and chronic kidney disease with heart failure and stage 1 through stage 4 chronic kidney disease, or unspecified chronic kidney disease: Secondary | ICD-10-CM | POA: Diagnosis not present

## 2021-03-06 DIAGNOSIS — E1122 Type 2 diabetes mellitus with diabetic chronic kidney disease: Secondary | ICD-10-CM | POA: Diagnosis not present

## 2021-03-06 DIAGNOSIS — N184 Chronic kidney disease, stage 4 (severe): Secondary | ICD-10-CM | POA: Diagnosis not present

## 2021-03-06 DIAGNOSIS — J849 Interstitial pulmonary disease, unspecified: Secondary | ICD-10-CM | POA: Diagnosis not present

## 2021-03-06 DIAGNOSIS — I480 Paroxysmal atrial fibrillation: Secondary | ICD-10-CM | POA: Diagnosis not present

## 2021-03-06 DIAGNOSIS — I5043 Acute on chronic combined systolic (congestive) and diastolic (congestive) heart failure: Secondary | ICD-10-CM | POA: Diagnosis not present

## 2021-03-06 DIAGNOSIS — I4901 Ventricular fibrillation: Secondary | ICD-10-CM | POA: Diagnosis not present

## 2021-03-07 DIAGNOSIS — E1122 Type 2 diabetes mellitus with diabetic chronic kidney disease: Secondary | ICD-10-CM | POA: Diagnosis not present

## 2021-03-07 DIAGNOSIS — I5043 Acute on chronic combined systolic (congestive) and diastolic (congestive) heart failure: Secondary | ICD-10-CM | POA: Diagnosis not present

## 2021-03-07 DIAGNOSIS — N184 Chronic kidney disease, stage 4 (severe): Secondary | ICD-10-CM | POA: Diagnosis not present

## 2021-03-07 DIAGNOSIS — I25119 Atherosclerotic heart disease of native coronary artery with unspecified angina pectoris: Secondary | ICD-10-CM | POA: Diagnosis not present

## 2021-03-07 DIAGNOSIS — I130 Hypertensive heart and chronic kidney disease with heart failure and stage 1 through stage 4 chronic kidney disease, or unspecified chronic kidney disease: Secondary | ICD-10-CM | POA: Diagnosis not present

## 2021-03-07 DIAGNOSIS — I4901 Ventricular fibrillation: Secondary | ICD-10-CM | POA: Diagnosis not present

## 2021-03-07 DIAGNOSIS — J9601 Acute respiratory failure with hypoxia: Secondary | ICD-10-CM | POA: Diagnosis not present

## 2021-03-07 DIAGNOSIS — I480 Paroxysmal atrial fibrillation: Secondary | ICD-10-CM | POA: Diagnosis not present

## 2021-03-07 DIAGNOSIS — J849 Interstitial pulmonary disease, unspecified: Secondary | ICD-10-CM | POA: Diagnosis not present

## 2021-03-08 ENCOUNTER — Other Ambulatory Visit: Payer: Self-pay

## 2021-03-08 NOTE — Patient Outreach (Signed)
[facility]) Care Management  [place]  03/08/2021   Eugene [name] 11-16-1955 [id number]  Subjective: Telephone call to patient for follow up. Patient reports he is doing ok.  PFT test has been ordered by pulmonologist. Encouraged continued disease management.     Objective:   Encounter Medications:  Outpatient Encounter Medications as of 03/08/2021  Medication Sig Note   clopidogrel (PLAVIX) 75 MG tablet Take 75 mg by mouth daily.    furosemide (LASIX) 40 MG tablet Take 40 mg by mouth daily.    guaiFENesin-dextromethorphan (ROBITUSSIN DM) 100-10 MG/5ML syrup Take 10 mLs by mouth every 6 (six) hours as needed for cough.    insulin aspart (NOVOLOG) 100 UNIT/ML injection Substitute to any brand approved.Before each meal 3 times a day, 140-199 - 2 units, 200-250 - 4 units, 251-299 - 6 units,  300-349 - 8 units,  350 or above 10 units. Dispense syringes and needles as needed, Ok to switch to PEN if approved. DX DM2, Code E11.65 (Patient taking differently: Substitute to any brand approved.Before each meal 3 times a day, 140-199 - 2 units, 200-250 - 4 units, 251-299 - 6 units,  300-349 - 8 units,  350 or above 10 units. Dispense syringes and needles as needed, Ok to switch to PEN if approved. DX DM2, Code E11.65)    insulin glargine (LANTUS) 100 UNIT/ML Solostar Pen Inject 60 Units into the skin daily before breakfast.    Insulin Syringe-Needle U-100 25G X 1" 1 ML MISC For 4 times a day insulin SQ, 1 month supply. Diagnosis E11.65    metoprolol succinate (TOPROL-XL) 50 MG 24 hr tablet Take 1 tablet (50 mg total) by mouth in the morning and at bedtime. Take with or immediately following a meal.    mycophenolate (CELLCEPT) 500 MG tablet Take 1,000 mg by mouth 2 (two) times daily.    nitroGLYCERIN (NITROSTAT) 0.4 MG SL tablet PLACE 1 TABLET UNDER THE TONGUE EVERY 5 MINUTES AS NEEDED FOR CHEST PAIN    OZEMPIC, 1 MG/DOSE, 4 MG/3ML SOPN Inject 1 mg into the skin once a week.     pantoprazole (PROTONIX) 40 MG tablet Take 40 mg by mouth daily.    predniSONE (DELTASONE) 5 MG tablet Take 1 tablet (5 mg total) by mouth daily with breakfast.    ranolazine (RANEXA) 500 MG 12 hr tablet Take 1 tablet (500 mg total) by mouth 2 (two) times daily. TAKE ONE TABLET BY MOUTH TWICE DAILY.PLEASE MAKE APPT WITH DR.HARDING FOR REFILLS.    rosuvastatin (CRESTOR) 40 MG tablet Take 1 tablet (40 mg total) by mouth daily. 01/26/2021: Hasn't started.   vitamin B-12 (CYANOCOBALAMIN) 1000 MCG tablet TAKE 1 TABLET (1,000 MCG TOTAL) BY MOUTH DAILY.    warfarin (COUMADIN) 5 MG tablet Take 0.5-1 tablets (2.5-5 mg total) by mouth See admin instructions. Take 1 tablet (5 mg) daily except on Sun Take 1/2 tablet (2.5 mg)    No facility-administered encounter medications on file as of 03/08/2021.    Functional Status:  In your present state of health, do you have any difficulty performing the following activities: 02/06/2021 01/27/2021  Hearing? N N  Vision? N N  Difficulty concentrating or making decisions? N N  Walking or climbing stairs? N N  Dressing or bathing? N N  Doing errands, shopping? N N  Preparing Food and eating ? N -  Using the Toilet? N -  In the past six months, have you accidently leaked urine? N -  Do  you have problems with loss of bowel control? N -  Managing your Medications? N -  Managing your Finances? N -  Housekeeping or managing your Housekeeping? N -  Some recent data might be hidden    Fall/Depression Screening: Fall Risk  02/06/2021  Falls in the past year? 1  Number falls in past yr: 1  Injury with Fall? 0  Risk for fall due to : History of fall(s)  Follow up Falls prevention discussed   PHQ 2/9 Scores 02/06/2021  PHQ - 2 Score 0    Assessment:   Care Plan Care Plan : General Plan of Care (Adult)  Updates made by Jon Billings, RN since 03/08/2021 12:00 AM     Problem: Therapeutic Alliance (General Plan of Care)      Goal: Therapeutic Alliance  Established as evidenced by patient agreeing to work with CM Completed 03/08/2021  Start Date: 02/06/2021  Expected End Date: 03/28/2021  Recent Progress: On track  Priority: High  Note:   Evidence-based guidance:  Avoid value judgments; convey acceptance.  Encourage collaboration with the treatment team.  Establish rapport; develop trust relationship.  Therapist, art.  Provide emotional support; encourage patient to share feelings of anger, fear and anxiety.  Promote self-reliance and autonomy based on age and ability; discourage overprotection.  Use empathy and nonjudgmental, participatory manner.   Notes:  02/06/21 CM discussed [facility] services and support. Patient agreeable to outreach for support and education for disease management.   02/22/21 Patient continues engaging with CM on call.  Patient continues agreement for telephonic CM outreach for education and support.      Task: Develop Relationship to Effect Behavior Change Completed 03/08/2021  Due Date: 03/28/2021  Priority: Routine  Responsible User: Jon Billings, RN  Note:   Care Management Activities:    - collaboration with team encouraged - questions encouraged - reassurance provided    Notes:  02/06/21 Patient agreeable to CM services for post hospitalization follow up.   02/22/21 Patient continues to engage with CM for education and support follow ing hospitalization.  Encouraged patient to contact CM if questions or concerns.     Care Plan : Heart Failure (Adult)  Updates made by Jon Billings, RN since 03/08/2021 12:00 AM     Problem: Symptom Exacerbation (Heart Failure)      Long-Range Goal: Symptom Exacerbation Prevented or Minimized as evidenced by no heart failure exacerbation.   Start Date: 02/06/2021  Expected End Date: 07/28/2021  Recent Progress: On track  Priority: High  Note:   Evidence-based guidance:  Establish a mutually-agreed-upon early intervention process to communicate with primary care  provider when signs/symptoms worsen.  Notes:  02/06/21 Patient reports managing well since being home.  Weight 165.7 lbs today.   02/22/21 Patient doing well with heart failure management. Recent appointment with cardiology.  Weight 161 lbs.    03/08/21 Patient weighs daily. Today's weight 163 lbs.     Task: Identify and Minimize Risk of Heart Failure Exacerbation   Due Date: 07/28/2021  Priority: Routine  Responsible User: Jon Billings, RN  Note:   Care Management Activities:    - healthy lifestyle promoted - rescue (action) plan developed - self-awareness of signs/symptoms of worsening disease encouraged    Notes: 02/06/21 Patient weight 165.7 lbs today. Patient reports having permission from MD to take extra lasix if weight increases/swelling to feet and legs.   Heart Failure Management Discussed Please weight daily or as ordered by your doctor Report to your doctor weight  gain of 2 -3 pounds in a day or 5 pounds in a week Limit salt intake Monitor for shortness of breath, swelling of feet, ankles or abdomen and weight gain. Never use the saltshaker.  Read all food labels and avoid canned, processed, and pickled foods.   Follow your doctor's recommendations for daily salt intake. 02/22/21 Patient continues with heart failure management. Patient proudly states his weight is 161 lbs.  Encouraged to keep up the great work.   03/08/21 Patient weighs daily. Today's weight 163 lbs. Discussed continued heart failure management.      Care Plan : Diabetes Type 2 (Adult)  Updates made by Jon Billings, RN since 03/08/2021 12:00 AM     Problem: Glycemic Management (Diabetes, Type 2)      Long-Range Goal: Glycemic Management Optimized as evidenced by A1c lowering less than 8.5   Start Date: 02/06/2021  Expected End Date: 07/28/2021  Recent Progress: On track  Priority: High  Note:   Evidence-based guidance:  Anticipate use of antihyperglycemic with or without insulin and periodic  adjustments; consider active involvement of pharmacist.   Compare self-reported symptoms of hypo or hyperglycemia to blood glucose levels, diet and fluid intake, current medications, psychosocial and physiologic stressors, change in activity and barriers to care adherence.  Promote self-monitoring of blood glucose levels.  Assess and address barriers to management plan, such as food insecurity, age, developmental ability, depression, anxiety, fear of hypoglycemia or weight gain, as well as medication cost, side effects and complicated regimen.   Notes:  Patient A1c 9. Patient sugars ranging 91-110. Discussed diabetes diet of low carbohydrates.   02/22/21 Patient reports sugars lower with last report of 111.  Discussed limiting sugary drinks, rice, potatoes, pasta, and breads.   03/08/21 Patient reports blood sugar doing good.  Patient did not report numbers. Encouraged continued diabetes management.    Task: Alleviate Barriers to Glycemic Management   Due Date: 07/28/2021  Priority: Routine  Responsible User: Jon Billings, RN  Note:   Care Management Activities:    - blood glucose monitoring encouraged - blood glucose readings reviewed    Notes 02/06/21 Patient last A1c 9. Patient reports sugar ranging 91-110.   Diabetes Management Discussed: Medication adherence Reviewed importance of limiting carbs such as rice, potatoes, breads, and pastas. Also discussed limiting sweets and sugary drinks.  Discussed importance of portion control.  Also discussed importance of annual exams, foot exams, and eye exams.   02/22/21 Last blood sugar 111 and patient pleased with sugars.  Reviewed diabetes management.   03/08/21 Patient reports blood sugar doing good.  Patient did not report numbers. Encouraged continued diabetes management.      Goals Addressed               This Visit's Progress     Monitor and Manage My Blood Sugar-Diabetes Type 2   On track     Barriers: Health  Behaviors Timeframe:  Long-Range Goal Priority:  High Start Date:      02/06/21                       Expected End Date:    07/28/21                   Follow Up Date 03/28/21  - check blood sugar at prescribed times - check blood sugar if I feel it is too high or too low - take the blood sugar log to all doctor visits  Why is this important?   Checking your blood sugar at home helps to keep it from getting very high or very low.  Writing the results in a diary or log helps the doctor know how to care for you.  Your blood sugar log should have the time, date and the results.  Also, write down the amount of insulin or other medicine that you take.  Other information, like what you ate, exercise done and how you were feeling, will also be helpful.     Notes:  02/06/21 Patient last A1c 9. Patient reports sugar ranging 91-110.   Diabetes Management Discussed: Medication adherence Reviewed importance of limiting carbs such as rice, potatoes, breads, and pastas. Also discussed limiting sweets and sugary drinks.  Discussed importance of portion control.  Also discussed importance of annual exams, foot exams, and eye exams.   02/22/21 Patient reports last sugar 111. Keep up the great work.  Reviewed diabetes management.   03/08/21 Patient reports sugars doing good.  No recent numbers reported.  Encouraged to continue diabetes management.       Patient Stated " Gain my strength back" (pt-stated)   On track     Barriers: Health Behaviors Timeframe:  Short-Term Goal Priority:  High Start Date:   02/06/21                          Expected End Date:    04/27/21  Follow up: 03/28/21 02/06/21 Patient want to gain his strength back.  Patient working with PT for strengthening a couple of times a week. Patient remaining active around the house. Patient using incentive spirometer to increase respiratory capacity.   02/22/21 Patient continues to work with PT at home for strengthening.   Patient  continues using incentive  spirometer. Patient using oxygen at 3 liters.  Patient oxygen saturation anywhere from 58-95%. Patient reports oxygen saturation recovers quickly. Patient has appointment with pulmonology coming up next week.                 03/08/21 Patient reports he saw the pulmonologist and PFT test ordered. Oxygen levels continue to drop with activity in the 80's.    Return visit scheduled in 2 weeks per patient.       Track and Manage Symptoms-Heart Failure (pt-stated)   On track     Barriers: Health Behaviors Timeframe:  Long-Range Goal Priority:  High Start Date:      02/06/21                       Expected End Date:      07/28/21                 Follow Up Date 03/28/21   - eat more whole grains, fruits and vegetables, lean meats and healthy fats - follow rescue plan if symptoms flare-up - know when to call the doctor    Why is this important?   You will be able to handle your symptoms better if you keep track of them.  Making some simple changes to your lifestyle will help.  Eating healthy is one thing you can do to take good care of yourself.    Notes:  02/06/21 Patient weight 165.7 lbs today. Patient reports having permission from MD to take extra lasix if weight increases/swelling to feet and legs.   Heart Failure Management Discussed Please weight daily or as ordered by your doctor Report to  your doctor weight gain of 2 -3 pounds in a day or 5 pounds in a week Limit salt intake Monitor for shortness of breath, swelling of feet, ankles or abdomen and weight gain. Never use the saltshaker.  Read all food labels and avoid canned, processed, and pickled foods.   Follow your doctor's recommendations for daily salt intake. 02/22/21 Patient reports weight 161 lbs.  No extra lasix dosages.  Recent visit with cardiologist went good.  No changes.  Reviewed heart failure management.  03/08/21 Patient weight 163 lbs. No swelling.  Discussed continued heart failure management.           Plan:  Follow-up: Patient agrees to Care Plan and Follow-up. Follow-up in 2-3 week(s)  [name], RN, MSN [place] [facility] Care Management Coordinator Direct Line [phone] Cell [phone] Toll Free: [phone]  Fax: [phone]

## 2021-03-13 ENCOUNTER — Telehealth: Payer: Self-pay

## 2021-03-13 NOTE — Telephone Encounter (Signed)
-----   Message from Gatha Mayer, MD sent at 03/13/2021 10:24 AM EDT ----- He is now on oxygen and had a lot of health problems.  He needs to cancel his colonoscopy and we need to set up a September or October visit in the office again ----- Message ----- From: [place], Adaysha Dubinsky E, [place]: 02/28/2021   5:03 PM EDT To: Gatha Mayer, MD  Hello [facility] had wanted to see how patient's visit with Dr Lovena Le went last week to help decide about his blood thinners.   Now it looks like to me that he is on O2 at 3 liters...Marland Kitchenhe currently has an appointment in the [facility] for a colon on 03/21/21.  Please advise, thank you.

## 2021-03-13 NOTE — Telephone Encounter (Signed)
I spoke with Eugene [name] and set him up an appointment to come October 19th at 1:30pm to see how he is doing at that point. I did confirm he is on Oxygen at nine liters currently. That will be added to his med list. I told him we have cancelled his colonoscopy for next week.

## 2021-03-13 NOTE — Telephone Encounter (Signed)
I have left him a message to call me back. I am going to go ahead and cancel this procedure next week.

## 2021-03-14 ENCOUNTER — Telehealth: Payer: Self-pay | Admitting: Internal Medicine

## 2021-03-14 DIAGNOSIS — J849 Interstitial pulmonary disease, unspecified: Secondary | ICD-10-CM | POA: Diagnosis not present

## 2021-03-14 DIAGNOSIS — I5043 Acute on chronic combined systolic (congestive) and diastolic (congestive) heart failure: Secondary | ICD-10-CM | POA: Diagnosis not present

## 2021-03-14 DIAGNOSIS — I25119 Atherosclerotic heart disease of native coronary artery with unspecified angina pectoris: Secondary | ICD-10-CM | POA: Diagnosis not present

## 2021-03-14 DIAGNOSIS — I4901 Ventricular fibrillation: Secondary | ICD-10-CM | POA: Diagnosis not present

## 2021-03-14 DIAGNOSIS — J9601 Acute respiratory failure with hypoxia: Secondary | ICD-10-CM | POA: Diagnosis not present

## 2021-03-14 DIAGNOSIS — N184 Chronic kidney disease, stage 4 (severe): Secondary | ICD-10-CM | POA: Diagnosis not present

## 2021-03-14 DIAGNOSIS — I480 Paroxysmal atrial fibrillation: Secondary | ICD-10-CM | POA: Diagnosis not present

## 2021-03-14 DIAGNOSIS — I130 Hypertensive heart and chronic kidney disease with heart failure and stage 1 through stage 4 chronic kidney disease, or unspecified chronic kidney disease: Secondary | ICD-10-CM | POA: Diagnosis not present

## 2021-03-14 DIAGNOSIS — E1122 Type 2 diabetes mellitus with diabetic chronic kidney disease: Secondary | ICD-10-CM | POA: Diagnosis not present

## 2021-03-14 NOTE — Telephone Encounter (Signed)
I have called and LM on VM for the pt to call us back.  

## 2021-03-14 NOTE — Telephone Encounter (Signed)
Called and spoke with patient. He confirmed that he is using 9L. He stated that at the last OV he was instructed to "do whatever is necessary to keep his O2 levels above 88%". In order to keep his sats above 88%, he has had to increase his O2 to 9L. He stated that his O2 machine does not appear to be working properly as when he went to switch out the tubing to the regular tubing, the concentrator stopped working for a few minutes but then came back on.   I advised him that I would call [employer] to check on the status of his service ticket and call him back. He verbalized understanding.   I called [employer] and spoke with [place]. She was able to review his account and stated that his current concentrator does not go up to 9L. She also stated that he had called twice, once on 8/15 to ask for a bigger concentrator. And once on 8/16 to ask for nasal cannulas and tubing. He never stated that his concentrator was not working properly.   I attempted to call the patient back again but he did not answer. I left a message for him to give our office a call back.

## 2021-03-14 NOTE — Telephone Encounter (Signed)
Pt stated that he was informed by Nurse Parrett to keep his O2 at 88% and above and currently it is running at 78-85% and he was just wanting to inform her and he said that he will be having the oxygen people to come and check it out for him. Pls regard; [phone].

## 2021-03-14 NOTE — Telephone Encounter (Signed)
[phone] calling back

## 2021-03-14 NOTE — Telephone Encounter (Signed)
At office visit on 8/3 was only on 2l/m rest and 4l/m activity  If he is requiring 9 l/m that is a big change  When is the DME coming out ?  If he is requiring that much oxygen needs to be seen or go to ER for further evaluation to determine why ?   Please contact office for sooner follow up if symptoms do not improve or worsen or seek emergency care    I am out of the office this evening , please send further messages regarding him to. [name] or Provider of the day .

## 2021-03-14 NOTE — Telephone Encounter (Signed)
Called and spoke with pt who states his O2 sats have been ranging recently from 78-85% on his 9L O2.  Pt said that usually it takes about 2 minutes for his sats to get back up to a stable range but for some reason this morning while PT was there, it took about 2 hours for sats to go back up.  Pt said that PT looked at his O2 tubing which is a 50-ft tubing and they think that there might be somethinr wrong with the line so the O2 company is coming out to look at the line.  Pt said that he was currently hooked up to the O2 machine instead of using the 50-ft tubing and said that he was currently satting at 89% on 9L O2.  Routing to TP as an FYI what has recently been happening.

## 2021-03-15 NOTE — Telephone Encounter (Signed)
Called and spoke with patient, I let him know that information that [employer] provided to [place] regarding his concentrator.  He states he got a new concentrator when he got out of the hospital on July 6th 2022.  He states it goes up to 10 L.  I let him know about the visit on Monday August 22nd as he was not aware.  Advised him that it would have to be a video visit, he has a smart phone, I advised him that he will be sent a link to the visit.  He verbalized understanding.  He states he has oxgen tubing on the way and there is nothing else he needs at this time.  He wanted to know if there was any medication that he could take for his condition.  I advised him that there were and that is something that he can discuss with TP at his visit.  Advised to call if there is anything else he needs at this time.  Nothing further needed.

## 2021-03-16 NOTE — Telephone Encounter (Signed)
The decline is alarmingly dangerous.  I would not hesitate to go to the emergency department in this case.  Many times will need IV therapy of steroids and Lasix to help the problem.  I am not even sure waiting till 03/19/2021 video visit is a good idea.  Plan - Recommend emergency department ([place] [phone] ) -either [facility] or [facility]  almost is gone.  Typically droppage has the shortest waiting time or med [employer].  He should indicate to the emergency room doctor that they should have a low threshold to admit   - Keep the video visit with Patricia Nettle on 03/19/2021  -Make face-to-face visit with me for 03/28/2021 4:15 PM

## 2021-03-16 NOTE — Telephone Encounter (Signed)
Called and spoke with patient. He verbalized understanding and stated that he really does not believe he needs to go to the ED. I advised him that he does need to go based on him using 9.5L of O2. He still refused and stated that he wanted to wait until his video visit on Monday. He is aware that if his breathing gets worse over the weekend to call 911.   Nothing further needed at time of call.

## 2021-03-16 NOTE — Telephone Encounter (Signed)
Spoke with Eugene [name] who stated he currently maintaining 90-95% O2 saturation on 9.5L of O2. Eugene [name] states that if he drops the liters down below 9.5L he falls below 88%. Eugene [name] does not c/o any other symptoms. Eugene [name] states he has had to be on 9.5L since returning home from the hospital in early July. Eugene [name] does think his concentrator is functioning properly at this time although no one from [employer] has been out to evaluate it. Eugene [name] refused offer made by RN to place work ticket with [employer] to have concentrator looked at. Eugene [name] instructed if he drops to 88% on 9.5L he should go to ED for further evaluation. Eugene [name] also questioning if there is any kind of medication he can take to help keep his saturation up. [name] please advise.

## 2021-03-16 NOTE — Telephone Encounter (Signed)
When he saw Patricia Nettle on 02/28/2021 was 2 L of oxygen at rest and 4 L with activity.  He is using he is on 9 L right now?  If he is worse and that worse he should consider going to the emergency room  Otherwise please talk with Ander Purpura and see if he can fit him in to see me directly between 03/26/2021 - 03/30/2021.  I can see him on the morning at 8:30 AM as long as there is no research visit or as an add-on or somewhere where the schedule works. Elise/Lauren can help

## 2021-03-16 NOTE — Telephone Encounter (Signed)
Called and spoke with patient and his brother.  He verified that when he saw Tammy on 8/3 he was using 2L at rest and 4L with exertion.  At rest he can tolerate 2-3L and sats will stay 88-90%.  As soon as he moves at all, even just repositioning himself in his recliner they drop to 80-85%, within a minute he recovers.  Before he gets up and walks, his sats are anywhere from 90-94%.  When he gets up to walk to the bathroom which is only about 15 feet on 9L his sats drop to 68% to 76% when he gets back from the bathroom and he is very sob.  At times he does get very sob with talking if he is talking for an extending period of time.  He is keeping the oxygen on 9L all the time because he never knows when he will have to get up or move.  He gets up during the night to go to the bathroom and would need it on 9L.  I provided the recommendations per [name] and they verbalized understanding.  They wanted to know if the CT scan could be moved up from 9/7.  I let them know I would check with our PCCs and see if that was a possibility and if it could be done one of our pccs would call them and let them know when it would be.  [name], Lauren said that only times we could schedule the patient would be on 8/31 or 9/1 at 4:15 pm.  He has a video visit with Tammy on 8/22 at 2 pm.  Thank you.

## 2021-03-19 ENCOUNTER — Telehealth: Payer: Medicare HMO | Admitting: Adult Health

## 2021-03-19 ENCOUNTER — Other Ambulatory Visit: Payer: Self-pay

## 2021-03-19 ENCOUNTER — Encounter: Payer: Self-pay | Admitting: Adult Health

## 2021-03-19 DIAGNOSIS — J849 Interstitial pulmonary disease, unspecified: Secondary | ICD-10-CM

## 2021-03-19 NOTE — Patient Instructions (Addendum)
Increase Prednisone 20mg  daily for 1 week , then 10mg  daily for 1 week and then 5mg  daily .  Office visit in 3 days  Oxygen sats goal >88-90% . Titrate Oxygen to maintain.  CT chest is planned this week Please contact office for sooner follow up if symptoms do not improve or worsen or seek emergency care

## 2021-03-19 NOTE — Progress Notes (Signed)
Virtual Visit via Video Note  I connected with Eugene [name] on 03/19/21 at  2:00 PM EDT by a video enabled telemedicine application and verified that I am speaking with the correct person using two identifiers.  Location: Patient: Home  Provider: Office    I discussed the limitations of evaluation and management by telemedicine and the availability of in person appointments. The patient expressed understanding and agreed to proceed.  History of Present Illness: 65 year old male never smoker seen for pulmonary consult March 2022 for pulmonary fibrosis. Chronic respiratory failure started on oxygen July 2022 after hospitalization.  He is felt to have pulmonary fibrosis possibly from autoimmune disease.  Followed by rheumatology for inflammatory myopathy/myositis Medical history significant for ischemic cardiomyopathy, V. fib status post ICD, coronary disease status post CABG, congestive heart failure, chronic kidney disease, A. fib on Coumadin  Today's video visit is an acute office visit.  Patient complains over the last 3 to 5 days he has had increased shortness of breath decreased activity tolerance and increased oxygen demands.  Previously required 2 L at rest and 4 L with activity.  Patient says now he is requiring 4 L at rest to maintain O2 saturations greater than 88 to 90%.  But when he walks he needs 9 L of oxygen to keep O2 saturations greater than 88%.  And at times it does drop down into the low 80s with walking on 9 L continuous flow. Had recent hospitalization in July for decompensated congestive heart failure.  2D echo showed a decreased EF at 45 to 50% and grade 1 diastolic dysfunction.  Patient says he has not had increased leg swelling and denies any orthopnea.  Patient is followed by rheumatology for inflammatory myopathy.  He is on CellCept and prednisone 5 mg.   Patient denies any hemoptysis, discolored mucus, chest pain, orthopnea.  He has a CT chest planned for later this week.   Patient is on Coumadin.  Is unsure of his last INR. Patient was recommended last week when he called into the office with his increased oxygen needs and increased symptoms.  To seek emergency room care.  Patient declines.  Once again today we talked about the  the dangers of ongoing hypoxia and inability to see patient in the office.  Patient has agreed that he will come into the office in 3 days when he goes for his CT scan.  Have advised him if his symptoms worsen or do not improve he will need to seek emergency room care.   Past Medical History:  Diagnosis Date   Abnormal nuclear stress test 02/19/2018   Angina, class III ([place]) 02/19/2018   Anxiety    Chronic combined systolic and diastolic CHF (congestive heart failure) (HCC)    Chronic kidney disease, stage 3b (HCC)    Coronary artery disease    DDD (degenerative disc disease), cervical 03/01/2013   Diabetes mellitus    type 2   Diverticular disease 01/12/2014   mild   DVT (deep venous thrombosis) (HCC)    Dyslipidemia    Hypertension    ICD (implantable cardioverter-defibrillator) in place    ILD (interstitial lung disease) ([place])    Lumbosacral radiculopathy at L4 02/18/2017   Myocardial infarction ([place]) 2012   Obesity    Snoring 10/12/2013   TIA (transient ischemic attack) 08/09/2014   Tubular adenoma of colon 01/12/2014   transverse colon   VF (ventricular fibrillation) ([place])    arrest   Vitamin D deficiency 02/18/2017  Current Outpatient Medications on File Prior to Visit  Medication Sig Dispense Refill   clopidogrel (PLAVIX) 75 MG tablet Take 75 mg by mouth daily.     furosemide (LASIX) 40 MG tablet Take 40 mg by mouth daily.     guaiFENesin-dextromethorphan (ROBITUSSIN DM) 100-10 MG/5ML syrup Take 10 mLs by mouth every 6 (six) hours as needed for cough. 118 mL 1   insulin aspart (NOVOLOG) 100 UNIT/ML injection Substitute to any brand approved.Before each meal 3 times a day, 140-199 - 2 units, 200-250 - 4 units,  251-299 - 6 units,  300-349 - 8 units,  350 or above 10 units. Dispense syringes and needles as needed, Ok to switch to PEN if approved. DX DM2, Code E11.65 (Patient taking differently: Substitute to any brand approved.Before each meal 3 times a day, 140-199 - 2 units, 200-250 - 4 units, 251-299 - 6 units,  300-349 - 8 units,  350 or above 10 units. Dispense syringes and needles as needed, Ok to switch to PEN if approved. DX DM2, Code E11.65) 10 mL 0   insulin glargine (LANTUS) 100 UNIT/ML Solostar Pen Inject 60 Units into the skin daily before breakfast.     Insulin Syringe-Needle U-100 25G X 1" 1 ML MISC For 4 times a day insulin SQ, 1 month supply. Diagnosis E11.65 30 each 0   metoprolol succinate (TOPROL-XL) 50 MG 24 hr tablet Take 1 tablet (50 mg total) by mouth in the morning and at bedtime. Take with or immediately following a meal. 180 tablet 1   mycophenolate (CELLCEPT) 500 MG tablet Take 1,000 mg by mouth 2 (two) times daily.     nitroGLYCERIN (NITROSTAT) 0.4 MG SL tablet PLACE 1 TABLET UNDER THE TONGUE EVERY 5 MINUTES AS NEEDED FOR CHEST PAIN 25 tablet 0   OXYGEN Inhale into the lungs. At 9 liters     OZEMPIC, 1 MG/DOSE, 4 MG/3ML SOPN Inject 1 mg into the skin once a week.     pantoprazole (PROTONIX) 40 MG tablet Take 40 mg by mouth daily.     predniSONE (DELTASONE) 5 MG tablet Take 1 tablet (5 mg total) by mouth daily with breakfast.     ranolazine (RANEXA) 500 MG 12 hr tablet Take 1 tablet (500 mg total) by mouth 2 (two) times daily. TAKE ONE TABLET BY MOUTH TWICE DAILY.PLEASE MAKE APPT WITH DR.HARDING FOR REFILLS. 60 tablet 0   rosuvastatin (CRESTOR) 40 MG tablet Take 1 tablet (40 mg total) by mouth daily. 90 tablet 3   vitamin B-12 (CYANOCOBALAMIN) 1000 MCG tablet TAKE 1 TABLET (1,000 MCG TOTAL) BY MOUTH DAILY. 30 tablet 0   warfarin (COUMADIN) 5 MG tablet Take 0.5-1 tablets (2.5-5 mg total) by mouth See admin instructions. Take 1 tablet (5 mg) daily except on Sun Take 1/2 tablet (2.5 mg)      No current facility-administered medications on file prior to visit.     Observations/Objective: Appears stable but chronically ill-appearing on oxygen sitting up at home.  Currently O2 saturations are 94% on 9 L.  Assessment and Plan: Interstitial lung disease with associated autoimmune disorder/inflammatory myopathy.-Appears to be having a flare with increased symptom burden and increased oxygen demands.  Will need a follow-up x-ray.  He does have a CT scan planned in 3 days. He is also to return to our office on that day for an evaluation and have lab work. Patient is on CellCept and prednisone.  We will also discuss beginning PJP prophylaxis with Bactrim  Plan  Patient  Instructions  Increase Prednisone 20mg  daily for 1 week , then 10mg  daily for 1 week and then 5mg  daily .  Office visit in 3 days  Oxygen sats goal >88-90% . Titrate Oxygen to maintain.  CT chest is planned this week Please contact office for sooner follow up if symptoms do not improve or worsen or seek emergency care     Chronic respiratory failure with increased oxygen demands.  Patient is to titrate oxygen to maintain O2 saturation greater than 88 to 90%.  Have advised him to use the lowest amount of oxygen to maintain the saturations.  At rest seems to be at 4 L he can keep O2 saturations greater than 88%.  And use 9 L with walking. Will reevaluate on return visit in 3 days.  Plan  Patient Instructions  Increase Prednisone 20mg  daily for 1 week , then 10mg  daily for 1 week and then 5mg  daily .  Office visit in 3 days  Oxygen sats goal >88-90% . Titrate Oxygen to maintain.  CT chest is planned this week Please contact office for sooner follow up if symptoms do not improve or worsen or seek emergency care      Follow Up Instructions:    I discussed the assessment and treatment plan with the patient. The patient was provided an opportunity to ask questions and all were answered. The patient agreed with  the plan and demonstrated an understanding of the instructions.   The patient was advised to call back or seek an in-person evaluation if the symptoms worsen or if the condition fails to improve as anticipated.  I provided 30  minutes of non-face-to-face time during this encounter.   Rexene Edison, NP

## 2021-03-21 ENCOUNTER — Ambulatory Visit: Payer: Medicare HMO | Admitting: Adult Health

## 2021-03-21 ENCOUNTER — Other Ambulatory Visit: Payer: Self-pay

## 2021-03-21 ENCOUNTER — Encounter: Payer: Medicare HMO | Admitting: Internal Medicine

## 2021-03-21 DIAGNOSIS — I480 Paroxysmal atrial fibrillation: Secondary | ICD-10-CM | POA: Diagnosis not present

## 2021-03-21 DIAGNOSIS — I25119 Atherosclerotic heart disease of native coronary artery with unspecified angina pectoris: Secondary | ICD-10-CM | POA: Diagnosis not present

## 2021-03-21 DIAGNOSIS — N184 Chronic kidney disease, stage 4 (severe): Secondary | ICD-10-CM | POA: Diagnosis not present

## 2021-03-21 DIAGNOSIS — J9601 Acute respiratory failure with hypoxia: Secondary | ICD-10-CM | POA: Diagnosis not present

## 2021-03-21 DIAGNOSIS — I4901 Ventricular fibrillation: Secondary | ICD-10-CM | POA: Diagnosis not present

## 2021-03-21 DIAGNOSIS — I130 Hypertensive heart and chronic kidney disease with heart failure and stage 1 through stage 4 chronic kidney disease, or unspecified chronic kidney disease: Secondary | ICD-10-CM | POA: Diagnosis not present

## 2021-03-21 DIAGNOSIS — I5043 Acute on chronic combined systolic (congestive) and diastolic (congestive) heart failure: Secondary | ICD-10-CM | POA: Diagnosis not present

## 2021-03-21 DIAGNOSIS — E1122 Type 2 diabetes mellitus with diabetic chronic kidney disease: Secondary | ICD-10-CM | POA: Diagnosis not present

## 2021-03-21 DIAGNOSIS — J849 Interstitial pulmonary disease, unspecified: Secondary | ICD-10-CM | POA: Diagnosis not present

## 2021-03-21 NOTE — Patient Outreach (Signed)
[facility]) Care Management  03/21/2021  DESMON HITCHNER 24-May-1956 [id number]   Telephone call to patient for follow up. No answer. HIPAA compliant voice message left.    Plan: RN CM will attempt patient again within 4 business days of no return call.   [name], RN, MSN [facility] Care Management Coordinator Direct Line [phone] Cell [phone] Toll Free: [phone]  Fax: [phone]

## 2021-03-22 ENCOUNTER — Ambulatory Visit: Payer: Medicare HMO | Admitting: Adult Health

## 2021-03-22 ENCOUNTER — Emergency Department (HOSPITAL_COMMUNITY): Payer: Medicare HMO

## 2021-03-22 ENCOUNTER — Encounter (HOSPITAL_COMMUNITY): Payer: Self-pay | Admitting: Internal Medicine

## 2021-03-22 ENCOUNTER — Other Ambulatory Visit: Payer: Self-pay

## 2021-03-22 ENCOUNTER — Inpatient Hospital Stay (HOSPITAL_COMMUNITY)
Admission: EM | Admit: 2021-03-22 | Discharge: 2021-03-26 | DRG: 196 | Disposition: A | Discharge status: Home Health | Payer: Medicare HMO | Source: Ambulatory Visit | Attending: Internal Medicine | Admitting: Internal Medicine

## 2021-03-22 ENCOUNTER — Encounter: Payer: Self-pay | Admitting: Adult Health

## 2021-03-22 ENCOUNTER — Ambulatory Visit (INDEPENDENT_AMBULATORY_CARE_PROVIDER_SITE_OTHER)
Admission: RE | Admit: 2021-03-22 | Discharge: 2021-03-22 | Disposition: A | Discharge status: Home / Self Care | Payer: Medicare HMO | Source: Ambulatory Visit | Attending: Adult Health | Admitting: Adult Health

## 2021-03-22 VITALS — BP 102/60 | HR 77 | Temp 97.6°F | Ht 65.0 in | Wt 167.0 lb

## 2021-03-22 DIAGNOSIS — Z9981 Dependence on supplemental oxygen: Secondary | ICD-10-CM

## 2021-03-22 DIAGNOSIS — R0600 Dyspnea, unspecified: Secondary | ICD-10-CM | POA: Diagnosis not present

## 2021-03-22 DIAGNOSIS — I2510 Atherosclerotic heart disease of native coronary artery without angina pectoris: Secondary | ICD-10-CM | POA: Diagnosis not present

## 2021-03-22 DIAGNOSIS — I214 Non-ST elevation (NSTEMI) myocardial infarction: Secondary | ICD-10-CM | POA: Diagnosis not present

## 2021-03-22 DIAGNOSIS — I255 Ischemic cardiomyopathy: Secondary | ICD-10-CM

## 2021-03-22 DIAGNOSIS — E872 Acidosis, unspecified: Secondary | ICD-10-CM | POA: Diagnosis present

## 2021-03-22 DIAGNOSIS — I5023 Acute on chronic systolic (congestive) heart failure: Secondary | ICD-10-CM | POA: Diagnosis present

## 2021-03-22 DIAGNOSIS — Z794 Long term (current) use of insulin: Secondary | ICD-10-CM | POA: Diagnosis not present

## 2021-03-22 DIAGNOSIS — Z79899 Other long term (current) drug therapy: Secondary | ICD-10-CM

## 2021-03-22 DIAGNOSIS — I4901 Ventricular fibrillation: Secondary | ICD-10-CM | POA: Diagnosis present

## 2021-03-22 DIAGNOSIS — I21A1 Myocardial infarction type 2: Secondary | ICD-10-CM | POA: Diagnosis present

## 2021-03-22 DIAGNOSIS — J849 Interstitial pulmonary disease, unspecified: Secondary | ICD-10-CM | POA: Diagnosis not present

## 2021-03-22 DIAGNOSIS — E785 Hyperlipidemia, unspecified: Secondary | ICD-10-CM | POA: Diagnosis present

## 2021-03-22 DIAGNOSIS — Z8673 Personal history of transient ischemic attack (TIA), and cerebral infarction without residual deficits: Secondary | ICD-10-CM

## 2021-03-22 DIAGNOSIS — E1165 Type 2 diabetes mellitus with hyperglycemia: Secondary | ICD-10-CM | POA: Diagnosis present

## 2021-03-22 DIAGNOSIS — J9611 Chronic respiratory failure with hypoxia: Secondary | ICD-10-CM

## 2021-03-22 DIAGNOSIS — I700 Atherosclerosis of aorta: Secondary | ICD-10-CM | POA: Diagnosis not present

## 2021-03-22 DIAGNOSIS — I480 Paroxysmal atrial fibrillation: Secondary | ICD-10-CM | POA: Diagnosis present

## 2021-03-22 DIAGNOSIS — R778 Other specified abnormalities of plasma proteins: Secondary | ICD-10-CM | POA: Diagnosis not present

## 2021-03-22 DIAGNOSIS — Z83511 Family history of glaucoma: Secondary | ICD-10-CM

## 2021-03-22 DIAGNOSIS — J84112 Idiopathic pulmonary fibrosis: Principal | ICD-10-CM | POA: Diagnosis present

## 2021-03-22 DIAGNOSIS — N1832 Chronic kidney disease, stage 3b: Secondary | ICD-10-CM

## 2021-03-22 DIAGNOSIS — J189 Pneumonia, unspecified organism: Secondary | ICD-10-CM | POA: Diagnosis not present

## 2021-03-22 DIAGNOSIS — Z955 Presence of coronary angioplasty implant and graft: Secondary | ICD-10-CM

## 2021-03-22 DIAGNOSIS — K5900 Constipation, unspecified: Secondary | ICD-10-CM | POA: Diagnosis not present

## 2021-03-22 DIAGNOSIS — Z20822 Contact with and (suspected) exposure to covid-19: Secondary | ICD-10-CM | POA: Diagnosis not present

## 2021-03-22 DIAGNOSIS — I10 Essential (primary) hypertension: Secondary | ICD-10-CM | POA: Diagnosis not present

## 2021-03-22 DIAGNOSIS — R531 Weakness: Secondary | ICD-10-CM

## 2021-03-22 DIAGNOSIS — R059 Cough, unspecified: Secondary | ICD-10-CM | POA: Diagnosis not present

## 2021-03-22 DIAGNOSIS — I252 Old myocardial infarction: Secondary | ICD-10-CM

## 2021-03-22 DIAGNOSIS — Z8249 Family history of ischemic heart disease and other diseases of the circulatory system: Secondary | ICD-10-CM

## 2021-03-22 DIAGNOSIS — D72829 Elevated white blood cell count, unspecified: Secondary | ICD-10-CM | POA: Diagnosis present

## 2021-03-22 DIAGNOSIS — Z8042 Family history of malignant neoplasm of prostate: Secondary | ICD-10-CM

## 2021-03-22 DIAGNOSIS — Z833 Family history of diabetes mellitus: Secondary | ICD-10-CM

## 2021-03-22 DIAGNOSIS — E869 Volume depletion, unspecified: Secondary | ICD-10-CM | POA: Diagnosis present

## 2021-03-22 DIAGNOSIS — Z803 Family history of malignant neoplasm of breast: Secondary | ICD-10-CM

## 2021-03-22 DIAGNOSIS — N179 Acute kidney failure, unspecified: Secondary | ICD-10-CM | POA: Diagnosis present

## 2021-03-22 DIAGNOSIS — Z7952 Long term (current) use of systemic steroids: Secondary | ICD-10-CM

## 2021-03-22 DIAGNOSIS — Z86718 Personal history of other venous thrombosis and embolism: Secondary | ICD-10-CM

## 2021-03-22 DIAGNOSIS — R7989 Other specified abnormal findings of blood chemistry: Secondary | ICD-10-CM | POA: Diagnosis present

## 2021-03-22 DIAGNOSIS — R9431 Abnormal electrocardiogram [ECG] [EKG]: Secondary | ICD-10-CM | POA: Diagnosis not present

## 2021-03-22 DIAGNOSIS — E876 Hypokalemia: Secondary | ICD-10-CM | POA: Diagnosis present

## 2021-03-22 DIAGNOSIS — I5022 Chronic systolic (congestive) heart failure: Secondary | ICD-10-CM

## 2021-03-22 DIAGNOSIS — T380X5A Adverse effect of glucocorticoids and synthetic analogues, initial encounter: Secondary | ICD-10-CM | POA: Diagnosis present

## 2021-03-22 DIAGNOSIS — Z800 Family history of malignant neoplasm of digestive organs: Secondary | ICD-10-CM

## 2021-03-22 DIAGNOSIS — R0602 Shortness of breath: Secondary | ICD-10-CM | POA: Diagnosis not present

## 2021-03-22 DIAGNOSIS — I5042 Chronic combined systolic (congestive) and diastolic (congestive) heart failure: Secondary | ICD-10-CM | POA: Diagnosis not present

## 2021-03-22 DIAGNOSIS — F411 Generalized anxiety disorder: Secondary | ICD-10-CM | POA: Diagnosis not present

## 2021-03-22 DIAGNOSIS — R739 Hyperglycemia, unspecified: Secondary | ICD-10-CM | POA: Diagnosis not present

## 2021-03-22 DIAGNOSIS — Z951 Presence of aortocoronary bypass graft: Secondary | ICD-10-CM

## 2021-03-22 DIAGNOSIS — Z7902 Long term (current) use of antithrombotics/antiplatelets: Secondary | ICD-10-CM

## 2021-03-22 DIAGNOSIS — R7889 Finding of other specified substances, not normally found in blood: Secondary | ICD-10-CM | POA: Diagnosis not present

## 2021-03-22 DIAGNOSIS — J479 Bronchiectasis, uncomplicated: Secondary | ICD-10-CM | POA: Diagnosis not present

## 2021-03-22 DIAGNOSIS — E1122 Type 2 diabetes mellitus with diabetic chronic kidney disease: Secondary | ICD-10-CM | POA: Diagnosis not present

## 2021-03-22 DIAGNOSIS — Z95810 Presence of automatic (implantable) cardiac defibrillator: Secondary | ICD-10-CM

## 2021-03-22 DIAGNOSIS — R0902 Hypoxemia: Secondary | ICD-10-CM | POA: Diagnosis not present

## 2021-03-22 DIAGNOSIS — I130 Hypertensive heart and chronic kidney disease with heart failure and stage 1 through stage 4 chronic kidney disease, or unspecified chronic kidney disease: Secondary | ICD-10-CM | POA: Diagnosis present

## 2021-03-22 DIAGNOSIS — Z7901 Long term (current) use of anticoagulants: Secondary | ICD-10-CM

## 2021-03-22 LAB — CBC WITH DIFFERENTIAL/PLATELET
Abs Immature Granulocytes: 0.06 10*3/uL (ref 0.00–0.07)
Basophils Absolute: 0 10*3/uL (ref 0.0–0.1)
Basophils Absolute: 0 10*3/uL (ref 0.0–0.1)
Basophils Relative: 0.1 % (ref 0.0–3.0)
Basophils Relative: 0 %
Eosinophils Absolute: 0 10*3/uL (ref 0.0–0.7)
Eosinophils Absolute: 0 10*3/uL (ref 0.0–0.5)
Eosinophils Relative: 0 % (ref 0.0–5.0)
Eosinophils Relative: 0 %
HCT: 35 % — ABNORMAL LOW (ref 39.0–52.0)
HCT: 38.6 % — ABNORMAL LOW (ref 39.0–52.0)
Hemoglobin: 11.3 g/dL — ABNORMAL LOW (ref 13.0–17.0)
Hemoglobin: 12.4 g/dL — ABNORMAL LOW (ref 13.0–17.0)
Immature Granulocytes: 0 %
Lymphocytes Relative: 3.4 % — ABNORMAL LOW (ref 12.0–46.0)
Lymphocytes Relative: 8 %
Lymphs Abs: 0.5 10*3/uL — ABNORMAL LOW (ref 0.7–4.0)
Lymphs Abs: 1 10*3/uL (ref 0.7–4.0)
MCH: 28.4 pg (ref 26.0–34.0)
MCHC: 32.4 g/dL (ref 30.0–36.0)
MCHC: 32.1 g/dL (ref 30.0–36.0)
MCV: 87.2 fl (ref 78.0–100.0)
MCV: 88.5 fL (ref 80.0–100.0)
Monocytes Absolute: 0.5 10*3/uL (ref 0.1–1.0)
Monocytes Absolute: 0.9 10*3/uL (ref 0.1–1.0)
Monocytes Relative: 3.5 % (ref 3.0–12.0)
Monocytes Relative: 7 %
Neutro Abs: 12.5 10*3/uL — ABNORMAL HIGH (ref 1.4–7.7)
Neutro Abs: 11.3 10*3/uL — ABNORMAL HIGH (ref 1.7–7.7)
Neutrophils Relative %: 93 % — ABNORMAL HIGH (ref 43.0–77.0)
Neutrophils Relative %: 85 %
Platelets: 353 10*3/uL (ref 150.0–400.0)
Platelets: 356 10*3/uL (ref 150–400)
RBC: 4.02 Mil/uL — ABNORMAL LOW (ref 4.22–5.81)
RBC: 4.36 MIL/uL (ref 4.22–5.81)
RDW: 16.3 % — ABNORMAL HIGH (ref 11.5–15.5)
RDW: 15.4 % (ref 11.5–15.5)
WBC: 13.5 10*3/uL — ABNORMAL HIGH (ref 4.0–10.5)
WBC: 13.4 10*3/uL — ABNORMAL HIGH (ref 4.0–10.5)
nRBC: 0 % (ref 0.0–0.2)

## 2021-03-22 LAB — COMPREHENSIVE METABOLIC PANEL
ALT: 53 U/L (ref 0–53)
ALT: 61 U/L — ABNORMAL HIGH (ref 0–44)
AST: 28 U/L (ref 0–37)
AST: 32 U/L (ref 15–41)
Albumin: 3.5 g/dL (ref 3.5–5.2)
Albumin: 3.3 g/dL — ABNORMAL LOW (ref 3.5–5.0)
Alkaline Phosphatase: 79 U/L (ref 39–117)
Alkaline Phosphatase: 76 U/L (ref 38–126)
Anion gap: 13 (ref 5–15)
BUN: 58 mg/dL — ABNORMAL HIGH (ref 6–23)
BUN: 56 mg/dL — ABNORMAL HIGH (ref 8–23)
CO2: 26 mEq/L (ref 19–32)
CO2: 27 mmol/L (ref 22–32)
Calcium: 9.1 mg/dL (ref 8.4–10.5)
Calcium: 9.4 mg/dL (ref 8.9–10.3)
Chloride: 92 mEq/L — ABNORMAL LOW (ref 96–112)
Chloride: 94 mmol/L — ABNORMAL LOW (ref 98–111)
Creatinine, Ser: 2.8 mg/dL — ABNORMAL HIGH (ref 0.40–1.50)
Creatinine, Ser: 2.83 mg/dL — ABNORMAL HIGH (ref 0.61–1.24)
GFR, Estimated: 24 mL/min — ABNORMAL LOW (ref >60)
GFR: 22.92 mL/min — ABNORMAL LOW (ref >60.00)
Glucose, Bld: 413 mg/dL — ABNORMAL HIGH (ref 70–99)
Glucose, Bld: 425 mg/dL — ABNORMAL HIGH (ref 70–99)
Potassium: 3.1 mEq/L — ABNORMAL LOW (ref 3.5–5.1)
Potassium: 2.8 mmol/L — ABNORMAL LOW (ref 3.5–5.1)
Sodium: 133 mEq/L — ABNORMAL LOW (ref 135–145)
Sodium: 134 mmol/L — ABNORMAL LOW (ref 135–145)
Total Bilirubin: 0.6 mg/dL (ref 0.2–1.2)
Total Bilirubin: 0.9 mg/dL (ref 0.3–1.2)
Total Protein: 6.4 g/dL (ref 6.0–8.3)
Total Protein: 6.3 g/dL — ABNORMAL LOW (ref 6.5–8.1)

## 2021-03-22 LAB — TROPONIN I (HIGH SENSITIVITY)
Troponin I (High Sensitivity): 1268 ng/L (ref <18)
Troponin I (High Sensitivity): 1218 ng/L (ref <18)

## 2021-03-22 LAB — RESP PANEL BY RT-PCR (FLU A&B, COVID) ARPGX2
Influenza A by PCR: NEGATIVE
Influenza B by PCR: NEGATIVE
SARS Coronavirus 2 by RT PCR: NEGATIVE

## 2021-03-22 LAB — PROTIME-INR
INR: 3.6 ratio — ABNORMAL HIGH (ref 0.8–1.0)
INR: 1.1 (ref 0.8–1.2)
INR: 2.5 — ABNORMAL HIGH (ref 0.8–1.2)
Prothrombin Time: 36.2 s — ABNORMAL HIGH (ref 9.6–13.1)
Prothrombin Time: 14.4 seconds (ref 11.4–15.2)
Prothrombin Time: 27.4 seconds — ABNORMAL HIGH (ref 11.4–15.2)

## 2021-03-22 LAB — LACTIC ACID, PLASMA
Lactic Acid, Venous: 2.4 mmol/L (ref 0.5–1.9)
Lactic Acid, Venous: 1.8 mmol/L (ref 0.5–1.9)

## 2021-03-22 LAB — BRAIN NATRIURETIC PEPTIDE
B Natriuretic Peptide: 715.9 pg/mL — ABNORMAL HIGH (ref 0.0–100.0)
Pro B Natriuretic peptide (BNP): 821 pg/mL — ABNORMAL HIGH (ref 0.0–100.0)

## 2021-03-22 MED ORDER — RANOLAZINE ER 500 MG PO TB12
500.0000 mg | ORAL_TABLET | Freq: Two times a day (BID) | ORAL | Status: DC
  Administered 2021-03-23 – 2021-03-26 (×8): 500 mg via ORAL
  Filled 2021-03-22 (×9): qty 1

## 2021-03-22 MED ORDER — INSULIN ASPART 100 UNIT/ML IJ SOLN
0.0000 [IU] | Freq: Three times a day (TID) | INTRAMUSCULAR | Status: DC
  Administered 2021-03-23: 5 [IU] via SUBCUTANEOUS
  Administered 2021-03-23 (×3): 3 [IU] via SUBCUTANEOUS
  Administered 2021-03-24: 2 [IU] via SUBCUTANEOUS
  Administered 2021-03-24: 3 [IU] via SUBCUTANEOUS

## 2021-03-22 MED ORDER — POLYETHYLENE GLYCOL 3350 17 G PO PACK
17.0000 g | PACK | Freq: Every day | ORAL | Status: DC | PRN
  Administered 2021-03-25: 17 g via ORAL
  Filled 2021-03-22: qty 1

## 2021-03-22 MED ORDER — CLOPIDOGREL BISULFATE 75 MG PO TABS
75.0000 mg | ORAL_TABLET | Freq: Every day | ORAL | Status: DC
  Administered 2021-03-23 – 2021-03-26 (×4): 75 mg via ORAL
  Filled 2021-03-22 (×4): qty 1

## 2021-03-22 MED ORDER — INSULIN GLARGINE-YFGN 100 UNIT/ML ~~LOC~~ SOLN
60.0000 [IU] | Freq: Every day | SUBCUTANEOUS | Status: DC
  Administered 2021-03-23 – 2021-03-26 (×4): 60 [IU] via SUBCUTANEOUS
  Filled 2021-03-22 (×6): qty 0.6

## 2021-03-22 MED ORDER — PREDNISONE 20 MG PO TABS: 20.0000 mg | ORAL_TABLET | Freq: Every day | ORAL | Status: DC

## 2021-03-22 MED ORDER — METHYLPREDNISOLONE SODIUM SUCC 125 MG IJ SOLR
125.0000 mg | Freq: Once | INTRAMUSCULAR | Status: AC
  Administered 2021-03-22: 125 mg via INTRAVENOUS
  Filled 2021-03-22: qty 2

## 2021-03-22 MED ORDER — ACETAMINOPHEN 325 MG PO TABS: 650.0000 mg | ORAL_TABLET | ORAL | Status: DC | PRN

## 2021-03-22 MED ORDER — PREDNISONE 10 MG PO TABS: ORAL_TABLET | ORAL | 1 refills | Status: DC

## 2021-03-22 MED ORDER — METOPROLOL SUCCINATE ER 50 MG PO TB24
50.0000 mg | ORAL_TABLET | Freq: Two times a day (BID) | ORAL | Status: DC
  Administered 2021-03-23 – 2021-03-26 (×7): 50 mg via ORAL
  Filled 2021-03-22 (×8): qty 1

## 2021-03-22 MED ORDER — ROSUVASTATIN CALCIUM 20 MG PO TABS
40.0000 mg | ORAL_TABLET | Freq: Every day | ORAL | Status: DC
  Administered 2021-03-23 – 2021-03-26 (×4): 40 mg via ORAL
  Filled 2021-03-22: qty 8
  Filled 2021-03-22 (×3): qty 2

## 2021-03-22 MED ORDER — PANTOPRAZOLE SODIUM 40 MG PO TBEC
40.0000 mg | DELAYED_RELEASE_TABLET | Freq: Every day | ORAL | Status: DC
  Administered 2021-03-23 – 2021-03-26 (×4): 40 mg via ORAL
  Filled 2021-03-22 (×5): qty 1

## 2021-03-22 MED ORDER — SODIUM CHLORIDE 0.9 % IV BOLUS
500.0000 mL | Freq: Once | INTRAVENOUS | Status: AC
  Administered 2021-03-22: 500 mL via INTRAVENOUS

## 2021-03-22 MED ORDER — SULFAMETHOXAZOLE-TRIMETHOPRIM 800-160 MG PO TABS: 1.0000 | ORAL_TABLET | ORAL | 1 refills | Status: AC

## 2021-03-22 MED ORDER — MYCOPHENOLATE MOFETIL 250 MG PO CAPS
1000.0000 mg | ORAL_CAPSULE | Freq: Two times a day (BID) | ORAL | Status: DC
  Administered 2021-03-23 – 2021-03-26 (×8): 1000 mg via ORAL
  Filled 2021-03-22 (×9): qty 4

## 2021-03-22 MED ORDER — NITROGLYCERIN 0.4 MG SL SUBL: 0.4000 mg | SUBLINGUAL_TABLET | SUBLINGUAL | Status: DC | PRN

## 2021-03-22 MED ORDER — SODIUM CHLORIDE 0.9% FLUSH
3.0000 mL | Freq: Two times a day (BID) | INTRAVENOUS | Status: DC
  Administered 2021-03-23 – 2021-03-26 (×5): 3 mL via INTRAVENOUS

## 2021-03-22 MED ORDER — ONDANSETRON HCL 4 MG/2ML IJ SOLN: 4.0000 mg | Freq: Four times a day (QID) | INTRAMUSCULAR | Status: DC | PRN

## 2021-03-22 MED ORDER — SODIUM CHLORIDE 0.9% FLUSH: 3.0000 mL | INTRAVENOUS | Status: DC | PRN

## 2021-03-22 MED ORDER — BENZONATATE 200 MG PO CAPS: 200.0000 mg | ORAL_CAPSULE | Freq: Three times a day (TID) | ORAL | 1 refills | Status: AC | PRN

## 2021-03-22 MED ORDER — SODIUM CHLORIDE 0.9 % IV SOLN: 250.0000 mL | INTRAVENOUS | Status: DC | PRN

## 2021-03-22 NOTE — ED Provider Notes (Signed)
[facility] EMERGENCY DEPARTMENT Provider Note   CSN: [id number] Arrival date & time: 03/22/21  1853     History Chief Complaint  Patient presents with   Shortness of Breath    SOB w/abnormal labs from PCP.     Eugene [name] is a 65 y.o. male.  Patient with history of pulmonary fibrosis on 2 L nasal cannula at baseline presents to the ER with chief complaint of continued shortness of breath and abnormal labs.  Patient states that he had seen his doctor earlier today and had blood drawn, was told later to go directly to the ER due to elevated creatinine.  Patient states that typically is on 2 L nasal cannula at home, however his shortness of breath is worsened and he has had to increase it to 5 to 6 L nasal cannula.  Otherwise denies any fevers.  Denies chest pain.  Has unchanged cough for 2 months.  No vomiting or diarrhea.      Past Medical History:  Diagnosis Date   Abnormal nuclear stress test 02/19/2018   Angina, class III ([place]) 02/19/2018   Anxiety    Chronic combined systolic and diastolic CHF (congestive heart failure) (HCC)    Chronic kidney disease, stage 3b (HCC)    Coronary artery disease    DDD (degenerative disc disease), cervical 03/01/2013   Diabetes mellitus    type 2   Diverticular disease 01/12/2014   mild   DVT (deep venous thrombosis) (HCC)    Dyslipidemia    Hypertension    ICD (implantable cardioverter-defibrillator) in place    ILD (interstitial lung disease) ([place])    Lumbosacral radiculopathy at L4 02/18/2017   Myocardial infarction ([place]) 2012   Obesity    Snoring 10/12/2013   TIA (transient ischemic attack) 08/09/2014   Tubular adenoma of colon 01/12/2014   transverse colon   VF (ventricular fibrillation) ([place])    arrest   Vitamin D deficiency 02/18/2017    Patient Active Problem List   Diagnosis Date Noted   Chronic respiratory failure with hypoxia ([place]) 03/01/2021   Acute exacerbation of CHF (congestive heart failure)  ([place]) 01/27/2021   Statin myopathy 10/03/2020   Hyperlipidemia associated with type 2 diabetes mellitus ([place]) 09/21/2020   History of COVID-19 09/21/2020   ILD (interstitial lung disease) ([place])    NSTEMI (non-ST elevated myocardial infarction) ([place]) 09/09/2020   Rhabdomyolysis 09/09/2020   Chronic kidney disease, stage 3b ([place]) 09/09/2020   Unintended weight loss 09/09/2020   Weakness of both legs 09/09/2020   Elevated LFTs 09/09/2020   Prolonged QT interval 09/09/2020   Weak urine stream 09/09/2020   Weakness    Unstable angina ([place]) 07/06/2020   H/O insulin dependent diabetes mellitus 06/27/2020   Loss of weight 06/27/2020   Constipation 06/27/2020   Vomiting 06/27/2020   History of colonic polyps 06/27/2020   Coronary artery disease involving coronary bypass graft of native heart with angina pectoris ([place]) 02/19/2018   Angina, class III ([place]) 02/19/2018   Abnormal nuclear stress test 02/19/2018   Diabetic polyneuropathy associated with type 2 diabetes mellitus ([place]) 02/18/2017   Lumbosacral radiculopathy at L4 02/18/2017   Vitamin D deficiency 02/18/2017   Gastroesophageal reflux disease without esophagitis 05/14/2016   Stasis ulcer of right ankle ([place]) 10/25/2014   TIA (transient ischemic attack) 08/09/2014   History of CVA (cerebrovascular accident) 06/02/2014   Family history of colon cancer in mother - 20 02/03/2014   Benign neoplasm of colon- adenoma 01/12/2014  Pain in joint, ankle and foot 11/30/2013   PAD (peripheral artery disease) ([place]) 11/30/2013   Snoring 10/12/2013   Hypersomnia 10/12/2013   Anxiety    Sleep apnea 08/17/2013   Hypertension associated with diabetes ([place]) 07/20/2013   Obesity 04/20/2013   CHF (congestive heart failure) ([place]) 03/10/2013   Ischemic heart disease due to coronary artery obstruction ([place]) 03/10/2013   Cervical spondylosis 03/01/2013   DDD (degenerative disc disease), cervical 03/01/2013   Single implantable  cardioverter-defibrillator (ICD) in situ 12/11/2010   Hyperlipidemia 09/27/2010   HYPERTENSION, BENIGN 09/27/2010   CAD, NATIVE VESSEL 09/27/2010   VENTRICULAR FIBRILLATION 09/27/2010    Past Surgical History:  Procedure Laterality Date   ABDOMINAL AORTAGRAM N/A 12/14/2013   Procedure: ABDOMINAL Maxcine Ham;  Surgeon: [name], MD;  Location: [facility] C[facility] LAB;  Service: Cardiovascular;  Laterality: N/A;   CARDIAC CATHETERIZATION  09/16/10   COLONOSCOPY  01/12/2014   CORONARY ARTERY BYPASS GRAFT  2006   CORONARY BALLOON ANGIOPLASTY N/A 09/12/2020   Procedure: CORONARY BALLOON ANGIOPLASTY;  Surgeon: Troy Sine, MD;  Location: [facility] CV LAB;  Service: Cardiovascular;  Laterality: N/A;   CORONARY STENT INTERVENTION  07/06/2020   CORONARY STENT INTERVENTION N/A 07/06/2020   Procedure: CORONARY STENT INTERVENTION;  Surgeon: Nelva Bush, MD;  Location: [facility] CV LAB;  Service: Cardiovascular;  Laterality: N/A;   IMPLANTABLE CARDIOVERTER DEFIBRILLATOR IMPLANT  2012   STJ ICD- single chamber   LEFT HEART CATH AND CORS/GRAFTS ANGIOGRAPHY N/A 02/19/2018   Procedure: LEFT HEART CATH AND CORS/GRAFTS ANGIOGRAPHY;  Surgeon: [name], MD;  Location: [facility] CV LAB;  Service: Cardiovascular;  Laterality: N/A;   LEFT HEART CATH AND CORS/GRAFTS ANGIOGRAPHY N/A 07/06/2020   Procedure: LEFT HEART CATH AND CORS/GRAFTS ANGIOGRAPHY;  Surgeon: Nelva Bush, MD;  Location: [facility] CV LAB;  Service: Cardiovascular;  Laterality: N/A;   LEFT HEART CATH AND CORS/GRAFTS ANGIOGRAPHY N/A 09/12/2020   Procedure: LEFT HEART CATH AND CORS/GRAFTS ANGIOGRAPHY;  Surgeon: Troy Sine, MD;  Location: [facility] CV LAB;  Service: Cardiovascular;  Laterality: N/A;   LOOP RECORDER IMPLANT N/A 08/24/2014   Procedure: LOOP RECORDER IMPLANT;  Surgeon: [name], MD;  Location: [facility] CATH LAB;  Service: Cardiovascular;  Laterality: N/A;   MASS EXCISION  2008   right shoulder   MUSCLE BIOPSY Left  11/01/2020   Procedure: THIGH MUSCLE BIOPSY;  Surgeon: Georganna Skeans, MD;  Location: [facility] ORS;  Service: General;  Laterality: Left;   STERNECTOMY         Family History  Problem Relation Age of Onset   Breast cancer Mother 31       mets to colon and other   Glaucoma Mother    Colon cancer Mother    Coronary artery disease Father        early 50's   Diabetes Father    Hypertension Father    Diabetes Sister    Prostate cancer Brother    Heart disease Brother    Esophageal cancer Neg Hx    Pancreatic cancer Neg Hx    Stomach cancer Neg Hx    Liver disease Neg Hx     Social History   Tobacco Use   Smoking status: Never   Smokeless tobacco: Never  Vaping Use   Vaping Use: Never used  Substance Use Topics   Alcohol use: No   Drug use: No    Home Medications Prior to Admission medications   Medication Sig Start Date End Date  Taking? Authorizing Provider  benzonatate (TESSALON) 200 MG capsule Take 1 capsule (200 mg total) by mouth 3 (three) times daily as needed for cough. 03/22/21 03/22/22  Parrett, Fonnie Mu, NP  clopidogrel (PLAVIX) 75 MG tablet Take 75 mg by mouth daily.    [provider]  furosemide (LASIX) 40 MG tablet Take 40 mg by mouth daily.    [provider]  guaiFENesin-dextromethorphan (ROBITUSSIN DM) 100-10 MG/5ML syrup Take 10 mLs by mouth every 6 (six) hours as needed for cough. 01/31/21   Shelly Coss, MD  insulin aspart (NOVOLOG) 100 UNIT/ML injection Substitute to any brand approved.Before each meal 3 times a day, 140-199 - 2 units, 200-250 - 4 units, 251-299 - 6 units,  300-349 - 8 units,  350 or above 10 units. Dispense syringes and needles as needed, Ok to switch to PEN if approved. DX DM2, Code E11.65 Patient taking differently: Substitute to any brand approved.Before each meal 3 times a day, 140-199 - 2 units, 200-250 - 4 units, 251-299 - 6 units,  300-349 - 8 units,  350 or above 10 units. Dispense syringes and needles as needed, Ok to  switch to PEN if approved. DX DM2, Code E11.65 09/16/20   Thurnell Lose, MD  insulin glargine (LANTUS) 100 UNIT/ML Solostar Pen Inject 60 Units into the skin daily before breakfast. 11/19/16   [provider]  Insulin Syringe-Needle U-100 25G X 1" 1 ML MISC For 4 times a day insulin SQ, 1 month supply. Diagnosis E11.65 09/16/20   Thurnell Lose, MD  metoprolol succinate (TOPROL-XL) 50 MG 24 hr tablet Take 1 tablet (50 mg total) by mouth in the morning and at bedtime. Take with or immediately following a meal. 11/16/20   Baldwin Jamaica, PA-C  mycophenolate (CELLCEPT) 500 MG tablet Take 1,000 mg by mouth 2 (two) times daily.    [provider]  nitroGLYCERIN (NITROSTAT) 0.4 MG SL tablet PLACE 1 TABLET UNDER THE TONGUE EVERY 5 MINUTES AS NEEDED FOR CHEST PAIN 11/03/20   Werner Lean, MD  OXYGEN Inhale into the lungs. At 9 liters    [provider]  OZEMPIC, 1 MG/DOSE, 4 MG/3ML SOPN Inject 1 mg into the skin once a week. 01/23/21   [provider]  pantoprazole (PROTONIX) 40 MG tablet Take 40 mg by mouth daily.    [provider]  predniSONE (DELTASONE) 10 MG tablet 2 tabs daily for 2 weeks then 1 1/2 tab daily for 2 weeks then 1 tab daily 03/22/21   Parrett, Tammy S, NP  predniSONE (DELTASONE) 5 MG tablet Take 1 tablet (5 mg total) by mouth daily with breakfast. Patient taking differently: Take 5 mg by mouth. Taking 20 mg daily 01/31/21   Shelly Coss, MD  ranolazine (RANEXA) 500 MG 12 hr tablet Take 1 tablet (500 mg total) by mouth 2 (two) times daily. TAKE ONE TABLET BY MOUTH TWICE DAILY.PLEASE MAKE APPT WITH DR.HARDING FOR REFILLS. 01/31/21   Shelly Coss, MD  rosuvastatin (CRESTOR) 40 MG tablet Take 1 tablet (40 mg total) by mouth daily. 01/02/21 04/02/21  Werner Lean, MD  sulfamethoxazole-trimethoprim (BACTRIM DS) 800-160 MG tablet Take 1 tablet by mouth 3 (three) times a week. 03/23/21   Parrett, Fonnie Mu, NP  vitamin B-12  (CYANOCOBALAMIN) 1000 MCG tablet TAKE 1 TABLET (1,000 MCG TOTAL) BY MOUTH DAILY. 09/15/20 09/15/21  Thurnell Lose, MD  warfarin (COUMADIN) 5 MG tablet Take 0.5-1 tablets (2.5-5 mg total) by mouth See admin instructions. Take  1 tablet (5 mg) daily except on Sun Take 1/2 tablet (2.5 mg) 01/31/21 01/31/22  Shelly Coss, MD    Allergies    Patient has no known allergies.  Review of Systems   Review of Systems  Constitutional:  Negative for fever.  HENT:  Negative for ear pain and sore throat.   Eyes:  Negative for pain.  Respiratory:  Positive for cough and shortness of breath.   Cardiovascular:  Negative for chest pain.  Gastrointestinal:  Negative for abdominal pain.  Genitourinary:  Negative for flank pain.  Musculoskeletal:  Negative for back pain.  Skin:  Negative for color change and rash.  Neurological:  Negative for syncope.  All other systems reviewed and are negative.  Physical Exam Updated Vital Signs BP 129/72   Pulse 76   Temp 97.9 F (36.6 C) (Oral)   Resp (!) 21   Ht 5\' 5"  (1.651 m)   Wt 75.8 kg   SpO2 95%   BMI 27.79 kg/m   Physical Exam Constitutional:      Appearance: He is well-developed.  HENT:     Head: Normocephalic.     Nose: Nose normal.  Eyes:     Extraocular Movements: Extraocular movements intact.  Cardiovascular:     Rate and Rhythm: Normal rate.  Pulmonary:     Breath sounds: Rhonchi present.  Skin:    Coloration: Skin is not jaundiced.  Neurological:     Mental Status: He is alert. Mental status is at baseline.    ED Results / Procedures / Treatments   Labs (all labs ordered are listed, but only abnormal results are displayed) Labs Reviewed  CBC WITH DIFFERENTIAL/PLATELET - Abnormal; Notable for the following components:      Result Value   WBC 13.4 (*)    Hemoglobin 12.4 (*)    HCT 38.6 (*)    Neutro Abs 11.3 (*)    All other components within normal limits  COMPREHENSIVE METABOLIC PANEL - Abnormal; Notable for the following  components:   Sodium 134 (*)    Potassium 2.8 (*)    Chloride 94 (*)    Glucose, Bld 425 (*)    BUN 56 (*)    Creatinine, Ser 2.83 (*)    Total Protein 6.3 (*)    Albumin 3.3 (*)    ALT 61 (*)    GFR, Estimated 24 (*)    All other components within normal limits  TROPONIN I (HIGH SENSITIVITY) - Abnormal; Notable for the following components:   Troponin I (High Sensitivity) 1,268 (*)    All other components within normal limits  CULTURE, BLOOD (ROUTINE X 2)  CULTURE, BLOOD (ROUTINE X 2)  RESP PANEL BY RT-PCR (FLU A&B, COVID) ARPGX2  BRAIN NATRIURETIC PEPTIDE  LACTIC ACID, PLASMA  LACTIC ACID, PLASMA  PROTIME-INR  URINALYSIS, ROUTINE W REFLEX MICROSCOPIC  TROPONIN I (HIGH SENSITIVITY)    EKG EKG Interpretation  Date/Time:  Thursday March 22 2021 19:58:54 EDT Ventricular Rate:  77 PR Interval:  217 QRS Duration: 129 QT Interval:  414 QTC Calculation: 469 R Axis:   -78 Text Interpretation: Sinus rhythm Borderline prolonged PR interval Left atrial enlargement RBBB and LAFB Abnrm T, consider ischemia, anterolateral lds Confirmed by Thamas Jaegers ([id number]) on 03/22/2021 8:28:00 PM  Radiology DG Chest Port 1 View  Result Date: 03/22/2021 CLINICAL DATA:  Worsening shortness of breath over the past week EXAM: PORTABLE CHEST 1 VIEW COMPARISON:  01/26/2021, chest CT from earlier in the same day. FINDINGS: Cardiac  shadow remains enlarged. Postsurgical changes are noted. Defibrillator and loop recorder are seen and stable. Diffuse chronic fibrotic changes are identified bilaterally although some suggestion of alveolar density is noted bilaterally likely representing some superimposed edema. No sizable effusion is noted. No bony abnormality is seen. IMPRESSION: Chronic fibrotic changes with mild superimposed edema. Electronically Signed   By: Inez Catalina M.D.   On: 03/22/2021 19:41    Procedures .Critical Care  Date/Time: 03/22/2021 8:47 PM Performed by: Luna Fuse, MD Authorized by:  Luna Fuse, MD   Critical care provider statement:    Critical care time (minutes):  30   Critical care time was exclusive of:  Separately billable procedures and treating other patients and teaching time   Critical care was necessary to treat or prevent imminent or life-threatening deterioration of the following conditions:  Cardiac failure and respiratory failure   Medications Ordered in ED Medications  sodium chloride 0.9 % bolus 500 mL (500 mLs Intravenous New Bag/Given 03/22/21 2006)    ED Course  I have reviewed the triage vital signs and the nursing notes.  Pertinent labs & imaging results that were available during my care of the patient were reviewed by me and considered in my medical decision making (see chart for details).    MDM Rules/Calculators/A&P                           Labs show elevated creatinine.  Potassium 2.8.  White count otherwise normal hemoglobin stable as well.  Troponin elevated 1200.  Chest x-ray shows fibrotic changes and pulmonary edema.  Patient continues have no complaints of chest pain.  Consultation with cardiology requested.  Hospitalists consulted for admission.   Final Clinical Impression(s) / ED Diagnoses Final diagnoses:  Shortness of breath  Elevated troponin    Rx / DC Orders ED Discharge Orders     None        Luna Fuse, MD 03/22/21 2047

## 2021-03-22 NOTE — Progress Notes (Signed)
@Patient  ID: Eugene [name], male    DOB: 03/29/1956, 65 y.o.   MRN: [id number]  Chief Complaint  Patient presents with   Follow-up    Referring provider: [name], MD  HPI: 65 year old male never smoker seen for pulmonary consult March 2022 for pulmonary fibrosis.  New onset chronic respiratory failure started on oxygen July 2022 from hospital stay He has felt to have pulmonary fibrosis possibly from autoimmune disease.  He is followed by rheumatology for inflammatory myopathy/myositis Medical history significant for ischemic cardiomyopathy, V. fib, status post ICD, coronary disease status post CABG, congestive heart failure, chronic kidney disease, A. fib on Coumadin   TEST/EVENTS :   03/22/2021 Follow up : ILD, O2 RF  Patient presents for a follow-up visit.  He has followed for interstitial lung disease most likely secondary to underlying autoimmune disorder with myositis/myopathy.  Patient was recently hospitalized in July 2022 for decompensated congestive heart failure.  He had aggressive IV diuresis.  2D echo showed EF at 45 to 33%, grade 1 diastolic dysfunction.  He did require oxygen at discharge.  He was discharged on 2 L of oxygen at rest and 4 L with activity.  He was also recommended for home physical therapy. He has maintained on CellCept.  And prednisone 5 mg. Patient was seen via video visit March 19, 2021 for increased shortness of breath decreased activity tolerance and increased oxygen demands.  Patient now requiring 4 L of oxygen at rest.  And 9 L of oxygen with activity to maintain O2 saturations greater than 88%. Patient had a CT chest done earlier today.  Results are pending. Getting worse with increased dyspnea /doe and decreased activity tolerance .     No Known Allergies  Immunization History  Administered Date(s) Administered   Influenza, Seasonal, Injecte, Preservative Fre 07/21/2013   Influenza,inj,Quad PF,6+ Mos 04/26/2015, 05/14/2016, 05/27/2017,  04/13/2018, 05/05/2019   Janssen (J&J) SARS-COV-2 Vaccination 12/16/2019   Pneumococcal Polysaccharide-23 07/21/2013   Zoster Recombinat (Shingrix) 05/18/2019    Past Medical History:  Diagnosis Date   Abnormal nuclear stress test 02/19/2018   Angina, class III ([place]) 02/19/2018   Anxiety    Chronic combined systolic and diastolic CHF (congestive heart failure) (HCC)    Chronic kidney disease, stage 3b (HCC)    Coronary artery disease    DDD (degenerative disc disease), cervical 03/01/2013   Diabetes mellitus    type 2   Diverticular disease 01/12/2014   mild   DVT (deep venous thrombosis) (HCC)    Dyslipidemia    Hypertension    ICD (implantable cardioverter-defibrillator) in place    ILD (interstitial lung disease) ([place])    Lumbosacral radiculopathy at L4 02/18/2017   Myocardial infarction ([place]) 2012   Obesity    Snoring 10/12/2013   TIA (transient ischemic attack) 08/09/2014   Tubular adenoma of colon 01/12/2014   transverse colon   VF (ventricular fibrillation) ([place])    arrest   Vitamin D deficiency 02/18/2017    Tobacco History: Social History   Tobacco Use  Smoking Status Never  Smokeless Tobacco Never   Counseling given: Not Answered   Outpatient Medications Prior to Visit  Medication Sig Dispense Refill   clopidogrel (PLAVIX) 75 MG tablet Take 75 mg by mouth daily.     furosemide (LASIX) 40 MG tablet Take 40 mg by mouth daily.     guaiFENesin-dextromethorphan (ROBITUSSIN DM) 100-10 MG/5ML syrup Take 10 mLs by mouth every 6 (six) hours as needed for cough. 118 mL 1  insulin aspart (NOVOLOG) 100 UNIT/ML injection Substitute to any brand approved.Before each meal 3 times a day, 140-199 - 2 units, 200-250 - 4 units, 251-299 - 6 units,  300-349 - 8 units,  350 or above 10 units. Dispense syringes and needles as needed, Ok to switch to PEN if approved. DX DM2, Code E11.65 (Patient taking differently: Substitute to any brand approved.Before each meal 3 times a day,  140-199 - 2 units, 200-250 - 4 units, 251-299 - 6 units,  300-349 - 8 units,  350 or above 10 units. Dispense syringes and needles as needed, Ok to switch to PEN if approved. DX DM2, Code E11.65) 10 mL 0   insulin glargine (LANTUS) 100 UNIT/ML Solostar Pen Inject 60 Units into the skin daily before breakfast.     Insulin Syringe-Needle U-100 25G X 1" 1 ML MISC For 4 times a day insulin SQ, 1 month supply. Diagnosis E11.65 30 each 0   metoprolol succinate (TOPROL-XL) 50 MG 24 hr tablet Take 1 tablet (50 mg total) by mouth in the morning and at bedtime. Take with or immediately following a meal. 180 tablet 1   mycophenolate (CELLCEPT) 500 MG tablet Take 1,000 mg by mouth 2 (two) times daily.     nitroGLYCERIN (NITROSTAT) 0.4 MG SL tablet PLACE 1 TABLET UNDER THE TONGUE EVERY 5 MINUTES AS NEEDED FOR CHEST PAIN 25 tablet 0   OXYGEN Inhale into the lungs. At 9 liters     OZEMPIC, 1 MG/DOSE, 4 MG/3ML SOPN Inject 1 mg into the skin once a week.     pantoprazole (PROTONIX) 40 MG tablet Take 40 mg by mouth daily.     predniSONE (DELTASONE) 5 MG tablet Take 1 tablet (5 mg total) by mouth daily with breakfast. (Patient taking differently: Take 5 mg by mouth. Taking 20 mg daily)     ranolazine (RANEXA) 500 MG 12 hr tablet Take 1 tablet (500 mg total) by mouth 2 (two) times daily. TAKE ONE TABLET BY MOUTH TWICE DAILY.PLEASE MAKE APPT WITH DR.HARDING FOR REFILLS. 60 tablet 0   rosuvastatin (CRESTOR) 40 MG tablet Take 1 tablet (40 mg total) by mouth daily. 90 tablet 3   vitamin B-12 (CYANOCOBALAMIN) 1000 MCG tablet TAKE 1 TABLET (1,000 MCG TOTAL) BY MOUTH DAILY. 30 tablet 0   warfarin (COUMADIN) 5 MG tablet Take 0.5-1 tablets (2.5-5 mg total) by mouth See admin instructions. Take 1 tablet (5 mg) daily except on Sun Take 1/2 tablet (2.5 mg)     No facility-administered medications prior to visit.     Review of Systems:   Constitutional:   No  weight loss, night sweats,  Fevers, chills,  +fatigue, or   lassitude.  HEENT:   No headaches,  Difficulty swallowing,  Tooth/dental problems, or  Sore throat,                No sneezing, itching, ear ache, nasal congestion, post nasal drip,   CV:  No chest pain,  Orthopnea, PND, swelling in lower extremities, anasarca, dizziness, palpitations, syncope.   GI  No heartburn, indigestion, abdominal pain, nausea, vomiting, diarrhea, change in bowel habits, loss of appetite, bloody stools.   Resp:   No chest wall deformity  Skin: no rash or lesions.  GU: no dysuria, change in color of urine, no urgency or frequency.  No flank pain, no hematuria   MS:  No joint pain or swelling.  No decreased range of motion.  No back pain.    Physical Exam  BP 102/60 (BP Location: Right Arm, Cuff Size: Normal)   Pulse 77   Temp 97.6 F (36.4 C) (Temporal)   Ht 5' 5"  (1.651 m)   Wt 167 lb (75.8 kg)   SpO2 96%   BMI 27.79 kg/m   GEN: A/Ox3; pleasant , NAD, chronically ill-appearing on oxygen in wheelchair   HEENT:  [place]/AT,   NOSE-clear, THROAT-clear, no lesions, no postnasal drip or exudate noted.   NECK:  Supple w/ fair ROM; no JVD; normal carotid impulses w/o bruits; no thyromegaly or nodules palpated; no lymphadenopathy.    RESP bibasilar crackles  no accessory muscle use, no dullness to percussion  CARD:  RRR, no m/r/g, tr  peripheral edema, pulses intact, no cyanosis or clubbing.  GI:   Soft & nt; nml bowel sounds; no organomegaly or masses detected.   Musco: Warm bil, no deformities or joint swelling noted.   Neuro: alert, no focal deficits noted.    Skin: Warm, no lesions or rashes    Lab Results:   BMET     Imaging: No results found.    No flowsheet data found.  No results found for: NITRICOXIDE      Assessment & Plan:   ILD (interstitial lung disease) ([place]) Increased symptom burden with increased oxygen demands-exacerbation. Will increase prednisone to 20 mg daily.  Patient has underlying diabetes.  Patient  education on steroids and diabetes and elevated blood sugars. Lab work today. CT chest pending  Plan  Patient Instructions  Increase Prednisone 29m daily for 2 weeks , then 179mdaily for 1 week and then 1025maily.  Continue Oxygen 3l./m and 6 l/m with activity . Oxygen sats goal >88-90% . Titrate Oxygen to maintain.  Home Nurse evaluation .  Continue on Cellcept and follow up with Rheumatology.  Labs today  Add Bactrim DS three days a week.  Call with CT results.  Robitussin DM 1-2 tsp every 4hrs as needed cough .  Tessalon Three times a day  As needed  cough .  Follow up with Dr. RamChase Caller 3 weeks as planned.  Please contact office for sooner follow up if symptoms do not improve or worsen or seek emergency care        Chronic [place] with [place] with ILD flare.  Titrate oxygen to maintain O2 saturations greater than 88 to 90%. Prior to admission last month patient was not on oxygen.  Since admission in July for congestive heart failure is required increased oxygen demands.  Plan  Patient Instructions  Increase Prednisone 47m4mily for 2 weeks , then 15mg17mly for 1 week and then 10mg 1my.  Continue Oxygen 3l./m and 6 l/m with activity . Oxygen sats goal >88-90% . Titrate Oxygen to maintain.  Home Nurse evaluation .  Continue on Cellcept and follow up with Rheumatology.  Labs today  Add Bactrim DS three days a week.  Call with CT results.  Robitussin DM 1-2 tsp every 4hrs as needed cough .  Tessalon Three times a day  As needed  cough .  Follow up with Dr. RamaswChase Callerweeks as planned.  Please contact office for sooner follow up if symptoms do not improve or worsen or seek emergency care        CHF (congestive heart failure) [place].  Patient is continue on current regimen.  Check labs today with be met and BNP.  Continue follow-up with cardiology  Plan  Patient  Instructions  Increase Prednisone 33m daily for 2 weeks , then 110mdaily for 1 week and then 1081maily.  Continue Oxygen 3l./m and 6 l/m with activity . Oxygen sats goal >88-90% . Titrate Oxygen to maintain.  Home Nurse evaluation .  Continue on Cellcept and follow up with Rheumatology.  Labs today  Add Bactrim DS three days a week.  Call with CT results.  Robitussin DM 1-2 tsp every 4hrs as needed cough .  Tessalon Three times a day  As needed  cough .  Follow up with Dr. RamChase Caller 3 weeks as planned.  Please contact office for sooner follow up if symptoms do not improve or worsen or seek emergency care        Weakness Physical deconditioning.  Continue with home PT.  Home health nurse evaluation.     TamRexene EdisonP 03/22/2021

## 2021-03-22 NOTE — Patient Instructions (Addendum)
Increase Prednisone 20mg  daily for 2 weeks , then 15mg  daily for 1 week and then 10mg  daily.  Continue Oxygen 3l./m and 6 l/m with activity . Oxygen sats goal >88-90% . Titrate Oxygen to maintain.  Home Nurse evaluation .  Continue on Cellcept and follow up with Rheumatology.  Labs today  Add Bactrim DS three days a week.  Call with CT results.  Robitussin DM 1-2 tsp every 4hrs as needed cough .  Tessalon Three times a day  As needed  cough .  Follow up with [name] in 3 weeks as planned.  Please contact office for sooner follow up if symptoms do not improve or worsen or seek emergency care

## 2021-03-22 NOTE — H&P (Signed)
History and Physical    Eugene [name] HUT:[id number] DOB: 06/19/1956 DOA: 03/22/2021  PCP: [name], MD  Patient coming from: Home by Pulmonology   Chief Complaint:  Chief Complaint  Patient presents with   Shortness of Breath    SOB w/abnormal labs from PCP.      HPI:    65 year old male with past medical history of chronic idiopathic pulmonary fibrosis, chronic hypoxic respiratory failure (on 3lpm  at rest and 6lpm with exertion via [place] at baseline), ischemic cardiomyopathy with systolic congestive heart failure  (last echo 01/2021 EF 45-50% with G1DD), coronary artery disease (S/P CABG 2006, cath with DES to SVG and OM2 in 06/2020 and another NSTEMI S/P cath 08/2020),  V. fib arrest (2012 S/P ICD palcement), cryptogenic stroke 11/2017, hyperlipidemia, chronic kidney disease stage IIIb, insulin-dependent diabetes mellitus type 2, paroxysmal atrial fibrillation presenting via EMS from pulmonology office due to rising BUN and creatinine in the setting of worsening shortness of breath and increasing oxygen requirement.  Patient states that he has been in his state of gradually worsening weakness and shortness of breath for approximate the past 3 months.  Patient explains that after his hospitalization at [facility] in early July his decline began to accelerate.  Week by week he noticed that he was developing increasing dyspnea on exertion with increasing frequency and dry nonproductive cough.  Patient has been following closely with his outpatient pulmonologist [name].  Patient explains that in the past 8 days his symptoms have become so severe that he is unable to even walk 5 steps before becoming profoundly short of breath.  Patient also notices that his oxygen requirements have dramatically increased.  Pulmonology documentation notes that patient used to be on 2 L of oxygen at rest and 4 L with activity on 8/3 by the time of his discussion with pulmonology on 8/19 he was  using up to 9 L of oxygen.  Patient was advised to go to the emergency department on 8/19 by his pulmonologist but refused.  Patient had telemedicine visit with pulmonology on 8/22 during which his usual daily regimen of 10 mg of prednisone was increased to 20 mg daily.  At this point the patient was requiring 4 L of oxygen at rest and 9 L with exertion.  Patient denies associated fevers, paroxysmal nocturnal dyspnea or pillow orthopnea.  Patient denies any chest pain at rest or with exertion.  Patient states that with his progressively worsening symptoms his appetite has become exceedingly poor.  Patient was eventually instructed to go to the emergency department on 8/25 due to repeat blood work revealing patient's BUN and creatinine were rising.  These laboratory findings in the setting of patient's progressive symptoms despite an increased prednisone regimen prompted pulmonology to instruct him to come in.  Upon evaluation in the emergency department chest imaging was concerning for pulmonary fibrosis with superimposed pulmonary edema.  ER provider also identified patient had a markedly elevated troponin of 1268 and discussed case with Dr. Hassell Done with cardiology who agreed to come evaluate the patient in consultation.  125 mg of intravenous Solu-Medrol was administered.  The hospitalist group was then called to assess the patient for admission to the hospital.  In the emergency department, patient was initially given 500 cc of normal saline as the patient was being worked up.  Patient additionally was noted to have a markedly elevated troponin of 1200 with chest x-ray revealing chronic fibrotic changes with possible superimposed pulmonary edema.  ER  provider discussed case with Dr. Hassell Done with cardiology who agreed to come evaluate the patient in consultation.  The hospitalist group was then called to assess the patient for admission to the hospital.  Review of Systems:   Review of Systems   Constitutional:  Positive for malaise/fatigue.  Respiratory:  Positive for cough and shortness of breath.   Neurological:  Positive for weakness.  All other systems reviewed and are negative.  Past Medical History:  Diagnosis Date   Abnormal nuclear stress test 02/19/2018   Angina, class III ([place]) 02/19/2018   Anxiety    Chronic combined systolic and diastolic CHF (congestive heart failure) (HCC)    Chronic kidney disease, stage 3b (HCC)    Coronary artery disease    DDD (degenerative disc disease), cervical 03/01/2013   Diabetes mellitus    type 2   Diverticular disease 01/12/2014   mild   DVT (deep venous thrombosis) (HCC)    Dyslipidemia    Hypertension    ICD (implantable cardioverter-defibrillator) in place    ILD (interstitial lung disease) ([place])    Lumbosacral radiculopathy at L4 02/18/2017   Myocardial infarction ([place]) 2012   Obesity    Snoring 10/12/2013   TIA (transient ischemic attack) 08/09/2014   Tubular adenoma of colon 01/12/2014   transverse colon   VF (ventricular fibrillation) ([place])    arrest   Vitamin D deficiency 02/18/2017    Past Surgical History:  Procedure Laterality Date   ABDOMINAL AORTAGRAM N/A 12/14/2013   Procedure: ABDOMINAL Maxcine Ham;  Surgeon: [name], MD;  Location: [facility] CATH LAB;  Service: Cardiovascular;  Laterality: N/A;   CARDIAC CATHETERIZATION  09/16/10   COLONOSCOPY  01/12/2014   CORONARY ARTERY BYPASS GRAFT  2006   CORONARY BALLOON ANGIOPLASTY N/A 09/12/2020   Procedure: CORONARY BALLOON ANGIOPLASTY;  Surgeon: Troy Sine, MD;  Location: [place] CV LAB;  Service: Cardiovascular;  Laterality: N/A;   CORONARY STENT INTERVENTION  07/06/2020   CORONARY STENT INTERVENTION N/A 07/06/2020   Procedure: CORONARY STENT INTERVENTION;  Surgeon: Nelva Bush, MD;  Location: [facility] CV LAB;  Service: Cardiovascular;  Laterality: N/A;   IMPLANTABLE CARDIOVERTER DEFIBRILLATOR IMPLANT  2012   STJ ICD- single chamber   LEFT  HEART CATH AND CORS/GRAFTS ANGIOGRAPHY N/A 02/19/2018   Procedure: LEFT HEART CATH AND CORS/GRAFTS ANGIOGRAPHY;  Surgeon: [name], MD;  Location: [place] CV LAB;  Service: Cardiovascular;  Laterality: N/A;   LEFT HEART CATH AND CORS/GRAFTS ANGIOGRAPHY N/A 07/06/2020   Procedure: LEFT HEART CATH AND CORS/GRAFTS ANGIOGRAPHY;  Surgeon: Nelva Bush, MD;  Location: [place] CV LAB;  Service: Cardiovascular;  Laterality: N/A;   LEFT HEART CATH AND CORS/GRAFTS ANGIOGRAPHY N/A 09/12/2020   Procedure: LEFT HEART CATH AND CORS/GRAFTS ANGIOGRAPHY;  Surgeon: Troy Sine, MD;  Location: [place] CV LAB;  Service: Cardiovascular;  Laterality: N/A;   LOOP RECORDER IMPLANT N/A 08/24/2014   Procedure: LOOP RECORDER IMPLANT;  Surgeon: [name], MD;  Location: [facility] CATH LAB;  Service: Cardiovascular;  Laterality: N/A;   MASS EXCISION  2008   right shoulder   MUSCLE BIOPSY Left 11/01/2020   Procedure: THIGH MUSCLE BIOPSY;  Surgeon: Georganna Skeans, MD;  Location: [facility] ORS;  Service: General;  Laterality: Left;   STERNECTOMY       reports that he has never smoked. He has never used smokeless tobacco. He reports that he does not drink alcohol and does not use drugs.  No Known Allergies  Family History  Problem Relation  Age of Onset   Breast cancer Mother 86       mets to colon and other   Glaucoma Mother    Colon cancer Mother    Coronary artery disease Father        early 72's   Diabetes Father    Hypertension Father    Diabetes Sister    Prostate cancer Brother    Heart disease Brother    Esophageal cancer Neg Hx    Pancreatic cancer Neg Hx    Stomach cancer Neg Hx    Liver disease Neg Hx      Prior to Admission medications   Medication Sig Start Date End Date Taking? Authorizing Provider  clopidogrel (PLAVIX) 75 MG tablet Take 75 mg by mouth daily.   Yes [provider]  furosemide (LASIX) 40 MG tablet Take 40 mg by mouth daily.   Yes [provider]   guaiFENesin-dextromethorphan (ROBITUSSIN DM) 100-10 MG/5ML syrup Take 10 mLs by mouth every 6 (six) hours as needed for cough. 01/31/21  Yes Shelly Coss, MD  insulin glargine (LANTUS) 100 UNIT/ML Solostar Pen Inject 60 Units into the skin daily before breakfast. 11/19/16  Yes [provider]  metoprolol succinate (TOPROL-XL) 50 MG 24 hr tablet Take 1 tablet (50 mg total) by mouth in the morning and at bedtime. Take with or immediately following a meal. 11/16/20  Yes Baldwin Jamaica, PA-C  mycophenolate (CELLCEPT) 500 MG tablet Take 1,000 mg by mouth 2 (two) times daily.   Yes [provider]  nitroGLYCERIN (NITROSTAT) 0.4 MG SL tablet PLACE 1 TABLET UNDER THE TONGUE EVERY 5 MINUTES AS NEEDED FOR CHEST PAIN 11/03/20  Yes Chandrasekhar, Mahesh A, MD  NOVOLOG FLEXPEN 100 UNIT/ML FlexPen Inject 0-10 Units into the skin 3 (three) times daily as needed for high blood sugar. Substitute to any brand approved.Before each meal 3 times a day, 140-199 - 2 units, 200-250 - 4 units, 251-299 - 6 units,  300-349 - 8 units,  350 or above 10 units. Dispense syringes and needles as needed, Ok to switch to PEN if approved. DX DM2, Code E11.65 01/31/21  Yes [provider]  OZEMPIC, 1 MG/DOSE, 4 MG/3ML SOPN Inject 1 mg into the skin once a week. 01/23/21  Yes [provider]  pantoprazole (PROTONIX) 40 MG tablet Take 40 mg by mouth daily.   Yes [provider]  predniSONE (DELTASONE) 10 MG tablet 2 tabs daily for 2 weeks then 1 1/2 tab daily for 2 weeks then 1 tab daily 03/22/21  Yes Parrett, Tammy S, NP  ranolazine (RANEXA) 500 MG 12 hr tablet Take 1 tablet (500 mg total) by mouth 2 (two) times daily. TAKE ONE TABLET BY MOUTH TWICE DAILY.PLEASE MAKE APPT WITH DR.HARDING FOR REFILLS. 01/31/21  Yes Shelly Coss, MD  rosuvastatin (CRESTOR) 40 MG tablet Take 1 tablet (40 mg total) by mouth daily. 01/02/21 04/02/21 Yes Chandrasekhar, Mahesh A, MD  vitamin B-12 (CYANOCOBALAMIN) 1000 MCG  tablet TAKE 1 TABLET (1,000 MCG TOTAL) BY MOUTH DAILY. 09/15/20 09/15/21 Yes Thurnell Lose, MD  warfarin (COUMADIN) 5 MG tablet Take 0.5-1 tablets (2.5-5 mg total) by mouth See admin instructions. Take 1 tablet (5 mg) daily except on Sun Take 1/2 tablet (2.5 mg) 01/31/21 01/31/22 Yes Shelly Coss, MD  benzonatate (TESSALON) 200 MG capsule Take 1 capsule (200 mg total) by mouth 3 (three) times daily as needed for cough. 03/22/21 03/22/22  Parrett, Fonnie Mu, NP  insulin aspart (NOVOLOG) 100 UNIT/ML injection  Substitute to any brand approved.Before each meal 3 times a day, 140-199 - 2 units, 200-250 - 4 units, 251-299 - 6 units,  300-349 - 8 units,  350 or above 10 units. Dispense syringes and needles as needed, Ok to switch to PEN if approved. DX DM2, Code E11.65 Patient not taking: No sig reported 09/16/20   Thurnell Lose, MD  Insulin Syringe-Needle U-100 25G X 1" 1 ML MISC For 4 times a day insulin SQ, 1 month supply. Diagnosis E11.65 09/16/20   Thurnell Lose, MD  OXYGEN Inhale into the lungs. At 9 liters    [provider]  predniSONE (DELTASONE) 5 MG tablet Take 1 tablet (5 mg total) by mouth daily with breakfast. Patient not taking: No sig reported 01/31/21   Shelly Coss, MD  sulfamethoxazole-trimethoprim (BACTRIM DS) 800-160 MG tablet Take 1 tablet by mouth 3 (three) times a week. 03/23/21   Melvenia Needles, NP    Physical Exam: Vitals:   03/22/21 2300 03/22/21 2319 03/22/21 2357 03/23/21 0354  BP: 115/66  119/79 108/75  Pulse: 72  73 70  Resp: (!) 22  (!) 22 20  Temp: (!) 97.5 F (36.4 C)  97.6 F (36.4 C) 97.6 F (36.4 C)  TempSrc: Oral  Oral Oral  SpO2: 100%  93% 96%  Weight:  73.2 kg    Height:        Constitutional: Awake alert and oriented x3, patient is in mild respiratory distress. Skin: no rashes, no lesions, extremely poor skin turgor noted. Eyes: Pupils are equally reactive to light.  No evidence of scleral icterus or conjunctival pallor.  ENMT: Extremely  dry mucous membranes noted.  Posterior pharynx clear of any exudate or lesions.   Neck: normal, supple, no masses, no thyromegaly.  No evidence of jugular venous distension.   Respiratory: Notable fine rales throughout the lungs bilaterally, worst in the bases.  No evidence of associated wheezing.  Notable increased respiratory effort without evidence of accessory muscle use.  Cardiovascular: Regular rate and rhythm, no murmurs / rubs / gallops. No extremity edema. 2+ pedal pulses. No carotid bruits.  Chest:   Nontender without crepitus or deformity.   Back:   Nontender without crepitus or deformity. Abdomen: Abdomen is soft and nontender.  No evidence of intra-abdominal masses.  Positive bowel sounds noted in all quadrants.   Musculoskeletal: No joint deformity upper and lower extremities. Good ROM, no contractures. Normal muscle tone.  Neurologic: CN 2-12 grossly intact. Sensation intact.  Patient moving all 4 extremities spontaneously.  Patient is following all commands.  Patient is responsive to verbal stimuli.   Psychiatric: Patient exhibits normal mood with appropriate affect.  Patient seems to possess insight as to their current situation.     Labs on Admission: I have personally reviewed following labs and imaging studies -   CBC: Recent Labs  Lab 03/22/21 1244 03/22/21 1905 03/23/21 0131  WBC 13.5* 13.4* 11.6*  NEUTROABS 12.5* 11.3* 10.9*  HGB 11.3* 12.4* 11.5*  HCT 35.0* 38.6* 34.9*  MCV 87.2 88.5 86.4  PLT 353.0 356 237   Basic Metabolic Panel: Recent Labs  Lab 03/22/21 1244 03/22/21 1905 03/23/21 0131  NA 133* 134* 137  K 3.1* 2.8* 2.6*  CL 92* 94* 100  CO2 26 27 27   GLUCOSE 413* 425* 179*  BUN 58* 56* 48*  CREATININE 2.80* 2.83* 2.64*  CALCIUM 9.1 9.4 9.1  MG  --   --  2.1   GFR: Estimated Creatinine Clearance: 24.3  mL/min (A) (by C-G formula based on SCr of 2.64 mg/dL (H)). Liver Function Tests: Recent Labs  Lab 03/22/21 1244 03/22/21 1905 03/23/21 0131   AST 28 32 25  ALT 53 61* 55*  ALKPHOS 79 76 72  BILITOT 0.6 0.9 0.6  PROT 6.4 6.3* 5.8*  ALBUMIN 3.5 3.3* 3.0*   No results for input(s): LIPASE, AMYLASE in the last 168 hours. No results for input(s): AMMONIA in the last 168 hours. Coagulation Profile: Recent Labs  Lab 03/22/21 1244 03/22/21 1926 03/22/21 2210 03/23/21 0131  INR 3.6* 1.1 2.5* 2.5*   Cardiac Enzymes: No results for input(s): CKTOTAL, CKMB, CKMBINDEX, TROPONINI in the last 168 hours. BNP (last 3 results) Recent Labs    07/26/20 1033 08/04/20 1127 03/22/21 1244  PROBNP 2,139* 2,329* 821.0*   HbA1C: Recent Labs    03/23/21 0131  HGBA1C 9.4*   CBG: No results for input(s): GLUCAP in the last 168 hours. Lipid Profile: Recent Labs    03/23/21 0131  CHOL 149  HDL 30*  LDLCALC 69  TRIG 248*  CHOLHDL 5.0   Thyroid Function Tests: No results for input(s): TSH, T4TOTAL, FREET4, T3FREE, THYROIDAB in the last 72 hours. Anemia Panel: No results for input(s): VITAMINB12, FOLATE, FERRITIN, TIBC, IRON, RETICCTPCT in the last 72 hours. Urine analysis:    Component Value Date/Time   COLORURINE YELLOW 09/09/2020 0032   APPEARANCEUR CLOUDY (A) 09/09/2020 0032   LABSPEC 1.013 09/09/2020 0032   PHURINE 6.0 09/09/2020 0032   GLUCOSEU 150 (A) 09/09/2020 0032   HGBUR LARGE (A) 09/09/2020 0032   BILIRUBINUR NEGATIVE 09/09/2020 0032   KETONESUR NEGATIVE 09/09/2020 0032   PROTEINUR 100 (A) 09/09/2020 0032   UROBILINOGEN 0.2 09/06/2010 1730   NITRITE NEGATIVE 09/09/2020 0032   LEUKOCYTESUR MODERATE (A) 09/09/2020 0032    Radiological Exams on Admission - Personally Reviewed: DG Chest Port 1 View  Result Date: 03/22/2021 CLINICAL DATA:  Worsening shortness of breath over the past week EXAM: PORTABLE CHEST 1 VIEW COMPARISON:  01/26/2021, chest CT from earlier in the same day. FINDINGS: Cardiac shadow remains enlarged. Postsurgical changes are noted. Defibrillator and loop recorder are seen and stable. Diffuse  chronic fibrotic changes are identified bilaterally although some suggestion of alveolar density is noted bilaterally likely representing some superimposed edema. No sizable effusion is noted. No bony abnormality is seen. IMPRESSION: Chronic fibrotic changes with mild superimposed edema. Electronically Signed   By: Inez Catalina M.D.   On: 03/22/2021 19:41    EKG: Personally reviewed.  Rhythm is normal sinus rhythm with heart rate of 77 bpm.  No dynamic ST segment changes appreciated.  Assessment/Plan Principal Problem:   Acute exacerbation of idiopathic pulmonary fibrosis ([place])  Patient presenting with several month history of progressive shortness of breath, dry nonproductive cough and generalized weakness which is rapidly accelerated in the past several weeks. Symptoms are seemingly consistent with exacerbation of patient's known history of idiopathic pulmonary fibrosis Clinically, patient appears to be volume depleted and despite the elevated BNP I doubt patient is suffering from a superimposed acute cardiogenic volume overload Patient may have some degree of an atypical infectious process.  We will obtain CRP, procalcitonin and CT imaging of the chest without contrast to better evaluate and will initiate antibiotics if indicated. In the meantime, placing patient on aggressive bronchodilator therapy as well as intravenous systemic steroids We will formally consult pulmonology in the morning for their assistance in managing this complicated patient  Active Problems:   Elevated troponin  Notably  elevated troponin in this patient with an extensive cardiac history Patient denies chest pain and has no evidence of dynamic EKG changes Despite the elevated troponin, I believe that this is likely supply demand mismatch in the setting of severe respiratory illness ER provider has discussed case with Dr. Hassell Done with cardiology who is formally consulted on the patient, their input is appreciated Patient  been placed on heparin infusion in the meantime Cycling cardiac enzymes Monitoring patient on telemetry    Lactic acidosis  Likely a mixture of type a and type B lactic acidosis due to volume depletion and bronchodilator use Hydrating patient with intravenous isotonic fluids Performing serial lactic acid levels to ensure downtrending and resolution    Chronic systolic CHF (congestive heart failure) ([place])  As mentioned above I do not believe that the patient is in acute cardiogenic volume overload    Acute renal failure superimposed on stage 3b chronic kidney disease ([place])  Rising BUN and creatinine suggestive of acute kidney injury superimposed on chronic kidney disease stage IIIb Likely secondary to volume depletion due to patient's poor oral intake due to progressive respiratory illness Hydrating patient gently with intravenous isotonic fluids Strict input and output monitoring Monitoring renal function and electrolytes with serial chemistries Avoiding nephrotoxic agents if at all possible    Leukocytosis  Leukocytosis likely secondary to ongoing systemic steroid use CRP and procalcitonin to better evaluate for any evidence of underlying infection as well as obtaining CT imaging of the chest as mentioned above.    Hypokalemia, inadequate intake  Replacing potassium with both oral and intravenous supplementation Monitoring potassium and magnesium levels via serial chemistries.    Type 2 diabetes mellitus with stage 3b chronic kidney disease, with long-term current use of insulin (HCC)  Patient been placed on Accu-Cheks before every meal and nightly with sliding scale insulin Continuing home regimen of basal insulin therapy Anticipate hyperglycemia with increasing doses of systemic steroids Hemoglobin A1c found to be 9.4% suggestive of poor control.   Code Status:  Full code Family Communication: deferred   Status is: Inpatient  Remains inpatient appropriate  because:Ongoing diagnostic testing needed not appropriate for outpatient work up, IV treatments appropriate due to intensity of illness or inability to take PO, and Inpatient level of care appropriate due to severity of illness  Dispo: The patient is from: Home              Anticipated d/c is to: Home              Patient currently is not medically stable to d/c.   Difficult to place patient No        Vernelle Emerald MD [facility] Pager [phone]  If 7PM-7AM, please contact night-coverage [email or web address] Use universal [place] password for that web site. If you do not have the password, please call the hospital operator.  03/23/2021, 5:13 AM

## 2021-03-22 NOTE — ED Notes (Signed)
Notified MD of critical troponin of 1268.

## 2021-03-22 NOTE — Assessment & Plan Note (Signed)
Physical deconditioning.  Continue with home PT.  Home health nurse evaluation.

## 2021-03-22 NOTE — Assessment & Plan Note (Signed)
Increased oxygen demands with ILD flare.  Titrate oxygen to maintain O2 saturations greater than 88 to 90%. Prior to admission last month patient was not on oxygen.  Since admission in July for congestive heart failure is required increased oxygen demands.  Plan  Patient Instructions  Increase Prednisone 20mg  daily for 2 weeks , then 15mg  daily for 1 week and then 10mg  daily.  Continue Oxygen 3l./m and 6 l/m with activity . Oxygen sats goal >88-90% . Titrate Oxygen to maintain.  Home Nurse evaluation .  Continue on Cellcept and follow up with Rheumatology.  Labs today  Add Bactrim DS three days a week.  Call with CT results.  Robitussin DM 1-2 tsp every 4hrs as needed cough .  Tessalon Three times a day  As needed  cough .  Follow up with [name] in 3 weeks as planned.  Please contact office for sooner follow up if symptoms do not improve or worsen or seek emergency care

## 2021-03-22 NOTE — Assessment & Plan Note (Signed)
Increased symptom burden with increased oxygen demands-exacerbation. Will increase prednisone to 20 mg daily.  Patient has underlying diabetes.  Patient education on steroids and diabetes and elevated blood sugars. Lab work today. CT chest pending  Plan  Patient Instructions  Increase Prednisone 20mg  daily for 2 weeks , then 15mg  daily for 1 week and then 10mg  daily.  Continue Oxygen 3l./m and 6 l/m with activity . Oxygen sats goal >88-90% . Titrate Oxygen to maintain.  Home Nurse evaluation .  Continue on Cellcept and follow up with Rheumatology.  Labs today  Add Bactrim DS three days a week.  Call with CT results.  Robitussin DM 1-2 tsp every 4hrs as needed cough .  Tessalon Three times a day  As needed  cough .  Follow up with [name] in 3 weeks as planned.  Please contact office for sooner follow up if symptoms do not improve or worsen or seek emergency care

## 2021-03-22 NOTE — Assessment & Plan Note (Addendum)
Recent admission for decompensated heart failure.  Patient is continue on current regimen.  Check labs today with be met and BNP.  Continue follow-up with cardiology  Plan  Patient Instructions  Increase Prednisone 20mg daily for 2 weeks , then 15mg daily for 1 week and then 10mg daily.  Continue Oxygen 3l./m and 6 l/m with activity . Oxygen sats goal >88-90% . Titrate Oxygen to maintain.  Home Nurse evaluation .  Continue on Cellcept and follow up with Rheumatology.  Labs today  Add Bactrim DS three days a week.  Call with CT results.  Robitussin DM 1-2 tsp every 4hrs as needed cough .  Tessalon Three times a day  As needed  cough .  Follow up with Dr. Ramaswamy in 3 weeks as planned.  Please contact office for sooner follow up if symptoms do not improve or worsen or seek emergency care      Late add  Lab work returned showing decompensated congestive heart failure with elevated BNP.  Worsening chronic kidney disease and elevated INR Patient will need hospitalization for further evaluation and treatment options.  Contacted patient patient aware to go to the emergency room. 

## 2021-03-22 NOTE — Consult Note (Signed)
Cardiology Consultation:   Patient ID: Eugene [name] MRN: [id number]; DOB: Dec 26, 1955  Admit date: 03/22/2021 Date of Consult: 03/22/2021  PCP:  Eugene Bowen, MD   [place] Prov[facility] Cardiologist:  Eugene Lean, MD        Patient Profile:   Eugene [name] is a 65 y.o. male with a hx of CAD, h/o CABG and PCI, h/o VF arrest s/p ICD implanted 2012, chronic combined HF, who is being seen 03/22/2021 for the evaluation of abnormal HS troponin at the request of Eugene [name].  History of Present Illness:    Eugene [name]  comes in with DOE (he has chronic idiopathic pulm fibrosis and is on chronic home O2 2L [place]) and because he had elevated labs at PCP office today and was told to present to the ED for further evaluation. Pt was sent to ED primarily for acutely elevated creatinine (2.8, above baseline 1.8-2.0), hypokalemia, hyperglycemia among other significant electrolyte abnormalities. HS troponin was also checked and was 1268. We are called to evaluate pt due to elevated HS troponin. He has an extensive cardiac hx; Eugene [name] underwent remote CABG 2006. In 2012 he suffered an inferior MI with VF arrest with unsuccessful PCI to the occluded SVG-RCA graft. EF was normal. ICD was placed. ILR was implanted 2016 2/2 cryptogenic stroke with EOS 12/21/2017. EF appears relatively preserved over the years except intermittently in the 40% range. Stress test in 2019 was abnormal with EF 42%, followed up by heart cath with new CTO-SVG-RI treated medically. In 06/2020 he had another cardiac cath resulting in PCI/DES to SVG-OM2 and PCI/DES to OM2. He was readmitted 08/2020 with multiple medical issues including NSTEMI, bilateral lower extremity weakness with elevated CK/ESR and positive aldolase and double-stranded DNA, incidental Covid-infection with evidence of interstitial lung disease/possible pulmonary fibrosis on imaging, acute lower extremity DVT, paroxysmal atrial fib, transaminitis, UTI, AKI  superimposed on CKD stage IV, B12 deficiency. He underwent repeat cath showing early ISR in the SVG-OM2 with difficult but ultimately successful intervention through the SVG-OM2 requiring PTCA. Given ISR on Plavix he was switched to Brilinta. 2D echo during that admission showed EF 45-50% with hypokinetic anterior septum, inferior wall, basal inferoseptal segment, and apex. Antiplatelet regimen is challenging to follow since that time. He saw Eugene [name] in follow-up 09/21/20. Eventual muscle biopsy was planned by rheumatology. At that visit, ASA and Brilinta were discontinued and he was started on Plavix and continued on Coumadin to be followed by PCP. Per phone note 09/22/20 with Eugene [name] discussing with rheumatology, the plan was instead to restart ASA 26m and Brilinta, stop Plavix, stop Lipitor, and defer biopsy for 1 month. The patient [facility] given contraindications with statin and elevated CK. He had the biopsy done 11/01/20. 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 The patient states he was started on mycophenolate to help "something in my chest." In a follow-up visit 11/16/20 with RTommye [name] she noted he had had an ICD shock 11/13/20 felt possibly a dual tachycardia with rapid AF/VT. He had possibly missed his metoprolol the day of the arrhythmia. Her note indicates he was on ASA and warfarin at that time, had stopped Brilinta for a couple of weeks. He was not on Plavix at that visit either. Per her discussion with Dr. CGasper Sellsthey stopped ASA, started Brilinta back, and increased metoprolol. Per 01/02/21 phone note with pharmD, patient reported muscle bx negative so PCSK9 was switched back  to Crestor. Per 01/22/21 phone note he had a Merlin alert for VF terminated with 1 shock, further recs pending with EP follow-up arranged. The patient was sleeping at time of event and this woke him up. He was most recently admitted July  2022 at which time he was treated for SOB, edema. He was discharged on plavix, coumadin and crestor. He was also d/Eugene'd on metoprolol 8m bid. Most recent cardiology contact was w/ EP on 02-20-21 (Dr. TLovena [name] no significant changes were made at that visit. He has no chest discomfort. He states his current issues/sx are not similar to prior angina. EKG overall unchanged from prior tracings.  Past Medical History:  Diagnosis Date   Abnormal nuclear stress test 02/19/2018   [place] 02/19/2018   Anxiety    Chronic combined systolic and diastolic CHF (congestive heart failure) (HCC)    Chronic kidney disease, stage 3b (HCC)    Coronary artery disease    DDD (degenerative disc disease), cervical 03/01/2013   Diabetes mellitus    type 2   Diverticular disease 01/12/2014   mild   DVT (deep venous thrombosis) (HCC)    Dyslipidemia    Hypertension    ICD (implantable cardioverter-defibrillator) in place    ILD (interstitial lung disease) ([facility]    Lumbosacral radiculopathy at L4 02/18/2017   Myocardial infarction ([facility] 2012   Obesity    Snoring 10/12/2013   TIA (transient ischemic attack) 08/09/2014   Tubular adenoma of colon 01/12/2014   transverse colon   VF [place] fibrillation) [place]    arrest   Vitamin D deficiency 02/18/2017    Past Surgical History:  Procedure Laterality Date   ABDOMINAL AORTAGRAM N/A 12/14/2013   Procedure: ABDOMINAL AMaxcine Ham  Surgeon: VSerafina Mitchell MD;  [facility];  Service: Cardiovascular;  Laterality: N/A;   CARDIAC CATHETERIZATION  09/16/10   COLONOSCOPY  01/12/2014   CORONARY ARTERY BYPASS GRAFT  2006   CORONARY BALLOON ANGIOPLASTY N/A 09/12/2020   Procedure: CORONARY BALLOON ANGIOPLASTY;  Surgeon: Eugene Sine MD;  [facility];  Service: Cardiovascular;  Laterality: N/A;   CORONARY STENT INTERVENTION  07/06/2020   CORONARY STENT INTERVENTION N/A 07/06/2020   Procedure: CORONARY STENT INTERVENTION;   Surgeon: Eugene Bush MD;  [facility];  Service: Cardiovascular;  Laterality: N/A;   IMPLANTABLE CARDIOVERTER DEFIBRILLATOR IMPLANT  2012   STJ ICD- single chamber   LEFT HEART CATH AND CORS/GRAFTS ANGIOGRAPHY N/A 02/19/2018   Procedure: LEFT HEART CATH AND CORS/GRAFTS ANGIOGRAPHY;  Surgeon: HLeonie Man MD;  [facility];  Service: Cardiovascular;  Laterality: N/A;   LEFT HEART CATH AND CORS/GRAFTS ANGIOGRAPHY N/A 07/06/2020   Procedure: LEFT HEART CATH AND CORS/GRAFTS ANGIOGRAPHY;  Surgeon: Eugene Bush MD;  [facility];  Service: Cardiovascular;  Laterality: N/A;   LEFT HEART CATH AND CORS/GRAFTS ANGIOGRAPHY N/A 09/12/2020   Procedure: LEFT HEART CATH AND CORS/GRAFTS ANGIOGRAPHY;  Surgeon: Eugene Sine MD;  [facility];  Service: Cardiovascular;  Laterality: N/A;   LOOP RECORDER IMPLANT N/A 08/24/2014   Procedure: LOOP RECORDER IMPLANT;  Surgeon: GEvans Lance MD;  [facility];  Service: Cardiovascular;  Laterality: N/A;   MASS EXCISION  2008   right shoulder   MUSCLE BIOPSY Left 11/01/2020   Procedure: THIGH MUSCLE BIOPSY;  Surgeon: TGeorganna Skeans MD;  Location: [facility];  Service: General;  Laterality: Left;   STERNECTOMY  Home Medications:  Prior to Admission medications   Medication Sig Start Date End Date Taking? Authorizing Provider  benzonatate (TESSALON) 200 MG capsule Take 1 capsule (200 mg total) by mouth 3 (three) times daily as needed for cough. 03/22/21 03/22/22  Parrett, Fonnie Mu, NP  clopidogrel (PLAVIX) 75 MG tablet Take 75 mg by mouth daily.    [provider]  furosemide (LASIX) 40 MG tablet Take 40 mg by mouth daily.    [provider]  guaiFENesin-dextromethorphan (ROBITUSSIN DM) 100-10 MG/5ML syrup Take 10 mLs by mouth every 6 (six) hours as needed for cough. 01/31/21   Shelly Coss, MD  insulin aspart (NOVOLOG) 100 UNIT/ML injection Substitute to any  brand approved.Before each meal 3 times a day, 140-199 - 2 units, 200-250 - 4 units, 251-299 - 6 units,  300-349 - 8 units,  350 or above 10 units. Dispense syringes and needles as needed, Ok to switch to PEN if approved. DX DM2, Code E11.65 Patient taking differently: Substitute to any brand approved.Before each meal 3 times a day, 140-199 - 2 units, 200-250 - 4 units, 251-299 - 6 units,  300-349 - 8 units,  350 or above 10 units. Dispense syringes and needles as needed, Ok to switch to PEN if approved. DX DM2, Code E11.65 09/16/20   Thurnell Lose, MD  insulin glargine (LANTUS) 100 UNIT/ML Solostar Pen Inject 60 Units into the skin daily before breakfast. 11/19/16   [provider]  Insulin Syringe-Needle U-100 25G X 1" 1 ML MISC For 4 times a day insulin SQ, 1 month supply. Diagnosis E11.65 09/16/20   Thurnell Lose, MD  metoprolol succinate (TOPROL-XL) 50 MG 24 hr tablet Take 1 tablet (50 mg total) by mouth in the morning and at bedtime. Take with or immediately following a meal. 11/16/20   Baldwin Jamaica, PA-Eugene  mycophenolate (CELLCEPT) 500 MG tablet Take 1,000 mg by mouth 2 (two) times daily.    [provider]  nitroGLYCERIN (NITROSTAT) 0.4 MG SL tablet PLACE 1 TABLET UNDER THE TONGUE EVERY 5 MINUTES AS NEEDED FOR CHEST PAIN 11/03/20   Eugene Lean, MD  OXYGEN Inhale into the lungs. At 9 liters    [provider]  OZEMPIC, 1 MG/DOSE, 4 MG/3ML SOPN Inject 1 mg into the skin once a week. 01/23/21   [provider]  pantoprazole (PROTONIX) 40 MG tablet Take 40 mg by mouth daily.    [provider]  predniSONE (DELTASONE) 10 MG tablet 2 tabs daily for 2 weeks then 1 1/2 tab daily for 2 weeks then 1 tab daily 03/22/21   Parrett, Tammy S, NP  predniSONE (DELTASONE) 5 MG tablet Take 1 tablet (5 mg total) by mouth daily with breakfast. Patient taking differently: Take 5 mg by mouth. Taking 20 mg daily 01/31/21   Shelly Coss, MD  ranolazine  (RANEXA) 500 MG 12 hr tablet Take 1 tablet (500 mg total) by mouth 2 (two) times daily. TAKE ONE TABLET BY MOUTH TWICE DAILY.PLEASE MAKE APPT WITH EugeneHARDING FOR REFILLS. 01/31/21   Shelly Coss, MD  rosuvastatin (CRESTOR) 40 MG tablet Take 1 tablet (40 mg total) by mouth daily. 01/02/21 04/02/21  Eugene Lean, MD  sulfamethoxazole-trimethoprim (BACTRIM DS) 800-160 MG tablet Take 1 tablet by mouth 3 (three) times a week. 03/23/21   Parrett, Fonnie Mu, NP  vitamin B-12 (CYANOCOBALAMIN) 1000 MCG tablet TAKE 1 TABLET (1,000 MCG TOTAL) BY MOUTH DAILY. 09/15/20 09/15/21  Thurnell Lose, MD  warfarin (COUMADIN)  5 MG tablet Take 0.5-1 tablets (2.5-5 mg total) by mouth See admin instructions. Take 1 tablet (5 mg) daily except on Sun Take 1/2 tablet (2.5 mg) 01/31/21 01/31/22  Shelly Coss, MD    Inpatient Medications: Scheduled Meds:  Continuous Infusions:  PRN Meds:   Allergies:   No Known Allergies  Social History:   Social History   Socioeconomic History   Marital status: Married    Spouse name: Not on file   Number of children: 0   Years of education: Not on file   Highest education level: Not on file  Occupational History   Occupation: STORE MANAGER     Employer: [employer]    Comment: [place], [place]  Tobacco Use   Smoking status: Never   Smokeless tobacco: Never  Vaping Use   Vaping Use: Never used  Substance and Sexual Activity   Alcohol use: No   Drug use: No   Sexual activity: Not Currently  Other Topics Concern   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[place], [place]   6 caffeinated beverages daily   Social Determinants of Health   Financial Resource Strain: Not on file  Food Insecurity: Not on file  Transportation Needs: No Transportation Needs   Lack of Transportation (Medical): No   Lack of Transportation (Non-Medical): No  Physical Activity: Not on file  Stress: Not on file  Social  Connections: Not on file  Intimate Partner Violence: Not on file    Family History:    Family History  Problem Relation Age of Onset   Breast cancer Mother 33       mets to colon and other   Glaucoma Mother    Colon cancer Mother    Coronary artery disease Father        early 61's   Diabetes Father    Hypertension Father    Diabetes Sister    Prostate cancer Brother    Heart disease Brother    Esophageal cancer Neg Hx    Pancreatic cancer Neg Hx    Stomach cancer Neg Hx    Liver disease Neg Hx      ROS:  Please see the history of present illness.   All other ROS reviewed and negative.     Physical Exam/Data:   Vitals:   03/22/21 1855 03/22/21 1856 03/22/21 1945 03/22/21 2030  BP: 117/72  127/85 129/72  Pulse: 76  73 76  Resp: (!) 35  (!) 28 (!) 21  Temp: 97.9 F (36.6 Eugene)     TempSrc: Oral     SpO2: 94%  98% 95%  Weight:  75.8 kg    Height:  5' 5"  (1.651 m)     No intake or output data in the 24 hours ending 03/22/21 2043 Last 3 Weights 03/22/2021 03/22/2021 03/19/2021  Weight (lbs) 167 lb 167 lb 161 lb  Weight (kg) 75.751 kg 75.751 kg 73.029 kg     Body mass index is 27.79 kg/m.  General:  Well nourished, well developed, in no acute distress HEENT: normal Lymph: no adenopathy Neck: no JVD Endocrine:  No thryomegaly Vascular: No carotid bruits; DP pulses 1+ bilaterally   Cardiac:  normal S1, S2; RRR; no murmur. Sternal scar Lungs:  diffuse dry crackles bilaterally Abd: soft, nontender, no hepatomegaly  Ext: no edema Musculoskeletal:  No deformities Skin: warm and dry  Neuro:  no focal abnormalities noted Psych:  Normal affect   EKG:  The EKG was personally reviewed and demonstrates:  NSR with borderline 1st deg AVB, RBBB and LPFB, LAE. Slightly changed from most recent tracing, but unchanged from older ones (01-26-21) Telemetry:  Telemetry was personally reviewed and demonstrates:  NSR  Relevant CV Studies: 09-12-20 LHC Prox LAD lesion is 99%  stenosed. Ost LAD to Prox LAD lesion is 100% stenosed. Mid LAD lesion is 80% stenosed. 1st Mrg lesion is 99% stenosed. Ost Cx to Prox Cx lesion is 80% stenosed. Mid Cx lesion is 100% stenosed. Balloon angioplasty was performed. Prox RCA to Dist RCA lesion is 100% stenosed with 100% stenosed side branch in RPAV. Ost Ramus lesion is 100% stenosed. SVG and is normal in caliber. Previously placed Prox Graft drug eluting stent is widely patent. LIMA and is large. The graft exhibits no disease. SVG and is normal in caliber. Origin to Prox Graft lesion is 100% stenosed. SVG and is large. Prox Graft to Dist Graft lesion is 100% stenosed. Dist Cx lesion is 95% stenosed. Mid LM to Dist LM lesion is 80% stenosed. Post intervention, there is a 20% residual stenosis.   Severe native coronary obstructive disease 80% distal left main stenosis, total occlusion of the LAD, ramus immediate vessel, high-grade circumflex stenoses, total occlusion of the proximal RCA.  There was a shunt to the distal RCA via left to right and right to right collateralization.   Patent LIMA graft supplying the mid LAD with diffuse disease in the LAD after insertion.   Supplying the OM 2 vessel with widely patent stent in the proximal portion of the SVG.  There is 95% in-stent restenosis in the native OM 2 vessel beyond the anastomosis.   Old occlusion of the vein graft which had supplied the ramus vessel.   Old occlusion of the vein graft which had supplied the PDA.   Difficult but ultimately successful intervention through the SVG to the distal OM 2 vessel requiring PTCA, [name] Cutting Balloon, and ultimate noncompliant balloon dilatation with a 95% stenosis being reduced to less than 20%.   LVEDP: 11 mmHg   RECOMMENDATION: DAPT probably indefinitely.  Since the patient developed early in-stent restenosis on aspirin/Plavix, Brilinta was utilized for more aggressive antiplatelet therapy.  Medical therapy for  concomitant CAD.  Optimal blood pressure control, diabetes management, and aggressive lipid therapy with target LDL less than 70.  01-27-21 TTE 1. Left ventricular ejection fraction, by estimation, is 45 to 50%. The  left ventricle has mildly decreased function. The left ventricle  demonstrates regional wall motion abnormalities.      There is hypokinesis of the basal-to-mid anteroseptal, basal-to-mid  anterior, basal-to-mid inferior and basal inferoseptal LV walls. Left  ventricular diastolic parameters are consistent with Grade I diastolic  dysfunction (impaired relaxation).   2. Right ventricular systolic function is mildly reduced. The right  ventricular size is not well visualized.   3. The mitral valve is grossly normal. Trivial mitral valve  regurgitation. No evidence of mitral stenosis.   4. The aortic valve is tricuspid. There is mild calcification of the  aortic valve. There is moderate thickening of the aortic valve. Aortic  valve regurgitation is not visualized. Mild to moderate aortic valve  sclerosis/calcification is present, without  any evidence of aortic stenosis.   Laboratory Data:  High Sensitivity Troponin:   Recent Labs  Lab 03/22/21 1905  TROPONINIHS 1,268*     Chemistry Recent Labs  Lab 03/22/21 1244 03/22/21 1905  NA 133* 134*  K 3.1* 2.8*  CL 92* 94*  CO2 26 27  GLUCOSE 413* 425*  BUN 58* 56*  CREATININE 2.80* 2.83*  CALCIUM 9.1 9.4  GFRNONAA  --  24*  ANIONGAP  --  13    Recent Labs  Lab 03/22/21 1244 03/22/21 1905  PROT 6.4 6.3*  ALBUMIN 3.5 3.3*  AST 28 32  ALT 53 61*  ALKPHOS 79 76  BILITOT 0.6 0.9   Hematology Recent Labs  Lab 03/22/21 1244 03/22/21 1905  WBC 13.5* 13.4*  RBC 4.02* 4.36  HGB 11.3* 12.4*  HCT 35.0* 38.6*  MCV 87.2 88.5  MCH  --  28.4  MCHC 32.4 32.1  RDW 16.3* 15.4  PLT 353.0 356   BNP Recent Labs  Lab 03/22/21 1244  PROBNP 821.0*    DDimer No results for input(s): DDIMER in the last 168  hours.   Radiology/Studies:  DG Chest Port 1 View  Result Date: 03/22/2021 CLINICAL DATA:  Worsening shortness of breath over the past week EXAM: PORTABLE CHEST 1 VIEW COMPARISON:  01/26/2021, chest CT from earlier in the same day. FINDINGS: Cardiac shadow remains enlarged. Postsurgical changes are noted. Defibrillator and loop recorder are seen and stable. Diffuse chronic fibrotic changes are identified bilaterally although some suggestion of alveolar density is noted bilaterally likely representing some superimposed edema. No sizable effusion is noted. No bony abnormality is seen. IMPRESSION: Chronic fibrotic changes with mild superimposed edema. Electronically Signed   By: Inez Catalina M.D.   On: 03/22/2021 19:41     Assessment and Plan:   DOE/elevated HS trop: SOB/DOE chronic, due largely to IPF. There may be an acute cardiac component but it's difficult to tell exactly what might be going on at this point as we don't know trend of troponin. Pt has significant CAD and issues w/ early restenosis of prior PCI and has been on multiple agents in effort to treat that. He has most recently been on plavix. Will cont current regimen for now and add IV heparin. Cont to cycle troponins to gauge trend. No acute EKG changes. There are multiple non-cardiac medical issues going on, including AOCRF, that are also likely contributing to his worsening sx. CAD: see above. Consider LHC if trop trends significantly upward HTN/dyslipidemia: restart home regimen as tolerated ICM: very mild LV systolic dysfunction on most recent TTE (EF 45-50%). He currently has some mild volume overload that is probably primarily driven by his acute renal failure. Would get updated TTE to make sure there has not been significant drop in LVEF. Cautious diuresis OK w/ careful monitoring of renal function H/o PAF: currently in NSR.  Hold coumadin while on heparin H/o VF: ICD in place. No recent therapy Non-cardiac problems: as per  primary medical team FEN: supplement K  Risk Assessment/Risk Scores:     TIMI Risk Score for Unstable Angina or Non-ST Elevation MI:   The patient's TIMI risk score is 5, which indicates a 26% risk of all cause mortality, new or recurrent myocardial infarction or need for urgent revascularization in the next 14 days.  New York Heart Association (NYHA) Functional Class NYHA Class III  CHA2DS2-VASc Score = 7  This indicates a 11.2% annual risk of stroke. The patient's score is based upon: CHF History: Yes HTN History: Yes Diabetes History: Yes Stroke History: Yes Vascular Disease History: Yes Age Score: 1 Gender Score: 0         For questions or updates, please contact [place] Please consult [email or web address] for contact  info under    Signed, Rudean Curt, MD, [facility]  03/22/2021 8:43 PM

## 2021-03-22 NOTE — Progress Notes (Addendum)
ANTICOAGULATION CONSULT NOTE - Initial Consult  Pharmacy Consult for Heparin Indication: chest pain/ACS  No Known Allergies  Patient Measurements: Height: 5\' 5"  (165.1 cm) Weight: 75.8 kg (167 lb) IBW/kg (Calculated) : 61.5  Vital Signs: Temp: 97.9 F (36.6 C) (08/25 1855) Temp Source: Oral (08/25 1855) BP: 116/71 (08/25 2230) Pulse Rate: 72 (08/25 2230)  Labs: Recent Labs    03/22/21 1244 03/22/21 1905 03/22/21 1926 03/22/21 2130 03/22/21 2210  HGB 11.3* 12.4*  --   --   --   HCT 35.0* 38.6*  --   --   --   PLT 353.0 356  --   --   --   LABPROT 36.2*  --  14.4  --  27.4*  INR 3.6*  --  1.1  --  2.5*  CREATININE 2.80* 2.83*  --   --   --   TROPONINIHS  --  1,268*  --  1,218*  --     Estimated Creatinine Clearance: 24.7 mL/min (A) (by C-G formula based on SCr of 2.83 mg/dL (H)).   Medical History: Past Medical History:  Diagnosis Date   Abnormal nuclear stress test 02/19/2018   Angina, class III ([place]) 02/19/2018   Anxiety    Chronic combined systolic and diastolic CHF (congestive heart failure) (HCC)    Chronic kidney disease, stage 3b (HCC)    Coronary artery disease    DDD (degenerative disc disease), cervical 03/01/2013   Diabetes mellitus    type 2   Diverticular disease 01/12/2014   mild   DVT (deep venous thrombosis) (HCC)    Dyslipidemia    Hypertension    ICD (implantable cardioverter-defibrillator) in place    ILD (interstitial lung disease) ([place])    Lumbosacral radiculopathy at L4 02/18/2017   Myocardial infarction ([place]) 2012   Obesity    Snoring 10/12/2013   TIA (transient ischemic attack) 08/09/2014   Tubular adenoma of colon 01/12/2014   transverse colon   VF (ventricular fibrillation) ([place])    arrest   Vitamin D deficiency 02/18/2017    Medications:  (Not in a hospital admission)  Scheduled:  Infusions:   Assessment: 63 yom with a history of CAD, h/o CABG and PCI, h/o VF arrest s/p ICD implanted 2012, chronic combined HF.  Patient presented to ED with SOB. Heparin infusion per pharmacy ordered for ACS.  Patient is on warfarin prior to arrival. Last taken 8/24 @ 2030. Home dose 5mg  [id number]; 2.5mg  on Su.   PT/INR 36.2/3.6 earlier today prior to arrival. Repeat INR here less than 7 hours later is 1.1 without apparent reversal agent given. Repeat INR ordered to confirm prior to heparin start - resulted at 2.5   Hgb 12.4; plt 356  Goal of Therapy:  Heparin level 0.3-0.7 units/ml Monitor platelets by anticoagulation protocol: Yes   Plan:  Holding warfarin - monitor for plan for re-start Hold heparin until INR <2 - Check INR in AM Monitor anti-Xa level while on heparin Continue to monitor H&H and platelets  Heloise Purpura 03/22/2021,10:59 PM

## 2021-03-22 NOTE — ED Triage Notes (Signed)
Pt BIBA from PCP office due to abnormal labs. Pt went to PCP due to worsening SOB that has gotten worse over past 5 to 7 days. Pt also endorses dyspnea upon exertion. Pt has hx of idiopathic pulmonary fibrosis and presents to ED on 4L of O2 via nasal cannula. Per medic, patient's PCP also concerned about abnormal labs from patient stating patient has elevated BUN/ Creatinine. Other vitals from medic include BP of 132/60, RR: 22, HR: 77 and CBG of 541. Patient A&Ox4 at this time with IV access established via medic PTA.

## 2021-03-23 ENCOUNTER — Other Ambulatory Visit (HOSPITAL_COMMUNITY): Payer: Self-pay

## 2021-03-23 DIAGNOSIS — J84112 Idiopathic pulmonary fibrosis: Secondary | ICD-10-CM | POA: Diagnosis not present

## 2021-03-23 DIAGNOSIS — E876 Hypokalemia: Secondary | ICD-10-CM

## 2021-03-23 DIAGNOSIS — I5022 Chronic systolic (congestive) heart failure: Secondary | ICD-10-CM

## 2021-03-23 DIAGNOSIS — D72829 Elevated white blood cell count, unspecified: Secondary | ICD-10-CM

## 2021-03-23 DIAGNOSIS — N1832 Chronic kidney disease, stage 3b: Secondary | ICD-10-CM

## 2021-03-23 DIAGNOSIS — E1122 Type 2 diabetes mellitus with diabetic chronic kidney disease: Secondary | ICD-10-CM

## 2021-03-23 DIAGNOSIS — Z794 Long term (current) use of insulin: Secondary | ICD-10-CM

## 2021-03-23 DIAGNOSIS — E872 Acidosis: Secondary | ICD-10-CM

## 2021-03-23 DIAGNOSIS — R778 Other specified abnormalities of plasma proteins: Secondary | ICD-10-CM

## 2021-03-23 DIAGNOSIS — N179 Acute kidney failure, unspecified: Secondary | ICD-10-CM

## 2021-03-23 LAB — GLUCOSE, CAPILLARY
Glucose-Capillary: 392 mg/dL — ABNORMAL HIGH (ref 70–99)
Glucose-Capillary: 188 mg/dL — ABNORMAL HIGH (ref 70–99)
Glucose-Capillary: 189 mg/dL — ABNORMAL HIGH (ref 70–99)
Glucose-Capillary: 241 mg/dL — ABNORMAL HIGH (ref 70–99)
Glucose-Capillary: 193 mg/dL — ABNORMAL HIGH (ref 70–99)

## 2021-03-23 LAB — COMPREHENSIVE METABOLIC PANEL
ALT: 55 U/L — ABNORMAL HIGH (ref 0–44)
AST: 25 U/L (ref 15–41)
Albumin: 3 g/dL — ABNORMAL LOW (ref 3.5–5.0)
Alkaline Phosphatase: 72 U/L (ref 38–126)
Anion gap: 10 (ref 5–15)
BUN: 48 mg/dL — ABNORMAL HIGH (ref 8–23)
CO2: 27 mmol/L (ref 22–32)
Calcium: 9.1 mg/dL (ref 8.9–10.3)
Chloride: 100 mmol/L (ref 98–111)
Creatinine, Ser: 2.64 mg/dL — ABNORMAL HIGH (ref 0.61–1.24)
GFR, Estimated: 26 mL/min — ABNORMAL LOW (ref >60)
Glucose, Bld: 179 mg/dL — ABNORMAL HIGH (ref 70–99)
Potassium: 2.6 mmol/L — CL (ref 3.5–5.1)
Sodium: 137 mmol/L (ref 135–145)
Total Bilirubin: 0.6 mg/dL (ref 0.3–1.2)
Total Protein: 5.8 g/dL — ABNORMAL LOW (ref 6.5–8.1)

## 2021-03-23 LAB — LIPID PANEL
Cholesterol: 149 mg/dL (ref 0–200)
HDL: 30 mg/dL — ABNORMAL LOW (ref >40)
LDL Cholesterol: 69 mg/dL (ref 0–99)
Total CHOL/HDL Ratio: 5 RATIO
Triglycerides: 248 mg/dL — ABNORMAL HIGH (ref <150)
VLDL: 50 mg/dL — ABNORMAL HIGH (ref 0–40)

## 2021-03-23 LAB — BLOOD GAS, ARTERIAL
Acid-Base Excess: 2.1 mmol/L — ABNORMAL HIGH (ref 0.0–2.0)
Bicarbonate: 25.9 mmol/L (ref 20.0–28.0)
Drawn by: 270271
FIO2: 32
O2 Saturation: 94.3 %
Patient temperature: 37
pCO2 arterial: 38.7 mmHg (ref 32.0–48.0)
pH, Arterial: 7.441 (ref 7.350–7.450)
pO2, Arterial: 70.6 mmHg — ABNORMAL LOW (ref 83.0–108.0)

## 2021-03-23 LAB — CBC WITH DIFFERENTIAL/PLATELET
Abs Immature Granulocytes: 0.07 10*3/uL (ref 0.00–0.07)
Basophils Absolute: 0 10*3/uL (ref 0.0–0.1)
Basophils Relative: 0 %
Eosinophils Absolute: 0 10*3/uL (ref 0.0–0.5)
Eosinophils Relative: 0 %
HCT: 34.9 % — ABNORMAL LOW (ref 39.0–52.0)
Hemoglobin: 11.5 g/dL — ABNORMAL LOW (ref 13.0–17.0)
Immature Granulocytes: 1 %
Lymphocytes Relative: 4 %
Lymphs Abs: 0.4 10*3/uL — ABNORMAL LOW (ref 0.7–4.0)
MCH: 28.5 pg (ref 26.0–34.0)
MCHC: 33 g/dL (ref 30.0–36.0)
MCV: 86.4 fL (ref 80.0–100.0)
Monocytes Absolute: 0.2 10*3/uL (ref 0.1–1.0)
Monocytes Relative: 2 %
Neutro Abs: 10.9 10*3/uL — ABNORMAL HIGH (ref 1.7–7.7)
Neutrophils Relative %: 93 %
Platelets: 328 10*3/uL (ref 150–400)
RBC: 4.04 MIL/uL — ABNORMAL LOW (ref 4.22–5.81)
RDW: 15 % (ref 11.5–15.5)
WBC: 11.6 10*3/uL — ABNORMAL HIGH (ref 4.0–10.5)
nRBC: 0 % (ref 0.0–0.2)

## 2021-03-23 LAB — BASIC METABOLIC PANEL
Anion gap: 10 (ref 5–15)
BUN: 44 mg/dL — ABNORMAL HIGH (ref 8–23)
CO2: 26 mmol/L (ref 22–32)
Calcium: 8.7 mg/dL — ABNORMAL LOW (ref 8.9–10.3)
Chloride: 100 mmol/L (ref 98–111)
Creatinine, Ser: 2.54 mg/dL — ABNORMAL HIGH (ref 0.61–1.24)
GFR, Estimated: 27 mL/min — ABNORMAL LOW (ref >60)
Glucose, Bld: 260 mg/dL — ABNORMAL HIGH (ref 70–99)
Potassium: 3.7 mmol/L (ref 3.5–5.1)
Sodium: 136 mmol/L (ref 135–145)

## 2021-03-23 LAB — PROTIME-INR
INR: 2.5 — ABNORMAL HIGH (ref 0.8–1.2)
Prothrombin Time: 27 seconds — ABNORMAL HIGH (ref 11.4–15.2)

## 2021-03-23 LAB — C-REACTIVE PROTEIN: CRP: 0.6 mg/dL (ref <1.0)

## 2021-03-23 LAB — LACTIC ACID, PLASMA: Lactic Acid, Venous: 1.8 mmol/L (ref 0.5–1.9)

## 2021-03-23 LAB — TROPONIN I (HIGH SENSITIVITY): Troponin I (High Sensitivity): 1247 ng/L (ref <18)

## 2021-03-23 LAB — HEMOGLOBIN A1C
Hgb A1c MFr Bld: 9.4 % — ABNORMAL HIGH (ref 4.8–5.6)
Mean Plasma Glucose: 223.08 mg/dL

## 2021-03-23 LAB — MRSA NEXT GEN BY PCR, NASAL: MRSA by PCR Next Gen: NOT DETECTED

## 2021-03-23 LAB — MAGNESIUM: Magnesium: 2.1 mg/dL (ref 1.7–2.4)

## 2021-03-23 LAB — PROCALCITONIN: Procalcitonin: <0.1 ng/mL

## 2021-03-23 MED ORDER — POTASSIUM CHLORIDE 10 MEQ/100ML IV SOLN
10.0000 meq | Freq: Once | INTRAVENOUS | Status: AC
  Administered 2021-03-23: 10 meq via INTRAVENOUS
  Filled 2021-03-23: qty 100

## 2021-03-23 MED ORDER — LACTATED RINGERS IV SOLN: INTRAVENOUS | Status: AC

## 2021-03-23 MED ORDER — METHYLPREDNISOLONE SODIUM SUCC 125 MG IJ SOLR
120.0000 mg | Freq: Every day | INTRAMUSCULAR | Status: DC
  Administered 2021-03-23 – 2021-03-26 (×4): 120 mg via INTRAVENOUS
  Filled 2021-03-23 (×5): qty 2

## 2021-03-23 MED ORDER — GUAIFENESIN-DM 100-10 MG/5ML PO SYRP
5.0000 mL | ORAL_SOLUTION | ORAL | Status: DC | PRN
  Administered 2021-03-23 – 2021-03-26 (×11): 5 mL via ORAL
  Filled 2021-03-23 (×11): qty 5

## 2021-03-23 MED ORDER — IPRATROPIUM-ALBUTEROL 0.5-2.5 (3) MG/3ML IN SOLN
3.0000 mL | Freq: Four times a day (QID) | RESPIRATORY_TRACT | Status: DC
  Administered 2021-03-23: 3 mL via RESPIRATORY_TRACT
  Filled 2021-03-23: qty 3

## 2021-03-23 MED ORDER — POTASSIUM CHLORIDE CRYS ER 20 MEQ PO TBCR
40.0000 meq | EXTENDED_RELEASE_TABLET | Freq: Once | ORAL | Status: AC
  Administered 2021-03-23: 40 meq via ORAL
  Filled 2021-03-23: qty 2

## 2021-03-23 MED ORDER — IPRATROPIUM-ALBUTEROL 0.5-2.5 (3) MG/3ML IN SOLN
3.0000 mL | RESPIRATORY_TRACT | Status: DC | PRN
Start: 2021-03-23 — End: 2021-03-26

## 2021-03-23 NOTE — Progress Notes (Signed)
Patient has an upcoming appointment with [name] on 04/11/2021.  Nothing further needed.

## 2021-03-23 NOTE — Evaluation (Signed)
Physical Therapy Evaluation Patient Details Name: Eugene [name] MRN: [id number] DOB: 1956/03/03 Today's Date: 03/23/2021   History of Present Illness  65 y.o. male admitted 8/25 with acute exacerbation of idiopathic pulmonary fibrosis. PMH: Previously admitted 01/26/21 with acute CHF and ILD, CVA, CAD s/p CABG, CHF, CKD, DDD, diabetes, GERD, HLD, PAD, HTN, cryptogenic stroke and A fib s/p AICD  Clinical Impression  Pt limited by fatigue during session. Pt ambulated in room for 10-15' with RW before sats dropped to mid 80s. Required 1-36min recover to increase sats >90%. Pt's deficits include decreased activity tolerance, balance, and mobility. Pt educated on energy conservation and ambulating 10-15'. Pt would benefit from continued PT to improve upon listed deficits. Recommend HHPT, as pt has support at home and necessary equipment.    Follow Up Recommendations Home health PT    Equipment Recommendations  None recommended by PT    Recommendations for Other Services       Precautions / Restrictions Precautions Precautions: Fall;Other (comment) Precaution Comments: watch sats, slow recovery and quick desaturation      Mobility  Bed Mobility Overal bed mobility: Modified Independent             General bed mobility comments: pt with desaturation to 84% on 5L with transition to EOB with HOB 35degrees, use of rail and increased time    Transfers Overall transfer level: Needs assistance   Transfers: Sit to/from Stand Sit to Stand: Supervision         General transfer comment: supervision for cords, pt able to stand from bed with good hand placement without physical assist  Ambulation/Gait Ambulation/Gait assistance: Supervision Gait Distance (Feet): 10 Feet Assistive device: Rolling walker (2 wheeled)     Gait velocity interpretation: <1.8 ft/sec, indicate of risk for recurrent falls General Gait Details: 1st traial pt with desaturation to 85% after 10' on 6L. cues for  breathing technique with chair pulled to pt. 1 min seated recovery to 91%. 2nd trial 30' prior to desaturation to 82% with 2 min recovery to 90%, 3rd trial 65' with drop to 87% after sitting with 1 min recovery. 4th trial 4' with >90% during gait with drop to 85% after seated with 45 sec recovery.. Pt educatd for need to continue energy conservation and limit gait to approximately 15' at a time due to known desaturation by this distance  Stairs            Wheelchair Mobility    Modified Rankin (Stroke Patients Only)       Balance Overall balance assessment: Needs assistance Sitting-balance support: No upper extremity supported Sitting balance-Leahy Scale: Good     Standing balance support: Bilateral upper extremity supported Standing balance-Leahy Scale: Poor Standing balance comment: RW for standing and gait                             Pertinent Vitals/Pain Pain Assessment: No/denies pain    Home Living Family/patient expects to be discharged to:: Private residence Living Arrangements: Spouse/significant other Available Help at Discharge: Available PRN/intermittently Type of Home: House Home Access: Stairs to enter;Ramped entrance Entrance Stairs-Rails: Can reach both Entrance Stairs-Number of Steps: 4 Home Layout: One level Home Equipment: Toilet riser;Shower seat;Walker - 2 wheels;Walker - 4 wheels;Cane - single point;Grab bars - toilet;Grab bars - tub/shower      Prior Function Level of Independence: Needs assistance      ADL's / Homemaking Assistance Needed: Wife and sister  helping with cleaning  Comments: Says could barely get around during past week; Wife and brother helping with equipment for transfers and gait     Hand Dominance        Extremity/Trunk Assessment   Upper Extremity Assessment Upper Extremity Assessment: Generalized weakness    Lower Extremity Assessment Lower Extremity Assessment: Generalized weakness    Cervical /  Trunk Assessment Cervical / Trunk Assessment: Kyphotic  Communication   Communication: No difficulties  Cognition Arousal/Alertness: Awake/alert Behavior During Therapy: WFL for tasks assessed/performed Overall Cognitive Status: Within Functional Limits for tasks assessed                                        General Comments      Exercises     Assessment/Plan    PT Assessment Patient needs continued PT services  PT Problem List Decreased activity tolerance;Cardiopulmonary status limiting activity;Decreased mobility;Decreased strength       PT Treatment Interventions DME instruction;Therapeutic exercise;Gait training;Functional mobility training;Therapeutic activities;Patient/family education    PT Goals (Current goals can be found in the Care Plan section)  Acute Rehab PT Goals Patient Stated Goal: return to the house and walk in the yard PT Goal Formulation: With patient Time For Goal Achievement: 04/06/21 Potential to Achieve Goals: Fair    Frequency Min 3X/week   Barriers to discharge        Co-evaluation               AM-PAC PT "6 Clicks" Mobility  Outcome Measure Help needed turning from your back to your side while in a flat bed without using bedrails?: A Little Help needed moving from lying on your back to sitting on the side of a flat bed without using bedrails?: A Little Help needed moving to and from a bed to a chair (including a wheelchair)?: A Little Help needed standing up from a chair using your arms (e.g., wheelchair or bedside chair)?: A Little Help needed to walk in hospital room?: A Little Help needed climbing 3-5 steps with a railing? : A Little 6 Click Score: 18    End of Session Equipment Utilized During Treatment: Gait belt;Oxygen Activity Tolerance: Patient limited by fatigue Patient left: in chair;with call bell/phone within reach;with chair alarm set Nurse Communication: Mobility status PT Visit Diagnosis: Other  abnormalities of gait and mobility (R26.89);Difficulty in walking, not elsewhere classified (R26.2)    Time: 9892-1194 PT Time Calculation (min) (ACUTE ONLY): 32 min   Charges:   PT Evaluation $PT Eval Moderate Complexity: 1 Mod PT Treatments $Gait Training: 8-22 mins        Louie Casa, SPT Acute Rehab: [phone]   Domingo Dimes 03/23/2021, 12:26 PM

## 2021-03-23 NOTE — Progress Notes (Signed)
Progress Note  Patient Name: Eugene [name] Date of Encounter: 03/23/2021  Primary Cardiologist: Werner Lean, MD   Subjective   Stable troponin elevation over night.  On heparin and plavix.  Kidney function has improved with IVF. Patient notes that has long as he does not move, he can maintain sats of 88% on 2 L.  With any purposeful movement.  O2 requirement can reach 9 L.    Inpatient Medications    Scheduled Meds:  clopidogrel  75 mg Oral Daily   insulin aspart  0-15 Units Subcutaneous TID AC & HS   insulin glargine-yfgn  60 Units Subcutaneous Daily   ipratropium-albuterol  3 mL Nebulization Q6H   methylPREDNISolone (SOLU-MEDROL) injection  120 mg Intravenous Daily   metoprolol succinate  50 mg Oral BID   mycophenolate  1,000 mg Oral BID   pantoprazole  40 mg Oral Daily   ranolazine  500 mg Oral BID   rosuvastatin  40 mg Oral Daily   sodium chloride flush  3 mL Intravenous Q12H   Continuous Infusions:  sodium chloride     lactated ringers 100 mL/hr at 03/23/21 0607   PRN Meds: sodium chloride, acetaminophen, guaiFENesin-dextromethorphan, ipratropium-albuterol, nitroGLYCERIN, ondansetron (ZOFRAN) IV, polyethylene glycol, sodium chloride flush   Vital Signs    Vitals:   03/22/21 2357 03/23/21 0354 03/23/21 0814 03/23/21 0854  BP: 119/79 108/75  100/64  Pulse: 73 70  79  Resp: (!) 22 20  20   Temp: 97.6 F (36.4 C) 97.6 F (36.4 C)  97.6 F (36.4 C)  TempSrc: Oral Oral  Oral  SpO2: 93% 96% 93% 94%  Weight:      Height:        Intake/Output Summary (Last 24 hours) at 03/23/2021 0903 Last data filed at 03/23/2021 4315 Gross per 24 hour  Intake 860 ml  Output 225 ml  Net 635 ml   Filed Weights   03/22/21 1856 03/22/21 2319  Weight: 75.8 kg 73.2 kg    Telemetry    SR with rare PVC - Personally Reviewed  ECG    SR rate 77 RBBB and LAFB (BIFB) - Personally Reviewed  Physical Exam   GEN: No acute distress.   Neck: No JVD Cardiac: RRR, no  murmurs, rubs, or gallops.  Respiratory: Coarse breath sounds bilaterally with minimally air movement GI: Soft, nontender, non-distended  MS: No edema; No deformity. Neuro:  Nonfocal  Psych: Normal affect   Labs    Chemistry Recent Labs  Lab 03/22/21 1244 03/22/21 1905 03/23/21 0131  NA 133* 134* 137  K 3.1* 2.8* 2.6*  CL 92* 94* 100  CO2 26 27 27   GLUCOSE 413* 425* 179*  BUN 58* 56* 48*  CREATININE 2.80* 2.83* 2.64*  CALCIUM 9.1 9.4 9.1  PROT 6.4 6.3* 5.8*  ALBUMIN 3.5 3.3* 3.0*  AST 28 32 25  ALT 53 61* 55*  ALKPHOS 79 76 72  BILITOT 0.6 0.9 0.6  GFRNONAA  --  24* 26*  ANIONGAP  --  13 10     Hematology Recent Labs  Lab 03/22/21 1244 03/22/21 1905 03/23/21 0131  WBC 13.5* 13.4* 11.6*  RBC 4.02* 4.36 4.04*  HGB 11.3* 12.4* 11.5*  HCT 35.0* 38.6* 34.9*  MCV 87.2 88.5 86.4  MCH  --  28.4 28.5  MCHC 32.4 32.1 33.0  RDW 16.3* 15.4 15.0  PLT 353.0 356 328    Cardiac EnzymesNo results for input(s): TROPONINI in the last 168 hours. No results for input(s):  TROPIPOC in the last 168 hours.   BNP Recent Labs  Lab 03/22/21 1244 03/22/21 1905  BNP  --  715.9*  PROBNP 821.0*  --      DDimer No results for input(s): DDIMER in the last 168 hours.   Radiology    DG Chest Port 1 View  Result Date: 03/22/2021 CLINICAL DATA:  Worsening shortness of breath over the past week EXAM: PORTABLE CHEST 1 VIEW COMPARISON:  01/26/2021, chest CT from earlier in the same day. FINDINGS: Cardiac shadow remains enlarged. Postsurgical changes are noted. Defibrillator and loop recorder are seen and stable. Diffuse chronic fibrotic changes are identified bilaterally although some suggestion of alveolar density is noted bilaterally likely representing some superimposed edema. No sizable effusion is noted. No bony abnormality is seen. IMPRESSION: Chronic fibrotic changes with mild superimposed edema. Electronically Signed   By: Inez Catalina M.D.   On: 03/22/2021 19:41    Cardiac  Studies   09-12-20 LHC Prox LAD lesion is 99% stenosed. Ost LAD to Prox LAD lesion is 100% stenosed. Mid LAD lesion is 80% stenosed. 1st Mrg lesion is 99% stenosed. Ost Cx to Prox Cx lesion is 80% stenosed. Mid Cx lesion is 100% stenosed. Balloon angioplasty was performed. Prox RCA to Dist RCA lesion is 100% stenosed with 100% stenosed side branch in RPAV. Ost Ramus lesion is 100% stenosed. SVG and is normal in caliber. Previously placed Prox Graft drug eluting stent is widely patent. LIMA and is large. The graft exhibits no disease. SVG and is normal in caliber. Origin to Prox Graft lesion is 100% stenosed. SVG and is large. Prox Graft to Dist Graft lesion is 100% stenosed. Dist Cx lesion is 95% stenosed. Mid LM to Dist LM lesion is 80% stenosed. Post intervention, there is a 20% residual stenosis.   Severe native coronary obstructive disease 80% distal left main stenosis, total occlusion of the LAD, ramus immediate vessel, high-grade circumflex stenoses, total occlusion of the proximal RCA.  There was a shunt to the distal RCA via left to right and right to right collateralization.   Patent LIMA graft supplying the mid LAD with diffuse disease in the LAD after insertion.   Supplying the OM 2 vessel with widely patent stent in the proximal portion of the SVG.  There is 95% in-stent restenosis in the native OM 2 vessel beyond the anastomosis.   Old occlusion of the vein graft which had supplied the ramus vessel.   Old occlusion of the vein graft which had supplied the PDA.   Difficult but ultimately successful intervention through the SVG to the distal OM 2 vessel requiring PTCA, [name] Cutting Balloon, and ultimate noncompliant balloon dilatation with a 95% stenosis being reduced to less than 20%.   LVEDP: 11 mmHg   RECOMMENDATION: DAPT probably indefinitely.  Since the patient developed early in-stent restenosis on aspirin/Plavix, Brilinta was utilized for more aggressive  antiplatelet therapy.  Medical therapy for concomitant CAD.  Optimal blood pressure control, diabetes management, and aggressive lipid therapy with target LDL less than 70.   01-27-21 TTE 1. Left ventricular ejection fraction, by estimation, is 45 to 50%. The  left ventricle has mildly decreased function. The left ventricle  demonstrates regional wall motion abnormalities.      There is hypokinesis of the basal-to-mid anteroseptal, basal-to-mid  anterior, basal-to-mid inferior and basal inferoseptal LV walls. Left  ventricular diastolic parameters are consistent with Grade I diastolic  dysfunction (impaired relaxation).   2. Right ventricular systolic function is mildly  reduced. The right  ventricular size is not well visualized.   3. The mitral valve is grossly normal. Trivial mitral valve  regurgitation. No evidence of mitral stenosis.   4. The aortic valve is tricuspid. There is mild calcification of the  aortic valve. There is moderate thickening of the aortic valve. Aortic  valve regurgitation is not visualized. Mild to moderate aortic valve  sclerosis/calcification is present, without  any evidence of aortic stenosis.   Patient Profile     65 y.o. 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, on rosuvastatin; cannot afford PSCK9i he was previously on - had anxiety attacks with Brilinta - if need more aggressive medical  strategy, may need Effient - continue ranolazine 500 mg PO BID  PAF CHADSVASC 7 - on AC as above - continue metoprolol succinate 50 mg (BID dose is atypical but he has tolerated it well)  HFmrEF CKD Stage IIIA HTN with DM - concern for intravascular depletion and s/p IVF - holding his lasix 40 mg PO Daily - cautious uptitation of GDMT given kidneys - outpatietn SGLT2i may be considered  VF and VT s/p ICD - presently shock free on BB - avoid amio give IPF - if worsening VT/VF mexilitine would be considered   IPF Per pulm or primary CT report (unable to pull up images) is concerning for advanced stages of ILD  Has had four admission ins the last year.  High risk of re-admission or decline. Dicussed with at length with patient.  For questions or updates, please contact [place] Please consult [email or web address] for contact info under Cardiology/STEMI.      Signed, Werner Lean, MD  03/23/2021, 9:03 AM

## 2021-03-23 NOTE — TOC Benefit Eligibility Note (Addendum)
Patient Teacher, English as a foreign language completed.    The patient is currently admitted and upon discharge could be taking Eliquis 5 mg.  The current 30 day co-pay is, $141.59. Due to being in Coverage Gap (donut hole).  The patient is currently admitted and upon discharge could be taking Xarelto 20 mg.  The current 30 day co-pay is, $138.28. Due to being in Coverage Gap (donut hole).  The patient is currently admitted and upon discharge could be taking Jardiance 10 mg.  The current 30 day co-pay is, $152.68. Due to being in Coverage Gap (donut hole).  The patient is currently admitted and upon discharge could be taking Farxiga 10 mg.  The current 30 day co-pay is, $146.85. Due to being in Coverage Gap (donut hole).  The patient is insured through [place] [place] Patient Advocate Specialist [place] Team Direct Number: [phone]  Fax: [phone]

## 2021-03-23 NOTE — Progress Notes (Signed)
Critical value- Potassium 2.6- notified [facility] on call

## 2021-03-23 NOTE — Progress Notes (Signed)
Patient has a f/u with MR on 9/14 scheduled already planned.  Nothing earlier available.

## 2021-03-23 NOTE — Progress Notes (Addendum)
PROGRESS NOTE  Eugene [name] BTD:[id number] DOB: 1956/04/19 DOA: 03/22/2021 PCP: [name], MD   LOS: 1 day   Brief narrative:  65 year old male with past medical history of chronic idiopathic pulmonary fibrosis, chronic hypoxic respiratory failure (on 3lpm  at rest and 6lpm with exertion via  at baseline), ischemic cardiomyopathy with systolic congestive heart failure  (last echo 01/2021 EF 45-50% with G1DD), coronary artery disease (S/P CABG 2006, cath with DES to SVG and OM2 in 06/2020 and another NSTEMI S/P cath 08/2020),  V. fib arrest (2012 S/P ICD palcement), cryptogenic stroke 11/2017, hyperlipidemia, chronic kidney disease stage IIIb, insulin-dependent diabetes mellitus type 2, paroxysmal atrial fibrillation presented to hospital due to worsening shortness of breath and increasing oxygen demand from pulmonary office as well as increasing BUN and creatinine.  Patient does have dyspnea on exertion and has been following up with [name] as outpatient.  He is shortness of breath and overall functioning has deteriorated.  He has been using up to 9 L of oxygen at home and is able to ambulate a little.  He did have a telemedicine visit on 8/22 and his prednisone was increased to 20 mg a day.  Denies any PND, fever or orthopnea.   In the ED, chest x-ray was concerning for pulmonary fibrosis.  Troponin was elevated at 1268.  Patient received 125 mg of IV Solu-Medrol and was admitted to hospital.  He also received IV fluids.  Cardiology was consulted for elevated troponin and the patient was admitted to hospital.  Assessment/Plan:  Principal Problem:   Acute exacerbation of idiopathic pulmonary fibrosis ([place]) Active Problems:   Chronic systolic CHF (congestive heart failure) (HCC)   Acute renal failure superimposed on stage 3b chronic kidney disease (HCC)   Leukocytosis   Elevated troponin   Lactic acidosis   Hypokalemia, inadequate intake   Type 2 diabetes mellitus with stage 3b  chronic kidney disease, with long-term current use of insulin (HCC)  Acute exacerbation of idiopathic pulmonary fibrosis   Patient follows up with [name] as outpatient.  Has been having several months of progressive shortness of breath dry cough.  Mildly elevated BNP.  Procalcitonin negative.  Continue IV steroids.  CT of the chest shows worsening pulmonary fibrosis.  Currently on 8 L of oxygen by nasal cannula.  Will likely need pulmonary consultation if not improving.  Blood cultures negative in less than 12 hours    Elevated troponin likely demand ischemia. Extensive cardiac history with CABG/AICD.  Seen by cardiology.  On heparin infusion.  No active chest pain.  Cardiology recommends DAPT currently on Plavix.  Might need event as per cardiology patient is also on anticoagulation..  Continue medical management.  2D echocardiogram from 01/27/2021 showed LV ejection fraction of 45 to 50%.  As per cardiology could transition back to Coumadin.  Continue ranolazine.    Lactic acidosis Received IV fluids.  Will repeat lactate.     Chronic systolic CHF  Currently getting gentle IV fluids.    Acute kidney injury superimposed on stage 3b chronic kidney disease   On gentle IV fluid.  Reassess fluid needs in the morning.  Patient likely had volume depletion due to poor oral intake.  Strict intake and output charting.  Daily weights    Leukocytosis Likely reactive from steroid usage.  Procalcitonin negative.  CRP 0.6 Unlikely to be infection.    Hypokalemia, Continue replacement with magnesium sulfate and potassium.  Check electrolytes in a.m. and p.m.  Type 2 diabetes mellitus with stage 3b chronic kidney disease, with long-term current use of insulin   Continue Accu-Cheks, sliding scale insulin and basal insulin.  Hemoglobin A1c of 9.4.    DVT prophylaxis:   Heparin drip  Code Status: Full code  Family Communication:  I spoke with the patient's spouse on the phone and updated her  about the clinical condition of the patient.  Also answered queries she had.  Status is: Inpatient  Remains inpatient appropriate because:IV treatments appropriate due to intensity of illness or inability to take PO, Inpatient level of care appropriate due to severity of illness, and electrolyte imbalance  Dispo: The patient is from: Home              Anticipated d/c is to: Home              Patient currently is not medically stable to d/c.   Difficult to place patient No   Consultants: Cardiology  Procedures: None  Anti-infectives:  None  Anti-infectives (From admission, onward)    None      Subjective:  Today, patient was seen and examined at bedside.  Patient feels little better with breathing but still feels short of breath.  Continues to have cough.  Denies any chest pain, dizziness, lightheadedness fever or chills.  Objective: Vitals:   03/23/21 0854 03/23/21 1044  BP: 100/64   Pulse: 79 84  Resp: 20   Temp: 97.6 F (36.4 C)   SpO2: 94% 93%    Intake/Output Summary (Last 24 hours) at 03/23/2021 1059 Last data filed at 03/23/2021 4765 Gross per 24 hour  Intake 860 ml  Output 225 ml  Net 635 ml   Filed Weights   03/22/21 1856 03/22/21 2319  Weight: 75.8 kg 73.2 kg   Body mass index is 26.85 kg/m.   Physical Exam:  GENERAL: Patient is alert awake and oriented. Not in obvious distress.  On nasal cannula oxygen HENT: No scleral pallor or icterus. Pupils equally reactive to light. Oral mucosa is moist.  Scab in the lower part of the neck. NECK: is supple, no gross swelling noted. CHEST: Diminished breath sounds bilaterally.  Crackles noted especially over the bases..  Chest wall AICD.  Scar from previous surgery CVS: S1 and S2 heard, no murmur. Regular rate and rhythm.  ABDOMEN: Soft, non-tender, bowel sounds are present. EXTREMITIES: No edema. CNS: Cranial nerves are intact. No focal motor deficits. SKIN: warm and dry without rashes.  Data Review: I  have personally reviewed the following laboratory data and studies,  CBC: Recent Labs  Lab 03/22/21 1244 03/22/21 1905 03/23/21 0131  WBC 13.5* 13.4* 11.6*  NEUTROABS 12.5* 11.3* 10.9*  HGB 11.3* 12.4* 11.5*  HCT 35.0* 38.6* 34.9*  MCV 87.2 88.5 86.4  PLT 353.0 356 465   Basic Metabolic Panel: Recent Labs  Lab 03/22/21 1244 03/22/21 1905 03/23/21 0131  NA 133* 134* 137  K 3.1* 2.8* 2.6*  CL 92* 94* 100  CO2 26 27 27   GLUCOSE 413* 425* 179*  BUN 58* 56* 48*  CREATININE 2.80* 2.83* 2.64*  CALCIUM 9.1 9.4 9.1  MG  --   --  2.1   Liver Function Tests: Recent Labs  Lab 03/22/21 1244 03/22/21 1905 03/23/21 0131  AST 28 32 25  ALT 53 61* 55*  ALKPHOS 79 76 72  BILITOT 0.6 0.9 0.6  PROT 6.4 6.3* 5.8*  ALBUMIN 3.5 3.3* 3.0*   No results for input(s): LIPASE, AMYLASE in the last  168 hours. No results for input(s): AMMONIA in the last 168 hours. Cardiac Enzymes: No results for input(s): CKTOTAL, CKMB, CKMBINDEX, TROPONINI in the last 168 hours. BNP (last 3 results) Recent Labs    09/16/20 0354 01/26/21 1431 03/22/21 1905  BNP 1,504.0* 419.9* 715.9*    ProBNP (last 3 results) Recent Labs    07/26/20 1033 08/04/20 1127 03/22/21 1244  PROBNP 2,139* 2,329* 821.0*    CBG: Recent Labs  Lab 03/23/21 0606  GLUCAP 188*   Recent Results (from the past 240 hour(s))  Culture, blood (routine x 2)     Status: None (Preliminary result)   Collection Time: 03/22/21  7:50 PM   Specimen: BLOOD  Result Value Ref Range Status   Specimen Description BLOOD RIGHT ANTECUBITAL  Final   Special Requests   Final    BOTTLES DRAWN AEROBIC AND ANAEROBIC Blood Culture results may not be optimal due to an inadequate volume of blood received in culture bottles   Culture   Final    NO GROWTH < 12 HOURS Performed at [facility] [address] [place], [place] [address]    Report Status PENDING  Incomplete  Resp Panel by RT-PCR (Flu A&B, Covid) Nasopharyngeal Swab      Status: None   Collection Time: 03/22/21  7:57 PM   Specimen: Nasopharyngeal Swab; Nasopharyngeal(NP) swabs in vial transport medium  Result Value Ref Range Status   SARS Coronavirus 2 by RT PCR NEGATIVE NEGATIVE Final    Comment: (NOTE) SARS-CoV-2 target nucleic acids are NOT DETECTED.  The SARS-CoV-2 RNA is generally detectable in upper respiratory specimens during the acute phase of infection. The lowest concentration of SARS-CoV-2 viral copies this assay can detect is 138 copies/mL. A negative result does not preclude SARS-Cov-2 infection and should not be used as the sole basis for treatment or other patient management decisions. A negative result may occur with  improper specimen collection/handling, submission of specimen other than nasopharyngeal swab, presence of viral mutation(s) within the areas targeted by this assay, and inadequate number of viral copies(<138 copies/mL). A negative result must be combined with clinical observations, patient history, and epidemiological information. The expected result is Negative.  Fact Sheet for Patients:  EntrepreneurPulse.com.au  Fact Sheet for Healthcare Providers:  IncredibleEmployment.be  This test is no t yet approved or cleared by the [place] FDA and  has been authorized for detection and/or diagnosis of SARS-CoV-2 by FDA under an Emergency Use Authorization (EUA). This EUA will remain  in effect (meaning this test can be used) for the duration of the COVID-19 declaration under Section 564(b)(1) of the Act, 21 U.S.C.section 360bbb-3(b)(1), unless the authorization is terminated  or revoked sooner.       Influenza A by PCR NEGATIVE NEGATIVE Final   Influenza B by PCR NEGATIVE NEGATIVE Final    Comment: (NOTE) The Xpert Xpress SARS-CoV-2/FLU/RSV plus assay is intended as an aid in the diagnosis of influenza from Nasopharyngeal swab specimens and should not be used as a sole basis  for treatment. Nasal washings and aspirates are unacceptable for Xpert Xpress SARS-CoV-2/FLU/RSV testing.  Fact Sheet for Patients: EntrepreneurPulse.com.au  Fact Sheet for Healthcare Providers: IncredibleEmployment.be  This test is not yet approved or cleared by the [place] FDA and has been authorized for detection and/or diagnosis of SARS-CoV-2 by FDA under an Emergency Use Authorization (EUA). This EUA will remain in effect (meaning this test can be used) for the duration of the COVID-19 declaration under Section 564(b)(1) of  the Act, 21 U.S.C. section 360bbb-3(b)(1), unless the authorization is terminated or revoked.  Performed at [facility], [place] [address], [place], [place] [address]   Culture, blood (routine x 2)     Status: None (Preliminary result)   Collection Time: 03/22/21  8:04 PM   Specimen: BLOOD  Result Value Ref Range Status   Specimen Description BLOOD LEFT ANTECUBITAL  Final   Special Requests   Final    BOTTLES DRAWN AEROBIC AND ANAEROBIC Blood Culture results may not be optimal due to an inadequate volume of blood received in culture bottles   Culture   Final    NO GROWTH < 12 HOURS Performed at [facility], [place] [address] [place], [place] [address]    Report Status PENDING  Incomplete  MRSA Next Gen by PCR, Nasal     Status: None   Collection Time: 03/22/21 11:15 PM   Specimen: Nasal Mucosa; Nasal Swab  Result Value Ref Range Status   MRSA by PCR Next Gen NOT DETECTED NOT DETECTED Final    Comment: (NOTE) The GeneXpert MRSA Assay (FDA approved for NASAL specimens only), is one component of a comprehensive MRSA colonization surveillance program. It is not intended to diagnose MRSA infection nor to guide or monitor treatment for MRSA infections. Test performance is not FDA approved in patients less than 70 years old. Performed at [facility], [place] [address] [place], [place] [address]       Studies: CT CHEST HIGH RESOLUTION  Result Date: 03/23/2021 CLINICAL DATA:  65 year old male with history of increasing shortness of breath with exertion for the past 5-7 days. EXAM: CT CHEST WITHOUT CONTRAST TECHNIQUE: Multidetector CT imaging of the chest was performed following the standard protocol without intravenous contrast. High resolution imaging of the lungs, as well as inspiratory and expiratory imaging, was performed. COMPARISON:  Chest CT 09/09/2020. FINDINGS: Cardiovascular: Heart size is normal. There is no significant pericardial fluid, thickening or pericardial calcification. There is aortic atherosclerosis, as well as atherosclerosis of the great vessels of the mediastinum and the coronary arteries, including calcified atherosclerotic plaque in the left main, left anterior descending, left circumflex and right coronary arteries. Status post median sternotomy for CABG including LIMA to the LAD. Calcifications of the aortic valve and inferior mitral annulus. Left-sided pacemaker/AICD with lead tip terminating in the right ventricular apex. Mediastinum/Nodes: No pathologically enlarged mediastinal or hilar lymph nodes. Please note that accurate exclusion of hilar adenopathy is limited on noncontrast CT scans. Esophagus is unremarkable in appearance. No axillary lymphadenopathy. Lungs/Pleura: High-resolution images demonstrate marked progression of widespread areas of ground-glass attenuation, septal thickening, subpleural reticulation, parenchymal banding, traction bronchiectasis, peripheral bronchiolectasis, and areas of developing honeycombing. These findings have a definitive craniocaudal gradient. Inspiratory and expiratory imaging is unremarkable. No acute consolidative airspace disease. No pleural effusions. In the posterior aspect of the right upper lobe (axial image 30 of series 3) there is a new 1.5 x 0.9 cm pleural based nodular density. No other definite suspicious appearing pulmonary  nodules or masses are confidently identified upon this background of advanced interstitial lung disease. Upper Abdomen: Aortic atherosclerosis. Calcified gallstones lying dependently in the gallbladder. Musculoskeletal: Median sternotomy wires. Electronic device in the subcutaneous fat of the medial lower left hemithorax, presumably an implantable loop recorder. There are no aggressive appearing lytic or blastic lesions noted in the visualized portions of the skeleton. IMPRESSION: 1. The appearance of the lungs is now considered diagnostic of usual interstitial pneumonia (UIP)  per current ATS guidelines, with marked progression compared to the prior study from 09/09/2020, as above. 2. Interval development of a pleural-based nodular density in the posterior aspect of the right upper lobe. This may simply represent an area of developing fibrosis, however, the possibility of neoplasm is not entirely excluded. Further evaluation with nonemergent PET-CT should be considered if clinically appropriate. 3. Aortic atherosclerosis, in addition to left main and 3 vessel coronary artery disease. Status post median sternotomy for CABG including LIMA to the LAD. 4. There are calcifications of the aortic valve and mitral annulus. Echocardiographic correlation for evaluation of potential valvular dysfunction may be warranted if clinically indicated. 5. Cholelithiasis. Aortic Atherosclerosis (ICD10-I70.0). Electronically Signed   By: Vinnie Langton M.D.   On: 03/23/2021 09:16   DG Chest Port 1 View  Result Date: 03/22/2021 CLINICAL DATA:  Worsening shortness of breath over the past week EXAM: PORTABLE CHEST 1 VIEW COMPARISON:  01/26/2021, chest CT from earlier in the same day. FINDINGS: Cardiac shadow remains enlarged. Postsurgical changes are noted. Defibrillator and loop recorder are seen and stable. Diffuse chronic fibrotic changes are identified bilaterally although some suggestion of alveolar density is noted bilaterally  likely representing some superimposed edema. No sizable effusion is noted. No bony abnormality is seen. IMPRESSION: Chronic fibrotic changes with mild superimposed edema. Electronically Signed   By: Inez Catalina M.D.   On: 03/22/2021 19:41      Flora Lipps, MD  [facility] 03/23/2021  If 7PM-7AM, please contact night-coverage

## 2021-03-23 NOTE — Progress Notes (Addendum)
ANTICOAGULATION CONSULT NOTE  Pharmacy Consult for Heparin Indication: chest pain/ACS  No Known Allergies  Patient Measurements: Height: 5\' 5"  (165.1 cm) Weight: 73.2 kg (161 lb 6 oz) IBW/kg (Calculated) : 61.5  Vital Signs: Temp: 97.6 F (36.4 C) (08/26 0854) Temp Source: Oral (08/26 0854) BP: 100/64 (08/26 0854) Pulse Rate: 79 (08/26 0854)  Labs: Recent Labs    03/22/21 1244 03/22/21 1905 03/22/21 1926 03/22/21 2130 03/22/21 2210 03/23/21 0131 03/23/21 0434  HGB 11.3* 12.4*  --   --   --  11.5*  --   HCT 35.0* 38.6*  --   --   --  34.9*  --   PLT 353.0 356  --   --   --  328  --   LABPROT 36.2*  --  14.4  --  27.4* 27.0*  --   INR 3.6*  --  1.1  --  2.5* 2.5*  --   CREATININE 2.80* 2.83*  --   --   --  2.64*  --   TROPONINIHS  --  1,268*  --  1,218*  --   --  1,247*     Estimated Creatinine Clearance: 24.3 mL/min (A) (by C-G formula based on SCr of 2.64 mg/dL (H)).   Medical History: Past Medical History:  Diagnosis Date   Abnormal nuclear stress test 02/19/2018   Angina, class III ([place]) 02/19/2018   Anxiety    Chronic combined systolic and diastolic CHF (congestive heart failure) (HCC)    Chronic kidney disease, stage 3b (HCC)    Coronary artery disease    DDD (degenerative disc disease), cervical 03/01/2013   Diabetes mellitus    type 2   Diverticular disease 01/12/2014   mild   DVT (deep venous thrombosis) (HCC)    Dyslipidemia    Hypertension    ICD (implantable cardioverter-defibrillator) in place    ILD (interstitial lung disease) ([place])    Lumbosacral radiculopathy at L4 02/18/2017   Myocardial infarction ([place]) 2012   Obesity    Snoring 10/12/2013   TIA (transient ischemic attack) 08/09/2014   Tubular adenoma of colon 01/12/2014   transverse colon   VF (ventricular fibrillation) ([place])    arrest   Vitamin D deficiency 02/18/2017    Medications:  Medications Prior to Admission  Medication Sig Dispense Refill Last Dose   clopidogrel  (PLAVIX) 75 MG tablet Take 75 mg by mouth daily.   03/22/2021   furosemide (LASIX) 40 MG tablet Take 40 mg by mouth daily.   03/22/2021   guaiFENesin-dextromethorphan (ROBITUSSIN DM) 100-10 MG/5ML syrup Take 10 mLs by mouth every 6 (six) hours as needed for cough. 118 mL 1 Past Week   insulin glargine (LANTUS) 100 UNIT/ML Solostar Pen Inject 60 Units into the skin daily before breakfast.   03/22/2021   metoprolol succinate (TOPROL-XL) 50 MG 24 hr tablet Take 1 tablet (50 mg total) by mouth in the morning and at bedtime. Take with or immediately following a meal. 180 tablet 1 03/22/2021 at 0800   mycophenolate (CELLCEPT) 500 MG tablet Take 1,000 mg by mouth 2 (two) times daily.   03/22/2021   nitroGLYCERIN (NITROSTAT) 0.4 MG SL tablet PLACE 1 TABLET UNDER THE TONGUE EVERY 5 MINUTES AS NEEDED FOR CHEST PAIN 25 tablet 0 unknown   NOVOLOG FLEXPEN 100 UNIT/ML FlexPen Inject 0-10 Units into the skin 3 (three) times daily as needed for high blood sugar. Substitute to any brand approved.Before each meal 3 times a day, 140-199 - 2 units, 200-250 -  4 units, 251-299 - 6 units,  300-349 - 8 units,  350 or above 10 units. Dispense syringes and needles as needed, Ok to switch to PEN if approved. DX DM2, Code E11.65   03/21/2021   OZEMPIC, 1 MG/DOSE, 4 MG/3ML SOPN Inject 1 mg into the skin once a week.   Past Month   pantoprazole (PROTONIX) 40 MG tablet Take 40 mg by mouth daily.   03/22/2021   predniSONE (DELTASONE) 10 MG tablet 2 tabs daily for 2 weeks then 1 1/2 tab daily for 2 weeks then 1 tab daily 36 tablet 1 03/22/2021   ranolazine (RANEXA) 500 MG 12 hr tablet Take 1 tablet (500 mg total) by mouth 2 (two) times daily. TAKE ONE TABLET BY MOUTH TWICE DAILY.PLEASE MAKE APPT WITH DR.HARDING FOR REFILLS. 60 tablet 0 03/22/2021   rosuvastatin (CRESTOR) 40 MG tablet Take 1 tablet (40 mg total) by mouth daily. 90 tablet 3 03/22/2021   vitamin B-12 (CYANOCOBALAMIN) 1000 MCG tablet TAKE 1 TABLET (1,000 MCG TOTAL) BY MOUTH DAILY.  30 tablet 0 03/22/2021   warfarin (COUMADIN) 5 MG tablet Take 0.5-1 tablets (2.5-5 mg total) by mouth See admin instructions. Take 1 tablet (5 mg) daily except on Sun Take 1/2 tablet (2.5 mg)   03/21/2021 at 2030   benzonatate (TESSALON) 200 MG capsule Take 1 capsule (200 mg total) by mouth 3 (three) times daily as needed for cough. 45 capsule 1    insulin aspart (NOVOLOG) 100 UNIT/ML injection Substitute to any brand approved.Before each meal 3 times a day, 140-199 - 2 units, 200-250 - 4 units, 251-299 - 6 units,  300-349 - 8 units,  350 or above 10 units. Dispense syringes and needles as needed, Ok to switch to PEN if approved. DX DM2, Code E11.65 (Patient not taking: No sig reported) 10 mL 0 Not Taking   Insulin Syringe-Needle U-100 25G X 1" 1 ML MISC For 4 times a day insulin SQ, 1 month supply. Diagnosis E11.65 30 each 0    OXYGEN Inhale into the lungs. At 9 liters      predniSONE (DELTASONE) 5 MG tablet Take 1 tablet (5 mg total) by mouth daily with breakfast. (Patient not taking: No sig reported)   Not Taking   sulfamethoxazole-trimethoprim (BACTRIM DS) 800-160 MG tablet Take 1 tablet by mouth 3 (three) times a week. 12 tablet 1    Scheduled:  Infusions:   Assessment: 49 yom with a history of CAD, h/o CABG and PCI, h/o VF arrest s/p ICD implanted 2012, chronic combined HF. Patient presented to ED with SOB. Heparin infusion per pharmacy ordered for ACS. Plans noted for Echo  Patient is on warfarin prior to arrival. Last taken 8/24 @ 2030. Home dose 5mg  [id number]; 2.5mg  on Su. -INR= 2.4  As outpatient, plans were to start steroids with mycophenolate so bactrim was added for PCP prophylaxis. He has not started Bactrim yet.    Goal of Therapy:  Heparin level 0.3-0.7 units/ml Monitor platelets by anticoagulation protocol: Yes   Plan:  -Holding warfarin  -Hold heparin until INR <2    Hildred Laser, PharmD Clinical Pharmacist **Pharmacist phone directory can now be found on [email or web address] (PW  [email or web address]).  Listed under [place].

## 2021-03-24 ENCOUNTER — Other Ambulatory Visit (HOSPITAL_COMMUNITY): Payer: Medicare HMO

## 2021-03-24 ENCOUNTER — Inpatient Hospital Stay (HOSPITAL_COMMUNITY): Payer: Medicare HMO

## 2021-03-24 DIAGNOSIS — I214 Non-ST elevation (NSTEMI) myocardial infarction: Secondary | ICD-10-CM | POA: Diagnosis not present

## 2021-03-24 DIAGNOSIS — J84112 Idiopathic pulmonary fibrosis: Secondary | ICD-10-CM | POA: Diagnosis not present

## 2021-03-24 LAB — CBC
HCT: 31.7 % — ABNORMAL LOW (ref 39.0–52.0)
Hemoglobin: 10.1 g/dL — ABNORMAL LOW (ref 13.0–17.0)
MCH: 28.3 pg (ref 26.0–34.0)
MCHC: 31.9 g/dL (ref 30.0–36.0)
MCV: 88.8 fL (ref 80.0–100.0)
Platelets: 348 10*3/uL (ref 150–400)
RBC: 3.57 MIL/uL — ABNORMAL LOW (ref 4.22–5.81)
RDW: 15.4 % (ref 11.5–15.5)
WBC: 15.1 10*3/uL — ABNORMAL HIGH (ref 4.0–10.5)
nRBC: 0 % (ref 0.0–0.2)

## 2021-03-24 LAB — ECHOCARDIOGRAM LIMITED
AR max vel: 1.88 cm2
AV Area VTI: 1.86 cm2
AV Area mean vel: 1.75 cm2
AV Mean grad: 9 mmHg
AV Peak grad: 16.8 mmHg
Ao pk vel: 2.05 m/s
Area-P 1/2: 4.89 cm2
Calc EF: 44 %
Height: 65 in
S' Lateral: 3.9 cm
Single Plane A2C EF: 40.8 %
Single Plane A4C EF: 42.5 %
Weight: 2670.21 oz

## 2021-03-24 LAB — BASIC METABOLIC PANEL
Anion gap: 10 (ref 5–15)
BUN: 45 mg/dL — ABNORMAL HIGH (ref 8–23)
CO2: 27 mmol/L (ref 22–32)
Calcium: 9 mg/dL (ref 8.9–10.3)
Chloride: 100 mmol/L (ref 98–111)
Creatinine, Ser: 2.33 mg/dL — ABNORMAL HIGH (ref 0.61–1.24)
GFR, Estimated: 30 mL/min — ABNORMAL LOW (ref >60)
Glucose, Bld: 70 mg/dL (ref 70–99)
Potassium: 3.5 mmol/L (ref 3.5–5.1)
Sodium: 137 mmol/L (ref 135–145)

## 2021-03-24 LAB — GLUCOSE, CAPILLARY
Glucose-Capillary: 67 mg/dL — ABNORMAL LOW (ref 70–99)
Glucose-Capillary: 104 mg/dL — ABNORMAL HIGH (ref 70–99)
Glucose-Capillary: 82 mg/dL (ref 70–99)
Glucose-Capillary: 125 mg/dL — ABNORMAL HIGH (ref 70–99)
Glucose-Capillary: 182 mg/dL — ABNORMAL HIGH (ref 70–99)

## 2021-03-24 LAB — HEPARIN LEVEL (UNFRACTIONATED): Heparin Unfractionated: 0.16 IU/mL — ABNORMAL LOW (ref 0.30–0.70)

## 2021-03-24 LAB — PROTIME-INR
INR: 1.8 — ABNORMAL HIGH (ref 0.8–1.2)
Prothrombin Time: 21.2 seconds — ABNORMAL HIGH (ref 11.4–15.2)

## 2021-03-24 LAB — MAGNESIUM: Magnesium: 2 mg/dL (ref 1.7–2.4)

## 2021-03-24 MED ORDER — WARFARIN - PHARMACIST DOSING INPATIENT: Freq: Every day | Status: DC

## 2021-03-24 MED ORDER — SALINE SPRAY 0.65 % NA SOLN
1.0000 | NASAL | Status: DC | PRN
  Administered 2021-03-24 (×2): 1 via NASAL
  Filled 2021-03-24: qty 44

## 2021-03-24 MED ORDER — POTASSIUM CHLORIDE CRYS ER 20 MEQ PO TBCR
40.0000 meq | EXTENDED_RELEASE_TABLET | Freq: Every day | ORAL | Status: AC
  Administered 2021-03-24 – 2021-03-26 (×3): 40 meq via ORAL
  Filled 2021-03-24 (×3): qty 2

## 2021-03-24 MED ORDER — PERFLUTREN LIPID MICROSPHERE
1.0000 mL | INTRAVENOUS | Status: AC | PRN
  Administered 2021-03-24: 2 mL via INTRAVENOUS
  Filled 2021-03-24: qty 10

## 2021-03-24 MED ORDER — HEPARIN (PORCINE) 25000 UT/250ML-% IV SOLN
1200.0000 [IU]/h | INTRAVENOUS | Status: DC
  Administered 2021-03-24: 900 [IU]/h via INTRAVENOUS
  Administered 2021-03-25: 1050 [IU]/h via INTRAVENOUS
  Administered 2021-03-26: 1200 [IU]/h via INTRAVENOUS
  Filled 2021-03-24 (×3): qty 250

## 2021-03-24 MED ORDER — WARFARIN SODIUM 5 MG PO TABS
5.0000 mg | ORAL_TABLET | Freq: Once | ORAL | Status: AC
  Administered 2021-03-24: 5 mg via ORAL
  Filled 2021-03-24: qty 1

## 2021-03-24 NOTE — Progress Notes (Signed)
PROGRESS NOTE  Eugene [name] QTM:[id number] DOB: [birth year] DOA: 03/22/2021 PCP: [name], MD   LOS: 2 days   Brief narrative:  65 year old male with past medical history of chronic idiopathic pulmonary fibrosis, chronic hypoxic respiratory failure (on 3lpm  at rest and 6lpm with exertion via [place] at baseline), ischemic cardiomyopathy with systolic congestive heart failure  (last echo 01/2021 EF 45-50% with G1DD), coronary artery disease (S/P CABG 2006, cath with DES to SVG and OM2 in 06/2020 and another NSTEMI S/P cath 08/2020),  V. fib arrest (2012 S/P ICD palcement), cryptogenic stroke 11/2017, hyperlipidemia, chronic kidney disease stage IIIb, insulin-dependent diabetes mellitus type 2, paroxysmal atrial fibrillation presented to hospital due to worsening shortness of breath and increasing oxygen demand from pulmonary office as well as increasing BUN and creatinine.  Patient does have dyspnea on exertion and has been following up with [name] as outpatient.  He is shortness of breath and overall functioning has deteriorated.  He has been using up to 9 L of oxygen at home and is able to ambulate a little.  He did have a telemedicine visit on 8/22 and his prednisone was increased to 20 mg a day.  Denies any PND, fever or orthopnea.   In the ED, chest x-ray was concerning for pulmonary fibrosis.  Troponin was elevated at 1268.  Patient received 125 mg of IV Solu-Medrol and was admitted to hospital. Cardiology was consulted for elevated troponin and the patient was admitted to hospital.  Assessment/Plan:  Principal Problem:   Acute exacerbation of idiopathic pulmonary fibrosis (HCC) Active Problems:   Chronic systolic CHF (congestive heart failure) (HCC)   Acute renal failure superimposed on stage 3b chronic kidney disease (HCC)   Leukocytosis   Elevated troponin   Lactic acidosis   Hypokalemia, inadequate intake   Type 2 diabetes mellitus with stage 3b chronic kidney disease, with  long-term current use of insulin (HCC)  Acute exacerbation of idiopathic pulmonary fibrosis   Patient follows up with [name], pulmonary as outpatient.  Several months of progressive shortness of breath and dry cough.  Mildly elevated BNP.  Procalcitonin negative.  No indication of infection at this time.  Continue IV steroids.  CT of the chest shows worsening pulmonary fibrosis.  Currently on 6 L of oxygen by nasal cannula.  Will likely need pulmonary consultation if not improving.  Blood cultures negative in less than 12 hours.  We will continue to monitor closely   Elevated troponin likely demand ischemia. Extensive cardiac history with CABG/AICD.  Seen by cardiology.  On heparin infusion currently on Plavix.    2D echocardiogram from 01/27/2021 showed LV ejection fraction of 45 to 50%.    Continue ranolazine.  Defer further plan with cardiology.  Check 2D echocardiogram pending as per cardiology.    Lactic acidosis Received IV fluids.  Lactate of 1.8     Chronic systolic CHF  Off IV fluids.    Acute kidney injury superimposed on stage 3b chronic kidney disease  Received some IV fluids.  We will hold off we will continue to monitor BMP.  Strict intake and output charting.  Daily weights.  Creatinine of 2.3 from 2.5    Leukocytosis Likely reactive from steroid usage.  Procalcitonin negative.  CRP 0.6 Unlikely to be infection.    Hypokalemia, Received magnesium sulfate and potassium yesterday.  Has improved.  Continue oral potassium    Type 2 diabetes mellitus with stage 3b chronic kidney disease, with long-term current use of insulin  Continue Accu-Cheks, sliding scale insulin and basal insulin.  Hemoglobin A1c of 9.4.  Latest POC glucose of 104   DVT prophylaxis:   Heparin drip  Code Status: Full code  Family Communication:  I spoke with the patient's wife and sister at bedside  Status is: Inpatient  Remains inpatient appropriate because:IV treatments appropriate due to  intensity of illness or inability to take PO, Inpatient level of care appropriate due to severity of illness  Dispo: The patient is from: Home              Anticipated d/c is to: Home              Patient currently is not medically stable to d/c.   Difficult to place patient No   Consultants: Cardiology  Procedures: None  Anti-infectives:  None  Anti-infectives (From admission, onward)    None      Subjective:  Today, patient was seen and examined at bedside.  Patient feels little better with breathing.  Denies any chest pain, nausea, vomiting or fever.  Objective: Vitals:   03/24/21 0007 03/24/21 0413  BP: 99/66 101/73  Pulse: 62 (!) 55  Resp: 20 19  Temp: 97.7 F (36.5 C) (!) 97.3 F (36.3 C)  SpO2: 98% 96%    Intake/Output Summary (Last 24 hours) at 03/24/2021 0746 Last data filed at 03/24/2021 0240 Gross per 24 hour  Intake 3158.4 ml  Output 1875 ml  Net 1283.4 ml    Filed Weights   03/22/21 1856 03/22/21 2319 03/24/21 0413  Weight: 75.8 kg 73.2 kg 75.7 kg   Body mass index is 27.77 kg/m.   Physical Exam: GENERAL: Patient is alert awake and oriented. Not in obvious distress.  On nasal cannula oxygen HENT: No scleral pallor or icterus. Pupils equally reactive to light. Oral mucosa is moist.  Scab in the lower part of the neck. NECK: is supple, no gross swelling noted. CHEST: Diminished breath sounds bilaterally.  Crackles noted especially over the bases..  Chest wall AICD.  Scar from previous surgery CVS: S1 and S2 heard, no murmur. Regular rate and rhythm.  ABDOMEN: Soft, non-tender, bowel sounds are present. EXTREMITIES: No edema. CNS: Cranial nerves are intact. No focal motor deficits. SKIN: warm and dry without rashes.  Data Review: I have personally reviewed the following laboratory data and studies,  CBC: Recent Labs  Lab 03/22/21 1244 03/22/21 1905 03/23/21 0131 03/24/21 0050  WBC 13.5* 13.4* 11.6* 15.1*  NEUTROABS 12.5* 11.3* 10.9*   --   HGB 11.3* 12.4* 11.5* 10.1*  HCT 35.0* 38.6* 34.9* 31.7*  MCV 87.2 88.5 86.4 88.8  PLT 353.0 356 328 161    Basic Metabolic Panel: Recent Labs  Lab 03/22/21 1244 03/22/21 1905 03/23/21 0131 03/23/21 1518 03/24/21 0050  NA 133* 134* 137 136 137  K 3.1* 2.8* 2.6* 3.7 3.5  CL 92* 94* 100 100 100  CO2 26 27 27 26 27   GLUCOSE 413* 425* 179* 260* 70  BUN 58* 56* 48* 44* 45*  CREATININE 2.80* 2.83* 2.64* 2.54* 2.33*  CALCIUM 9.1 9.4 9.1 8.7* 9.0  MG  --   --  2.1  --  2.0    Liver Function Tests: Recent Labs  Lab 03/22/21 1244 03/22/21 1905 03/23/21 0131  AST 28 32 25  ALT 53 61* 55*  ALKPHOS 79 76 72  BILITOT 0.6 0.9 0.6  PROT 6.4 6.3* 5.8*  ALBUMIN 3.5 3.3* 3.0*    No results for input(s): LIPASE, AMYLASE  in the last 168 hours. No results for input(s): AMMONIA in the last 168 hours. Cardiac Enzymes: No results for input(s): CKTOTAL, CKMB, CKMBINDEX, TROPONINI in the last 168 hours. BNP (last 3 results) Recent Labs    09/16/20 0354 01/26/21 1431 03/22/21 1905  BNP 1,504.0* 419.9* 715.9*     ProBNP (last 3 results) Recent Labs    07/26/20 1033 08/04/20 1127 03/22/21 1244  PROBNP 2,139* 2,329* 821.0*     CBG: Recent Labs  Lab 03/23/21 1141 03/23/21 1615 03/23/21 2055 03/24/21 0440 03/24/21 0612  GLUCAP 189* 241* 193* 67* 104*    Recent Results (from the past 240 hour(s))  Culture, blood (routine x 2)     Status: None (Preliminary result)   Collection Time: 03/22/21  7:50 PM   Specimen: BLOOD  Result Value Ref Range Status   Specimen Description BLOOD RIGHT ANTECUBITAL  Final   Special Requests   Final    BOTTLES DRAWN AEROBIC AND ANAEROBIC Blood Culture results may not be optimal due to an inadequate volume of blood received in culture bottles   Culture   Final    NO GROWTH < 12 HOURS Performed at [facility] [address], [place],  [address]    Report Status PENDING  Incomplete  Resp Panel by RT-PCR (Flu A&B,  Covid) Nasopharyngeal Swab     Status: None   Collection Time: 03/22/21  7:57 PM   Specimen: Nasopharyngeal Swab; Nasopharyngeal(NP) swabs in vial transport medium  Result Value Ref Range Status   SARS Coronavirus 2 by RT PCR NEGATIVE NEGATIVE Final    Comment: (NOTE) SARS-CoV-2 target nucleic acids are NOT DETECTED.  The SARS-CoV-2 RNA is generally detectable in upper respiratory specimens during the acute phase of infection. The lowest concentration of SARS-CoV-2 viral copies this assay can detect is 138 copies/mL. A negative result does not preclude SARS-Cov-2 infection and should not be used as the sole basis for treatment or other patient management decisions. A negative result may occur with  improper specimen collection/handling, submission of specimen other than nasopharyngeal swab, presence of viral mutation(s) within the areas targeted by this assay, and inadequate number of viral copies(<138 copies/mL). A negative result must be combined with clinical observations, patient history, and epidemiological information. The expected result is Negative.  Fact Sheet for Patients:  EntrepreneurPulse.com.au  Fact Sheet for Healthcare Providers:  IncredibleEmployment.be  This test is no t yet approved or cleared by the [place] FDA and  has been authorized for detection and/or diagnosis of SARS-CoV-2 by FDA under an Emergency Use Authorization (EUA). This EUA will remain  in effect (meaning this test can be used) for the duration of the COVID-19 declaration under Section 564(b)(1) of the Act, 21 U.S.C.section 360bbb-3(b)(1), unless the authorization is terminated  or revoked sooner.       Influenza A by PCR NEGATIVE NEGATIVE Final   Influenza B by PCR NEGATIVE NEGATIVE Final    Comment: (NOTE) The Xpert Xpress SARS-CoV-2/FLU/RSV plus assay is intended as an aid in the diagnosis of influenza from Nasopharyngeal swab specimens and should  not be used as a sole basis for treatment. Nasal washings and aspirates are unacceptable for Xpert Xpress SARS-CoV-2/FLU/RSV testing.  Fact Sheet for Patients: EntrepreneurPulse.com.au  Fact Sheet for Healthcare Providers: IncredibleEmployment.be  This test is not yet approved or cleared by the [place] FDA and has been authorized for detection and/or diagnosis of SARS-CoV-2 by FDA under an Emergency Use Authorization (EUA). This EUA will remain in  effect (meaning this test can be used) for the duration of the COVID-19 declaration under Section 564(b)(1) of the Act, 21 U.S.C. section 360bbb-3(b)(1), unless the authorization is terminated or revoked.  Performed at [facility], [place] [address], [place], [place] [address]   Culture, blood (routine x 2)     Status: None (Preliminary result)   Collection Time: 03/22/21  8:04 PM   Specimen: BLOOD  Result Value Ref Range Status   Specimen Description BLOOD LEFT ANTECUBITAL  Final   Special Requests   Final    BOTTLES DRAWN AEROBIC AND ANAEROBIC Blood Culture results may not be optimal due to an inadequate volume of blood received in culture bottles   Culture   Final    NO GROWTH < 12 HOURS Performed at [facility], [place] [address], [place], [place] [address]    Report Status PENDING  Incomplete  MRSA Next Gen by PCR, Nasal     Status: None   Collection Time: 03/22/21 11:15 PM   Specimen: Nasal Mucosa; Nasal Swab  Result Value Ref Range Status   MRSA by PCR Next Gen NOT DETECTED NOT DETECTED Final    Comment: (NOTE) The GeneXpert MRSA Assay (FDA approved for NASAL specimens only), is one component of a comprehensive MRSA colonization surveillance program. It is not intended to diagnose MRSA infection nor to guide or monitor treatment for MRSA infections. Test performance is not FDA approved in patients less than 45 years old. Performed at [facility], [place] [address]  [place],  [address]       Studies: CT CHEST HIGH RESOLUTION  Result Date: 03/23/2021 CLINICAL DATA:  65 year old male with history of increasing shortness of breath with exertion for the past 5-7 days. EXAM: CT CHEST WITHOUT CONTRAST TECHNIQUE: Multidetector CT imaging of the chest was performed following the standard protocol without intravenous contrast. High resolution imaging of the lungs, as well as inspiratory and expiratory imaging, was performed. COMPARISON:  Chest CT 09/09/2020. FINDINGS: Cardiovascular: Heart size is normal. There is no significant pericardial fluid, thickening or pericardial calcification. There is aortic atherosclerosis, as well as atherosclerosis of the great vessels of the mediastinum and the coronary arteries, including calcified atherosclerotic plaque in the left main, left anterior descending, left circumflex and right coronary arteries. Status post median sternotomy for CABG including LIMA to the LAD. Calcifications of the aortic valve and inferior mitral annulus. Left-sided pacemaker/AICD with lead tip terminating in the right ventricular apex. Mediastinum/Nodes: No pathologically enlarged mediastinal or hilar lymph nodes. Please note that accurate exclusion of hilar adenopathy is limited on noncontrast CT scans. Esophagus is unremarkable in appearance. No axillary lymphadenopathy. Lungs/Pleura: High-resolution images demonstrate marked progression of widespread areas of ground-glass attenuation, septal thickening, subpleural reticulation, parenchymal banding, traction bronchiectasis, peripheral bronchiolectasis, and areas of developing honeycombing. These findings have a definitive craniocaudal gradient. Inspiratory and expiratory imaging is unremarkable. No acute consolidative airspace disease. No pleural effusions. In the posterior aspect of the right upper lobe (axial image 30 of series 3) there is a new 1.5 x 0.9 cm pleural based nodular density. No other definite  suspicious appearing pulmonary nodules or masses are confidently identified upon this background of advanced interstitial lung disease. Upper Abdomen: Aortic atherosclerosis. Calcified gallstones lying dependently in the gallbladder. Musculoskeletal: Median sternotomy wires. Electronic device in the subcutaneous fat of the medial lower left hemithorax, presumably an implantable loop recorder. There are no aggressive appearing lytic or blastic lesions noted in the visualized portions  of the skeleton. IMPRESSION: 1. 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 2. Interval development of a pleural-based nodular density in the posterior aspect of the right upper lobe. This may simply represent an area of developing fibrosis, however, the possibility of neoplasm is not entirely excluded. Further evaluation with nonemergent PET-CT should be considered if clinically appropriate. 3. Aortic atherosclerosis, in addition to left main and 3 vessel coronary artery disease. Status post median sternotomy for CABG including LIMA to the LAD. 4. There are calcifications of the aortic valve and mitral annulus. Echocardiographic correlation for evaluation of potential valvular dysfunction may be warranted if clinically indicated. 5. Cholelithiasis. Aortic Atherosclerosis (ICD10-I70.0). Electronically Signed   By: Vinnie Langton M.D.   On: 03/23/2021 09:16   DG Chest Port 1 View  Result Date: 03/22/2021 CLINICAL DATA:  Worsening shortness of breath over the past week EXAM: PORTABLE CHEST 1 VIEW COMPARISON:  01/26/2021, chest CT from earlier in the same day. FINDINGS: Cardiac shadow remains enlarged. Postsurgical changes are noted. Defibrillator and loop recorder are seen and stable. Diffuse chronic fibrotic changes are identified bilaterally although some suggestion of  alveolar density is noted bilaterally likely representing some superimposed edema. No sizable effusion is noted. No bony abnormality is seen. IMPRESSION: Chronic fibrotic changes with mild superimposed edema. Electronically Signed   By: Inez Catalina M.D.   On: 03/22/2021 19:41      Flora Lipps, MD  [facility] 03/24/2021  If 7PM-7AM, please contact night-coverage

## 2021-03-24 NOTE — Progress Notes (Signed)
ANTICOAGULATION CONSULT NOTE  Pharmacy Consult for Heparin Indication: chest pain/ACS  No Known Allergies  Patient Measurements: Height: 5\' 5"  (165.1 cm) Weight: 75.7 kg (166 lb 14.2 oz) IBW/kg (Calculated) : 61.5  Vital Signs: Temp: 97.3 F (36.3 C) (08/27 0413) Temp Source: Oral (08/27 0413) BP: 101/73 (08/27 0413) Pulse Rate: 55 (08/27 0413)  Labs: Recent Labs    03/22/21 1905 03/22/21 1926 03/22/21 2130 03/22/21 2210 03/23/21 0131 03/23/21 0434 03/23/21 1518 03/24/21 0050  HGB 12.4*  --   --   --  11.5*  --   --  10.1*  HCT 38.6*  --   --   --  34.9*  --   --  31.7*  PLT 356  --   --   --  328  --   --  348  LABPROT  --    < >  --  27.4* 27.0*  --   --  21.2*  INR  --    < >  --  2.5* 2.5*  --   --  1.8*  CREATININE 2.83*  --   --   --  2.64*  --  2.54* 2.33*  TROPONINIHS 1,268*  --  1,218*  --   --  1,247*  --   --    < > = values in this interval not displayed.     Estimated Creatinine Clearance: 30 mL/min (A) (by C-G formula based on SCr of 2.33 mg/dL (H)).   Medical History: Past Medical History:  Diagnosis Date   Abnormal nuclear stress test 02/19/2018   Angina, class III ([place]) 02/19/2018   Anxiety    Chronic combined systolic and diastolic CHF (congestive heart failure) (HCC)    Chronic kidney disease, stage 3b (HCC)    Coronary artery disease    DDD (degenerative disc disease), cervical 03/01/2013   Diabetes mellitus    type 2   Diverticular disease 01/12/2014   mild   DVT (deep venous thrombosis) (HCC)    Dyslipidemia    Hypertension    ICD (implantable cardioverter-defibrillator) in place    ILD (interstitial lung disease) ([place])    Lumbosacral radiculopathy at L4 02/18/2017   Myocardial infarction ([place]) 2012   Obesity    Snoring 10/12/2013   TIA (transient ischemic attack) 08/09/2014   Tubular adenoma of colon 01/12/2014   transverse colon   VF (ventricular fibrillation) ([place])    arrest   Vitamin D deficiency 02/18/2017     Medications:  Medications Prior to Admission  Medication Sig Dispense Refill Last Dose   clopidogrel (PLAVIX) 75 MG tablet Take 75 mg by mouth daily.   03/22/2021   furosemide (LASIX) 40 MG tablet Take 40 mg by mouth daily.   03/22/2021   guaiFENesin-dextromethorphan (ROBITUSSIN DM) 100-10 MG/5ML syrup Take 10 mLs by mouth every 6 (six) hours as needed for cough. 118 mL 1 Past Week   insulin glargine (LANTUS) 100 UNIT/ML Solostar Pen Inject 60 Units into the skin daily before breakfast.   03/22/2021   metoprolol succinate (TOPROL-XL) 50 MG 24 hr tablet Take 1 tablet (50 mg total) by mouth in the morning and at bedtime. Take with or immediately following a meal. 180 tablet 1 03/22/2021 at 0800   mycophenolate (CELLCEPT) 500 MG tablet Take 1,000 mg by mouth 2 (two) times daily.   03/22/2021   nitroGLYCERIN (NITROSTAT) 0.4 MG SL tablet PLACE 1 TABLET UNDER THE TONGUE EVERY 5 MINUTES AS NEEDED FOR CHEST PAIN 25 tablet 0 unknown  NOVOLOG FLEXPEN 100 UNIT/ML FlexPen Inject 0-10 Units into the skin 3 (three) times daily as needed for high blood sugar. Substitute to any brand approved.Before each meal 3 times a day, 140-199 - 2 units, 200-250 - 4 units, 251-299 - 6 units,  300-349 - 8 units,  350 or above 10 units. Dispense syringes and needles as needed, Ok to switch to PEN if approved. DX DM2, Code E11.65   03/21/2021   OZEMPIC, 1 MG/DOSE, 4 MG/3ML SOPN Inject 1 mg into the skin once a week.   Past Month   pantoprazole (PROTONIX) 40 MG tablet Take 40 mg by mouth daily.   03/22/2021   predniSONE (DELTASONE) 10 MG tablet 2 tabs daily for 2 weeks then 1 1/2 tab daily for 2 weeks then 1 tab daily 36 tablet 1 03/22/2021   ranolazine (RANEXA) 500 MG 12 hr tablet Take 1 tablet (500 mg total) by mouth 2 (two) times daily. TAKE ONE TABLET BY MOUTH TWICE DAILY.PLEASE MAKE APPT WITH DR.HARDING FOR REFILLS. 60 tablet 0 03/22/2021   rosuvastatin (CRESTOR) 40 MG tablet Take 1 tablet (40 mg total) by mouth daily. 90 tablet  3 03/22/2021   vitamin B-12 (CYANOCOBALAMIN) 1000 MCG tablet TAKE 1 TABLET (1,000 MCG TOTAL) BY MOUTH DAILY. 30 tablet 0 03/22/2021   warfarin (COUMADIN) 5 MG tablet Take 0.5-1 tablets (2.5-5 mg total) by mouth See admin instructions. Take 1 tablet (5 mg) daily except on Sun Take 1/2 tablet (2.5 mg)   03/21/2021 at 2030   benzonatate (TESSALON) 200 MG capsule Take 1 capsule (200 mg total) by mouth 3 (three) times daily as needed for cough. 45 capsule 1    insulin aspart (NOVOLOG) 100 UNIT/ML injection Substitute to any brand approved.Before each meal 3 times a day, 140-199 - 2 units, 200-250 - 4 units, 251-299 - 6 units,  300-349 - 8 units,  350 or above 10 units. Dispense syringes and needles as needed, Ok to switch to PEN if approved. DX DM2, Code E11.65 (Patient not taking: No sig reported) 10 mL 0 Not Taking   Insulin Syringe-Needle U-100 25G X 1" 1 ML MISC For 4 times a day insulin SQ, 1 month supply. Diagnosis E11.65 30 each 0    OXYGEN Inhale into the lungs. At 9 liters      predniSONE (DELTASONE) 5 MG tablet Take 1 tablet (5 mg total) by mouth daily with breakfast. (Patient not taking: No sig reported)   Not Taking   sulfamethoxazole-trimethoprim (BACTRIM DS) 800-160 MG tablet Take 1 tablet by mouth 3 (three) times a week. 12 tablet 1    Scheduled:  Infusions:   Assessment: 32 yom with a history of CAD, h/o CABG and PCI, h/o VF arrest s/p ICD implanted 2012, chronic combined HF. Patient presented to ED with SOB. Heparin infusion per pharmacy ordered for ACS. Plans noted for Echo  Patient is on warfarin prior to arrival. Last taken 8/24 @ 2030. Home dose 5mg  [id number]; 2.5mg  on Su.  As outpatient, plans were to start steroids with mycophenolate so bactrim was added for PCP prophylaxis. He has not started Bactrim yet.  INR today came back at 1.8. Hgb 10.1, plt 348. No s/sx of bleeding.     Goal of Therapy:  Heparin level 0.3-0.7 units/ml Monitor platelets by anticoagulation protocol: Yes    Plan:  -Holding warfarin  -Start heparin infusion at 900 units/hr  -Order heparin level in 6-8 hours -Monitor CBC, HL, and for s/sx of bleeding   Antonietta Jewel,  PharmD, [facility] Clinical Pharmacist  Phone: [phone] 03/24/2021 7:13 AM  Please check AMION for all [facility] phone numbers After 10:00 PM, call [facility] [phone]

## 2021-03-24 NOTE — Progress Notes (Addendum)
ANTICOAGULATION CONSULT NOTE  Pharmacy Consult for Heparin + warfarin Indication: chest pain/ACS  No Known Allergies  Patient Measurements: Height: 5\' 5"  (165.1 cm) Weight: 75.7 kg (166 lb 14.2 oz) IBW/kg (Calculated) : 61.5  Vital Signs: Temp: 97.3 F (36.3 C) (08/27 0413) Temp Source: Oral (08/27 0413) BP: 101/64 (08/27 0931) Pulse Rate: 62 (08/27 0931)  Labs: Recent Labs    03/22/21 1905 03/22/21 1926 03/22/21 2130 03/22/21 2210 03/23/21 0131 03/23/21 0434 03/23/21 1518 03/24/21 0050  HGB 12.4*  --   --   --  11.5*  --   --  10.1*  HCT 38.6*  --   --   --  34.9*  --   --  31.7*  PLT 356  --   --   --  328  --   --  348  LABPROT  --    < >  --  27.4* 27.0*  --   --  21.2*  INR  --    < >  --  2.5* 2.5*  --   --  1.8*  CREATININE 2.83*  --   --   --  2.64*  --  2.54* 2.33*  TROPONINIHS 1,268*  --  1,218*  --   --  1,247*  --   --    < > = values in this interval not displayed.     Estimated Creatinine Clearance: 30 mL/min (A) (by C-G formula based on SCr of 2.33 mg/dL (H)).   Medical History: Past Medical History:  Diagnosis Date   Abnormal nuclear stress test 02/19/2018   Angina, class III ([place]) 02/19/2018   Anxiety    Chronic combined systolic and diastolic CHF (congestive heart failure) (HCC)    Chronic kidney disease, stage 3b (HCC)    Coronary artery disease    DDD (degenerative disc disease), cervical 03/01/2013   Diabetes mellitus    type 2   Diverticular disease 01/12/2014   mild   DVT (deep venous thrombosis) (HCC)    Dyslipidemia    Hypertension    ICD (implantable cardioverter-defibrillator) in place    ILD (interstitial lung disease) ([place])    Lumbosacral radiculopathy at L4 02/18/2017   Myocardial infarction ([place]) 2012   Obesity    Snoring 10/12/2013   TIA (transient ischemic attack) 08/09/2014   Tubular adenoma of colon 01/12/2014   transverse colon   VF (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   03/22/2021   furosemide (LASIX) 40 MG tablet Take 40 mg by mouth daily.   03/22/2021   guaiFENesin-dextromethorphan (ROBITUSSIN DM) 100-10 MG/5ML syrup Take 10 mLs by mouth every 6 (six) hours as needed for cough. 118 mL 1 Past Week   insulin glargine (LANTUS) 100 UNIT/ML Solostar Pen Inject 60 Units into the skin daily before breakfast.   03/22/2021   metoprolol succinate (TOPROL-XL) 50 MG 24 hr tablet Take 1 tablet (50 mg total) by mouth in the morning and at bedtime. Take with or immediately following a meal. 180 tablet 1 03/22/2021 at 0800   mycophenolate (CELLCEPT) 500 MG tablet Take 1,000 mg by mouth 2 (two) times daily.   03/22/2021   nitroGLYCERIN (NITROSTAT) 0.4 MG SL tablet PLACE 1 TABLET UNDER THE TONGUE EVERY 5 MINUTES AS NEEDED FOR CHEST PAIN 25 tablet 0 unknown  NOVOLOG FLEXPEN 100 UNIT/ML FlexPen Inject 0-10 Units into the skin 3 (three) times daily as needed for high blood sugar. Substitute to any brand approved.Before each meal 3 times a day, 140-199 - 2 units, 200-250 - 4 units, 251-299 - 6 units,  300-349 - 8 units,  350 or above 10 units. Dispense syringes and needles as needed, Ok to switch to PEN if approved. DX DM2, Code E11.65   03/21/2021   OZEMPIC, 1 MG/DOSE, 4 MG/3ML SOPN Inject 1 mg into the skin once a week.   Past Month   pantoprazole (PROTONIX) 40 MG tablet Take 40 mg by mouth daily.   03/22/2021   predniSONE (DELTASONE) 10 MG tablet 2 tabs daily for 2 weeks then 1 1/2 tab daily for 2 weeks then 1 tab daily 36 tablet 1 03/22/2021   ranolazine (RANEXA) 500 MG 12 hr tablet Take 1 tablet (500 mg total) by mouth 2 (two) times daily. TAKE ONE TABLET BY MOUTH TWICE DAILY.PLEASE MAKE APPT WITH DR.HARDING FOR REFILLS. 60 tablet 0 03/22/2021   rosuvastatin (CRESTOR) 40 MG tablet Take 1 tablet (40 mg total) by mouth daily. 90 tablet  3 03/22/2021   vitamin B-12 (CYANOCOBALAMIN) 1000 MCG tablet TAKE 1 TABLET (1,000 MCG TOTAL) BY MOUTH DAILY. 30 tablet 0 03/22/2021   warfarin (COUMADIN) 5 MG tablet Take 0.5-1 tablets (2.5-5 mg total) by mouth See admin instructions. Take 1 tablet (5 mg) daily except on Sun Take 1/2 tablet (2.5 mg)   03/21/2021 at 2030   benzonatate (TESSALON) 200 MG capsule Take 1 capsule (200 mg total) by mouth 3 (three) times daily as needed for cough. 45 capsule 1    insulin aspart (NOVOLOG) 100 UNIT/ML injection Substitute to any brand approved.Before each meal 3 times a day, 140-199 - 2 units, 200-250 - 4 units, 251-299 - 6 units,  300-349 - 8 units,  350 or above 10 units. Dispense syringes and needles as needed, Ok to switch to PEN if approved. DX DM2, Code E11.65 (Patient not taking: No sig reported) 10 mL 0 Not Taking   Insulin Syringe-Needle U-100 25G X 1" 1 ML MISC For 4 times a day insulin SQ, 1 month supply. Diagnosis E11.65 30 each 0    OXYGEN Inhale into the lungs. At 9 liters      predniSONE (DELTASONE) 5 MG tablet Take 1 tablet (5 mg total) by mouth daily with breakfast. (Patient not taking: No sig reported)   Not Taking   sulfamethoxazole-trimethoprim (BACTRIM DS) 800-160 MG tablet Take 1 tablet by mouth 3 (three) times a week. 12 tablet 1    Scheduled:  Infusions:   Assessment: 65 yom with a history of CAD, h/o CABG and PCI, h/o VF arrest s/p ICD implanted 2012, chronic combined HF. Patient presented to ED with SOB. Heparin infusion per pharmacy ordered for ACS. Plans noted for Echo  Patient is on warfarin prior to arrival. Last taken 8/24 @ 2030. Home dose 5mg  MTWThFSa; 2.5mg  on Su.  As outpatient, plans were to start steroids with mycophenolate so bactrim was added for PCP prophylaxis. He has not started Bactrim yet.  INR 1.8 today, no new WMAs on ECHO so pharmacy asked to resume warfarin. Will leave heparin gtt on tonight given subtherapeutic INR and noted hx DVT when INR subtherapeutic, can  likely stop tomorrow. Initial heparin level is subtherapeutic at 0.16.    Goal of Therapy:  Heparin level 0.3-0.7 units/ml Monitor platelets by anticoagulation protocol: Yes   Plan:  -Warfarin 5mg   x1 tonight -Increase heparin to 1050 units/h -Repeat heparin level with am labs  Arrie Senate, PharmD, [place], [facility] Clinical Pharmacist [phone] Please check AMION for all [place] numbers 03/24/2021

## 2021-03-24 NOTE — Progress Notes (Signed)
Progress Note  Patient Name: Eugene [name] Date of Encounter: 03/24/2021  Primary Cardiologist: Werner Lean, MD   Subjective   No change since prior.  Echo had been planned but not performed; will reorder.  Patient notes No, CP or palpitations.  SOB has no change from yesterday.  Inpatient Medications    Scheduled Meds:  clopidogrel  75 mg Oral Daily   insulin aspart  0-15 Units Subcutaneous TID AC & HS   insulin glargine-yfgn  60 Units Subcutaneous Daily   methylPREDNISolone (SOLU-MEDROL) injection  120 mg Intravenous Daily   metoprolol succinate  50 mg Oral BID   mycophenolate  1,000 mg Oral BID   pantoprazole  40 mg Oral Daily   potassium chloride  40 mEq Oral Daily   ranolazine  500 mg Oral BID   rosuvastatin  40 mg Oral Daily   sodium chloride flush  3 mL Intravenous Q12H   Continuous Infusions:  sodium chloride     heparin 900 Units/hr (03/24/21 0949)   PRN Meds: sodium chloride, acetaminophen, guaiFENesin-dextromethorphan, ipratropium-albuterol, nitroGLYCERIN, ondansetron (ZOFRAN) IV, polyethylene glycol, sodium chloride, sodium chloride flush   Vital Signs    Vitals:   03/23/21 1951 03/24/21 0007 03/24/21 0413 03/24/21 0931  BP: 116/71 99/66 101/73 101/64  Pulse: 80 62 (!) 55 62  Resp: 20 20 19    Temp: (!) 97.5 F (36.4 C) 97.7 F (36.5 C) (!) 97.3 F (36.3 C)   TempSrc: Oral Oral Oral   SpO2: (!) 87% 98% 96%   Weight:   75.7 kg   Height:        Intake/Output Summary (Last 24 hours) at 03/24/2021 1006 Last data filed at 03/24/2021 0956 Gross per 24 hour  Intake 3161.4 ml  Output 2325 ml  Net 836.4 ml   Filed Weights   03/22/21 1856 03/22/21 2319 03/24/21 0413  Weight: 75.8 kg 73.2 kg 75.7 kg    Telemetry    SR with significant baseline artifact - Personally Reviewed  ECG    No new - Personally Reviewed  Physical Exam   GEN: No acute distress.   Neck: No JVD Cardiac: RRR, no murmurs, rubs, or gallops.  Respiratory: Coarse  breath sounds bilaterally with minimally air movement GI: Soft, nontender, non-distended  MS: No edema; No deformity. Neuro:  Nonfocal  Psych: Normal affect   Labs    Chemistry Recent Labs  Lab 03/22/21 1244 03/22/21 1244 03/22/21 1905 03/23/21 0131 03/23/21 1518 03/24/21 0050  NA 133*  --  134* 137 136 137  K 3.1*  --  2.8* 2.6* 3.7 3.5  CL 92*  --  94* 100 100 100  CO2 26  --  27 27 26 27   GLUCOSE 413*  --  425* 179* 260* 70  BUN 58*  --  56* 48* 44* 45*  CREATININE 2.80*  --  2.83* 2.64* 2.54* 2.33*  CALCIUM 9.1  --  9.4 9.1 8.7* 9.0  PROT 6.4  --  6.3* 5.8*  --   --   ALBUMIN 3.5  --  3.3* 3.0*  --   --   AST 28  --  32 25  --   --   ALT 53  --  61* 55*  --   --   ALKPHOS 79  --  76 72  --   --   BILITOT 0.6  --  0.9 0.6  --   --   GFRNONAA  --    < > 24* 26*  37* 30*  ANIONGAP  --    < > 13 10 10 10    < > = values in this interval not displayed.     Hematology Recent Labs  Lab 03/22/21 1905 03/23/21 0131 03/24/21 0050  WBC 13.4* 11.6* 15.1*  RBC 4.36 4.04* 3.57*  HGB 12.4* 11.5* 10.1*  HCT 38.6* 34.9* 31.7*  MCV 88.5 86.4 88.8  MCH 28.4 28.5 28.3  MCHC 32.1 33.0 31.9  RDW 15.4 15.0 15.4  PLT 356 328 348    Cardiac EnzymesNo results for input(s): TROPONINI in the last 168 hours. No results for input(s): TROPIPOC in the last 168 hours.   BNP Recent Labs  Lab 03/22/21 1244 03/22/21 1905  BNP  --  715.9*  PROBNP 821.0*  --      DDimer No results for input(s): DDIMER in the last 168 hours.   Radiology    CT CHEST HIGH RESOLUTION  Result Date: 03/23/2021 CLINICAL DATA:  65 year old male with history of increasing shortness of breath with exertion for the past 5-7 days. EXAM: CT CHEST WITHOUT CONTRAST TECHNIQUE: Multidetector CT imaging of the chest was performed following the standard protocol without intravenous contrast. High resolution imaging of the lungs, as well as inspiratory and expiratory imaging, was performed. COMPARISON:  Chest CT  09/09/2020. FINDINGS: Cardiovascular: Heart size is normal. There is no significant pericardial fluid, thickening or pericardial calcification. There is aortic atherosclerosis, as well as atherosclerosis of the great vessels of the mediastinum and the coronary arteries, including calcified atherosclerotic plaque in the left main, left anterior descending, left circumflex and right coronary arteries. Status post median sternotomy for CABG including LIMA to the LAD. Calcifications of the aortic valve and inferior mitral annulus. Left-sided pacemaker/AICD with lead tip terminating in the right ventricular apex. Mediastinum/Nodes: No pathologically enlarged mediastinal or hilar lymph nodes. Please note that accurate exclusion of hilar adenopathy is limited on noncontrast CT scans. Esophagus is unremarkable in appearance. No axillary lymphadenopathy. Lungs/Pleura: High-resolution images demonstrate marked progression of widespread areas of ground-glass attenuation, septal thickening, subpleural reticulation, parenchymal banding, traction bronchiectasis, peripheral bronchiolectasis, and areas of developing honeycombing. These findings have a definitive craniocaudal gradient. Inspiratory and expiratory imaging is unremarkable. No acute consolidative airspace disease. No pleural effusions. In the posterior aspect of the right upper lobe (axial image 30 of series 3) there is a new 1.5 x 0.9 cm pleural based nodular density. No other definite suspicious appearing pulmonary nodules or masses are confidently identified upon this background of advanced interstitial lung disease. Upper Abdomen: Aortic atherosclerosis. Calcified gallstones lying dependently in the gallbladder. Musculoskeletal: Median sternotomy wires. Electronic device in the subcutaneous fat of the medial lower left hemithorax, presumably an implantable loop recorder. There are no aggressive appearing lytic or blastic lesions noted in the visualized portions of  the skeleton. IMPRESSION: 1. 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 2. Interval development of a pleural-based nodular density in the posterior aspect of the right upper lobe. This may simply represent an area of developing fibrosis, however, the possibility of neoplasm is not entirely excluded. Further evaluation with nonemergent PET-CT should be considered if clinically appropriate. 3. Aortic atherosclerosis, in addition to left main and 3 vessel coronary artery disease. Status post median sternotomy for CABG including LIMA to the LAD. 4. There are calcifications of the aortic valve and mitral annulus. Echocardiographic correlation for evaluation  of potential valvular dysfunction may be warranted if clinically indicated. 5. Cholelithiasis. Aortic Atherosclerosis (ICD10-I70.0). Electronically Signed   By: Vinnie Langton M.D.   On: 03/23/2021 09:16   DG Chest Port 1 View  Result Date: 03/22/2021 CLINICAL DATA:  Worsening shortness of breath over the past week EXAM: PORTABLE CHEST 1 VIEW COMPARISON:  01/26/2021, chest CT from earlier in the same day. FINDINGS: Cardiac shadow remains enlarged. Postsurgical changes are noted. Defibrillator and loop recorder are seen and stable. Diffuse chronic fibrotic changes are identified bilaterally although some suggestion of alveolar density is noted bilaterally likely representing some superimposed edema. No sizable effusion is noted. No bony abnormality is seen. IMPRESSION: Chronic fibrotic changes with mild superimposed edema. Electronically Signed   By: Inez Catalina M.D.   On: 03/22/2021 19:41    Cardiac Studies   09-12-20 LHC Prox LAD lesion is 99% stenosed. Ost LAD to Prox LAD lesion is 100% stenosed. Mid LAD lesion is 80% stenosed. 1st Mrg lesion is 99% stenosed. Ost Cx to Prox Cx lesion is 80%  stenosed. Mid Cx lesion is 100% stenosed. Balloon angioplasty was performed. Prox RCA to Dist RCA lesion is 100% stenosed with 100% stenosed side branch in RPAV. Ost Ramus lesion is 100% stenosed. SVG and is normal in caliber. Previously placed Prox Graft drug eluting stent is widely patent. LIMA and is large. The graft exhibits no disease. SVG and is normal in caliber. Origin to Prox Graft lesion is 100% stenosed. SVG and is large. Prox Graft to Dist Graft lesion is 100% stenosed. Dist Cx lesion is 95% stenosed. Mid LM to Dist LM lesion is 80% stenosed. Post intervention, there is a 20% residual stenosis.   Severe native coronary obstructive disease 80% distal left main stenosis, total occlusion of the LAD, ramus immediate vessel, high-grade circumflex stenoses, total occlusion of the proximal RCA.  There was a shunt to the distal RCA via left to right and right to right collateralization.   Patent LIMA graft supplying the mid LAD with diffuse disease in the LAD after insertion.   Supplying the OM 2 vessel with widely patent stent in the proximal portion of the SVG.  There is 95% in-stent restenosis in the native OM 2 vessel beyond the anastomosis.   Old occlusion of the vein graft which had supplied the ramus vessel.   Old occlusion of the vein graft which had supplied the PDA.   Difficult but ultimately successful intervention through the SVG to the distal OM 2 vessel requiring PTCA, [name] Cutting Balloon, and ultimate noncompliant balloon dilatation with a 95% stenosis being reduced to less than 20%.   LVEDP: 11 mmHg   RECOMMENDATION: DAPT probably indefinitely.  Since the patient developed early in-stent restenosis on aspirin/Plavix, Brilinta was utilized for more aggressive antiplatelet therapy.  Medical therapy for concomitant CAD.  Optimal blood pressure control, diabetes management, and aggressive lipid therapy with target LDL less than 70.   01-27-21 TTE 1. Left  ventricular ejection fraction, by estimation, is 45 to 50%. The  left ventricle has mildly decreased function. The left ventricle  demonstrates regional wall motion abnormalities.      There is hypokinesis of the basal-to-mid anteroseptal, basal-to-mid  anterior, basal-to-mid inferior and basal inferoseptal LV walls. Left  ventricular diastolic parameters are consistent with Grade I diastolic  dysfunction (impaired relaxation).   2. Right ventricular systolic function is mildly reduced. The right  ventricular size is not well visualized.   3. The mitral valve is grossly normal.  Trivial mitral valve  regurgitation. No evidence of mitral stenosis.   4. The aortic valve is tricuspid. There is mild calcification of the  aortic valve. There is moderate thickening of the aortic valve. Aortic  valve regurgitation is not visualized. Mild to moderate aortic valve  sclerosis/calcification is present, without  any evidence of aortic stenosis.   Patient Profile     64 y.o. 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  Assessment & Plan    NSTEMI CAD, HLD w/ LDL < 70 goal - asymptomatic and likely form demand - on heparin; based on Echo (limited, reordered) may transition back to coumadin -  prior myopathy not through to be related to statin, on rosuvastatin; cannot afford PSCK9i he was previously on - had anxiety attacks with Brilinta - if need more aggressive medical strategy, may need Effient (only if new WMA) - continue ranolazine 500 mg PO BID  PAF CHADSVASC 7 - on AC as above - continue metoprolol succinate 50 mg (BID dose is atypical but he has tolerated it well)  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      Signed, Werner Lean, MD  03/24/2021, 10:06 AM

## 2021-03-24 NOTE — Progress Notes (Signed)
  Echocardiogram 2D Echocardiogram has been performed.  Eugene [name] 03/24/2021, 2:57 PM

## 2021-03-25 DIAGNOSIS — J84112 Idiopathic pulmonary fibrosis: Secondary | ICD-10-CM | POA: Diagnosis not present

## 2021-03-25 LAB — COMPREHENSIVE METABOLIC PANEL
ALT: UNDETERMINED U/L (ref 0–44)
AST: 37 U/L (ref 15–41)
Albumin: 2.7 g/dL — ABNORMAL LOW (ref 3.5–5.0)
Alkaline Phosphatase: 56 U/L (ref 38–126)
Anion gap: 12 (ref 5–15)
BUN: 42 mg/dL — ABNORMAL HIGH (ref 8–23)
CO2: 18 mmol/L — ABNORMAL LOW (ref 22–32)
Calcium: 8.8 mg/dL — ABNORMAL LOW (ref 8.9–10.3)
Chloride: 104 mmol/L (ref 98–111)
Creatinine, Ser: 2.23 mg/dL — ABNORMAL HIGH (ref 0.61–1.24)
GFR, Estimated: 32 mL/min — ABNORMAL LOW (ref >60)
Glucose, Bld: 117 mg/dL — ABNORMAL HIGH (ref 70–99)
Potassium: 4.4 mmol/L (ref 3.5–5.1)
Sodium: 134 mmol/L — ABNORMAL LOW (ref 135–145)
Total Bilirubin: UNDETERMINED mg/dL (ref 0.3–1.2)
Total Protein: 5.3 g/dL — ABNORMAL LOW (ref 6.5–8.1)

## 2021-03-25 LAB — GLUCOSE, CAPILLARY
Glucose-Capillary: 62 mg/dL — ABNORMAL LOW (ref 70–99)
Glucose-Capillary: 88 mg/dL (ref 70–99)
Glucose-Capillary: 63 mg/dL — ABNORMAL LOW (ref 70–99)
Glucose-Capillary: 95 mg/dL (ref 70–99)
Glucose-Capillary: 124 mg/dL — ABNORMAL HIGH (ref 70–99)
Glucose-Capillary: 237 mg/dL — ABNORMAL HIGH (ref 70–99)
Glucose-Capillary: 170 mg/dL — ABNORMAL HIGH (ref 70–99)

## 2021-03-25 LAB — ALKALINE PHOSPHATASE: Alkaline Phosphatase: 56 U/L (ref 38–126)

## 2021-03-25 LAB — PROTIME-INR
INR: 1.4 — ABNORMAL HIGH (ref 0.8–1.2)
Prothrombin Time: 17.4 seconds — ABNORMAL HIGH (ref 11.4–15.2)

## 2021-03-25 LAB — CBC
HCT: 33.6 % — ABNORMAL LOW (ref 39.0–52.0)
Hemoglobin: 11.3 g/dL — ABNORMAL LOW (ref 13.0–17.0)
MCH: 29.2 pg (ref 26.0–34.0)
MCHC: 33.6 g/dL (ref 30.0–36.0)
MCV: 86.8 fL (ref 80.0–100.0)
Platelets: 273 10*3/uL (ref 150–400)
RBC: 3.87 MIL/uL — ABNORMAL LOW (ref 4.22–5.81)
RDW: 15.8 % — ABNORMAL HIGH (ref 11.5–15.5)
WBC: 14.9 10*3/uL — ABNORMAL HIGH (ref 4.0–10.5)
nRBC: 0 % (ref 0.0–0.2)

## 2021-03-25 LAB — PHOSPHORUS: Phosphorus: 2.2 mg/dL — ABNORMAL LOW (ref 2.5–4.6)

## 2021-03-25 LAB — BILIRUBIN, TOTAL: Total Bilirubin: 0.9 mg/dL (ref 0.3–1.2)

## 2021-03-25 LAB — MAGNESIUM: Magnesium: 1.8 mg/dL (ref 1.7–2.4)

## 2021-03-25 LAB — HEPARIN LEVEL (UNFRACTIONATED)
Heparin Unfractionated: 0.32 IU/mL (ref 0.30–0.70)
Heparin Unfractionated: 0.37 IU/mL (ref 0.30–0.70)

## 2021-03-25 MED ORDER — POLYETHYLENE GLYCOL 3350 17 G PO PACK
17.0000 g | PACK | Freq: Two times a day (BID) | ORAL | Status: DC
  Filled 2021-03-25 (×2): qty 1

## 2021-03-25 MED ORDER — INSULIN ASPART 100 UNIT/ML IJ SOLN
0.0000 [IU] | Freq: Three times a day (TID) | INTRAMUSCULAR | Status: DC
  Administered 2021-03-25: 5 [IU] via SUBCUTANEOUS

## 2021-03-25 MED ORDER — WARFARIN SODIUM 7.5 MG PO TABS
7.5000 mg | ORAL_TABLET | Freq: Once | ORAL | Status: AC
  Administered 2021-03-25: 7.5 mg via ORAL
  Filled 2021-03-25: qty 1

## 2021-03-25 NOTE — Progress Notes (Addendum)
ANTICOAGULATION CONSULT NOTE  Pharmacy Consult for Heparin + warfarin Indication: chest pain/ACS  No Known Allergies  Patient Measurements: Height: 5\' 5"  (165.1 cm) Weight: 81.5 kg (179 lb 10.8 oz) IBW/kg (Calculated) : 61.5  Vital Signs: Temp: 97.5 F (36.4 C) (08/28 0756) Temp Source: Oral (08/28 0756) BP: 94/64 (08/28 0756) Pulse Rate: 60 (08/28 0756)  Labs: Recent Labs    03/22/21 1905 03/22/21 1926 03/22/21 2130 03/22/21 2210 03/23/21 0131 03/23/21 0434 03/23/21 1518 03/24/21 0050 03/24/21 1457 03/25/21 0043 03/25/21 0449  HGB 12.4*  --   --   --  11.5*  --   --  10.1*  --  11.3*  --   HCT 38.6*  --   --   --  34.9*  --   --  31.7*  --  33.6*  --   PLT 356  --   --   --  328  --   --  348  --  273  --   LABPROT  --    < >  --    < > 27.0*  --   --  21.2*  --   --  17.4*  INR  --    < >  --    < > 2.5*  --   --  1.8*  --   --  1.4*  HEPARINUNFRC  --   --   --   --   --   --   --   --  0.16*  --  0.32  CREATININE 2.83*  --   --   --  2.64*  --  2.54* 2.33*  --  2.23*  --   TROPONINIHS 1,268*  --  1,218*  --   --  1,247*  --   --   --   --   --    < > = values in this interval not displayed.     Estimated Creatinine Clearance: 32.5 mL/min (A) (by C-G formula based on SCr of 2.23 mg/dL (H)).  Medications:  sodium chloride     heparin 1,050 Units/hr (03/24/21 1737)    Assessment: 18 yom with a history of CAD, h/o CABG and PCI, h/o VF arrest s/p ICD implanted 2012, chronic combined HF. Patient presented to ED with SOB. Heparin infusion per pharmacy ordered for ACS. Plans noted for Echo  Patient is on warfarin prior to arrival. Last taken 8/24 @ 2030. Home dose 5mg  MTWThFSa; 2.5mg  on Su.  As outpatient, plans were to start steroids with mycophenolate so bactrim was added for PCP prophylaxis. He has not started Bactrim yet.  INR down to 1.4 today after holding Coumadin.  Resumed last PM.  Heparin level within goal range on 1050 units/hr x 2 today.  Goal of  Therapy:  Heparin level 0.3-0.7 units/ml Monitor platelets by anticoagulation protocol: Yes   Plan:  -Warfarin 7.5 mg x1 tonight -Continue IV heparin at 1050 units/h -Confirm heparin level this morning.  -At discharge, consider changing Septra to Dapsone for PCP prophylaxis to avoid drug interaction with Coumadin.  [place] Crane, Roylene Reason, [facility] Clinical Pharmacist  03/25/2021 8:43 AM   [facility] pharmacy phone numbers are listed on [email or web address]

## 2021-03-25 NOTE — Progress Notes (Signed)
PROGRESS NOTE  Eugene [name] LEX:[id number] DOB: 1955-09-23 DOA: 03/22/2021 PCP: [name], MD   LOS: 3 days   Brief narrative:  65 year old male with past medical history of chronic idiopathic pulmonary fibrosis, chronic hypoxic respiratory failure (on 3lpm  at rest and 6lpm with exertion via [place] at baseline), ischemic cardiomyopathy with systolic congestive heart failure  (last echo 01/2021 EF 45-50% with G1DD), coronary artery disease (S/P CABG 2006, cath with DES to SVG and OM2 in 06/2020 and another NSTEMI S/P cath 08/2020),  V. fib arrest (2012 S/P ICD palcement), cryptogenic stroke 11/2017, hyperlipidemia, chronic kidney disease stage IIIb, insulin-dependent diabetes mellitus type 2, paroxysmal atrial fibrillation presented to hospital due to worsening shortness of breath and increasing oxygen demand from pulmonary office as well as increasing BUN and creatinine.  Patient does have dyspnea on exertion and has been following up with [name] as outpatient.  His shortness of breath and overall functioning had deteriorated.  He has been using up to 9 L of oxygen at home and is able to ambulate a little.  He did have a telemedicine visit on 8/22 and his prednisone was increased to 20 mg a day.     In the ED, chest x-ray was concerning for pulmonary fibrosis.  Troponin was elevated at 1268.  Patient received 125 mg of IV Solu-Medrol and was admitted to hospital. Cardiology was consulted for elevated troponin and the patient was admitted to hospital.  Assessment/Plan:  Principal Problem:   Acute exacerbation of idiopathic pulmonary fibrosis (HCC) Active Problems:   Chronic systolic CHF (congestive heart failure) (HCC)   Acute renal failure superimposed on stage 3b chronic kidney disease (HCC)   Leukocytosis   Elevated troponin   Lactic acidosis   Hypokalemia, inadequate intake   Type 2 diabetes mellitus with stage 3b chronic kidney disease, with long-term current use of insulin  (HCC)  Acute exacerbation of idiopathic pulmonary fibrosis   Patient follows up with [name], pulmonary as outpatient.   Procalcitonin negative.  No indication of infection at this time.  Continue IV steroids.  CT of the chest shows worsening pulmonary fibrosis.  Currently on 6 L of oxygen by nasal cannula.  Will likely need pulmonary consultation if not improving.  Blood cultures negative in 2 days.  We will continue to monitor closely   Elevated troponin likely demand ischemia. Extensive cardiac history with CABG/AICD.  Seen by cardiology.  On heparin infusion currently.   Limited 2D echocardiogram with regional wall motion abnormality.  Cardiology planning for introducing Coumadin today.    Lactic acidosis Resolved     Chronic systolic CHF  Off IV fluids.    Acute kidney injury superimposed on stage 3b chronic kidney disease  Received some IV fluids.  Continue strict intake and output charting.  Daily weights.  Creatinine of 2.2 from 2.3    Leukocytosis Likely reactive from steroid usage.  Procalcitonin negative.  CRP 0.6 Unlikely to be infection.    Hypokalemia, Improved after replacement.    Type 2 diabetes mellitus with stage 3b chronic kidney disease, with long-term current use of insulin   Continue Accu-Cheks, sliding scale insulin and basal insulin.  Hemoglobin A1c of 9.4.  Latest POC glucose of 95.   DVT prophylaxis:   Heparin drip warfarin (COUMADIN) tablet 7.5 mg  Code Status: Full code  Family Communication:  None today.  Spoke with the wife and sister yesterday.  Status is: Inpatient  Remains inpatient appropriate because:IV treatments appropriate due to intensity  of illness or inability to take PO, Inpatient level of care appropriate due to severity of illness  Dispo: The patient is from: Home              Anticipated d/c is to: Home              Patient currently is not medically stable to d/c.   Difficult to place patient No    Consultants: Cardiology  Procedures: None  Anti-infectives:  None  Anti-infectives (From admission, onward)    None      Subjective:  Today, patient was seen and examined at bedside.  Continues to feel better with breathing.  Denies any chest pain, dizziness, lightheadedness or shortness of breath.  Has generalized weakness.  Complains of constipation.  Objective: Vitals:   03/25/21 0339 03/25/21 0756  BP: 92/69 94/64  Pulse: (!) 59 60  Resp: (!) 26 (!) 23  Temp: 97.6 F (36.4 C) (!) 97.5 F (36.4 C)  SpO2: 93% 97%    Intake/Output Summary (Last 24 hours) at 03/25/2021 1038 Last data filed at 03/25/2021 1029 Gross per 24 hour  Intake 434.52 ml  Output 1275 ml  Net -840.48 ml    Filed Weights   03/22/21 2319 03/24/21 0413 03/25/21 0339  Weight: 73.2 kg 75.7 kg 81.5 kg   Body mass index is 29.9 kg/m.   Physical Exam:  General:  Average built, not in obvious distress, on nasal cannula oxygen at 6 L/min HENT:   No scleral pallor or icterus noted. Oral mucosa is moist.  Scab over the lower part of the neck Chest: Diminished breath sounds bilaterally.  Crackles noted over the base.  Chest wall AICD.  Scar from previous surgery.   CVS: S1 &S2 heard. No murmur.  Regular rate and rhythm. Abdomen: Soft, nontender, nondistended.  Bowel sounds are heard.   Extremities: No cyanosis, clubbing or edema.  Peripheral pulses are palpable. Psych: Alert, awake and oriented, normal mood CNS:  No cranial nerve deficits.  Power equal in all extremities.   Skin: Warm and dry.  No rashes noted.   Data Review: I have personally reviewed the following laboratory data and studies,  CBC: Recent Labs  Lab 03/22/21 1244 03/22/21 1905 03/23/21 0131 03/24/21 0050 03/25/21 0043  WBC 13.5* 13.4* 11.6* 15.1* 14.9*  NEUTROABS 12.5* 11.3* 10.9*  --   --   HGB 11.3* 12.4* 11.5* 10.1* 11.3*  HCT 35.0* 38.6* 34.9* 31.7* 33.6*  MCV 87.2 88.5 86.4 88.8 86.8  PLT 353.0 356 328 348 273     Basic Metabolic Panel: Recent Labs  Lab 03/22/21 1905 03/23/21 0131 03/23/21 1518 03/24/21 0050 03/25/21 0043 03/25/21 0449  NA 134* 137 136 137 134*  --   K 2.8* 2.6* 3.7 3.5 4.4  --   CL 94* 100 100 100 104  --   CO2 27 27 26 27  18*  --   GLUCOSE 425* 179* 260* 70 117*  --   BUN 56* 48* 44* 45* 42*  --   CREATININE 2.83* 2.64* 2.54* 2.33* 2.23*  --   CALCIUM 9.4 9.1 8.7* 9.0 8.8*  --   MG  --  2.1  --  2.0  --  1.8  PHOS  --   --   --   --   --  2.2*    Liver Function Tests: Recent Labs  Lab 03/22/21 1244 03/22/21 1905 03/23/21 0131 03/25/21 0043 03/25/21 0449  AST 28 32 25 37  --  ALT 53 61* 55* QUANTITY NOT SUFFICIENT, UNABLE TO PERFORM TEST  --   ALKPHOS 79 76 72 56 56  BILITOT 0.6 0.9 0.6 QUANTITY NOT SUFFICIENT, UNABLE TO PERFORM TEST 0.9  PROT 6.4 6.3* 5.8* 5.3*  --   ALBUMIN 3.5 3.3* 3.0* 2.7*  --     No results for input(s): LIPASE, AMYLASE in the last 168 hours. No results for input(s): AMMONIA in the last 168 hours. Cardiac Enzymes: No results for input(s): CKTOTAL, CKMB, CKMBINDEX, TROPONINI in the last 168 hours. BNP (last 3 results) Recent Labs    09/16/20 0354 01/26/21 1431 03/22/21 1905  BNP 1,504.0* 419.9* 715.9*     ProBNP (last 3 results) Recent Labs    07/26/20 1033 08/04/20 1127 03/22/21 1244  PROBNP 2,139* 2,329* 821.0*     CBG: Recent Labs  Lab 03/24/21 2051 03/25/21 0453 03/25/21 0548 03/25/21 0957 03/25/21 1028  GLUCAP 182* 62* 88 63* 95    Recent Results (from the past 240 hour(s))  Culture, blood (routine x 2)     Status: None (Preliminary result)   Collection Time: 03/22/21  7:50 PM   Specimen: BLOOD  Result Value Ref Range Status   Specimen Description BLOOD RIGHT ANTECUBITAL  Final   Special Requests   Final    BOTTLES DRAWN AEROBIC AND ANAEROBIC Blood Culture results may not be optimal due to an inadequate volume of blood received in culture bottles   Culture   Final    NO GROWTH 2 DAYS Performed  at [facility] [address], [place], [place] [address]    Report Status PENDING  Incomplete  Resp Panel by RT-PCR (Flu A&B, Covid) Nasopharyngeal Swab     Status: None   Collection Time: 03/22/21  7:57 PM   Specimen: Nasopharyngeal Swab; Nasopharyngeal(NP) swabs in vial transport medium  Result Value Ref Range Status   SARS Coronavirus 2 by RT PCR NEGATIVE NEGATIVE Final    Comment: (NOTE) SARS-CoV-2 target nucleic acids are NOT DETECTED.  The SARS-CoV-2 RNA is generally detectable in upper respiratory specimens during the acute phase of infection. The lowest concentration of SARS-CoV-2 viral copies this assay can detect is 138 copies/mL. A negative result does not preclude SARS-Cov-2 infection and should not be used as the sole basis for treatment or other patient management decisions. A negative result may occur with  improper specimen collection/handling, submission of specimen other than nasopharyngeal swab, presence of viral mutation(s) within the areas targeted by this assay, and inadequate number of viral copies(<138 copies/mL). A negative result must be combined with clinical observations, patient history, and epidemiological information. The expected result is Negative.  Fact Sheet for Patients:  EntrepreneurPulse.com.au  Fact Sheet for Healthcare Providers:  IncredibleEmployment.be  This test is no t yet approved or cleared by the [place] FDA and  has been authorized for detection and/or diagnosis of SARS-CoV-2 by FDA under an Emergency Use Authorization (EUA). This EUA will remain  in effect (meaning this test can be used) for the duration of the COVID-19 declaration under Section 564(b)(1) of the Act, 21 U.S.C.section 360bbb-3(b)(1), unless the authorization is terminated  or revoked sooner.       Influenza A by PCR NEGATIVE NEGATIVE Final   Influenza B by PCR NEGATIVE NEGATIVE Final    Comment: (NOTE) The  Xpert Xpress SARS-CoV-2/FLU/RSV plus assay is intended as an aid in the diagnosis of influenza from Nasopharyngeal swab specimens and should not be used as a sole basis for treatment. Nasal  washings and aspirates are unacceptable for Xpert Xpress SARS-CoV-2/FLU/RSV testing.  Fact Sheet for Patients: EntrepreneurPulse.com.au  Fact Sheet for Healthcare Providers: IncredibleEmployment.be  This test is not yet approved or cleared by the [place] FDA and has been authorized for detection and/or diagnosis of SARS-CoV-2 by FDA under an Emergency Use Authorization (EUA). This EUA will remain in effect (meaning this test can be used) for the duration of the COVID-19 declaration under Section 564(b)(1) of the Act, 21 U.S.C. section 360bbb-3(b)(1), unless the authorization is terminated or revoked.  Performed at [facility], [place] [address] [place], [place] [address]   Culture, blood (routine x 2)     Status: None (Preliminary result)   Collection Time: 03/22/21  8:04 PM   Specimen: BLOOD  Result Value Ref Range Status   Specimen Description BLOOD LEFT ANTECUBITAL  Final   Special Requests   Final    BOTTLES DRAWN AEROBIC AND ANAEROBIC Blood Culture results may not be optimal due to an inadequate volume of blood received in culture bottles   Culture   Final    NO GROWTH 2 DAYS Performed at [facility], [place] [address] [place], [place] [address]    Report Status PENDING  Incomplete  MRSA Next Gen by PCR, Nasal     Status: None   Collection Time: 03/22/21 11:15 PM   Specimen: Nasal Mucosa; Nasal Swab  Result Value Ref Range Status   MRSA by PCR Next Gen NOT DETECTED NOT DETECTED Final    Comment: (NOTE) The GeneXpert MRSA Assay (FDA approved for NASAL specimens only), is one component of a comprehensive MRSA colonization surveillance program. It is not intended to diagnose MRSA infection nor to guide or monitor treatment for MRSA  infections. Test performance is not FDA approved in patients less than 25 years old. Performed at [facility], [place] [address] [place], [place] [address]       Studies: ECHOCARDIOGRAM LIMITED  Result Date: 03/24/2021    ECHOCARDIOGRAM LIMITED REPORT   Patient Name:   Eugene [name] [facility] Date of Exam: 03/24/2021 Medical Rec #:  [id number]    Height:       65.0 in Accession #:    [id number]   Weight:       166.9 lb Date of Birth:  [birth year]   BSA:          1.832 m Patient Age:    68 years     BP:           101/64 mmHg Patient Gender: M            HR:           77 bpm. Exam Location:  Inpatient Procedure: Limited Echo, Cardiac Doppler, Color Doppler and Intracardiac            Opacification Agent Indications:    [id number] Subsequent ST elevation (STEM) and non-ST elevation                 (NSTEMI) myocardial infarction  History:        Patient has prior history of Echocardiogram examinations, most                 recent 01/27/2021. CHF, Previous Myocardial Infarction and CAD,                 TIA and Stroke; Risk Factors:Dyslipidemia, Diabetes and Sleep  Apnea.  Sonographer:    Roseanna Rainbow RDCS Referring Phys: [id number] [facility]  Sonographer Comments: Technically difficult study due to poor echo windows. Image acquisition challenging due to patient body habitus. IMPRESSIONS  1. Left ventricular ejection fraction, by estimation, is 45%. The left ventricle has mildly decreased function. The left ventricle demonstrates regional wall motion abnormalities (suggestive of LAD and RCA disease).  2. Right ventricular systolic function is normal. Tricuspid regurgitation signal is inadequate for assessing PA pressure.  3. Trivial mitral valve regurgitation. No evidence of mitral stenosis.  4. The aortic valve is calcified. Aortic valve regurgitation is not visualized. Mild to moderate aortic valve sclerosis/calcification is present, without any evidence of aortic stenosis.  5. The inferior vena  cava is normal in size with <50% respiratory variability, suggesting right atrial pressure of 8 mmHg. Comparison(s): A prior study was performed on 01/27/21. No significant change from prior study. Prior images reviewed side by side. FINDINGS  Left Ventricle: Left ventricular ejection fraction, by estimation, is 45%. The left ventricle has mildly decreased function. The left ventricle demonstrates regional wall motion abnormalities. Definity contrast agent was given IV to delineate the left ventricular endocardial borders.  LV Wall Scoring: The anterior wall, anterior septum, inferior wall, mid inferoseptal segment, and basal inferoseptal segment are hypokinetic. Right Ventricle: Right ventricular systolic function is normal. Tricuspid regurgitation signal is inadequate for assessing PA pressure. Mitral Valve: Trivial mitral valve regurgitation. No evidence of mitral valve stenosis. Aortic Valve: The aortic valve is calcified. Aortic valve regurgitation is not visualized. Mild to moderate aortic valve sclerosis/calcification is present, without any evidence of aortic stenosis. Aortic valve mean gradient measures 9.0 mmHg. Aortic valve peak gradient measures 16.8 mmHg. Aortic valve area, by VTI measures 1.86 cm. Pulmonic Valve: The pulmonic valve was grossly normal. Venous: The inferior vena cava is normal in size with less than 50% respiratory variability, suggesting right atrial pressure of 8 mmHg. Additional Comments: A device lead is visualized. LEFT VENTRICLE PLAX 2D LVIDd:         4.15 cm LVIDs:         3.90 cm LV PW:         1.50 cm LV IVS:        1.00 cm LVOT diam:     1.90 cm LV SV:         76 LV SV Index:   41 LVOT Area:     2.84 cm  LV Volumes (MOD) LV vol d, MOD A2C: 117.0 ml LV vol d, MOD A4C: 108.0 ml LV vol s, MOD A2C: 69.3 ml LV vol s, MOD A4C: 62.1 ml LV SV MOD A2C:     47.7 ml LV SV MOD A4C:     108.0 ml LV SV MOD BP:      51.1 ml IVC IVC diam: 2.00 cm LEFT ATRIUM         Index LA diam:    4.00 cm  2.18 cm/m  AORTIC VALVE AV Area (Vmax):    1.88 cm AV Area (Vmean):   1.75 cm AV Area (VTI):     1.86 cm AV Vmax:           205.00 cm/s AV Vmean:          141.000 cm/s AV VTI:            0.409 m AV Peak Grad:      16.8 mmHg AV Mean Grad:      9.0 mmHg LVOT Vmax:  136.00 cm/s LVOT Vmean:        87.000 cm/s LVOT VTI:          0.268 m LVOT/AV VTI ratio: 0.66  AORTA Ao Root diam: 3.40 cm Ao Asc diam:  3.30 cm MITRAL VALVE MV Area (PHT): 4.89 cm     SHUNTS MV Decel Time: 155 msec     Systemic VTI:  0.27 m MV E velocity: 103.00 cm/s  Systemic Diam: 1.90 cm MV A velocity: 70.80 cm/s MV E/A ratio:  1.45 Rudean Haskell MD Electronically signed by Rudean Haskell MD Signature Date/Time: 03/24/2021/3:16:08 PM    Final       Flora Lipps, MD  [facility] 03/25/2021  If 7PM-7AM, please contact night-coverage

## 2021-03-25 NOTE — Progress Notes (Addendum)
Progress Note  Patient Name: Eugene [name] Date of Encounter: 03/25/2021  Primary Cardiologist: Werner Lean, MD   Subjective   Notes feeling slight improvement on IV steroids.  No change in echo last night.  Patient is anxious to see Dr. Alfonso Patten today.  Inpatient Medications    Scheduled Meds:  clopidogrel  75 mg Oral Daily   insulin aspart  0-15 Units Subcutaneous TID AC & HS   insulin glargine-yfgn  60 Units Subcutaneous Daily   methylPREDNISolone (SOLU-MEDROL) injection  120 mg Intravenous Daily   metoprolol succinate  50 mg Oral BID   mycophenolate  1,000 mg Oral BID   pantoprazole  40 mg Oral Daily   potassium chloride  40 mEq Oral Daily   ranolazine  500 mg Oral BID   rosuvastatin  40 mg Oral Daily   sodium chloride flush  3 mL Intravenous Q12H   warfarin  7.5 mg Oral ONCE-1600   Warfarin - Pharmacist Dosing Inpatient   Does not apply q1600   Continuous Infusions:  sodium chloride     heparin 1,050 Units/hr (03/24/21 1737)   PRN Meds: sodium chloride, acetaminophen, guaiFENesin-dextromethorphan, ipratropium-albuterol, nitroGLYCERIN, ondansetron (ZOFRAN) IV, polyethylene glycol, sodium chloride, sodium chloride flush   Vital Signs    Vitals:   03/24/21 1944 03/24/21 2339 03/25/21 0339 03/25/21 0756  BP: 97/66 93/67 92/69  94/64  Pulse: 69 70 (!) 59 60  Resp: (!) 24 19 (!) 26 (!) 23  Temp: 98.3 F (36.8 C) 98.3 F (36.8 C) 97.6 F (36.4 C) (!) 97.5 F (36.4 C)  TempSrc: Oral Oral  Oral  SpO2: 92% 95% 93% 97%  Weight:   81.5 kg   Height:        Intake/Output Summary (Last 24 hours) at 03/25/2021 0849 Last data filed at 03/25/2021 0759 Gross per 24 hour  Intake 434.52 ml  Output 1725 ml  Net -1290.48 ml   Filed Weights   03/22/21 2319 03/24/21 0413 03/25/21 0339  Weight: 73.2 kg 75.7 kg 81.5 kg    Telemetry    SR with PACs - Personally Reviewed  ECG    No new - Personally Reviewed  Physical Exam   GEN: No acute distress.   Neck: No  JVD Cardiac: RRR, no murmurs, rubs, or gallops.  Respiratory: Coarse breath sounds bilaterally with minimally air movement GI: Soft, nontender, non-distended  MS: No edema; No deformity. Neuro:  Nonfocal  Psych: Normal affect   Labs    Chemistry Recent Labs  Lab 03/22/21 1905 03/23/21 0131 03/23/21 1518 03/24/21 0050 03/25/21 0043 03/25/21 0449  NA 134* 137 136 137 134*  --   K 2.8* 2.6* 3.7 3.5 4.4  --   CL 94* 100 100 100 104  --   CO2 27 27 26 27  18*  --   GLUCOSE 425* 179* 260* 70 117*  --   BUN 56* 48* 44* 45* 42*  --   CREATININE 2.83* 2.64* 2.54* 2.33* 2.23*  --   CALCIUM 9.4 9.1 8.7* 9.0 8.8*  --   PROT 6.3* 5.8*  --   --  5.3*  --   ALBUMIN 3.3* 3.0*  --   --  2.7*  --   AST 32 25  --   --  37  --   ALT 61* 55*  --   --  QUANTITY NOT SUFFICIENT, UNABLE TO PERFORM TEST  --   ALKPHOS 76 72  --   --  56 56  BILITOT 0.9  0.6  --   --  QUANTITY NOT SUFFICIENT, UNABLE TO PERFORM TEST 0.9  GFRNONAA 24* 26* 27* 30* 32*  --   ANIONGAP 13 10 10 10 12   --      Hematology Recent Labs  Lab 03/23/21 0131 03/24/21 0050 03/25/21 0043  WBC 11.6* 15.1* 14.9*  RBC 4.04* 3.57* 3.87*  HGB 11.5* 10.1* 11.3*  HCT 34.9* 31.7* 33.6*  MCV 86.4 88.8 86.8  MCH 28.5 28.3 29.2  MCHC 33.0 31.9 33.6  RDW 15.0 15.4 15.8*  PLT 328 348 273    Cardiac EnzymesNo results for input(s): TROPONINI in the last 168 hours. No results for input(s): TROPIPOC in the last 168 hours.   BNP Recent Labs  Lab 03/22/21 1244 03/22/21 1905  BNP  --  715.9*  PROBNP 821.0*  --      DDimer No results for input(s): DDIMER in the last 168 hours.   Radiology    ECHOCARDIOGRAM LIMITED  Result Date: 03/24/2021    ECHOCARDIOGRAM LIMITED REPORT   Patient Name:   Eugene [name] [facility] Date of Exam: 03/24/2021 Medical Rec #:  [id number]    Height:       65.0 in Accession #:    [id number]   Weight:       166.9 lb Date of Birth:  September 10, 1955   BSA:          1.832 m Patient Age:    1 years     BP:           101/64  mmHg Patient Gender: M            HR:           77 bpm. Exam Location:  Inpatient Procedure: Limited Echo, Cardiac Doppler, Color Doppler and Intracardiac            Opacification Agent Indications:    122-I22.9 Subsequent ST elevation (STEM) and non-ST elevation                 (NSTEMI) myocardial infarction  History:        Patient has prior history of Echocardiogram examinations, most                 recent 01/27/2021. CHF, Previous Myocardial Infarction and CAD,                 TIA and Stroke; Risk Factors:Dyslipidemia, Diabetes and Sleep                 Apnea.  Sonographer:    Roseanna Rainbow RDCS Referring Phys: [id number] [facility]  Sonographer Comments: Technically difficult study due to poor echo windows. Image acquisition challenging due to patient body habitus. IMPRESSIONS  1. Left ventricular ejection fraction, by estimation, is 45%. The left ventricle has mildly decreased function. The left ventricle demonstrates regional wall motion abnormalities (suggestive of LAD and RCA disease).  2. Right ventricular systolic function is normal. Tricuspid regurgitation signal is inadequate for assessing PA pressure.  3. Trivial mitral valve regurgitation. No evidence of mitral stenosis.  4. The aortic valve is calcified. Aortic valve regurgitation is not visualized. Mild to moderate aortic valve sclerosis/calcification is present, without any evidence of aortic stenosis.  5. The inferior vena cava is normal in size with <50% respiratory variability, suggesting right atrial pressure of 8 mmHg. Comparison(s): A prior study was performed on 01/27/21. No significant change from prior study. Prior images reviewed side by side. FINDINGS  Left Ventricle: Left ventricular ejection  fraction, by estimation, is 45%. The left ventricle has mildly decreased function. The left ventricle demonstrates regional wall motion abnormalities. Definity contrast agent was given IV to delineate the left ventricular endocardial borders.  LV  Wall Scoring: The anterior wall, anterior septum, inferior wall, mid inferoseptal segment, and basal inferoseptal segment are hypokinetic. Right Ventricle: Right ventricular systolic function is normal. Tricuspid regurgitation signal is inadequate for assessing PA pressure. Mitral Valve: Trivial mitral valve regurgitation. No evidence of mitral valve stenosis. Aortic Valve: The aortic valve is calcified. Aortic valve regurgitation is not visualized. Mild to moderate aortic valve sclerosis/calcification is present, without any evidence of aortic stenosis. Aortic valve mean gradient measures 9.0 mmHg. Aortic valve peak gradient measures 16.8 mmHg. Aortic valve area, by VTI measures 1.86 cm. Pulmonic Valve: The pulmonic valve was grossly normal. Venous: The inferior vena cava is normal in size with less than 50% respiratory variability, suggesting right atrial pressure of 8 mmHg. Additional Comments: A device lead is visualized. LEFT VENTRICLE PLAX 2D LVIDd:         4.15 cm LVIDs:         3.90 cm LV PW:         1.50 cm LV IVS:        1.00 cm LVOT diam:     1.90 cm LV SV:         76 LV SV Index:   41 LVOT Area:     2.84 cm  LV Volumes (MOD) LV vol d, MOD A2C: 117.0 ml LV vol d, MOD A4C: 108.0 ml LV vol s, MOD A2C: 69.3 ml LV vol s, MOD A4C: 62.1 ml LV SV MOD A2C:     47.7 ml LV SV MOD A4C:     108.0 ml LV SV MOD BP:      51.1 ml IVC IVC diam: 2.00 cm LEFT ATRIUM         Index LA diam:    4.00 cm 2.18 cm/m  AORTIC VALVE AV Area (Vmax):    1.88 cm AV Area (Vmean):   1.75 cm AV Area (VTI):     1.86 cm AV Vmax:           205.00 cm/s AV Vmean:          141.000 cm/s AV VTI:            0.409 m AV Peak Grad:      16.8 mmHg AV Mean Grad:      9.0 mmHg LVOT Vmax:         136.00 cm/s LVOT Vmean:        87.000 cm/s LVOT VTI:          0.268 m LVOT/AV VTI ratio: 0.66  AORTA Ao Root diam: 3.40 cm Ao Asc diam:  3.30 cm MITRAL VALVE MV Area (PHT): 4.89 cm     SHUNTS MV Decel Time: 155 msec     Systemic VTI:  0.27 m MV E velocity:  103.00 cm/s  Systemic Diam: 1.90 cm MV A velocity: 70.80 cm/s MV E/A ratio:  1.45 Rudean Haskell MD Electronically signed by Rudean Haskell MD Signature Date/Time: 03/24/2021/3:16:08 PM    Final     Cardiac Studies   09-12-20 LHC Prox LAD lesion is 99% stenosed. Ost LAD to Prox LAD lesion is 100% stenosed. Mid LAD lesion is 80% stenosed. 1st Mrg lesion is 99% stenosed. Ost Cx to Prox Cx lesion is 80% stenosed. Mid Cx lesion is 100% stenosed. Balloon angioplasty was  performed. Prox RCA to Dist RCA lesion is 100% stenosed with 100% stenosed side branch in RPAV. Ost Ramus lesion is 100% stenosed. SVG and is normal in caliber. Previously placed Prox Graft drug eluting stent is widely patent. LIMA and is large. The graft exhibits no disease. SVG and is normal in caliber. Origin to Prox Graft lesion is 100% stenosed. SVG and is large. Prox Graft to Dist Graft lesion is 100% stenosed. Dist Cx lesion is 95% stenosed. Mid LM to Dist LM lesion is 80% stenosed. Post intervention, there is a 20% residual stenosis.   Severe native coronary obstructive disease 80% distal left main stenosis, total occlusion of the LAD, ramus immediate vessel, high-grade circumflex stenoses, total occlusion of the proximal RCA.  There was a shunt to the distal RCA via left to right and right to right collateralization.   Patent LIMA graft supplying the mid LAD with diffuse disease in the LAD after insertion.   Supplying the OM 2 vessel with widely patent stent in the proximal portion of the SVG.  There is 95% in-stent restenosis in the native OM 2 vessel beyond the anastomosis.   Old occlusion of the vein graft which had supplied the ramus vessel.   Old occlusion of the vein graft which had supplied the PDA.   Difficult but ultimately successful intervention through the SVG to the distal OM 2 vessel requiring PTCA, [name] Cutting Balloon, and ultimate noncompliant balloon dilatation with a 95%  stenosis being reduced to less than 20%.   LVEDP: 11 mmHg   RECOMMENDATION: DAPT probably indefinitely.  Since the patient developed early in-stent restenosis on aspirin/Plavix, Brilinta was utilized for more aggressive antiplatelet therapy.  Medical therapy for concomitant CAD.  Optimal blood pressure control, diabetes management, and aggressive lipid therapy with target LDL less than 70.   01-27-21 TTE 1. Left ventricular ejection fraction, by estimation, is 45 to 50%. The  left ventricle has mildly decreased function. The left ventricle  demonstrates regional wall motion abnormalities.      There is hypokinesis of the basal-to-mid anteroseptal, basal-to-mid  anterior, basal-to-mid inferior and basal inferoseptal LV walls. Left  ventricular diastolic parameters are consistent with Grade I diastolic  dysfunction (impaired relaxation).   2. Right ventricular systolic function is mildly reduced. The right  ventricular size is not well visualized.   3. The mitral valve is grossly normal. Trivial mitral valve  regurgitation. No evidence of mitral stenosis.   4. The aortic valve is tricuspid. There is mild calcification of the  aortic valve. There is moderate thickening of the aortic valve. Aortic  valve regurgitation is not visualized. Mild to moderate aortic valve  sclerosis/calcification is present, without  any evidence of aortic stenosis.   Patient Profile     65 y.o. 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, on rosuvastatin;  cannot afford PSCK9i he was previously on - had anxiety attacks with Brilinta - continue ranolazine 500 mg PO BID  PAF CHADSVASC 7 - on AC as above - continue  metoprolol succinate 50 mg (BID dose is atypical but he has tolerated it well)  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 HTN with DM - concern for intravascular depletion and s/p IVF - holding his lasix 40 mg PO Daily - cautious uptitation of GDMT given kidneys; continues to improve - outpatient SGLT2i may be considered  VF and VT s/p ICD - presently shock free on BB - avoid amio give IPF - if worsening VT/VF mexilitine would be considered   IPF - Per pulm or primary - concern that this is the issue driving his care; Pulmonary consult may be reasonable  Potential 03/26/21 Sign Off unless new issues occur   For questions or updates, please contact [facility] Please consult [email or web address] for contact info under Cardiology/STEMI.      Signed, Werner Lean, MD  03/25/2021, 8:49 AM

## 2021-03-26 ENCOUNTER — Other Ambulatory Visit (HOSPITAL_COMMUNITY): Payer: Self-pay

## 2021-03-26 ENCOUNTER — Telehealth: Payer: Self-pay | Admitting: Internal Medicine

## 2021-03-26 DIAGNOSIS — J84112 Idiopathic pulmonary fibrosis: Secondary | ICD-10-CM | POA: Diagnosis not present

## 2021-03-26 LAB — GLUCOSE, CAPILLARY
Glucose-Capillary: 94 mg/dL (ref 70–99)
Glucose-Capillary: 140 mg/dL — ABNORMAL HIGH (ref 70–99)

## 2021-03-26 LAB — CBC
HCT: 34.5 % — ABNORMAL LOW (ref 39.0–52.0)
Hemoglobin: 10.8 g/dL — ABNORMAL LOW (ref 13.0–17.0)
MCH: 28.3 pg (ref 26.0–34.0)
MCHC: 31.3 g/dL (ref 30.0–36.0)
MCV: 90.3 fL (ref 80.0–100.0)
Platelets: 332 10*3/uL (ref 150–400)
RBC: 3.82 MIL/uL — ABNORMAL LOW (ref 4.22–5.81)
RDW: 15.9 % — ABNORMAL HIGH (ref 11.5–15.5)
WBC: 13.9 10*3/uL — ABNORMAL HIGH (ref 4.0–10.5)
nRBC: 0 % (ref 0.0–0.2)

## 2021-03-26 LAB — BASIC METABOLIC PANEL
Anion gap: 10 (ref 5–15)
BUN: 39 mg/dL — ABNORMAL HIGH (ref 8–23)
CO2: 24 mmol/L (ref 22–32)
Calcium: 9 mg/dL (ref 8.9–10.3)
Chloride: 103 mmol/L (ref 98–111)
Creatinine, Ser: 2.06 mg/dL — ABNORMAL HIGH (ref 0.61–1.24)
GFR, Estimated: 35 mL/min — ABNORMAL LOW (ref >60)
Glucose, Bld: 123 mg/dL — ABNORMAL HIGH (ref 70–99)
Potassium: 4.3 mmol/L (ref 3.5–5.1)
Sodium: 137 mmol/L (ref 135–145)

## 2021-03-26 LAB — PROTIME-INR
INR: 1.4 — ABNORMAL HIGH (ref 0.8–1.2)
Prothrombin Time: 17.6 seconds — ABNORMAL HIGH (ref 11.4–15.2)

## 2021-03-26 LAB — HEPARIN LEVEL (UNFRACTIONATED)
Heparin Unfractionated: 0.2 IU/mL — ABNORMAL LOW (ref 0.30–0.70)
Heparin Unfractionated: 0.4 IU/mL (ref 0.30–0.70)

## 2021-03-26 LAB — MAGNESIUM: Magnesium: 1.9 mg/dL (ref 1.7–2.4)

## 2021-03-26 MED ORDER — WARFARIN SODIUM 7.5 MG PO TABS: 7.5000 mg | ORAL_TABLET | Freq: Once | ORAL | Status: DC

## 2021-03-26 MED ORDER — PREDNISONE 10 MG PO TABS: ORAL_TABLET | ORAL | 1 refills | Status: AC

## 2021-03-26 NOTE — TOC Initial Note (Signed)
[facility]) - Initial/Assessment Note    Patient Details  Name: Eugene [name] MRN: [id number] Date of Birth: [birth year]  Transition of Care [facility]) CM/SW Contact:    Zenon Mayo, RN Phone Number: 03/26/2021, 10:29 AM  Clinical Narrative:                 From home with wife, he has walker and shower seat at home.  W[facility] to [facility]. He is active with [facility] for HHPT and would like to continue with [facility].  NCM notified Stacie of dc today. Wife will bring his oxygen tank her to transport him home.    Expected Discharge Plan: [place] Barriers to Discharge: No Barriers Identified   Patient Goals and CMS Choice Patient states their goals for this hospitalization and ongoing recovery are:: return  home with [facility] CMS Medicare.gov Compare Post Acute Care list provided to:: Patient Choice offered to / list presented to : Patient  Expected Discharge Plan and Services Expected Discharge Plan: [place]   Discharge Planning Services: CM Consult Post Acute Care Choice: [place] arrangements for the past 2 months: Single Family Home Expected Discharge Date: 03/26/21                         HH Arranged: PT HH Agency: [facility] Date HH Agency Contacted: 03/26/21 Time HH Agency Contacted: 51 Representative spoke with at [place]: [place] Arrangements/Services Living arrangements for the past 2 months: [place] with:: Spouse Patient language and need for interpreter reviewed:: Yes Do you feel safe going back to the place where you live?: Yes      Need for Family Participation in Patient Care: Yes (Comment) Care giver support system in place?: Yes (comment) Current home services: DME (shower seat, walker,home oxygen 5 liters) Criminal Activity/Legal Involvement Pertinent to Current Situation/Hospitalization: No - Comment as needed  Activities of Daily  Living Home Assistive Devices/Equipment: CBG Meter, Eyeglasses, Oxygen, Shower chair with back, Walker (specify type), Cane (specify quad or straight) ADL Screening (condition at time of admission) Patient's cognitive ability adequate to safely complete daily activities?: Yes Is the patient deaf or have difficulty hearing?: No Does the patient have difficulty seeing, even when wearing glasses/contacts?: No Does the patient have difficulty concentrating, remembering, or making decisions?: No Patient able to express need for assistance with ADLs?: Yes Does the patient have difficulty dressing or bathing?: Yes Independently performs ADLs?: No Communication: Independent Dressing (OT): Needs assistance Is this a change from baseline?: Pre-admission baseline Feeding: Independent Bathing: Needs assistance Is this a change from baseline?: Pre-admission baseline Toileting: Independent with device (comment) In/Out Bed: Independent with device (comment) Walks in Home: Independent with device (comment) Does the patient have difficulty walking or climbing stairs?: Yes Weakness of Legs: Both Weakness of Arms/Hands: None  Permission Sought/Granted                  Emotional Assessment Appearance:: Appears stated age Attitude/Demeanor/Rapport: Engaged Affect (typically observed): Appropriate Orientation: : Oriented to Self, Oriented to Place, Oriented to  Time, Oriented to Situation Alcohol / Substance Use: Not Applicable Psych Involvement: No (comment)  Admission diagnosis:  Shortness of breath [R06.02] Elevated troponin [R77.8] Acute exacerbation of idiopathic pulmonary fibrosis ([place]) [L87.564] Patient Active Problem List   Diagnosis Date Noted   Acute exacerbation of idiopathic pulmonary fibrosis ([place]) 03/22/2021   Chronic  systolic CHF (congestive heart failure) ([place]) 03/22/2021   Acute renal failure superimposed on stage 3b chronic kidney disease ([place]) 03/22/2021   Leukocytosis  03/22/2021   Elevated troponin 03/22/2021   Lactic acidosis 03/22/2021   Hypokalemia, inadequate intake 03/22/2021   Type 2 diabetes mellitus with stage 3b chronic kidney disease, with long-term current use of insulin ([place]) 03/22/2021   Chronic respiratory failure with hypoxia ([place]) 03/01/2021   Acute exacerbation of CHF (congestive heart failure) ([place]) 01/27/2021   Statin myopathy 10/03/2020   Hyperlipidemia associated with type 2 diabetes mellitus ([place]) 09/21/2020   History of COVID-19 09/21/2020   ILD (interstitial lung disease) ([place])    NSTEMI (non-ST elevated myocardial infarction) ([place]) 09/09/2020   Rhabdomyolysis 09/09/2020   Chronic kidney disease, stage 3b ([place]) 09/09/2020   Unintended weight loss 09/09/2020   Weakness of both legs 09/09/2020   Elevated LFTs 09/09/2020   Prolonged QT interval 09/09/2020   Weak urine stream 09/09/2020   Weakness    Unstable angina ([place]) 07/06/2020   H/O insulin dependent diabetes mellitus 06/27/2020   Loss of weight 06/27/2020   Constipation 06/27/2020   Vomiting 06/27/2020   History of colonic polyps 06/27/2020   Coronary artery disease involving coronary bypass graft of native heart with angina pectoris ([place]) 02/19/2018   Angina, class III ([place]) 02/19/2018   Abnormal nuclear stress test 02/19/2018   Diabetic polyneuropathy associated with type 2 diabetes mellitus ([place]) 02/18/2017   Lumbosacral radiculopathy at L4 02/18/2017   Vitamin D deficiency 02/18/2017   Gastroesophageal reflux disease without esophagitis 05/14/2016   Stasis ulcer of right ankle ([place]) 10/25/2014   TIA (transient ischemic attack) 08/09/2014   History of CVA (cerebrovascular accident) 06/02/2014   Family history of colon cancer in mother - 72 02/03/2014   Benign neoplasm of colon- adenoma 01/12/2014   Pain in joint, ankle and foot 11/30/2013   PAD (peripheral artery disease) ([place]) 11/30/2013   Snoring 10/12/2013   Hypersomnia 10/12/2013   Anxiety    Sleep apnea  08/17/2013   Hypertension associated with diabetes ([place]) 07/20/2013   Obesity 04/20/2013   CHF (congestive heart failure) ([place]) 03/10/2013   Ischemic heart disease due to coronary artery obstruction ([place]) 03/10/2013   Cervical spondylosis 03/01/2013   DDD (degenerative disc disease), cervical 03/01/2013   Single implantable cardioverter-defibrillator (ICD) in situ 12/11/2010   Hyperlipidemia 09/27/2010   HYPERTENSION, BENIGN 09/27/2010   CAD, NATIVE VESSEL 09/27/2010   VENTRICULAR FIBRILLATION 09/27/2010   PCP:  [name], MD Pharmacy:   [facility] [place], [place] - [address] [place] [address] Phone: [phone] Fax: [place] [address] [place] [address] Phone: [phone] Fax: [phone]     Social Determinants of Health (SDOH) Interventions    Readmission Risk Interventions Readmission Risk Prevention Plan 03/26/2021  Transportation Screening Complete  PCP or Specialist Appt within 3-5 Days Complete  HRI or [place] Complete  Social Work Consult for [place] Planning/Counseling Complete  Palliative Care Screening Not Applicable  Medication Review Press photographer) Complete  Some recent data might be hidden

## 2021-03-26 NOTE — TOC Transition Note (Signed)
Transition of Care [facility]) - CM/SW Discharge Note   Patient Details  Name: Eugene [name] MRN: [id number] Date of Birth: 1956-04-08  Transition of Care [facility]) CM/SW Contact:  Zenon Mayo, RN Phone Number: 03/26/2021, 10:32 AM   Clinical Narrative:    From home with wife, he has walker and shower seat at home.  Wife takes him to [facility]. He is active with [facility] for HHPT and would like to continue with [facility].  NCM notified Stacie of dc today. Wife will bring his oxygen tank her to transport him home.      Final next level of care: [place] Barriers to Discharge: No Barriers Identified   Patient Goals and CMS Choice Patient states their goals for this hospitalization and ongoing recovery are:: return home CMS Medicare.gov Compare Post Acute Care list provided to:: Patient Choice offered to / list presented to : Patient  Discharge Placement                       Discharge Plan and Services   Discharge Planning Services: CM Consult Post Acute Care Choice: Home Health                    HH Arranged: PT [place]: [place] Date [place]: 03/26/21 Time [place]: 3112 Representative spoke with at [place]: [place] ([place]) Interventions     Readmission Risk Interventions Readmission Risk Prevention Plan 03/26/2021  Transportation Screening Complete  PCP or Specialist Appt within 3-5 Days Complete  HRI or [place] Complete  Social Work Consult for [place] Planning/Counseling Complete  Palliative Care Screening Not Applicable  Medication Review Press photographer) Complete  Some recent data might be hidden

## 2021-03-26 NOTE — Plan of Care (Signed)
  Problem: Education: Goal: Knowledge of General Education information will improve Description: Including pain rating scale, medication(s)/side effects and non-pharmacologic comfort measures Outcome: Adequate for Discharge   Problem: Health Behavior/Discharge Planning: Goal: Ability to manage health-related needs will improve Outcome: Adequate for Discharge   Problem: Clinical Measurements: Goal: Ability to maintain clinical measurements within normal limits will improve Outcome: Adequate for Discharge Goal: Will remain free from infection Outcome: Adequate for Discharge Goal: Diagnostic test results will improve Outcome: Adequate for Discharge Goal: Respiratory complications will improve Outcome: Adequate for Discharge Goal: Cardiovascular complication will be avoided Outcome: Adequate for Discharge   Problem: Activity: Goal: Risk for activity intolerance will decrease Outcome: Adequate for Discharge   Problem: Nutrition: Goal: Adequate nutrition will be maintained Outcome: Adequate for Discharge   Problem: Coping: Goal: Level of anxiety will decrease Outcome: Adequate for Discharge   Problem: Elimination: Goal: Will not experience complications related to bowel motility Outcome: Adequate for Discharge Goal: Will not experience complications related to urinary retention Outcome: Adequate for Discharge   Problem: Pain Managment: Goal: General experience of comfort will improve Outcome: Adequate for Discharge   Problem: Safety: Goal: Ability to remain free from injury will improve Outcome: Adequate for Discharge   Problem: Skin Integrity: Goal: Risk for impaired skin integrity will decrease Outcome: Adequate for Discharge   Problem: Acute Rehab PT Goals(only PT should resolve) Goal: Pt Will Go Supine/Side To Sit Outcome: Adequate for Discharge Goal: Patient Will Transfer Sit To/From Stand Outcome: Adequate for Discharge Goal: Pt Will Ambulate Outcome: Adequate  for Discharge Goal: Pt/caregiver will Perform Home Exercise Program Outcome: Adequate for Discharge   

## 2021-03-26 NOTE — TOC Benefit Eligibility Note (Signed)
Patient Teacher, English as a foreign language completed.    The patient is currently admitted and upon discharge could be taking Dapsone 100 mg.  The current 30 day co-pay is, $8.23.   The patient is insured through [place], [place] Patient Advocate Specialist [place] Team Direct Number: [phone]  Fax: [phone]

## 2021-03-26 NOTE — Progress Notes (Signed)
Cardiology Progress Note  Patient ID: Eugene [name] MRN: [id number] DOB: [birth year] Date of Encounter: 03/26/2021  Primary Cardiologist: Werner Lean, MD  Subjective   Chief Complaint: Shortness of breath  HPI: Reports shortness of breath slowly improving.  Not back to baseline.  Denies any chest pain or pressure.  Telemetry unremarkable.  ROS:  All other ROS reviewed and negative. Pertinent positives noted in the HPI.     Inpatient Medications  Scheduled Meds:  clopidogrel  75 mg Oral Daily   insulin aspart  0-15 Units Subcutaneous TID WC   insulin glargine-yfgn  60 Units Subcutaneous Daily   methylPREDNISolone (SOLU-MEDROL) injection  120 mg Intravenous Daily   metoprolol succinate  50 mg Oral BID   mycophenolate  1,000 mg Oral BID   pantoprazole  40 mg Oral Daily   polyethylene glycol  17 g Oral BID   potassium chloride  40 mEq Oral Daily   ranolazine  500 mg Oral BID   rosuvastatin  40 mg Oral Daily   sodium chloride flush  3 mL Intravenous Q12H   warfarin  7.5 mg Oral ONCE-1600   Warfarin - Pharmacist Dosing Inpatient   Does not apply q1600   Continuous Infusions:  sodium chloride     heparin 1,200 Units/hr (03/26/21 0743)   PRN Meds: sodium chloride, acetaminophen, guaiFENesin-dextromethorphan, ipratropium-albuterol, nitroGLYCERIN, ondansetron (ZOFRAN) IV, sodium chloride, sodium chloride flush   Vital Signs   Vitals:   03/25/21 1617 03/25/21 1933 03/25/21 2351 03/26/21 0310  BP: 114/70 110/64 116/81 108/63  Pulse: 82 77 71 61  Resp: (!) 26 (!) 23 (!) 24 (!) 26  Temp: (!) 97.4 F (36.3 C) 97.6 F (36.4 C) 97.7 F (36.5 C) 97.6 F (36.4 C)  TempSrc: Oral Oral Oral Oral  SpO2: 95% 96% 93% 93%  Weight:    78.9 kg  Height:        Intake/Output Summary (Last 24 hours) at 03/26/2021 0757 Last data filed at 03/26/2021 0400 Gross per 24 hour  Intake 123 ml  Output 1425 ml  Net -1302 ml   Last 3 Weights 03/26/2021 03/25/2021 03/24/2021  Weight (lbs)  173 lb 15.1 oz 179 lb 10.8 oz 166 lb 14.2 oz  Weight (kg) 78.9 kg 81.5 kg 75.7 kg      Telemetry  Overnight telemetry shows sinus rhythm in the 70s, PACs noted, which I personally reviewed.   ECG  The most recent ECG shows sinus rhythm heart rate 77, left posterior fascicular block, right bundle branch block, nonspecific ST-T changes, which I personally reviewed.   Physical Exam   Vitals:   03/25/21 1617 03/25/21 1933 03/25/21 2351 03/26/21 0310  BP: 114/70 110/64 116/81 108/63  Pulse: 82 77 71 61  Resp: (!) 26 (!) 23 (!) 24 (!) 26  Temp: (!) 97.4 F (36.3 C) 97.6 F (36.4 C) 97.7 F (36.5 C) 97.6 F (36.4 C)  TempSrc: Oral Oral Oral Oral  SpO2: 95% 96% 93% 93%  Weight:    78.9 kg  Height:        Intake/Output Summary (Last 24 hours) at 03/26/2021 0757 Last data filed at 03/26/2021 0400 Gross per 24 hour  Intake 123 ml  Output 1425 ml  Net -1302 ml    Last 3 Weights 03/26/2021 03/25/2021 03/24/2021  Weight (lbs) 173 lb 15.1 oz 179 lb 10.8 oz 166 lb 14.2 oz  Weight (kg) 78.9 kg 81.5 kg 75.7 kg    Body mass index is 28.95 kg/m.   General:  Well nourished, well developed, in no acute distress Head: Atraumatic, normal size  Eyes: PEERLA, EOMI  Neck: Supple, no JVD Endocrine: No thryomegaly Cardiac: Normal S1, S2; RRR; no murmurs, rubs, or gallops Lungs: Diffuse crackles bilaterally Abd: Soft, nontender, no hepatomegaly  Ext: No edema, pulses 2+ Musculoskeletal: No deformities, BUE and BLE strength normal and equal Skin: Warm and dry, no rashes   Neuro: Alert and oriented to person, place, time, and situation, CNII-XII grossly intact, no focal deficits  Psych: Normal mood and affect   Labs  High Sensitivity Troponin:   Recent Labs  Lab 03/22/21 1905 03/22/21 2130 03/23/21 0434  TROPONINIHS 1,268* 1,218* 1,247*     Cardiac EnzymesNo results for input(s): TROPONINI in the last 168 hours. No results for input(s): TROPIPOC in the last 168 hours.  Chemistry Recent  Labs  Lab 03/22/21 1905 03/23/21 0131 03/23/21 1518 03/24/21 0050 03/25/21 0043 03/25/21 0449 03/26/21 0022  NA 134* 137   < > 137 134*  --  137  K 2.8* 2.6*   < > 3.5 4.4  --  4.3  CL 94* 100   < > 100 104  --  103  CO2 27 27   < > 27 18*  --  24  GLUCOSE 425* 179*   < > 70 117*  --  123*  BUN 56* 48*   < > 45* 42*  --  39*  CREATININE 2.83* 2.64*   < > 2.33* 2.23*  --  2.06*  CALCIUM 9.4 9.1   < > 9.0 8.8*  --  9.0  PROT 6.3* 5.8*  --   --  5.3*  --   --   ALBUMIN 3.3* 3.0*  --   --  2.7*  --   --   AST 32 25  --   --  37  --   --   ALT 61* 55*  --   --  QUANTITY NOT SUFFICIENT, UNABLE TO PERFORM TEST  --   --   ALKPHOS 76 72  --   --  56 56  --   BILITOT 0.9 0.6  --   --  QUANTITY NOT SUFFICIENT, UNABLE TO PERFORM TEST 0.9  --   GFRNONAA 24* 26*   < > 30* 32*  --  35*  ANIONGAP 13 10   < > 10 12  --  10   < > = values in this interval not displayed.    Hematology Recent Labs  Lab 03/24/21 0050 03/25/21 0043 03/26/21 0022  WBC 15.1* 14.9* 13.9*  RBC 3.57* 3.87* 3.82*  HGB 10.1* 11.3* 10.8*  HCT 31.7* 33.6* 34.5*  MCV 88.8 86.8 90.3  MCH 28.3 29.2 28.3  MCHC 31.9 33.6 31.3  RDW 15.4 15.8* 15.9*  PLT 348 273 332   BNP Recent Labs  Lab 03/22/21 1244 03/22/21 1905  BNP  --  715.9*  PROBNP 821.0*  --     DDimer No results for input(s): DDIMER in the last 168 hours.   Radiology  ECHOCARDIOGRAM LIMITED  Result Date: 03/24/2021    ECHOCARDIOGRAM LIMITED REPORT   Patient Name:   Eugene [name] [facility] Date of Exam: 03/24/2021 Medical Rec #:  [id number]    Height:       65.0 in Accession #:    [id number]   Weight:       166.9 lb Date of Birth:  [birth year]   BSA:          1.832 m Patient Age:  64 years     BP:           101/64 mmHg Patient Gender: M            HR:           77 bpm. Exam Location:  Inpatient Procedure: Limited Echo, Cardiac Doppler, Color Doppler and Intracardiac            Opacification Agent Indications:    122-I22.9 Subsequent ST elevation (STEM) and non-ST  elevation                 (NSTEMI) myocardial infarction  History:        Patient has prior history of Echocardiogram examinations, most                 recent 01/27/2021. CHF, Previous Myocardial Infarction and CAD,                 TIA and Stroke; Risk Factors:Dyslipidemia, Diabetes and Sleep                 Apnea.  Sonographer:    Roseanna Rainbow RDCS Referring Phys: [id number] [facility]  Sonographer Comments: Technically difficult study due to poor echo windows. Image acquisition challenging due to patient body habitus. IMPRESSIONS  1. Left ventricular ejection fraction, by estimation, is 45%. The left ventricle has mildly decreased function. The left ventricle demonstrates regional wall motion abnormalities (suggestive of LAD and RCA disease).  2. Right ventricular systolic function is normal. Tricuspid regurgitation signal is inadequate for assessing PA pressure.  3. Trivial mitral valve regurgitation. No evidence of mitral stenosis.  4. The aortic valve is calcified. Aortic valve regurgitation is not visualized. Mild to moderate aortic valve sclerosis/calcification is present, without any evidence of aortic stenosis.  5. The inferior vena cava is normal in size with <50% respiratory variability, suggesting right atrial pressure of 8 mmHg. Comparison(s): A prior study was performed on 01/27/21. No significant change from prior study. Prior images reviewed side by side. FINDINGS  Left Ventricle: Left ventricular ejection fraction, by estimation, is 45%. The left ventricle has mildly decreased function. The left ventricle demonstrates regional wall motion abnormalities. Definity contrast agent was given IV to delineate the left ventricular endocardial borders.  LV Wall Scoring: The anterior wall, anterior septum, inferior wall, mid inferoseptal segment, and basal inferoseptal segment are hypokinetic. Right Ventricle: Right ventricular systolic function is normal. Tricuspid regurgitation signal is inadequate for  assessing PA pressure. Mitral Valve: Trivial mitral valve regurgitation. No evidence of mitral valve stenosis. Aortic Valve: The aortic valve is calcified. Aortic valve regurgitation is not visualized. Mild to moderate aortic valve sclerosis/calcification is present, without any evidence of aortic stenosis. Aortic valve mean gradient measures 9.0 mmHg. Aortic valve peak gradient measures 16.8 mmHg. Aortic valve area, by VTI measures 1.86 cm. Pulmonic Valve: The pulmonic valve was grossly normal. Venous: The inferior vena cava is normal in size with less than 50% respiratory variability, suggesting right atrial pressure of 8 mmHg. Additional Comments: A device lead is visualized. LEFT VENTRICLE PLAX 2D LVIDd:         4.15 cm LVIDs:         3.90 cm LV PW:         1.50 cm LV IVS:        1.00 cm LVOT diam:     1.90 cm LV SV:         76 LV SV Index:   41 LVOT Area:  2.84 cm  LV Volumes (MOD) LV vol d, MOD A2C: 117.0 ml LV vol d, MOD A4C: 108.0 ml LV vol s, MOD A2C: 69.3 ml LV vol s, MOD A4C: 62.1 ml LV SV MOD A2C:     47.7 ml LV SV MOD A4C:     108.0 ml LV SV MOD BP:      51.1 ml IVC IVC diam: 2.00 cm LEFT ATRIUM         Index LA diam:    4.00 cm 2.18 cm/m  AORTIC VALVE AV Area (Vmax):    1.88 cm AV Area (Vmean):   1.75 cm AV Area (VTI):     1.86 cm AV Vmax:           205.00 cm/s AV Vmean:          141.000 cm/s AV VTI:            0.409 m AV Peak Grad:      16.8 mmHg AV Mean Grad:      9.0 mmHg LVOT Vmax:         136.00 cm/s LVOT Vmean:        87.000 cm/s LVOT VTI:          0.268 m LVOT/AV VTI ratio: 0.66  AORTA Ao Root diam: 3.40 cm Ao Asc diam:  3.30 cm MITRAL VALVE MV Area (PHT): 4.89 cm     SHUNTS MV Decel Time: 155 msec     Systemic VTI:  0.27 m MV E velocity: 103.00 cm/s  Systemic Diam: 1.90 cm MV A velocity: 70.80 cm/s MV E/A ratio:  1.45 Rudean Haskell MD Electronically signed by Rudean Haskell MD Signature Date/Time: 03/24/2021/3:16:08 PM    Final     Cardiac Studies  TTE 03/24/2021  1.  Left ventricular ejection fraction, by estimation, is 45%. The left  ventricle has mildly decreased function. The left ventricle demonstrates  regional wall motion abnormalities (suggestive of LAD and RCA disease).   2. Right ventricular systolic function is normal. Tricuspid regurgitation  signal is inadequate for assessing PA pressure.   3. Trivial mitral valve regurgitation. No evidence of mitral stenosis.   4. The aortic valve is calcified. Aortic valve regurgitation is not  visualized. Mild to moderate aortic valve sclerosis/calcification is  present, without any evidence of aortic stenosis.   5. The inferior vena cava is normal in size with <50% respiratory  variability, suggesting right atrial pressure of 8 mmHg.   LHC 09/12/2020 Severe native coronary obstructive disease 80% distal left main stenosis, total occlusion of the LAD, ramus immediate vessel, high-grade circumflex stenoses, total occlusion of the proximal RCA.  There was a shunt to the distal RCA via left to right and right to right collateralization.   Patent LIMA graft supplying the mid LAD with diffuse disease in the LAD after insertion.   Supplying the OM 2 vessel with widely patent stent in the proximal portion of the SVG.  There is 95% in-stent restenosis in the native OM 2 vessel beyond the anastomosis.   Old occlusion of the vein graft which had supplied the ramus vessel.   Old occlusion of the vein graft which had supplied the PDA.   Difficult but ultimately successful intervention through the SVG to the distal OM 2 vessel requiring PTCA, [name] Cutting Balloon, and ultimate noncompliant balloon dilatation with a 95% stenosis being reduced to less than 20%.   LVEDP: 11 mmHg   RECOMMENDATION: DAPT probably indefinitely.  Since the patient developed  early in-stent restenosis on aspirin/Plavix, Brilinta was utilized for more aggressive antiplatelet therapy.  Medical therapy for concomitant CAD.  Optimal blood  pressure control, diabetes management, and aggressive lipid therapy with target LDL less than 70.  Patient Profile  Eugene [name] is a 65 y.o. male with CAD status post CABG (recent PCI of vein graft to OM 2 (patent LIMA to LAD, occluded vein graft RI/PDA), systolic heart failure EF 45% (mid range), CKD stage IV, prior stroke, idiopathic pulmonary fibrosis on home 2 L, VF arrest status post ICD who was admitted on 03/22/2021 with shortness of breath and pulmonary fibrosis exacerbation and AKI.  Cardiology was consulted for troponin elevation which has been attributed to demand ischemia in the setting of pulmonary fibrosis exacerbation.  Assessment & Plan   Non-STEMI, likely demand ischemia in the setting of pulmonary fibrosis exacerbation -Admitted with pulmonary fibrosis exacerbation.  Troponin was minimally elevated and flat.  This has been attributed to demand ischemia by the primary cardiology team last week and over the weekend. -He does have an extensive CAD history with in-stent restenosis of stents to vein grafts.  Recent intervention and a vein graft to an OM 2 branch. -No chest pain.  This is not a primary ACS.  He is on heparin but being bridged back to Coumadin.  He is on Plavix.  Apparently he could not tolerate Brilinta.  Effient would be not a good choice given his history of stroke. -Echo is unchanged -Would recommend is to continue home medications.  He is on Crestor.  He is on a beta-blocker.  He is on Plavix.  No further cardiac evaluation is indicated.  2.  CAD status post CABG with recent PCI to the vein graft -Left heart cath in February demonstrated patent LIMA to LAD, occluded vein graft to ramus, occluded vein graft to diagonal, severe ISR of vein graft to OM 2 that underwent PCI. -No angina.  Continue Plavix.  Did not tolerate Brilinta.  Effient not a good choice given prior history of stroke. -He is on Crestor.  Cannot afford PCSK9 inhibitors.  We will continue beta-blocker  no wheezing.  3.  Paroxysmal atrial fibrillation -On Coumadin. -On heparin drip.  We will continue with bridge. -Maintaining sinus rhythm.  Continue beta-blocker.  4.  Chronic systolic heart failure, EF 45% -Euvolemic on exam.  Continue metoprolol succinate.  No ACE/ARB/Arni/MRA given significant CKD stage IV. -Would resume Lasix 40 mg daily at discharge.  He does not appear volume overloaded. -Careful consideration of SGLT2 inhibitor as an outpatient.  5.  VF arrest status post ICD -Followed by EP in outpatient setting.  Has had therapies delivered.  Followed by Dr. Lovena Le.  No changes have been made.  Voiding amnio given significant lung disease. -Telemetry unremarkable here.  [facility] will sign off.   Medication Recommendations: As above Other recommendations (labs, testing, etc): None Follow up as an outpatient: We will arrange outpatient follow-up with Dr. Owens Shark in 4 to 6 weeks.  For questions or updates, please contact [place] Please consult [email or web address] for contact info under   Time Spent with Patient: I have spent a total of 35 minutes with patient reviewing hospital notes, telemetry, EKGs, labs and examining the patient as well as establishing an assessment and plan that was discussed with the patient.  > 50% of time was spent in direct patient care.    Signed, Addison Naegeli. Audie Box, MD, [place]  03/26/2021 7:57 AM

## 2021-03-26 NOTE — Progress Notes (Signed)
PT Cancellation Note  Patient Details Name: Eugene [name] MRN: [id number] DOB: 07-05-1956   Cancelled Treatment:    Reason Eval/Treat Not Completed: Other (comment) Pt reports discharging home in about 30 mins and would like to safe energy.  He has no questions for therapy.   Abran Richard, PT Acute Rehab Services Pager [phone] [facility] [place] 03/26/2021, 10:48 AM

## 2021-03-26 NOTE — Progress Notes (Addendum)
ANTICOAGULATION CONSULT NOTE  Pharmacy Consult for Heparin + warfarin Indication: chest pain/ACS  No Known Allergies  Patient Measurements: Height: 5\' 5"  (165.1 cm) Weight: 78.9 kg (173 lb 15.1 oz) IBW/kg (Calculated) : 61.5  Vital Signs: Temp: 97.5 F (36.4 C) (08/29 0823) Temp Source: Oral (08/29 0823) BP: 112/70 (08/29 0917) Pulse Rate: 73 (08/29 0917)  Labs: Recent Labs    03/24/21 0050 03/24/21 1457 03/25/21 0043 03/25/21 0449 03/25/21 1126 03/26/21 0022  HGB 10.1*  --  11.3*  --   --  10.8*  HCT 31.7*  --  33.6*  --   --  34.5*  PLT 348  --  273  --   --  332  LABPROT 21.2*  --   --  17.4*  --  17.6*  INR 1.8*  --   --  1.4*  --  1.4*  HEPARINUNFRC  --    < >  --  0.32 0.37 0.20*  CREATININE 2.33*  --  2.23*  --   --  2.06*   < > = values in this interval not displayed.     Estimated Creatinine Clearance: 34.6 mL/min (A) (by C-G formula based on SCr of 2.06 mg/dL (H)).  Medications:  sodium chloride     heparin 1,200 Units/hr (03/26/21 0743)    Assessment: 25 yom with a history of CAD, h/o CABG and PCI, h/o VF arrest s/p ICD implanted 2012, chronic combined HF. Patient presented to ED with SOB. Heparin infusion per pharmacy ordered for ACS. Plans noted for Echo  Patient is on warfarin prior to arrival. Last taken 8/24 @ 2030. Home dose 5mg  [id number]; 2.5mg  on Su.  As outpatient, plans were to start steroids with mycophenolate so bactrim was added for PCP prophylaxis. He has not started Bactrim yet.  Heparin level came back therapeutic at 0.4, on 1200 units/hr. No s/sx of bleeding or infusion issues.   INR down to 1.4 today - warfarin was restarted on 8/27. Hgb 10.8, plt 332. Plan to discharge on Bactrim 3 times/wk. Called and arranged for INR check on Thursday at noon. Talked with Hospitalist and no plans for bridge at discharge.   Goal of Therapy:  Heparin level 0.3-0.7 units/ml Monitor platelets by anticoagulation protocol: Yes   Plan:  -Stop  heparin infusion -No plans for bridge  -Will restart PTA warfarin dosing at 5 mg daily except 2.5 mg on Sunday -Plan for INR check on Thursday - talked with clinic and alerted them to likely interaction if goes out with Bactrim and warfarin  -At discharge, consider changing Septra to Dapsone for PCP prophylaxis to avoid drug interaction with Coumadin.  Eugene [name], PharmD, [place] Clinical Pharmacist  Phone: [phone] 03/26/2021 10:48 AM  Please check AMION for all [place] phone numbers After 10:00 PM, call [place] [phone]

## 2021-03-26 NOTE — Discharge Summary (Signed)
Physician Discharge Summary  Eugene [name]:[id number] DOB: 07/07/1956 DOA: 03/22/2021  PCP: [name], MD  Admit date: 03/22/2021 Discharge date: 03/26/2021  Admitted From: Home  Discharge disposition: Home with PT  Recommendations for Outpatient Follow-Up:   Follow up with your primary care provider in one week.  Check CBC, BMP, magnesium in the next visit Follow-up with [name] pulmonary as outpatient, as has been scheduled. Follow-up with your cardiologist as outpatient.  Discharge Diagnosis:   Principal Problem:   Acute exacerbation of idiopathic pulmonary fibrosis (HCC) Active Problems:   Chronic systolic CHF (congestive heart failure) (HCC)   Acute renal failure superimposed on stage 3b chronic kidney disease (HCC)   Leukocytosis   Elevated troponin   Lactic acidosis   Hypokalemia, inadequate intake   Type 2 diabetes mellitus with stage 3b chronic kidney disease, with long-term current use of insulin ([place])  Discharge Condition: Improved.  Diet recommendation: Low sodium, heart healthy.  carbohydrate-modified.    Wound care: None.  Code status: Full.   History of Present Illness:   65 year old male with past medical history of chronic idiopathic pulmonary fibrosis, chronic hypoxic respiratory failure (on 3lpm  at rest and 6lpm with exertion via [place] at baseline), ischemic cardiomyopathy with systolic congestive heart failure  (last echo 01/2021 EF 45-50% with G1DD), coronary artery disease (S/P CABG 2006, cath with DES to SVG and OM2 in 06/2020 and another NSTEMI S/P cath 08/2020),  V. fib arrest (2012 S/P ICD palcement), cryptogenic stroke 11/2017, hyperlipidemia, chronic kidney disease stage IIIb, insulin-dependent diabetes mellitus type 2, paroxysmal atrial fibrillation presented to hospital due to worsening shortness of breath and increasing oxygen demand from pulmonary office as well as increasing BUN and creatinine.  Patient does have dyspnea on exertion  and has been following up with [name] as outpatient.   He has been using up to 9 L of oxygen at home and is able to ambulate a little.  He did have a telemedicine visit on 8/22 and his prednisone was increased to 20 mg a day.  In the ED, chest x-ray was concerning for pulmonary fibrosis.  Troponin was elevated at 1268.  Patient received 125 mg of IV Solu-Medrol. Cardiology was consulted for elevated troponin and the patient was admitted to hospital.   Hospital Course:   Following conditions were addressed during hospitalization as listed below,  Acute exacerbation of idiopathic pulmonary fibrosis   Patient follows up with [name], pulmonary as outpatient.   Procalcitonin negative.  He was given IV steroids during hospitalization and will be continued on oral prednisone that was prescribed to him recently.  CT of the chest showed worsening pulmonary fibrosis.  Patient is on baseline oxygen requirement prior to discharge.   Blood cultures were negative in 4 days.  Patient will follow-up with pulmonary as outpatient which has already been scheduled.   Elevated troponin likely demand ischemia. Extensive cardiac history with CABG/AICD.  Patient was seen by cardiology.  She was initially on heparin drip.  Limited 2D echocardiogram with regional wall motion abnormality.  Cardiology has recommended antiplatelets and warfarin on discharge with outpatient cardiology follow-up.   Lactic acidosis Resolved     Chronic systolic CHF  Off IV fluids.  We will follow-up with cardiology as outpatient.     Acute kidney injury superimposed on stage 3b chronic kidney disease  Received some IV fluids.  Continue strict intake and output charting.  Daily weights.  Creatinine of 2.0 from 2.8  Leukocytosis Likely reactive from steroid usage.  Procalcitonin negative.  CRP 0.6 Unlikely to be infection.     Hypokalemia, Improved after replacement.  Latest potassium of 4.3    Type 2 diabetes mellitus  with stage 3b chronic kidney disease, with long-term current use of insulin   Continue Accu-Cheks, sliding scale insulin and basal insulin.  Hemoglobin A1c of 9.4.  Latest POC glucose of 95.   Disposition.  At this time, patient is stable for disposition home with outpatient PCP pulmonary follow-up.  Medical Consultants:   Cardiology  Procedures:    None Subjective:   Today, patient was seen and examined at bedside.  Patient feels better today.  Has less shortness of breath.  No chest pain, dizziness, lightheadedness.  Feels at baseline.  Discharge Exam:   Vitals:   03/26/21 0823 03/26/21 0917  BP: 101/69 112/70  Pulse: 66 73  Resp: 18 (!) 30  Temp: (!) 97.5 F (36.4 C)   SpO2: 99% 91%   Vitals:   03/25/21 2351 03/26/21 0310 03/26/21 0823 03/26/21 0917  BP: 116/81 108/63 101/69 112/70  Pulse: 71 61 66 73  Resp: (!) 24 (!) 26 18 (!) 30  Temp: 97.7 F (36.5 C) 97.6 F (36.4 C) (!) 97.5 F (36.4 C)   TempSrc: Oral Oral Oral   SpO2: 93% 93% 99% 91%  Weight:  78.9 kg    Height:       General: Alert awake, not in obvious distress, nasal cannula oxygen at 6 L/min HENT: pupils equally reacting to light,  No scleral pallor or icterus noted. Oral mucosa is moist.  Scab over the lower part of the neck Chest:  Clear breath sounds.  Diminished breath sounds bilaterally.  Basal crackles from history of interstitial fibrosis.  CVS: S1 &S2 heard. No murmur.  Regular rate and rhythm. Abdomen: Soft, nontender, nondistended.  Bowel sounds are heard.   Extremities: No cyanosis, clubbing or edema.  Peripheral pulses are palpable. Psych: Alert, awake and oriented, normal mood CNS:  No cranial nerve deficits.  Power equal in all extremities.   Skin: Warm and dry.  No rashes noted.  The results of significant diagnostics from this hospitalization (including imaging, microbiology, ancillary and laboratory) are listed below for reference.     Diagnostic Studies:   CT CHEST HIGH  RESOLUTION  Result Date: 03/23/2021 CLINICAL DATA:  65 year old male with history of increasing shortness of breath with exertion for the past 5-7 days. EXAM: CT CHEST WITHOUT CONTRAST TECHNIQUE: Multidetector CT imaging of the chest was performed following the standard protocol without intravenous contrast. High resolution imaging of the lungs, as well as inspiratory and expiratory imaging, was performed. COMPARISON:  Chest CT 09/09/2020. FINDINGS: Cardiovascular: Heart size is normal. There is no significant pericardial fluid, thickening or pericardial calcification. There is aortic atherosclerosis, as well as atherosclerosis of the great vessels of the mediastinum and the coronary arteries, including calcified atherosclerotic plaque in the left main, left anterior descending, left circumflex and right coronary arteries. Status post median sternotomy for CABG including LIMA to the LAD. Calcifications of the aortic valve and inferior mitral annulus. Left-sided pacemaker/AICD with lead tip terminating in the right ventricular apex. Mediastinum/Nodes: No pathologically enlarged mediastinal or hilar lymph nodes. Please note that accurate exclusion of hilar adenopathy is limited on noncontrast CT scans. Esophagus is unremarkable in appearance. No axillary lymphadenopathy. Lungs/Pleura: High-resolution images demonstrate marked progression of widespread areas of ground-glass attenuation, septal thickening, subpleural reticulation, parenchymal banding, traction bronchiectasis, peripheral bronchiolectasis, and  areas of developing honeycombing. These findings have a definitive craniocaudal gradient. Inspiratory and expiratory imaging is unremarkable. No acute consolidative airspace disease. No pleural effusions. In the posterior aspect of the right upper lobe (axial image 30 of series 3) there is a new 1.5 x 0.9 cm pleural based nodular density. No other definite suspicious appearing pulmonary nodules or masses are  confidently identified upon this background of advanced interstitial lung disease. Upper Abdomen: Aortic atherosclerosis. Calcified gallstones lying dependently in the gallbladder. Musculoskeletal: Median sternotomy wires. Electronic device in the subcutaneous fat of the medial lower left hemithorax, presumably an implantable loop recorder. There are no aggressive appearing lytic or blastic lesions noted in the visualized portions of the skeleton. IMPRESSION: 1. 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 2. Interval development of a pleural-based nodular density in the posterior aspect of the right upper lobe. This may simply represent an area of developing fibrosis, however, the possibility of neoplasm is not entirely excluded. Further evaluation with nonemergent PET-CT should be considered if clinically appropriate. 3. Aortic atherosclerosis, in addition to left main and 3 vessel coronary artery disease. Status post median sternotomy for CABG including LIMA to the LAD. 4. There are calcifications of the aortic valve and mitral annulus. Echocardiographic correlation for evaluation of potential valvular dysfunction may be warranted if clinically indicated. 5. Cholelithiasis. Aortic Atherosclerosis (ICD10-I70.0). Electronically Signed   By: Vinnie Langton M.D.   On: 03/23/2021 09:16   DG Chest Port 1 View  Result Date: 03/22/2021 CLINICAL DATA:  Worsening shortness of breath over the past week EXAM: PORTABLE CHEST 1 VIEW COMPARISON:  01/26/2021, chest CT from earlier in the same day. FINDINGS: Cardiac shadow remains enlarged. Postsurgical changes are noted. Defibrillator and loop recorder are seen and stable. Diffuse chronic fibrotic changes are identified bilaterally although some suggestion of alveolar density is noted bilaterally likely representing  some superimposed edema. No sizable effusion is noted. No bony abnormality is seen. IMPRESSION: Chronic fibrotic changes with mild superimposed edema. Electronically Signed   By: Inez Catalina M.D.   On: 03/22/2021 19:41     Labs:   Basic Metabolic Panel: Recent Labs  Lab 03/23/21 0131 03/23/21 1518 03/24/21 0050 03/25/21 0043 03/25/21 0449 03/26/21 0022  NA 137 136 137 134*  --  137  K 2.6* 3.7 3.5 4.4  --  4.3  CL 100 100 100 104  --  103  CO2 27 26 27  18*  --  24  GLUCOSE 179* 260* 70 117*  --  123*  BUN 48* 44* 45* 42*  --  39*  CREATININE 2.64* 2.54* 2.33* 2.23*  --  2.06*  CALCIUM 9.1 8.7* 9.0 8.8*  --  9.0  MG 2.1  --  2.0  --  1.8 1.9  PHOS  --   --   --   --  2.2*  --    GFR Estimated Creatinine Clearance: 34.6 mL/min (A) (by C-G formula based on SCr of 2.06 mg/dL (H)). Liver Function Tests: Recent Labs  Lab 03/22/21 1244 03/22/21 1905 03/23/21 0131 03/25/21 0043 03/25/21 0449  AST 28 32 25 37  --   ALT 53 61* 55* QUANTITY NOT SUFFICIENT, UNABLE TO PERFORM TEST  --   ALKPHOS 79 76 72 56 56  BILITOT 0.6 0.9 0.6 QUANTITY NOT SUFFICIENT, UNABLE TO PERFORM TEST 0.9  PROT 6.4 6.3* 5.8* 5.3*  --  ALBUMIN 3.5 3.3* 3.0* 2.7*  --    No results for input(s): LIPASE, AMYLASE in the last 168 hours. No results for input(s): AMMONIA in the last 168 hours. Coagulation profile Recent Labs  Lab 03/22/21 2210 03/23/21 0131 03/24/21 0050 03/25/21 0449 03/26/21 0022  INR 2.5* 2.5* 1.8* 1.4* 1.4*    CBC: Recent Labs  Lab 03/22/21 1244 03/22/21 1905 03/23/21 0131 03/24/21 0050 03/25/21 0043 03/26/21 0022  WBC 13.5* 13.4* 11.6* 15.1* 14.9* 13.9*  NEUTROABS 12.5* 11.3* 10.9*  --   --   --   HGB 11.3* 12.4* 11.5* 10.1* 11.3* 10.8*  HCT 35.0* 38.6* 34.9* 31.7* 33.6* 34.5*  MCV 87.2 88.5 86.4 88.8 86.8 90.3  PLT 353.0 356 328 348 273 332   Cardiac Enzymes: No results for input(s): CKTOTAL, CKMB, CKMBINDEX, TROPONINI in the last 168 hours. BNP: Invalid  input(s): POCBNP CBG: Recent Labs  Lab 03/25/21 1113 03/25/21 1609 03/25/21 2108 03/26/21 0440 03/26/21 0606  GLUCAP 124* 237* 170* 94 140*   D-Dimer No results for input(s): DDIMER in the last 72 hours. Hgb A1c No results for input(s): HGBA1C in the last 72 hours. Lipid Profile No results for input(s): CHOL, HDL, LDLCALC, TRIG, CHOLHDL, LDLDIRECT in the last 72 hours. Thyroid function studies No results for input(s): TSH, T4TOTAL, T3FREE, THYROIDAB in the last 72 hours.  Invalid input(s): FREET3 Anemia work up No results for input(s): VITAMINB12, FOLATE, FERRITIN, TIBC, IRON, RETICCTPCT in the last 72 hours. Microbiology Recent Results (from the past 240 hour(s))  Culture, blood (routine x 2)     Status: None (Preliminary result)   Collection Time: 03/22/21  7:50 PM   Specimen: BLOOD  Result Value Ref Range Status   Specimen Description BLOOD RIGHT ANTECUBITAL  Final   Special Requests   Final    BOTTLES DRAWN AEROBIC AND ANAEROBIC Blood Culture results may not be optimal due to an inadequate volume of blood received in culture bottles   Culture   Final    NO GROWTH 4 DAYS Performed at [facility], [place] [address] [place], [place] [address]    Report Status PENDING  Incomplete  Resp Panel by RT-PCR (Flu A&B, Covid) Nasopharyngeal Swab     Status: None   Collection Time: 03/22/21  7:57 PM   Specimen: Nasopharyngeal Swab; Nasopharyngeal(NP) swabs in vial transport medium  Result Value Ref Range Status   SARS Coronavirus 2 by RT PCR NEGATIVE NEGATIVE Final    Comment: (NOTE) SARS-CoV-2 target nucleic acids are NOT DETECTED.  The SARS-CoV-2 RNA is generally detectable in upper respiratory specimens during the acute phase of infection. The lowest concentration of SARS-CoV-2 viral copies this assay can detect is 138 copies/mL. A negative result does not preclude SARS-Cov-2 infection and should not be used as the sole basis for treatment or other patient management  decisions. A negative result may occur with  improper specimen collection/handling, submission of specimen other than nasopharyngeal swab, presence of viral mutation(s) within the areas targeted by this assay, and inadequate number of viral copies(<138 copies/mL). A negative result must be combined with clinical observations, patient history, and epidemiological information. The expected result is Negative.  Fact Sheet for Patients:  EntrepreneurPulse.com.au  Fact Sheet for Healthcare Providers:  IncredibleEmployment.be  This test is no t yet approved or cleared by the [place] FDA and  has been authorized for detection and/or diagnosis of SARS-CoV-2 by FDA under an Emergency Use Authorization (EUA). This EUA will remain  in effect (meaning this  test can be used) for the duration of the COVID-19 declaration under Section 564(b)(1) of the Act, 21 U.S.C.section 360bbb-3(b)(1), unless the authorization is terminated  or revoked sooner.       Influenza A by PCR NEGATIVE NEGATIVE Final   Influenza B by PCR NEGATIVE NEGATIVE Final    Comment: (NOTE) The Xpert Xpress SARS-CoV-2/FLU/RSV plus assay is intended as an aid in the diagnosis of influenza from Nasopharyngeal swab specimens and should not be used as a sole basis for treatment. Nasal washings and aspirates are unacceptable for Xpert Xpress SARS-CoV-2/FLU/RSV testing.  Fact Sheet for Patients: EntrepreneurPulse.com.au  Fact Sheet for Healthcare Providers: IncredibleEmployment.be  This test is not yet approved or cleared by the [place] FDA and has been authorized for detection and/or diagnosis of SARS-CoV-2 by FDA under an Emergency Use Authorization (EUA). This EUA will remain in effect (meaning this test can be used) for the duration of the COVID-19 declaration under Section 564(b)(1) of the Act, 21 U.S.C. section 360bbb-3(b)(1), unless the  authorization is terminated or revoked.  Performed at [facility], [place] [address], [name], [place] [address]   Culture, blood (routine x 2)     Status: None (Preliminary result)   Collection Time: 03/22/21  8:04 PM   Specimen: BLOOD  Result Value Ref Range Status   Specimen Description BLOOD LEFT ANTECUBITAL  Final   Special Requests   Final    BOTTLES DRAWN AEROBIC AND ANAEROBIC Blood Culture results may not be optimal due to an inadequate volume of blood received in culture bottles   Culture   Final    NO GROWTH 4 DAYS Performed at [facility], [place] [address] [place], [place] [address]    Report Status PENDING  Incomplete  MRSA Next Gen by PCR, Nasal     Status: None   Collection Time: 03/22/21 11:15 PM   Specimen: Nasal Mucosa; Nasal Swab  Result Value Ref Range Status   MRSA by PCR Next Gen NOT DETECTED NOT DETECTED Final    Comment: (NOTE) The GeneXpert MRSA Assay (FDA approved for NASAL specimens only), is one component of a comprehensive MRSA colonization surveillance program. It is not intended to diagnose MRSA infection nor to guide or monitor treatment for MRSA infections. Test performance is not FDA approved in patients less than 8 years old. Performed at [facility], [place] [address] [place], [place] [address]      Discharge Instructions:   Discharge Instructions     Call MD for:  temperature >100.4   Complete by: As directed    Diet - low sodium heart healthy   Complete by: As directed    Diet Carb Modified   Complete by: As directed    Discharge instructions   Complete by: As directed    Follow up with Dr Lynford Citizen, pulmonary as scheduled for pulmonary function and followup. Follow up with cardiology as has been scheduled. Do not overexert. Check coumadin levels  after discharge. Continue to take medications from home.   Increase activity slowly   Complete by: As directed       Allergies as of 03/26/2021   No Known Allergies       Medication List     TAKE these medications    benzonatate 200 MG capsule Commonly known as: TESSALON Take 1 capsule (200 mg total) by mouth 3 (three) times daily as needed for cough.   clopidogrel 75 MG tablet Commonly known as: PLAVIX Take 75  mg by mouth daily.   furosemide 40 MG tablet Commonly known as: LASIX Take 40 mg by mouth daily.   guaiFENesin-dextromethorphan 100-10 MG/5ML syrup Commonly known as: ROBITUSSIN DM Take 10 mLs by mouth every 6 (six) hours as needed for cough.   insulin aspart 100 UNIT/ML injection Commonly known as: NovoLOG Substitute to any brand approved.Before each meal 3 times a day, 140-199 - 2 units, 200-250 - 4 units, 251-299 - 6 units,  300-349 - 8 units,  350 or above 10 units. Dispense syringes and needles as needed, Ok to switch to PEN if approved. DX DM2, Code E11.65   NovoLOG FlexPen 100 UNIT/ML FlexPen Generic drug: insulin aspart Inject 0-10 Units into the skin 3 (three) times daily as needed for high blood sugar. Substitute to any brand approved.Before each meal 3 times a day, 140-199 - 2 units, 200-250 - 4 units, 251-299 - 6 units,  300-349 - 8 units,  350 or above 10 units. Dispense syringes and needles as needed, Ok to switch to PEN if approved. DX DM2, Code E11.65   insulin glargine 100 UNIT/ML Solostar Pen Commonly known as: LANTUS Inject 60 Units into the skin daily before breakfast.   Insulin Syringe-Needle U-100 25G X 1" 1 ML Misc For 4 times a day insulin SQ, 1 month supply. Diagnosis E11.65   metoprolol succinate 50 MG 24 hr tablet Commonly known as: TOPROL-XL Take 1 tablet (50 mg total) by mouth in the morning and at bedtime. Take with or immediately following a meal.   mycophenolate 500 MG tablet Commonly known as: CELLCEPT Take 1,000 mg by mouth 2 (two) times daily.   nitroGLYCERIN 0.4 MG SL tablet Commonly known as: NITROSTAT PLACE 1 TABLET UNDER THE TONGUE EVERY 5 MINUTES AS NEEDED FOR CHEST PAIN    OXYGEN Inhale into the lungs. At 9 liters   Ozempic (1 MG/DOSE) 4 MG/3ML Sopn Generic drug: Semaglutide (1 MG/DOSE) Inject 1 mg into the skin once a week.   pantoprazole 40 MG tablet Commonly known as: PROTONIX Take 40 mg by mouth daily.   predniSONE 10 MG tablet Commonly known as: DELTASONE 2 tabs daily for 2 weeks then 1 1/2 tab daily for 2 weeks then 1 tab daily What changed: Another medication with the same name was removed. Continue taking this medication, and follow the directions you see here.   ranolazine 500 MG 12 hr tablet Commonly known as: RANEXA Take 1 tablet (500 mg total) by mouth 2 (two) times daily. TAKE ONE TABLET BY MOUTH TWICE DAILY.PLEASE MAKE APPT WITH DR.HARDING FOR REFILLS.   rosuvastatin 40 MG tablet Commonly known as: CRESTOR Take 1 tablet (40 mg total) by mouth daily.   sulfamethoxazole-trimethoprim 800-160 MG tablet Commonly known as: BACTRIM DS Take 1 tablet by mouth 3 (three) times a week.   vitamin B-12 1000 MCG tablet Commonly known as: CYANOCOBALAMIN TAKE 1 TABLET (1,000 MCG TOTAL) BY MOUTH DAILY.   warfarin 5 MG tablet Commonly known as: COUMADIN Take 0.5-1 tablets (2.5-5 mg total) by mouth See admin instructions. Take 1 tablet (5 mg) daily except on Sun Take 1/2 tablet (2.5 mg)        Follow-up Information     Loel Dubonnet, NP Follow up on 04/30/2021.   Specialty: Cardiology Why: @8am  for hospital follow with Dr. Oralia Rud PA/NP Contact information: [place] [address] [phone]         [name], MD Follow up.   Specialty: Endocrinology Contact information: [place]  [place] [address] [phone]                  Time coordinating discharge: 39 minutes  Signed:  Makalynn Berwanger  [facility] 03/26/2021, 2:30 PM

## 2021-03-26 NOTE — Progress Notes (Signed)
   03/26/21 1050  Clinical Encounter Type  Visited With Patient  Visit Type Initial  Referral From Nurse  Consult/Referral To Chaplain   Chaplain responded to consult request for an Advanced Directive. This chaplain provided AD education. The patient stated he was being discharged today and will have the document notarized outside of the hospital.  This note was prepared by Jeanine Luz, M.Div..  For questions please contact by phone [phone].

## 2021-03-26 NOTE — Telephone Encounter (Signed)
Patient asking about results of CT he had on 03/22/21.

## 2021-03-26 NOTE — Consult Note (Signed)
   [facility] Inpatient Consult   03/26/2021  Eugene [name] Dec 17, 1955 [id number]  [facility] [employer] [ACO] Patient: [employer]   Patient is currently active with [facility] for chronic disease management services.  Patient has had contact attempts by a [facility] Coordinator.  Spoke with patient at the bedside.  Explained [facility] RN attempts to reach him.  Patient was on the phone states, "I'm on hold and been on hold for 20 minutes." Patient states he may have received messages and explained the purpose of follow up.  Patient express permission for ongoing telephonic contact.  Plan: Patient will receive a post hospital call and will be evaluated for assessments and disease process education.    Will contact Inpatient Transition Of Care [TOC] team member to make aware that [facility] following.   Of note, [facility] Care Management services does not replace or interfere with any services that are needed or arranged by inpatient [facility] care management team.  For additional questions or referrals please contact:  Natividad Brood, RN BSN [facility] Liaison  [phone] business mobile phone Toll free office [phone]  Fax number: [phone] [email or web address]

## 2021-03-26 NOTE — Progress Notes (Signed)
ANTICOAGULATION CONSULT NOTE Pharmacy Consult for Heparin + warfarin Indication: Atrial fibrillation  No Known Allergies  Patient Measurements: Height: 5\' 5"  (165.1 cm) Weight: 81.5 kg (179 lb 10.8 oz) IBW/kg (Calculated) : 61.5  Vital Signs: Temp: 97.7 F (36.5 C) (08/28 2351) Temp Source: Oral (08/28 2351) BP: 116/81 (08/28 2351) Pulse Rate: 71 (08/28 2351)  Labs: Recent Labs    03/23/21 0434 03/23/21 1518 03/24/21 0050 03/24/21 1457 03/25/21 0043 03/25/21 0449 03/25/21 1126 03/26/21 0022  HGB  --    < > 10.1*  --  11.3*  --   --  10.8*  HCT  --   --  31.7*  --  33.6*  --   --  34.5*  PLT  --   --  348  --  273  --   --  332  LABPROT  --   --  21.2*  --   --  17.4*  --  17.6*  INR  --   --  1.8*  --   --  1.4*  --  1.4*  HEPARINUNFRC  --   --   --    < >  --  0.32 0.37 0.20*  CREATININE  --    < > 2.33*  --  2.23*  --   --  2.06*  TROPONINIHS 1,247*  --   --   --   --   --   --   --    < > = values in this interval not displayed.     Estimated Creatinine Clearance: 35.1 mL/min (A) (by C-G formula based on SCr of 2.06 mg/dL (H)).  Assessment: 65 y.o. male admitted with SOB/ACS, h/o Afib and INR subtherapeutic, for anticoagulation  Goal of Therapy:  INR 2-3 Heparin level 0.3-0.7 units/ml Monitor platelets by anticoagulation protocol: Yes   Plan:  Coumadin 7.5 mg today Increase Heparin 1200 units/hr Check heparin level in 8 hours.  Phillis Knack, PharmD, BCPS

## 2021-03-27 ENCOUNTER — Telehealth: Payer: Self-pay | Admitting: Internal Medicine

## 2021-03-27 ENCOUNTER — Other Ambulatory Visit: Payer: Self-pay

## 2021-03-27 DIAGNOSIS — J849 Interstitial pulmonary disease, unspecified: Secondary | ICD-10-CM

## 2021-03-27 LAB — CULTURE, BLOOD (ROUTINE X 2)
Culture: NO GROWTH
Culture: NO GROWTH

## 2021-03-27 NOTE — Telephone Encounter (Signed)
Patient is aware of results and voiced his understanding.  Nothing further needed at this time.

## 2021-03-27 NOTE — Patient Instructions (Signed)
Goals Addressed               This Visit's Progress     Monitor and Manage My Blood Sugar-Diabetes Type 2   On track     Barriers: Health Behaviors Timeframe:  Long-Range Goal Priority:  High Start Date:      02/06/21                       Expected End Date:    07/28/21                   Follow Up Date 04/27/21 - check blood sugar at prescribed times - check blood sugar if I feel it is too high or too low - take the blood sugar log to all doctor visits    Why is this important?   Checking your blood sugar at home helps to keep it from getting very high or very low.  Writing the results in a diary or log helps the doctor know how to care for you.  Your blood sugar log should have the time, date and the results.  Also, write down the amount of insulin or other medicine that you take.  Other information, like what you ate, exercise done and how you were feeling, will also be helpful.     Notes:  02/06/21 Patient last A1c 9. Patient reports sugar ranging 91-110.   Diabetes Management Discussed: Medication adherence Reviewed importance of limiting carbs such as rice, potatoes, breads, and pastas. Also discussed limiting sweets and sugary drinks.  Discussed importance of portion control.  Also discussed importance of annual exams, foot exams, and eye exams.   02/22/21 Patient reports last sugar 111. Keep up the great work.  Reviewed diabetes management.   03/08/21 Patient reports sugars doing good.  No recent numbers reported.  Encouraged to continue diabetes management.  03/27/21 Diabetes management going well per patient despite other health issues.  Blood sugar 160 today.  Encouraged continued diabetes management.      Patient Stated " Gain my strength back" (pt-stated)   On track     Barriers: Health Behaviors Timeframe:  Short-Term Goal Priority:  High Start Date:   02/06/21                          Expected End Date:    05/28/21  Follow up: 04/27/21 Patient want to gain his  strength back.  Patient working with PT for strengthening a couple of times a week. Patient remaining active around the house. Patient using incentive spirometer to increase respiratory capacity.   02/22/21 Patient continues to work with PT at home for strengthening.   Patient continues using incentive  spirometer. Patient using oxygen at 3 liters.  Patient oxygen saturation anywhere from 58-95%. Patient reports oxygen saturation recovers quickly. Patient has appointment with pulmonology coming up next week.                 03/08/21 Patient reports he saw the pulmonologist and PFT test ordered. Oxygen levels continue to drop with activity in the 80's.    Return visit scheduled in 2 weeks per patient. 03/27/21 Patient recently hospitalized for pulmonary fibrosis. Patient using 2-9 liters of oxygen to keep saturations up.  Patient managed with antibiotic and prednisone for now. Patient to see pulmonologist next month.  Continues with home health services.        Track and Manage Symptoms-Heart Failure (  pt-stated)   On track     Barriers: Health Behaviors Timeframe:  Long-Range Goal Priority:  High Start Date:      02/06/21                       Expected End Date:      07/28/21                 Follow Up Date 04/27/21  - eat more whole grains, fruits and vegetables, lean meats and healthy fats - follow rescue plan if symptoms flare-up - know when to call the doctor    Why is this important?   You will be able to handle your symptoms better if you keep track of them.  Making some simple changes to your lifestyle will help.  Eating healthy is one thing you can do to take good care of yourself.    Notes:  02/06/21 Patient weight 165.7 lbs today. Patient reports having permission from MD to take extra lasix if weight increases/swelling to feet and legs.   Heart Failure Management Discussed Please weight daily or as ordered by your doctor Report to your doctor weight gain of 2 -3 pounds in a day or  5 pounds in a week Limit salt intake Monitor for shortness of breath, swelling of feet, ankles or abdomen and weight gain. Never use the saltshaker.  Read all food labels and avoid canned, processed, and pickled foods.   Follow your doctor's recommendations for daily salt intake. 02/22/21 Patient reports weight 161 lbs.  No extra lasix dosages.  Recent visit with cardiologist went good.  No changes.  Reviewed heart failure management.  03/08/21 Patient weight 163 lbs. No swelling.  Discussed continued heart failure management.   03/27/21 Patient weighs daily. Weight 166 lbs reported.  Discussed continued heart failure management.

## 2021-03-27 NOTE — Telephone Encounter (Signed)
Pt returned call for CT & asked to be reached at [phone]

## 2021-03-27 NOTE — Telephone Encounter (Signed)
LMTCB

## 2021-03-27 NOTE — Telephone Encounter (Signed)
Recent hospitalization  HRCT chest shows worsen ILD and lung nodule .  Will discuss in detail at ov with [name] on 04/11/21.  Cont w/ hospital discharge recs   Please contact office for sooner follow up if symptoms do not improve or worsen or seek emergency care

## 2021-03-27 NOTE — Patient Outreach (Signed)
[facility]) Care Management  [place]  03/27/2021   Eugene [name] Dec 17, 1955 [id number]  Subjective: Telephone call to patient for follow up.  Recent hospitalization. Patient reports doing okay.  Follow up appointments scheduled.  Discussed heart failure management.  No concerns.    Objective:   Encounter Medications:  Outpatient Encounter Medications as of 03/27/2021  Medication Sig Note   benzonatate (TESSALON) 200 MG capsule Take 1 capsule (200 mg total) by mouth 3 (three) times daily as needed for cough. 03/22/2021: Hasn't started.   clopidogrel (PLAVIX) 75 MG tablet Take 75 mg by mouth daily.    furosemide (LASIX) 40 MG tablet Take 40 mg by mouth daily.    guaiFENesin-dextromethorphan (ROBITUSSIN DM) 100-10 MG/5ML syrup Take 10 mLs by mouth every 6 (six) hours as needed for cough. 03/22/2021: Need Refills   insulin aspart (NOVOLOG) 100 UNIT/ML injection Substitute to any brand approved.Before each meal 3 times a day, 140-199 - 2 units, 200-250 - 4 units, 251-299 - 6 units,  300-349 - 8 units,  350 or above 10 units. Dispense syringes and needles as needed, Ok to switch to PEN if approved. DX DM2, Code E11.65 (Patient taking differently: Substitute to any brand approved.Before each meal 3 times a day, 140-199 - 2 units, 200-250 - 4 units, 251-299 - 6 units,  300-349 - 8 units,  350 or above 10 units. Dispense syringes and needles as needed, Ok to switch to PEN if approved. DX DM2, Code E11.65)    insulin glargine (LANTUS) 100 UNIT/ML Solostar Pen Inject 60 Units into the skin daily before breakfast.    Insulin Syringe-Needle U-100 25G X 1" 1 ML MISC For 4 times a day insulin SQ, 1 month supply. Diagnosis E11.65    metoprolol succinate (TOPROL-XL) 50 MG 24 hr tablet Take 1 tablet (50 mg total) by mouth in the morning and at bedtime. Take with or immediately following a meal.    nitroGLYCERIN (NITROSTAT) 0.4 MG SL tablet PLACE 1 TABLET UNDER THE TONGUE EVERY 5  MINUTES AS NEEDED FOR CHEST PAIN    NOVOLOG FLEXPEN 100 UNIT/ML FlexPen Inject 0-10 Units into the skin 3 (three) times daily as needed for high blood sugar. Substitute to any brand approved.Before each meal 3 times a day, 140-199 - 2 units, 200-250 - 4 units, 251-299 - 6 units,  300-349 - 8 units,  350 or above 10 units. Dispense syringes and needles as needed, Ok to switch to PEN if approved. DX DM2, Code E11.65    OXYGEN Inhale into the lungs. At 9 liters    OZEMPIC, 1 MG/DOSE, 4 MG/3ML SOPN Inject 1 mg into the skin once a week.    pantoprazole (PROTONIX) 40 MG tablet Take 40 mg by mouth daily.    predniSONE (DELTASONE) 10 MG tablet 2 tabs daily for 2 weeks then 1 1/2 tab daily for 2 weeks then 1 tab daily    ranolazine (RANEXA) 500 MG 12 hr tablet Take 1 tablet (500 mg total) by mouth 2 (two) times daily. TAKE ONE TABLET BY MOUTH TWICE DAILY.PLEASE MAKE APPT WITH DR.HARDING FOR REFILLS.    rosuvastatin (CRESTOR) 40 MG tablet Take 1 tablet (40 mg total) by mouth daily.    sulfamethoxazole-trimethoprim (BACTRIM DS) 800-160 MG tablet Take 1 tablet by mouth 3 (three) times a week. 03/22/2021: Hasn't started.   vitamin B-12 (CYANOCOBALAMIN) 1000 MCG tablet TAKE 1 TABLET (1,000 MCG TOTAL) BY MOUTH DAILY.    warfarin (COUMADIN) 5 MG tablet  Take 0.5-1 tablets (2.5-5 mg total) by mouth See admin instructions. Take 1 tablet (5 mg) daily except on Sun Take 1/2 tablet (2.5 mg)    mycophenolate (CELLCEPT) 500 MG tablet Take 1,000 mg by mouth 2 (two) times daily.    No facility-administered encounter medications on file as of 03/27/2021.    Functional Status:  In your present state of health, do you have any difficulty performing the following activities: 03/23/2021 02/06/2021  Hearing? N N  Vision? N N  Difficulty concentrating or making decisions? N N  Walking or climbing stairs? Y N  Dressing or bathing? Y N  Doing errands, shopping? Y N  Preparing Food and eating ? - N  Using the Toilet? - N  In the  past six months, have you accidently leaked urine? - N  Do you have problems with loss of bowel control? - N  Managing your Medications? - N  Managing your Finances? - N  Housekeeping or managing your Housekeeping? - N  Some recent data might be hidden    Fall/Depression Screening: Fall Risk  02/06/2021  Falls in the past year? 1  Number falls in past yr: 1  Injury with Fall? 0  Risk for fall due to : History of fall(s)  Follow up Falls prevention discussed   PHQ 2/9 Scores 02/06/2021  PHQ - 2 Score 0    Assessment:   Care Plan Care Plan : General Plan of Care (Adult)  Updates made by Jon Billings, RN since 03/27/2021 12:00 AM     Problem: Health Promotion or Disease Self-Management (General Plan of Care)      Goal: Self-Management Plan Developed as evidenced by no acute exacerbation of  pulmonary Fibrosis   Start Date: 03/27/2021  Expected End Date: 07/28/2021  Priority: High  Note:   Evidence-based guidance:  Review biopsychosocial determinants of health screens.  Determine level of modifiable health risk.  Assess level of patient activation, level of readiness, importance and confidence to make changes.  Evoke change talk using open-ended questions, pros and cons, as well as looking forward.  Identify areas where behavior change may lead to improved health.  Partner with patient to develop a robust self-management plan that includes lifestyle factors, such as weight loss, exercise and healthy nutrition, as well as goals specific to disease risks.  Support patient and family/caregiver active participation in decision-making and self-management plan.  Implement additional goals and interventions based on identified risk factors to reduce health risk.  Facilitate advance care planning.  Review need for preventive screening based on age, sex, family history and health history.   Notes:     Task: Mutually Develop and Royce Macadamia Achievement of Patient Goals   Due Date:  07/28/2021  Priority: Routine  Responsible User: Jon Billings, RN  Note:   Care Management Activities:    - collaboration with team encouraged - questions answered - reassurance provided    Notes: 03/27/21 Patient with diagnosis of pulmonary fibrosis.  Patient with recent hospitalization due to increasing shortness of breath.  Patient using oxygen continuous with liter flow from 2-9 liters based on saturations and shortness of breath.  Has follow up with pulmonology scheduled.  Patient also taking oral prednisone for management.  Patient advised to eat well, try to stay active, rest when needed, and follow physician recommendations.    Care Plan : Heart Failure (Adult)  Updates made by Jon Billings, RN since 03/27/2021 12:00 AM     Problem: Symptom Exacerbation (Heart Failure)  Long-Range Goal: Symptom Exacerbation Prevented or Minimized as evidenced by no heart failure exacerbation.   Start Date: 02/06/2021  Expected End Date: 07/28/2021  Recent Progress: On track  Priority: High  Note:   Evidence-based guidance:  Establish a mutually-agreed-upon early intervention process to communicate with primary care provider when signs/symptoms worsen.  Notes:  02/06/21 Patient reports managing well since being home.  Weight 165.7 lbs today.   02/22/21 Patient doing well with heart failure management. Recent appointment with cardiology.  Weight 161 lbs.    03/08/21 Patient weighs daily. Today's weight 163 lbs.  03/27/21 Patient weighs daily.  Discussed heart failure management.  Today's reported weight 166 lbs    Task: Identify and Minimize Risk of Heart Failure Exacerbation   Due Date: 07/28/2021  Priority: Routine  Responsible User: Jon Billings, RN  Note:   Care Management Activities:    - healthy lifestyle promoted - rescue (action) plan developed - self-awareness of signs/symptoms of worsening disease encouraged    Notes: 02/06/21 Patient weight 165.7 lbs today. Patient  reports having permission from MD to take extra lasix if weight increases/swelling to feet and legs.   Heart Failure Management Discussed Please weight daily or as ordered by your doctor Report to your doctor weight gain of 2 -3 pounds in a day or 5 pounds in a week Limit salt intake Monitor for shortness of breath, swelling of feet, ankles or abdomen and weight gain. Never use the saltshaker.  Read all food labels and avoid canned, processed, and pickled foods.   Follow your doctor's recommendations for daily salt intake. 02/22/21 Patient continues with heart failure management. Patient proudly states his weight is 161 lbs.  Encouraged to keep up the great work.   03/08/21 Patient weighs daily. Today's weight 163 lbs. Discussed continued heart failure management.   03/27/21 Patient weighs daily.  Discussed heart failure management.  Today's reported weight 166 lbs    Care Plan : Diabetes Type 2 (Adult)  Updates made by Jon Billings, RN since 03/27/2021 12:00 AM     Problem: Glycemic Management (Diabetes, Type 2)      Long-Range Goal: Glycemic Management Optimized as evidenced by A1c lowering less than 8.5   Start Date: 02/06/2021  Expected End Date: 07/28/2021  This Visit's Progress: On track  Recent Progress: On track  Priority: High  Note:   Evidence-based guidance:  Anticipate use of antihyperglycemic with or without insulin and periodic adjustments; consider active involvement of pharmacist.   Compare self-reported symptoms of hypo or hyperglycemia to blood glucose levels, diet and fluid intake, current medications, psychosocial and physiologic stressors, change in activity and barriers to care adherence.  Promote self-monitoring of blood glucose levels.  Assess and address barriers to management plan, such as food insecurity, age, developmental ability, depression, anxiety, fear of hypoglycemia or weight gain, as well as medication cost, side effects and complicated regimen.    Notes:  Patient A1c 9. Patient sugars ranging 91-110. Discussed diabetes diet of low carbohydrates.   02/22/21 Patient reports sugars lower with last report of 111.  Discussed limiting sugary drinks, rice, potatoes, pasta, and breads.   03/08/21 Patient reports blood sugar doing good.  Patient did not report numbers. Encouraged continued diabetes management. 03/27/21 Patient reports blood sugar doing good despite other issues.  Blood sugar 160 today.  Encouraged continued diabetes management.    Task: Alleviate Barriers to Glycemic Management   Due Date: 07/28/2021  Priority: Routine  Responsible User: Jon Billings, RN  Note:  Care Management Activities:    - blood glucose monitoring encouraged - blood glucose readings reviewed    Notes 02/06/21 Patient last A1c 9. Patient reports sugar ranging 91-110.   Diabetes Management Discussed: Medication adherence Reviewed importance of limiting carbs such as rice, potatoes, breads, and pastas. Also discussed limiting sweets and sugary drinks.  Discussed importance of portion control.  Also discussed importance of annual exams, foot exams, and eye exams.   02/22/21 Last blood sugar 111 and patient pleased with sugars.  Reviewed diabetes management.   03/08/21 Patient reports blood sugar doing good.  Patient did not report numbers. Encouraged continued diabetes management. 03/27/21 Patient reports blood sugar doing good despite other issues.  Blood sugar 160 today.  Encouraged continued diabetes management.      Goals Addressed               This Visit's Progress     Monitor and Manage My Blood Sugar-Diabetes Type 2   On track     Barriers: Health Behaviors Timeframe:  Long-Range Goal Priority:  High Start Date:      02/06/21                       Expected End Date:    07/28/21                   Follow Up Date 04/27/21 - check blood sugar at prescribed times - check blood sugar if I feel it is too high or too low - take the blood  sugar log to all doctor visits    Why is this important?   Checking your blood sugar at home helps to keep it from getting very high or very low.  Writing the results in a diary or log helps the doctor know how to care for you.  Your blood sugar log should have the time, date and the results.  Also, write down the amount of insulin or other medicine that you take.  Other information, like what you ate, exercise done and how you were feeling, will also be helpful.     Notes:  02/06/21 Patient last A1c 9. Patient reports sugar ranging 91-110.   Diabetes Management Discussed: Medication adherence Reviewed importance of limiting carbs such as rice, potatoes, breads, and pastas. Also discussed limiting sweets and sugary drinks.  Discussed importance of portion control.  Also discussed importance of annual exams, foot exams, and eye exams.   02/22/21 Patient reports last sugar 111. Keep up the great work.  Reviewed diabetes management.   03/08/21 Patient reports sugars doing good.  No recent numbers reported.  Encouraged to continue diabetes management.  03/27/21 Diabetes management going well per patient despite other health issues.  Blood sugar 160 today.  Encouraged continued diabetes management.      Patient Stated " Gain my strength back" (pt-stated)   On track     Barriers: Health Behaviors Timeframe:  Short-Term Goal Priority:  High Start Date:   02/06/21                          Expected End Date:    05/28/21  Follow up: 04/27/21 Patient want to gain his strength back.  Patient working with PT for strengthening a couple of times a week. Patient remaining active around the house. Patient using incentive spirometer to increase respiratory capacity.   02/22/21 Patient continues to work with PT at home for strengthening.  Patient continues using incentive  spirometer. Patient using oxygen at 3 liters.  Patient oxygen saturation anywhere from 58-95%. Patient reports oxygen saturation recovers  quickly. Patient has appointment with pulmonology coming up next week.                 03/08/21 Patient reports he saw the pulmonologist and PFT test ordered. Oxygen levels continue to drop with activity in the 80's.    Return visit scheduled in 2 weeks per patient. 03/27/21 Patient recently hospitalized for pulmonary fibrosis. Patient using 2-9 liters of oxygen to keep saturations up.  Patient managed with antibiotic and prednisone for now. Patient to see pulmonologist next month.  Continues with home health services.        Track and Manage Symptoms-Heart Failure (pt-stated)   On track     Barriers: Health Behaviors Timeframe:  Long-Range Goal Priority:  High Start Date:      02/06/21                       Expected End Date:      07/28/21                 Follow Up Date 04/27/21  - eat more whole grains, fruits and vegetables, lean meats and healthy fats - follow rescue plan if symptoms flare-up - know when to call the doctor    Why is this important?   You will be able to handle your symptoms better if you keep track of them.  Making some simple changes to your lifestyle will help.  Eating healthy is one thing you can do to take good care of yourself.    Notes:  02/06/21 Patient weight 165.7 lbs today. Patient reports having permission from MD to take extra lasix if weight increases/swelling to feet and legs.   Heart Failure Management Discussed Please weight daily or as ordered by your doctor Report to your doctor weight gain of 2 -3 pounds in a day or 5 pounds in a week Limit salt intake Monitor for shortness of breath, swelling of feet, ankles or abdomen and weight gain. Never use the saltshaker.  Read all food labels and avoid canned, processed, and pickled foods.   Follow your doctor's recommendations for daily salt intake. 02/22/21 Patient reports weight 161 lbs.  No extra lasix dosages.  Recent visit with cardiologist went good.  No changes.  Reviewed heart failure management.   03/08/21 Patient weight 163 lbs. No swelling.  Discussed continued heart failure management.   03/27/21 Patient weighs daily. Weight 166 lbs reported.  Discussed continued heart failure management.         Plan:  Follow-up: Patient agrees to Care Plan and Follow-up. Follow-up in 2-3 week(s)  [name], RN, MSN [place] [facility] Care Management Coordinator Direct Line [phone] Cell [phone] Toll Free: [phone]  Fax: [phone]

## 2021-03-27 NOTE — Telephone Encounter (Signed)
Spoke to patient, who is requesting humidifier for POC. C/o dry nose and throat.   [name], please advise. Thanks

## 2021-03-28 ENCOUNTER — Telehealth: Payer: Self-pay | Admitting: Internal Medicine

## 2021-03-28 NOTE — Telephone Encounter (Signed)
Spoke with pt wife okay per DPR.  She expressed that she was hoping to speak with Dr. Gasper Sells in regards to a conversation he had with pt.  Per wife pt reports that MD got down on his knee and told pt that he was on his end game and there is nothing that he could do.  Pt took this as he may not have much time left to live.  I informed wife that I would discuss this matter with MD tomorrow upon his return to the office and give her a call back.  I asked wife how pt is doing currently.  She reports that when pt gets up to ambulate his Oxygen level drops down to the 70's and he is currently on 9L of oxygen.  I again informed wife to expect a return call tomorrow.

## 2021-03-28 NOTE — Telephone Encounter (Signed)
Sure this is fine. thanks

## 2021-03-28 NOTE — Telephone Encounter (Signed)
Pt's wife states she would like a return call so she get clarity from a conversation when pt was in the hospital. Please advise pt's wife

## 2021-03-28 NOTE — Telephone Encounter (Signed)
Called and spoke with Patient.  Patient made aware of humidifier order for POC. DME order placed.  Nothing further at this time.

## 2021-03-29 ENCOUNTER — Encounter (HOSPITAL_COMMUNITY): Payer: Self-pay | Admitting: *Deleted

## 2021-03-29 ENCOUNTER — Emergency Department (HOSPITAL_COMMUNITY): Payer: Medicare HMO

## 2021-03-29 ENCOUNTER — Encounter: Payer: Self-pay | Admitting: Internal Medicine

## 2021-03-29 ENCOUNTER — Other Ambulatory Visit: Payer: Self-pay

## 2021-03-29 ENCOUNTER — Inpatient Hospital Stay (HOSPITAL_COMMUNITY)
Admission: EM | Admit: 2021-03-29 | Discharge: 2021-04-28 | DRG: 196 | Disposition: E | Payer: Medicare HMO | Attending: Internal Medicine | Admitting: Internal Medicine

## 2021-03-29 DIAGNOSIS — Z20822 Contact with and (suspected) exposure to covid-19: Secondary | ICD-10-CM | POA: Diagnosis not present

## 2021-03-29 DIAGNOSIS — E1122 Type 2 diabetes mellitus with diabetic chronic kidney disease: Secondary | ICD-10-CM | POA: Diagnosis present

## 2021-03-29 DIAGNOSIS — S0990XA Unspecified injury of head, initial encounter: Secondary | ICD-10-CM | POA: Diagnosis not present

## 2021-03-29 DIAGNOSIS — R54 Age-related physical debility: Secondary | ICD-10-CM | POA: Diagnosis present

## 2021-03-29 DIAGNOSIS — I130 Hypertensive heart and chronic kidney disease with heart failure and stage 1 through stage 4 chronic kidney disease, or unspecified chronic kidney disease: Secondary | ICD-10-CM | POA: Diagnosis not present

## 2021-03-29 DIAGNOSIS — F419 Anxiety disorder, unspecified: Secondary | ICD-10-CM | POA: Diagnosis present

## 2021-03-29 DIAGNOSIS — E11649 Type 2 diabetes mellitus with hypoglycemia without coma: Secondary | ICD-10-CM | POA: Diagnosis not present

## 2021-03-29 DIAGNOSIS — G7249 Other inflammatory and immune myopathies, not elsewhere classified: Secondary | ICD-10-CM | POA: Diagnosis present

## 2021-03-29 DIAGNOSIS — J8410 Pulmonary fibrosis, unspecified: Secondary | ICD-10-CM | POA: Diagnosis not present

## 2021-03-29 DIAGNOSIS — E785 Hyperlipidemia, unspecified: Secondary | ICD-10-CM | POA: Diagnosis present

## 2021-03-29 DIAGNOSIS — N1832 Chronic kidney disease, stage 3b: Secondary | ICD-10-CM | POA: Diagnosis present

## 2021-03-29 DIAGNOSIS — I5042 Chronic combined systolic (congestive) and diastolic (congestive) heart failure: Secondary | ICD-10-CM | POA: Diagnosis present

## 2021-03-29 DIAGNOSIS — W19XXXA Unspecified fall, initial encounter: Secondary | ICD-10-CM | POA: Diagnosis not present

## 2021-03-29 DIAGNOSIS — I10 Essential (primary) hypertension: Secondary | ICD-10-CM | POA: Diagnosis present

## 2021-03-29 DIAGNOSIS — W06XXXA Fall from bed, initial encounter: Secondary | ICD-10-CM | POA: Diagnosis present

## 2021-03-29 DIAGNOSIS — Z7902 Long term (current) use of antithrombotics/antiplatelets: Secondary | ICD-10-CM | POA: Diagnosis not present

## 2021-03-29 DIAGNOSIS — Z7189 Other specified counseling: Secondary | ICD-10-CM | POA: Diagnosis not present

## 2021-03-29 DIAGNOSIS — Z9981 Dependence on supplemental oxygen: Secondary | ICD-10-CM

## 2021-03-29 DIAGNOSIS — Z951 Presence of aortocoronary bypass graft: Secondary | ICD-10-CM

## 2021-03-29 DIAGNOSIS — I2510 Atherosclerotic heart disease of native coronary artery without angina pectoris: Secondary | ICD-10-CM | POA: Diagnosis present

## 2021-03-29 DIAGNOSIS — Z66 Do not resuscitate: Secondary | ICD-10-CM | POA: Diagnosis present

## 2021-03-29 DIAGNOSIS — J479 Bronchiectasis, uncomplicated: Secondary | ICD-10-CM | POA: Diagnosis present

## 2021-03-29 DIAGNOSIS — Z794 Long term (current) use of insulin: Secondary | ICD-10-CM | POA: Diagnosis not present

## 2021-03-29 DIAGNOSIS — J9621 Acute and chronic respiratory failure with hypoxia: Secondary | ICD-10-CM | POA: Diagnosis not present

## 2021-03-29 DIAGNOSIS — Z515 Encounter for palliative care: Secondary | ICD-10-CM

## 2021-03-29 DIAGNOSIS — R0602 Shortness of breath: Secondary | ICD-10-CM | POA: Diagnosis not present

## 2021-03-29 DIAGNOSIS — Z95810 Presence of automatic (implantable) cardiac defibrillator: Secondary | ICD-10-CM | POA: Diagnosis not present

## 2021-03-29 DIAGNOSIS — S069X9A Unspecified intracranial injury with loss of consciousness of unspecified duration, initial encounter: Secondary | ICD-10-CM | POA: Diagnosis present

## 2021-03-29 DIAGNOSIS — J984 Other disorders of lung: Secondary | ICD-10-CM | POA: Diagnosis present

## 2021-03-29 DIAGNOSIS — M47814 Spondylosis without myelopathy or radiculopathy, thoracic region: Secondary | ICD-10-CM | POA: Diagnosis not present

## 2021-03-29 DIAGNOSIS — E119 Type 2 diabetes mellitus without complications: Secondary | ICD-10-CM | POA: Insufficient documentation

## 2021-03-29 DIAGNOSIS — L8990 Pressure ulcer of unspecified site, unspecified stage: Secondary | ICD-10-CM | POA: Insufficient documentation

## 2021-03-29 DIAGNOSIS — R55 Syncope and collapse: Secondary | ICD-10-CM | POA: Diagnosis not present

## 2021-03-29 DIAGNOSIS — J8489 Other specified interstitial pulmonary diseases: Secondary | ICD-10-CM | POA: Diagnosis not present

## 2021-03-29 DIAGNOSIS — Z7901 Long term (current) use of anticoagulants: Secondary | ICD-10-CM

## 2021-03-29 DIAGNOSIS — R0902 Hypoxemia: Secondary | ICD-10-CM | POA: Diagnosis not present

## 2021-03-29 DIAGNOSIS — Z79899 Other long term (current) drug therapy: Secondary | ICD-10-CM

## 2021-03-29 DIAGNOSIS — J849 Interstitial pulmonary disease, unspecified: Secondary | ICD-10-CM | POA: Diagnosis present

## 2021-03-29 DIAGNOSIS — I517 Cardiomegaly: Secondary | ICD-10-CM | POA: Diagnosis not present

## 2021-03-29 DIAGNOSIS — Z6827 Body mass index (BMI) 27.0-27.9, adult: Secondary | ICD-10-CM

## 2021-03-29 DIAGNOSIS — R000 Tachycardia, unspecified: Secondary | ICD-10-CM | POA: Diagnosis present

## 2021-03-29 DIAGNOSIS — Z789 Other specified health status: Secondary | ICD-10-CM | POA: Diagnosis not present

## 2021-03-29 DIAGNOSIS — J84112 Idiopathic pulmonary fibrosis: Secondary | ICD-10-CM | POA: Diagnosis not present

## 2021-03-29 HISTORY — DX: Atherosclerotic heart disease of native coronary artery without angina pectoris: I25.10

## 2021-03-29 HISTORY — DX: Hyperlipidemia, unspecified: E78.5

## 2021-03-29 HISTORY — DX: Pulmonary fibrosis, unspecified: J84.10

## 2021-03-29 HISTORY — DX: Type 2 diabetes mellitus without complications: E11.9

## 2021-03-29 HISTORY — DX: Chronic combined systolic (congestive) and diastolic (congestive) heart failure: I50.42

## 2021-03-29 HISTORY — DX: Essential (primary) hypertension: I10

## 2021-03-29 LAB — CBC WITH DIFFERENTIAL/PLATELET
Abs Immature Granulocytes: 0.18 10*3/uL — ABNORMAL HIGH (ref 0.00–0.07)
Basophils Absolute: 0 10*3/uL (ref 0.0–0.1)
Basophils Relative: 0 %
Eosinophils Absolute: 0.1 10*3/uL (ref 0.0–0.5)
Eosinophils Relative: 0 %
HCT: 40 % (ref 39.0–52.0)
Hemoglobin: 12 g/dL — ABNORMAL LOW (ref 13.0–17.0)
Immature Granulocytes: 1 %
Lymphocytes Relative: 5 %
Lymphs Abs: 1 10*3/uL (ref 0.7–4.0)
MCH: 28.9 pg (ref 26.0–34.0)
MCHC: 30 g/dL (ref 30.0–36.0)
MCV: 96.4 fL (ref 80.0–100.0)
Monocytes Absolute: 1.7 10*3/uL — ABNORMAL HIGH (ref 0.1–1.0)
Monocytes Relative: 8 %
Neutro Abs: 18 10*3/uL — ABNORMAL HIGH (ref 1.7–7.7)
Neutrophils Relative %: 86 %
Platelets: 259 10*3/uL (ref 150–400)
RBC: 4.15 MIL/uL — ABNORMAL LOW (ref 4.22–5.81)
RDW: 16.9 % — ABNORMAL HIGH (ref 11.5–15.5)
WBC: 20.9 10*3/uL — ABNORMAL HIGH (ref 4.0–10.5)
nRBC: 0 % (ref 0.0–0.2)

## 2021-03-29 LAB — URINALYSIS, ROUTINE W REFLEX MICROSCOPIC
Bilirubin Urine: NEGATIVE
Glucose, UA: NEGATIVE mg/dL
Ketones, ur: NEGATIVE mg/dL
Leukocytes,Ua: NEGATIVE
Nitrite: NEGATIVE
Protein, ur: 100 mg/dL — AB
Specific Gravity, Urine: 1.014 (ref 1.005–1.030)
pH: 6 (ref 5.0–8.0)

## 2021-03-29 LAB — RESPIRATORY PANEL BY PCR

## 2021-03-29 LAB — BASIC METABOLIC PANEL
Anion gap: 13 (ref 5–15)
BUN: 46 mg/dL — ABNORMAL HIGH (ref 8–23)
CO2: 19 mmol/L — ABNORMAL LOW (ref 22–32)
Calcium: 8.6 mg/dL — ABNORMAL LOW (ref 8.9–10.3)
Chloride: 104 mmol/L (ref 98–111)
Creatinine, Ser: 2.33 mg/dL — ABNORMAL HIGH (ref 0.61–1.24)
GFR, Estimated: 30 mL/min — ABNORMAL LOW (ref >60)
Glucose, Bld: 72 mg/dL (ref 70–99)
Potassium: 4.6 mmol/L (ref 3.5–5.1)
Sodium: 136 mmol/L (ref 135–145)

## 2021-03-29 LAB — TROPONIN I (HIGH SENSITIVITY)
Troponin I (High Sensitivity): 117 ng/L (ref <18)
Troponin I (High Sensitivity): 197 ng/L (ref <18)

## 2021-03-29 LAB — CBG MONITORING, ED
Glucose-Capillary: 44 mg/dL — CL (ref 70–99)
Glucose-Capillary: 45 mg/dL — ABNORMAL LOW (ref 70–99)
Glucose-Capillary: 70 mg/dL (ref 70–99)

## 2021-03-29 LAB — BRAIN NATRIURETIC PEPTIDE: B Natriuretic Peptide: 1096.1 pg/mL — ABNORMAL HIGH (ref 0.0–100.0)

## 2021-03-29 LAB — GLUCOSE, CAPILLARY: Glucose-Capillary: 129 mg/dL — ABNORMAL HIGH (ref 70–99)

## 2021-03-29 LAB — PROTIME-INR
INR: 1.9 — ABNORMAL HIGH (ref 0.8–1.2)
Prothrombin Time: 21.5 seconds — ABNORMAL HIGH (ref 11.4–15.2)

## 2021-03-29 MED ORDER — SODIUM CHLORIDE 0.9 % IV SOLN
2.0000 g | INTRAVENOUS | Status: DC
  Administered 2021-03-29: 2 g via INTRAVENOUS
  Filled 2021-03-29: qty 2

## 2021-03-29 MED ORDER — DIPHENHYDRAMINE HCL 50 MG/ML IJ SOLN: 12.5000 mg | INTRAMUSCULAR | Status: DC | PRN

## 2021-03-29 MED ORDER — MORPHINE SULFATE (PF) 2 MG/ML IV SOLN
1.0000 mg | INTRAVENOUS | Status: DC | PRN
  Administered 2021-03-29: 1 mg via INTRAVENOUS
  Filled 2021-03-29: qty 1

## 2021-03-29 MED ORDER — BIOTENE DRY MOUTH MT LIQD: 15.0000 mL | OROMUCOSAL | Status: DC | PRN

## 2021-03-29 MED ORDER — HALOPERIDOL LACTATE 5 MG/ML IJ SOLN: 0.5000 mg | INTRAMUSCULAR | Status: DC | PRN

## 2021-03-29 MED ORDER — POLYVINYL ALCOHOL 1.4 % OP SOLN: 1.0000 [drp] | Freq: Four times a day (QID) | OPHTHALMIC | Status: DC | PRN

## 2021-03-29 MED ORDER — FUROSEMIDE 10 MG/ML IJ SOLN
60.0000 mg | Freq: Once | INTRAMUSCULAR | Status: AC
  Administered 2021-03-29: 60 mg via INTRAVENOUS
  Filled 2021-03-29: qty 6

## 2021-03-29 MED ORDER — DEXTROSE 50 % IV SOLN: 50.0000 mL | Freq: Once | INTRAVENOUS | Status: DC

## 2021-03-29 MED ORDER — ONDANSETRON 4 MG PO TBDP: 4.0000 mg | ORAL_TABLET | Freq: Four times a day (QID) | ORAL | Status: DC | PRN

## 2021-03-29 MED ORDER — ACETAMINOPHEN 650 MG RE SUPP: 650.0000 mg | Freq: Four times a day (QID) | RECTAL | Status: DC | PRN

## 2021-03-29 MED ORDER — LORAZEPAM 2 MG/ML PO CONC: 1.0000 mg | ORAL | Status: DC | PRN

## 2021-03-29 MED ORDER — HALOPERIDOL LACTATE 2 MG/ML PO CONC: 0.5000 mg | ORAL | Status: DC | PRN

## 2021-03-29 MED ORDER — SODIUM CHLORIDE 0.9 % IV SOLN
500.0000 mg | INTRAVENOUS | Status: DC
  Administered 2021-03-29: 500 mg via INTRAVENOUS
  Filled 2021-03-29 (×2): qty 500

## 2021-03-29 MED ORDER — GLYCOPYRROLATE 0.2 MG/ML IJ SOLN: 0.2000 mg | INTRAMUSCULAR | Status: DC | PRN

## 2021-03-29 MED ORDER — LORAZEPAM 2 MG/ML IJ SOLN: 0.5000 mg | INTRAMUSCULAR | Status: DC | PRN

## 2021-03-29 MED ORDER — LORAZEPAM 1 MG PO TABS
1.0000 mg | ORAL_TABLET | ORAL | Status: DC | PRN
  Administered 2021-03-30: 1 mg via ORAL
  Filled 2021-03-29: qty 1

## 2021-03-29 MED ORDER — ACETAMINOPHEN 325 MG PO TABS: 650.0000 mg | ORAL_TABLET | Freq: Four times a day (QID) | ORAL | Status: DC | PRN

## 2021-03-29 MED ORDER — GLYCOPYRROLATE 0.2 MG/ML IJ SOLN
0.2000 mg | INTRAMUSCULAR | Status: DC | PRN
  Filled 2021-03-29: qty 1

## 2021-03-29 MED ORDER — GLYCOPYRROLATE 1 MG PO TABS
1.0000 mg | ORAL_TABLET | ORAL | Status: DC | PRN
  Administered 2021-03-31: 1 mg via ORAL

## 2021-03-29 MED ORDER — ONDANSETRON HCL 4 MG/2ML IJ SOLN: 4.0000 mg | Freq: Four times a day (QID) | INTRAMUSCULAR | Status: DC | PRN

## 2021-03-29 MED ORDER — HALOPERIDOL 0.5 MG PO TABS: 0.5000 mg | ORAL_TABLET | ORAL | Status: DC | PRN

## 2021-03-29 MED ORDER — METHYLPREDNISOLONE SODIUM SUCC 125 MG IJ SOLR
120.0000 mg | Freq: Two times a day (BID) | INTRAMUSCULAR | Status: DC
  Administered 2021-03-29 – 2021-03-30 (×3): 120 mg via INTRAVENOUS
  Filled 2021-03-29 (×3): qty 2

## 2021-03-29 MED ORDER — MORPHINE SULFATE (PF) 2 MG/ML IV SOLN: 1.0000 mg | INTRAVENOUS | Status: DC | PRN

## 2021-03-29 MED ORDER — LORAZEPAM 2 MG/ML IJ SOLN
1.0000 mg | INTRAMUSCULAR | Status: DC | PRN
  Administered 2021-03-30: 1 mg via INTRAVENOUS
  Filled 2021-03-29: qty 1

## 2021-03-29 NOTE — Progress Notes (Signed)
Pharmacy Antibiotic Note  KODAH MARET is a 65 y.o. male admitted on 03/29/2021 with pneumonia.  Pharmacy has been consulted for Cefepime dosing.  Plan: Cefepime 2g IV q24h Azithromycin per MD dosing -Monitor renal function, clinical status, and antibiotic plan  Height: 5\' 5"  (165.1 cm) Weight: 75.3 kg (166 lb) IBW/kg (Calculated) : 61.5  Temp (24hrs), Avg:97.5 F (36.4 C), Min:97.5 F (36.4 C), Max:97.5 F (36.4 C)  Recent Labs  Lab 03/29/21 1030  WBC 20.9*  CREATININE 2.33*    Estimated Creatinine Clearance: 30 mL/min (A) (by C-G formula based on SCr of 2.33 mg/dL (H)).    No Known Allergies  Antimicrobials this admission: Cefepime 9/1 >> Azithromycin 9/1 >>   Dose adjustments this admission: N/A  Microbiology results: none  Thank you for allowing pharmacy to be a part of this patient's care.  Joetta Manners, PharmD, [facility] Emergency Medicine Clinical Pharmacist ED RPh Phone: [place]: [phone]

## 2021-03-29 NOTE — Consult Note (Signed)
NAME:  Eugene [name], MRN:  [id number], DOB:  09/21/1955, LOS: 0 ADMISSION DATE:  03/29/2021, CONSULTATION DATE:  03/29/21 REFERRING MD:  Dr. Johnney Killian, CHIEF COMPLAINT:  Syncope   History of Present Illness:  65 y/o M, never smoker, who presented to [facility] on 9/1 after suffering a syncopal episode.   He is followed by [name] and was seen for pulmonary evaluation in March of 2022 for suspected pulmonary fibrosis. He had new onset hypoxic respiratory failure requiring O2 after a hospital stay in July 2022 for decompensated CHF (2L rest, 4L activity).  He was felt to have fibrosis possibly in the setting of autoimmune disease.  He is followed by Rheumatology for inflammatory myopathy / myositis.  He was seen in August of 2022 and his O2 needs had increased to 4L at rest and 9L with activity.  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    He was admitted to the hospital from 8/25-8/29 for suspected flare of IPF.  He was discharged on 20 mg prednisone at that time.  The patient returns to the ER 9/1 after wife heard him fall at home.  She found him on the floor.  The patient does not remember the fall.  He was trying to get to the bedside commode and that is the last thing he remembers.  CT Head negative.  Initial labs - BNP 1,096, Sr Cr 2.33 / BUN 46, troponin 111 >197, WBC 20.9, Hgb 12, platelets 259 & INR 1.9.  CXR showed chronic interstitial changes with superimposed pulmonary edema.  While in the ER, he had a hypoglycemic episode with glucose of 45 which was treated with 4oz orange juice  PCCM consulted for pulmonary evaluation.   He reports dry, non-productive cough.  Denies fevers, chills, known sick contacts.  No other acute symptoms other than shortness of breath, cough and decreased / limited activity tolerance. Denies  difficulty with swallowing / choking on foods.   Pertinent  Medical History  CAD with MI s/p CABG   HLD HTN AF on Coumadin  Chronic Combined Systolic / Diastolic CHF - LVEF 47-65%, grade 1 DD in 01/2021 on TTE VF s/p ICD  DVT  Pulmonary Fibrosis  CKD IIIb DM  Significant Hospital Events: Including procedures, antibiotic start and stop dates in addition to other pertinent events   9/1 Admit with hypoxic respiratory failure   Interim History / Subjective:  As above  Objective   Blood pressure 110/76, pulse (!) 103, temperature (!) 97.5 F (36.4 C), temperature source Oral, resp. rate (!) 28, height 5\' 5"  (1.651 m), weight 75.3 kg, SpO2 97 %.       No intake or output data in the 24 hours ending 03/29/21 1646 Filed Weights   03/29/21 1026  Weight: 75.3 kg    Examination: General: ill appearing adult male lying in bed in NAD HENT: MM pink/moist, supraclavicular circular area of dry crusting, right hard nodular/mobile lesion above clavicle (pt reports it has been there for 45 years, unchanged), anicteric Lungs: tachypnea without significant distress, breath sounds diminished R>L bilaterally Cardiovascular: s1s2 irr irr, no m/r/g, central chest remote surgical scar from CABG Abdomen: soft, non-tender, bsx4 active Extremities: warm/dry, trace pre-tibial edema Neuro: AAOx4, speech clear, MAE    Resolved Hospital Problem list     Assessment & Plan:   Acute on Chronic  Hypoxic Respiratory Failure  Usuasal Interstitial Pneumonia / IPF Myositis  Most recent HRCT with defining features of UIP with progression of disease.  He has had a progressive decline since July 2022 with increasing O2 needs.  He has been on prednisone 20 mg since 8/22 (on PJP prophylaxis). He was recently hospitalized with no significant improvement in symptom burden on IV steroids.  Returns with concern for UIP flare.  Must also consider acute infectious process, component of edema with elevated BNP / CKD /  CHF.  -solumedrol 80 mg IV Q8 -assess RVP, PCT  -lasix 60 mg IV x1, judicious diuresis -follow BMP  -pulmonary hygiene - IS, mobilize as able  -heated high flow O2, wean for sats >87%, anticipate desaturations with movement, slow recovery  -empiric cefepime, azithromycin > likely to stop in am  -hold bactrim for now -Palliative Care consultation for symptom burden  -DNR / DNI  -BiPAP QHS if tolerated  -if no improvement in next 24 hours, consider transition to comfort -PRN ativan, morphine for dyspnea relief / anxiety  Best Practice (right click and "Reselect all SmartList Selections" daily)  Diet/type: Regular consistency (see orders) DVT prophylaxis: Coumadin GI prophylaxis: PPI Code Status:  DNR Last date of multidisciplinary goals of care discussion:  Goals of care discussed with patient at bedside with MD, NP, patient, wife and brother present.  We discussed the patients history since July 2022.  He has had a decline in functional status and O2 needs since that time.  CT with worsening of UIP pattern.  Reviewed mechanical ventilation concepts with patient.  He elects for DNR.  Support offered.   Labs   CBC: Recent Labs  Lab 03/29/21 1030  WBC 20.9*  NEUTROABS 18.0*  HGB 12.0*  HCT 40.0  MCV 96.4  PLT 409    Basic Metabolic Panel: Recent Labs  Lab 03/29/21 1030  NA 136  K 4.6  CL 104  CO2 19*  GLUCOSE 72  BUN 46*  CREATININE 2.33*  CALCIUM 8.6*   GFR: Estimated Creatinine Clearance: 30 mL/min (A) (by C-G formula based on SCr of 2.33 mg/dL (H)). Recent Labs  Lab 03/29/21 1030  WBC 20.9*    Liver Function Tests: No results for input(s): AST, ALT, ALKPHOS, BILITOT, PROT, ALBUMIN in the last 168 hours. No results for input(s): LIPASE, AMYLASE in the last 168 hours. No results for input(s): AMMONIA in the last 168 hours.  ABG No results found for: PHART, PCO2ART, PO2ART, HCO3, TCO2, ACIDBASEDEF, O2SAT   Coagulation Profile: Recent Labs  Lab  03/29/21 1030  INR 1.9*    Cardiac Enzymes: No results for input(s): CKTOTAL, CKMB, CKMBINDEX, TROPONINI in the last 168 hours.  HbA1C: No results found for: HGBA1C  CBG: No results for input(s): GLUCAP in the last 168 hours.  Review of Systems: Positives in [place]  Gen: Denies fever, chills, weight change, fatigue, night sweats HEENT: Denies blurred vision, double vision, hearing loss, tinnitus, sinus congestion, rhinorrhea, sore throat, neck stiffness, dysphagia PULM: Denies shortness of breath, cough, sputum production, hemoptysis, wheezing CV: Denies chest pain, edema, orthopnea, paroxysmal nocturnal dyspnea, palpitations GI: Denies abdominal pain, nausea, vomiting, diarrhea, hematochezia, melena, constipation, change in bowel habits GU: Denies dysuria, hematuria, polyuria, oliguria, urethral discharge Endocrine: Denies hot or cold intolerance, polyuria, polyphagia or appetite change Derm: Denies rash, dry skin, scaling or peeling skin change Heme: Denies easy bruising, bleeding, bleeding gums Neuro: Denies headache, numbness, weakness, slurred speech, loss of memory or consciousness   Past Medical History:  He,  has a past medical history of CHF (congestive heart failure) ([place]), Coronary artery disease, and Cystic fibrosis ([place]).   Surgical History:   Past Surgical History:  Procedure Laterality Date   CORONARY ARTERY BYPASS GRAFT       Social History:   reports that he has never smoked. He has never used smokeless tobacco. He reports that he does not currently use alcohol. He reports that he does not use drugs.   Family History:  His family history is not on file.   Allergies No Known Allergies   Home Medications  Prior to Admission medications   Medication Sig Start Date End Date Taking? Authorizing Provider  benzonatate (TESSALON) 200 MG capsule Take 200 mg by mouth 3 (three) times daily as needed for cough. 03/22/21  Yes [provider]  clopidogrel  (PLAVIX) 75 MG tablet Take 75 mg by mouth daily. 01/23/21  Yes [provider]  furosemide (LASIX) 40 MG tablet Take 40 mg by mouth daily. 01/29/21  Yes [provider]  insulin glargine (LANTUS) 100 UNIT/ML injection Inject 60 Units into the skin daily.   Yes [provider]  metoprolol succinate (TOPROL-XL) 50 MG 24 hr tablet Take 50 mg by mouth 2 (two) times daily. 12/20/20  Yes [provider]  mycophenolate (CELLCEPT) 500 MG tablet Take 1,000 mg by mouth 2 (two) times daily. 03/17/21  Yes [provider]  nitroGLYCERIN (NITROSTAT) 0.4 MG SL tablet Place 0.4 mg under the tongue every 5 (five) minutes as needed for chest pain. 11/03/20  Yes [provider]  NOVOLOG FLEXPEN 100 UNIT/ML FlexPen Inject 2-10 Units into the skin 3 (three) times daily before meals. Sliding scale: 140-199 =2 units, 200-250 = 4 units, 251-299 = 6 units, 300-349 = 8 units, 350 or > = 10 units 01/31/21  Yes [provider]  OXYGEN Inhale 5-9 L into the lungs continuous.   Yes [provider]  OZEMPIC, 1 MG/DOSE, 4 MG/3ML SOPN Inject 1 mg into the skin once a week. On Friday 01/23/21  Yes [provider]  pantoprazole (PROTONIX) 40 MG tablet Take 40 mg by mouth daily. 03/03/21  Yes [provider]  predniSONE (DELTASONE) 10 MG tablet Take 10-20 mg by mouth as directed. Taking 20mg  daily  for 2 weeks, then 15 mg daily for 2 weeks, then 10 mg daily until finished 03/22/21  Yes [provider]  ranolazine (RANEXA) 500 MG 12 hr tablet Take 500 mg by mouth 2 (two) times daily. 01/24/21  Yes [provider]  rosuvastatin (CRESTOR) 40 MG tablet Take 40 mg by mouth daily. 01/03/21  Yes [provider]  sodium chloride (OCEAN) 0.65 % SOLN nasal spray Place 1 spray into both nostrils daily as needed for congestion.   Yes [provider]  sulfamethoxazole-trimethoprim (BACTRIM DS) 800-160 MG tablet Take 1 tablet by mouth 3  (three) times a week. Jory Sims, Friday 03/22/21  Yes [provider]  vitamin B-12 (CYANOCOBALAMIN) 1000 MCG tablet Take 1,000 mcg by mouth daily.   Yes [provider]  warfarin (COUMADIN) 5 MG tablet Take 2.5-5 mg by mouth See admin instructions. Taking 5 mg daily except on Sunday taking 2.5 mg   Yes [provider]     Critical care time: n/a     Noe Gens, MSN, APRN, NP-C, AGACNP-BC [facility] 03/29/2021, 6:37 PM   Please see [email or web address] for pager details.   From 7A-7P if no response, please call  [phone] After hours, please call ELink [phone]

## 2021-03-29 NOTE — ED Notes (Signed)
Blood draw unsuccessful. Nurse was notified

## 2021-03-29 NOTE — ED Notes (Signed)
Pt's family advised pt feels that his blood sugar may be decreasing. CBG found to be 45. Pt caox4 and able to swallow. 4 oz orange juice provided initially. Crackers, cheese, peanut butter and one additional cup of 4 oz placed at bedside with family and admitting doctors in room.

## 2021-03-29 NOTE — H&P (Signed)
History and Physical    Eugene [name] FXT:[id number] DOB: Jun 14, 1956 DOA: 03/29/2021  PCP: Eugene Bowen, MD Consultants:  Eugene [name] - pulmonology; Eugene [name]/Chandrasekhar - cardiology Patient coming from:  Home - lives with wife (together 36 years, married for 6 months; NOK: Wife, Eugene [name], [phone]  Chief Complaint: Syncope  HPI: Eugene [name] is a 65 y.o. male with medical history significant of chronic combined CHF with AICD: stage 3b CKD; CAD; HTN; HLD; DM; and ILD presenting with syncope.  He was last admitted from 8/25-29 for ILD exacerbation.  They report that ILD was incidentally diagnosed in April when he came in for CAD/CABG.  He had an AICD jolt in June and since then they have noticed ongoing progression of his lung disease.  At the time of admission in early July, he was on no home O2.  Now he requires 9L for any exertion.  He is miserable all the time.  He has only been married 6 months and is worried about dying before his affairs are in order.  His primary objective at this time is comfort.  However, he also would like a pulm consult to see if there is anything they can do to give him more  time.    ED Course: Left hospital 2 days ago with severe pulm fibrosis on 9L O2.  Had syncope today.  Likely ready for hospice.   Review of Systems: As per HPI; otherwise review of systems reviewed and negative.    Past Medical History:  Diagnosis Date   Chronic combined systolic (congestive) and diastolic (congestive) heart failure (HCC)    has AICD   Coronary artery disease    Diabetes (HCC)    Dyslipidemia    Hypertension    Pulmonary fibrosis (HCC)     Past Surgical History:  Procedure Laterality Date   CORONARY ARTERY BYPASS GRAFT      Social History   Socioeconomic History   Marital status: Married    Spouse name: Not on file   Number of children: Not on file   Years of education: Not on file   Highest education level: Not on file  Occupational History   Not on  file  Tobacco Use   Smoking status: Never   Smokeless tobacco: Never  Substance and Sexual Activity   Alcohol use: Not Currently   Drug use: Never   Sexual activity: Not Currently  Other Topics Concern   Not on file  Social History Narrative   Not on file   Social Determinants of Health   Financial Resource Strain: Not on file  Food Insecurity: Not on file  Transportation Needs: Not on file  Physical Activity: Not on file  Stress: Not on file  Social Connections: Not on file  Intimate Partner Violence: Not on file    No Known Allergies  History reviewed. No pertinent family history.  Prior to Admission medications   Medication Sig Start Date End Date Taking? Authorizing Provider  benzonatate (TESSALON) 200 MG capsule Take 200 mg by mouth 3 (three) times daily as needed for cough. 03/22/21  Yes [provider]  clopidogrel (PLAVIX) 75 MG tablet Take 75 mg by mouth daily. 01/23/21  Yes [provider]  furosemide (LASIX) 40 MG tablet Take 40 mg by mouth daily. 01/29/21  Yes [provider]  insulin glargine (LANTUS) 100 UNIT/ML injection Inject 60 Units into the skin daily.   Yes [provider]  metoprolol succinate (TOPROL-XL) 50 MG 24 hr tablet Take  50 mg by mouth 2 (two) times daily. 12/20/20  Yes [provider]  mycophenolate (CELLCEPT) 500 MG tablet Take 1,000 mg by mouth 2 (two) times daily. 03/17/21  Yes [provider]  nitroGLYCERIN (NITROSTAT) 0.4 MG SL tablet Place 0.4 mg under the tongue every 5 (five) minutes as needed for chest pain. 11/03/20  Yes [provider]  NOVOLOG FLEXPEN 100 UNIT/ML FlexPen Inject 2-10 Units into the skin 3 (three) times daily before meals. Sliding scale: 140-199 =2 units, 200-250 = 4 units, 251-299 = 6 units, 300-349 = 8 units, 350 or > = 10 units 01/31/21  Yes [provider]  OXYGEN Inhale 5-9 L into the lungs continuous.   Yes [provider]  OZEMPIC, 1 MG/DOSE, 4  MG/3ML SOPN Inject 1 mg into the skin once a week. On Friday 01/23/21  Yes [provider]  pantoprazole (PROTONIX) 40 MG tablet Take 40 mg by mouth daily. 03/03/21  Yes [provider]  predniSONE (DELTASONE) 10 MG tablet Take 10-20 mg by mouth as directed. Taking 20mg  daily  for 2 weeks, then 15 mg daily for 2 weeks, then 10 mg daily until finished 03/22/21  Yes [provider]  ranolazine (RANEXA) 500 MG 12 hr tablet Take 500 mg by mouth 2 (two) times daily. 01/24/21  Yes [provider]  rosuvastatin (CRESTOR) 40 MG tablet Take 40 mg by mouth daily. 01/03/21  Yes [provider]  sodium chloride (OCEAN) 0.65 % SOLN nasal spray Place 1 spray into both nostrils daily as needed for congestion.   Yes [provider]  sulfamethoxazole-trimethoprim (BACTRIM DS) 800-160 MG tablet Take 1 tablet by mouth 3 (three) times a week. Jory Sims, Friday 03/22/21  Yes [provider]  vitamin B-12 (CYANOCOBALAMIN) 1000 MCG tablet Take 1,000 mcg by mouth daily.   Yes [provider]  warfarin (COUMADIN) 5 MG tablet Take 2.5-5 mg by mouth See admin instructions. Taking 5 mg daily except on Sunday taking 2.5 mg   Yes [provider]    Physical Exam: Vitals:   03/29/21 1529 03/29/21 1600 03/29/21 1821 03/29/21 1831  BP:  110/76 105/67   Pulse: 97 (!) 103 (!) 114   Resp: (!) 36 (!) 28 (!) 25   Temp:      TempSrc:      SpO2: 95% 97% 97% 97%  Weight:      Height:         General:  Appears ill, significant effort at breathe with tachypnea to 40s with minimal conversation Eyes:  PERRL, EOMI, normal lids, iris ENT:  grossly normal hearing, lips & tongue, mmm Neck:  no LAD, masses or thyromegaly Cardiovascular:  RR with tachycardia.   Respiratory:   Diminished breath sounds B with markedly increased respiratory effort. Abdomen:  soft, NT, ND Skin:  no rash or induration seen on limited exam Musculoskeletal:  grossly normal tone BUE/BLE,  good ROM, no bony abnormality Psychiatric:  blunted mood and affect, speech fluent and appropriate, AOx3 Neurologic:  CN 2-12 grossly intact, moves all extremities in coordinated fashion    Radiological Exams on Admission: Independently reviewed - see discussion in A/P where applicable  CT Head Wo Contrast  Result Date: 03/29/2021 CLINICAL DATA:  Head trauma EXAM: CT HEAD WITHOUT CONTRAST TECHNIQUE: Contiguous axial images were obtained from the base of the skull through the vertex without intravenous contrast. COMPARISON:  CT head dated September 09, 2020 FINDINGS: Brain: No evidence of acute infarction, hemorrhage, hydrocephalus, extra-axial  collection or mass lesion/mass effect. Chronic small vessel ischemic change. Vascular: No hyperdense vessel or unexpected calcification. Skull: Normal. Negative for fracture or focal lesion. Sinuses/Orbits: Air-fluid level in the left sphenoid sinus. No acute finding. Other: None. IMPRESSION: No acute intracranial abnormality. Electronically Signed   By: Yetta Glassman M.D.   On: 03/29/2021 12:03   DG Chest Port 1 View  Result Date: 03/29/2021 CLINICAL DATA:  Fall EXAM: PORTABLE CHEST 1 VIEW COMPARISON:  Chest radiograph 03/22/2021, chest CT 03/22/2021 FINDINGS: Unchanged, enlarged cardiac silhouette with prior median sternotomy, postsurgical changes of CABG, single lead AICD, and cardiac loop recorder overlying the left medial lower chest. Changes of chronic fibrotic interstitial lung disease with increased opacities throughout the right lung. There is no acute osseous abnormality. Thoracic spondylosis. IMPRESSION: Chronic fibrotic interstitial lung disease with increased opacities throughout the right lung, which can be seen with superimposed edema or exacerbation of interstitial lung disease. Unchanged enlarged cardiac silhouette. Electronically Signed   By: Maurine Simmering M.D.   On: 03/29/2021 11:28    EKG: Independently reviewed.  Afib with rate 96;  nonspecific ST changes with no evidence of acute ischemia   Labs on Admission: I have personally reviewed the available labs and imaging studies at the time of the admission.  Pertinent labs:   Glucose 72, 45, 44 BNP 1096.1 HS troponin 117, 197 WBC 20.9 Hgb 12.0 INR 1.9   Assessment/Plan Principal Problem:   Pulmonary fibrosis (HCC) Active Problems:   Chronic combined systolic (congestive) and diastolic (congestive) heart failure (HCC)   Coronary artery disease   Diabetes (HCC)   Dyslipidemia   Hypertension   Pulmonary fibrosis/ILD -Patient of Dr. Chase [name] with rapidly progressive ILD -Chronic O2-dependent respiratory failure, uses up to 9L now -He reports inability to function at home now with marked SOB with even transferring to bedside commode and collpased today due to associated weakness -Patient appears to have begun the rapid progression phase and palliative care/Hospice is reasonable.  -Patient/family have a clearly stated goal of comfort at this time; however they also request pulm assistance -At this time, we will do a bit of a hybrid approach - mostly end of life care but also with some pulm intervention to see if it results in improvement  End of life care -After long discussion in the ER, family has decided to proceed with comfort care only (other than pulm interventions, as above) -He is admitted to [facility] for comfort care and palliative care consult -Patient is likely to be a candidate for [employer] or other residential hospice, as he does not appear to be actively dying at this time -However, his reserve is likely quite low given her age and overall frailty -Comfort care order set utilized -Pain/dyspnea/anxiety control with morphine as needed, would transition to a drip if he has ongoing progression -Chaplain consulted to help with ACP documents - he wants to get his affairs in order   DVT prophylaxis: None - comfort measures Code Status: DNR - confirmed  with family Family Communication: Wife was present throughout evaluation; brother came in near the end of the discussion and helped participate Disposition Plan: Anticipate in-hospital death vs. Residential hospice Consults called: Pulmonology; palliative care Admission status: Admit - It is my clinical opinion that admission to INPATIENT is reasonable and necessary because of the expectation that this patient will require hospital care that crosses at least 2 midnights to treat this condition based on the medical complexity of the problems presented.  Given the aforementioned  information, the predictability of an adverse outcome is felt to be significant.     Karmen Bongo MD [facility]   How to contact the [facility] Attending or Consulting provider [place] or covering provider during after hours [place], for this patient?  Check the care team in [facility] and look for a) attending/consulting TR[facility] provider listed and b) the [facility] team listed Log into [email or web address] and use [place]'s universal password to access. If you do not have the password, please contact the hospital operator. Locate the [facility] provider you are looking for under [facility] and page to a number that you can be directly reached. If you still have difficulty reaching the provider, please page the [facility] (Director on Call) for the Hospitalists listed on amion for assistance.   03/29/2021, 6:54 PM

## 2021-03-29 NOTE — Progress Notes (Signed)
Orthopedic Tech Progress Note Patient Details:  Eugene [name] 07-03-1956 [id number] Level 2 not needed Patient ID: Eugene [name], male   DOB: 12-16-1955, 65 y.o.   MRN: [id number]  [name] 03/29/2021, 12:31 PM

## 2021-03-29 NOTE — ED Notes (Signed)
Attempted to call report, advised they will be calling back.

## 2021-03-29 NOTE — ED Notes (Signed)
CT only running 2 scanners, delay in scans d/t procedures waiting. CT to complete scan when able.

## 2021-03-29 NOTE — ED Provider Notes (Addendum)
[facility] EMERGENCY DEPARTMENT Provider Note   CSN: [id number] Arrival date & time: 03/29/21  1013     History Chief Complaint  Patient presents with   Eugene [name] is a 65 y.o. male.  HPI Patient reports he does not recall passing out.  He reports he was trying to get up to use the bedside commode and that saw last thing he remembers.  His wife heard a noise and found him on the floor.  Patient reports he is extremely short of breath.  He reports he is on 9 L of oxygen at home but even minimal activity causes his oxygen level to go down in the 70s.  He reports when that happens, he feels extremely short of breath and anxious.  He denies he is in pain.  He denies headache or chest pain.  Patient was just discharged from the hospital 2 days ago.    Past Medical History:  Diagnosis Date   CHF (congestive heart failure) ([place])    Coronary artery disease    Cystic fibrosis (HCC)     There are no problems to display for this patient.   Past Surgical History:  Procedure Laterality Date   CORONARY ARTERY BYPASS GRAFT         No family history on file.  Social History   Tobacco Use   Smoking status: Never   Smokeless tobacco: Never  Substance Use Topics   Alcohol use: Not Currently   Drug use: Never    Home Medications Prior to Admission medications   Medication Sig Start Date End Date Taking? Authorizing Provider  benzonatate (TESSALON) 200 MG capsule Take 200 mg by mouth 3 (three) times daily as needed for cough. 03/22/21  Yes [provider]  clopidogrel (PLAVIX) 75 MG tablet Take 75 mg by mouth daily. 01/23/21  Yes [provider]  furosemide (LASIX) 40 MG tablet Take 40 mg by mouth daily. 01/29/21  Yes [provider]  insulin glargine (LANTUS) 100 UNIT/ML injection Inject 60 Units into the skin daily.   Yes [provider]  metoprolol succinate (TOPROL-XL) 50 MG 24 hr tablet Take 50 mg by mouth 2 (two)  times daily. 12/20/20  Yes [provider]  mycophenolate (CELLCEPT) 500 MG tablet Take 1,000 mg by mouth 2 (two) times daily. 03/17/21  Yes [provider]  nitroGLYCERIN (NITROSTAT) 0.4 MG SL tablet Place 0.4 mg under the tongue every 5 (five) minutes as needed for chest pain. 11/03/20  Yes [provider]  NOVOLOG FLEXPEN 100 UNIT/ML FlexPen Inject 2-10 Units into the skin 3 (three) times daily before meals. Sliding scale: 140-199 =2 units, 200-250 = 4 units, 251-299 = 6 units, 300-349 = 8 units, 350 or > = 10 units 01/31/21  Yes [provider]  OXYGEN Inhale 5-9 L into the lungs continuous.   Yes [provider]  OZEMPIC, 1 MG/DOSE, 4 MG/3ML SOPN Inject 1 mg into the skin once a week. On Friday 01/23/21  Yes [provider]  pantoprazole (PROTONIX) 40 MG tablet Take 40 mg by mouth daily. 03/03/21  Yes [provider]  predniSONE (DELTASONE) 10 MG tablet Take 10-20 mg by mouth as directed. Taking 20mg  daily  for 2 weeks, then 15 mg daily for 2 weeks, then 10 mg daily until finished 03/22/21  Yes [provider]  ranolazine (RANEXA) 500 MG 12 hr tablet Take 500 mg by mouth 2 (two) times daily. 01/24/21  Yes  [provider]  rosuvastatin (CRESTOR) 40 MG tablet Take 40 mg by mouth daily. 01/03/21  Yes [provider]  sodium chloride (OCEAN) 0.65 % SOLN nasal spray Place 1 spray into both nostrils daily as needed for congestion.   Yes [provider]  sulfamethoxazole-trimethoprim (BACTRIM DS) 800-160 MG tablet Take 1 tablet by mouth 3 (three) times a week. Jory Sims, Friday 03/22/21  Yes [provider]  vitamin B-12 (CYANOCOBALAMIN) 1000 MCG tablet Take 1,000 mcg by mouth daily.   Yes [provider]  warfarin (COUMADIN) 5 MG tablet Take 2.5-5 mg by mouth See admin instructions. Taking 5 mg daily except on Sunday taking 2.5 mg   Yes [provider]    Allergies    Patient has no known  allergies.  Review of Systems   Review of Systems 10 systems reviewed and negative except as per HPI Physical Exam Updated Vital Signs BP 119/71   Pulse 79   Temp (!) 97.5 F (36.4 C) (Oral)   Resp (!) 26   Ht 5\' 5"  (1.651 m)   Wt 75.3 kg   SpO2 93%   BMI 27.62 kg/m   Physical Exam Constitutional:      Comments: Alert GCS 15.  Moderate increased work of breathing on nonrebreather mask  HENT:     Head: Normocephalic and atraumatic.     Mouth/Throat:     Pharynx: Oropharynx is clear.  Eyes:     Extraocular Movements: Extraocular movements intact.  Cardiovascular:     Rate and Rhythm: Normal rate. Rhythm irregular.  Pulmonary:     Comments: Moderate increased work of breathing.  Speaking in short clear sentences.  Crackles throughout lung fields. Abdominal:     General: There is no distension.     Palpations: Abdomen is soft.     Tenderness: There is no abdominal tenderness. There is no guarding.  Musculoskeletal:        General: No deformity or signs of injury.  Skin:    General: Skin is warm.  Neurological:     General: No focal deficit present.     Mental Status: He is oriented to person, place, and time.    ED Results / Procedures / Treatments   Labs (all labs ordered are listed, but only abnormal results are displayed) Labs Reviewed  BASIC METABOLIC PANEL - Abnormal; Notable for the following components:      Result Value   CO2 19 (*)    BUN 46 (*)    Creatinine, Ser 2.33 (*)    Calcium 8.6 (*)    GFR, Estimated 30 (*)    All other components within normal limits  BRAIN NATRIURETIC PEPTIDE - Abnormal; Notable for the following components:   B Natriuretic Peptide 1,096.1 (*)    All other components within normal limits  CBC WITH DIFFERENTIAL/PLATELET - Abnormal; Notable for the following components:   WBC 20.9 (*)    RBC 4.15 (*)    Hemoglobin 12.0 (*)    RDW 16.9 (*)    Neutro Abs 18.0 (*)    Monocytes Absolute 1.7 (*)    Abs Immature Granulocytes  0.18 (*)    All other components within normal limits  PROTIME-INR - Abnormal; Notable for the following components:   Prothrombin Time 21.5 (*)    INR 1.9 (*)    All other components within normal limits  TROPONIN I (HIGH SENSITIVITY) - Abnormal; Notable for the following components:   Troponin I (High Sensitivity) 117 (*)  All other components within normal limits  URINALYSIS, ROUTINE W REFLEX MICROSCOPIC  TROPONIN I (HIGH SENSITIVITY)    EKG EKG Interpretation  Date/Time:  Thursday March 29 2021 10:20:50 EDT Ventricular Rate:  96 PR Interval:    QRS Duration: 121 QT Interval:  381 QTC Calculation: 482 R Axis:   260 Text Interpretation: Atrial fibrillation RBBB and LAFB agree, similar to old Confirmed by Charlesetta Shanks [phone]) on 03/29/2021 1:21:18 PM  Radiology CT Head Wo Contrast  Result Date: 03/29/2021 CLINICAL DATA:  Head trauma EXAM: CT HEAD WITHOUT CONTRAST TECHNIQUE: Contiguous axial images were obtained from the base of the skull through the vertex without intravenous contrast. COMPARISON:  CT head dated September 09, 2020 FINDINGS: Brain: No evidence of acute infarction, hemorrhage, hydrocephalus, extra-axial collection or mass lesion/mass effect. Chronic small vessel ischemic change. Vascular: No hyperdense vessel or unexpected calcification. Skull: Normal. Negative for fracture or focal lesion. Sinuses/Orbits: Air-fluid level in the left sphenoid sinus. No acute finding. Other: None. IMPRESSION: No acute intracranial abnormality. Electronically Signed   By: Yetta Glassman M.D.   On: 03/29/2021 12:03   DG Chest Port 1 View  Result Date: 03/29/2021 CLINICAL DATA:  Fall EXAM: PORTABLE CHEST 1 VIEW COMPARISON:  Chest radiograph 03/22/2021, chest CT 03/22/2021 FINDINGS: Unchanged, enlarged cardiac silhouette with prior median sternotomy, postsurgical changes of CABG, single lead AICD, and cardiac loop recorder overlying the left medial lower chest. Changes of chronic  fibrotic interstitial lung disease with increased opacities throughout the right lung. There is no acute osseous abnormality. Thoracic spondylosis. IMPRESSION: Chronic fibrotic interstitial lung disease with increased opacities throughout the right lung, which can be seen with superimposed edema or exacerbation of interstitial lung disease. Unchanged enlarged cardiac silhouette. Electronically Signed   By: Maurine Simmering M.D.   On: 03/29/2021 11:28    Procedures Procedures   Medications Ordered in ED Medications - No data to display  ED Course  I have reviewed the triage vital signs and the nursing notes.  Pertinent labs & imaging results that were available during my care of the patient were reviewed by me and considered in my medical decision making (see chart for details).    MDM Rules/Calculators/A&P                           Patient presents with severe chronic respiratory failure due to rapidly advancing pulmonary fibrosis.  Patient is getting extremely hypoxic with minimal activity and requiring very high levels of oxygen.  He had a syncopal episode today that appears unprovoked..  Patient does not appear to be stable at his baseline.  Plan for admission for stabilization and dressing possible transition to hospice.  Patient and his wife are aware that his condition is rapidly deteriorating and may require transition to hospice.  At this time, patient and his wife do not feel comfortable with him at home due to severity of shortness of breath.  Consult: Dr. Lorin Mercy for admission Final Clinical Impression(s) / ED Diagnoses Final diagnoses:  Syncope and collapse  Minor head injury, initial encounter  Acute on chronic respiratory failure with hypoxia [facility])    Rx / DC Orders ED Discharge Orders     None        Charlesetta Shanks, MD 03/29/21 1400    Charlesetta Shanks, MD 03/29/21 1550

## 2021-03-29 NOTE — ED Triage Notes (Signed)
Patient was in bed and his wife heard a noise and found patient on the floor, Patient doesn't remember falling. Upon ems arrival patient was alert/oriented. States he has a strong cardiac history and is on Warfarin states he is end stage CF and is on 9 liters /[place] at home. Patient denies any injury.

## 2021-03-30 ENCOUNTER — Telehealth: Payer: Self-pay | Admitting: Internal Medicine

## 2021-03-30 DIAGNOSIS — J9621 Acute and chronic respiratory failure with hypoxia: Secondary | ICD-10-CM | POA: Diagnosis not present

## 2021-03-30 DIAGNOSIS — Z66 Do not resuscitate: Secondary | ICD-10-CM

## 2021-03-30 DIAGNOSIS — R0602 Shortness of breath: Secondary | ICD-10-CM

## 2021-03-30 DIAGNOSIS — R55 Syncope and collapse: Secondary | ICD-10-CM

## 2021-03-30 DIAGNOSIS — Z515 Encounter for palliative care: Secondary | ICD-10-CM

## 2021-03-30 DIAGNOSIS — S0990XA Unspecified injury of head, initial encounter: Secondary | ICD-10-CM

## 2021-03-30 DIAGNOSIS — J8410 Pulmonary fibrosis, unspecified: Secondary | ICD-10-CM | POA: Diagnosis not present

## 2021-03-30 DIAGNOSIS — I5042 Chronic combined systolic (congestive) and diastolic (congestive) heart failure: Secondary | ICD-10-CM

## 2021-03-30 DIAGNOSIS — Z7189 Other specified counseling: Secondary | ICD-10-CM

## 2021-03-30 DIAGNOSIS — Z789 Other specified health status: Secondary | ICD-10-CM

## 2021-03-30 DIAGNOSIS — L8990 Pressure ulcer of unspecified site, unspecified stage: Secondary | ICD-10-CM | POA: Insufficient documentation

## 2021-03-30 LAB — PROCALCITONIN
Procalcitonin: 0.29 ng/mL
Procalcitonin: 0.26 ng/mL

## 2021-03-30 MED ORDER — MORPHINE BOLUS VIA INFUSION
1.0000 mg | INTRAVENOUS | Status: DC | PRN
  Filled 2021-03-30: qty 3

## 2021-03-30 MED ORDER — BIOTENE DRY MOUTH MT LIQD
15.0000 mL | Freq: Two times a day (BID) | OROMUCOSAL | Status: DC
  Administered 2021-03-30: 15 mL via TOPICAL

## 2021-03-30 MED ORDER — LORAZEPAM 2 MG/ML PO CONC: 1.0000 mg | ORAL | Status: DC | PRN

## 2021-03-30 MED ORDER — HALOPERIDOL LACTATE 2 MG/ML PO CONC: 2.0000 mg | Freq: Four times a day (QID) | ORAL | Status: DC | PRN

## 2021-03-30 MED ORDER — HALOPERIDOL LACTATE 5 MG/ML IJ SOLN
2.0000 mg | Freq: Four times a day (QID) | INTRAMUSCULAR | Status: DC | PRN
  Administered 2021-03-31: 2 mg via INTRAVENOUS
  Filled 2021-03-30: qty 1

## 2021-03-30 MED ORDER — MORPHINE 100MG IN NS 100ML (1MG/ML) PREMIX INFUSION
2.0000 mg/h | INTRAVENOUS | Status: DC
Start: 2021-03-30 — End: 2021-03-31
  Administered 2021-03-30: 2 mg/h via INTRAVENOUS
  Filled 2021-03-30: qty 100

## 2021-03-30 MED ORDER — LORAZEPAM 2 MG/ML IJ SOLN
1.0000 mg | INTRAMUSCULAR | Status: DC | PRN
  Administered 2021-03-30 (×2): 1 mg via INTRAVENOUS
  Filled 2021-03-30 (×2): qty 1

## 2021-03-30 MED ORDER — HALOPERIDOL 1 MG PO TABS: 2.0000 mg | ORAL_TABLET | Freq: Four times a day (QID) | ORAL | Status: DC | PRN

## 2021-03-30 MED ORDER — LORAZEPAM 1 MG PO TABS: 1.0000 mg | ORAL_TABLET | ORAL | Status: DC | PRN

## 2021-03-30 NOTE — Consult Note (Signed)
NAME:  Eugene [name], MRN:  [id number], DOB:  June 22, 1956, LOS: 1 ADMISSION DATE:  03/29/2021, CONSULTATION DATE:  03/29/21 REFERRING MD:  Dr. Johnney Killian, CHIEF COMPLAINT:  Syncope   History of Present Illness:  65 y/o M, never smoker, who presented to [facility] on 9/1 after suffering a syncopal episode.   He is followed by [name] and was seen for pulmonary evaluation in March of 2022 for suspected pulmonary fibrosis. He had new onset hypoxic respiratory failure requiring O2 after a hospital stay in July 2022 for decompensated CHF (2L rest, 4L activity).  He was felt to have fibrosis possibly in the setting of autoimmune disease.  He is followed by Rheumatology for inflammatory myopathy / myositis.  He was seen in August of 2022 and his O2 needs had increased to 4L at rest and 9L with activity.  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    He was admitted to the hospital from 8/25-8/29 for suspected flare of IPF.  He was discharged on 20 mg prednisone at that time.  The patient returns to the ER 9/1 after wife heard him fall at home.  She found him on the floor.  The patient does not remember the fall.  He was trying to get to the bedside commode and that is the last thing he remembers.  CT Head negative.  Initial labs - BNP 1,096, Sr Cr 2.33 / BUN 46, troponin 111 >197, WBC 20.9, Hgb 12, platelets 259 & INR 1.9.  CXR showed chronic interstitial changes with superimposed pulmonary edema.  While in the ER, he had a hypoglycemic episode with glucose of 45 which was treated with 4oz orange juice  PCCM consulted for pulmonary evaluation.   He reports dry, non-productive cough.  Denies fevers, chills, known sick contacts.  No other acute symptoms other than shortness of breath, cough and decreased / limited activity tolerance. Denies  difficulty with swallowing / choking on foods.   Pertinent  Medical History  CAD with MI s/p CABG   HLD HTN AF on Coumadin  Chronic Combined Systolic / Diastolic CHF - LVEF 22-02%, grade 1 DD in 01/2021 on TTE VF s/p ICD  DVT  Pulmonary Fibrosis  CKD IIIb DM  Significant Hospital Events: Including procedures, antibiotic start and stop dates in addition to other pertinent events   9/1 Admit with hypoxic respiratory failure   Interim History / Subjective:  No acute events overnight. Not feeling much better today. Remains on [place]. Patient's wife and brother are at the bedside. Discussed palliative care would be coming by to meet with them.   Objective   Blood pressure (!) 92/59, pulse 85, temperature (!) 97.5 F (36.4 C), temperature source Oral, resp. rate (!) 22, height 5\' 5"  (1.651 m), weight 75.3 kg, SpO2 95 %.    FiO2 (%):  [100 %] 100 %   Intake/Output Summary (Last 24 hours) at 03/30/2021 0951 Last data filed at 03/30/2021 0500 Gross per 24 hour  Intake 350 ml  Output 1600 ml  Net -1250 ml   Filed Weights   03/29/21 1026  Weight: 75.3 kg    Examination: General: chronically ill appearing, mild distress HEENT: [place]/AT, moist mucous membranes, sclera anicteric Neuro: A&O x 3, moving all extremities CV: tachycardic, s1s2, no murmurs PULM: diminished breath sounds, dry rales posteriorly, short shallow breaths. No wheezing  GI: soft, non-tender, non-distended, BS+ Extremities: warm, trace edema bilaterally Skin:scattered ecchymoses    Resolved Hospital Problem list     Assessment & Plan:   Acute on Chronic Hypoxic Respiratory Failure  Usuasal Interstitial Pneumonia / IPF Myositis  Most recent HRCT with defining features of UIP with progression of disease.  He has had a progressive decline since July 2022 with increasing O2 needs.  He has been on prednisone 20 mg since 8/22 (on PJP prophylaxis) with no response. He was recently hospitalized with no significant  improvement in symptom burden on IV steroids.  Returns with concern for UIP flare and progressive fibrotic lung disease that is steroid unresponsive.  He does have a component of heart failure and pulmonary edema. He does not seem to have an acute infection that is driving this episode.   - Overall poor prognosis was discussed with the patient and family. I informed them palliative care has been consulted and transition to hospice care would be appropriate.  - Continue HHFNC for comfort measures at this time. Discussed a time to potentially remove the [place] once they have discussed the transition in care with the palliative care team, as this will prolong his life.  - Can continue steroids and diuresis as needed - Can discontinue empiric antibiotics - PRN morphine and ativan for dyspnea/anxiety  PCCM will sign off but please call with any questions.   Labs   CBC: Recent Labs  Lab 03/29/21 1030  WBC 20.9*  NEUTROABS 18.0*  HGB 12.0*  HCT 40.0  MCV 96.4  PLT 196    Basic Metabolic Panel: Recent Labs  Lab 03/29/21 1030  NA 136  K 4.6  CL 104  CO2 19*  GLUCOSE 72  BUN 46*  CREATININE 2.33*  CALCIUM 8.6*   GFR: Estimated Creatinine Clearance: 30 mL/min (A) (by C-G formula based on SCr of 2.33 mg/dL (H)). Recent Labs  Lab 03/29/21 1030 03/30/21 0004 03/30/21 0452  PROCALCITON  --  0.29 0.26  WBC 20.9*  --   --     Liver Function Tests: No results for input(s): AST, ALT, ALKPHOS, BILITOT, PROT, ALBUMIN in the last 168 hours. No results for input(s): LIPASE, AMYLASE in the last 168 hours. No results for input(s): AMMONIA in the last 168 hours.  ABG No results found for: PHART, PCO2ART, PO2ART, HCO3, TCO2, ACIDBASEDEF, O2SAT   Coagulation Profile: Recent Labs  Lab 03/29/21 1030  INR 1.9*    Cardiac Enzymes: No results for input(s): CKTOTAL, CKMB, CKMBINDEX, TROPONINI in the last 168 hours.  HbA1C: No results found for: HGBA1C  CBG: Recent Labs  Lab  03/29/21 1730 03/29/21 1758 03/29/21 1903 03/29/21 2255  GLUCAP 45* 44* 70 129*    Critical care time: n/a     Freda Jackson, MD [facility] Office: [phone]   See Amion for personal pager PCCM on call pager [phone] until 7pm. Please call Elink 7p-7a. [phone]

## 2021-03-30 NOTE — Telephone Encounter (Signed)
Called patient and discussed concerns with family that Mr. Hollern may have significant pulmonary fibrosis that may not be amenable to long term therapy.  Patient notes he has fallen and is back in the hospital on 6 N.  Pulmonology and Palliative Care have been called.  Will attempt to see patient today or during the long weekend.  Rudean Haskell, MD [place], [address] [place], [place] [address] [phone]  8:57 AM

## 2021-03-30 NOTE — Progress Notes (Signed)
Called [employer] was able to speak [facility], she will page the on-call tech. The on-called tech call back and stated that the patient does not need to be turn off due to is just monitor the patient.

## 2021-03-30 NOTE — TOC CAGE-AID Note (Signed)
Transition of Care [facility]) - CAGE-AID Screening   Patient Details  Name: Eugene [name] MRN: [id number] Date of Birth: [birth year]  Transition of Care [facility]) CM/SW Contact:    [name], [place] Phone Number: 03/30/2021, 9:14 AM   Clinical Narrative: Pt is unable to participate in Cage Aid.  [name], MSW, LCSW-A Pronouns:  She/Her/Hers [facility] Health[facility]itions of Care Clinical Social Worker Direct Number:  [phone] [name]   CAGE-AID Screening: Substance Abuse Screening unable to be completed due to: : Patient unable to participate

## 2021-03-30 NOTE — Progress Notes (Signed)
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 Hi flow oxygen as tol Continue Lasix for comfort and stop labs  continue glycopyrrolate Haldol Ativan and morphine--1 mg twice daily as needed  CAD HTN DM TY 2 Stop all meds at this time Prior AICD [employer] Will turn this off prior to discharge disposition   DVT prophylaxis: none-comofrt patient Code Status: DNAR Family Communication: d/w wife/sister bedside Disposition:  Status is: Inpatient  Remains inpatient appropriate because:Hemodynamically unstable and Unsafe d/c plan  Dispo: The patient is from: Home              Anticipated d/c is to:  unclear at this time              Patient currently is not medically stable to d/c.   Difficult to place patient No       Consultants:  Pulm palliative  Procedures:   Antimicrobials:     Subjective: Awakens-coherent  Weak but verbal--understands planning and steps--met with wife/family  bedside also  Objective: Vitals:   03/29/21 2138 03/30/21 0409 03/30/21 0829 03/30/21 1015  BP: 105/76  (!) 92/59   Pulse: 91  85   Resp: (!) 25 (!) 28 (!) 22   Temp: 98.4 F (36.9 C)  (!) 97.5 F (36.4 C)   TempSrc: Oral  Oral   SpO2: 90%  95% 98%  Weight:      Height:        Intake/Output Summary (Last 24 hours) at 03/30/2021 1359 Last data filed at 03/30/2021 0500 Gross per 24 hour  Intake 350 ml  Output 1600 ml  Net -1250 ml   Filed Weights   03/29/21 1026  Weight: 75.3 kg    Examination:  Eomi thick neck mallampatti 4, mod dentition Cta b no rales no rhonchi Abd soft nt nd no rebound no guard Rom intact--full neuro exam deferred  Data Reviewed: personally reviewed   CBC    Component Value Date/Time   WBC 20.9 (H) 03/29/2021 1030   RBC 4.15 (L) 03/29/2021 1030   HGB 12.0 (L) 03/29/2021 1030   HCT 40.0 03/29/2021 1030   PLT 259 03/29/2021 1030   MCV 96.4 03/29/2021 1030   MCH 28.9 03/29/2021 1030   MCHC 30.0 03/29/2021 1030   RDW 16.9 (H) 03/29/2021 1030   LYMPHSABS 1.0 03/29/2021 1030   MONOABS 1.7 (H) 03/29/2021 1030   EOSABS 0.1 03/29/2021 1030   BASOSABS 0.0 03/29/2021 1030   CMP Latest Ref Rng & Units 03/29/2021  Glucose 70 - 99 mg/dL 72  BUN 8 - 23 mg/dL 46(H)  Creatinine 0.61 - 1.24 mg/dL 2.33(H)  Sodium 135 - 145 mmol/L 136  Potassium 3.5 - 5.1 mmol/L 4.6  Chloride 98 - 111 mmol/L 104  CO2 22 - 32 mmol/L 19(L)  Calcium 8.9 - 10.3 mg/dL 8.6(L)     Radiology Studies: CT Head Wo Contrast  Result Date: 03/29/2021 CLINICAL DATA:  Head trauma EXAM: CT HEAD WITHOUT CONTRAST TECHNIQUE: Contiguous axial images were obtained from the base of the skull through the vertex without intravenous contrast. COMPARISON:  CT head dated September 09, 2020 FINDINGS: Brain: No evidence of acute infarction, hemorrhage, hydrocephalus, extra-axial collection or mass lesion/mass effect. Chronic small vessel ischemic change. Vascular: No hyperdense vessel or unexpected  calcification. Skull: Normal. Negative for fracture or focal lesion. Sinuses/Orbits: Air-fluid level in the left sphenoid sinus. No acute finding. Other: None. IMPRESSION: No acute intracranial abnormality. Electronically Signed   By: Yetta Glassman M.D.   On: 03/29/2021 12:03   DG Chest Port 1 View  Result Date: 03/29/2021 CLINICAL DATA:  Fall EXAM: PORTABLE CHEST 1 VIEW COMPARISON:  Chest radiograph 03/22/2021, chest CT 03/22/2021 FINDINGS: Unchanged, enlarged cardiac silhouette with prior median sternotomy, postsurgical changes of CABG, single lead AICD, and cardiac loop recorder overlying the left medial lower chest. Changes of chronic fibrotic interstitial lung disease with increased opacities throughout the right lung. There is no acute osseous abnormality. Thoracic spondylosis. IMPRESSION: Chronic fibrotic interstitial lung disease with increased opacities throughout the right lung, which can be seen with superimposed edema or exacerbation of interstitial lung disease. Unchanged enlarged cardiac silhouette. Electronically Signed   By: Maurine Simmering M.D.   On: 03/29/2021 11:28     Scheduled Meds:  methylPREDNISolone (SOLU-MEDROL) injection  120 mg Intravenous BID   Continuous Infusions:  azithromycin Stopped (03/29/21 2043)   ceFEPime (MAXIPIME) IV Stopped (03/29/21 2034)     LOS: 1 day   Time spent: [place], MD [facility] To contact the attending provider between 7A-7P or the covering provider during after hours 7P-7A, please log into the web site [email or web address] and access using universal [place] password for that web site. If you do not have the password, please call the hospital operator.  03/30/2021, 1:59 PM

## 2021-03-30 NOTE — Consult Note (Signed)
Consultation Note Date: 03/30/2021   Patient Name: Eugene [name]  DOB: 30-Jul-1955  MRN: [id number]  Age / Sex: 65 y.o., male  PCP: [name], MD Referring Physician: Nita Sells, MD  Reason for Consultation: Disposition, Non pain symptom management, Pain control, Psychosocial/spiritual support, and Terminal Care  HPI/Patient Profile: 65 y.o. male  with past medical history of CHF and AICD, pulmonary fibrosis, DM, HTN, CKD stage IIIb, ILD presented to the ED on 03/29/21 from home after syncopal episode. Patient was admitted on 03/29/2021 for end of life care for his progressive pulmonary fibrosis/ILD.   Of note, patient was recently admitted from 8/25-8/29/22 for ILD exacerbation.   Clinical Assessment and Goals of Care: I have reviewed medical records including EPIC notes, labs, and imaging. Received report from primary RN - no acute concerns. RN reports patient has had a lot of anxiety; otherwise has been sleeping most of the day.  Went to visit patient at bedside - wife/[name] was present. Patient was lying in bed awake, alert, oriented, and able to participate in conversation. No signs or non-verbal gestures of pain or discomfort noted. No respiratory distress or respiratory secretions; he does have increased work of breathing which is exacerbated by talking. Patient denies pain; does endorse anxiety and shortness of breath. He is on 25L HFNC.  Met with patient and wife  to discuss diagnosis, prognosis, GOC, EOL wishes, disposition, and options.  I introduced Palliative Medicine as specialized medical care for people living with serious illness. It focuses on providing relief from the symptoms and stress of a serious illness. The goal is to improve quality of life for both the patient and the family.  We discussed a brief life review of the patient as well as functional and nutritional status. Patient  is married to [place] - they have been married for 6 months, but been dating for 17 years. They do not have any children together; however, the patient is very close to [name]'s son and her grandson, stating they are also "his family." Patient was living in a private residence alone with [name] prior to admission. Patient was diagnosed with ILD/pulmonary fibrosis on September 08, 2020. It was at this time patient noticed a drastic decline in his health. 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 Patient remained independent with all ADLs until about two months ago - he states his symptoms made it hard to walk and complete daily activities alone. Directly prior to hospitalization, patient states he wasn't even able to take "two steps" without having to stop and rest/catch his breath.  We discussed patient's current illness and what it means in the larger context of patient's on-going co-morbidities.  Natural disease trajectory and expectations at EOL were discussed. I attempted to elicit values and goals of care important to the patient. The difference between aggressive medical intervention and comfort care was considered in light of the patient's goals of care. Patient is clear he wishes to  focus on his comfort and quality of life at this time. We talked about transition to comfort measures in house and what that would entail inclusive of medications to control pain, dyspnea, agitation, nausea, itching, and hiccups. We discussed stopping all unnecessary measures such as blood draws, needle sticks, oxygen, antibiotics, CBGs/insulin, cardiac monitoring, IVF, and frequent vital signs. Patient is agreeable to full comfort measures in house.  We discuss dispo options, including home hospice vs residential hospice vs remain in house for EOL care. Patient's wish would be for residential hospice and [name] expressed wishing to bring the patient home. We discussed  transfer limitations, including that he would need to be able to wean his oxygen to 15L to be considered stable enough to transfer either home or to residential hospice. Expressed concern that once we start to wean his oxygen, he will likely decline quickly and not be stable for transfer - he would be at a high risk of passing during transport at that time. Patient and [name] expressed understanding and both agree that remaining in house for EOL care would be the goal. Patient is agreeable to begin weaning oxygen and replacing with comfort medications for symptom management.   We discussed family/friend visitation. Patient states he has visited with everyone important to him except [name]'s son and grandson who is currently traveling to see him. We discussed waiting to wean oxygen until he is able to visit with them, as it is anticipated he will likely decline quickly once that process is started. [name] states her son should be arriving either tonight or early tomorrow. After visitation, patient states he will be ready to wean oxygen and utilize comfort medications for symptoms.   A long time was spent discussing symptom management options/plan. Patient and [name] express concern that medications will make him too sleepy to sign important documents that need notarizing. I asked which documents he needed notarized - he was trying to get a Living Will and HCPOA document finalized. Primary HCPOA he requested was his wife/[name]. We reviewed that because I was able to speak with him today to hear his wishes, and that legally [name] would be his primary decision maker even without HCPOA, I recommended that comfort should be the focus over paperwork - as at this point, we did not need the notary to make his wishes known or to support his decision for comfort care. Patient and [name] were appreciative of this information and agreeable to focus on comfort instead of being conservative with medications waiting on notary. Patient also  confirms wish for DNR/DNI - validated that we as the medical team would support this. Patient is agreeable to have ICD turned off today.   Visit also consisted of discussions dealing with the complex and emotionally intense issues of symptom management and palliative care in the setting of serious and life-threatening illness. Palliative care team will continue to support patient, patient's family, and medical team.  Discussed with patient/family the importance of continued conversation with each other and the medical providers regarding overall plan of care and treatment options, ensuring decisions are within the context of the patient's values and GOCs.    Questions and concerns were addressed. The patient/family was encouraged to call with questions and/or concerns. PMT card was provided.  Reviewed plan of care with LPN - she will coordinate getting ICD turned off and will wait to wean oxygen until after patient visits with [name]'s son and grandson.   Primary Decision Maker: patient    SUMMARY OF  RECOMMENDATIONS   Continue full comfort measures Continue DNR/DNI as previously documented Patient is not able to transfer to residential hospice facility or home with hospice while requiring 25L HFNC, at minimum would need to be down to 15L. As oxygen is weaned, it is anticipated patient will decline and not be stable for transfer - would anticipate hospital death. Patient is agreeable to remain in house for EOL care Patient is agreeable to begin weaning oxygen and replacing with comfort medications for symptom management after his son and grandson visit. They should be arriving later tonight or tomorrow morning 9/3 Patient is agreeable to turn off ICD - LPN to call and coordinate this Added orders for EOL symptom management and to reflect full comfort measures, as well as discontinued orders that were not focused on comfort Unrestricted visitation orders were placed per current [facility]  EOL visitation policy  Provide frequent assessments and administer PRN medications as clinically necessary to ensure EOL comfort PMT will continue to follow holistically  Code Status/Advance Care Planning: DNR   Palliative Prophylaxis:  Aspiration, Bowel Regimen, Delirium Protocol, Frequent Pain Assessment, Oral Care, and Turn Reposition  Additional Recommendations (Limitations, Scope, Preferences): Full Comfort Care  Psycho-social/Spiritual:  Desire for further Chaplaincy support:no Created space and opportunity for patient and family to express thoughts and feelings regarding patient's current medical situation.  Emotional support and therapeutic listening provided.  Prognosis:  Hours - Days - patient will likely quickly decline once weaning oxygen begins   Discharge Planning: Anticipated Hospital Death      Primary Diagnoses: Present on Admission:  Pulmonary fibrosis ([place])  Chronic combined systolic (congestive) and diastolic (congestive) heart failure (HCC)  Coronary artery disease  Dyslipidemia  Hypertension   I have reviewed the medical record, interviewed the patient and family, and examined the patient. The following aspects are pertinent.  Past Medical History:  Diagnosis Date   Chronic combined systolic (congestive) and diastolic (congestive) heart failure (HCC)    has AICD   Coronary artery disease    Diabetes (HCC)    Dyslipidemia    Hypertension    Pulmonary fibrosis (HCC)    Social History   Socioeconomic History   Marital status: Married    Spouse name: Not on file   Number of children: Not on file   Years of education: Not on file   Highest education level: Not on file  Occupational History   Not on file  Tobacco Use   Smoking status: Never   Smokeless tobacco: Never  Substance and Sexual Activity   Alcohol use: Not Currently   Drug use: Never   Sexual activity: Not Currently  Other Topics Concern   Not on file  Social History  Narrative   Not on file   Social Determinants of Health   Financial Resource Strain: Not on file  Food Insecurity: Not on file  Transportation Needs: Not on file  Physical Activity: Not on file  Stress: Not on file  Social Connections: Not on file   History reviewed. No pertinent family history. Scheduled Meds:  methylPREDNISolone (SOLU-MEDROL) injection  120 mg Intravenous BID   Continuous Infusions:  azithromycin Stopped (03/29/21 2043)   ceFEPime (MAXIPIME) IV Stopped (03/29/21 2034)   PRN Meds:.acetaminophen **OR** acetaminophen, antiseptic oral rinse, diphenhydrAMINE, glycopyrrolate **OR** glycopyrrolate **OR** glycopyrrolate, haloperidol **OR** haloperidol **OR** haloperidol lactate, LORazepam **OR** LORazepam **OR** LORazepam, morphine injection, ondansetron **OR** ondansetron (ZOFRAN) IV, polyvinyl alcohol Medications Prior to Admission:  Prior to Admission medications   Medication Sig  Start Date End Date Taking? Authorizing Provider  benzonatate (TESSALON) 200 MG capsule Take 200 mg by mouth 3 (three) times daily as needed for cough. 03/22/21  Yes [provider]  clopidogrel (PLAVIX) 75 MG tablet Take 75 mg by mouth daily. 01/23/21  Yes [provider]  furosemide (LASIX) 40 MG tablet Take 40 mg by mouth daily. 01/29/21  Yes [provider]  insulin glargine (LANTUS) 100 UNIT/ML injection Inject 60 Units into the skin daily.   Yes [provider]  metoprolol succinate (TOPROL-XL) 50 MG 24 hr tablet Take 50 mg by mouth 2 (two) times daily. 12/20/20  Yes [provider]  mycophenolate (CELLCEPT) 500 MG tablet Take 1,000 mg by mouth 2 (two) times daily. 03/17/21  Yes [provider]  nitroGLYCERIN (NITROSTAT) 0.4 MG SL tablet Place 0.4 mg under the tongue every 5 (five) minutes as needed for chest pain. 11/03/20  Yes [provider]  NOVOLOG FLEXPEN 100 UNIT/ML FlexPen Inject 2-10 Units into the skin 3 (three) times daily  before meals. Sliding scale: 140-199 =2 units, 200-250 = 4 units, 251-299 = 6 units, 300-349 = 8 units, 350 or > = 10 units 01/31/21  Yes [provider]  OXYGEN Inhale 5-9 L into the lungs continuous.   Yes [provider]  OZEMPIC, 1 MG/DOSE, 4 MG/3ML SOPN Inject 1 mg into the skin once a week. On Friday 01/23/21  Yes [provider]  pantoprazole (PROTONIX) 40 MG tablet Take 40 mg by mouth daily. 03/03/21  Yes [provider]  predniSONE (DELTASONE) 10 MG tablet Take 10-20 mg by mouth as directed. Taking 60m daily  for 2 weeks, then 15 mg daily for 2 weeks, then 10 mg daily until finished 03/22/21  Yes [provider]  ranolazine (RANEXA) 500 MG 12 hr tablet Take 500 mg by mouth 2 (two) times daily. 01/24/21  Yes [provider]  rosuvastatin (CRESTOR) 40 MG tablet Take 40 mg by mouth daily. 01/03/21  Yes [provider]  sodium chloride (OCEAN) 0.65 % SOLN nasal spray Place 1 spray into both nostrils daily as needed for congestion.   Yes [provider]  sulfamethoxazole-trimethoprim (BACTRIM DS) 800-160 MG tablet Take 1 tablet by mouth 3 (three) times a week. MJory Sims Friday 03/22/21  Yes [provider]  vitamin B-12 (CYANOCOBALAMIN) 1000 MCG tablet Take 1,000 mcg by mouth daily.   Yes [provider]  warfarin (COUMADIN) 5 MG tablet Take 2.5-5 mg by mouth See admin instructions. Taking 5 mg daily except on Sunday taking 2.5 mg   Yes [provider]   No Known Allergies Review of Systems  Constitutional:  Positive for activity change and fatigue. Negative for appetite change.  Respiratory:  Positive for chest tightness and shortness of breath.   Gastrointestinal:  Negative for nausea and vomiting.  All other systems reviewed and are negative.  Physical Exam Vitals and nursing note reviewed.  Constitutional:      General: He is not in acute distress.    Appearance: He is ill-appearing.  Pulmonary:      Effort: No respiratory distress.     Comments: Increased work of breathing Skin:    General: Skin is warm and dry.  Neurological:     Mental Status: He is alert and oriented to person, place, and time.     Motor: Weakness present.  Psychiatric:        Attention and Perception: Attention normal.  Behavior: Behavior is cooperative.        Cognition and Memory: Cognition and memory normal.    Vital Signs: BP (!) 92/59 (BP Location: Right Arm)   Pulse 85   Temp (!) 97.5 F (36.4 C) (Oral)   Resp (!) 22   Ht _0  (1.651 m)   Wt 75.3 kg   SpO2 98%   BMI 27.62 kg/m  Pain Scale: 0-10   Pain Score: 0-No pain   SpO2: SpO2: 98 % O2 Device:SpO2: 98 % O2 Flow Rate: .O2 Flow Rate (L/min): 25 L/min  IO: Intake/output summary:  Intake/Output Summary (Last 24 hours) at 03/30/2021 1305 Last data filed at 03/30/2021 0500 Gross per 24 hour  Intake 350 ml  Output 1600 ml  Net -1250 ml    LBM: Last BM Date: 03/23/21 Baseline Weight: Weight: 75.3 kg Most recent weight: Weight: 75.3 kg     Palliative Assessment/Data: PPS 30%     Time In: 1330 Time Out: 1515 Time Total: 105 minutes  Greater than 50%  of this time was spent counseling and coordinating care related to the above assessment and plan.  Signed by: Lin Landsman, NP   Please contact Palliative Medicine Team phone at [phone] for questions and concerns.  For individual provider: See Shea Evans

## 2021-04-03 ENCOUNTER — Encounter (HOSPITAL_COMMUNITY): Payer: Self-pay | Admitting: Internal Medicine

## 2021-04-03 ENCOUNTER — Other Ambulatory Visit: Payer: Self-pay

## 2021-04-03 DIAGNOSIS — I509 Heart failure, unspecified: Secondary | ICD-10-CM | POA: Diagnosis not present

## 2021-04-03 NOTE — Patient Outreach (Signed)
[facility]) Care Management  04/03/2021  JUDD MCCUBBIN 28-Jul-1956 [id number]   CM notified that patient passed away.  CM will close case.  [name], RN, MSN [facility] Care Management Coordinator Direct Line [phone] Cell [phone] Toll Free: [phone]  Fax: [phone]

## 2021-04-04 ENCOUNTER — Other Ambulatory Visit: Payer: Medicare HMO

## 2021-04-04 ENCOUNTER — Ambulatory Visit: Payer: Self-pay

## 2021-04-11 ENCOUNTER — Ambulatory Visit: Payer: Medicare HMO | Admitting: Internal Medicine

## 2021-04-28 NOTE — Death Summary Note (Signed)
Death Summary  Eugene [name] DOB: [birth year] DOA: 04/07/2021  PCP: Eugene Bowen, MD  Admit date: 07-Apr-2021 Date of Death: 04/09/21 Time of Death: 6:26 am Notification: Eugene Bowen, MD notified of death of Apr 09, 2021   History of present illness:  Eugene [name] is a 65 y.o. male with a history of ILD, CHF, CKD stage 3b, T2DM, and CAD.  Eugene [name] presented with complaint of syncope due to worsening hypoxemic respiratory failure due to progressive ILD Eugene [name] did not improve after supplemental 02, systemic steroids, antibiotic therapy and diuresis.   65 yo male who presented with syncope episode. He had progressive dyspnea at home. On the day of his hospitalization his wife found him on the floor. He was brought to the hospital for further evaluation.  Patient was in respiratory distress, decreased breath sounds bilaterally, heart S1 and S2 present and tachycardic, abdomen soft and non tender, no lower extremity edema.  Diagnosed with rapid progression of pulmonary fibrosis, not steroids responsive.  Failed medical therapy, and transition to comfort care.   Final Diagnoses:  1.   Progressive ILD 2. COPD 3. CAD 4. I4PP 5. Diastolic heart failure  6, inflammatory myopathy/myositis.    The results of significant diagnostics from this hospitalization (including imaging, microbiology, ancillary and laboratory) are listed below for reference.    Significant Diagnostic Studies: CT Head Wo Contrast  Result Date: 07-Apr-2021 CLINICAL DATA:  Head trauma EXAM: CT HEAD WITHOUT CONTRAST TECHNIQUE: Contiguous axial images were obtained from the base of the skull through the vertex without intravenous contrast. COMPARISON:  CT head dated September 09, 2020 FINDINGS: Brain: No evidence of acute infarction, hemorrhage, hydrocephalus, extra-axial collection or mass lesion/mass effect. Chronic small vessel ischemic change. Vascular: No hyperdense vessel or unexpected calcification.  Skull: Normal. Negative for fracture or focal lesion. Sinuses/Orbits: Air-fluid level in the left sphenoid sinus. No acute finding. Other: None. IMPRESSION: No acute intracranial abnormality. Electronically Signed   By: Yetta Glassman M.D.   On: 04-07-21 12:03   DG Chest Port 1 View  Result Date: Apr 07, 2021 CLINICAL DATA:  Fall EXAM: PORTABLE CHEST 1 VIEW COMPARISON:  Chest radiograph 03/22/2021, chest CT 03/22/2021 FINDINGS: Unchanged, enlarged cardiac silhouette with prior median sternotomy, postsurgical changes of CABG, single lead AICD, and cardiac loop recorder overlying the left medial lower chest. Changes of chronic fibrotic interstitial lung disease with increased opacities throughout the right lung. There is no acute osseous abnormality. Thoracic spondylosis. IMPRESSION: Chronic fibrotic interstitial lung disease with increased opacities throughout the right lung, which can be seen with superimposed edema or exacerbation of interstitial lung disease. Unchanged enlarged cardiac silhouette. Electronically Signed   By: Maurine Simmering M.D.   On: Apr 07, 2021 11:28    Microbiology: Recent Results (from the past 240 hour(s))  Respiratory (~20 pathogens) panel by PCR     Status: None   Collection Time: 2021-04-07  8:41 PM   Specimen: Nasopharyngeal Swab; Respiratory  Result Value Ref Range Status   Adenovirus NOT DETECTED NOT DETECTED Final   Coronavirus 229E NOT DETECTED NOT DETECTED Final    Comment: (NOTE) The Coronavirus on the Respiratory Panel, DOES NOT test for the novel  Coronavirus (2019 nCoV)    Coronavirus HKU1 NOT DETECTED NOT DETECTED Final   Coronavirus NL63 NOT DETECTED NOT DETECTED Final   Coronavirus OC43 NOT DETECTED NOT DETECTED Final   Metapneumovirus NOT DETECTED NOT DETECTED Final   Rhinovirus / Enterovirus NOT DETECTED NOT DETECTED Final   Influenza A NOT  DETECTED NOT DETECTED Final   Influenza B NOT DETECTED NOT DETECTED Final   Parainfluenza Virus 1 NOT DETECTED NOT  DETECTED Final   Parainfluenza Virus 2 NOT DETECTED NOT DETECTED Final   Parainfluenza Virus 3 NOT DETECTED NOT DETECTED Final   Parainfluenza Virus 4 NOT DETECTED NOT DETECTED Final   Respiratory Syncytial Virus NOT DETECTED NOT DETECTED Final   Bordetella pertussis NOT DETECTED NOT DETECTED Final   Bordetella Parapertussis NOT DETECTED NOT DETECTED Final   Chlamydophila pneumoniae NOT DETECTED NOT DETECTED Final   Mycoplasma pneumoniae NOT DETECTED NOT DETECTED Final    Comment: Performed at [facility], [place] [address], [place],  [address]     Labs: Basic Metabolic Panel: Recent Labs  Lab 03/29/21 1030  NA 136  K 4.6  CL 104  CO2 19*  GLUCOSE 72  BUN 46*  CREATININE 2.33*  CALCIUM 8.6*   Liver Function Tests: No results for input(s): AST, ALT, ALKPHOS, BILITOT, PROT, ALBUMIN in the last 168 hours. No results for input(s): LIPASE, AMYLASE in the last 168 hours. No results for input(s): AMMONIA in the last 168 hours. CBC: Recent Labs  Lab 03/29/21 1030  WBC 20.9*  NEUTROABS 18.0*  HGB 12.0*  HCT 40.0  MCV 96.4  PLT 259   Cardiac Enzymes: No results for input(s): CKTOTAL, CKMB, CKMBINDEX, TROPONINI in the last 168 hours. D-Dimer No results for input(s): DDIMER in the last 72 hours. BNP: Invalid input(s): POCBNP CBG: Recent Labs  Lab 03/29/21 1730 03/29/21 1758 03/29/21 1903 03/29/21 2255  GLUCAP 45* 44* 70 129*   Anemia work up No results for input(s): VITAMINB12, FOLATE, FERRITIN, TIBC, IRON, RETICCTPCT in the last 72 hours. Urinalysis    Component Value Date/Time   COLORURINE YELLOW 03/29/2021 1945   APPEARANCEUR HAZY (A) 03/29/2021 1945   LABSPEC 1.014 03/29/2021 1945   PHURINE 6.0 03/29/2021 1945   GLUCOSEU NEGATIVE 03/29/2021 1945   HGBUR MODERATE (A) 03/29/2021 1945   BILIRUBINUR NEGATIVE 03/29/2021 1945   KETONESUR NEGATIVE 03/29/2021 1945   PROTEINUR 100 (A) 03/29/2021 1945   NITRITE NEGATIVE 03/29/2021 1945   LEUKOCYTESUR  NEGATIVE 03/29/2021 1945   Sepsis Labs Invalid input(s): PROCALCITONIN,  WBC,  LACTICIDVEN     SIGNED:  Tawni Millers, MD  [facility] 04/19/21, 4:23 PM Pager   If 7PM-7AM, please contact night-coverage [email or web address] Password [email or web address]

## 2021-04-28 DEATH — deceased

## 2021-04-30 ENCOUNTER — Ambulatory Visit (HOSPITAL_BASED_OUTPATIENT_CLINIC_OR_DEPARTMENT_OTHER): Payer: Medicare HMO | Admitting: Family

## 2021-05-16 ENCOUNTER — Ambulatory Visit: Payer: Medicare HMO | Admitting: Internal Medicine

## 2021-12-09 IMAGING — CT CT HEAD W/O CM
4 series · 17 of 47 positions shown, 19 images · non-contrast
Comparison: 06/01/2014

CLINICAL DATA: Neurological deficit.  Acute stroke suspected.

EXAM:
CT HEAD WITHOUT CONTRAST
TECHNIQUE: Contiguous axial images were obtained from the base of the skull
through the vertex without intravenous contrast.

[Series 3: head bone · axial · 0.45mm/px · z∈[+1348,+1402]mm · 4 of 77 slices shown]
[im 8/77  bone]
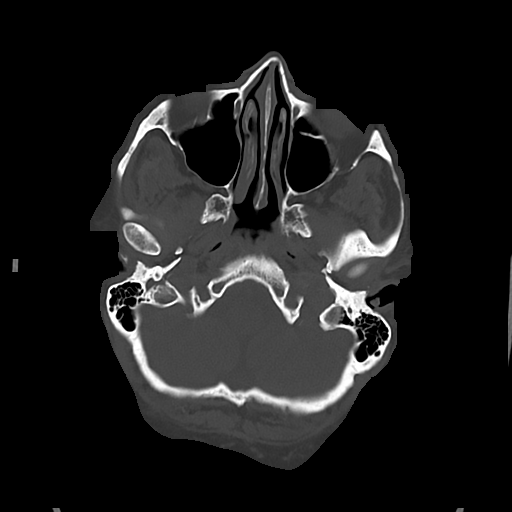
[im 16/77  bone]
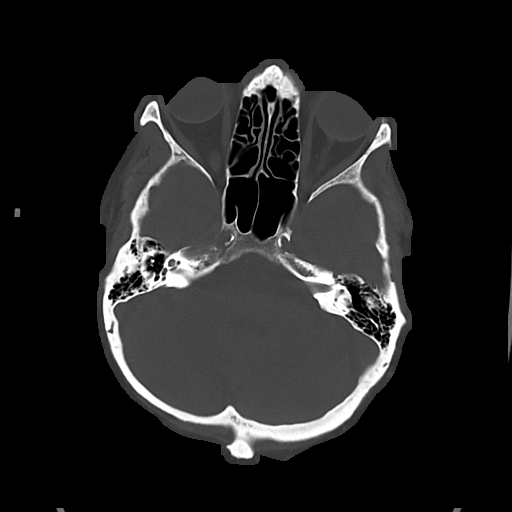
[im 23/77  bone]
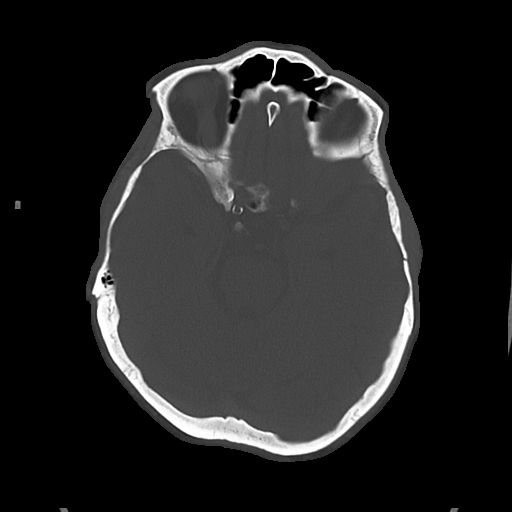
[im 35/77  bone]
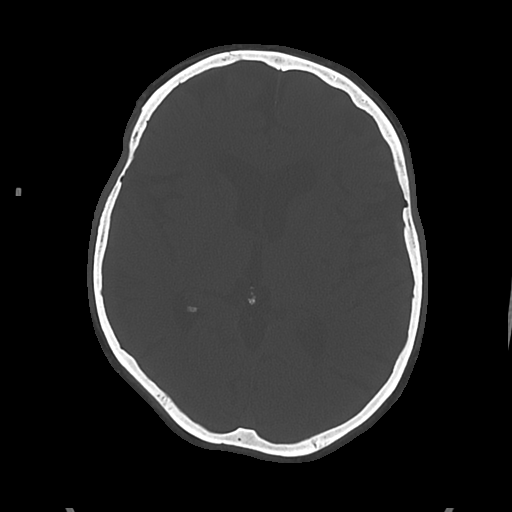

[Series 4: head wo · axial · 0.45mm/px · z∈[+1348,+1464]mm · 7 of 31 slices shown, 9 images]
[im 4/31  brain]
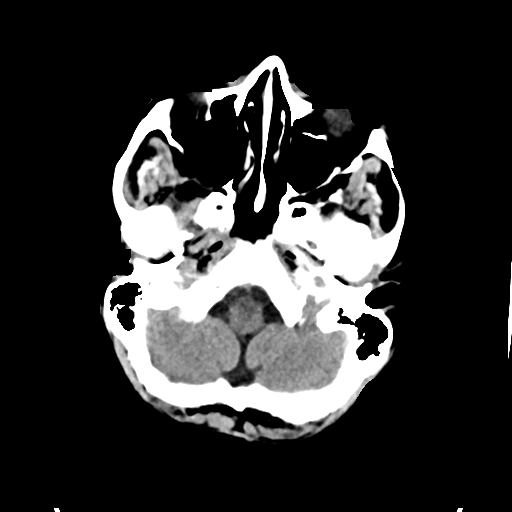
[im 4/31  bone]
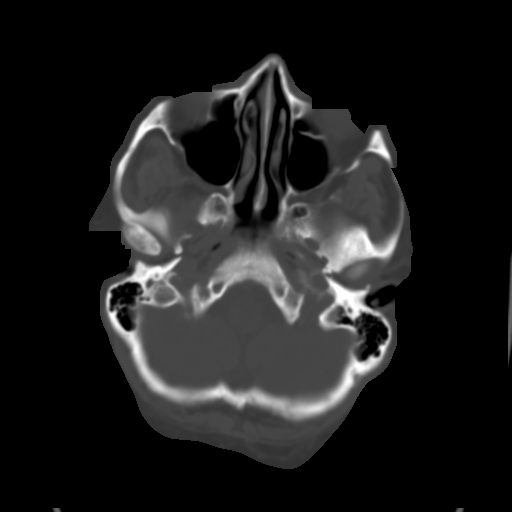
[im 8/31  brain]
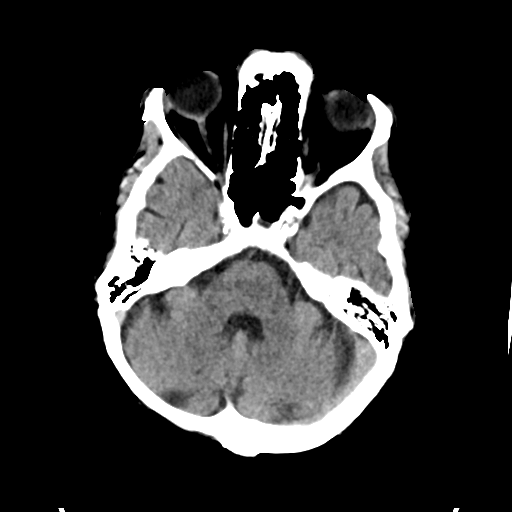
[im 12/31  brain]
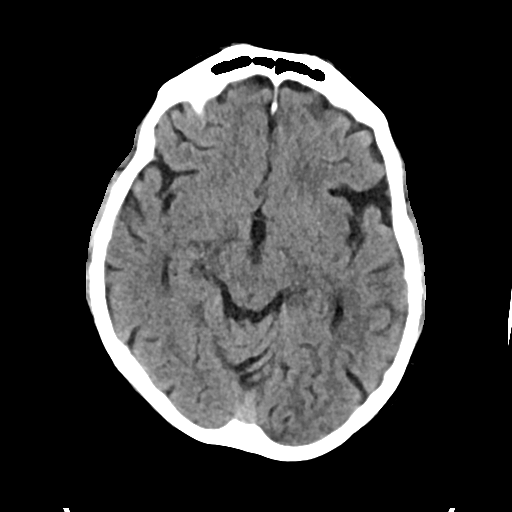
[im 16/31  brain]
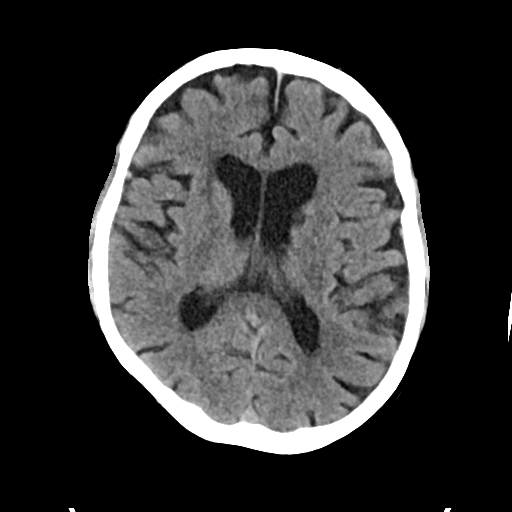
[im 19/31  brain]
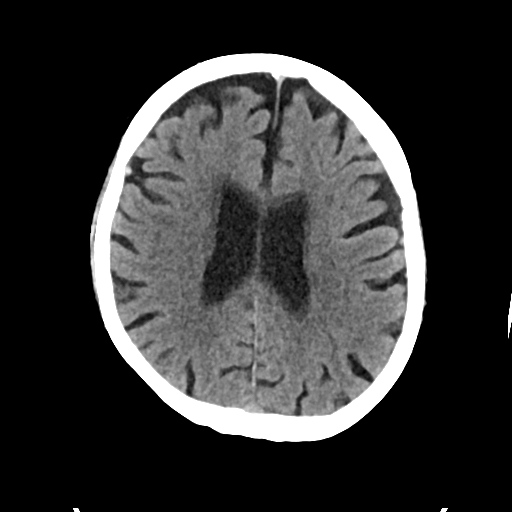
[im 19/31  bone]
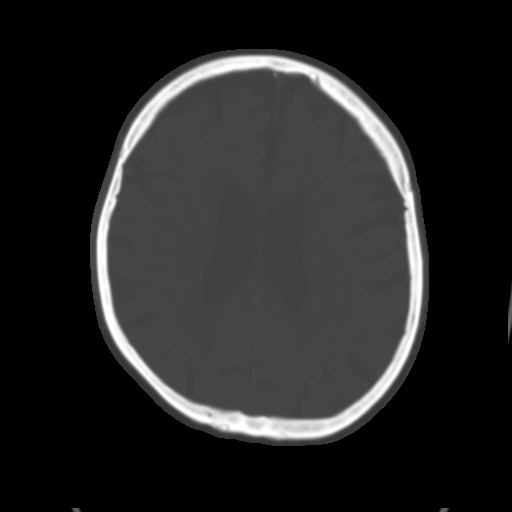
[im 23/31  brain]
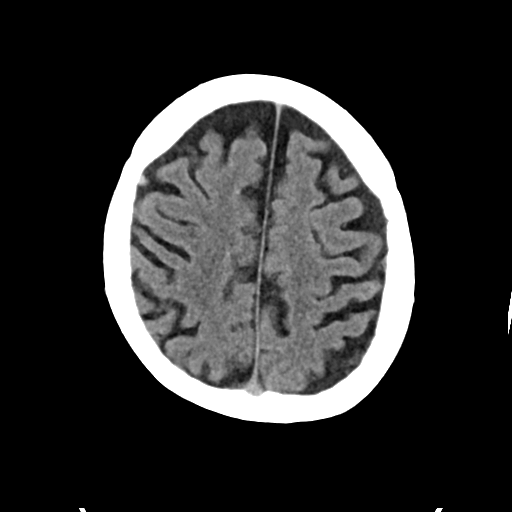
[im 27/31  brain]
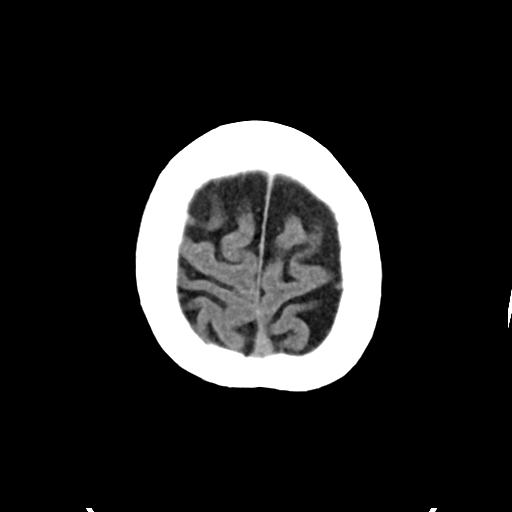

[Series 5: cor soft · coronal · 0.34mm/px · 3 of 66 slices shown]
[im 22/66  brain]
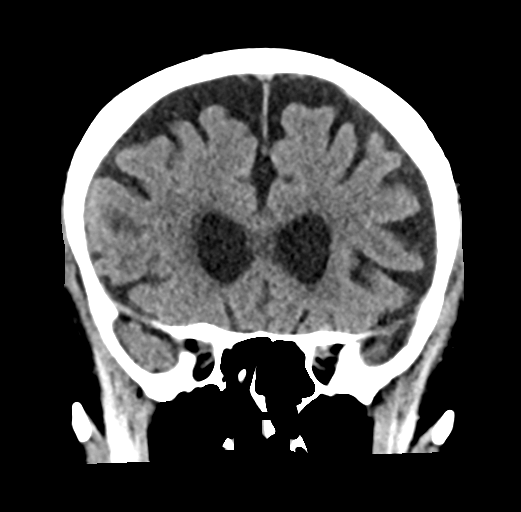
[im 29/66  brain]
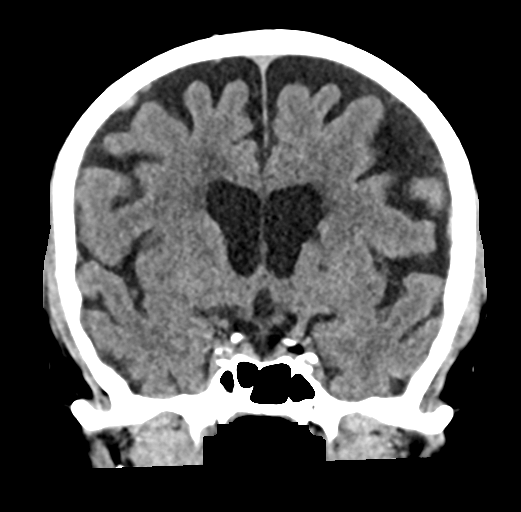
[im 37/66  brain]
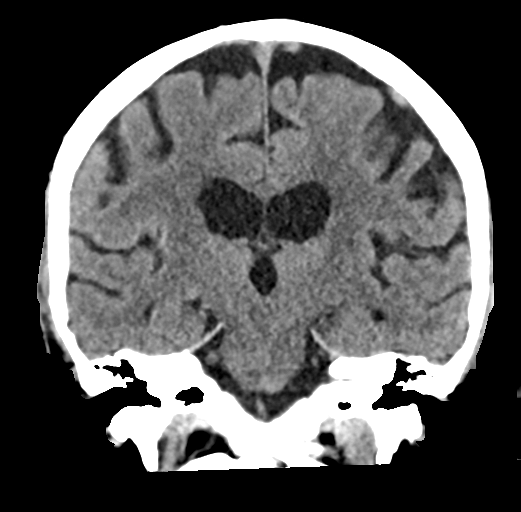

[Series 6: sag soft · sagittal · 0.35mm/px · 3 of 58 slices shown]
[im 20/58  brain]
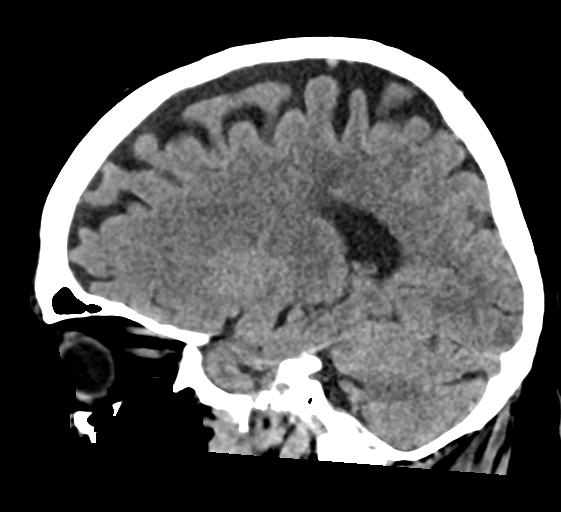
[im 29/58  brain]
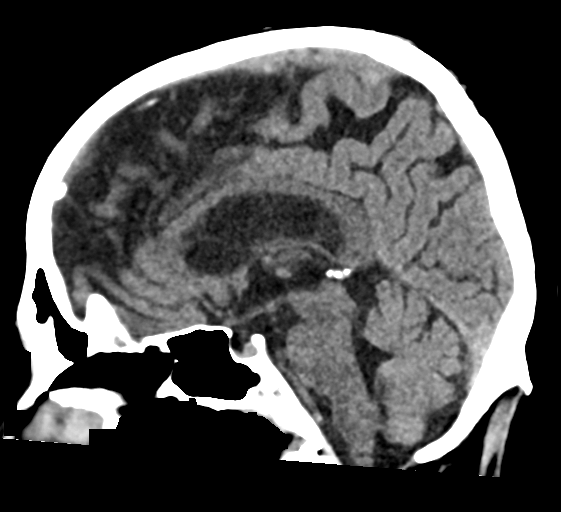
[im 39/58  brain]
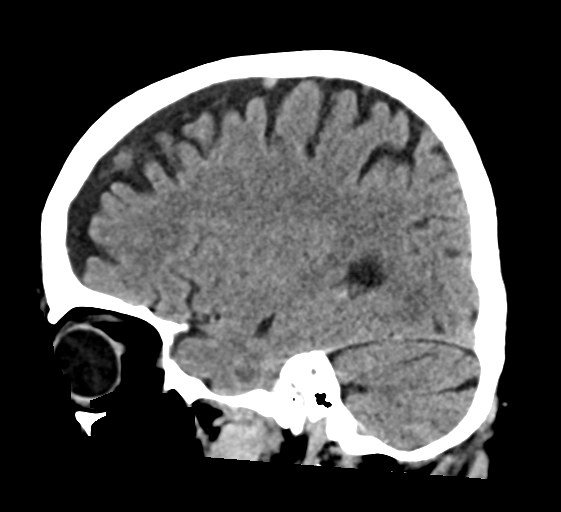

[17 of 47 positions shown; findings below may reference images not displayed]

FINDINGS: Brain: Mild to moderate generalized brain volume loss. Numerous
areas of low-density in the hemispheric white matter and basal
ganglia consistent with chronic small vessel disease. No sign of
acute infarction, mass lesion, hemorrhage, hydrocephalus or
extra-axial collection.

Vascular: There is atherosclerotic calcification of the major
vessels at the base of the brain.

Skull: Negative

Sinuses/Orbits: Clear/normal

Other: None
IMPRESSION: No acute finding by CT. Atrophy and chronic small-vessel ischemic
changes of the white matter and basal ganglia.

## 2022-06-28 IMAGING — CT CT HEAD W/O CM
4 series · 17 of 47 positions shown, 19 images · non-contrast
Comparison: CT head dated September 09, 2020

CLINICAL DATA: Head trauma

EXAM:
CT HEAD WITHOUT CONTRAST
TECHNIQUE: Contiguous axial images were obtained from the base of the skull
through the vertex without intravenous contrast.

[Series 3: head without · axial · non-contrast · 0.45mm/px · z∈[-33,+87]mm · 7 of 33 slices shown, 9 images]
[im 5/33  brain]
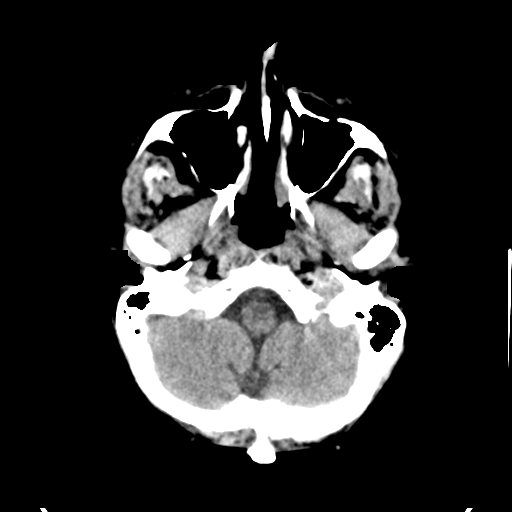
[im 5/33  bone]
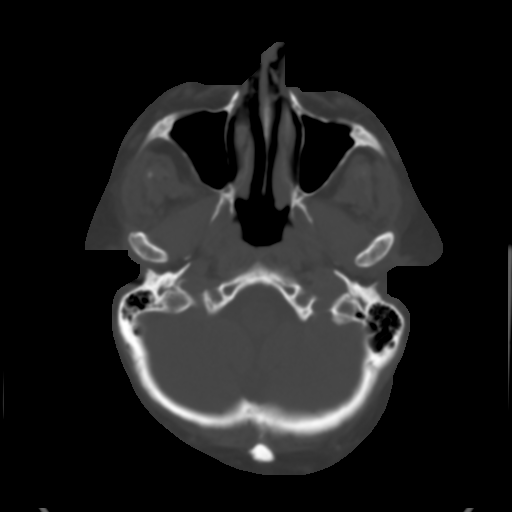
[im 9/33  brain]
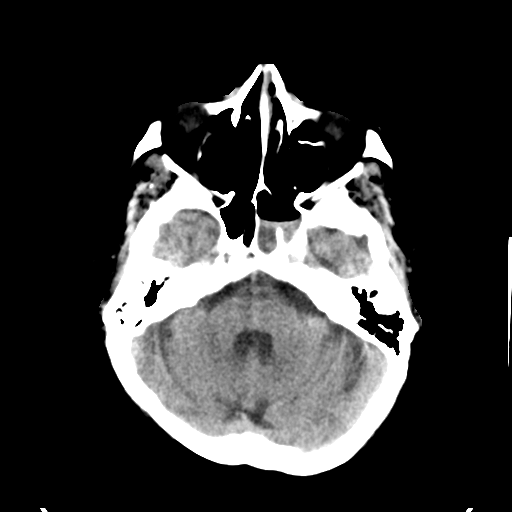
[im 13/33  brain]
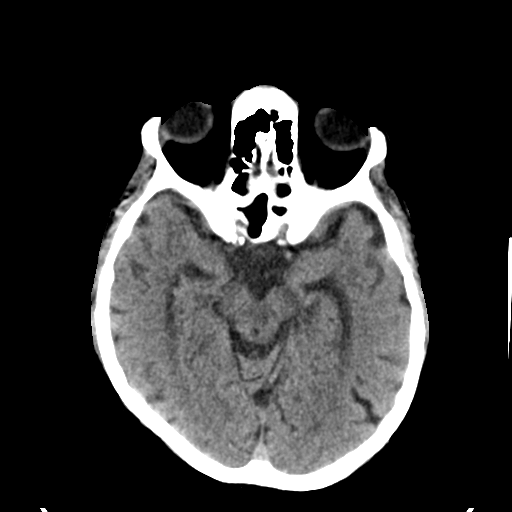
[im 17/33  brain]
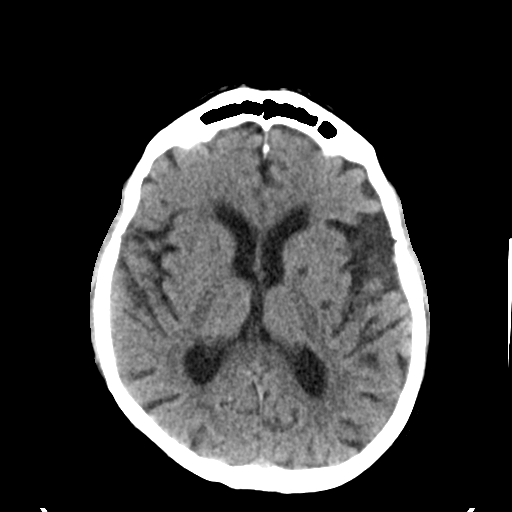
[im 21/33  brain]
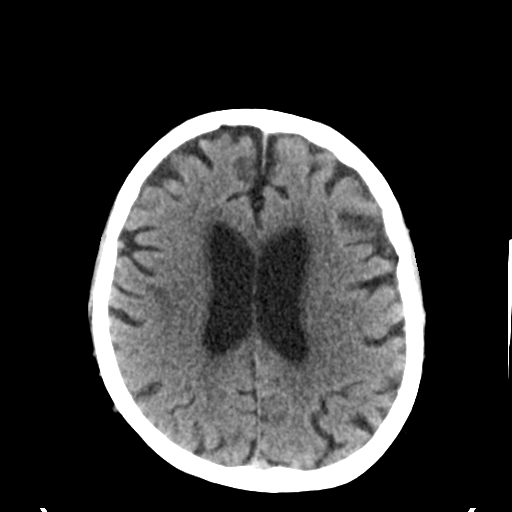
[im 21/33  bone]
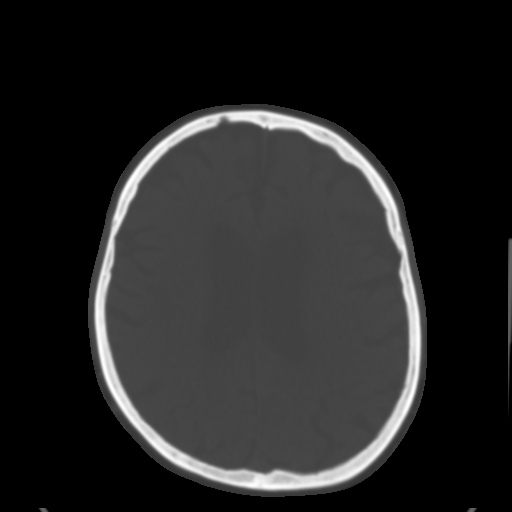
[im 25/33  brain]
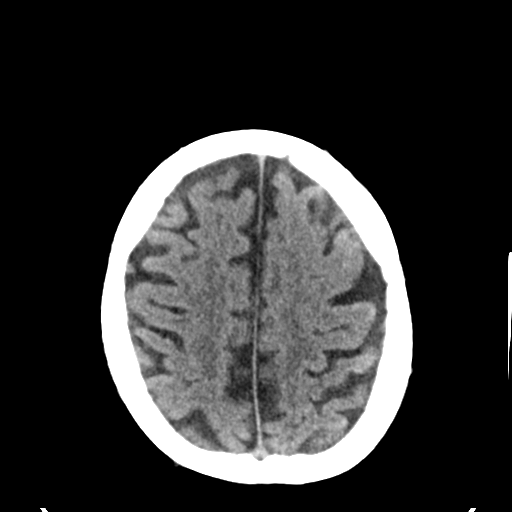
[im 29/33  brain]
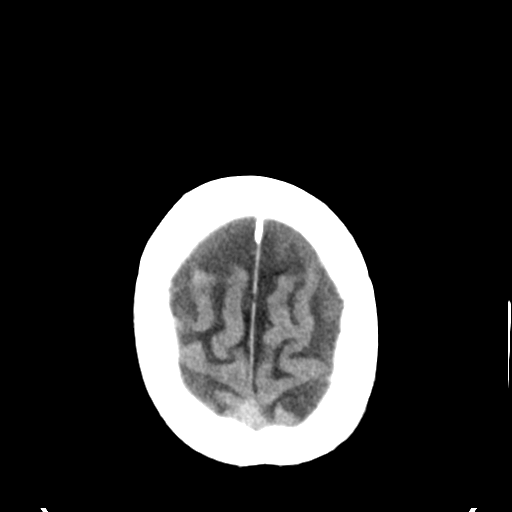

[Series 4: head bone · axial · 0.48mm/px · z∈[-37,+19]mm · 4 of 82 slices shown]
[im 9/82  bone]
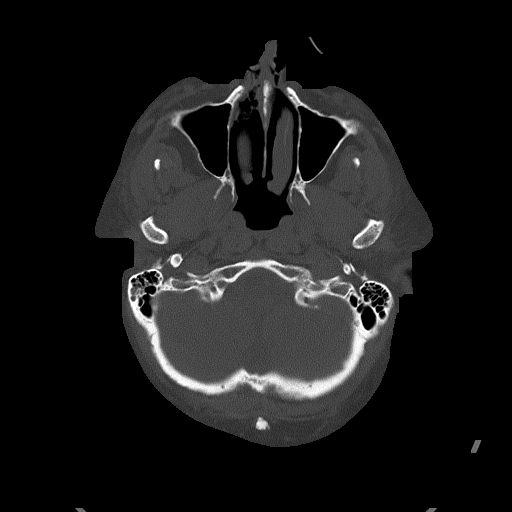
[im 17/82  bone]
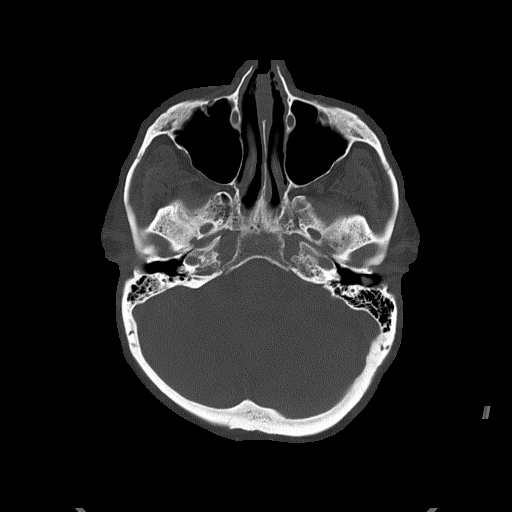
[im 25/82  bone]
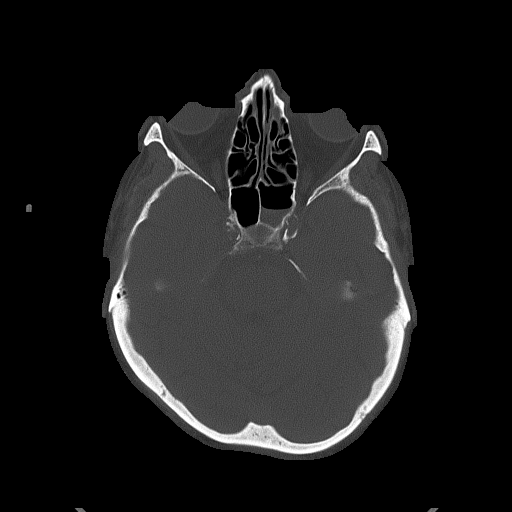
[im 37/82  bone]
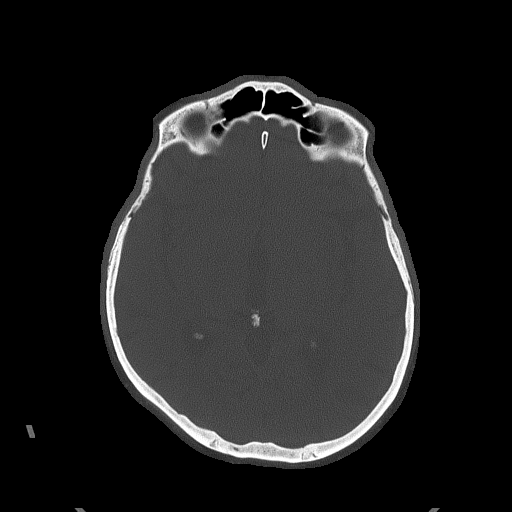

[Series 5: head without cor · coronal · non-contrast · 0.35mm/px · 3 of 75 slices shown]
[im 25/75  brain]
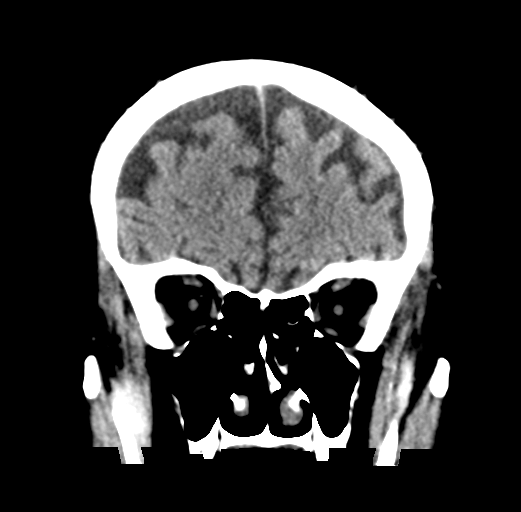
[im 33/75  brain]
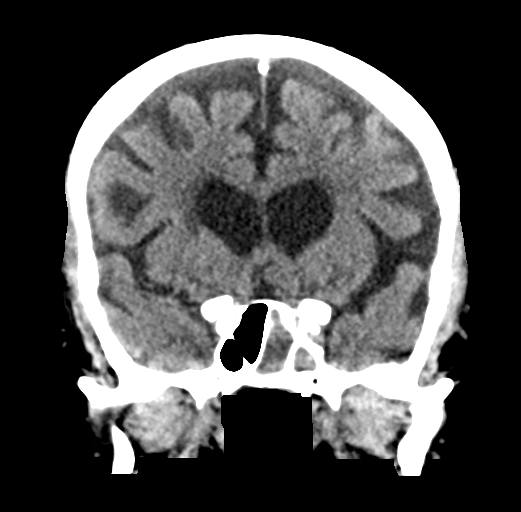
[im 42/75  brain]
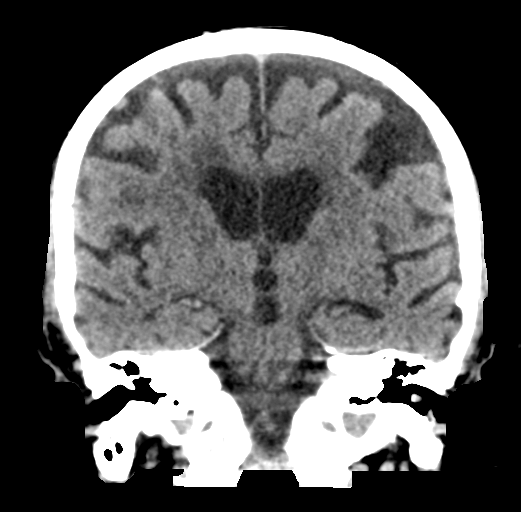

[Series 6: head without sag · sagittal · non-contrast · 0.35mm/px · 3 of 67 slices shown]
[im 23/67  brain]
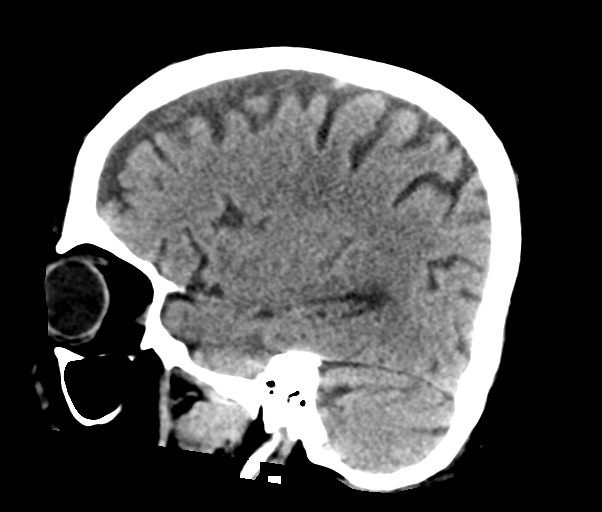
[im 34/67  brain]
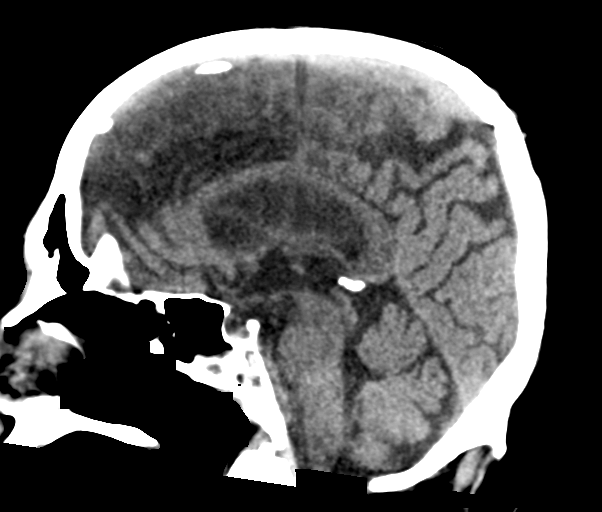
[im 45/67  brain]
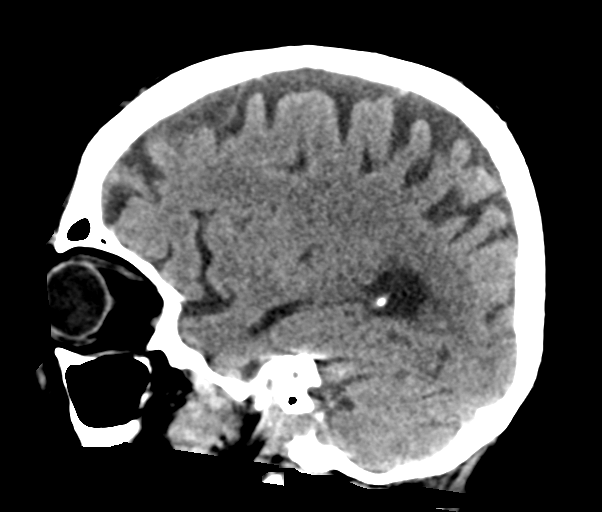

[17 of 47 positions shown; findings below may reference images not displayed]

FINDINGS: Brain: No evidence of acute infarction, hemorrhage, hydrocephalus,
extra-axial collection or mass lesion/mass effect. Chronic small
vessel ischemic change.

Vascular: No hyperdense vessel or unexpected calcification.

Skull: Normal. Negative for fracture or focal lesion.

Sinuses/Orbits: Air-fluid level in the left sphenoid sinus. No acute
finding.

Other: None.
IMPRESSION: No acute intracranial abnormality.

## 2022-06-28 IMAGING — DX DG CHEST 1V PORT
1 series · 1 of 1 positions shown · non-contrast
Comparison: Chest radiograph 03/22/2021, chest CT 03/22/2021

CLINICAL DATA: Fall

EXAM:
PORTABLE CHEST 1 VIEW

[chest]
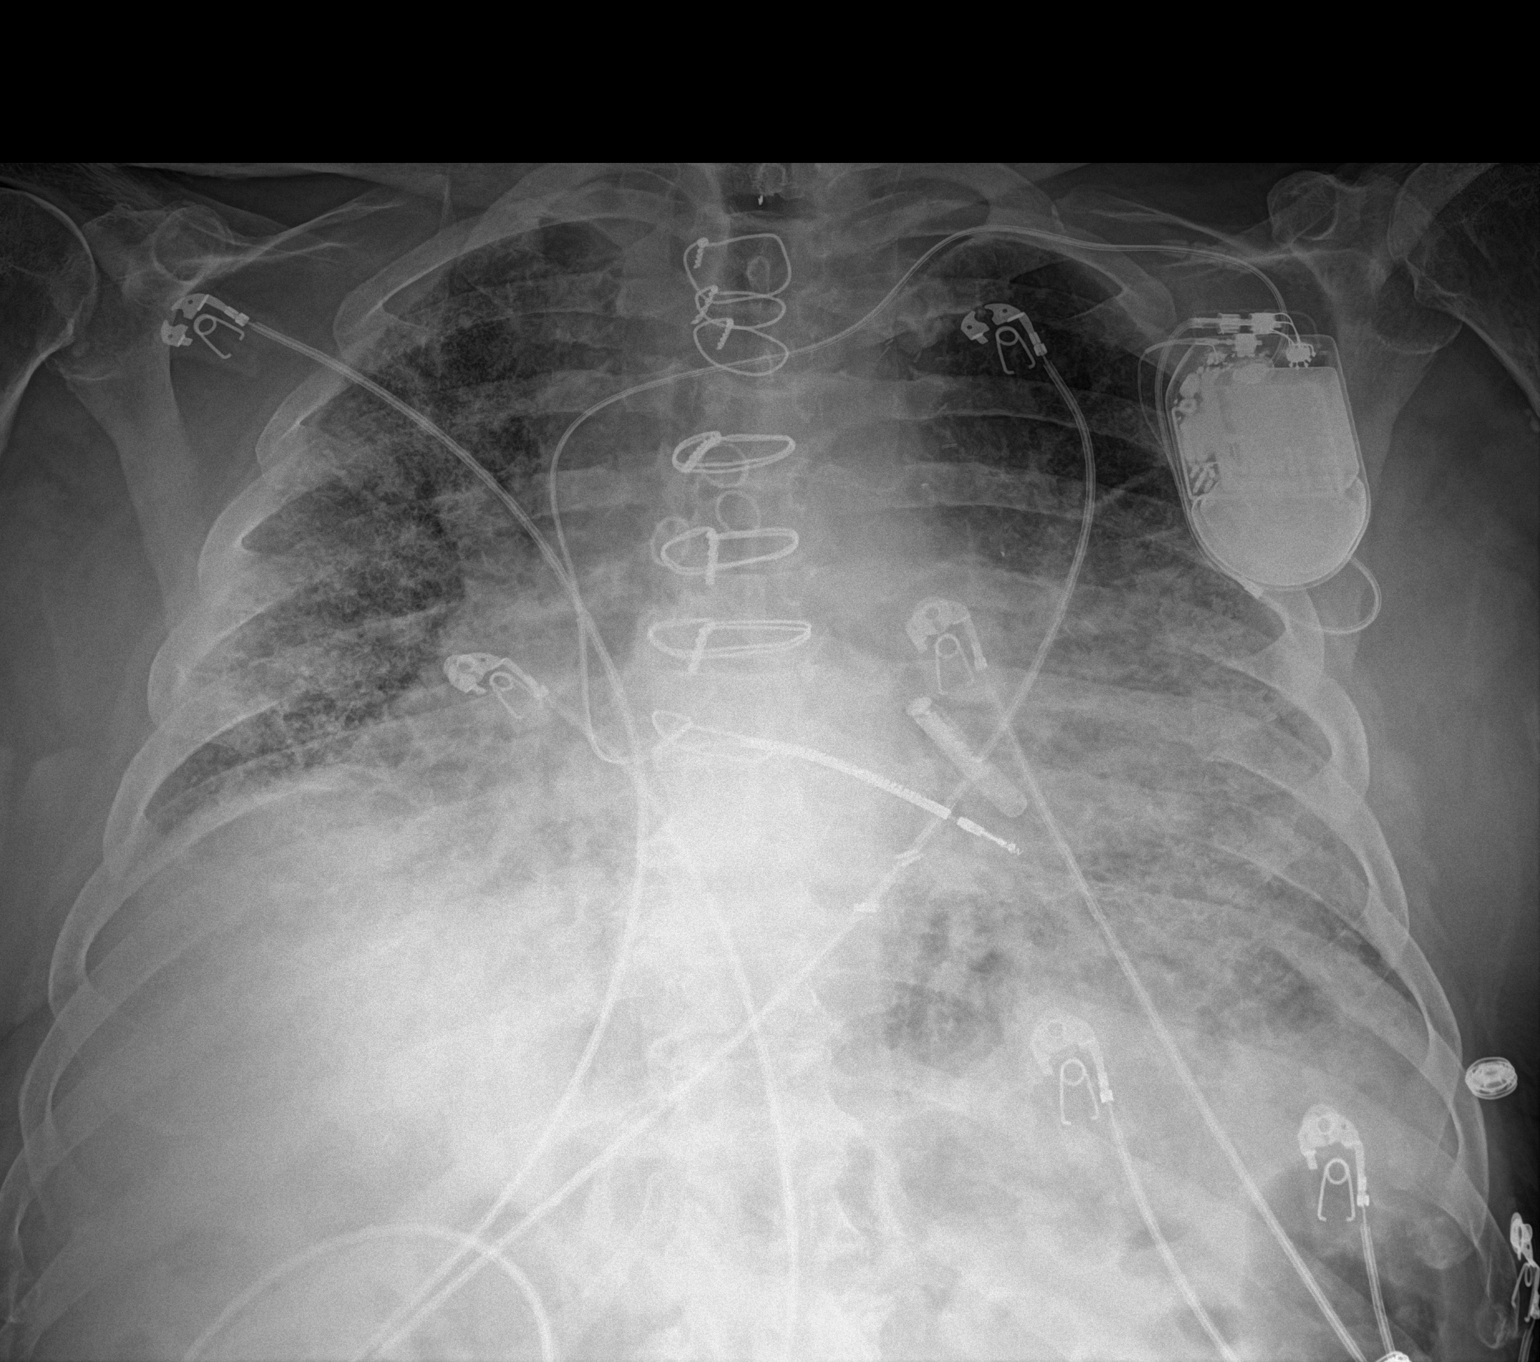

[1 of 1 positions shown; findings below may reference images not displayed]

FINDINGS: Unchanged, enlarged cardiac silhouette with prior median sternotomy,
postsurgical changes of CABG, single lead AICD, and cardiac loop
recorder overlying the left medial lower chest. Changes of chronic
fibrotic interstitial lung disease with increased opacities
throughout the right lung. There is no acute osseous abnormality.
Thoracic spondylosis.
IMPRESSION: Chronic fibrotic interstitial lung disease with increased opacities
throughout the right lung, which can be seen with superimposed edema
or exacerbation of interstitial lung disease.

Unchanged enlarged cardiac silhouette.
